# Patient Record
Sex: Female | Born: 1935 | Race: White | Hispanic: No | Smoking: Never smoker
Health system: Southern US, Community
[De-identification: ages and names within clinical notes are randomized; demographics above are authoritative.]

## PROBLEM LIST (undated history)

## (undated) DIAGNOSIS — I10 Essential (primary) hypertension: Secondary | ICD-10-CM

## (undated) DIAGNOSIS — S329XXA Fracture of unspecified parts of lumbosacral spine and pelvis, initial encounter for closed fracture: Secondary | ICD-10-CM

## (undated) DIAGNOSIS — Z90722 Acquired absence of ovaries, bilateral: Secondary | ICD-10-CM

## (undated) DIAGNOSIS — G9340 Encephalopathy, unspecified: Secondary | ICD-10-CM

## (undated) DIAGNOSIS — R159 Full incontinence of feces: Secondary | ICD-10-CM

## (undated) DIAGNOSIS — I639 Cerebral infarction, unspecified: Secondary | ICD-10-CM

## (undated) DIAGNOSIS — K286 Chronic or unspecified gastrojejunal ulcer with both hemorrhage and perforation: Secondary | ICD-10-CM

## (undated) DIAGNOSIS — N210 Calculus in bladder: Secondary | ICD-10-CM

## (undated) DIAGNOSIS — M4800 Spinal stenosis, site unspecified: Secondary | ICD-10-CM

## (undated) DIAGNOSIS — R42 Dizziness and giddiness: Secondary | ICD-10-CM

## (undated) DIAGNOSIS — H9193 Unspecified hearing loss, bilateral: Secondary | ICD-10-CM

## (undated) DIAGNOSIS — R9439 Abnormal result of other cardiovascular function study: Secondary | ICD-10-CM

## (undated) DIAGNOSIS — R4182 Altered mental status, unspecified: Secondary | ICD-10-CM

## (undated) DIAGNOSIS — M87051 Idiopathic aseptic necrosis of right femur: Secondary | ICD-10-CM

## (undated) DIAGNOSIS — Z86010 Personal history of colonic polyps: Secondary | ICD-10-CM

## (undated) DIAGNOSIS — Z90710 Acquired absence of both cervix and uterus: Secondary | ICD-10-CM

## (undated) DIAGNOSIS — N179 Acute kidney failure, unspecified: Secondary | ICD-10-CM

## (undated) DIAGNOSIS — S32599A Other specified fracture of unspecified pubis, initial encounter for closed fracture: Secondary | ICD-10-CM

## (undated) DIAGNOSIS — H8110 Benign paroxysmal vertigo, unspecified ear: Secondary | ICD-10-CM

## (undated) DIAGNOSIS — Z862 Personal history of diseases of the blood and blood-forming organs and certain disorders involving the immune mechanism: Secondary | ICD-10-CM

## (undated) DIAGNOSIS — Z8673 Personal history of transient ischemic attack (TIA), and cerebral infarction without residual deficits: Secondary | ICD-10-CM

## (undated) DIAGNOSIS — M48061 Spinal stenosis, lumbar region without neurogenic claudication: Secondary | ICD-10-CM

## (undated) DIAGNOSIS — G2581 Restless legs syndrome: Secondary | ICD-10-CM

## (undated) DIAGNOSIS — G459 Transient cerebral ischemic attack, unspecified: Secondary | ICD-10-CM

## (undated) DIAGNOSIS — M48062 Spinal stenosis, lumbar region with neurogenic claudication: Secondary | ICD-10-CM

## (undated) DIAGNOSIS — F439 Reaction to severe stress, unspecified: Secondary | ICD-10-CM

## (undated) DIAGNOSIS — K635 Polyp of colon: Secondary | ICD-10-CM

## (undated) DIAGNOSIS — M1990 Unspecified osteoarthritis, unspecified site: Secondary | ICD-10-CM

## (undated) DIAGNOSIS — E876 Hypokalemia: Secondary | ICD-10-CM

## (undated) DIAGNOSIS — D649 Anemia, unspecified: Secondary | ICD-10-CM

## (undated) DIAGNOSIS — F419 Anxiety disorder, unspecified: Secondary | ICD-10-CM

## (undated) DIAGNOSIS — Z9079 Acquired absence of other genital organ(s): Secondary | ICD-10-CM

## (undated) DIAGNOSIS — I350 Nonrheumatic aortic (valve) stenosis: Secondary | ICD-10-CM

## (undated) HISTORY — DX: Nonrheumatic aortic (valve) stenosis: I35.0

## (undated) HISTORY — DX: Personal history of colonic polyps: Z86.010

## (undated) HISTORY — DX: Acquired absence of ovaries, bilateral: Z90.722

## (undated) HISTORY — DX: Transient cerebral ischemic attack, unspecified: G45.9

## (undated) HISTORY — DX: Altered mental status, unspecified: R41.82

## (undated) HISTORY — DX: Fracture of unspecified parts of lumbosacral spine and pelvis, initial encounter for closed fracture: S32.9XXA

## (undated) HISTORY — DX: Abnormal result of other cardiovascular function study: R94.39

## (undated) HISTORY — DX: Other specified fracture of unspecified pubis, initial encounter for closed fracture: S32.599A

## (undated) HISTORY — DX: Dizziness and giddiness: R42

## (undated) HISTORY — DX: Polyp of colon: K63.5

## (undated) HISTORY — DX: Acute kidney failure, unspecified: N17.9

## (undated) HISTORY — DX: Reaction to severe stress, unspecified: F43.9

## (undated) HISTORY — DX: Personal history of transient ischemic attack (TIA), and cerebral infarction without residual deficits: Z86.73

## (undated) HISTORY — DX: Anemia, unspecified: D64.9

## (undated) HISTORY — DX: Acquired absence of other genital organ(s): Z90.79

## (undated) HISTORY — DX: Encephalopathy, unspecified: G93.40

## (undated) HISTORY — DX: Benign paroxysmal vertigo, unspecified ear: H81.10

## (undated) HISTORY — DX: Unspecified hearing loss, bilateral: H91.93

## (undated) HISTORY — DX: Personal history of diseases of the blood and blood-forming organs and certain disorders involving the immune mechanism: Z86.2

## (undated) HISTORY — DX: Acquired absence of both cervix and uterus: Z90.710

## (undated) HISTORY — PX: UTERINE FIBROID SURGERY: SHX826

## (undated) HISTORY — PX: TONSILLECTOMY: SUR1361

## (undated) HISTORY — DX: Anxiety disorder, unspecified: F41.9

## (undated) HISTORY — DX: Hypokalemia: E87.6

## (undated) HISTORY — PX: COLON RESECTION: SHX5231

## (undated) HISTORY — DX: Spinal stenosis, site unspecified: M48.00

## (undated) HISTORY — DX: Idiopathic aseptic necrosis of right femur: M87.051

## (undated) HISTORY — DX: Spinal stenosis, lumbar region with neurogenic claudication: M48.062

## (undated) HISTORY — DX: Spinal stenosis, lumbar region without neurogenic claudication: M48.061

---

## 2007-07-23 ENCOUNTER — Encounter: Admission: RE | Admit: 2007-07-23 | Discharge: 2007-07-23 | Payer: Self-pay | Admitting: Internal Medicine

## 2008-12-01 ENCOUNTER — Encounter: Admission: RE | Admit: 2008-12-01 | Discharge: 2008-12-01 | Payer: Self-pay | Admitting: Orthopaedic Surgery

## 2009-11-13 ENCOUNTER — Ambulatory Visit: Payer: Self-pay | Admitting: Cardiology

## 2009-11-13 ENCOUNTER — Inpatient Hospital Stay (HOSPITAL_COMMUNITY): Admission: EM | Admit: 2009-11-13 | Discharge: 2009-11-15 | Payer: Self-pay | Admitting: Emergency Medicine

## 2009-11-14 ENCOUNTER — Encounter (INDEPENDENT_AMBULATORY_CARE_PROVIDER_SITE_OTHER): Payer: Self-pay | Admitting: Internal Medicine

## 2010-01-03 ENCOUNTER — Encounter: Admission: RE | Admit: 2010-01-03 | Discharge: 2010-01-03 | Payer: Self-pay | Admitting: Internal Medicine

## 2010-08-11 LAB — CK TOTAL AND CKMB (NOT AT ARMC)
CK, MB: 2.5 ng/mL (ref 0.3–4.0)
Relative Index: INVALID (ref 0.0–2.5)
Total CK: 75 U/L (ref 7–177)

## 2010-08-11 LAB — CBC
HCT: 37 % (ref 36.0–46.0)
Hemoglobin: 12.5 g/dL (ref 12.0–15.0)
MCV: 93.9 fL (ref 78.0–100.0)
Platelets: 232 10*3/uL (ref 150–400)
RBC: 3.95 MIL/uL (ref 3.87–5.11)
RDW: 13.7 % (ref 11.5–15.5)
WBC: 9.7 10*3/uL (ref 4.0–10.5)

## 2010-08-11 LAB — PROTIME-INR: INR: 0.97 (ref 0.00–1.49)

## 2010-08-11 LAB — TROPONIN I: Troponin I: 0.02 ng/mL (ref 0.00–0.06)

## 2010-08-11 LAB — DIFFERENTIAL
Basophils Relative: 0 % (ref 0–1)
Monocytes Absolute: 0.4 10*3/uL (ref 0.1–1.0)
Monocytes Relative: 4 % (ref 3–12)
Neutro Abs: 7.8 10*3/uL — ABNORMAL HIGH (ref 1.7–7.7)

## 2010-08-11 LAB — APTT: aPTT: 29 seconds (ref 24–37)

## 2010-08-11 LAB — COMPREHENSIVE METABOLIC PANEL
Alkaline Phosphatase: 126 U/L — ABNORMAL HIGH (ref 39–117)
CO2: 30 mEq/L (ref 19–32)
Chloride: 101 mEq/L (ref 96–112)
GFR calc Af Amer: >60 mL/min (ref >60)
Potassium: 4.5 mEq/L (ref 3.5–5.1)
Sodium: 138 mEq/L (ref 135–145)
Total Bilirubin: 0.5 mg/dL (ref 0.3–1.2)
Total Protein: 6.2 g/dL (ref 6.0–8.3)

## 2010-08-11 LAB — URINALYSIS, ROUTINE W REFLEX MICROSCOPIC
Glucose, UA: NEGATIVE mg/dL
Nitrite: NEGATIVE
Protein, ur: NEGATIVE mg/dL
Specific Gravity, Urine: 1.019 (ref 1.005–1.030)
Urobilinogen, UA: 0.2 mg/dL (ref 0.0–1.0)

## 2010-08-11 LAB — MAGNESIUM: Magnesium: 2.1 mg/dL (ref 1.5–2.5)

## 2010-08-11 LAB — HEMOGLOBIN A1C: Hgb A1c MFr Bld: 6.3 % — ABNORMAL HIGH (ref <5.7)

## 2010-08-11 LAB — LIPID PANEL
Total CHOL/HDL Ratio: 3.7 RATIO
Triglycerides: 139 mg/dL (ref <150)

## 2010-11-20 ENCOUNTER — Emergency Department (HOSPITAL_COMMUNITY)
Admission: EM | Admit: 2010-11-20 | Discharge: 2010-11-20 | Disposition: A | Discharge status: Home / Self Care | Payer: Medicare Other | Attending: Emergency Medicine | Admitting: Emergency Medicine

## 2010-11-20 ENCOUNTER — Emergency Department (HOSPITAL_COMMUNITY): Payer: Medicare Other

## 2010-11-20 DIAGNOSIS — R42 Dizziness and giddiness: Secondary | ICD-10-CM | POA: Insufficient documentation

## 2010-11-20 DIAGNOSIS — R51 Headache: Secondary | ICD-10-CM | POA: Insufficient documentation

## 2010-11-20 DIAGNOSIS — Z8673 Personal history of transient ischemic attack (TIA), and cerebral infarction without residual deficits: Secondary | ICD-10-CM | POA: Insufficient documentation

## 2010-11-20 DIAGNOSIS — Z79899 Other long term (current) drug therapy: Secondary | ICD-10-CM | POA: Insufficient documentation

## 2010-11-20 DIAGNOSIS — I10 Essential (primary) hypertension: Secondary | ICD-10-CM | POA: Insufficient documentation

## 2010-11-20 DIAGNOSIS — H5500 Unspecified nystagmus: Secondary | ICD-10-CM | POA: Insufficient documentation

## 2010-11-20 DIAGNOSIS — Z9889 Other specified postprocedural states: Secondary | ICD-10-CM | POA: Insufficient documentation

## 2010-11-20 LAB — COMPREHENSIVE METABOLIC PANEL
ALT: 15 U/L (ref 0–35)
AST: 16 U/L (ref 0–37)
Albumin: 3.5 g/dL (ref 3.5–5.2)
Alkaline Phosphatase: 85 U/L (ref 39–117)
Glucose, Bld: 105 mg/dL — ABNORMAL HIGH (ref 70–99)
Potassium: 4 mEq/L (ref 3.5–5.1)
Sodium: 141 mEq/L (ref 135–145)
Total Protein: 6.2 g/dL (ref 6.0–8.3)

## 2010-11-20 LAB — URINALYSIS, ROUTINE W REFLEX MICROSCOPIC
Bilirubin Urine: NEGATIVE
Glucose, UA: NEGATIVE mg/dL
Hgb urine dipstick: NEGATIVE
Leukocytes, UA: NEGATIVE
Nitrite: NEGATIVE
Specific Gravity, Urine: 1.009 (ref 1.005–1.030)
pH: 7.5 (ref 5.0–8.0)

## 2010-11-20 LAB — CBC
MCHC: 33.4 g/dL (ref 30.0–36.0)
MCV: 88 fL (ref 78.0–100.0)
RBC: 4.32 MIL/uL (ref 3.87–5.11)
RDW: 13.6 % (ref 11.5–15.5)
WBC: 6.4 10*3/uL (ref 4.0–10.5)

## 2010-11-20 LAB — DIFFERENTIAL
Basophils Relative: 1 % (ref 0–1)
Eosinophils Relative: 3 % (ref 0–5)
Monocytes Absolute: 0.5 10*3/uL (ref 0.1–1.0)
Monocytes Relative: 8 % (ref 3–12)

## 2010-11-20 LAB — CK TOTAL AND CKMB (NOT AT ARMC)
CK, MB: 3.1 ng/mL (ref 0.3–4.0)
Relative Index: INVALID (ref 0.0–2.5)

## 2010-11-20 LAB — TROPONIN I: Troponin I: <0.3 ng/mL (ref <0.30)

## 2010-11-21 LAB — URINE CULTURE
Colony Count: NO GROWTH
Culture: NO GROWTH

## 2011-01-21 ENCOUNTER — Other Ambulatory Visit: Payer: Self-pay | Admitting: Neurosurgery

## 2011-01-21 DIAGNOSIS — IMO0002 Reserved for concepts with insufficient information to code with codable children: Secondary | ICD-10-CM

## 2011-01-21 DIAGNOSIS — M48061 Spinal stenosis, lumbar region without neurogenic claudication: Secondary | ICD-10-CM

## 2011-01-21 DIAGNOSIS — M479 Spondylosis, unspecified: Secondary | ICD-10-CM

## 2011-01-21 DIAGNOSIS — M792 Neuralgia and neuritis, unspecified: Secondary | ICD-10-CM

## 2011-01-21 DIAGNOSIS — M545 Low back pain: Secondary | ICD-10-CM

## 2011-01-26 ENCOUNTER — Ambulatory Visit
Admission: RE | Admit: 2011-01-26 | Discharge: 2011-01-26 | Disposition: A | Discharge status: Home / Self Care | Payer: Medicare Other | Source: Ambulatory Visit | Attending: Neurosurgery | Admitting: Neurosurgery

## 2011-01-26 DIAGNOSIS — M545 Low back pain, unspecified: Secondary | ICD-10-CM

## 2011-01-26 DIAGNOSIS — M792 Neuralgia and neuritis, unspecified: Secondary | ICD-10-CM

## 2011-01-26 DIAGNOSIS — M479 Spondylosis, unspecified: Secondary | ICD-10-CM

## 2011-01-26 DIAGNOSIS — M48061 Spinal stenosis, lumbar region without neurogenic claudication: Secondary | ICD-10-CM

## 2011-01-26 DIAGNOSIS — IMO0002 Reserved for concepts with insufficient information to code with codable children: Secondary | ICD-10-CM

## 2011-01-30 ENCOUNTER — Other Ambulatory Visit: Payer: Medicare Other

## 2011-02-05 ENCOUNTER — Other Ambulatory Visit: Payer: Self-pay | Admitting: Internal Medicine

## 2011-02-06 ENCOUNTER — Other Ambulatory Visit: Payer: Medicare Other

## 2011-02-06 ENCOUNTER — Ambulatory Visit
Admission: RE | Admit: 2011-02-06 | Discharge: 2011-02-06 | Disposition: A | Discharge status: Home / Self Care | Payer: Medicare Other | Source: Ambulatory Visit | Attending: Internal Medicine | Admitting: Internal Medicine

## 2011-02-06 MED ORDER — IOHEXOL 300 MG/ML  SOLN
100.0000 mL | Freq: Once | INTRAMUSCULAR | Status: AC | PRN
  Administered 2011-02-06: 100 mL via INTRAVENOUS

## 2011-06-02 ENCOUNTER — Other Ambulatory Visit: Payer: Self-pay | Admitting: Neurosurgery

## 2011-06-02 DIAGNOSIS — M47817 Spondylosis without myelopathy or radiculopathy, lumbosacral region: Secondary | ICD-10-CM

## 2011-06-02 DIAGNOSIS — M48061 Spinal stenosis, lumbar region without neurogenic claudication: Secondary | ICD-10-CM

## 2011-06-02 DIAGNOSIS — M4310 Spondylolisthesis, site unspecified: Secondary | ICD-10-CM

## 2011-06-05 ENCOUNTER — Ambulatory Visit
Admission: RE | Admit: 2011-06-05 | Discharge: 2011-06-05 | Disposition: A | Discharge status: Home / Self Care | Payer: Medicare Other | Source: Ambulatory Visit | Attending: Neurosurgery | Admitting: Neurosurgery

## 2011-06-05 DIAGNOSIS — M48061 Spinal stenosis, lumbar region without neurogenic claudication: Secondary | ICD-10-CM

## 2011-06-05 DIAGNOSIS — M47817 Spondylosis without myelopathy or radiculopathy, lumbosacral region: Secondary | ICD-10-CM

## 2011-06-05 DIAGNOSIS — M4310 Spondylolisthesis, site unspecified: Secondary | ICD-10-CM

## 2011-06-07 ENCOUNTER — Ambulatory Visit (INDEPENDENT_AMBULATORY_CARE_PROVIDER_SITE_OTHER): Payer: Medicare Other

## 2011-06-07 DIAGNOSIS — N390 Urinary tract infection, site not specified: Secondary | ICD-10-CM

## 2011-06-17 ENCOUNTER — Encounter (HOSPITAL_COMMUNITY): Payer: Self-pay | Admitting: Respiratory Therapy

## 2011-06-17 ENCOUNTER — Other Ambulatory Visit: Payer: Self-pay | Admitting: Neurosurgery

## 2011-06-20 ENCOUNTER — Encounter (HOSPITAL_COMMUNITY)
Admission: RE | Admit: 2011-06-20 | Discharge: 2011-06-20 | Disposition: A | Discharge status: Home / Self Care | Payer: Medicare Other | Source: Ambulatory Visit | Attending: Neurosurgery | Admitting: Neurosurgery

## 2011-06-20 ENCOUNTER — Other Ambulatory Visit: Payer: Self-pay

## 2011-06-20 ENCOUNTER — Encounter (HOSPITAL_COMMUNITY): Payer: Self-pay

## 2011-06-20 ENCOUNTER — Ambulatory Visit (HOSPITAL_COMMUNITY)
Admission: RE | Admit: 2011-06-20 | Discharge: 2011-06-20 | Disposition: A | Discharge status: Home / Self Care | Payer: Medicare Other | Source: Ambulatory Visit | Attending: Anesthesiology | Admitting: Anesthesiology

## 2011-06-20 DIAGNOSIS — Z01810 Encounter for preprocedural cardiovascular examination: Secondary | ICD-10-CM | POA: Insufficient documentation

## 2011-06-20 DIAGNOSIS — Z01818 Encounter for other preprocedural examination: Secondary | ICD-10-CM | POA: Insufficient documentation

## 2011-06-20 DIAGNOSIS — Z01812 Encounter for preprocedural laboratory examination: Secondary | ICD-10-CM | POA: Insufficient documentation

## 2011-06-20 HISTORY — DX: Chronic or unspecified gastrojejunal ulcer with both hemorrhage and perforation: K28.6

## 2011-06-20 HISTORY — DX: Restless legs syndrome: G25.81

## 2011-06-20 HISTORY — DX: Essential (primary) hypertension: I10

## 2011-06-20 HISTORY — DX: Cerebral infarction, unspecified: I63.9

## 2011-06-20 HISTORY — DX: Unspecified osteoarthritis, unspecified site: M19.90

## 2011-06-20 HISTORY — DX: Full incontinence of feces: R15.9

## 2011-06-20 HISTORY — DX: Calculus in bladder: N21.0

## 2011-06-20 LAB — CBC
HCT: 40.7 % (ref 36.0–46.0)
Hemoglobin: 13.6 g/dL (ref 12.0–15.0)
MCHC: 33.4 g/dL (ref 30.0–36.0)
RBC: 4.41 MIL/uL (ref 3.87–5.11)
WBC: 12 10*3/uL — ABNORMAL HIGH (ref 4.0–10.5)

## 2011-06-20 LAB — SURGICAL PCR SCREEN
MRSA, PCR: NEGATIVE
Staphylococcus aureus: NEGATIVE

## 2011-06-20 LAB — BASIC METABOLIC PANEL
BUN: 28 mg/dL — ABNORMAL HIGH (ref 6–23)
Calcium: 9.1 mg/dL (ref 8.4–10.5)
Creatinine, Ser: 0.75 mg/dL (ref 0.50–1.10)
GFR calc Af Amer: 83 mL/min — ABNORMAL LOW (ref >90)

## 2011-06-20 NOTE — Pre-Procedure Instructions (Signed)
20 Shelly Martin  06/20/2011   Your procedure is scheduled on:  Jaunary 30th   Report to [facility] at 1:30  PM.  Call this number if you have problems the morning of surgery: [phone]   Remember:   Do not eat food:After Midnight.  May have clear liquids: up to 4 Hours before arrival.  Clear liquids include soda, tea, black coffee, apple or grape juice, broth.  Take these medicines the morning of surgery with A SIP OF WATER:  Amolodipine (Norvasc), Acetaminohen.   Do not wear jewelry, make-up or nail polish.  Do not wear lotions, powders, or perfumes. You may wear deodorant.  Do not shave 48 hours prior to surgery.  Do not bring valuables to the hospital.  Contacts, dentures or bridgework may not be worn into surgery.  Leave suitcase in the car. After surgery it may be brought to your room.  For patients admitted to the hospital, checkout time is 11:00 AM the day of discharge.   Patients discharged the day of surgery will not be allowed to drive home.  Name and phone number of your driver: ---  Special Instructions: CHG Shower Use Special Wash: 1/2 bottle night before surgery and 1/2 bottle morning of surgery.   Please read over the following fact sheets that you were given: Pain Booklet, Coughing and Deep Breathing, MRSA Information and Surgical Site Infection Prevention

## 2011-06-23 NOTE — Consult Note (Addendum)
Anesthesia:  Patient is a 78 year old female scheduled for a right L 2-3 hemilaminectomy and microdiskectomy on 06/25/11.  Her history includes HTN, GIB, arthritis, bowel and bladder incontinence, colon resection, RLS, and TIA 10/2009.   Labs noted.  CXR findings showed cardiomediastinal silhouette is stable.  No acute infiltrate or pleural effusion. No pulmonary edema. Mild degenerative changes right shoulder. Mild degenerative changes mid thoracic spine. Stable central chronic mild bronchitic changes.  EKG from 06/20/11 shows NSR, possible LAE, LAFB, LVH.  Overall, it appears stable since 11/20/10.    Echo on 11/14/09 showed: "- Left ventricle: The cavity size was normal. Wall thickness was normal. Systolic function was normal. The estimated ejection fraction was in the range of 60% to 65%. Wall motion was normal; there were no regional wall motion abnormalities. Doppler parameters are consistent with abnormal left ventricular relaxation (grade 1 diastolic dysfunction). - Aortic valve: There was mild stenosis. Valve area: 1.06cm^2(VTI).Valve area: 1.05cm^2 (Vmax). - Mitral valve: Calcified annulus. Valve area by pressure half-time: 1.96cm^2. Valve area by continuity equation (using LVOT flow): 1.06cm^2. - Pulmonary arteries: Systolic pressure was mildly increased."  Carotid duplex on 11/13/09 showed no significant extracranial carotid artery stenosis, vertebrals patent with antegrade flow.  I received her last OV from Dr. Nehemiah Settle which was on 02/21/12 and was for f/u for constipation.  I reviewed her history, EKG, and echo findings with Anesthesiologist Dr. Noreene Larsson.  He is going to contact Dr. Newell Coral to discuss case further and potential need for pre-operative Cardiology evaluation d/t AS, abnormal EKG, and age.  Addendum:  06/24/11 1230  Dr. Noreene Larsson spoke with Dr. Jule Ser.  He has discussed potential operative risks with the patient, and reportedly she has accepted these risks and has refused  further Cardiac evaluation.  At this point, the plan is to proceed.  Dr. Noreene Larsson to update her assigned Anesthesiologist who will evaluate patient on the day of surgery, and discuss the definitive anesthesia plan at that time.

## 2011-06-24 MED ORDER — CEFAZOLIN SODIUM 1-5 GM-% IV SOLN
1.0000 g | INTRAVENOUS | Status: AC
  Administered 2011-06-25: 1 g via INTRAVENOUS
  Filled 2011-06-24: qty 50

## 2011-06-25 ENCOUNTER — Ambulatory Visit (HOSPITAL_COMMUNITY): Payer: Medicare Other

## 2011-06-25 ENCOUNTER — Inpatient Hospital Stay (HOSPITAL_COMMUNITY)
Admission: RE | Admit: 2011-06-25 | Discharge: 2011-06-26 | DRG: 491 | Disposition: A | Discharge status: Home / Self Care | Payer: Medicare Other | Source: Ambulatory Visit | Attending: Neurosurgery | Admitting: Neurosurgery

## 2011-06-25 ENCOUNTER — Encounter (HOSPITAL_COMMUNITY): Payer: Self-pay | Admitting: *Deleted

## 2011-06-25 ENCOUNTER — Encounter (HOSPITAL_COMMUNITY): Payer: Self-pay | Admitting: Vascular Surgery

## 2011-06-25 ENCOUNTER — Ambulatory Visit (HOSPITAL_COMMUNITY): Payer: Medicare Other | Admitting: Vascular Surgery

## 2011-06-25 ENCOUNTER — Encounter (HOSPITAL_COMMUNITY)
Admission: RE | Disposition: A | Discharge status: Home / Self Care | Payer: Self-pay | Source: Ambulatory Visit | Attending: Neurosurgery

## 2011-06-25 DIAGNOSIS — K5900 Constipation, unspecified: Secondary | ICD-10-CM | POA: Diagnosis present

## 2011-06-25 DIAGNOSIS — Z8673 Personal history of transient ischemic attack (TIA), and cerebral infarction without residual deficits: Secondary | ICD-10-CM

## 2011-06-25 DIAGNOSIS — G2581 Restless legs syndrome: Secondary | ICD-10-CM | POA: Diagnosis present

## 2011-06-25 DIAGNOSIS — I10 Essential (primary) hypertension: Secondary | ICD-10-CM | POA: Diagnosis present

## 2011-06-25 DIAGNOSIS — Z79899 Other long term (current) drug therapy: Secondary | ICD-10-CM

## 2011-06-25 DIAGNOSIS — M51379 Other intervertebral disc degeneration, lumbosacral region without mention of lumbar back pain or lower extremity pain: Secondary | ICD-10-CM | POA: Diagnosis present

## 2011-06-25 DIAGNOSIS — Z7982 Long term (current) use of aspirin: Secondary | ICD-10-CM

## 2011-06-25 DIAGNOSIS — M5126 Other intervertebral disc displacement, lumbar region: Principal | ICD-10-CM | POA: Diagnosis present

## 2011-06-25 DIAGNOSIS — M5137 Other intervertebral disc degeneration, lumbosacral region: Secondary | ICD-10-CM | POA: Diagnosis present

## 2011-06-25 DIAGNOSIS — M47817 Spondylosis without myelopathy or radiculopathy, lumbosacral region: Secondary | ICD-10-CM | POA: Diagnosis present

## 2011-06-25 HISTORY — PX: LUMBAR LAMINECTOMY/DECOMPRESSION MICRODISCECTOMY: SHX5026

## 2011-06-25 SURGERY — LUMBAR LAMINECTOMY/DECOMPRESSION MICRODISCECTOMY
Anesthesia: General | Site: Spine Lumbar | Laterality: Right | Wound class: Clean

## 2011-06-25 MED ORDER — KCL IN DEXTROSE-NACL 20-5-0.45 MEQ/L-%-% IV SOLN
INTRAVENOUS | Status: DC
  Administered 2011-06-26: 01:00:00 via INTRAVENOUS
  Filled 2011-06-25 (×4): qty 1000

## 2011-06-25 MED ORDER — FENTANYL CITRATE 0.05 MG/ML IJ SOLN
INTRAMUSCULAR | Status: DC | PRN
  Administered 2011-06-25: 100 ug via INTRAVENOUS
  Administered 2011-06-25 (×2): 50 ug via INTRAVENOUS

## 2011-06-25 MED ORDER — LACTATED RINGERS IV SOLN
INTRAVENOUS | Status: DC | PRN
  Administered 2011-06-25: 20:00:00 via INTRAVENOUS

## 2011-06-25 MED ORDER — MORPHINE SULFATE 4 MG/ML IJ SOLN: 4.0000 mg | INTRAMUSCULAR | Status: DC | PRN

## 2011-06-25 MED ORDER — HEMOSTATIC AGENTS (NO CHARGE) OPTIME
TOPICAL | Status: DC | PRN
  Administered 2011-06-25: 1 via TOPICAL

## 2011-06-25 MED ORDER — THROMBIN 5000 UNITS EX SOLR
CUTANEOUS | Status: DC | PRN
  Administered 2011-06-25 (×2): 5000 [IU] via TOPICAL

## 2011-06-25 MED ORDER — NEOSTIGMINE METHYLSULFATE 1 MG/ML IJ SOLN
INTRAMUSCULAR | Status: DC | PRN
  Administered 2011-06-25: 4 mg via INTRAVENOUS

## 2011-06-25 MED ORDER — HYDROMORPHONE HCL PF 1 MG/ML IJ SOLN
0.2500 mg | INTRAMUSCULAR | Status: DC | PRN
  Administered 2011-06-25: 0.5 mg via INTRAVENOUS

## 2011-06-25 MED ORDER — LIDOCAINE-EPINEPHRINE 1 %-1:100000 IJ SOLN
INTRAMUSCULAR | Status: DC | PRN
  Administered 2011-06-25: 20 mL

## 2011-06-25 MED ORDER — PHENYLEPHRINE HCL 10 MG/ML IJ SOLN
INTRAMUSCULAR | Status: DC | PRN
  Administered 2011-06-25: 80 ug via INTRAVENOUS

## 2011-06-25 MED ORDER — PROPOFOL 10 MG/ML IV BOLUS
INTRAVENOUS | Status: DC | PRN
  Administered 2011-06-25: 100 mg via INTRAVENOUS

## 2011-06-25 MED ORDER — KETOROLAC TROMETHAMINE 30 MG/ML IJ SOLN: 30.0000 mg | Freq: Four times a day (QID) | INTRAMUSCULAR | Status: DC

## 2011-06-25 MED ORDER — ONDANSETRON HCL 4 MG/2ML IJ SOLN
4.0000 mg | Freq: Once | INTRAMUSCULAR | Status: AC | PRN
  Administered 2011-06-25: 4 mg via INTRAVENOUS

## 2011-06-25 MED ORDER — ROPINIROLE HCL 1 MG PO TABS
2.0000 mg | ORAL_TABLET | Freq: Every day | ORAL | Status: DC
  Filled 2011-06-25 (×2): qty 2

## 2011-06-25 MED ORDER — FENTANYL CITRATE 0.05 MG/ML IJ SOLN
INTRAMUSCULAR | Status: DC | PRN
  Administered 2011-06-25: 25 ug via INTRAVENOUS

## 2011-06-25 MED ORDER — ONDANSETRON HCL 4 MG/2ML IJ SOLN
INTRAMUSCULAR | Status: AC
  Administered 2011-06-25: 4 mg via INTRAVENOUS
  Filled 2011-06-25: qty 2

## 2011-06-25 MED ORDER — KETOROLAC TROMETHAMINE 30 MG/ML IJ SOLN
INTRAMUSCULAR | Status: AC
  Administered 2011-06-25: 30 mg via INTRAVENOUS
  Filled 2011-06-25: qty 1

## 2011-06-25 MED ORDER — ZOLPIDEM TARTRATE 5 MG PO TABS: 5.0000 mg | ORAL_TABLET | Freq: Every evening | ORAL | Status: DC | PRN

## 2011-06-25 MED ORDER — KETOROLAC TROMETHAMINE 30 MG/ML IJ SOLN
30.0000 mg | Freq: Once | INTRAMUSCULAR | Status: AC
  Administered 2011-06-25: 30 mg via INTRAVENOUS

## 2011-06-25 MED ORDER — CYCLOBENZAPRINE HCL 10 MG PO TABS: 10.0000 mg | ORAL_TABLET | Freq: Three times a day (TID) | ORAL | Status: DC | PRN

## 2011-06-25 MED ORDER — AMLODIPINE BESYLATE 5 MG PO TABS
5.0000 mg | ORAL_TABLET | Freq: Every day | ORAL | Status: DC
  Administered 2011-06-26: 5 mg via ORAL
  Filled 2011-06-25: qty 1

## 2011-06-25 MED ORDER — BUPIVACAINE HCL (PF) 0.5 % IJ SOLN
INTRAMUSCULAR | Status: DC | PRN
  Administered 2011-06-25: 30 mL

## 2011-06-25 MED ORDER — HYDROCODONE-ACETAMINOPHEN 5-325 MG PO TABS
1.0000 | ORAL_TABLET | ORAL | Status: DC | PRN
  Administered 2011-06-26 (×2): 1 via ORAL
  Filled 2011-06-25 (×2): qty 1

## 2011-06-25 MED ORDER — ALUM & MAG HYDROXIDE-SIMETH 200-200-20 MG/5ML PO SUSP: 30.0000 mL | Freq: Four times a day (QID) | ORAL | Status: DC | PRN

## 2011-06-25 MED ORDER — MAGNESIUM HYDROXIDE 400 MG/5ML PO SUSP: 30.0000 mL | Freq: Every day | ORAL | Status: DC | PRN

## 2011-06-25 MED ORDER — HYDROXYZINE HCL 25 MG PO TABS: 50.0000 mg | ORAL_TABLET | ORAL | Status: DC | PRN

## 2011-06-25 MED ORDER — 0.9 % SODIUM CHLORIDE (POUR BTL) OPTIME
TOPICAL | Status: DC | PRN
  Administered 2011-06-25: 1000 mL

## 2011-06-25 MED ORDER — ONDANSETRON HCL 4 MG/2ML IJ SOLN
INTRAMUSCULAR | Status: DC | PRN
  Administered 2011-06-25: 4 mg via INTRAVENOUS

## 2011-06-25 MED ORDER — MENTHOL 3 MG MT LOZG: 1.0000 | LOZENGE | OROMUCOSAL | Status: DC | PRN

## 2011-06-25 MED ORDER — OXYCODONE-ACETAMINOPHEN 5-325 MG PO TABS: 1.0000 | ORAL_TABLET | ORAL | Status: DC | PRN

## 2011-06-25 MED ORDER — SODIUM CHLORIDE 0.9 % IV SOLN: 250.0000 mL | INTRAVENOUS | Status: DC

## 2011-06-25 MED ORDER — GLYCOPYRROLATE 0.2 MG/ML IJ SOLN
INTRAMUSCULAR | Status: DC | PRN
  Administered 2011-06-25: .5 mg via INTRAVENOUS

## 2011-06-25 MED ORDER — FENTANYL CITRATE 0.05 MG/ML IJ SOLN
INTRAMUSCULAR | Status: AC
  Filled 2011-06-25: qty 2

## 2011-06-25 MED ORDER — BISACODYL 10 MG RE SUPP: 10.0000 mg | Freq: Every day | RECTAL | Status: DC | PRN

## 2011-06-25 MED ORDER — SODIUM CHLORIDE 0.9 % IJ SOLN: 3.0000 mL | INTRAMUSCULAR | Status: DC | PRN

## 2011-06-25 MED ORDER — HYDROMORPHONE HCL PF 1 MG/ML IJ SOLN
INTRAMUSCULAR | Status: AC
  Administered 2011-06-25: 0.5 mg via INTRAVENOUS
  Filled 2011-06-25: qty 1

## 2011-06-25 MED ORDER — POLYETHYLENE GLYCOL 3350 17 G PO PACK
17.0000 g | PACK | Freq: Every day | ORAL | Status: DC
  Administered 2011-06-26: 17 g via ORAL
  Filled 2011-06-25 (×2): qty 1

## 2011-06-25 MED ORDER — SODIUM CHLORIDE 0.9 % IR SOLN
Status: DC | PRN
  Administered 2011-06-25: 21:00:00

## 2011-06-25 MED ORDER — METHYLPREDNISOLONE ACETATE PF 80 MG/ML IJ SUSP
INTRAMUSCULAR | Status: DC | PRN
  Administered 2011-06-25: 80 mg

## 2011-06-25 MED ORDER — ACETAMINOPHEN 325 MG PO TABS: 650.0000 mg | ORAL_TABLET | ORAL | Status: DC | PRN

## 2011-06-25 MED ORDER — BACITRACIN 50000 UNITS IM SOLR
INTRAMUSCULAR | Status: AC
  Filled 2011-06-25: qty 1

## 2011-06-25 MED ORDER — SODIUM CHLORIDE 0.9 % IV SOLN
INTRAVENOUS | Status: AC
  Filled 2011-06-25: qty 500

## 2011-06-25 MED ORDER — HYDROXYZINE HCL 50 MG/ML IM SOLN: 50.0000 mg | INTRAMUSCULAR | Status: DC | PRN

## 2011-06-25 MED ORDER — SODIUM CHLORIDE 0.9 % IJ SOLN
3.0000 mL | Freq: Two times a day (BID) | INTRAMUSCULAR | Status: DC
  Administered 2011-06-26: 3 mL via INTRAVENOUS

## 2011-06-25 MED ORDER — ACETAMINOPHEN 650 MG RE SUPP: 650.0000 mg | RECTAL | Status: DC | PRN

## 2011-06-25 MED ORDER — PHENOL 1.4 % MT LIQD: 1.0000 | OROMUCOSAL | Status: DC | PRN

## 2011-06-25 MED ORDER — ROCURONIUM BROMIDE 100 MG/10ML IV SOLN
INTRAVENOUS | Status: DC | PRN
  Administered 2011-06-25: 35 mg via INTRAVENOUS

## 2011-06-25 SURGICAL SUPPLY — 61 items
BAG DECANTER FOR FLEXI CONT (MISCELLANEOUS) ×2 IMPLANT
BENZOIN TINCTURE PRP APPL 2/3 (GAUZE/BANDAGES/DRESSINGS) IMPLANT
BLADE SURG ROTATE 9660 (MISCELLANEOUS) IMPLANT
BRUSH SCRUB EZ PLAIN DRY (MISCELLANEOUS) ×2 IMPLANT
BUR ACORN 6.0 ACORN (BURR) ×2 IMPLANT
BUR ACRON 5.0MM COATED (BURR) IMPLANT
BUR MATCHSTICK NEURO 3.0 LAGG (BURR) ×2 IMPLANT
CANISTER SUCTION 2500CC (MISCELLANEOUS) ×2 IMPLANT
CLOTH BEACON ORANGE TIMEOUT ST (SAFETY) ×2 IMPLANT
CONT SPEC 4OZ CLIKSEAL STRL BL (MISCELLANEOUS) ×2 IMPLANT
DERMABOND ADHESIVE PROPEN (GAUZE/BANDAGES/DRESSINGS) ×1
DERMABOND ADVANCED (GAUZE/BANDAGES/DRESSINGS)
DERMABOND ADVANCED .7 DNX12 (GAUZE/BANDAGES/DRESSINGS) IMPLANT
DERMABOND ADVANCED .7 DNX6 (GAUZE/BANDAGES/DRESSINGS) ×1 IMPLANT
DRAPE LAPAROTOMY 100X72X124 (DRAPES) ×2 IMPLANT
DRAPE MICROSCOPE LEICA (MISCELLANEOUS) ×2 IMPLANT
DRAPE POUCH INSTRU U-SHP 10X18 (DRAPES) ×2 IMPLANT
DRSG EMULSION OIL 3X3 NADH (GAUZE/BANDAGES/DRESSINGS) IMPLANT
ELECT REM PT RETURN 9FT ADLT (ELECTROSURGICAL) ×2
ELECTRODE REM PT RTRN 9FT ADLT (ELECTROSURGICAL) ×1 IMPLANT
GAUZE SPONGE 4X4 16PLY XRAY LF (GAUZE/BANDAGES/DRESSINGS) IMPLANT
GLOVE BIO SURGEON STRL SZ 6.5 (GLOVE) ×4 IMPLANT
GLOVE BIOGEL PI IND STRL 7.0 (GLOVE) ×3 IMPLANT
GLOVE BIOGEL PI IND STRL 8 (GLOVE) ×1 IMPLANT
GLOVE BIOGEL PI INDICATOR 7.0 (GLOVE) ×3
GLOVE BIOGEL PI INDICATOR 8 (GLOVE) ×1
GLOVE ECLIPSE 6.5 STRL STRAW (GLOVE) ×2 IMPLANT
GLOVE ECLIPSE 7.5 STRL STRAW (GLOVE) ×8 IMPLANT
GLOVE EXAM NITRILE LRG STRL (GLOVE) IMPLANT
GLOVE EXAM NITRILE MD LF STRL (GLOVE) IMPLANT
GLOVE EXAM NITRILE XL STR (GLOVE) IMPLANT
GLOVE EXAM NITRILE XS STR PU (GLOVE) IMPLANT
GOWN BRE IMP SLV AUR LG STRL (GOWN DISPOSABLE) ×2 IMPLANT
GOWN BRE IMP SLV AUR XL STRL (GOWN DISPOSABLE) ×8 IMPLANT
GOWN STRL REIN 2XL LVL4 (GOWN DISPOSABLE) IMPLANT
KIT BASIN OR (CUSTOM PROCEDURE TRAY) ×2 IMPLANT
KIT ROOM TURNOVER OR (KITS) ×2 IMPLANT
NEEDLE HYPO 18GX1.5 BLUNT FILL (NEEDLE) ×2 IMPLANT
NEEDLE SPNL 18GX3.5 QUINCKE PK (NEEDLE) ×2 IMPLANT
NEEDLE SPNL 22GX3.5 QUINCKE BK (NEEDLE) ×2 IMPLANT
NS IRRIG 1000ML POUR BTL (IV SOLUTION) ×2 IMPLANT
PACK LAMINECTOMY NEURO (CUSTOM PROCEDURE TRAY) ×2 IMPLANT
PAD ARMBOARD 7.5X6 YLW CONV (MISCELLANEOUS) ×6 IMPLANT
PATTIES SURGICAL .5 X1 (DISPOSABLE) ×2 IMPLANT
RUBBERBAND STERILE (MISCELLANEOUS) ×4 IMPLANT
SPONGE GAUZE 4X4 12PLY (GAUZE/BANDAGES/DRESSINGS) ×2 IMPLANT
SPONGE LAP 4X18 X RAY DECT (DISPOSABLE) IMPLANT
SPONGE SURGIFOAM ABS GEL SZ50 (HEMOSTASIS) ×2 IMPLANT
STRIP CLOSURE SKIN 1/2X4 (GAUZE/BANDAGES/DRESSINGS) IMPLANT
SUT PROLENE 6 0 BV (SUTURE) IMPLANT
SUT VIC AB 1 CT1 18XBRD ANBCTR (SUTURE) ×1 IMPLANT
SUT VIC AB 1 CT1 8-18 (SUTURE) ×1
SUT VIC AB 2-0 CP2 18 (SUTURE) ×2 IMPLANT
SUT VIC AB 3-0 SH 8-18 (SUTURE) ×2 IMPLANT
SYR 20CC LL (SYRINGE) ×2 IMPLANT
SYR 3ML LL SCALE MARK (SYRINGE) ×4 IMPLANT
SYR 5ML LL (SYRINGE) IMPLANT
TAPE CLOTH SURG 4X10 WHT LF (GAUZE/BANDAGES/DRESSINGS) ×2 IMPLANT
TOWEL OR 17X24 6PK STRL BLUE (TOWEL DISPOSABLE) ×2 IMPLANT
TOWEL OR 17X26 10 PK STRL BLUE (TOWEL DISPOSABLE) ×2 IMPLANT
WATER STERILE IRR 1000ML POUR (IV SOLUTION) ×2 IMPLANT

## 2011-06-25 NOTE — Preoperative (Signed)
Beta Blockers   Reason not to administer Beta Blockers:Not Applicable 

## 2011-06-25 NOTE — Op Note (Signed)
06/25/2011  9:46 PM  PATIENT:  Shelly Martin  78 y.o. female  PRE-OPERATIVE DIAGNOSIS:  lumbar hernaited disc  POST-OPERATIVE DIAGNOSIS:  lumbar hernaited disc  PROCEDURE:  Procedure(s): LUMBAR LAMINECTOMY/DECOMPRESSION MICRODISCECTOMY: Right L3 hemilaminectomy and microdiscectomy with microdissection microsurgical technique and the operating microscope  SURGEON:  Surgeon(s): Hewitt Shorts, MD Carmela Hurt, MD  ASSISTANTS: Mena Goes. Franky Macho, M.D.  ANESTHESIA:   general  EBL:  75 cc  COUNT: Correct per nursing staff  DICTATION: Patient was brought to the operating room and placed under general endotracheal anesthesia. Patient was turned to prone position the lumbar region was prepped with Betadine soap and solution and draped in a sterile fashion. The midline was infiltrated with local anesthetic with epinephrine. A localizing x-ray was taken and the L3 level was identified. Midline incision was made over the L3 level and was carried down through the subcutaneous tissue to the lumbar fascia. The lumbar fascia was incised on the right side and the paraspinal muscles were dissected from the spinous processes and lamina in a subperiosteal fashion. Another x-ray was taken and the L2-3 and L3-4 intralaminar spaces were identified. Laminotomy was performed using the high-speed drill and Kerrison punches. The ligamentum flavum was carefully resected. The underlying thecal sac and nerve root were identified. The disc herniation was identified. The disc herniation was seen compressing the exiting right L3 nerve root as well as the lateral aspect of the thecal sac. There was a free fragment that was removed in a piecemeal fashion, decompressing the exiting right L3 nerve root and thecal sac. The disc herniation clearly arose from the L3-4 disc space level, and we entered into the disc space and performed a thorough discectomy removing all loose fragments of disc material from both the epidural  space and the disc space.good decompression of the thecal sac and exiting right L3 nerve root was achieved.  Once the discectomy was completed and good decompression of the thecal sac and nerve had been achieved hemostasis was established with the use of bipolar cautery and Gelfoam with thrombin. The Gelfoam was removed and hemostasis confirmed. We then instilled 1 cc of fentanyl and 80 mg of Depo-Medrol into the epidural space. Deep fascia was closed with interrupted undyed 1 Vicryl sutures. Scarpa's fascia was closed with interrupted undyed 1 Vicryl sutures in the subcutaneous and subcuticular layer were closed with interrupted inverted 2-0 undyed Vicryl sutures. The skin edges were approximated with Dermabond.  sterile gauze and 4 inch Hypafix was applied as a dressing. Following surgery the patient was turned back to a supine position to be reversed from the anesthetic extubated and transferred to the recovery room for further care.   PLAN OF CARE: Admit to inpatient   PATIENT DISPOSITION:  PACU - hemodynamically stable.   Delay start of Pharmacological VTE agent (>24hrs) due to surgical blood loss or risk of bleeding:  yes

## 2011-06-25 NOTE — Progress Notes (Signed)
Filed Vitals:   06/25/11 1346  BP: 133/81  Pulse: 75  Temp: 98.1 F (36.7 C)  TempSrc: Oral  Resp: 18  SpO2: 95%    Patient resting comfortably in PACU. Awake and following commands. Dressing clean and dry. Moving all extremities well. No void yet.  Plan: After patient stabilizes and PACU, she is to be transferred to the 3500 unit, where we will continue to progress her through her recovery.  Hewitt Shorts, MD 06/25/2011, 10:04 PM

## 2011-06-25 NOTE — Transfer of Care (Signed)
Immediate Anesthesia Transfer of Care Note  Patient: Shelly Martin  Procedure(s) Performed:  LUMBAR LAMINECTOMY/DECOMPRESSION MICRODISCECTOMY - RIGHT Lumbar Two-Three hemilaminectomy and microdiskectomy  Patient Location: PACU  Anesthesia Type: General  Level of Consciousness: awake and alert   Airway & Oxygen Therapy: Patient Spontanous Breathing and Patient connected to face mask oxygen  Post-op Assessment: Report given to PACU RN, Post -op Vital signs reviewed and stable and Patient moving all extremities X 4  Post vital signs: Reviewed and stable  Complications: No apparent anesthesia complications

## 2011-06-25 NOTE — Anesthesia Preprocedure Evaluation (Addendum)
Anesthesia Evaluation  Patient identified by MRN, date of birth, ID band Patient awake    Reviewed: Allergy & Precautions, H&P , NPO status , Patient's Chart, lab work & pertinent test results  History of Anesthesia Complications Negative for: history of anesthetic complications  Airway Mallampati: II TM Distance: >3 FB Neck ROM: Full    Dental No notable dental hx. (+) Teeth Intact   Pulmonary neg pulmonary ROS,  clear to auscultation  Pulmonary exam normal       Cardiovascular hypertension, Pt. on medications Regular Normal    Neuro/Psych TIA (no sequelae)   GI/Hepatic negative GI ROS, Neg liver ROS,   Endo/Other  Negative Endocrine ROS  Renal/GU negative Renal ROS     Musculoskeletal   Abdominal   Peds  Hematology   Anesthesia Other Findings   Reproductive/Obstetrics                           Anesthesia Physical Anesthesia Plan  ASA: II  Anesthesia Plan: General   Post-op Pain Management:    Induction: Intravenous  Airway Management Planned: Oral ETT  Additional Equipment:   Intra-op Plan:   Post-operative Plan: Extubation in OR  Informed Consent: I have reviewed the patients History and Physical, chart, labs and discussed the procedure including the risks, benefits and alternatives for the proposed anesthesia with the patient or authorized representative who has indicated his/her understanding and acceptance.   Dental advisory given  Plan Discussed with: CRNA and Surgeon  Anesthesia Plan Comments: (Plan routine monitors, GETA)        Anesthesia Quick Evaluation

## 2011-06-25 NOTE — Anesthesia Postprocedure Evaluation (Signed)
  Anesthesia Post-op Note  Patient: Shelly Martin  Procedure(s) Performed:  LUMBAR LAMINECTOMY/DECOMPRESSION MICRODISCECTOMY - RIGHT Lumbar Two-Three hemilaminectomy and microdiskectomy  Patient Location: PACU  Anesthesia Type: General  Level of Consciousness: awake, alert  and oriented  Airway and Oxygen Therapy: Patient Spontanous Breathing  Post-op Pain: mild  Post-op Assessment: Post-op Vital signs reviewed and Patient's Cardiovascular Status Stable  Post-op Vital Signs: stable  Complications: No apparent anesthesia complications

## 2011-06-25 NOTE — H&P (Signed)
Subjective: Patient is a 78 y.o. female who is admitted for treatment of acute right lumbar radiculopathy secondary to a right L3 lumbar disc herniation on the body of L3 on the right side. Patient is admitted now for a right L3 hemilaminectomy and microdiscectomy.  Patient has been under treatment by me for multilevel multifactorial lumbar stenosis secondary to multilevel lumbar spondylosis and degenerative disc disease. She is responded well to epidural steroid injections.  However in mid December she developed a new acute pain on the right side of her low back braiding down to the right buttock and the lateral right hip and thigh as well as into the anterior distal right thigh and across the anterior aspect of the right knee. Because of the acute symptomatology patient was recently with an updated MRI that again showed multilevel multifactorial lumbar stenosis secondary to spondylosis and degenerative disc disease, left there was a notable new finding of a disc herniation behind the body of L3, that probably migrated rostrally from the L3-4 disc level, and is compressing the exiting right L3 nerve root.  We again tried A. epidural steroid injection but she has no relief with that and patient feels that the pain is unbearable and wants to proceed with surgical intervention and she is admitted now for a right L3 hemilaminectomy and microdiscectomy.    Past Medical History  Diagnosis Date  . Stroke     Mini, no residual  . Hypertension   . Restless legs syndrome     on Requip  . Ulcer causing bleeding and hole in wall of stomach or small intestine   . Gastric ulcer     years ago  . Arthritis     Back, knees  . Constipation   . Urinary bladder calculus   . Urinary incontinence   . Bowel incontinence     Past Surgical History  Procedure Date  . Colon resection 50 years ago  . Tonsillectomy   . Uterine fibroid surgery     Prescriptions prior to admission  Medication Sig Dispense Refill   . acetaminophen (TYLENOL) 325 MG tablet Take 650 mg by mouth every 6 (six) hours as needed. For pain      . amLODipine (NORVASC) 5 MG tablet Take 5 mg by mouth daily.      Marland Kitchen aspirin 325 MG EC tablet Take 325 mg by mouth daily.      . polyethylene glycol (MIRALAX / GLYCOLAX) packet Take 17 g by mouth daily.      Marland Kitchen rOPINIRole (REQUIP) 2 MG tablet Take 2 mg by mouth at bedtime.       No Known Allergies  History  Substance Use Topics  . Smoking status: Never Smoker   . Smokeless tobacco: Not on file  . Alcohol Use: Yes     rarely    Family History  Problem Relation Age of Onset  . Anesthesia problems Neg Hx      Review of Systems A comprehensive review of systems was negative except for: Those difficulties described above.  Objective: Vital signs in last 24 hours: Temp:  [98.1 F (36.7 C)] 98.1 F (36.7 C) (01/30 1346) Pulse Rate:  [75] 75  (01/30 1346) Resp:  [18] 18  (01/30 1346) BP: (133)/(81) 133/81 mmHg (01/30 1346) SpO2:  [95 %] 95 % (01/30 1346)  EXAM: Patient is a well-developed well-nourished white female in no acute distress. Lungs are clear to auscultation , the patient has symmetrical respiratory excursion. Heart has a regular rate  and rhythm normal S1 and S2 no murmur.   Abdomen is soft nontender nondistended bowel sounds are present. Extremity examination shows no clubbing cyanosis or edema.  Motor examination shows 5 over 5 strength in the lower extremities including the iliopsoas quadriceps dorsiflexor extensor hallicus  longus and plantar flexor bilaterally. Sensation is intact to pinprick in the distal lower extremities. Reflexes are symmetrical bilaterally. No pathologic reflexes are present. Patient has a normal gait and stance.   Data Review:CBC    Component Value Date/Time   WBC 12.0* 06/20/2011 1015   RBC 4.41 06/20/2011 1015   HGB 13.6 06/20/2011 1015   HCT 40.7 06/20/2011 1015   PLT 323 06/20/2011 1015   MCV 92.3 06/20/2011 1015   MCH 30.8 06/20/2011 1015     MCHC 33.4 06/20/2011 1015   RDW 12.9 06/20/2011 1015   LYMPHSABS 2.1 11/20/2010 0815   MONOABS 0.5 11/20/2010 0815   EOSABS 0.2 11/20/2010 0815   BASOSABS 0.1 11/20/2010 0815                          BMET    Component Value Date/Time   NA 141 06/20/2011 1015   K 4.0 06/20/2011 1015   CL 101 06/20/2011 1015   CO2 30 06/20/2011 1015   GLUCOSE 80 06/20/2011 1015   BUN 28* 06/20/2011 1015   CREATININE 0.75 06/20/2011 1015   CALCIUM 9.1 06/20/2011 1015   GFRNONAA 72* 06/20/2011 1015   GFRAA 83* 06/20/2011 1015     Assessment/Plan: Acute right lumbar radiculopathy secondary to a right L3 lumbar disc herniation. Patient is admitted now for a right L3 hemilaminectomy and microdiscectomy. I've discussed with the patient the nature of his condition, the nature the surgical procedure, the typical length of surgery, hospital stay, and overall recuperation. We discussed limitations postoperatively. I discussed risks of surgery including risks of infection, bleeding, possibly need for transfusion, the risk of nerve root dysfunction with pain, weakness, numbness, or paresthesias, or risk of dural tear and CSF leakage and possible need for further surgery, the risk of recurrent disc herniation and the possible need for further surgery, and the risk of anesthetic complications including myocardial infarction, stroke, pneumonia, and death. She does understand that with her age that she is at greater risk than the typical patient accepts those risks. Understanding all this the patient does wish to proceed with surgery and is admitted for such.    Hewitt Shorts, MD 06/25/2011 3:00 PM

## 2011-06-26 MED ORDER — HYDROCODONE-ACETAMINOPHEN 5-325 MG PO TABS: 0.5000 | ORAL_TABLET | ORAL | Status: AC | PRN

## 2011-06-26 MED ORDER — KETOROLAC TROMETHAMINE 30 MG/ML IJ SOLN
15.0000 mg | Freq: Four times a day (QID) | INTRAMUSCULAR | Status: DC
  Administered 2011-06-26 (×2): 15 mg via INTRAVENOUS
  Filled 2011-06-26 (×5): qty 1

## 2011-06-26 MED ORDER — PNEUMOCOCCAL VAC POLYVALENT 25 MCG/0.5ML IJ INJ: 0.5000 mL | INJECTION | INTRAMUSCULAR | Status: DC

## 2011-06-26 NOTE — Progress Notes (Signed)
Filed Vitals:   06/25/11 2315 06/25/11 2356 06/26/11 0200 06/26/11 0400  BP: 118/42 159/61 130/51 136/49  Pulse: 56 53 52 54  Temp:  98.1 F (36.7 C) 98 F (36.7 C) 97.6 F (36.4 C)  TempSrc:  Oral Oral Oral  Resp: 11 20 18 18   SpO2: 96% 97% 97% 100%    Patient resting comfortably in bed. Has ambulated in the halls. Dressing clean and dry. Patient has voided. Little by mouth intake so far.   Plan: Have encouraged further ambulation this morning, we'll monitor by mouth intake, if patient progresses well through the day we'll discharge later today.  Hewitt Shorts, MD 06/26/2011, 7:53 AM

## 2011-06-26 NOTE — Discharge Summary (Signed)
Physician Discharge Summary  Patient ID: Samanthajo Payano MRN: [id number] DOB/AGE: 10-28-1918 78 y.o.  Admit date: 06/25/2011 Discharge date: 06/26/2011  Admission Diagnoses: Right L3-4 lumbar disc herniation, lumbar degenerative disc disease, lumbar spondylosis, lumbar stenosis, lumbar radiculopathy  Discharge Diagnoses:  Right L3-4 lumbar disc herniation, lumbar degenerative disc disease, lumbar spondylosis, lumbar stenosis, lumbar radiculopathy  Discharged Condition: good  Hospital Course: Patient was admitted underwent a right L3 hemilaminectomy and microdiscectomy. She is done well following surgery. She is up and ambulating actively in the halls. The wound is healing well. She is eating and voiding well. She's been given instructions regarding wound care and activities following discharge. She is to followup with me in the office in 3 weeks.  Discharge Exam: Blood pressure 151/72, pulse 72, temperature 97.3 F (36.3 C), temperature source Oral, resp. rate 20, SpO2 93.00%.  Disposition: Home   Medication List  As of 06/26/2011 10:46 AM   TAKE these medications         acetaminophen 325 MG tablet   Commonly known as: TYLENOL   Take 650 mg by mouth every 6 (six) hours as needed. For pain      amLODipine 5 MG tablet   Commonly known as: NORVASC   Take 5 mg by mouth daily.      aspirin 325 MG EC tablet   Take 325 mg by mouth daily.      HYDROcodone-acetaminophen 5-325 MG per tablet   Commonly known as: NORCO   Take 0.5-1 tablets by mouth every 4 (four) hours as needed.      polyethylene glycol packet   Commonly known as: MIRALAX / GLYCOLAX   Take 17 g by mouth daily.      rOPINIRole 2 MG tablet   Commonly known as: REQUIP   Take 2 mg by mouth at bedtime.             Signed: Hewitt Shorts, MD 06/26/2011, 10:46 AM

## 2011-06-27 ENCOUNTER — Encounter (HOSPITAL_COMMUNITY): Payer: Self-pay | Admitting: Neurosurgery

## 2011-08-01 ENCOUNTER — Encounter: Payer: Self-pay | Admitting: Gastroenterology

## 2011-08-15 ENCOUNTER — Ambulatory Visit (INDEPENDENT_AMBULATORY_CARE_PROVIDER_SITE_OTHER): Payer: Medicare Other | Admitting: Family Medicine

## 2011-08-15 VITALS — BP 153/64 | HR 85 | Temp 97.7°F | Resp 16 | Ht 60.0 in | Wt 135.4 lb

## 2011-08-15 DIAGNOSIS — T148XXA Other injury of unspecified body region, initial encounter: Secondary | ICD-10-CM

## 2011-08-15 DIAGNOSIS — IMO0002 Reserved for concepts with insufficient information to code with codable children: Secondary | ICD-10-CM

## 2011-08-15 NOTE — Progress Notes (Signed)
  Patient Name: Shelly Martin Date of Birth: [birth year] Medical Record Number: [id number] Gender: female Date of Encounter: 08/15/2011  History of Present Illness:  Shelly Martin is a 78 y.o. very pleasant female patient who presents with the following:  She had accidentally picked her left shin with her right foot a few days go- the original wound was healing ok, but then the band- aid that she had used to cover the wound seemed to be stuck and when she pulled it off the skin tore.  This occurred yesterday.  The areas where the skin was pulled off are still oozing a little blood.  Her tetanus shot was recent.  She is also currently being treated for back problems by Dr. Jule Ser   There is no problem list on file for this patient.  Past Medical History  Diagnosis Date  . Stroke     Mini, no residual  . Hypertension   . Restless legs syndrome     on Requip  . Ulcer causing bleeding and hole in wall of stomach or small intestine   . Gastric ulcer     years ago  . Arthritis     Back, knees  . Constipation   . Urinary bladder calculus   . Urinary incontinence   . Bowel incontinence    Past Surgical History  Procedure Date  . Colon resection 50 years ago  . Tonsillectomy   . Uterine fibroid surgery   . Lumbar laminectomy/decompression microdiscectomy 06/25/2011    Procedure: LUMBAR LAMINECTOMY/DECOMPRESSION MICRODISCECTOMY;  Surgeon: Hewitt Shorts, MD;  Location: [facility];  Service: Neurosurgery;  Laterality: Right;  RIGHT Lumbar Two-Three hemilaminectomy and microdiskectomy   History  Substance Use Topics  . Smoking status: Never Smoker   . Smokeless tobacco: Not on file  . Alcohol Use: Yes     rarely   Family History  Problem Relation Age of Onset  . Anesthesia problems Neg Hx    No Known Allergies  Medication list has been reviewed and updated.  Review of Systems: As per HPI- otherwise negative. No sign of infection that she has noted  Physical  Examination: Filed Vitals:   08/15/11 1243  BP: 153/64  Pulse: 85  Temp: 97.7 F (36.5 C)  TempSrc: Oral  Resp: 16  Height: 5' (1.524 m)  Weight: 135 lb 6.4 oz (61.417 kg)    Body mass index is 26.44 kg/(m^2).   GEN: WDWN, NAD, Non-toxic, Alert & Oriented x 3 HEENT: Atraumatic, Normocephalic.  Ears and Nose: No external deformity. EXTR: No clubbing/cyanosis/edema NEURO: Normal gait.  PSYCH: Normally interactive. Conversant. Not depressed or anxious appearing.  Calm demeanor.  There are 2 skin tears on her left anterior shin, to either side of a healing older skin tear.    The skin is totally avulsed and the areas are oozing just a little bit.  No swelling, redness or sign of infection  Cleaned wound with water.  Dressed with xeroform gauze, a non- stick pad, tube gauze and coban wrap  Assessment and Plan: Skin tear due to bandage use.  Should heal- explained dressing procedure to patient and her daughter.  Continue to keep dressed for the next several days- once an occluding scab is formed she can cover the wound lightly as needed.  Let us know if any sign of infection or other complications

## 2011-08-19 ENCOUNTER — Ambulatory Visit (INDEPENDENT_AMBULATORY_CARE_PROVIDER_SITE_OTHER): Payer: Medicare Other | Admitting: Family Medicine

## 2011-08-19 VITALS — BP 104/64 | HR 93 | Temp 98.3°F | Resp 16 | Ht 59.5 in | Wt 136.4 lb

## 2011-08-19 DIAGNOSIS — L0390 Cellulitis, unspecified: Secondary | ICD-10-CM

## 2011-08-19 DIAGNOSIS — L0291 Cutaneous abscess, unspecified: Secondary | ICD-10-CM

## 2011-08-19 MED ORDER — CEPHALEXIN 500 MG PO CAPS: 500.0000 mg | ORAL_CAPSULE | Freq: Three times a day (TID) | ORAL | Status: AC

## 2011-08-19 NOTE — Progress Notes (Signed)
  Patient Name: Shelly Martin Date of Birth: 11-26-1918 Medical Record Number: [id number] Gender: female Date of Encounter: 08/19/2011  History of Present Illness:  Shelly Martin is a 78 y.o. very pleasant female patient who presents with the following:  See last ov- she has a skin tear on her left shin.  Was doing well, but today she pulled off the vaseline gauze covering the wound and a part started to bleed again.  Otherwise she is well,  Although she does note that there is a little redness around the original wound and she is concerned about infection.   There is no problem list on file for this patient.  Past Medical History  Diagnosis Date  . Stroke     Mini, no residual  . Hypertension   . Restless legs syndrome     on Requip  . Ulcer causing bleeding and hole in wall of stomach or small intestine   . Gastric ulcer     years ago  . Arthritis     Back, knees  . Constipation   . Urinary bladder calculus   . Urinary incontinence   . Bowel incontinence    Past Surgical History  Procedure Date  . Colon resection 50 years ago  . Tonsillectomy   . Uterine fibroid surgery   . Lumbar laminectomy/decompression microdiscectomy 06/25/2011    Procedure: LUMBAR LAMINECTOMY/DECOMPRESSION MICRODISCECTOMY;  Surgeon: Hewitt Shorts, MD;  Location: [facility];  Service: Neurosurgery;  Laterality: Right;  RIGHT Lumbar Two-Three hemilaminectomy and microdiskectomy   History  Substance Use Topics  . Smoking status: Never Smoker   . Smokeless tobacco: Not on file  . Alcohol Use: Yes     rarely   Family History  Problem Relation Age of Onset  . Anesthesia problems Neg Hx    No Known Allergies  Medication list has been reviewed and updated.  Review of Systems: As per HPI- otherwise negative.  Physical Examination: Filed Vitals:   08/19/11 1027  BP: 104/64  Pulse: 93  Temp: 98.3 F (36.8 C)  TempSrc: Oral  Resp: 16  Height: 4' 11.5" (1.511 m)  Weight: 136  lb 6.4 oz (61.871 kg)    Body mass index is 27.09 kg/(m^2).   GEN: WDWN, NAD, Non-toxic, Alert & Oriented x 3 HEENT: Atraumatic, Normocephalic.  Ears and Nose: No external deformity. EXTR: No clubbing/cyanosis/edema NEURO: Normal gait.  PSYCH: Normally interactive. Conversant. Not depressed or anxious appearing.  Calm demeanor.  Left shin: there is a central, original skin tear which is getting better- there is a little redness around the wound.  Flanked by newer skin tears from bandage used to cover original wound- there is a small area that is oozing blood again where the granulated tissue was disturbed when she pulled off the bandage.  It seems that vaseline gauze is not "non- stick" enough to keep wound from being disturbed  Used dry- sol to control bleeding.  covered with a generous amount of antibiotic ointment as we do not have sterile vaseline- then used a non- stick pad, tube gauze and telfa  Assessment and Plan: Wound care visit- I still think the area is overall doing well.  Change to plain vaseline at next visit to avoid irritation.  Let me know if not doing well- otherwise plan recheck on Friday 1. Cellulitis  cephALEXin (KEFLEX) 500 MG capsule   Start keflex for possible early cellullitis

## 2011-08-20 ENCOUNTER — Encounter: Payer: Self-pay | Admitting: Gastroenterology

## 2011-08-20 ENCOUNTER — Other Ambulatory Visit: Payer: Self-pay | Admitting: Neurosurgery

## 2011-08-20 ENCOUNTER — Ambulatory Visit (INDEPENDENT_AMBULATORY_CARE_PROVIDER_SITE_OTHER): Payer: Medicare Other | Admitting: Gastroenterology

## 2011-08-20 DIAGNOSIS — M48061 Spinal stenosis, lumbar region without neurogenic claudication: Secondary | ICD-10-CM

## 2011-08-20 DIAGNOSIS — K5900 Constipation, unspecified: Secondary | ICD-10-CM | POA: Insufficient documentation

## 2011-08-20 DIAGNOSIS — R32 Unspecified urinary incontinence: Secondary | ICD-10-CM | POA: Insufficient documentation

## 2011-08-20 MED ORDER — SOLIFENACIN SUCCINATE 5 MG PO TABS: 5.0000 mg | ORAL_TABLET | Freq: Every day | ORAL | Status: DC

## 2011-08-20 NOTE — Assessment & Plan Note (Signed)
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 Patient has never had a colonoscopy.  Recommendations #1 colonoscopy. If negative I will work on a bowel regimen with her

## 2011-08-20 NOTE — Assessment & Plan Note (Signed)
Plan trial of VESIcare and urology consult

## 2011-08-20 NOTE — Progress Notes (Signed)
History of Present Illness:  Shelly Martin is a pleasant 78 year old white female referred at the request of Dr. Renato Gails for evaluation of constipation. This has been long-standing problem despite taking MiraLax. She may have a bowel movement every 2 or 3 days but it feels incomplete. She occasionally has incontinence of stool. She claims stools are intermittently brown and solid and black and slimy at other times. She has a history of a gastric ulcer many years ago. 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 There is no history of frank melena or hematochezia. She complains of rectal soreness from straining.    Past Medical History  Diagnosis Date  . Stroke     Mini, no residual  . Hypertension   . Restless legs syndrome     on Requip  . Ulcer causing bleeding and hole in wall of stomach or small intestine   . Gastric ulcer     years ago  . Arthritis     Back, knees  . Constipation   . Urinary bladder calculus   . Urinary incontinence   . Bowel incontinence    Past Surgical History  Procedure Date  . Colon resection 50 years ago  . Tonsillectomy   . Uterine fibroid surgery   . Lumbar laminectomy/decompression microdiscectomy 06/25/2011    Procedure: LUMBAR LAMINECTOMY/DECOMPRESSION MICRODISCECTOMY;  Surgeon: Hewitt Shorts, MD;  Location: [facility];  Service: Neurosurgery;  Laterality: Right;  RIGHT Lumbar Two-Three hemilaminectomy and microdiskectomy   family history includes Pancreatic cancer in her brother.  There is no history of Anesthesia problems. Current Outpatient Prescriptions  Medication Sig Dispense Refill  . acetaminophen (TYLENOL) 325 MG tablet Take 650 mg by mouth every 6 (six) hours as needed. For pain      . amLODipine (NORVASC) 5 MG tablet Take 5 mg by mouth daily.      Marland Kitchen aspirin 325 MG EC tablet Take 325 mg by mouth daily.      . cephALEXin (KEFLEX) 500 MG capsule Take 1 capsule (500 mg total) by  mouth 3 (three) times daily.  30 capsule  0  . HYDROcodone-acetaminophen (NORCO) 10-325 MG per tablet Take 1 tablet by mouth every 6 (six) hours as needed.      . polyethylene glycol (MIRALAX / GLYCOLAX) packet Take 17 g by mouth daily.      . pregabalin (LYRICA) 50 MG capsule Take 50 mg by mouth 1 day or 1 dose.      Marland Kitchen rOPINIRole (REQUIP) 2 MG tablet Take 2 mg by mouth at bedtime.       Allergies as of 08/20/2011  . (No Known Allergies)    reports that she has never smoked. She has never used smokeless tobacco. She reports that she does not drink alcohol or use illicit drugs.     Review of Systems:  She has frequent incontinence of urine at night Pertinent positive and negative review of systems were noted in the above HPI section. All other review of systems were otherwise negative.  Vital signs were reviewed in today's medical record Physical Exam: General: Well developed , well nourished, no acute distress Head: Normocephalic and atraumatic Eyes:  sclerae anicteric, EOMI Ears: Normal auditory acuity Mouth: No deformity or lesions Neck: Supple, no masses or thyromegaly Lungs: Clear throughout to auscultation Heart: Regular rate and rhythm; no murmurs, rubs or bruits Abdomen: Soft, non tender and non distended. No masses, hepatosplenomegaly or  hernias noted. Normal Bowel sounds Rectal: External hemorrhoids are present. There are no rectal masses. Stool is Hemoccult negative Musculoskeletal: Symmetrical with no gross deformities  Skin: No lesions on visible extremities Pulses:  Normal pulses noted Extremities: No clubbing, cyanosis, edema or deformities noted Neurological: Alert oriented x 4, grossly nonfocal Cervical Nodes:  No significant cervical adenopathy Inguinal Nodes: No significant inguinal adenopathy Psychological:  Alert and cooperative. Normal mood and affect

## 2011-08-20 NOTE — Patient Instructions (Signed)
Colonoscopy A colonoscopy is an exam to evaluate your entire colon. In this exam, your colon is cleansed. A long fiberoptic tube is inserted through your rectum and into your colon. The fiberoptic scope (endoscope) is a long bundle of enclosed and very flexible fibers. These fibers transmit light to the area examined and send images from that area to your caregiver. Discomfort is usually minimal. You may be given a drug to help you sleep (sedative) during or prior to the procedure. This exam helps to detect lumps (tumors), polyps, inflammation, and areas of bleeding. Your caregiver may also take a small piece of tissue (biopsy) that will be examined under a microscope. LET YOUR CAREGIVER KNOW ABOUT:   Allergies to food or medicine.   Medicines taken, including vitamins, herbs, eyedrops, over-the-counter medicines, and creams.   Use of steroids (by mouth or creams).   Previous problems with anesthetics or numbing medicines.   History of bleeding problems or blood clots.   Previous surgery.   Other health problems, including diabetes and kidney problems.   Possibility of pregnancy, if this applies.  BEFORE THE PROCEDURE   A clear liquid diet may be required for 2 days before the exam.   Ask your caregiver about changing or stopping your regular medications.   Liquid injections (enemas) or laxatives may be required.   A large amount of electrolyte solution may be given to you to drink over a short period of time. This solution is used to clean out your colon.   You should be present 60 minutes prior to your procedure or as directed by your caregiver.  AFTER THE PROCEDURE   If you received a sedative or pain relieving medication, you will need to arrange for someone to drive you home.   Occasionally, there is a little blood passed with the first bowel movement. Do not be concerned.  FINDING OUT THE RESULTS OF YOUR TEST Not all test results are available during your visit. If  your test results are not back during the visit, make an appointment with your caregiver to find out the results. Do not assume everything is normal if you have not heard from your caregiver or the medical facility. It is important for you to follow up on all of your test results. HOME CARE INSTRUCTIONS   It is not unusual to pass moderate amounts of gas and experience mild abdominal cramping following the procedure. This is due to air being used to inflate your colon during the exam. Walking or a warm pack on your belly (abdomen) may help.   You may resume all normal meals and activities after sedatives and medicines have worn off.   Only take over-the-counter or prescription medicines for pain, discomfort, or fever as directed by your caregiver. Do not use aspirin or blood thinners if a biopsy was taken. Consult your caregiver for medicine usage if biopsies were taken.  SEEK IMMEDIATE MEDICAL CARE IF:   You have a fever.   You pass large blood clots or fill a toilet with blood following the procedure. This may also occur 10 to 14 days following the procedure. This is more likely if a biopsy was taken.   You develop abdominal pain that keeps getting worse and cannot be relieved with medicine.  Document Released: 05/09/2000 Document Revised: 05/01/2011 Document Reviewed: 12/23/2007 [facility] Patient Information 2012 [place], [place].   Your appointment at [facility] is scheduled on 10/03/2011 at 8:30am with Scott Macdiarmid There phone number is  [phone] in case you need to rescedule

## 2011-08-25 ENCOUNTER — Ambulatory Visit (INDEPENDENT_AMBULATORY_CARE_PROVIDER_SITE_OTHER): Payer: Medicare Other | Admitting: Family Medicine

## 2011-08-25 VITALS — BP 162/69 | HR 73 | Temp 98.5°F | Resp 16 | Ht 60.0 in | Wt 136.0 lb

## 2011-08-25 DIAGNOSIS — L03119 Cellulitis of unspecified part of limb: Secondary | ICD-10-CM

## 2011-08-25 DIAGNOSIS — L02419 Cutaneous abscess of limb, unspecified: Secondary | ICD-10-CM

## 2011-08-25 MED ORDER — CEFTRIAXONE SODIUM 1 G IJ SOLR
1.0000 g | Freq: Once | INTRAMUSCULAR | Status: AC
  Administered 2011-08-25: 1 g via INTRAMUSCULAR

## 2011-08-25 MED ORDER — DOXYCYCLINE HYCLATE 100 MG PO CAPS: 100.0000 mg | ORAL_CAPSULE | Freq: Two times a day (BID) | ORAL | Status: AC

## 2011-08-25 NOTE — Progress Notes (Signed)
  Patient Name: Shelly Martin Date of Birth: 1919/02/08 Medical Record Number: [id number] Gender: female Date of Encounter: 08/25/2011  History of Present Illness:  Shelly Martin is a 78 y.o. very pleasant female patient who presents with the following:  Here to recheck the wound on her left leg- was here last nearly a week ago- did not RTC for a recheck on Friday as we had planned.  Area became more red and painful over the last 48 hours.  She is also having a lot of trouble with her back and will have an MRI to evaluate this later on this week.    She notes no fever or other systemic symptoms, but the wounds are more red and tender  Patient Active Problem List  Diagnoses  . Constipation  . Urinary incontinence   Past Medical History  Diagnosis Date  . Stroke     Mini, no residual  . Hypertension   . Restless legs syndrome     on Requip  . Ulcer causing bleeding and hole in wall of stomach or small intestine   . Gastric ulcer     years ago  . Arthritis     Back, knees  . Constipation   . Urinary bladder calculus   . Urinary incontinence   . Bowel incontinence    Past Surgical History  Procedure Date  . Colon resection 50 years ago  . Tonsillectomy   . Uterine fibroid surgery   . Lumbar laminectomy/decompression microdiscectomy 06/25/2011    Procedure: LUMBAR LAMINECTOMY/DECOMPRESSION MICRODISCECTOMY;  Surgeon: Hewitt Shorts, MD;  Location: [facility];  Service: Neurosurgery;  Laterality: Right;  RIGHT Lumbar Two-Three hemilaminectomy and microdiskectomy   History  Substance Use Topics  . Smoking status: Never Smoker   . Smokeless tobacco: Never Used  . Alcohol Use: No     rarely   Family History  Problem Relation Age of Onset  . Anesthesia problems Neg Hx   . Pancreatic cancer Brother    No Known Allergies  Medication list has been reviewed and updated.  Review of Systems: As per HPI- otherwise negative. Tolerating her Keflex ok, but it seems  she may not be taking as much as she is supposed to.  Only one dose so far today  Physical Examination: Filed Vitals:   08/25/11 1920  BP: 162/69  Pulse: 73  Temp: 98.5 F (36.9 C)  TempSrc: Oral  Resp: 16  Height: 5' (1.524 m)  Weight: 136 lb (61.689 kg)    Body mass index is 26.56 kg/(m^2).   GEN: WDWN, NAD, Non-toxic, Alert & Oriented x 3 HEENT: Atraumatic, Normocephalic.  Ears and Nose: No external deformity. EXTR: No clubbing/cyanosis/edema NEURO: Normal gait.  PSYCH: Normally interactive. Conversant. Not depressed or anxious appearing.  Calm demeanor.  Left anterior distal shin with healing skin tear wounds.  The skin tears are now surrounded with an area of redness and early cellulitis.  There is no circumferential redness or swelling or sign of compartment syndrome.  No purulence or other wound discharge.  Outlined redness with skin marker  Assessment and Plan: 1. Cellulitis, leg  cefTRIAXone (ROCEPHIN) injection 1 g, doxycycline (VIBRAMYCIN) 100 MG capsule   Rocephin one gram now for cellulitis, and added doxycycline to her regimen.  Dressed wound again.  Will recheck tomorrow.  Discussed warning signs of compartment syndrome and told to go to ED if these occur.  Patient and her daughter understood the plan and feel comfortable.

## 2011-08-26 ENCOUNTER — Ambulatory Visit (INDEPENDENT_AMBULATORY_CARE_PROVIDER_SITE_OTHER): Payer: Medicare Other | Admitting: Family Medicine

## 2011-08-26 VITALS — BP 148/78 | HR 80 | Temp 98.8°F | Resp 18 | Ht 59.5 in | Wt 137.0 lb

## 2011-08-26 DIAGNOSIS — S91009A Unspecified open wound, unspecified ankle, initial encounter: Secondary | ICD-10-CM

## 2011-08-26 DIAGNOSIS — Z5189 Encounter for other specified aftercare: Secondary | ICD-10-CM

## 2011-08-26 NOTE — Progress Notes (Signed)
  Patient Name: Shelly Martin Date of Birth: 1918-06-19 Medical Record Number: [id number] Gender: female Date of Encounter: 08/26/2011  History of Present Illness:  Shelly Martin is a 78 y.o. very pleasant female patient who presents with the following:  Here today to recheck left lower anterior shin wound- see OV yesterday.  Feels about the same- not any worse.  We added doxy100 BID and gave a gram of rocephin last night  Patient Active Problem List  Diagnoses  . Constipation  . Urinary incontinence   Past Medical History  Diagnosis Date  . Stroke     Mini, no residual  . Hypertension   . Restless legs syndrome     on Requip  . Ulcer causing bleeding and hole in wall of stomach or small intestine   . Gastric ulcer     years ago  . Arthritis     Back, knees  . Constipation   . Urinary bladder calculus   . Urinary incontinence   . Bowel incontinence    Past Surgical History  Procedure Date  . Colon resection 50 years ago  . Tonsillectomy   . Uterine fibroid surgery   . Lumbar laminectomy/decompression microdiscectomy 06/25/2011    Procedure: LUMBAR LAMINECTOMY/DECOMPRESSION MICRODISCECTOMY;  Surgeon: Hewitt Shorts, MD;  Location: [facility];  Service: Neurosurgery;  Laterality: Right;  RIGHT Lumbar Two-Three hemilaminectomy and microdiskectomy   History  Substance Use Topics  . Smoking status: Never Smoker   . Smokeless tobacco: Never Used  . Alcohol Use: No     rarely   Family History  Problem Relation Age of Onset  . Anesthesia problems Neg Hx   . Pancreatic cancer Brother    No Known Allergies  Medication list has been reviewed and updated.  Review of Systems: As per HPI- otherwise negative. No fever  Physical Examination: Filed Vitals:   08/26/11 0816  BP: 148/78  Pulse: 80  Temp: 98.8 F (37.1 C)  TempSrc: Oral  Resp: 18  Height: 4' 11.5" (1.511 m)  Weight: 137 lb (62.143 kg)    Body mass index is 27.21 kg/(m^2).   GEN:  WDWN, NAD, Non-toxic, Alert & Oriented x 3 HEENT: Atraumatic, Normocephalic.  Ears and Nose: No external deformity. EXTR: No clubbing/cyanosis/edema NEURO: Normal gait.  PSYCH: Normally interactive. Conversant. Not depressed or anxious appearing.  Calm demeanor.  Left leg wound looks better- the redness has receded from the outline drawn yesterday, and swelling and tenderness are decreased.  Wound appears to be drying out and overall appears to be healing  Assessment and Plan: Improvement of cellulitis of left shin wound.  Continue oral antibiotics and let me know if not continuing to make progress.  Overall I am very pleased by improvement today!

## 2011-08-27 ENCOUNTER — Ambulatory Visit
Admission: RE | Admit: 2011-08-27 | Discharge: 2011-08-27 | Disposition: A | Discharge status: Home / Self Care | Payer: Medicare Other | Source: Ambulatory Visit | Attending: Neurosurgery | Admitting: Neurosurgery

## 2011-08-27 DIAGNOSIS — M48061 Spinal stenosis, lumbar region without neurogenic claudication: Secondary | ICD-10-CM

## 2011-08-27 MED ORDER — GADOBENATE DIMEGLUMINE 529 MG/ML IV SOLN
12.0000 mL | Freq: Once | INTRAVENOUS | Status: AC | PRN
  Administered 2011-08-27: 12 mL via INTRAVENOUS

## 2011-09-02 ENCOUNTER — Ambulatory Visit (INDEPENDENT_AMBULATORY_CARE_PROVIDER_SITE_OTHER): Payer: Medicare Other | Admitting: Family Medicine

## 2011-09-02 VITALS — BP 154/69 | HR 71 | Temp 98.8°F | Resp 18 | Ht 59.5 in | Wt 137.0 lb

## 2011-09-02 DIAGNOSIS — L03116 Cellulitis of left lower limb: Secondary | ICD-10-CM

## 2011-09-02 DIAGNOSIS — L02419 Cutaneous abscess of limb, unspecified: Secondary | ICD-10-CM

## 2011-09-02 MED ORDER — CEFTRIAXONE SODIUM 1 G IJ SOLR: 1.0000 g | Freq: Once | INTRAMUSCULAR | Status: DC

## 2011-09-02 MED ORDER — CEFTRIAXONE SODIUM 1 G IJ SOLR
1.0000 g | INTRAMUSCULAR | Status: DC
  Administered 2011-09-02: 1 g via INTRAMUSCULAR

## 2011-09-02 MED ORDER — SILVER SULFADIAZINE 1 % EX CREA: TOPICAL_CREAM | Freq: Every day | CUTANEOUS | Status: DC

## 2011-09-02 NOTE — Progress Notes (Signed)
  Subjective:    Patient ID: Shelly Martin, female    DOB: 23-Jun-1918, 78 y.o.   MRN: [id number]  HPI History of left shin cellulitis treated with Keflex on 3.26.13, then  Doxycycline stating on 4.1.13 for 10 days.  Given Rocephin 1 gram IM on that date. Worsening left shin redness and swelling over the last day or so.  No fever. No chest pain or SOB.   Review of Systems     Objective:   Physical Exam  Constitutional: She is oriented to person, place, and time.  Cardiovascular:       Left foot and calf appropriately warm to touch. Negative Homan's of the left calf.  Musculoskeletal: She exhibits edema and tenderness.       Left foot and lower calf swelling. No posterior calf tenderness.  Neurological: She is alert and oriented to person, place, and time.  Skin: Skin is warm and dry. Lesion noted.       A broad area of erythema in the mid anterior shin with 2 50 cent coin size superficial shallow wounds with scabbing that was debrided revealing robust granulation tissue.  Scant purulent discharge cultured during debridement.          Assessment & Plan:  Exacerbated left shin cellulitis  Rocephin 1 gm IM  F/U tomorrow for recheck  Referral to wound care clinic  Silvadene cream apply to wounds daily until seen by wound care  Wound culture  Continue Doxy 100 mg bid.  #10 tabs given in addition to what she has left.

## 2011-09-02 NOTE — Progress Notes (Signed)
Addended by: Otho Darner on: 09/02/2011 08:33 PM   Modules accepted: Orders

## 2011-09-02 NOTE — Patient Instructions (Signed)
Continue doxycycline Use topical silvadene cream daily with nonstick dressings until seen by wound care team Return to the clinic tomorrow for reevaluation tomorrow morning

## 2011-09-03 ENCOUNTER — Telehealth: Payer: Self-pay | Admitting: Gastroenterology

## 2011-09-03 ENCOUNTER — Ambulatory Visit (INDEPENDENT_AMBULATORY_CARE_PROVIDER_SITE_OTHER): Payer: Medicare Other | Admitting: Family Medicine

## 2011-09-03 VITALS — BP 130/67 | HR 83 | Temp 98.1°F | Resp 18 | Ht 60.0 in | Wt 135.4 lb

## 2011-09-03 DIAGNOSIS — S91009A Unspecified open wound, unspecified ankle, initial encounter: Secondary | ICD-10-CM

## 2011-09-03 DIAGNOSIS — L03116 Cellulitis of left lower limb: Secondary | ICD-10-CM

## 2011-09-03 MED ORDER — CEFTRIAXONE SODIUM 1 G IJ SOLR: 1.0000 g | INTRAMUSCULAR | Status: DC

## 2011-09-03 MED ORDER — PEG-KCL-NACL-NASULF-NA ASC-C 100 G PO SOLR: 1.0000 | Freq: Once | ORAL | Status: DC

## 2011-09-03 NOTE — Progress Notes (Signed)
Patient Name: Shelly Martin Date of Birth: [birth year] Medical Record Number: [id number] Gender: female Date of Encounter: 09/03/2011  History of Present Illness:  Shelly Martin is a 78 y.o. very pleasant female patient who presents with the following:  Here today to recheck skin tear and celluliits of left anterior shin- this has been a problem for some time.  See last OV with me from a week ago- she was doing better, but then her symptoms became worse again a couple of days ago.  Was in last night and seen by Dr. Althea Charon who tretaed her with Rocephin.     Patient Active Problem List  Diagnoses  . Constipation  . Urinary incontinence   Past Medical History  Diagnosis Date  . Stroke     Mini, no residual  . Hypertension   . Restless legs syndrome     on Requip  . Ulcer causing bleeding and hole in wall of stomach or small intestine   . Gastric ulcer     years ago  . Arthritis     Back, knees  . Constipation   . Urinary bladder calculus   . Urinary incontinence   . Bowel incontinence    Past Surgical History  Procedure Date  . Colon resection 50 years ago  . Tonsillectomy   . Uterine fibroid surgery   . Lumbar laminectomy/decompression microdiscectomy 06/25/2011    Procedure: LUMBAR LAMINECTOMY/DECOMPRESSION MICRODISCECTOMY;  Surgeon: Hewitt Shorts, MD;  Location: [facility];  Service: Neurosurgery;  Laterality: Right;  RIGHT Lumbar Two-Three hemilaminectomy and microdiskectomy   History  Substance Use Topics  . Smoking status: Never Smoker   . Smokeless tobacco: Never Used  . Alcohol Use: No     rarely   Family History  Problem Relation Age of Onset  . Anesthesia problems Neg Hx   . Pancreatic cancer Brother    No Known Allergies  Medication list has been reviewed and updated.  Review of Systems: As per HPI- otherwise negative.Marland Kitchen  Physical Examination: Filed Vitals:   09/03/11 1137  BP: 130/67  Pulse: 83  Temp: 98.1 F (36.7 C)    TempSrc: Oral  Resp: 18  Height: 5' (1.524 m)  Weight: 135 lb 6.4 oz (61.417 kg)    Body mass index is 26.44 kg/(m^2).  GEN: WDWN, NAD, Non-toxic, A & O x 3 HEENT: Atraumatic, Normocephalic. Neck supple. No masses, No LAD. Ears and Nose: No external deformity. CV: RRR, No M/G/R. No JVD. No thrill. No extra heart sounds. PULM: CTA B, no wheezes, crackles, rhonchi. No retractions. No resp. distress. No accessory muscle use. EXTR: No c/c/e NEURO Normal gait.  PSYCH: Normally interactive. Conversant. Not depressed or anxious appearing.  Calm demeanor.  Left anteior shin shows two areas of healing skin tear, and a central area where the original skin tear has already healed.  Wounds are granualting and appear healthy.  However, there is swelling and tenderness around the wounds.  There is some minimal redness but not outside the outlined area.    Assessment and Plan: 1. Cellulitis of leg, left  cefTRIAXone (ROCEPHIN) injection 1 g   Repeated rocephin injection today as she does seem to respond well to this.  Iveth is hesitant to go to wound care as she has a hard time paying for extra appointments.  Despite the swelling and set- back recently, she does overall seem to be healing.  At this time we will defer sending her to wound care per her request- she will  continue her doxycycline and silvadene to her wounds and call me if not continuing to get better- I am glad to see her for a recheck on Friday if needed.

## 2011-09-03 NOTE — Telephone Encounter (Signed)
Medication sent to pharmacy  

## 2011-09-06 LAB — WOUND CULTURE: Gram Stain: NONE SEEN

## 2011-09-08 ENCOUNTER — Telehealth: Payer: Self-pay

## 2011-09-08 NOTE — Telephone Encounter (Signed)
.  [facility] Jacklyn Shell, RN the congregational nurse with [employer] who is working with Mrs. Mamaril called regarding validity of Rx she picked up for Mrs. Bonfiglio.  Jacklyn Shell, RN stated that the Ceftriaxone came as powder and needs to be mixed with saline to be injected, but she is unaware that this was supposed to be given to Mrs. Bozarth and would like clarification of Rx and dosing instructions.  Please call Jacklyn Shell, RN at [phone].

## 2011-09-10 NOTE — Telephone Encounter (Signed)
Called Gilda [facility]OM to [facility]

## 2011-09-10 NOTE — Telephone Encounter (Signed)
Spoke with Dr Patsy Lager and she advised not to take the med and it was a mistake. I left detailed message for Rod Holler explaining it was a mistake and to cb if she has any concerns

## 2011-09-12 ENCOUNTER — Encounter (HOSPITAL_BASED_OUTPATIENT_CLINIC_OR_DEPARTMENT_OTHER): Payer: Medicare Other

## 2011-09-12 ENCOUNTER — Encounter: Payer: Self-pay | Admitting: Gastroenterology

## 2011-09-12 ENCOUNTER — Ambulatory Visit (AMBULATORY_SURGERY_CENTER): Payer: Medicare Other | Admitting: Gastroenterology

## 2011-09-12 VITALS — BP 126/63 | HR 82 | Temp 97.6°F | Resp 14 | Ht <= 58 in | Wt 136.0 lb

## 2011-09-12 DIAGNOSIS — Z1211 Encounter for screening for malignant neoplasm of colon: Secondary | ICD-10-CM

## 2011-09-12 DIAGNOSIS — D126 Benign neoplasm of colon, unspecified: Secondary | ICD-10-CM

## 2011-09-12 DIAGNOSIS — K5900 Constipation, unspecified: Secondary | ICD-10-CM

## 2011-09-12 MED ORDER — LACTULOSE 10 G PO PACK: PACK | ORAL | Status: DC

## 2011-09-12 MED ORDER — SODIUM CHLORIDE 0.9 % IV SOLN: 500.0000 mL | INTRAVENOUS | Status: DC

## 2011-09-12 NOTE — Patient Instructions (Signed)
Discharge instructions given with verbal understanding. Handout on polyps given. Resume previous medications. YOU HAD AN ENDOSCOPIC PROCEDURE TODAY AT THE [facility]: Refer to the procedure report that was given to you for any specific questions about what was found during the examination.  If the procedure report does not answer your questions, please call your gastroenterologist to clarify.  If you requested that your care partner not be given the details of your procedure findings, then the procedure report has been included in a sealed envelope for you to review at your convenience later.  YOU SHOULD EXPECT: Some feelings of bloating in the abdomen. Passage of more gas than usual.  Walking can help get rid of the air that was put into your GI tract during the procedure and reduce the bloating. If you had a lower endoscopy (such as a colonoscopy or flexible sigmoidoscopy) you may notice spotting of blood in your stool or on the toilet paper. If you underwent a bowel prep for your procedure, then you may not have a normal bowel movement for a few days.  DIET: Your first meal following the procedure should be a light meal and then it is ok to progress to your normal diet.  A half-sandwich or bowl of soup is an example of a good first meal.  Heavy or fried foods are harder to digest and may make you feel nauseous or bloated.  Likewise meals heavy in dairy and vegetables can cause extra gas to form and this can also increase the bloating.  Drink plenty of fluids but you should avoid alcoholic beverages for 24 hours.  ACTIVITY: Your care partner should take you home directly after the procedure.  You should plan to take it easy, moving slowly for the rest of the day.  You can resume normal activity the day after the procedure however you should NOT DRIVE or use heavy machinery for 24 hours (because of the sedation medicines used during the test).    SYMPTOMS TO REPORT IMMEDIATELY: A  gastroenterologist can be reached at any hour.  During normal business hours, 8:30 AM to 5:00 PM Monday through Friday, call [phone].  After hours and on weekends, please call the GI answering service at [phone] who will take a message and have the physician on call contact you.   Following lower endoscopy (colonoscopy or flexible sigmoidoscopy):  Excessive amounts of blood in the stool  Significant tenderness or worsening of abdominal pains  Swelling of the abdomen that is new, acute  Fever of 100F or higher  FOLLOW UP: If any biopsies were taken you will be contacted by phone or by letter within the next 1-3 weeks.  Call your gastroenterologist if you have not heard about the biopsies in 3 weeks.  Our staff will call the home number listed on your records the next business day following your procedure to check on you and address any questions or concerns that you may have at that time regarding the information given to you following your procedure. This is a courtesy call and so if there is no answer at the home number and we have not heard from you through the emergency physician on call, we will assume that you have returned to your regular daily activities without incident.  SIGNATURES/CONFIDENTIALITY: You and/or your care partner have signed paperwork which will be entered into your electronic medical record.  These signatures attest to the fact that that the information above on your After Visit Summary has   been reviewed and is understood.  Full responsibility of the confidentiality of this discharge information lies with you and/or your care-partner. 

## 2011-09-12 NOTE — Op Note (Signed)
 [facility] [address] [place], [place]  [address]  COLONOSCOPY PROCEDURE REPORT  PATIENT:  Shelly, Martin  MR#:  [id number] BIRTHDATE:  [birth year], 89 yrs. old  GENDER:  female ENDOSCOPIST:  Barbette Hair. Arlyce Dice, MD REF. BY:  Bufford Spikes, M.D. PROCEDURE DATE:  09/12/2011 PROCEDURE:  Colonoscopy with snare polypectomy ASA CLASS:  Class II INDICATIONS:  constipation, Routine Risk Screening MEDICATIONS:   MAC sedation, administered by CRNA propofol 100mg dIV  DESCRIPTION OF PROCEDURE:   After the risks benefits and alternatives of the procedure were thoroughly explained, informed consent was obtained.  Digital rectal exam was performed and revealed no abnormalities.   The LB CF-[id number] endoscope was introduced through the anus and advanced to the cecum, which was identified by both the appendix and ileocecal valve, without limitations.  The quality of the prep was excellent, using MoviPrep.  The instrument was then slowly withdrawn as the colon was fully examined. <<PROCEDUREIMAGES>>  FINDINGS:  A sessile polyp was found in the cecum. It was 3 mm in size. Flat polyp Polyp was snared without cautery. Retrieval was successful (see image3). snare polyp  This was otherwise a normal examination of the colon (see image2 and image5).   Retroflexed views in the rectum revealed no abnormalities.    The time to cecum =  1) 5.50  minutes. The scope was then withdrawn in  1) 7.0 minutes from the cecum and the procedure completed. COMPLICATIONS:  None ENDOSCOPIC IMPRESSION: 1) 3 mm sessile polyp in the cecum 2) Otherwise normal examination RECOMMENDATIONS:Begin lactulose 15-30cc Bid OV 3 weeks  REPEAT EXAM:  No  ______________________________ Barbette Hair. Arlyce Dice, MD  CC:  n. eSIGNED:   Barbette Hair. Navah Grondin at 09/12/2011 10:32 AM  Blair Dolphin, [phone]

## 2011-09-12 NOTE — Progress Notes (Signed)
Patient did not experience any of the following events: a burn prior to discharge; a fall within the facility; wrong site/side/patient/procedure/implant event; or a hospital transfer or hospital admission upon discharge from the facility. (G8907) Patient did not have preoperative order for IV antibiotic SSI prophylaxis. (G8918)  

## 2011-09-14 ENCOUNTER — Ambulatory Visit (INDEPENDENT_AMBULATORY_CARE_PROVIDER_SITE_OTHER): Payer: Medicare Other | Admitting: Family Medicine

## 2011-09-14 VITALS — BP 104/65 | HR 97 | Temp 98.0°F | Resp 16 | Ht 60.0 in | Wt 133.0 lb

## 2011-09-14 DIAGNOSIS — S81809A Unspecified open wound, unspecified lower leg, initial encounter: Secondary | ICD-10-CM

## 2011-09-14 DIAGNOSIS — S81009A Unspecified open wound, unspecified knee, initial encounter: Secondary | ICD-10-CM

## 2011-09-14 DIAGNOSIS — G2581 Restless legs syndrome: Secondary | ICD-10-CM

## 2011-09-14 DIAGNOSIS — Z8673 Personal history of transient ischemic attack (TIA), and cerebral infarction without residual deficits: Secondary | ICD-10-CM

## 2011-09-14 DIAGNOSIS — I10 Essential (primary) hypertension: Secondary | ICD-10-CM

## 2011-09-14 DIAGNOSIS — R110 Nausea: Secondary | ICD-10-CM

## 2011-09-14 DIAGNOSIS — S81802A Unspecified open wound, left lower leg, initial encounter: Secondary | ICD-10-CM

## 2011-09-14 MED ORDER — ONDANSETRON 8 MG PO TBDP: 8.0000 mg | ORAL_TABLET | Freq: Three times a day (TID) | ORAL | Status: AC | PRN

## 2011-09-14 NOTE — Progress Notes (Signed)
This is a 78 year old woman with a slowly healing tape burn on her left lower extremity. He comes in today for recheck of the wound, a spike in blood pressure last night, and some visual disturbance right eye. In the past she's had a CVA which gave her some of the same symptoms Re: right eye. She's had no headache, focal weakness, double vision, trouble swallowing.  She's been noticing gradual swelling swelling of the left leg over the last day, although she cannot take diuretics because it makes her nauseated. She's been elevating the leg and continuing the Silvadene dressings.  Patient had colonoscopy last Friday and has been nauseated since. No vomiting or abdominal pain, and no bleeding per rectum. She had a polyp removed.  Objective: No acute distress, seen with daughter  Eyes: Bilateral cataracts worse on the right, fundi normal  Oropharynx:: Clear  Neck: Left-sided bruit, no adenopathy or stiffness  TMs: Normal  Chest: Clear  Heart: 2/6 murmur of aortic stenosis, regular, no gallop  Extremities: 2 areas of tape burn are showing pink healing on the margins, measure approximately 2 cm in diameter each, and there is some serous drainage from the medial burn. She has 1+ edema underneath the burns. Pulses are normal. Her graft assessment  Assessment: Gradual healing of the wound, blood pressure spike associated with some arterial spasm and possible early TIA last night which has resolved, uncontrolled blood pressure today. I think patient is doing anything she can do to help with wound healing and prevent stroke recurrence. She will be having a steroid injection later this week with Dr. Ezzard Standing for her horrible back pain.  Plan: Continue wound dressing changes, Zofran for the nausea, carotid Dopplers, probiotics

## 2011-09-15 ENCOUNTER — Telehealth: Payer: Self-pay | Admitting: Gastroenterology

## 2011-09-15 ENCOUNTER — Telehealth: Payer: Self-pay | Admitting: *Deleted

## 2011-09-15 NOTE — Telephone Encounter (Signed)
Returned [employer]. Stated that Dr Arlyce Dice sent her in a medication for once daily with no name on the electronic script. I told them by our records the only medication we have sent in once daily is Vesicare 5mg  once daily/ she will process that medication

## 2011-09-15 NOTE — Telephone Encounter (Signed)
  Follow up Call-  Call back number 09/12/2011  Post procedure Call Back phone  # [phone]  Permission to leave phone message Yes     Patient questions:  Do you have a fever, pain , or abdominal swelling? no Pain Score  0 *  Have you tolerated food without any problems? yes  Have you been able to return to your normal activities? yes  Do you have any questions about your discharge instructions: Diet   no Medications  no Follow up visit  no  Do you have questions or concerns about your Care? no  Actions: * If pain score is 4 or above: No action needed, pain <4. Information provided per daughter.

## 2011-09-17 ENCOUNTER — Encounter: Payer: Self-pay | Admitting: Gastroenterology

## 2011-10-08 ENCOUNTER — Ambulatory Visit (INDEPENDENT_AMBULATORY_CARE_PROVIDER_SITE_OTHER): Payer: Medicare Other | Admitting: Gastroenterology

## 2011-10-08 ENCOUNTER — Encounter: Payer: Self-pay | Admitting: Gastroenterology

## 2011-10-08 DIAGNOSIS — Z860100 Personal history of colon polyps, unspecified: Secondary | ICD-10-CM | POA: Insufficient documentation

## 2011-10-08 DIAGNOSIS — K5900 Constipation, unspecified: Secondary | ICD-10-CM

## 2011-10-08 HISTORY — DX: Personal history of colon polyps, unspecified: Z86.0100

## 2011-10-08 HISTORY — DX: Personal history of colonic polyps: Z86.010

## 2011-10-08 MED ORDER — LINACLOTIDE 145 MCG PO CAPS: 1.0000 | ORAL_CAPSULE | Freq: Every day | ORAL | Status: DC

## 2011-10-08 MED ORDER — PSYLLIUM 28 % PO PACK: 1.0000 | PACK | Freq: Two times a day (BID) | ORAL | Status: DC

## 2011-10-08 NOTE — Patient Instructions (Addendum)
We have sent the following medications to your pharmacy for you to pick up at your convenience: Linzess  We have also given you some samples of metamucil to try  Please follow up in one month

## 2011-10-08 NOTE — Assessment & Plan Note (Addendum)
She has chronic idiopathic constipation.  Recommendations #1 fiber supplementation #2 I have given her samples of Linzess to try if fiber supplementation is not successful.

## 2011-10-08 NOTE — Assessment & Plan Note (Signed)
In view of her age she will not undergo routine colonoscopy

## 2011-10-08 NOTE — Progress Notes (Signed)
History of Present Illness:  Shelly Martin has returned following colonoscopy. An adenomatous was polyp was removed. Lactulose was prescribed but she did not fill it because of  cost. She continues to complain of constipation. She occasionally has what sounds like overflow incontinence of stool.    Review of Systems: Urinary incontinence and urgency is still a problem. She is under the care of a urologist and is still taking VESIcare Pertinent positive and negative review of systems were noted in the above HPI section. All other review of systems were otherwise negative.    Current Medications, Allergies, Past Medical History, Past Surgical History, Family History and Social History were reviewed in [employer] electronic medical record  Vital signs were reviewed in today's medical record. Physical Exam: General: Well developed , well nourished, no acute distress

## 2011-10-10 ENCOUNTER — Encounter (HOSPITAL_BASED_OUTPATIENT_CLINIC_OR_DEPARTMENT_OTHER): Payer: Medicare Other

## 2011-10-13 ENCOUNTER — Telehealth: Payer: Self-pay | Admitting: *Deleted

## 2011-10-13 NOTE — Telephone Encounter (Signed)
Ok.  She's trying lactulose

## 2011-10-13 NOTE — Telephone Encounter (Signed)
Received prior authorization request from Patients pharmacy Called patient to inform her, she states she does not want to take Linzess, Informed the pharmacy

## 2011-11-03 ENCOUNTER — Other Ambulatory Visit: Payer: Self-pay | Admitting: Neurosurgery

## 2011-11-03 DIAGNOSIS — M4310 Spondylolisthesis, site unspecified: Secondary | ICD-10-CM

## 2011-11-03 DIAGNOSIS — M25559 Pain in unspecified hip: Secondary | ICD-10-CM

## 2011-11-03 DIAGNOSIS — M48061 Spinal stenosis, lumbar region without neurogenic claudication: Secondary | ICD-10-CM

## 2011-11-05 ENCOUNTER — Inpatient Hospital Stay: Admission: RE | Admit: 2011-11-05 | Payer: Medicare Other | Source: Ambulatory Visit

## 2011-11-09 ENCOUNTER — Ambulatory Visit
Admission: RE | Admit: 2011-11-09 | Discharge: 2011-11-09 | Disposition: A | Discharge status: Home / Self Care | Payer: Medicare Other | Source: Ambulatory Visit | Attending: Neurosurgery | Admitting: Neurosurgery

## 2011-11-09 DIAGNOSIS — M25559 Pain in unspecified hip: Secondary | ICD-10-CM

## 2011-11-09 DIAGNOSIS — M4310 Spondylolisthesis, site unspecified: Secondary | ICD-10-CM

## 2011-11-09 DIAGNOSIS — M48061 Spinal stenosis, lumbar region without neurogenic claudication: Secondary | ICD-10-CM

## 2011-12-20 ENCOUNTER — Emergency Department (HOSPITAL_COMMUNITY): Payer: Medicare Other

## 2011-12-20 ENCOUNTER — Observation Stay (HOSPITAL_COMMUNITY)
Admission: EM | Admit: 2011-12-20 | Discharge: 2011-12-21 | Disposition: A | Discharge status: Home / Self Care | Payer: Medicare Other | Attending: Internal Medicine | Admitting: Internal Medicine

## 2011-12-20 ENCOUNTER — Encounter (HOSPITAL_COMMUNITY): Payer: Self-pay | Admitting: Emergency Medicine

## 2011-12-20 DIAGNOSIS — M47817 Spondylosis without myelopathy or radiculopathy, lumbosacral region: Secondary | ICD-10-CM | POA: Insufficient documentation

## 2011-12-20 DIAGNOSIS — H9190 Unspecified hearing loss, unspecified ear: Secondary | ICD-10-CM | POA: Insufficient documentation

## 2011-12-20 DIAGNOSIS — I10 Essential (primary) hypertension: Secondary | ICD-10-CM

## 2011-12-20 DIAGNOSIS — Z8673 Personal history of transient ischemic attack (TIA), and cerebral infarction without residual deficits: Secondary | ICD-10-CM | POA: Insufficient documentation

## 2011-12-20 DIAGNOSIS — Z860100 Personal history of colon polyps, unspecified: Secondary | ICD-10-CM

## 2011-12-20 DIAGNOSIS — M79609 Pain in unspecified limb: Principal | ICD-10-CM | POA: Insufficient documentation

## 2011-12-20 DIAGNOSIS — G2581 Restless legs syndrome: Secondary | ICD-10-CM

## 2011-12-20 DIAGNOSIS — M545 Low back pain, unspecified: Secondary | ICD-10-CM

## 2011-12-20 DIAGNOSIS — K5900 Constipation, unspecified: Secondary | ICD-10-CM

## 2011-12-20 DIAGNOSIS — R32 Unspecified urinary incontinence: Secondary | ICD-10-CM

## 2011-12-20 LAB — DIFFERENTIAL
Basophils Relative: 0 % (ref 0–1)
Eosinophils Absolute: 0 10*3/uL (ref 0.0–0.7)
Neutrophils Relative %: 76 % (ref 43–77)

## 2011-12-20 LAB — POCT I-STAT TROPONIN I: Troponin i, poc: 0 ng/mL (ref 0.00–0.08)

## 2011-12-20 LAB — BASIC METABOLIC PANEL
BUN: 22 mg/dL (ref 6–23)
Creatinine, Ser: 0.77 mg/dL (ref 0.50–1.10)
GFR calc Af Amer: 83 mL/min — ABNORMAL LOW (ref >90)
GFR calc non Af Amer: 71 mL/min — ABNORMAL LOW (ref >90)
Potassium: 3.3 mEq/L — ABNORMAL LOW (ref 3.5–5.1)

## 2011-12-20 LAB — CBC
HCT: 31.9 % — ABNORMAL LOW (ref 36.0–46.0)
Hemoglobin: 10.4 g/dL — ABNORMAL LOW (ref 12.0–15.0)
MCH: 29.8 pg (ref 26.0–34.0)
MCHC: 33.5 g/dL (ref 30.0–36.0)
Platelets: 224 10*3/uL (ref 150–400)
RBC: 3.55 MIL/uL — ABNORMAL LOW (ref 3.87–5.11)
RDW: 13.5 % (ref 11.5–15.5)
WBC: 7.1 10*3/uL (ref 4.0–10.5)

## 2011-12-20 LAB — URINALYSIS, ROUTINE W REFLEX MICROSCOPIC
Bilirubin Urine: NEGATIVE
Ketones, ur: NEGATIVE mg/dL
Nitrite: NEGATIVE
Urobilinogen, UA: 0.2 mg/dL (ref 0.0–1.0)

## 2011-12-20 LAB — CREATININE, SERUM
GFR calc Af Amer: 83 mL/min — ABNORMAL LOW (ref >90)
GFR calc non Af Amer: 72 mL/min — ABNORMAL LOW (ref >90)

## 2011-12-20 MED ORDER — MORPHINE SULFATE 4 MG/ML IJ SOLN
4.0000 mg | Freq: Once | INTRAMUSCULAR | Status: AC
  Administered 2011-12-20: 4 mg via INTRAVENOUS
  Filled 2011-12-20: qty 1

## 2011-12-20 MED ORDER — SODIUM CHLORIDE 0.9 % IJ SOLN
3.0000 mL | Freq: Two times a day (BID) | INTRAMUSCULAR | Status: DC
  Administered 2011-12-20: 3 mL via INTRAVENOUS

## 2011-12-20 MED ORDER — POLYETHYLENE GLYCOL 3350 17 G PO PACK
17.0000 g | PACK | Freq: Every day | ORAL | Status: DC | PRN
  Filled 2011-12-20: qty 1

## 2011-12-20 MED ORDER — GABAPENTIN 300 MG PO CAPS
300.0000 mg | ORAL_CAPSULE | Freq: Every day | ORAL | Status: DC
  Administered 2011-12-20 – 2011-12-21 (×2): 300 mg via ORAL
  Filled 2011-12-20 (×2): qty 1

## 2011-12-20 MED ORDER — HYDROMORPHONE HCL PF 1 MG/ML IJ SOLN: 1.0000 mg | Freq: Four times a day (QID) | INTRAMUSCULAR | Status: DC | PRN

## 2011-12-20 MED ORDER — DIAZEPAM 5 MG/ML IJ SOLN
5.0000 mg | Freq: Four times a day (QID) | INTRAMUSCULAR | Status: DC | PRN
  Administered 2011-12-20: 5 mg via INTRAVENOUS
  Filled 2011-12-20: qty 2

## 2011-12-20 MED ORDER — ONDANSETRON HCL 4 MG PO TABS: 4.0000 mg | ORAL_TABLET | ORAL | Status: DC | PRN

## 2011-12-20 MED ORDER — ONDANSETRON HCL 4 MG/2ML IJ SOLN
4.0000 mg | INTRAMUSCULAR | Status: DC | PRN
  Administered 2011-12-20: 4 mg via INTRAVENOUS

## 2011-12-20 MED ORDER — ASPIRIN EC 325 MG PO TBEC: 325.0000 mg | DELAYED_RELEASE_TABLET | Freq: Every day | ORAL | Status: DC

## 2011-12-20 MED ORDER — POTASSIUM CHLORIDE CRYS ER 20 MEQ PO TBCR: 20.0000 meq | EXTENDED_RELEASE_TABLET | Freq: Once | ORAL | Status: DC

## 2011-12-20 MED ORDER — HEPARIN SODIUM (PORCINE) 5000 UNIT/ML IJ SOLN
5000.0000 [IU] | Freq: Three times a day (TID) | INTRAMUSCULAR | Status: DC
  Administered 2011-12-20 – 2011-12-21 (×3): 5000 [IU] via SUBCUTANEOUS
  Filled 2011-12-20 (×5): qty 1

## 2011-12-20 MED ORDER — OXYCODONE-ACETAMINOPHEN 5-325 MG PO TABS: 1.0000 | ORAL_TABLET | Freq: Four times a day (QID) | ORAL | Status: DC | PRN

## 2011-12-20 MED ORDER — ASPIRIN EC 325 MG PO TBEC
325.0000 mg | DELAYED_RELEASE_TABLET | Freq: Every day | ORAL | Status: DC
  Administered 2011-12-21: 325 mg via ORAL
  Filled 2011-12-20 (×2): qty 1

## 2011-12-20 MED ORDER — ROPINIROLE HCL 1 MG PO TABS
2.0000 mg | ORAL_TABLET | Freq: Every day | ORAL | Status: DC
  Administered 2011-12-20: 2 mg via ORAL
  Filled 2011-12-20 (×2): qty 2

## 2011-12-20 MED ORDER — HYDROCODONE-ACETAMINOPHEN 10-325 MG PO TABS
1.0000 | ORAL_TABLET | ORAL | Status: DC | PRN
  Administered 2011-12-20: 1 via ORAL
  Filled 2011-12-20: qty 1

## 2011-12-20 MED ORDER — ONDANSETRON HCL 4 MG/2ML IJ SOLN: 4.0000 mg | Freq: Three times a day (TID) | INTRAMUSCULAR | Status: DC | PRN

## 2011-12-20 MED ORDER — SODIUM CHLORIDE 0.9 % IV SOLN: INTRAVENOUS | Status: DC

## 2011-12-20 MED ORDER — HYDROMORPHONE HCL PF 1 MG/ML IJ SOLN
1.0000 mg | INTRAMUSCULAR | Status: DC | PRN
  Administered 2011-12-20: 2 mg via INTRAVENOUS
  Filled 2011-12-20: qty 2

## 2011-12-20 MED ORDER — ONDANSETRON HCL 4 MG/2ML IJ SOLN
4.0000 mg | Freq: Once | INTRAMUSCULAR | Status: AC
  Administered 2011-12-20: 4 mg via INTRAVENOUS
  Filled 2011-12-20: qty 2

## 2011-12-20 MED ORDER — DARIFENACIN HYDROBROMIDE ER 7.5 MG PO TB24
7.5000 mg | ORAL_TABLET | Freq: Every day | ORAL | Status: DC
  Administered 2011-12-21: 7.5 mg via ORAL
  Filled 2011-12-20 (×2): qty 1

## 2011-12-20 MED ORDER — MAGNESIUM CITRATE PO SOLN
1.0000 | Freq: Once | ORAL | Status: AC | PRN
  Filled 2011-12-20: qty 296

## 2011-12-20 MED ORDER — SODIUM CHLORIDE 0.9 % IJ SOLN: 3.0000 mL | INTRAMUSCULAR | Status: DC | PRN

## 2011-12-20 MED ORDER — ONDANSETRON HCL 4 MG/2ML IJ SOLN
4.0000 mg | Freq: Four times a day (QID) | INTRAMUSCULAR | Status: DC | PRN
  Administered 2011-12-20: 4 mg via INTRAVENOUS
  Filled 2011-12-20 (×2): qty 2

## 2011-12-20 MED ORDER — ONDANSETRON HCL 4 MG PO TABS
4.0000 mg | ORAL_TABLET | Freq: Four times a day (QID) | ORAL | Status: DC | PRN
  Filled 2011-12-20: qty 1

## 2011-12-20 MED ORDER — HYDROMORPHONE HCL PF 1 MG/ML IJ SOLN
1.0000 mg | Freq: Once | INTRAMUSCULAR | Status: AC
  Administered 2011-12-20: 1 mg via INTRAVENOUS
  Filled 2011-12-20: qty 1

## 2011-12-20 MED ORDER — MORPHINE SULFATE 4 MG/ML IJ SOLN
4.0000 mg | INTRAMUSCULAR | Status: DC | PRN
  Administered 2011-12-20 (×2): 4 mg via INTRAVENOUS
  Filled 2011-12-20 (×2): qty 1

## 2011-12-20 MED ORDER — SODIUM CHLORIDE 0.9 % IV SOLN: 250.0000 mL | INTRAVENOUS | Status: DC | PRN

## 2011-12-20 MED ORDER — ROPINIROLE HCL 1 MG PO TABS: 2.0000 mg | ORAL_TABLET | Freq: Every day | ORAL | Status: DC

## 2011-12-20 NOTE — ED Notes (Signed)
Patient is in xray at this time,  Family remains in room

## 2011-12-20 NOTE — ED Notes (Signed)
Patient transported to X-ray 

## 2011-12-20 NOTE — ED Notes (Signed)
Patient here for hypertension.  Patient woke up this am feeling nauseated.  Patient took her BP, hypertensive in the 200's systolic.  Patient denies any CP, no shortness of breath.  Patient does have pain in right leg from thigh to ankle.  Pain is 9/10.

## 2011-12-20 NOTE — ED Notes (Signed)
Patient has returned from xray.  She is alert and oriented but reported to have some confusion earlier today.  Patient denies headache,  She is complaining of lower back pain and pain in her right leg.  Patient has had this pain chronically.  bp is much improved at this time.  ermd has been to bedside.  New orders noted.

## 2011-12-20 NOTE — ED Provider Notes (Signed)
History     CSN: [id number]  Arrival date & time 12/20/11  0600   First MD Initiated Contact with Patient 12/20/11 [phone]      Chief Complaint  Patient presents with  . Hypertension    (Consider location/radiation/quality/duration/timing/severity/associated sxs/prior treatment) HPI  78 year old female with history of TIA, hypertension, spinal stenosis, and restless leg syndrome presents complaining of high blood pressure.  Patient has a history of spinal stenosis, and history of pain to her right thigh ongoing for the past several months. This a.m. she noticed that the pain in her right thigh is worsened. Describe pain as a burning sensation which radiates from the thigh down past the knee. Onset is gradual, constant, and not relieving with her regular pain medication. Patient does complain of tingling sensation to the right thigh, states that this is not new. She also experiencing nausea but no headache. Patient decided to check her blood pressure this a.m. and it was elevated above 200. She reports her blood pressure usually fluctuate however has not been this high. Patient reports having recently switched to a new blood pressure medication, as her blood pressure has not been well controlled.  Sts she did take her BP medication this AM. She denies fever, chills, chest pain, shortness of breath, back pain, abdominal pain. She does have history of urinary symptoms and history of diarrhea which has been ongoing for a year and has been evaluated by a doctor. States she just recently has colonoscopy and bladder evaluation performed 2 months ago which does not shows any acute finding.  Past Medical History  Diagnosis Date  . Stroke     Mini, no residual  . Hypertension   . Restless legs syndrome     on Requip  . Ulcer causing bleeding and hole in wall of stomach or small intestine   . Gastric ulcer     years ago  . Arthritis     Back, knees  . Constipation   . Urinary bladder calculus   .  Urinary incontinence   . Bowel incontinence   . Heart murmur     Past Surgical History  Procedure Date  . Colon resection 50 years ago  . Tonsillectomy   . Uterine fibroid surgery   . Lumbar laminectomy/decompression microdiscectomy 06/25/2011    Procedure: LUMBAR LAMINECTOMY/DECOMPRESSION MICRODISCECTOMY;  Surgeon: Hewitt Shorts, MD;  Location: [facility];  Service: Neurosurgery;  Laterality: Right;  RIGHT Lumbar Two-Three hemilaminectomy and microdiskectomy  . Vaginal hysterectomy     Family History  Problem Relation Age of Onset  . Anesthesia problems Neg Hx   . Pancreatic cancer Brother     History  Substance Use Topics  . Smoking status: Never Smoker   . Smokeless tobacco: Never Used  . Alcohol Use: No     rarely    OB History    Grav Para Term Preterm Abortions TAB SAB Ect Mult Living                  Review of Systems  All other systems reviewed and are negative.    Allergies  Hctz  Home Medications   Current Outpatient Rx  Name Route Sig Dispense Refill  . ACETAMINOPHEN 325 MG PO TABS Oral Take 650 mg by mouth every 6 (six) hours as needed. For pain    . AMLODIPINE BESYLATE 5 MG PO TABS Oral Take 5 mg by mouth daily.    . ASPIRIN 325 MG PO TBEC Oral Take 325 mg  by mouth daily.    Marland Kitchen HYDROCODONE-ACETAMINOPHEN 10-325 MG PO TABS Oral Take 1 tablet by mouth every 6 (six) hours as needed.    Marland Kitchen LACTULOSE 10 G PO PACK  Take 1-2 packets bid 30 each 5  . LINACLOTIDE 145 MCG PO CAPS Oral Take 1 tablet by mouth daily before breakfast. 30 capsule 3  . POLYETHYLENE GLYCOL 3350 PO PACK Oral Take 17 g by mouth daily.    Marland Kitchen PREGABALIN 50 MG PO CAPS Oral Take 50 mg by mouth 1 day or 1 dose.    Marland Kitchen PSYLLIUM 28 % PO PACK Oral Take 1 packet by mouth 2 (two) times daily. 3 packet 0  . ROPINIROLE HCL 2 MG PO TABS Oral Take 2 mg by mouth at bedtime.    Marland Kitchen SILVER SULFADIAZINE 1 % EX CREA Topical Apply topically daily. 50 g 0  . SOLIFENACIN SUCCINATE 5 MG PO TABS Oral Take 1  tablet (5 mg total) by mouth daily. 30 tablet 2    There were no vitals taken for this visit.  Physical Exam  Nursing note and vitals reviewed. Constitutional: She appears well-developed and well-nourished. No distress.       Awake, alert, nontoxic appearance  HENT:  Head: Atraumatic.  Mouth/Throat: Oropharynx is clear and moist.  Eyes: Conjunctivae and EOM are normal. Pupils are equal, round, and reactive to light. Right eye exhibits no discharge. Left eye exhibits no discharge.  Neck: Neck supple.  Cardiovascular: Normal rate and regular rhythm.   Murmur heard.      3/6 systolic murmur.  Pulmonary/Chest: Effort normal. No respiratory distress. She exhibits no tenderness.  Abdominal: Soft. There is no tenderness. There is no rebound.  Musculoskeletal: Normal range of motion. She exhibits no tenderness.       ROM appears intact, no obvious focal weakness.    Normal R hip flexion and extension.  R thigh nontender on palpation.  Normal straight leg raise.  Patella DTR 2+ bilat, no foot drops, normal sensation to light touch.  1+ pitting edema to L lower extremity without palpable cord, erythema, or rash.   Neurological: She is alert. She has normal strength. No cranial nerve deficit or sensory deficit. She displays a negative Romberg sign. Coordination and gait normal. GCS eye subscore is 4. GCS verbal subscore is 5. GCS motor subscore is 6.       Mental status and motor strength appears intact.    Skin: No rash noted.  Psychiatric: She has a normal mood and affect.    ED Course  Procedures (including critical care time)  Labs Reviewed - No data to display No results found.   No diagnosis found.   Date: 12/20/2011  Rate: 76  Rhythm: normal sinus rhythm  QRS Axis: left  Intervals: normal  ST/T Wave abnormalities: normal  Conduction Disutrbances:none  Narrative Interpretation:  Late precordial R/S transition, LVH  Old EKG Reviewed: unchanged  Results for orders placed  during the hospital encounter of 12/20/11  BASIC METABOLIC PANEL      Component Value Range   Sodium 144  135 - 145 mEq/L   Potassium 3.3 (*) 3.5 - 5.1 mEq/L   Chloride 103  96 - 112 mEq/L   CO2 29  19 - 32 mEq/L   Glucose, Bld 130 (*) 70 - 99 mg/dL   BUN 22  6 - 23 mg/dL   Creatinine, Ser 4.54  0.50 - 1.10 mg/dL   Calcium 9.3  8.4 - 09.8 mg/dL  GFR calc non Af Amer 71 (*) >90 mL/min   GFR calc Af Amer 83 (*) >90 mL/min  URINALYSIS, ROUTINE W REFLEX MICROSCOPIC      Component Value Range   Color, Urine YELLOW  YELLOW   APPearance CLEAR  CLEAR   Specific Gravity, Urine 1.009  1.005 - 1.030   pH 7.0  5.0 - 8.0   Glucose, UA NEGATIVE  NEGATIVE mg/dL   Hgb urine dipstick NEGATIVE  NEGATIVE   Bilirubin Urine NEGATIVE  NEGATIVE   Ketones, ur NEGATIVE  NEGATIVE mg/dL   Protein, ur NEGATIVE  NEGATIVE mg/dL   Urobilinogen, UA 0.2  0.0 - 1.0 mg/dL   Nitrite NEGATIVE  NEGATIVE   Leukocytes, UA NEGATIVE  NEGATIVE  POCT I-STAT TROPONIN I      Component Value Range   Troponin i, poc 0.00  0.00 - 0.08 ng/mL   Comment 3            Dg Chest 2 View  12/20/2011  *RADIOLOGY REPORT*  Clinical Data: Hypertension.  Heart murmur.  Previous stroke.  CHEST - 2 VIEW  Comparison:  06/20/2011  Findings:  The heart size and mediastinal contours are within normal limits.  Both lungs are clear.  Moderate to severe thoracic spine degenerative changes again noted.  IMPRESSION: Stable exam.  No active cardiopulmonary disease.  Original Report Authenticated By: Danae Orleans, M.D.   Ct Head Wo Contrast  12/20/2011  *RADIOLOGY REPORT*  Clinical Data: Hypertension and nausea.  CT HEAD WITHOUT CONTRAST  Technique:  Contiguous axial images were obtained from the base of the skull through the vertex without contrast.  Comparison: Brain MRI 11/20/2010 and head CT 11/13/2009  Findings: There is mild cerebral atrophy which appears unchanged. Again noted are basal ganglia calcifications.  No evidence for acute hemorrhage,  mass lesion, midline shift, hydrocephalus or large infarct.  There is stable opacification in the lower right mastoid air cells.  Mild mucosal disease in the ethmoid air cells. No acute bony abnormality.  The clivus bone has ground-glass attenuation on the bone windows and this is unchanged.  A bone hemangioma in this area cannot be excluded.  IMPRESSION: No acute intracranial abnormality.  Original Report Authenticated By: Richarda Overlie, M.D.    1. Back pain 2. hypertension   MDM  R thigh pain, with hx of spinal stenosis, radiculopathy and restless leg syndrome.  No obvious finding on exam.  No midline spine tenderness.  Elevated BP, with associated nausea but without headache.  Normal mentation, no focal neuro deficits.  Hx of TIA  L lower leg edema, this is apparently residual from prior tape burn leading to cellulitis that was documented from previous notes.  Doubt DVT.    Urinary and bowel sxs is chronic and has been worked up by PCP with colonoscopy and bladder study.  Will defer to PCP.   Plan to work pt up as hypertensive urgency.  Discussed care with my attending.  Vital signs and medical records were reviewed and considered.  Labs and imaging were reviewed by me.  7:55 AM Blood pressure has decreased to 165 systolic without any intervention. Patient has an unchanged EKG, and a normal troponin. Her head CT is without any acute abnormalities. Chest x-ray is normal. Plan to place patient on any back pain protocol. Will consult her neurosurgeon, Dr. Newell Coral for further pain management.  8:53 AM Patient reports for back pain has improved with current treatment. However still requests to be on the back pain  protocol in the meantime.  10:32 AM I have consult with neurosurgeon, Dr. Newell Coral, who is aware of the patient and recommend for outpatient followup so that she can be set up with pain management for further care.    12:34 PM Pt is currently on back pain protocol.  Shortly after  receiving 5mg  of valium pt appears sedated and her sats drops to 70s.  Sxs improves with non re breather.  My attending was available and has seen pt and talk to family member.  He recommend admitting patient for intractable back pain.    1:38 PM I have consulted with [facility], who agrees to admit patient under observation, telemetry, Team 3.    Fayrene Helper, PA-C 12/20/11 1339

## 2011-12-20 NOTE — ED Notes (Addendum)
Approximately 15 seconds after completion of Valium administration pt's eyes rolled back, O2 sats dropped to the 50's, and pt was unresponsive.  This RN pulled bar for Code Blue notification.  Non-rebreather mask applied and O2 sats back up to 100%.  MD and PA at bedside.  Pt now stable, but sleepy.  Pt is easily aroused and oriented.  Will continue to monitor.

## 2011-12-20 NOTE — ED Notes (Addendum)
Patient woke up feeling nauseated, patient took BP, hypertensive at 220/120.  Patient took a new med for HTN that she started yesterday.  No blurred vision.  Patient does have a headache.  NSR on monitor.  No CP, no shortness of breath.  Patient does have right leg pain, which started out a numbness.

## 2011-12-20 NOTE — ED Notes (Signed)
Patient transported to CT 

## 2011-12-20 NOTE — ED Notes (Signed)
Provider at bedside

## 2011-12-20 NOTE — Progress Notes (Signed)
Patient admitted to room 5505 via stretch from ED. A/O, VSS, c/o unbearable back/ right hip pain, head to toe assessment complete Shelly Martin

## 2011-12-20 NOTE — H&P (Addendum)
[facility] History and Physical  Shelly Martin ZOX:[id number] DOB: [birth year] DOA: 12/20/2011   PCP: Bufford Spikes, DO   Chief Complaint: Lower back pain  HPI:  This is a 78 year old female with past medical history of right L3 and L4 lumbar disc herniation statuspost laminectomy on 06/25/2011 comes in for acute severe lower back pain. She relates this started one day prior to admission progressively getting worst to the point where she cannot move as her pain is so severe. She was brought in to the hospital by her daughter because this pain was not improving at home. The patient relates that movement makes it worse staying still makes it better. She relates some numbness on the lateral proximal aspect of her thigh but no weakness in her lower extremities. She relates she is able to move her bowels with some difficulty, her last bowel movement was before coming into the hospital, no incontinence or retention. She relates her bowels are unchanged as well as her urine habits.   Review of Systems:  Constitutional:  No weight loss, night sweats, Fevers, chills, fatigue.  HEENT:  No headaches, Difficulty swallowing,Tooth/dental problems,Sore throat,  No sneezing, itching, ear ache, nasal congestion, post nasal drip,  Cardio-vascular:  No chest pain, Orthopnea, PND, swelling in lower extremities, anasarca, dizziness, palpitations  GI:  No heartburn, indigestion, abdominal pain, nausea, vomiting, diarrhea, change in bowel habits, loss of appetite  Resp:  No shortness of breath with exertion or at rest. No excess mucus, no productive cough, No non-productive cough, No coughing up of blood.No change in color of mucus.No wheezing.No chest wall deformity  Skin:  no rash or lesions.  GU:  no dysuria, change in color of urine, no urgency or frequency. No flank pain.  Musculoskeletal:  No joint pain or swelling. No decreased range of motion. Psych:  No change in mood or affect. No  depression or anxiety. No memory loss.    Past Medical History  Diagnosis Date  . Stroke     Mini, no residual  . Hypertension   . Restless legs syndrome     on Requip  . Ulcer causing bleeding and hole in wall of stomach or small intestine   . Gastric ulcer     years ago  . Arthritis     Back, knees  . Constipation   . Urinary bladder calculus   . Urinary incontinence   . Bowel incontinence   . Heart murmur    Past Surgical History  Procedure Date  . Colon resection 50 years ago  . Tonsillectomy   . Uterine fibroid surgery   . Lumbar laminectomy/decompression microdiscectomy 06/25/2011    Procedure: LUMBAR LAMINECTOMY/DECOMPRESSION MICRODISCECTOMY;  Surgeon: Hewitt Shorts, MD;  Location: [facility];  Service: Neurosurgery;  Laterality: Right;  RIGHT Lumbar Two-Three hemilaminectomy and microdiskectomy  . Vaginal hysterectomy    Social History:  reports that she has never smoked. She has never used smokeless tobacco. She reports that she does not drink alcohol or use illicit drugs.  Allergies  Allergen Reactions  . Hctz (Hydrochlorothiazide)     nausea    Family History  Problem Relation Age of Onset  . Anesthesia problems Neg Hx   . Pancreatic cancer Brother   . Dementia Mother   . Stroke Father     Prior to Admission medications   Medication Sig Start Date End Date Taking? Authorizing Provider  acetaminophen (TYLENOL) 325 MG tablet Take 650 mg by mouth every 6 (six) hours as needed.  For pain   Yes Historical Provider, MD  aspirin 325 MG EC tablet Take 325 mg by mouth daily.   Yes Historical Provider, MD  gabapentin (NEURONTIN) 300 MG capsule Take 300 mg by mouth daily.   Yes Historical Provider, MD  HYDROcodone-acetaminophen (NORCO) 10-325 MG per tablet Take 1 tablet by mouth every 6 (six) hours as needed. pain   Yes Historical Provider, MD  polyethylene glycol (MIRALAX / GLYCOLAX) packet Take 17 g by mouth daily as needed. constipation   Yes Historical  Provider, MD  rOPINIRole (REQUIP) 2 MG tablet Take 2 mg by mouth at bedtime.   Yes Historical Provider, MD  solifenacin (VESICARE) 5 MG tablet Take 1 tablet (5 mg total) by mouth daily. 08/20/11 08/19/12  Louis Meckel, MD   Physical Exam: Filed Vitals:   12/20/11 1236 12/20/11 1245 12/20/11 1300 12/20/11 1315  BP: 139/54 139/65 135/54 124/49  Pulse:  68 64 59  Temp:      TempSrc:      Resp: 12 12 15 14   SpO2: 100% 100% 100% 100%   BP 124/49  Pulse 59  Temp 99 F (37.2 C) (Oral)  Resp 14  SpO2 100%  General Appearance:    Alert, cooperative, no distress, appears stated age  Head:    Normocephalic, without obvious abnormality, atraumatic  Eyes:    PERRL, conjunctiva/corneas clear, EOM's intact, fundi    benign, both eyes        Throat:   Lips, mucosa, and tongue normal; teeth and gums normal  Neck:   Supple, symmetrical, trachea midline, no adenopathy;    thyroid:  no enlargement/tenderness/nodules; no carotid   bruit or JVD  Back:     Symmetric, no curvature, ROM normal, no CVA tenderness  Lungs:     Clear to auscultation bilaterally, respirations unlabored  Chest Wall:    No tenderness or deformity   Heart:    Regular rate and rhythm, S1 and S2 normal, 1/6 systolic ejection murmur in the aortic area, rub   or gallop     Abdomen:     Soft, non-tender, bowel sounds active all four quadrants,    no masses, no organomegaly        Extremities:   Extremities normal, atraumatic, straight leg is positive the left lower extremity is circumferentially slightly bigger than the right   Pulses:   2+ and symmetric all extremities  Skin:   Skin color, texture, turgor normal, no rashes or lesions  Lymph nodes:   Cervical, supraclavicular, and axillary nodes normal  Neurologic:   CNII-XII intact, normal strength, sensation and reflexes    throughout    Labs on Admission:  Basic Metabolic Panel:  Lab 12/20/11 1914  NA 144  K 3.3*  CL 103  CO2 29  GLUCOSE 130*  BUN 22    CREATININE 0.77  CALCIUM 9.3  MG --  PHOS --   Liver Function Tests: No results found for this basename: AST:5,ALT:5,ALKPHOS:5,BILITOT:5,PROT:5,ALBUMIN:5 in the last 168 hours No results found for this basename: LIPASE:5,AMYLASE:5 in the last 168 hours No results found for this basename: AMMONIA:5 in the last 168 hours CBC:  Lab 12/20/11 0931  WBC 9.7  NEUTROABS 7.3  HGB 11.0*  HCT 32.8*  MCV 88.9  PLT 224   Cardiac Enzymes: No results found for this basename: CKTOTAL:5,CKMB:5,CKMBINDEX:5,TROPONINI:5 in the last 168 hours BNP: No components found with this basename: POCBNP:5 CBG: No results found for this basename: GLUCAP:5 in the last 168 hours  Radiological Exams on Admission: Dg Chest 2 View  12/20/2011  *RADIOLOGY REPORT*  Clinical Data: Hypertension.  Heart murmur.  Previous stroke.  CHEST - 2 VIEW  Comparison:  06/20/2011  Findings:  The heart size and mediastinal contours are within normal limits.  Both lungs are clear.  Moderate to severe thoracic spine degenerative changes again noted.  IMPRESSION: Stable exam.  No active cardiopulmonary disease.  Original Report Authenticated By: Danae Orleans, M.D.   Ct Head Wo Contrast  12/20/2011  *RADIOLOGY REPORT*  Clinical Data: Hypertension and nausea.  CT HEAD WITHOUT CONTRAST  Technique:  Contiguous axial images were obtained from the base of the skull through the vertex without contrast.  Comparison: Brain MRI 11/20/2010 and head CT 11/13/2009  Findings: There is mild cerebral atrophy which appears unchanged. Again noted are basal ganglia calcifications.  No evidence for acute hemorrhage, mass lesion, midline shift, hydrocephalus or large infarct.  There is stable opacification in the lower right mastoid air cells.  Mild mucosal disease in the ethmoid air cells. No acute bony abnormality.  The clivus bone has ground-glass attenuation on the bone windows and this is unchanged.  A bone hemangioma in this area cannot be excluded.   IMPRESSION: No acute intracranial abnormality.  Original Report Authenticated By: Richarda Overlie, M.D.    EKG: Independently reviewed. None   Assessment/Plan: Principal Problem: Lower back pain: -Seems like her pain is slightly better controlled and it was this morning. She still relates back pain especially with any movement. Will admit for 23 hour observation  to regular bed get physical therapy involved start her on high-dose narcotics. And manage her pain accordingly. Also continue her home dose of narcotics. There is straight leg positive but no change in her bowel habits actually her last bowel movement was on the day of admission. We'll get a UA as she complains of urinary incontinence.  -We'll continue her Neurontin and her home dose oral of Norco when necessary.  Hypertension -currently well controlled we'll continue home meds.  Constipation -Continue her MiraLAX  home dose. And that the enemas when necessary.  Urinary incontinence  -We will get a UA to look for a urinary tract infection. She does relate some burning when she urinates and her incontinence is unchanged. We'll continue her oxybutynin.    Time spend: Greater than 45 minutes  Family Communication: Daughter [phone] Disposition Plan: To be determined   Marinda Elk, MD  [facility] Pager [phone]  If 7PM-7AM, please contact night-coverage [email or web address] Password [email or web address] 12/20/2011, 3:17 PM

## 2011-12-21 ENCOUNTER — Observation Stay (HOSPITAL_COMMUNITY): Payer: Medicare Other

## 2011-12-21 LAB — COMPREHENSIVE METABOLIC PANEL
AST: 16 U/L (ref 0–37)
Albumin: 3.2 g/dL — ABNORMAL LOW (ref 3.5–5.2)
Alkaline Phosphatase: 61 U/L (ref 39–117)
Chloride: 105 mEq/L (ref 96–112)
Potassium: 3.8 mEq/L (ref 3.5–5.1)
Total Bilirubin: 0.5 mg/dL (ref 0.3–1.2)

## 2011-12-21 MED ORDER — HYDROCODONE-ACETAMINOPHEN 10-325 MG PO TABS
1.0000 | ORAL_TABLET | ORAL | Status: DC | PRN
  Administered 2011-12-21 (×2): 2 via ORAL
  Filled 2011-12-21 (×2): qty 2

## 2011-12-21 MED ORDER — PROMETHAZINE HCL 25 MG/ML IJ SOLN
12.5000 mg | Freq: Once | INTRAMUSCULAR | Status: AC
  Administered 2011-12-21: 12.5 mg via INTRAVENOUS
  Filled 2011-12-21: qty 1

## 2011-12-21 MED ORDER — HYDROCODONE-ACETAMINOPHEN 10-325 MG PO TABS: 1.0000 | ORAL_TABLET | ORAL | Status: AC | PRN

## 2011-12-21 NOTE — Progress Notes (Signed)
Patient had recurrent bouts of nausea and vomiting (small amounts of clear vomitus) which were not controlled with zofran even when dose frequency was decreased to Q4hours PRN.  Claiborne Billings NP ordered a one time dose of phenergan 12.5mg  and that controlled her nausea.  Please consider ordering phenergan as a refractory dose.  Thanks.

## 2011-12-21 NOTE — Progress Notes (Signed)
Shelly Martin to be D/C'd Home per MD order with [facility] and a rolling walker.  Discussed with the patient and all questions fully answered.   Shelly Martin, Shelly Martin  Home Medication Instructions ZOX:[id number]   Printed on:12/21/11 1809  Medication Information                    aspirin 325 MG EC tablet Take 325 mg by mouth daily.           rOPINIRole (REQUIP) 2 MG tablet Take 2 mg by mouth at bedtime.           acetaminophen (TYLENOL) 325 MG tablet Take 650 mg by mouth every 6 (six) hours as needed. For pain           polyethylene glycol (MIRALAX / GLYCOLAX) packet Take 17 g by mouth daily as needed. constipation           solifenacin (VESICARE) 5 MG tablet Take 1 tablet (5 mg total) by mouth daily.           gabapentin (NEURONTIN) 300 MG capsule Take 300 mg by mouth daily.           HYDROcodone-acetaminophen (NORCO) 10-325 MG per tablet Take 1-2 tablets by mouth every 4 (four) hours as needed.             VVS, Skin clean, dry and intact without evidence of skin break down, no evidence of skin tears noted. IV catheter discontinued intact. Site without signs and symptoms of complications. Dressing and pressure applied.  An After Visit Summary was printed and given to the patient. Follow up appointments  and medication administration times given Patient escorted via WC, and D/C home via private auto.  Cindra Eves, RN 12/21/2011 6:09 PM

## 2011-12-21 NOTE — Progress Notes (Signed)
Physical Therapy Evaluation Patient Details Name: Shelly Martin MRN: [id number] DOB: 23-Jun-1918 Today's Date: 12/21/2011 Time: 4098-1191 PT Time Calculation (min): 38 min  PT Assessment / Plan / Recommendation Clinical Impression  78 yo female admitted with severe back pain presents to PT with back pain more under control, and noted plans to dc home today;   Pt does have balance defecits as well, and recommend youth-sized RW and HHPT follow to continue to address balance defecits in the home;   Also recommend pt continue her follow-up appointments with her Neurosurgeon, Dr. Newell Coral    PT Assessment  All further PT needs can be met in the next venue of care    Follow Up Recommendations  Home health PT;Supervision - Intermittent    Barriers to Discharge        Equipment Recommendations  Rolling walker with 5" wheels (short)    Recommendations for Other Services     Frequency      Precautions / Restrictions Precautions Precautions: Fall   Pertinent Vitals/Pain Reports feeling a "twinge" in her back during amb      Mobility  Bed Mobility Details for Bed Mobility Assistance: Pt presented in chair Transfers Transfers: Sit to Stand;Stand to Sit Sit to Stand: 4: Min guard;From chair/3-in-1;With armrests (without physical contact) Stand to Sit: 4: Min guard;With armrests;To chair/3-in-1 (without physical contact) Details for Transfer Assistance: Cues for hand placement and to grossly follow back precautions Ambulation/Gait Ambulation/Gait Assistance: 4: Min assist;5: Supervision (min A handheld assist; Supervision with RW) Ambulation Distance (Feet): 110 Feet Assistive device: 1 person hand held assist;Rolling walker Ambulation/Gait Assistance Details: Delphina Cahill initially with one person handheld assist, simulating cane, and noted occasional small losses of balance backwards (pt used stepping strategy), also noted occasional tendency to reachout for bilateral UE support; Amb  with Supervision with RW, cues for posture, much more steady Stairs: Yes Stairs Assistance: 4: Min assist Stairs Assistance Details (indicate cue type and reason): cues for technique Stair Management Technique: One rail Left;Sideways Number of Stairs: 2     Exercises     PT Diagnosis: Difficulty walking;Generalized weakness  PT Problem List: Decreased strength;Decreased activity tolerance;Decreased balance;Decreased mobility;Decreased knowledge of use of DME;Pain PT Treatment Interventions:     PT Goals    Visit Information  Last PT Received On: 12/21/11 Assistance Needed: +1    Subjective Data  Subjective: Really enjoys spending time in her backyard Patient Stated Goal: to go home   Prior Functioning  Home Living Lives With: Daughter Available Help at Discharge: Family;Available PRN/intermittently Type of Home: House Home Access: Stairs to enter [employer] of Steps: 2 Entrance Stairs-Rails: Left Home Layout: One level Home Adaptive Equipment: Straight cane Prior Function Level of Independence: Needs assistance Needs Assistance: Light Housekeeping;Meal Prep Able to Take Stairs?: Yes Comments: Daughter works days Musician: No difficulties    Cognition  Overall Cognitive Status: Appears within functional limits for tasks assessed/performed Arousal/Alertness: Awake/alert Orientation Level: Appears intact for tasks assessed Behavior During Session: [facility] for tasks performed Cognition - Other Comments: Noted some decr memory as pt was telling a story of how her family and Dr. Earl Gala family are close and she often mixed up names    Extremity/Trunk Assessment Right Upper Extremity Assessment RUE ROM/Strength/Tone: [facility] for tasks assessed Left Upper Extremity Assessment LUE ROM/Strength/Tone: [facility] for tasks assessed Right Lower Extremity Assessment RLE ROM/Strength/Tone: Deficits RLE ROM/Strength/Tone Deficits: Noted grossly decr strength,  with heavy dependence on UE push for successful sit to stand Left Lower  Extremity Assessment LLE ROM/Strength/Tone: Deficits LLE ROM/Strength/Tone Deficits: Noted grossly decr strength, with heavy dependence on UE push for successful sit to stand   Balance Balance Balance Assessed: No  End of Session    GP Functional Assessment Tool Used: Clinical judgement Functional Limitation: Mobility: Walking and moving around Mobility: Walking and Moving Around Current Status ([id number]): At least 1 percent but less than 20 percent impaired, limited or restricted Mobility: Walking and Moving Around Goal Status [phone]): 0 percent impaired, limited or restricted (HHPT to continue working toward this goal) Mobility: Walking and Moving Around Discharge Status [phone]): At least 1 percent but less than 20 percent impaired, limited or restricted   Van Clines [facility] 12/21/2011, 4:43 PM
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
## 2011-12-21 NOTE — Progress Notes (Addendum)
   CARE MANAGEMENT NOTE 12/21/2011  Patient:  Shelly Martin, Shelly Martin   Account Number:  [id number]  Date Initiated:  12/21/2011  Documentation initiated by:  [facility]  Subjective/Objective Assessment:   lower back pain, HTN     Action/Plan:   Anticipated DC Date:  12/21/2011   Anticipated DC Plan:  HOME W HOME HEALTH SERVICES      DC Planning Services  CM consult      [facility] Choice  HOME HEALTH   Choice offered to / List presented to:  C-1 Patient        HH arranged  HH-2 PT  HH-4 NURSE'S AIDE      HH agency  [facility]   Status of service:  Completed, signed off Medicare Important Message given?   (If response is "NO", the following Medicare IM given date fields will be blank) Date Medicare IM given:   Date Additional Medicare IM given:    Discharge Disposition:  HOME W HOME HEALTH SERVICES  Per UR Regulation:    If discussed at Long Length of Stay Meetings, dates discussed:    Comments:  7/282/2013 1420 Contacted DME rep, Kirt Boys for RW for home.  Spoke to pt. Offered choice for [facility]. Requested [facility] for [facility].  Referral sent through [facility] and message left for [facility] rep. Isidoro Donning RN CCM Case Mgmt phone [phone]

## 2011-12-21 NOTE — ED Provider Notes (Signed)
Medical screening examination/treatment/procedure(s) were conducted as a shared visit with non-physician practitioner(s) and myself.  I personally evaluated the patient during the encounter.  78 yo female with ongoing right leg pain due to neuropathy presents with worsening right leg pain, also with nausea and htn.  HTN has been ongoing, and pt reports "I know when my bp is high because I get nauseated"  No cp, sob, headache.  Will control of pain, pt with better bp control.  Plan for back pain protocol.  Olivia Mackie, MD 12/21/11 [phone]

## 2012-02-16 ENCOUNTER — Encounter (HOSPITAL_COMMUNITY): Payer: Self-pay | Admitting: *Deleted

## 2012-02-16 ENCOUNTER — Emergency Department (HOSPITAL_COMMUNITY)
Admission: EM | Admit: 2012-02-16 | Discharge: 2012-02-16 | Disposition: A | Discharge status: Home / Self Care | Payer: Medicare Other | Attending: Emergency Medicine | Admitting: Emergency Medicine

## 2012-02-16 ENCOUNTER — Ambulatory Visit (INDEPENDENT_AMBULATORY_CARE_PROVIDER_SITE_OTHER): Payer: Medicare Other | Admitting: Family Medicine

## 2012-02-16 VITALS — BP 182/94 | HR 71 | Temp 98.1°F | Resp 16

## 2012-02-16 DIAGNOSIS — G2581 Restless legs syndrome: Secondary | ICD-10-CM | POA: Insufficient documentation

## 2012-02-16 DIAGNOSIS — Z8719 Personal history of other diseases of the digestive system: Secondary | ICD-10-CM

## 2012-02-16 DIAGNOSIS — M533 Sacrococcygeal disorders, not elsewhere classified: Secondary | ICD-10-CM | POA: Insufficient documentation

## 2012-02-16 DIAGNOSIS — R300 Dysuria: Secondary | ICD-10-CM | POA: Insufficient documentation

## 2012-02-16 DIAGNOSIS — Z7982 Long term (current) use of aspirin: Secondary | ICD-10-CM | POA: Insufficient documentation

## 2012-02-16 DIAGNOSIS — R110 Nausea: Secondary | ICD-10-CM

## 2012-02-16 DIAGNOSIS — M545 Low back pain, unspecified: Secondary | ICD-10-CM | POA: Insufficient documentation

## 2012-02-16 DIAGNOSIS — I10 Essential (primary) hypertension: Secondary | ICD-10-CM | POA: Insufficient documentation

## 2012-02-16 DIAGNOSIS — Z79899 Other long term (current) drug therapy: Secondary | ICD-10-CM | POA: Insufficient documentation

## 2012-02-16 DIAGNOSIS — R42 Dizziness and giddiness: Secondary | ICD-10-CM

## 2012-02-16 DIAGNOSIS — Z8673 Personal history of transient ischemic attack (TIA), and cerebral infarction without residual deficits: Secondary | ICD-10-CM

## 2012-02-16 DIAGNOSIS — M1710 Unilateral primary osteoarthritis, unspecified knee: Secondary | ICD-10-CM | POA: Insufficient documentation

## 2012-02-16 DIAGNOSIS — M479 Spondylosis, unspecified: Secondary | ICD-10-CM | POA: Insufficient documentation

## 2012-02-16 DIAGNOSIS — R011 Cardiac murmur, unspecified: Secondary | ICD-10-CM | POA: Insufficient documentation

## 2012-02-16 DIAGNOSIS — Z87442 Personal history of urinary calculi: Secondary | ICD-10-CM | POA: Insufficient documentation

## 2012-02-16 DIAGNOSIS — Z87898 Personal history of other specified conditions: Secondary | ICD-10-CM

## 2012-02-16 DIAGNOSIS — R51 Headache: Secondary | ICD-10-CM

## 2012-02-16 LAB — URINALYSIS, ROUTINE W REFLEX MICROSCOPIC
Bilirubin Urine: NEGATIVE
Hgb urine dipstick: NEGATIVE
Nitrite: NEGATIVE
Specific Gravity, Urine: 1.011 (ref 1.005–1.030)
pH: 7 (ref 5.0–8.0)

## 2012-02-16 MED ORDER — HYDROMORPHONE HCL PF 1 MG/ML IJ SOLN
1.0000 mg | Freq: Once | INTRAMUSCULAR | Status: AC
  Administered 2012-02-16: 1 mg via INTRAMUSCULAR

## 2012-02-16 MED ORDER — HYDROMORPHONE HCL PF 1 MG/ML IJ SOLN
1.0000 mg | Freq: Once | INTRAMUSCULAR | Status: AC
  Administered 2012-02-16: 1 mg via INTRAMUSCULAR
  Filled 2012-02-16: qty 1

## 2012-02-16 NOTE — Progress Notes (Signed)
Subjective:    Patient ID: Shelly Martin, female    DOB: 10-Mar-1919, 78 y.o.   MRN: [id number]  HPI Shelly Martin is a 78 y.o. female Prior primary care provider: DR. Renato Gails, but looking for new provider - may be switching to provider at [facility].  Hx of CVA in past? - mini stroke - TIA.  Hospitalized then.  Had similar symptoms then.  Nausea and dizziness then. Did not have carotid dopplers due to cost.  Hazy vision more past few days.   Currently complains of similar symptoms as in the past - nausea and dizziness started again overnight around 3:30 am. Slight R sided headache - on and off sharp pains.  No facial droop or slurred speech. Pins and needles in both hands - not sure how long these symptoms have been there.  Also complains of pain in lower back - ongoing pain.  Hx of surgery for ruptured disk last January. Morphine few weeks ago for back pain.  Appt with Dr. Jule Ser tomorrow. Taking hydrocodone 5mg  2 every 4 to 6 hours. No recent increase in dosing.  Diarrhea - last 6 weeks, but none in past 2 days.   Review of Systems  Constitutional: Negative for fever.  Eyes: Positive for visual disturbance.  Respiratory: Negative for chest tightness and shortness of breath.   Cardiovascular: Negative for chest pain and palpitations.  Gastrointestinal: Positive for nausea and diarrhea. Negative for vomiting.  Neurological: Positive for dizziness and headaches. Negative for weakness.   As above.     Objective:   Physical Exam  Vitals reviewed. Constitutional: She is oriented to person, place, and time. She appears well-developed and well-nourished.  HENT:  Head: Normocephalic and atraumatic.  Eyes: Conjunctivae normal and EOM are normal. Pupils are equal, round, and reactive to light. Right eye exhibits no nystagmus. Left eye exhibits no nystagmus.  Neck: Carotid bruit is not present.  Cardiovascular: Normal rate, regular rhythm and normal pulses.   No extrasystoles  are present. PMI is not displaced.   Murmur heard.  Systolic murmur is present with a grade of 2/6  Neurological: She is alert and oriented to person, place, and time. She has normal strength. No sensory deficit. She displays a negative Romberg sign. Coordination normal.       Slow gait with cane assistance, but no focal face, arm or leg weakness appreciated. Normal speech.  Skin: Skin is warm and dry.  Psychiatric: She has a normal mood and affect. Her behavior is normal.       Assessment & Plan:  Shelly Martin is a 78 y.o. female 1. Nausea   2. Dizziness   3. Headache   4. Hx of transient ischemic attack (TIA)   5. HTN (hypertension)   6. Low back pain   7. Hx of diarrhea    Hx of multiple medical problems including HTN, chronic LBP with recent worsening, now with similar sx's as prior TIA with dizziness, nausea since 3:30 this morning, with slight elevation in BP, sharp sensation in head, and hazy vision with subjective worsening past few days.  No focal weakness appreciated on exam.  Hydrocodone use for back pain, but denies recent dosing changes.  Sent to [facility] for eval and workup, including possible CT head, and stat lytes with reported hx of diarrhea (but not in past few days).  Discussed  Private vehicle vs EMS transport, but based on timing of symptoms and nonfocal exam - daughter will drive her to ER. Triage nurse  at [facility] ER advised.   LBP - has follow up tomorrow with Neurosurgeon.

## 2012-02-16 NOTE — ED Notes (Signed)
Sandwich meal given per pt request; sitting upright on stretcher - calm, cooperative, pleasant demeanor; states pain still present; however, "not as bad as before"

## 2012-02-16 NOTE — ED Notes (Signed)
To ED for eval of lower back pain and burning with urination. No trauma

## 2012-02-16 NOTE — ED Provider Notes (Signed)
History  This chart was scribed for Charles B. Bernette Mayers, MD by Shari Heritage. The patient was seen in room TR08C/TR08C. Patient's care was started at 1415.     CSN: [id number]  Arrival date & time 02/16/12  1219   First MD Initiated Contact with Patient 02/16/12 1415      Chief Complaint  Patient presents with  . Back Pain  . Dysuria    The history is provided by the patient. No language interpreter was used.    Shelly Martin is a 78 y.o. female with a history of chronic back pain who presents to the Emergency Department complaining of moderate to severe, constant, burning sacral pain and lower back pain that radiates down her right leg onset 2-3 days ago. Patient states that she takes gabapentin and hydrocodone at home, but it hasn't provided any relief today. Patient has also taken Morphine by mouth daily in the past as instructed by her PCP. Patient has a surgical history of lumbar laminectomy (January 2013).  Patient is also complaining of urinary frequency and dysuria. She denies retention problems. Patient states that she has seen her PCP for the same symptoms with no specific diagnosis. Patient has a medical history of HTN, heart murmur and stroke.  Geriatrician - Bufford Spikes  Past Medical History  Diagnosis Date  . Stroke     Mini, no residual  . Hypertension   . Restless legs syndrome     on Requip  . Ulcer causing bleeding and hole in wall of stomach or small intestine   . Gastric ulcer     years ago  . Arthritis     Back, knees  . Constipation   . Urinary bladder calculus   . Urinary incontinence   . Bowel incontinence   . Heart murmur     Past Surgical History  Procedure Date  . Colon resection 50 years ago  . Tonsillectomy   . Uterine fibroid surgery   . Lumbar laminectomy/decompression microdiscectomy 06/25/2011    Procedure: LUMBAR LAMINECTOMY/DECOMPRESSION MICRODISCECTOMY;  Surgeon: Hewitt Shorts, MD;  Location: [facility];  Service:  Neurosurgery;  Laterality: Right;  RIGHT Lumbar Two-Three hemilaminectomy and microdiskectomy  . Vaginal hysterectomy     Family History  Problem Relation Age of Onset  . Anesthesia problems Neg Hx   . Pancreatic cancer Brother   . Dementia Mother   . Stroke Father     History  Substance Use Topics  . Smoking status: Never Smoker   . Smokeless tobacco: Never Used  . Alcohol Use: No     rarely    OB History    Grav Para Term Preterm Abortions TAB SAB Ect Mult Living                  Review of Systems A complete 10 system review of systems was obtained and all systems are negative except as noted in the HPI and PMH.   Allergies  Hctz and Valium  Home Medications   Current Outpatient Rx  Name Route Sig Dispense Refill  . ACETAMINOPHEN 325 MG PO TABS Oral Take 650 mg by mouth every 6 (six) hours as needed. For pain    . ASPIRIN 325 MG PO TBEC Oral Take 325 mg by mouth daily.    Marland Kitchen GABAPENTIN 300 MG PO CAPS Oral Take 300 mg by mouth daily.    Marland Kitchen LOSARTAN POTASSIUM 50 MG PO TABS Oral Take 50 mg by mouth daily.    Marland Kitchen POLYETHYLENE  GLYCOL 3350 PO PACK Oral Take 17 g by mouth daily as needed. constipation    . ROPINIROLE HCL 2 MG PO TABS Oral Take 2 mg by mouth at bedtime.    . SOLIFENACIN SUCCINATE 5 MG PO TABS Oral Take 1 tablet (5 mg total) by mouth daily. 30 tablet 2    BP 110/89  Pulse 71  Temp 98.7 F (37.1 C) (Oral)  Resp 16  SpO2 100%  Physical Exam  Nursing note and vitals reviewed. Constitutional: She is oriented to person, place, and time. She appears well-developed and well-nourished.  HENT:  Head: Normocephalic and atraumatic.  Eyes: EOM are normal. Pupils are equal, round, and reactive to light.  Neck: Normal range of motion. Neck supple.  Cardiovascular: Normal rate, normal heart sounds and intact distal pulses.   Pulmonary/Chest: Effort normal and breath sounds normal.  Abdominal: Bowel sounds are normal. She exhibits no distension. There is no  tenderness.  Musculoskeletal: Normal range of motion. She exhibits tenderness. She exhibits no edema.       Tenderness over sacrum and right sciatic notch.  Neurological: She is alert and oriented to person, place, and time. She has normal strength. No cranial nerve deficit or sensory deficit.  Skin: Skin is warm and dry. No rash noted.  Psychiatric: She has a normal mood and affect.    ED Course  Procedures (including critical care time) DIAGNOSTIC STUDIES: Oxygen Saturation is 100% on room air, normal by my interpretation.    COORDINATION OF CARE: 2:29pm- Patient informed of current plan for treatment and evaluation and agrees with plan at this time. Will administer infection of Dilaudid 1 mg.  Results for orders placed during the hospital encounter of 02/16/12  URINALYSIS, ROUTINE W REFLEX MICROSCOPIC      Component Value Range   Color, Urine YELLOW  YELLOW   APPearance CLEAR  CLEAR   Specific Gravity, Urine 1.011  1.005 - 1.030   pH 7.0  5.0 - 8.0   Glucose, UA NEGATIVE  NEGATIVE mg/dL   Hgb urine dipstick NEGATIVE  NEGATIVE   Bilirubin Urine NEGATIVE  NEGATIVE   Ketones, ur NEGATIVE  NEGATIVE mg/dL   Protein, ur NEGATIVE  NEGATIVE mg/dL   Urobilinogen, UA 0.2  0.0 - 1.0 mg/dL   Nitrite NEGATIVE  NEGATIVE   Leukocytes, UA NEGATIVE  NEGATIVE    No results found.   1. Low back pain       MDM  Pt feeling much better with Dilaudid 1mg  IM but still having moderate pain. Medications redosed, she has appointment with Dr. Newell Coral tomorrow. She has many questions regarding long term pain control which were answered as best I could but referred to Dr. Clayton Lefort for further discussion.      I personally performed the services described in the documentation, which were scribed in my presence. The recorded information has been reviewed and considered.   Charles B. Bernette Mayers, MD 02/16/12 [id number]

## 2012-04-12 ENCOUNTER — Emergency Department (HOSPITAL_COMMUNITY)
Admission: EM | Admit: 2012-04-12 | Discharge: 2012-04-12 | Disposition: A | Discharge status: Home / Self Care | Payer: Medicare Other | Attending: Emergency Medicine | Admitting: Emergency Medicine

## 2012-04-12 ENCOUNTER — Encounter (HOSPITAL_COMMUNITY): Payer: Self-pay | Admitting: *Deleted

## 2012-04-12 DIAGNOSIS — M129 Arthropathy, unspecified: Secondary | ICD-10-CM | POA: Insufficient documentation

## 2012-04-12 DIAGNOSIS — R197 Diarrhea, unspecified: Secondary | ICD-10-CM | POA: Insufficient documentation

## 2012-04-12 DIAGNOSIS — M549 Dorsalgia, unspecified: Secondary | ICD-10-CM | POA: Insufficient documentation

## 2012-04-12 DIAGNOSIS — Z87448 Personal history of other diseases of urinary system: Secondary | ICD-10-CM | POA: Insufficient documentation

## 2012-04-12 DIAGNOSIS — Z8673 Personal history of transient ischemic attack (TIA), and cerebral infarction without residual deficits: Secondary | ICD-10-CM | POA: Insufficient documentation

## 2012-04-12 DIAGNOSIS — G2581 Restless legs syndrome: Secondary | ICD-10-CM | POA: Insufficient documentation

## 2012-04-12 DIAGNOSIS — Z87442 Personal history of urinary calculi: Secondary | ICD-10-CM | POA: Insufficient documentation

## 2012-04-12 DIAGNOSIS — Z8719 Personal history of other diseases of the digestive system: Secondary | ICD-10-CM | POA: Insufficient documentation

## 2012-04-12 DIAGNOSIS — I10 Essential (primary) hypertension: Secondary | ICD-10-CM | POA: Insufficient documentation

## 2012-04-12 DIAGNOSIS — R110 Nausea: Secondary | ICD-10-CM | POA: Insufficient documentation

## 2012-04-12 LAB — URINALYSIS, ROUTINE W REFLEX MICROSCOPIC
Bilirubin Urine: NEGATIVE
Glucose, UA: NEGATIVE mg/dL
Hgb urine dipstick: NEGATIVE
Ketones, ur: NEGATIVE mg/dL
Leukocytes, UA: NEGATIVE
Nitrite: NEGATIVE
Protein, ur: NEGATIVE mg/dL
Specific Gravity, Urine: 1.013 (ref 1.005–1.030)
Urobilinogen, UA: 0.2 mg/dL (ref 0.0–1.0)
pH: 8 (ref 5.0–8.0)

## 2012-04-12 LAB — CBC WITH DIFFERENTIAL/PLATELET
Eosinophils Absolute: 0 10*3/uL (ref 0.0–0.7)
Hemoglobin: 10.6 g/dL — ABNORMAL LOW (ref 12.0–15.0)
Lymphocytes Relative: 20 % (ref 12–46)
Lymphs Abs: 1.1 10*3/uL (ref 0.7–4.0)
MCH: 29.5 pg (ref 26.0–34.0)
Monocytes Relative: 8 % (ref 3–12)
Neutrophils Relative %: 71 % (ref 43–77)
RBC: 3.59 MIL/uL — ABNORMAL LOW (ref 3.87–5.11)
WBC: 5.3 10*3/uL (ref 4.0–10.5)

## 2012-04-12 LAB — COMPREHENSIVE METABOLIC PANEL
ALT: 8 U/L (ref 0–35)
Alkaline Phosphatase: 59 U/L (ref 39–117)
BUN: 18 mg/dL (ref 6–23)
CO2: 29 mEq/L (ref 19–32)
Chloride: 104 mEq/L (ref 96–112)
GFR calc Af Amer: 84 mL/min — ABNORMAL LOW (ref >90)
GFR calc non Af Amer: 72 mL/min — ABNORMAL LOW (ref >90)
Glucose, Bld: 118 mg/dL — ABNORMAL HIGH (ref 70–99)
Potassium: 3.9 mEq/L (ref 3.5–5.1)
Total Bilirubin: 0.5 mg/dL (ref 0.3–1.2)
Total Protein: 5.8 g/dL — ABNORMAL LOW (ref 6.0–8.3)

## 2012-04-12 MED ORDER — HYDROCODONE-ACETAMINOPHEN 5-325 MG PO TABS
1.0000 | ORAL_TABLET | Freq: Once | ORAL | Status: AC
  Administered 2012-04-12: 1 via ORAL
  Filled 2012-04-12: qty 1

## 2012-04-12 MED ORDER — DIPHENOXYLATE-ATROPINE 2.5-0.025 MG PO TABS: 2.0000 | ORAL_TABLET | Freq: Four times a day (QID) | ORAL | Status: DC | PRN

## 2012-04-12 MED ORDER — ONDANSETRON HCL 4 MG/2ML IJ SOLN
4.0000 mg | Freq: Once | INTRAMUSCULAR | Status: AC
  Administered 2012-04-12: 4 mg via INTRAVENOUS
  Filled 2012-04-12: qty 2

## 2012-04-12 MED ORDER — HYDROMORPHONE HCL PF 1 MG/ML IJ SOLN
1.0000 mg | Freq: Once | INTRAMUSCULAR | Status: AC
  Administered 2012-04-12: 1 mg via INTRAVENOUS
  Filled 2012-04-12: qty 1

## 2012-04-12 MED ORDER — SODIUM CHLORIDE 0.9 % IV SOLN
INTRAVENOUS | Status: DC
  Administered 2012-04-12: 250 mL/h via INTRAVENOUS

## 2012-04-12 NOTE — ED Provider Notes (Signed)
History     CSN: [id number]  Arrival date & time 04/12/12  0803   First MD Initiated Contact with Patient 04/12/12 [phone]      Chief Complaint  Patient presents with  . Diarrhea  . Nausea    (Consider location/radiation/quality/duration/timing/severity/associated sxs/prior treatment) HPI Comments: A 78 year old woman who has an ongoing issue with loose bowel movements. Much worse, with explosive loose bowel movements and diarrhea. She has also noticed painful urination. She is noted that his blood pressure is been high. She also has chronic back pain, notes pain that runs from her right buttock into her right leg. For worsening diarrhea, she seeks evaluation.  Patient is a 78 y.o. female presenting with diarrhea. The history is provided by the patient and medical records. No language interpreter was used.  Diarrhea The primary symptoms include diarrhea. Primary symptoms do not include fever. Episode onset: Loose bowel movements ongoing, but much worse yesterday. The problem has been rapidly worsening.  The illness is also significant for back pain. The illness does not include chills. Associated symptoms comments: On antibiotic medications recently and has not been hospitalized recently, so her risk of C. Difficile diarrhea is low.. Associated medical issues comments: High blood pressure, chronic back pain..    Past Medical History  Diagnosis Date  . Stroke     Mini, no residual  . Hypertension   . Restless legs syndrome     on Requip  . Ulcer causing bleeding and hole in wall of stomach or small intestine   . Gastric ulcer     years ago  . Arthritis     Back, knees  . Constipation   . Urinary bladder calculus   . Urinary incontinence   . Bowel incontinence   . Heart murmur     Past Surgical History  Procedure Date  . Colon resection 50 years ago  . Tonsillectomy   . Uterine fibroid surgery   . Lumbar laminectomy/decompression microdiscectomy 06/25/2011    Procedure:  LUMBAR LAMINECTOMY/DECOMPRESSION MICRODISCECTOMY;  Surgeon: Hewitt Shorts, MD;  Location: [facility];  Service: Neurosurgery;  Laterality: Right;  RIGHT Lumbar Two-Three hemilaminectomy and microdiskectomy  . Vaginal hysterectomy     Family History  Problem Relation Age of Onset  . Anesthesia problems Neg Hx   . Pancreatic cancer Brother   . Dementia Mother   . Stroke Father     History  Substance Use Topics  . Smoking status: Never Smoker   . Smokeless tobacco: Never Used  . Alcohol Use: No     Comment: rarely    OB History    Grav Para Term Preterm Abortions TAB SAB Ect Mult Living                  Review of Systems  Constitutional: Negative for fever and chills.  HENT: Negative.   Eyes: Negative.   Respiratory: Negative.   Cardiovascular:        High blood pressure  Gastrointestinal: Positive for diarrhea.  Genitourinary: Negative.   Musculoskeletal: Positive for back pain.  Skin: Negative.   Neurological: Negative.   Psychiatric/Behavioral: Negative.     Allergies  Baclofen; Hctz; Oxycodone; and Valium  Home Medications   Current Outpatient Rx  Name  Route  Sig  Dispense  Refill  . GABAPENTIN 300 MG PO CAPS   Oral   Take 300 mg by mouth daily.         Marland Kitchen LOSARTAN POTASSIUM 50 MG PO TABS  Oral   Take 50 mg by mouth daily.         . OXYCODONE HCL 5 MG PO TABS   Oral   Take 5 mg by mouth every 6 (six) hours as needed. For pain         . ROPINIROLE HCL 2 MG PO TABS   Oral   Take 2 mg by mouth at bedtime.           BP 174/69  Temp 98 F (36.7 C) (Oral)  Resp 18  SpO2 98%  Physical Exam  Nursing note and vitals reviewed. Constitutional: She is oriented to person, place, and time.       Elderly woman, no distress.  HENT:  Head: Normocephalic and atraumatic.  Right Ear: External ear normal.  Left Ear: External ear normal.  Mouth/Throat: Oropharynx is clear and moist.  Eyes: Conjunctivae normal and EOM are normal. Pupils are  equal, round, and reactive to light.  Neck: Normal range of motion. Neck supple.  Cardiovascular: Normal rate, regular rhythm and normal heart sounds.   Pulmonary/Chest: Effort normal and breath sounds normal.  Abdominal: Soft. Bowel sounds are normal. She exhibits no distension. There is no tenderness. There is no rebound and no guarding.  Musculoskeletal:       Back pain is localized to right buttock, with radiation down the right leg. There is no palpable bony deformity or tenderness.  Neurological: She is alert and oriented to person, place, and time.       No sensory or motor deficit.  Skin: Skin is warm and dry.  Psychiatric: She has a normal mood and affect. Her behavior is normal.    ED Course  Procedures (including critical care time)   Labs Reviewed  CBC WITH DIFFERENTIAL  COMPREHENSIVE METABOLIC PANEL  URINALYSIS, ROUTINE W REFLEX MICROSCOPIC  URINE CULTURE   9:02 AM Pt seen --> physical exam performed.  Lab workup ordered.  IV fluids, IV medications for pain and nausea ordered.  10:47 AM Lab tests reassuringly normal  Will Rx with Lomotil, 2 tabs q4h prn diarrhea.  Advised and released.  1. Diarrhea        Carleene Cooper III, MD 04/14/12 [phone]

## 2012-04-12 NOTE — ED Notes (Signed)
Patient states diarrhea x 1 day, mostly last night, patient states also nausea with associated scant emesis, patient received Zofran 8 mg pta, patient also states unable to control bladder and states she had to urinate multiple times last night without being able to make it to the restroom

## 2012-04-12 NOTE — ED Notes (Signed)
Jan, daughter called to pick up patient,

## 2012-04-13 LAB — URINE CULTURE

## 2012-04-15 ENCOUNTER — Encounter: Payer: Self-pay | Admitting: Internal Medicine

## 2012-04-15 ENCOUNTER — Ambulatory Visit (INDEPENDENT_AMBULATORY_CARE_PROVIDER_SITE_OTHER): Payer: Medicare Other | Admitting: Internal Medicine

## 2012-04-15 VITALS — BP 144/82 | HR 74 | Temp 98.5°F | Resp 18

## 2012-04-15 DIAGNOSIS — G8929 Other chronic pain: Secondary | ICD-10-CM

## 2012-04-15 DIAGNOSIS — Z23 Encounter for immunization: Secondary | ICD-10-CM

## 2012-04-15 DIAGNOSIS — Z90710 Acquired absence of both cervix and uterus: Secondary | ICD-10-CM

## 2012-04-15 DIAGNOSIS — Z139 Encounter for screening, unspecified: Secondary | ICD-10-CM

## 2012-04-15 DIAGNOSIS — M48061 Spinal stenosis, lumbar region without neurogenic claudication: Secondary | ICD-10-CM

## 2012-04-15 DIAGNOSIS — Z9079 Acquired absence of other genital organ(s): Secondary | ICD-10-CM

## 2012-04-15 DIAGNOSIS — I10 Essential (primary) hypertension: Secondary | ICD-10-CM

## 2012-04-15 DIAGNOSIS — M549 Dorsalgia, unspecified: Secondary | ICD-10-CM

## 2012-04-15 DIAGNOSIS — Z8673 Personal history of transient ischemic attack (TIA), and cerebral infarction without residual deficits: Secondary | ICD-10-CM

## 2012-04-15 DIAGNOSIS — Z862 Personal history of diseases of the blood and blood-forming organs and certain disorders involving the immune mechanism: Secondary | ICD-10-CM

## 2012-04-15 DIAGNOSIS — H9190 Unspecified hearing loss, unspecified ear: Secondary | ICD-10-CM

## 2012-04-15 DIAGNOSIS — G2581 Restless legs syndrome: Secondary | ICD-10-CM

## 2012-04-15 LAB — POCT URINALYSIS DIPSTICK
Glucose, UA: NEGATIVE
Spec Grav, UA: 1.015
Urobilinogen, UA: NEGATIVE

## 2012-04-15 MED ORDER — PROMETHAZINE HCL 12.5 MG PO TABS: 12.5000 mg | ORAL_TABLET | Freq: Three times a day (TID) | ORAL | Status: DC | PRN

## 2012-04-15 MED ORDER — OXYCODONE-ACETAMINOPHEN 7.5-500 MG PO TABS: ORAL_TABLET | ORAL | Status: DC

## 2012-04-15 NOTE — Patient Instructions (Addendum)
Take your pain pill every 6 hours as needed.  Take it with a nausea pill  Take your losartan twice a day.  Take the second dose after dinner  See me in two weeks

## 2012-04-16 ENCOUNTER — Other Ambulatory Visit: Payer: Self-pay | Admitting: *Deleted

## 2012-04-16 ENCOUNTER — Other Ambulatory Visit: Payer: Self-pay | Admitting: Internal Medicine

## 2012-04-16 DIAGNOSIS — R399 Unspecified symptoms and signs involving the genitourinary system: Secondary | ICD-10-CM

## 2012-04-16 LAB — CBC WITH DIFFERENTIAL/PLATELET
Basophils Absolute: 0 10*3/uL (ref 0.0–0.1)
Eosinophils Absolute: 0 10*3/uL (ref 0.0–0.7)
Eosinophils Relative: 1 % (ref 0–5)
MCH: 29.5 pg (ref 26.0–34.0)
MCV: 87.6 fL (ref 78.0–100.0)
Platelets: 286 10*3/uL (ref 150–400)
RDW: 13.6 % (ref 11.5–15.5)

## 2012-04-16 LAB — COMPREHENSIVE METABOLIC PANEL
Albumin: 4.5 g/dL (ref 3.5–5.2)
Alkaline Phosphatase: 68 U/L (ref 39–117)
BUN: 22 mg/dL (ref 6–23)
Glucose, Bld: 83 mg/dL (ref 70–99)
Potassium: 7 mEq/L (ref 3.5–5.3)
Total Bilirubin: 0.8 mg/dL (ref 0.3–1.2)

## 2012-04-17 ENCOUNTER — Telehealth: Payer: Self-pay | Admitting: Internal Medicine

## 2012-04-17 ENCOUNTER — Encounter: Payer: Self-pay | Admitting: Internal Medicine

## 2012-04-17 DIAGNOSIS — Z862 Personal history of diseases of the blood and blood-forming organs and certain disorders involving the immune mechanism: Secondary | ICD-10-CM

## 2012-04-17 DIAGNOSIS — M48061 Spinal stenosis, lumbar region without neurogenic claudication: Secondary | ICD-10-CM | POA: Insufficient documentation

## 2012-04-17 DIAGNOSIS — Z90710 Acquired absence of both cervix and uterus: Secondary | ICD-10-CM | POA: Insufficient documentation

## 2012-04-17 DIAGNOSIS — Z8673 Personal history of transient ischemic attack (TIA), and cerebral infarction without residual deficits: Secondary | ICD-10-CM

## 2012-04-17 DIAGNOSIS — Z9079 Acquired absence of other genital organ(s): Secondary | ICD-10-CM | POA: Insufficient documentation

## 2012-04-17 DIAGNOSIS — H9190 Unspecified hearing loss, unspecified ear: Secondary | ICD-10-CM | POA: Insufficient documentation

## 2012-04-17 DIAGNOSIS — G2581 Restless legs syndrome: Secondary | ICD-10-CM | POA: Insufficient documentation

## 2012-04-17 DIAGNOSIS — M549 Dorsalgia, unspecified: Secondary | ICD-10-CM | POA: Insufficient documentation

## 2012-04-17 HISTORY — PX: TOTAL ABDOMINAL HYSTERECTOMY W/ BILATERAL SALPINGOOPHORECTOMY: SHX83

## 2012-04-17 HISTORY — DX: Acquired absence of other genital organ(s): Z90.710

## 2012-04-17 HISTORY — DX: Personal history of diseases of the blood and blood-forming organs and certain disorders involving the immune mechanism: Z86.2

## 2012-04-17 HISTORY — DX: Personal history of transient ischemic attack (TIA), and cerebral infarction without residual deficits: Z86.73

## 2012-04-17 HISTORY — DX: Spinal stenosis, lumbar region without neurogenic claudication: M48.061

## 2012-04-17 NOTE — Telephone Encounter (Signed)
Attempted to call pt at 8:45 am regarding high potassium  .  No answer.  I left voice mail.  Will try to call again later today

## 2012-04-17 NOTE — Telephone Encounter (Signed)
Spoke with pt and informed of high K and need to repeat blood test.    Will have pts daughter take pt to [facility] urgent care for repeat K.  K was normal value on 11/18  ?? Hemolysis  If K still high will have to stop Losartan.  Pt counseled to hold the evening dose of Losartan for now until repeat K done.    Daughter working today (Saturday)  But will counsel her to take her mother as soon as is possible.   I will call daughter at her workplace and inform.     Pt voices understanding.  Daughter has not picked up new pain medicine as yet

## 2012-04-17 NOTE — Progress Notes (Signed)
Subjective:    Patient ID: Farin Buhman, female    DOB: 28-Feb-1919, 78 y.o.   MRN: [id number]  HPI New pt here for first visit.  [name] is accompanied by Harlow Asa from [facility] and pt states Dondra Spry has her permission to know all of the pts medical information.   Pt lives with her daughter Liborio Nixon.   [name] has a complicated PMH that includes TIA hospitalized in 10/2009, difficult to control HTN (now on Losartan), difficult to control L/S spine pain (severe spinal stenosis and S/P L3/L4 laminectomy Dr. Newell Coral) , anemia,  Urinary incontinence, restless legs syndrome,  and intermittant diarrhea with constipation  (Dr. Arlyce Dice).  Appetite is reduced and she is losing weight but she is also very nauseated.  She is also S/P Hysterectomy with BSO  [name] reports she woke up in the night a few days ago due to severe back pain , her BP was around 200, EMS was called.  She has been prescribed Losartan 50 mg daily but pt has been taking 50 mg twice a day.  She has been on oxycodone 5/325 and hydrocodone 5/325 but this does not control the pain.  She has been using every 4 hours.  She does not have anything for nausea.  She has not had a BM in 3 days.  She has been on Requip for years for her restless legs syndrome.  Allergies  Allergen Reactions  . Baclofen Other (See Comments)    "fuzzy in the eyes" and dizzy  . Hctz (Hydrochlorothiazide)     nausea  . Oxycodone   . Valium (Diazepam) Other (See Comments)    Pt became unresponsive and O2 sats dropped.   Past Medical History  Diagnosis Date  . Stroke     Mini, no residual  . Hypertension   . Restless legs syndrome     on Requip  . Ulcer causing bleeding and hole in wall of stomach or small intestine   . Gastric ulcer     years ago  . Arthritis     Back, knees  . Constipation   . Urinary bladder calculus   . Urinary incontinence   . Bowel incontinence   . Heart murmur    Past Surgical History  Procedure Date  . Colon  resection 50 years ago  . Tonsillectomy   . Uterine fibroid surgery   . Lumbar laminectomy/decompression microdiscectomy 06/25/2011    Procedure: LUMBAR LAMINECTOMY/DECOMPRESSION MICRODISCECTOMY;  Surgeon: Hewitt Shorts, MD;  Location: [facility];  Service: Neurosurgery;  Laterality: Right;  RIGHT Lumbar Two-Three hemilaminectomy and microdiskectomy  . Vaginal hysterectomy    History   Social History  . Marital Status: Widowed    Spouse Name: N/A    Number of Children: 2  . Years of Education: N/A   Occupational History  . Retired    Social History Main Topics  . Smoking status: Never Smoker   . Smokeless tobacco: Never Used  . Alcohol Use: No     Comment: rarely  . Drug Use: No  . Sexually Active: Not Currently   Other Topics Concern  . Not on file   Social History Narrative  . No narrative on file   Family History  Problem Relation Age of Onset  . Anesthesia problems Neg Hx   . Pancreatic cancer Brother   . Dementia Mother   . Stroke Father    Patient Active Problem List  Diagnosis  . Constipation  . Urinary incontinence  .  H/O: CVA (cardiovascular accident)  . Hypertension  . Restless legs syndrome  . Personal history of colonic polyps  . Lower back pain  . History of TIAs  . S/P total hysterectomy and bilateral salpingo-oophorectomy  . Hard of hearing  . History of anemia   Current Outpatient Prescriptions on File Prior to Visit  Medication Sig Dispense Refill  . gabapentin (NEURONTIN) 300 MG capsule Take 300 mg by mouth daily.      Marland Kitchen losartan (COZAAR) 50 MG tablet Take 50 mg by mouth daily.      Marland Kitchen rOPINIRole (REQUIP) 2 MG tablet Take 2 mg by mouth at bedtime.      . diphenoxylate-atropine (LOMOTIL) 2.5-0.025 MG per tablet Take 2 tablets by mouth 4 (four) times daily as needed for diarrhea or loose stools.  20 tablet  0  . promethazine (PHENERGAN) 12.5 MG tablet Take 1 tablet (12.5 mg total) by mouth every 8 (eight) hours as needed for nausea.  20  tablet  1        Review of Systems See HPI    Objective:   Physical Exam Physical Exam  Nursing note and vitals reviewed.   Alert, pleasant.  Able to answer all questions.  Pt is hard of hearing Constitutional: She is oriented to person, place, and time. She appears well-developed and well-nourished.  HENT:  Head: Normocephalic and atraumatic.  Cardiovascular: Normal rate and regular rhythm. Exam reveals no gallop and no friction rub.  No murmur heard.  Pulmonary/Chest: Breath sounds normal. She has no wheezes. She has no rales.  Neurological: She is alert and oriented to person, place, and time. [name] is walking with a cane  Skin: Skin is warm and dry.  Psychiatric: She has a normal mood and affect. Her behavior is normal.              Assessment & Plan:  Difficult to control HTN.  BP reasonable today.  OK to take Losartan 50 mg bid for now.  K  On 11/18 was normal. Will check chemistries today  Severe Lumbar stenois/ S/P laminectomy/ Severe DJD:  She has sciatica clinically.  Will need slow escalation of narcotics and likely referral for chronic pain management.  Will call Dr. Newell Coral to obtain his input.  Ok to try Percocet 7.5/500 mg q6h prn.  Will also add phenergan for nausea control.    Wt loss/nausea  Nausea may be due to chronic narcotic use.  Will add phernergan 12.5 mg to be taken with her Percocet  See U/A  Will send for culture  Intermittant diarrhea:  She has not had a BM in 3 days now.  Narcotics likely to induce constipation.  Will need to moniter this for now.    History of anemia  Will check today  RLS on Requip  Will give flu vaccine today

## 2012-04-18 ENCOUNTER — Emergency Department (HOSPITAL_COMMUNITY)
Admission: EM | Admit: 2012-04-18 | Discharge: 2012-04-18 | Disposition: A | Discharge status: Home / Self Care | Payer: Medicare Other | Attending: Emergency Medicine | Admitting: Emergency Medicine

## 2012-04-18 ENCOUNTER — Encounter (HOSPITAL_COMMUNITY): Payer: Self-pay | Admitting: Emergency Medicine

## 2012-04-18 ENCOUNTER — Ambulatory Visit (INDEPENDENT_AMBULATORY_CARE_PROVIDER_SITE_OTHER): Payer: Medicare Other | Admitting: Emergency Medicine

## 2012-04-18 VITALS — BP 125/60 | HR 80 | Temp 98.2°F | Resp 18 | Wt 123.0 lb

## 2012-04-18 DIAGNOSIS — G2581 Restless legs syndrome: Secondary | ICD-10-CM | POA: Insufficient documentation

## 2012-04-18 DIAGNOSIS — R82998 Other abnormal findings in urine: Secondary | ICD-10-CM | POA: Insufficient documentation

## 2012-04-18 DIAGNOSIS — Z0189 Encounter for other specified special examinations: Secondary | ICD-10-CM

## 2012-04-18 DIAGNOSIS — E875 Hyperkalemia: Secondary | ICD-10-CM

## 2012-04-18 DIAGNOSIS — Z8719 Personal history of other diseases of the digestive system: Secondary | ICD-10-CM | POA: Insufficient documentation

## 2012-04-18 DIAGNOSIS — I10 Essential (primary) hypertension: Secondary | ICD-10-CM | POA: Insufficient documentation

## 2012-04-18 DIAGNOSIS — Z8673 Personal history of transient ischemic attack (TIA), and cerebral infarction without residual deficits: Secondary | ICD-10-CM | POA: Insufficient documentation

## 2012-04-18 DIAGNOSIS — M129 Arthropathy, unspecified: Secondary | ICD-10-CM | POA: Insufficient documentation

## 2012-04-18 DIAGNOSIS — N390 Urinary tract infection, site not specified: Secondary | ICD-10-CM

## 2012-04-18 DIAGNOSIS — Z79899 Other long term (current) drug therapy: Secondary | ICD-10-CM | POA: Insufficient documentation

## 2012-04-18 DIAGNOSIS — Z8679 Personal history of other diseases of the circulatory system: Secondary | ICD-10-CM | POA: Insufficient documentation

## 2012-04-18 LAB — BASIC METABOLIC PANEL
CO2: 29 mEq/L (ref 19–32)
Calcium: 8.8 mg/dL (ref 8.4–10.5)
Chloride: 101 mEq/L (ref 96–112)
Glucose, Bld: 97 mg/dL (ref 70–99)
Sodium: 138 mEq/L (ref 135–145)

## 2012-04-18 MED ORDER — AMOXICILLIN 500 MG PO CAPS
500.0000 mg | ORAL_CAPSULE | Freq: Once | ORAL | Status: AC
  Administered 2012-04-18: 500 mg via ORAL
  Filled 2012-04-18: qty 1

## 2012-04-18 MED ORDER — AMOXICILLIN 500 MG PO CAPS: 500.0000 mg | ORAL_CAPSULE | Freq: Three times a day (TID) | ORAL | Status: DC

## 2012-04-18 NOTE — ED Provider Notes (Signed)
History     CSN: [id number] Arrival date & time 04/18/12  1612 First MD Initiated Contact with Patient 04/18/12 1700     CC: Elevated potassium HPI Pt was seen in the ED on Nov 18 th.  She had been having issues with diarrhea and dysuria. She also has been having trouble with chronic leg pain felt to be radicular in nature.  Her primary issue that brought her to the ED was diarrhea.  Pt was treated and released.  At that time, her electrolytes and k were normal.  Pt followed up with her doctor on the 23 rd and had a repeat electrolyte panel.  The potassium was reported elevated at 7 from the specimen on the 23 rd.  Her PCP was contacted with the result and the patient was instructed to get it rechecked emergently.  She initially was seen at [facility] but because of the 4 hour turn around time she was sent to the ED. She is having burning with urination.  No vomiting or diarrhea today.   She denies weakness, chest pain or shortness of breath. Her PCP, Dr Leonie Man kindly called the ED to notify us of her arrival and the need to recheck her potassium and treat her uti. Past Medical History  Diagnosis Date  . Stroke     Mini, no residual  . Hypertension   . Restless legs syndrome     on Requip  . Ulcer causing bleeding and hole in wall of stomach or small intestine   . Gastric ulcer     years ago  . Arthritis     Back, knees  . Constipation   . Urinary bladder calculus   . Urinary incontinence   . Bowel incontinence   . Heart murmur     Past Surgical History  Procedure Date  . Colon resection 50 years ago  . Tonsillectomy   . Uterine fibroid surgery   . Lumbar laminectomy/decompression microdiscectomy 06/25/2011    Procedure: LUMBAR LAMINECTOMY/DECOMPRESSION MICRODISCECTOMY;  Surgeon: Hewitt Shorts, MD;  Location: [facility];  Service: Neurosurgery;  Laterality: Right;  RIGHT Lumbar Two-Three hemilaminectomy and microdiskectomy  . Vaginal hysterectomy     Family  History  Problem Relation Age of Onset  . Anesthesia problems Neg Hx   . Pancreatic cancer Brother   . Dementia Mother   . Stroke Father     History  Substance Use Topics  . Smoking status: Never Smoker   . Smokeless tobacco: Never Used  . Alcohol Use: No     Comment: rarely    OB History    Grav Para Term Preterm Abortions TAB SAB Ect Mult Living                  Review of Systems  All other systems reviewed and are negative.    Allergies  Baclofen; Hctz; Oxycodone; and Valium  Home Medications   Current Outpatient Rx  Name  Route  Sig  Dispense  Refill  . DIPHENOXYLATE-ATROPINE 2.5-0.025 MG PO TABS   Oral   Take 2 tablets by mouth 4 (four) times daily as needed for diarrhea or loose stools.   20 tablet   0   . GABAPENTIN 300 MG PO CAPS   Oral   Take 300 mg by mouth daily.         Marland Kitchen LOSARTAN POTASSIUM 50 MG PO TABS   Oral   Take 50 mg by mouth daily.         Marland Kitchen  OXYCODONE-ACETAMINOPHEN 7.5-500 MG PO TABS      Take one tablet every 6 hours as needed for pain   30 tablet   0   . PROMETHAZINE HCL 12.5 MG PO TABS   Oral   Take 1 tablet (12.5 mg total) by mouth every 8 (eight) hours as needed for nausea.   20 tablet   1   . ROPINIROLE HCL 2 MG PO TABS   Oral   Take 2 mg by mouth at bedtime.           BP 148/57  Pulse 72  Temp 97.7 F (36.5 C) (Oral)  Resp 16  SpO2 95%  Physical Exam  Nursing note and vitals reviewed. Constitutional: She appears well-developed and well-nourished. No distress.  HENT:  Head: Normocephalic and atraumatic.  Right Ear: External ear normal.  Left Ear: External ear normal.  Eyes: Conjunctivae normal are normal. Right eye exhibits no discharge. Left eye exhibits no discharge. No scleral icterus.  Neck: Neck supple. No tracheal deviation present.  Cardiovascular: Normal rate, regular rhythm and normal heart sounds.   Pulmonary/Chest: Effort normal and breath sounds normal. No stridor. No respiratory distress.  She has no wheezes.  Abdominal: She exhibits no distension. There is no tenderness. There is no rebound and no guarding.  Musculoskeletal: She exhibits no edema.  Neurological: She is alert. Cranial nerve deficit: no gross deficits.  Skin: Skin is warm and dry. No rash noted.  Psychiatric: She has a normal mood and affect.    ED Course  Procedures (including critical care time) Reviewed urine specimen from recent visit.  Urine culture shows enterococcus. Labs Reviewed  BASIC METABOLIC PANEL - Abnormal; Notable for the following:    BUN 30 (*)     GFR calc non Af Amer 50 (*)     GFR calc Af Amer 58 (*)     All other components within normal limits   No results found.   1. Laboratory test   2. UTI (lower urinary tract infection)       MDM  The patient's previous potassium was an error.  Normal potassium and renal function here.  She was noted to have a uti at a previous visit by Dr Anna Genre.  I have started her on an abx (amoxicillin) for enterococcus uti.  Sensitivities are pending.        Celene Kras, MD 04/18/12 [phone]

## 2012-04-18 NOTE — Progress Notes (Signed)
  Subjective:    Patient ID: Shelly Martin, female    DOB: 08/29/1918, 78 y.o.   MRN: [id number]  HPI patient presents for a repeat potassium level. Apparently she was seen approximately 6 days ago he had a potassium of 3.9. When seen on the 21st a potassium level was done and was elevated at 7. She presents here for repeat potassium level.    Review of Systems     Objective:   Physical Exam patient is alert and cooperative with no distress        Assessment & Plan:  I told the patient it takes her 4 hour turnaround time to get a potassium level done here. Because of this I called [facility] and spoke to the triage nurse in the emergency room. They agreed to see the patient and do a stat potassium level. Both the patient and her daughter were agreeable to this plan. I let Dr.Shoenhoff know we were doing so she would be involved in her followup care

## 2012-04-18 NOTE — ED Notes (Signed)
Pt presenting to ed with c/o "my doctor told me to come here to have my potassium rechecked" pt denies any other problems at this time. Pt denies any chest pain, shortness of breath, nausea and vomiting at this time. Pt is alert and oriented

## 2012-04-19 ENCOUNTER — Telehealth: Payer: Self-pay | Admitting: Internal Medicine

## 2012-04-19 ENCOUNTER — Other Ambulatory Visit: Payer: Self-pay | Admitting: *Deleted

## 2012-04-19 ENCOUNTER — Other Ambulatory Visit: Payer: Self-pay | Admitting: Internal Medicine

## 2012-04-19 DIAGNOSIS — M48061 Spinal stenosis, lumbar region without neurogenic claudication: Secondary | ICD-10-CM

## 2012-04-19 LAB — URINE CULTURE: Colony Count: >=100000

## 2012-04-19 MED ORDER — LOSARTAN POTASSIUM 50 MG PO TABS: ORAL_TABLET | ORAL | Status: DC

## 2012-04-19 NOTE — Telephone Encounter (Signed)
Needs refill

## 2012-04-19 NOTE — Telephone Encounter (Signed)
Left message on pt cell phone to call office tomorrow  I spoke with Dr. Wynn Banker who will see pt for chronic pain control

## 2012-04-20 ENCOUNTER — Telehealth: Payer: Self-pay | Admitting: Internal Medicine

## 2012-04-20 LAB — CEA: CEA: 3.3 ng/mL (ref 0.0–5.0)

## 2012-04-20 NOTE — Telephone Encounter (Signed)
I left a message both Monday 11/25 and Tuesday 11/26 for pt to return call.  Message left at home phone number

## 2012-04-20 NOTE — Telephone Encounter (Signed)
Spoke with pts daughter [name]  Pt is in shower.    Pain may be controlled a little better.  Pt is thinking clearly.  Sleeps "alot:" but no more than usual.   Nausea improved and pt eating a little better  Counseled to get Colace to take daily and can use Miralax as needed for constipation  Counseled to keep follow up appt with me

## 2012-04-23 ENCOUNTER — Encounter (HOSPITAL_COMMUNITY): Payer: Self-pay

## 2012-04-23 ENCOUNTER — Emergency Department (HOSPITAL_COMMUNITY)
Admission: EM | Admit: 2012-04-23 | Discharge: 2012-04-23 | Disposition: A | Discharge status: Home / Self Care | Payer: Medicare Other | Attending: Emergency Medicine | Admitting: Emergency Medicine

## 2012-04-23 ENCOUNTER — Emergency Department (HOSPITAL_COMMUNITY): Payer: Medicare Other

## 2012-04-23 DIAGNOSIS — I10 Essential (primary) hypertension: Secondary | ICD-10-CM | POA: Insufficient documentation

## 2012-04-23 DIAGNOSIS — G2581 Restless legs syndrome: Secondary | ICD-10-CM | POA: Insufficient documentation

## 2012-04-23 DIAGNOSIS — Z87440 Personal history of urinary (tract) infections: Secondary | ICD-10-CM | POA: Insufficient documentation

## 2012-04-23 DIAGNOSIS — R141 Gas pain: Secondary | ICD-10-CM | POA: Insufficient documentation

## 2012-04-23 DIAGNOSIS — R142 Eructation: Secondary | ICD-10-CM | POA: Insufficient documentation

## 2012-04-23 DIAGNOSIS — K5900 Constipation, unspecified: Secondary | ICD-10-CM | POA: Insufficient documentation

## 2012-04-23 DIAGNOSIS — N390 Urinary tract infection, site not specified: Secondary | ICD-10-CM | POA: Insufficient documentation

## 2012-04-23 DIAGNOSIS — Z8673 Personal history of transient ischemic attack (TIA), and cerebral infarction without residual deficits: Secondary | ICD-10-CM | POA: Insufficient documentation

## 2012-04-23 DIAGNOSIS — R300 Dysuria: Secondary | ICD-10-CM | POA: Insufficient documentation

## 2012-04-23 DIAGNOSIS — Z87448 Personal history of other diseases of urinary system: Secondary | ICD-10-CM | POA: Insufficient documentation

## 2012-04-23 DIAGNOSIS — Z8679 Personal history of other diseases of the circulatory system: Secondary | ICD-10-CM | POA: Insufficient documentation

## 2012-04-23 DIAGNOSIS — Z79899 Other long term (current) drug therapy: Secondary | ICD-10-CM | POA: Insufficient documentation

## 2012-04-23 DIAGNOSIS — Z8719 Personal history of other diseases of the digestive system: Secondary | ICD-10-CM | POA: Insufficient documentation

## 2012-04-23 DIAGNOSIS — Z8739 Personal history of other diseases of the musculoskeletal system and connective tissue: Secondary | ICD-10-CM | POA: Insufficient documentation

## 2012-04-23 LAB — URINALYSIS, ROUTINE W REFLEX MICROSCOPIC
Nitrite: NEGATIVE
Protein, ur: NEGATIVE mg/dL
Specific Gravity, Urine: 1.007 (ref 1.005–1.030)
Urobilinogen, UA: 0.2 mg/dL (ref 0.0–1.0)

## 2012-04-23 LAB — CBC WITH DIFFERENTIAL/PLATELET
Basophils Absolute: 0 10*3/uL (ref 0.0–0.1)
Eosinophils Relative: 1 % (ref 0–5)
HCT: 32.1 % — ABNORMAL LOW (ref 36.0–46.0)
Hemoglobin: 10.4 g/dL — ABNORMAL LOW (ref 12.0–15.0)
Lymphocytes Relative: 20 % (ref 12–46)
Lymphs Abs: 1.4 10*3/uL (ref 0.7–4.0)
MCV: 89.7 fL (ref 78.0–100.0)
Monocytes Absolute: 0.4 10*3/uL (ref 0.1–1.0)
Monocytes Relative: 5 % (ref 3–12)
Neutro Abs: 5.3 10*3/uL (ref 1.7–7.7)
RDW: 12.9 % (ref 11.5–15.5)
WBC: 7.2 10*3/uL (ref 4.0–10.5)

## 2012-04-23 LAB — COMPREHENSIVE METABOLIC PANEL
AST: 17 U/L (ref 0–37)
BUN: 29 mg/dL — ABNORMAL HIGH (ref 6–23)
CO2: 30 mEq/L (ref 19–32)
Calcium: 9.2 mg/dL (ref 8.4–10.5)
Chloride: 101 mEq/L (ref 96–112)
Creatinine, Ser: 0.89 mg/dL (ref 0.50–1.10)
GFR calc Af Amer: 64 mL/min — ABNORMAL LOW (ref >90)
GFR calc non Af Amer: 55 mL/min — ABNORMAL LOW (ref >90)
Glucose, Bld: 116 mg/dL — ABNORMAL HIGH (ref 70–99)
Total Bilirubin: 0.5 mg/dL (ref 0.3–1.2)

## 2012-04-23 LAB — URINE MICROSCOPIC-ADD ON

## 2012-04-23 MED ORDER — SULFAMETHOXAZOLE-TRIMETHOPRIM 800-160 MG PO TABS: 1.0000 | ORAL_TABLET | Freq: Two times a day (BID) | ORAL | Status: DC

## 2012-04-23 NOTE — ED Notes (Signed)
Pt with hypertension and constipation, BP 205/80

## 2012-04-23 NOTE — ED Provider Notes (Signed)
History     CSN: [id number]  Arrival date & time 04/23/12  0716   First MD Initiated Contact with Patient 04/23/12 0720      Chief Complaint  Patient presents with  . Hypertension    (Consider location/radiation/quality/duration/timing/severity/associated sxs/prior treatment) HPI Comments: Patient states she has been having difficulty moving her bowels recently, straining to go to the bathroom.  This morning she was straining to have a bowel movement and her blood pressure was over 200.  She decided to come in to be checked for this.  She denies fevers or chills.  No abdominal pain.  She takes percocet for spinal stenosis.  No relief with colace and miralax.  She was recently here seen for uti, diarrhea, and low potassium.    Patient is a 78 y.o. female presenting with hypertension. The history is provided by the patient.  Hypertension This is a new problem. Episode onset: this morning. The problem occurs constantly. The problem has been gradually improving. Pertinent negatives include no chest pain. Associated symptoms comments: constipation. Nothing aggravates the symptoms. Nothing relieves the symptoms. Treatments tried: losartan. The treatment provided no relief.    Past Medical History  Diagnosis Date  . Stroke     Mini, no residual  . Hypertension   . Restless legs syndrome     on Requip  . Ulcer causing bleeding and hole in wall of stomach or small intestine   . Gastric ulcer     years ago  . Arthritis     Back, knees  . Constipation   . Urinary bladder calculus   . Urinary incontinence   . Bowel incontinence   . Heart murmur     Past Surgical History  Procedure Date  . Colon resection 50 years ago  . Tonsillectomy   . Uterine fibroid surgery   . Lumbar laminectomy/decompression microdiscectomy 06/25/2011    Procedure: LUMBAR LAMINECTOMY/DECOMPRESSION MICRODISCECTOMY;  Surgeon: Hewitt Shorts, MD;  Location: [facility];  Service: Neurosurgery;  Laterality:  Right;  RIGHT Lumbar Two-Three hemilaminectomy and microdiskectomy  . Vaginal hysterectomy     Family History  Problem Relation Age of Onset  . Anesthesia problems Neg Hx   . Pancreatic cancer Brother   . Dementia Mother   . Stroke Father     History  Substance Use Topics  . Smoking status: Never Smoker   . Smokeless tobacco: Never Used  . Alcohol Use: No     Comment: rarely    OB History    Grav Para Term Preterm Abortions TAB SAB Ect Mult Living                  Review of Systems  Cardiovascular: Negative for chest pain.  Gastrointestinal: Positive for constipation and abdominal distention.  Genitourinary: Positive for dysuria.  All other systems reviewed and are negative.    Allergies  Baclofen; Hctz; Oxycodone; and Valium  Home Medications   Current Outpatient Rx  Name  Route  Sig  Dispense  Refill  . AMOXICILLIN 500 MG PO CAPS   Oral   Take 1 capsule (500 mg total) by mouth 3 (three) times daily.   21 capsule   0   . BACLOFEN 10 MG PO TABS   Oral   Take 10 mg by mouth 2 (two) times daily.         Marland Kitchen DIPHENOXYLATE-ATROPINE 2.5-0.025 MG PO TABS   Oral   Take 2 tablets by mouth 4 (four) times daily as needed  for diarrhea or loose stools.   20 tablet   0   . GABAPENTIN 300 MG PO CAPS   Oral   Take 300 mg by mouth daily.         Marland Kitchen LOSARTAN POTASSIUM 50 MG PO TABS      Take one tablet twice a day   60 tablet   1   . OXYCODONE-ACETAMINOPHEN 7.5-500 MG PO TABS      Take one tablet every 6 hours as needed for pain   30 tablet   0   . PROMETHAZINE HCL 12.5 MG PO TABS   Oral   Take 1 tablet (12.5 mg total) by mouth every 8 (eight) hours as needed for nausea.   20 tablet   1   . ROPINIROLE HCL 2 MG PO TABS   Oral   Take 2 mg by mouth at bedtime.           BP 161/75  Pulse 71  Temp 97.9 F (36.6 C) (Oral)  Resp 16  SpO2 98%  Physical Exam  Nursing note and vitals reviewed. Constitutional: She is oriented to person, place, and  time. She appears well-developed and well-nourished. No distress.  HENT:  Head: Normocephalic and atraumatic.  Neck: Normal range of motion. Neck supple.  Cardiovascular: Normal rate and regular rhythm.  Exam reveals no gallop and no friction rub.   No murmur heard. Pulmonary/Chest: Effort normal and breath sounds normal. No respiratory distress. She has no wheezes.  Abdominal: Soft. Bowel sounds are normal. She exhibits no distension. There is no tenderness.  Musculoskeletal: Normal range of motion.  Neurological: She is alert and oriented to person, place, and time.  Skin: Skin is warm and dry. She is not diaphoretic.    ED Course  Procedures (including critical care time)   Labs Reviewed  CBC WITH DIFFERENTIAL  COMPREHENSIVE METABOLIC PANEL  URINALYSIS, ROUTINE W REFLEX MICROSCOPIC   No results found.   No diagnosis found.    MDM  The patient presents here with multiple complaints, the most significant to her appears to be constipation.  She had a large bowel movement while she was here and the xrays do not suggest an obstruction and her blood pressure is within an acceptable range.  The abdomen is benign and does not warrant further workup.  She will be treated for a uti and is to follow up with her pcp.          Geoffery Lyons, MD 04/23/12 [phone]

## 2012-04-23 NOTE — ED Notes (Signed)
Pt returned from Radiology.

## 2012-04-24 LAB — URINE CULTURE: Colony Count: 8000

## 2012-04-27 ENCOUNTER — Other Ambulatory Visit: Payer: Self-pay | Admitting: Internal Medicine

## 2012-04-27 NOTE — Telephone Encounter (Signed)
Called rx in  

## 2012-04-27 NOTE — Telephone Encounter (Signed)
Pt would like Oxycodone/Acetaminophn 7.5-500 mg.  Called into pharmacy [facility] phone number [phone].  Call back number for pt [phone].

## 2012-04-28 ENCOUNTER — Encounter: Payer: Self-pay | Admitting: Physical Medicine and Rehabilitation

## 2012-04-28 ENCOUNTER — Encounter
Payer: Medicare Other | Attending: Physical Medicine and Rehabilitation | Admitting: Physical Medicine and Rehabilitation

## 2012-04-28 VITALS — BP 96/54 | HR 58 | Resp 14 | Ht 60.0 in | Wt 123.0 lb

## 2012-04-28 DIAGNOSIS — M545 Low back pain, unspecified: Secondary | ICD-10-CM | POA: Insufficient documentation

## 2012-04-28 DIAGNOSIS — M25559 Pain in unspecified hip: Secondary | ICD-10-CM | POA: Insufficient documentation

## 2012-04-28 DIAGNOSIS — M549 Dorsalgia, unspecified: Secondary | ICD-10-CM

## 2012-04-28 DIAGNOSIS — M47814 Spondylosis without myelopathy or radiculopathy, thoracic region: Secondary | ICD-10-CM | POA: Insufficient documentation

## 2012-04-28 DIAGNOSIS — M48061 Spinal stenosis, lumbar region without neurogenic claudication: Secondary | ICD-10-CM

## 2012-04-28 DIAGNOSIS — M25551 Pain in right hip: Secondary | ICD-10-CM

## 2012-04-28 DIAGNOSIS — M47817 Spondylosis without myelopathy or radiculopathy, lumbosacral region: Secondary | ICD-10-CM | POA: Insufficient documentation

## 2012-04-28 DIAGNOSIS — R32 Unspecified urinary incontinence: Secondary | ICD-10-CM | POA: Insufficient documentation

## 2012-04-28 DIAGNOSIS — G8929 Other chronic pain: Secondary | ICD-10-CM

## 2012-04-28 MED ORDER — OXYCODONE-ACETAMINOPHEN 7.5-500 MG PO TABS: 1.0000 | ORAL_TABLET | Freq: Four times a day (QID) | ORAL | Status: DC | PRN

## 2012-04-28 NOTE — Progress Notes (Addendum)
Subjective:    Patient ID: Shelly Martin, female    DOB: Jun 08, 1918, 78 y.o.   MRN: [id number]  HPI  78 y/o woman with multilevel thoracolumbar spondylosis and stenosis, s/p L 3  laminectiomy and microdiscectomy per Dr. Kizzie Ide 06/25/11. She reports a long history of bladder problems. She reports approximately 6 month history of bowel problems.  She reports a history of back pain off and on for decades. She developed a severe back and right hip and leg pain in the fall of 2012. She has been following with Dr.Nudelman. She underwent epidural steroid injections which did not help significantly. She eventually underwent L3 laminectomy and microdiscectomy in January of 2013. Recent studies noted.  06.10.13 MRI Right Hip  "Severe right hip osteoarthritis is present with femoral  head and acetabular marginal osteophytes and small subchondral  cysts. Diffuse degenerative tearing of the acetabular labrum.  Mild degenerative right hip synovitis is present. There is  muscular edema in the proximal quadriceps muscles, predominately  involving the proximal vastus intermedius (image 24 series 5, image  12 series 4). This is asymmetric, only affecting the right. Mild  edema and atrophy is also present in the right gluteus minimus  muscle. There is also edema in the proximal right adductor  compartment (image 20 series 5)."   07.28.13 Thoracolumber MRI  Showed multilevel spondylosis and spinal stenosis  On her health and history form she indicates that her pain has been progressive over the last 5 years or so. The quality of her pain is variable. She has some difficulty verbalizing her pain.  She reports a rather constant pain which is located in the low back posterior lateral hip and posterior thigh. She has this discomfort while she is sitting. She reports pain at about 5 or 6 while sitting. Her pain increases significantly when she transitions from sit to stand. This pain is sharper and seems to  inhibit her from bearing weight through the right lower extremity at times. She can walk less than 10 minutes.   She typically sleeps on her side or on her abdomen. She denies numbness or weakness.  She reports a long history of bladder problems and 6 month history of problems with her bowels. She reports incontinence at times and severe constipation at times.  She was using hydrocodone up to 4 times a day as well as gabapentin. She was switched to percocet yesterday. She states she does not take the gabapentin as prescribed.     She also states she's had a 20 pound weight loss.          Pain Inventory Average Pain 10 Pain Right Now 10 My pain is sharp and aching  In the last 24 hours, has pain interfered with the following? General activity 10 Relation with others 10 Enjoyment of life 10 What TIME of day is your pain at its worst? morning Sleep (in general) Good  Pain is worse with: bending and sitting Pain improves with: heat/ice and medication Relief from Meds: 3  Mobility walk with assistance use a cane how many minutes can you walk? less than 10 minutes ability to climb steps?  yes do you drive?  no Do you have any goals in this area?  yes  Function retired  Neuro/Psych bladder control problems bowel control problems numbness spasms confusion depression  Prior Studies Any changes since last visit?  no  Physicians involved in your care Primary care [facility] Neurologist  Neurosurgeon    Family History  Problem Relation  Age of Onset  . Anesthesia problems Neg Hx   . Pancreatic cancer Brother   . Dementia Mother   . Stroke Father    History   Social History  . Marital Status: Widowed    Spouse Name: N/A    Number of Children: 2  . Years of Education: N/A   Occupational History  . Retired    Social History Main Topics  . Smoking status: Never Smoker   . Smokeless tobacco: Never Used  . Alcohol Use: No     Comment: rarely  . Drug  Use: No  . Sexually Active: Not Currently   Other Topics Concern  . None   Social History Narrative  . None   Past Surgical History  Procedure Date  . Colon resection 50 years ago  . Tonsillectomy   . Uterine fibroid surgery   . Lumbar laminectomy/decompression microdiscectomy 06/25/2011    Procedure: LUMBAR LAMINECTOMY/DECOMPRESSION MICRODISCECTOMY;  Surgeon: Hewitt Shorts, MD;  Location: [facility];  Service: Neurosurgery;  Laterality: Right;  RIGHT Lumbar Two-Three hemilaminectomy and microdiskectomy  . Vaginal hysterectomy    Past Medical History  Diagnosis Date  . Stroke     Mini, no residual  . Hypertension   . Restless legs syndrome     on Requip  . Ulcer causing bleeding and hole in wall of stomach or small intestine   . Gastric ulcer     years ago  . Arthritis     Back, knees  . Constipation   . Urinary bladder calculus   . Urinary incontinence   . Bowel incontinence   . Heart murmur    BP 96/54  Pulse 58  Resp 14  Ht 5' (1.524 m)  Wt 123 lb (55.792 kg)  BMI 24.02 kg/m2  SpO2 96%     Review of Systems  Constitutional: Positive for unexpected weight change.  Gastrointestinal: Positive for nausea, diarrhea and constipation.  Musculoskeletal: Positive for myalgias, back pain, arthralgias and gait problem.  Neurological: Positive for numbness.  Psychiatric/Behavioral: Positive for confusion and dysphoric mood.  All other systems reviewed and are negative.       Objective:   Physical Exam The patient is an elderly female in no apparent distress.  She is alert oriented x3 cooperative and pleasant. She follows commands without difficulty and answers questions appropriately  Cranial nerves are notable for diminished hearing.  Coordination is grossly intact  Reflexes are 1+ in the upper extremities and diminished in both lower extremities. There is no abnormal tone or clonus or tremors noted  Pinprick Sensation is diminished over the right  distal medial thigh as well as in the right foot.  Manual muscle testing reveals 5 over 5 strength in the left lower extremity. Right lower extremity hip flexion is diminished however she reports significant pain with hip flexion and maybe unable to give full effort secondary to pain. The rest of the right lower extremity is 5 over 5 including knee extensors knee flexors dorsiflexors plantar flexors and EHL.  Transitioning from sitting to standing increases her right low back and right posterior and lateral hip pain.  She has limited lumbar motion  Internal and external rotation at the hips does not provoke pain however right hip flexion significantly provoke pain  Some tenderness is noted over the trochanter's bilaterally as well as slightly down the iliotibial band.  Diffuse tenderness noted and lumbar paraspinal musculature as well as gluteal hip musculature.  Well healed midline lumbar scar noted  Assessment & Plan:  1. Low back and right posterior lateral hip pain. Patient has a history of multilevel thoracolumbar spondylosis and spinal stenosis as well as significant right hip degenerative changes. MRI of her right hip in June and also showed proximal right hip musculature edema/evidence of denervation.  Pain is most likely multifactorial in nature.  Would like her to take her gabapentin as prescribed. We have also discussed her medication schedule today.  She had been on hydrocodone and was switched to percocet 5/500 qid yesterday. She would prefer not to be taking oral pain medications if possible.  The last epidural steroid injections were not helpful however.  We have also discussed considering lumbar facet blocks and possible right hip injection.  2. Chronic bowel problem: Consider initiating bowel program. We'll discuss further with primary care physician. She tells me this problem is significantly impacting her social life.  3. Bladder incontinence: Unclear to  me whether urodynamic studies have been done prior, will also discuss with PCP.  She will f/u in one month.

## 2012-04-28 NOTE — Patient Instructions (Addendum)
I understand you have right lower back,right hip pain.  Some of your pain is constant but some is worse with changing from sitting to standing.  We will start with adjustments of your medications. You mentioned that you are not taking gabapentin regularly.  This may help you with your more chronic and constant leg/hip pain.  Consider starting your first dose early in the morning since this is your worst time of the day.  I will be in contact with your primary care doctor to discuss this as well as the problems you are having with your bowel and bladder.  I understand that you have had problems with these areas for many months now.  I would like to see you back in the next month.

## 2012-04-29 ENCOUNTER — Encounter: Payer: Self-pay | Admitting: Internal Medicine

## 2012-04-29 ENCOUNTER — Telehealth: Payer: Self-pay | Admitting: Internal Medicine

## 2012-04-29 ENCOUNTER — Ambulatory Visit (INDEPENDENT_AMBULATORY_CARE_PROVIDER_SITE_OTHER): Payer: Medicare Other | Admitting: Internal Medicine

## 2012-04-29 ENCOUNTER — Telehealth: Payer: Self-pay | Admitting: Physical Medicine and Rehabilitation

## 2012-04-29 VITALS — BP 110/60 | HR 78 | Temp 97.8°F | Resp 18

## 2012-04-29 DIAGNOSIS — M48061 Spinal stenosis, lumbar region without neurogenic claudication: Secondary | ICD-10-CM

## 2012-04-29 DIAGNOSIS — M549 Dorsalgia, unspecified: Secondary | ICD-10-CM

## 2012-04-29 DIAGNOSIS — M25559 Pain in unspecified hip: Secondary | ICD-10-CM

## 2012-04-29 DIAGNOSIS — I10 Essential (primary) hypertension: Secondary | ICD-10-CM

## 2012-04-29 DIAGNOSIS — M169 Osteoarthritis of hip, unspecified: Secondary | ICD-10-CM

## 2012-04-29 DIAGNOSIS — R32 Unspecified urinary incontinence: Secondary | ICD-10-CM

## 2012-04-29 DIAGNOSIS — G8929 Other chronic pain: Secondary | ICD-10-CM

## 2012-04-29 NOTE — Progress Notes (Signed)
Subjective:    Patient ID: Shelly Martin, female    DOB: [birth year], 78 y.o.   MRN: [id number]  HPI  [name] is here for follow up.  She is accompanied by Roselyn Reef from [facility].  I note that her daughter Vanessa Kick is not with her again today and was not at visit with PMR yesterday  She has seen Dr. Pamelia Hoit yesterday who is trying to help with pain control  [name] has been to ER since last visit.  Most recently for severe constipation and elevated BP.  She had BM while in ER and BP was slightly elevated.   She had some confusion last night at home.  EMS came but pt did not go to ER as symptoms resolved.    Allergies  Allergen Reactions  . Amoxicillin   . Baclofen Other (See Comments)    "fuzzy in the eyes" and dizzy  . Hctz (Hydrochlorothiazide)     nausea  . Valium (Diazepam) Other (See Comments)    Pt became unresponsive and O2 sats dropped.   Past Medical History  Diagnosis Date  . Stroke     Mini, no residual  . Hypertension   . Restless legs syndrome     on Requip  . Ulcer causing bleeding and hole in wall of stomach or small intestine   . Gastric ulcer     years ago  . Arthritis     Back, knees  . Constipation   . Urinary bladder calculus   . Urinary incontinence   . Bowel incontinence   . Heart murmur    Past Surgical History  Procedure Date  . Colon resection 50 years ago  . Tonsillectomy   . Uterine fibroid surgery   . Lumbar laminectomy/decompression microdiscectomy 06/25/2011    Procedure: LUMBAR LAMINECTOMY/DECOMPRESSION MICRODISCECTOMY;  Surgeon: Hewitt Shorts, MD;  Location: [facility];  Service: Neurosurgery;  Laterality: Right;  RIGHT Lumbar Two-Three hemilaminectomy and microdiskectomy  . Vaginal hysterectomy    History   Social History  . Marital Status: Widowed    Spouse Name: N/A    Number of Children: 2  . Years of Education: N/A   Occupational History  . Retired    Social History Main Topics  . Smoking status: Never Smoker   .  Smokeless tobacco: Never Used  . Alcohol Use: No     Comment: rarely  . Drug Use: No  . Sexually Active: Not Currently   Other Topics Concern  . Not on file   Social History Narrative  . No narrative on file   Family History  Problem Relation Age of Onset  . Anesthesia problems Neg Hx   . Pancreatic cancer Brother   . Dementia Mother   . Stroke Father    Patient Active Problem List  Diagnosis  . Constipation  . Urinary incontinence  . H/O: CVA (cardiovascular accident)  . Hypertension  . Restless legs syndrome  . Personal history of colonic polyps  . Lower back pain  . History of TIAs  . S/P total hysterectomy and bilateral salpingo-oophorectomy  . Hard of hearing  . History of anemia  . RLS (restless legs syndrome)  . Chronic back pain  . Spinal stenosis, lumbar  . DJD (degenerative joint disease) of hip   Current Outpatient Prescriptions on File Prior to Visit  Medication Sig Dispense Refill  . baclofen (LIORESAL) 10 MG tablet       . gabapentin (NEURONTIN) 300 MG capsule Take 300 mg by mouth  3 (three) times daily as needed. For nerve pain      . losartan (COZAAR) 50 MG tablet Take 50 mg by mouth 2 (two) times daily.      Marland Kitchen oxyCODONE-acetaminophen (PERCOCET) 7.5-500 MG per tablet Take 1 tablet by mouth every 6 (six) hours as needed. for pain  30 tablet  0  . promethazine (PHENERGAN) 12.5 MG tablet Take 12.5 mg by mouth every 8 (eight) hours as needed. For nausea/vomiting      . rOPINIRole (REQUIP) 2 MG tablet Take 2 mg by mouth at bedtime.      . sulfamethoxazole-trimethoprim (BACTRIM DS,SEPTRA DS) 800-160 MG per tablet Take 1 tablet by mouth 2 (two) times daily.  10 tablet  0  . HYDROcodone-acetaminophen (LORTAB) 7.5-500 MG per tablet             Review of Systems See HPI    Objective:   Physical Exam Physical Exam  Nursing note and vitals reviewed.  Constitutional: She is oriented to person, place, and time. She appears well-developed and  well-nourished.  HENT:  Head: Normocephalic and atraumatic.  Cardiovascular: Normal rate and regular rhythm. Exam reveals no gallop and no friction rub.  No murmur heard.  Pulmonary/Chest: Breath sounds normal. She has no wheezes. She has no rales.  Neurological: She is alert and oriented to person, place, and time.  Skin: Skin is warm and dry.  Psychiatric: She has a normal mood and affect. Her behavior is normal.         Assessment & Plan:  1)  Chronic pain multiifactorial etiology :   Advanced DJD hip,  Multilevel spinal stenosis  It is not at all clear that pt is taking medication correctly.  Roselyn Reef will check all medication bottles and home and fax to my office.   I  will write down all meds to be taken and make sure [facility] care team is aware.  Will also refer to [facility] home care team.   Will defer to PMR opinion regarding steroid injection for HIP  2)  Recent UTI  I counseled pt to complete her Amoxicillin  For recent UTI.  3)  Suspect neurogenic bowel/bladder.  I spoke with Dr. Pamelia Hoit who will set up referral to Dr. McDiarmid and I will discuss with [facility] team to start bowel program with Dulcolax suppository qod.  Pt is agreeable to this.  She is to see me next week .  I advised pt to please have her daugher Jan come in for visit as I have yet to see her daughter.  I left message on her son Larry's cell phone to call my office so I can update him on mother's condition

## 2012-04-29 NOTE — Telephone Encounter (Signed)
Per Dr Pamelia Hoit will refer patient to [facility] for bladder function assesment and to [facility] radiology for right hip injection.  Orders placed.  Please schedule and respond.

## 2012-04-29 NOTE — Telephone Encounter (Signed)
Spoke with daughter Jan and updated mother's condition to her.  Advised her to come with pt to see me next week.  I also counseled it is time to discuss how to care for pt. if daughter is unable to do so.  Daughter has never had conversation with pt about this topic.

## 2012-04-29 NOTE — Telephone Encounter (Signed)
I left message on cell phone x2 for pt son Peyton Najjar to call office regarding his mother

## 2012-04-29 NOTE — Telephone Encounter (Signed)
I called Shelly Martin today to let her know I have spoken with her PCP earlier today, Dr Constance Goltz.  Her last right hip mri showed severe osteoarthritis. A right hip injection is being set up with [place] radiology in an attempt to help her with her right hip pain.  She understands that her pain is also likely to be coming from her degenerative spine.  She has a history of bladder incontinence and will be referred to [facility] urology for evaluation.  I told her I would also like to see her sooner that one month from last visit and am arranging to see her next Friday Dec 13.  She said she understood and was in agreement with the above.

## 2012-05-03 ENCOUNTER — Other Ambulatory Visit: Payer: Self-pay | Admitting: Internal Medicine

## 2012-05-03 ENCOUNTER — Other Ambulatory Visit: Payer: Self-pay | Admitting: *Deleted

## 2012-05-03 ENCOUNTER — Telehealth: Payer: Self-pay | Admitting: Internal Medicine

## 2012-05-03 MED ORDER — HYDROCODONE-ACETAMINOPHEN 5-500 MG PO TABS: ORAL_TABLET | ORAL | Status: DC

## 2012-05-03 MED ORDER — GABAPENTIN 100 MG PO CAPS: 100.0000 mg | ORAL_CAPSULE | Freq: Three times a day (TID) | ORAL | Status: DC

## 2012-05-03 NOTE — Telephone Encounter (Signed)
I spoke with [facility] health on Friday and will have home assessment as soon as possible.  Medication and DME orders to be faxed on Monday morning

## 2012-05-03 NOTE — Telephone Encounter (Signed)
Notified pharmacy of new rx and to discontinue all other narcotic pain meds

## 2012-05-03 NOTE — Telephone Encounter (Signed)
Refill request

## 2012-05-04 MED ORDER — ROPINIROLE HCL 2 MG PO TABS: 2.0000 mg | ORAL_TABLET | Freq: Every day | ORAL | Status: DC

## 2012-05-05 ENCOUNTER — Telehealth: Payer: Self-pay | Admitting: *Deleted

## 2012-05-05 NOTE — Telephone Encounter (Signed)
Left message requesting pt return call received a concerning  fax from [facility] RN who reported pt awoke with confusion

## 2012-05-05 NOTE — Telephone Encounter (Signed)
Pt returned call states that she is still in pain. That she has PT tomorrow and an appt on Friday

## 2012-05-07 ENCOUNTER — Encounter: Payer: Medicare Other | Admitting: Physical Medicine and Rehabilitation

## 2012-05-07 ENCOUNTER — Telehealth: Payer: Self-pay | Admitting: *Deleted

## 2012-05-07 NOTE — Telephone Encounter (Signed)
Pt states that she is unable to hold her bowels she has had diarrhea for several days and that she has had to cancel her pain doctor nad her neurologist

## 2012-05-07 NOTE — Telephone Encounter (Signed)
Notified pt that she is able to take OTC imodium advised her that if she developed severe abd pain N/V or fever to go to ED will call pt this WE to check on progress

## 2012-05-14 ENCOUNTER — Other Ambulatory Visit: Payer: Self-pay | Admitting: Physical Medicine and Rehabilitation

## 2012-05-14 DIAGNOSIS — M25559 Pain in unspecified hip: Secondary | ICD-10-CM

## 2012-05-20 ENCOUNTER — Ambulatory Visit
Admission: RE | Admit: 2012-05-20 | Discharge: 2012-05-20 | Disposition: A | Discharge status: Home / Self Care | Payer: Medicare Other | Source: Ambulatory Visit | Attending: Physical Medicine and Rehabilitation | Admitting: Physical Medicine and Rehabilitation

## 2012-05-20 DIAGNOSIS — M25559 Pain in unspecified hip: Secondary | ICD-10-CM

## 2012-05-20 MED ORDER — METHYLPREDNISOLONE ACETATE 40 MG/ML INJ SUSP (RADIOLOG
120.0000 mg | Freq: Once | INTRAMUSCULAR | Status: AC
  Administered 2012-05-20: 120 mg via INTRA_ARTICULAR

## 2012-05-20 MED ORDER — IOHEXOL 180 MG/ML  SOLN
1.0000 mL | Freq: Once | INTRAMUSCULAR | Status: AC | PRN
  Administered 2012-05-20: 1 mL via INTRAVENOUS

## 2012-05-27 ENCOUNTER — Telehealth: Payer: Self-pay | Admitting: *Deleted

## 2012-05-27 NOTE — Telephone Encounter (Signed)
Pt states that she is in tremendous pain and medication is not working for her.

## 2012-05-27 NOTE — Telephone Encounter (Signed)
She is to call her pain MD for further management or seek urgent care

## 2012-05-28 ENCOUNTER — Telehealth: Payer: Self-pay | Admitting: *Deleted

## 2012-05-28 NOTE — Telephone Encounter (Signed)
Pt called and left message states that she  Was unable to get an earlier appt with pain clinic and will not go to ER. Requesting a return call from Dr Constance Goltz

## 2012-05-28 NOTE — Telephone Encounter (Signed)
Counseled pt to call pain management this am and to go to UC or ED if pain significantly worsens

## 2012-05-31 ENCOUNTER — Other Ambulatory Visit: Payer: Self-pay | Admitting: Internal Medicine

## 2012-05-31 MED ORDER — HYDROCODONE-ACETAMINOPHEN 7.5-750 MG PO TABS: ORAL_TABLET | ORAL | Status: DC

## 2012-05-31 NOTE — Telephone Encounter (Signed)
Spoke with pt .  She is out of vicodin and has upcoming appt with Dr. Pamelia Hoit on the 15th of this month.  Her daughter will be able to go with her.   Will call in some Vicodin short term until she meets with Dr. Pamelia Hoit.

## 2012-05-31 NOTE — Telephone Encounter (Signed)
Called in Vicodin to [facility]

## 2012-05-31 NOTE — Telephone Encounter (Signed)
Called pt on home phone pt unavailable.  Counseled to call office back

## 2012-06-02 ENCOUNTER — Telehealth: Payer: Self-pay | Admitting: *Deleted

## 2012-06-02 NOTE — Telephone Encounter (Signed)
Faxed pt records and referral sheet to [facility]

## 2012-06-02 NOTE — Telephone Encounter (Signed)
Called pt regarding home health needs

## 2012-06-09 ENCOUNTER — Telehealth: Payer: Self-pay

## 2012-06-09 ENCOUNTER — Encounter
Payer: Medicare Other | Attending: Physical Medicine and Rehabilitation | Admitting: Physical Medicine and Rehabilitation

## 2012-06-09 ENCOUNTER — Encounter: Payer: Self-pay | Admitting: Physical Medicine and Rehabilitation

## 2012-06-09 VITALS — BP 126/55 | HR 86 | Resp 14 | Ht <= 58 in | Wt 124.0 lb

## 2012-06-09 DIAGNOSIS — M25559 Pain in unspecified hip: Secondary | ICD-10-CM | POA: Insufficient documentation

## 2012-06-09 DIAGNOSIS — M47814 Spondylosis without myelopathy or radiculopathy, thoracic region: Secondary | ICD-10-CM | POA: Insufficient documentation

## 2012-06-09 DIAGNOSIS — M48061 Spinal stenosis, lumbar region without neurogenic claudication: Secondary | ICD-10-CM

## 2012-06-09 DIAGNOSIS — M549 Dorsalgia, unspecified: Secondary | ICD-10-CM

## 2012-06-09 DIAGNOSIS — G8929 Other chronic pain: Secondary | ICD-10-CM

## 2012-06-09 DIAGNOSIS — M169 Osteoarthritis of hip, unspecified: Secondary | ICD-10-CM

## 2012-06-09 DIAGNOSIS — R32 Unspecified urinary incontinence: Secondary | ICD-10-CM | POA: Insufficient documentation

## 2012-06-09 DIAGNOSIS — M79604 Pain in right leg: Secondary | ICD-10-CM | POA: Insufficient documentation

## 2012-06-09 DIAGNOSIS — M47817 Spondylosis without myelopathy or radiculopathy, lumbosacral region: Secondary | ICD-10-CM | POA: Insufficient documentation

## 2012-06-09 DIAGNOSIS — M545 Low back pain, unspecified: Secondary | ICD-10-CM | POA: Insufficient documentation

## 2012-06-09 MED ORDER — OXYCODONE-ACETAMINOPHEN 5-325 MG PO TABS: 1.0000 | ORAL_TABLET | Freq: Three times a day (TID) | ORAL | Status: DC

## 2012-06-09 MED ORDER — GABAPENTIN 100 MG PO CAPS: ORAL_CAPSULE | ORAL | Status: DC

## 2012-06-09 NOTE — Patient Instructions (Addendum)
I. understand back and legs have been causing severe pain.  I am discontinuing your Vicodin.  I am increasing your gabapentin to 2 tablets 3 times a day  I have switched which you to Percocet 5/325 3 times a day as well.  Please take your medications at 8:30, 1:30, and 7:30.  Please make sure you keep a pain log.  Please keep a notebook to document when you take your pain medications.  I will see you in 2 weeks.  At your next visit bring in your pain log, your medication sheet, you will need to bring in your pain pill bottles.  Do not forget to bring in your pain pill bottles  Your daughter has stated to me she will be checking on your daily and helping you lay out your pain medications so that you can take them properly.

## 2012-06-09 NOTE — Telephone Encounter (Signed)
Patient needed reminded of appointment time.

## 2012-06-09 NOTE — Telephone Encounter (Signed)
Spoke with patient daughter per Dr Pamelia Hoit, they will bring in old medication to be destroyed at next visit.

## 2012-06-09 NOTE — Progress Notes (Signed)
Subjective:    Patient ID: Shelly Martin, female    DOB: 1918/06/08, 79 y.o.   MRN: [id number]  HPI    79 y/o woman with multilevel thoracolumbar spondylosis and stenosis, s/p L 3 laminectiomy and microdiscectomy per Dr. Kizzie Ide 06/25/11. She reports a long history of bladder problems. She reports approximately 6 month history of bowel problems.  She reports a history of back pain off and on for decades. She developed a severe back and right hip and leg pain in the fall of 2012. She has been following with Dr.Nudelman. She underwent epidural steroid injections which did not help significantly. She has severe multilevel spinal stenosis. She eventually underwent L3 laminectomy and microdiscectomy in January of 2013. Recent studies noted. She has right hip OA radiographically. Severe multilevel lumbar spinal stenosis.  She denies numbness or weakness.  She reports a long history of bladder problems and 6 month history of problems with her bowels. She reports incontinence at times and severe constipation at times. She is currently followed by urology as well as GI.  The patient missed an appointment with the last month.  Patient reports right hip injection last month was of no benefit.  Today she reports pain as a 10 on a scale of 10 described as sharp and burning predominantly into the right lower extremity.  She indicates she can walk about 10 minutes she can climb stairs she no longer drives and she does use a cane  She continues to live independently.  Her daughter accompanies her to this visit and verbalizes concerns regarding her mother's pain level. She feels current medication is not managing her mother's pain adequately.  During my discussion with the patient there seems to be some discrepancy regarding the patient's pain and medication usage between the patient and her daughter.  From my discussion with both of them it is apparent to me that current medications do not seem to  be making Shelly Martin sleepy or sedated. They have reported to me she was able to engage in a lunch activity with friends recently but that there was concern regarding her pain control at that time.  Patient's daughter remains in the room during history per patient request.    with others 7 Enjoyment of life 10 What TIME of day is your pain at its worst? varies Sleep (in general) Fair  Pain is worse with: some activites Pain improves with: pacing activities Relief from Meds: 3  Mobility use a cane how many minutes can you walk? 10 ability to climb steps?  yes do you drive?  no Do you have any goals in this area?  yes  Function retired  Neuro/Psych bladder control problems bowel control problems numbness trouble walking spasms  Prior Studies Any changes since last visit?  no  Physicians involved in your care Any changes since last visit?  no   Family History  Problem Relation Age of Onset  . Anesthesia problems Neg Hx   . Pancreatic cancer Brother   . Dementia Mother   . Stroke Father    History   Social History  . Marital Status: Widowed    Spouse Name: N/A    Number of Children: 2  . Years of Education: N/A   Occupational History  . Retired    Social History Main Topics  . Smoking status: Never Smoker   . Smokeless tobacco: Never Used  . Alcohol Use: No     Comment: rarely  . Drug Use: No  .  Sexually Active: Not Currently   Other Topics Concern  . None   Social History Narrative  . None   Past Surgical History  Procedure Date  . Colon resection 50 years ago  . Tonsillectomy   . Uterine fibroid surgery   . Lumbar laminectomy/decompression microdiscectomy 06/25/2011    Procedure: LUMBAR LAMINECTOMY/DECOMPRESSION MICRODISCECTOMY;  Surgeon: Hewitt Shorts, MD;  Location: [facility];  Service: Neurosurgery;  Laterality: Right;  RIGHT Lumbar Two-Three hemilaminectomy and microdiskectomy  . Vaginal hysterectomy    Past Medical  History  Diagnosis Date  . Stroke     Mini, no residual  . Hypertension   . Restless legs syndrome     on Requip  . Ulcer causing bleeding and hole in wall of stomach or small intestine   . Gastric ulcer     years ago  . Arthritis     Back, knees  . Constipation   . Urinary bladder calculus   . Urinary incontinence   . Bowel incontinence   . Heart murmur    BP 126/55  Pulse 86  Resp 14  Ht 4\' 10"  (1.473 m)  Wt 124 lb (56.246 kg)  BMI 25.92 kg/m2  SpO2 96%     Review of Systems  Constitutional: Positive for unexpected weight change.  Genitourinary: Positive for dysuria.  Musculoskeletal: Positive for back pain and gait problem.  Neurological: Positive for numbness.  All other systems reviewed and are negative.       Objective:   Physical Exam  The patient is an elderly female in no apparent distress.  She is alert oriented x3 cooperative and pleasant. She follows commands without difficulty and answers questions (some discrepancy in answer between patient and her daughter at times) Cranial nerves are notable for diminished hearing.  Coordination is grossly intact  Reflexes are 1+ in the upper extremities and diminished in both lower extremities. There is no abnormal tone or clonus or tremors noted  Pinprick Sensation is diminished over the right distal medial thigh as well as in the right foot.  Manual muscle testing reveals 5 over 5 strength in the left lower extremity. Right lower extremity hip flexion is diminished however she reports significant pain with hip flexion and maybe unable to give full effort secondary to pain. The rest of the right lower extremity is 5 over 5 including knee extensors knee flexors dorsiflexors plantar flexors and EHL.  Transitioning from sitting to standing increases her right low back and right posterior and lateral hip pain.  She has limited lumbar motion  Internal and external rotation at the hips does not provoke pain however right  hip flexion significantly provoke pain  Some tenderness is noted over the trochanter's bilaterally as well as slightly down the iliotibial band.  Diffuse tenderness noted and lumbar paraspinal musculature as well as gluteal hip musculature.  Well healed midline lumbar scar noted   Gait is notable for shorter stride length, decreased toe off. Romberg test is performed adequately.  Some mild difficulty noted with tandem gait.       Assessment & Plan:  1.  Severe right lower extremity pain secondary to severe lumbar spinal stenosis.  2. Right hip OA:  Right hip injection did not accurately improve pain.   Plan:  The patient and her daughter are both adamant that pain scores are interfering with function. The patient is currently taking hydrocodone 7.5/325 3 times a day and gabapentin 100 mg 3 times a day.  They reports there  is no problems with over sedation on these medications but inadequate pain control.  I will increase her gabapentin to 200 mg 3 times a day.  I will switch her hydrocodone to Percocet 5/325 3 times a day.  The patient will keep a pain journal on a daily basis.  The patient will document in writing when she takes her pain medication. I have given her scheduled to follow regarding dosage times.  Her daughter states she will be involved with managing her mother's pain medication stopping on a daily basis to help put out the right amount and  for her that day.  The patient understands that she will need to followup in my clinic for proper monitoring of these medications.  I have reviewed multiple times the risks and benefits of these medicines. They both understand there is increased risk of falls as well as confusion with the use of these medications.  Patient understands that she needs to bring in her pill bottles to each visit.  She understands that she is not to take any other narcotics from other providers. The patient's daughter will bring in old pain   medication to her next visit.  At the next visit which will be in 2 weeks I will be monitoring pill counts as well as checking a urine drug screen.

## 2012-06-11 ENCOUNTER — Telehealth: Payer: Self-pay | Admitting: *Deleted

## 2012-06-14 ENCOUNTER — Other Ambulatory Visit: Payer: Self-pay | Admitting: *Deleted

## 2012-06-14 ENCOUNTER — Telehealth: Payer: Self-pay | Admitting: *Deleted

## 2012-06-14 NOTE — Telephone Encounter (Signed)
Pt notified about upcoming home health visit

## 2012-06-14 NOTE — Telephone Encounter (Signed)
Home health RN notified office of home visit on Friday and Sat

## 2012-06-23 ENCOUNTER — Encounter: Payer: Medicare Other | Admitting: Physical Medicine and Rehabilitation

## 2012-06-24 ENCOUNTER — Emergency Department (HOSPITAL_COMMUNITY): Payer: Medicare Other

## 2012-06-24 ENCOUNTER — Observation Stay (HOSPITAL_COMMUNITY)
Admission: EM | Admit: 2012-06-24 | Discharge: 2012-06-26 | Disposition: A | Discharge status: Home / Self Care | Payer: Medicare Other | Attending: Internal Medicine | Admitting: Internal Medicine

## 2012-06-24 ENCOUNTER — Encounter (HOSPITAL_COMMUNITY): Payer: Self-pay | Admitting: *Deleted

## 2012-06-24 ENCOUNTER — Telehealth: Payer: Self-pay | Admitting: Internal Medicine

## 2012-06-24 DIAGNOSIS — M549 Dorsalgia, unspecified: Secondary | ICD-10-CM

## 2012-06-24 DIAGNOSIS — M545 Low back pain, unspecified: Secondary | ICD-10-CM | POA: Diagnosis present

## 2012-06-24 DIAGNOSIS — Z862 Personal history of diseases of the blood and blood-forming organs and certain disorders involving the immune mechanism: Secondary | ICD-10-CM

## 2012-06-24 DIAGNOSIS — I10 Essential (primary) hypertension: Secondary | ICD-10-CM | POA: Diagnosis present

## 2012-06-24 DIAGNOSIS — G8929 Other chronic pain: Secondary | ICD-10-CM | POA: Insufficient documentation

## 2012-06-24 DIAGNOSIS — H9190 Unspecified hearing loss, unspecified ear: Secondary | ICD-10-CM

## 2012-06-24 DIAGNOSIS — M169 Osteoarthritis of hip, unspecified: Secondary | ICD-10-CM

## 2012-06-24 DIAGNOSIS — R32 Unspecified urinary incontinence: Secondary | ICD-10-CM | POA: Diagnosis present

## 2012-06-24 DIAGNOSIS — R52 Pain, unspecified: Secondary | ICD-10-CM

## 2012-06-24 DIAGNOSIS — R42 Dizziness and giddiness: Principal | ICD-10-CM | POA: Diagnosis present

## 2012-06-24 DIAGNOSIS — M48061 Spinal stenosis, lumbar region without neurogenic claudication: Secondary | ICD-10-CM | POA: Diagnosis present

## 2012-06-24 DIAGNOSIS — G2581 Restless legs syndrome: Secondary | ICD-10-CM | POA: Diagnosis present

## 2012-06-24 DIAGNOSIS — Z90710 Acquired absence of both cervix and uterus: Secondary | ICD-10-CM

## 2012-06-24 DIAGNOSIS — Z8673 Personal history of transient ischemic attack (TIA), and cerebral infarction without residual deficits: Secondary | ICD-10-CM | POA: Insufficient documentation

## 2012-06-24 DIAGNOSIS — Z86010 Personal history of colonic polyps: Secondary | ICD-10-CM

## 2012-06-24 DIAGNOSIS — G459 Transient cerebral ischemic attack, unspecified: Secondary | ICD-10-CM

## 2012-06-24 LAB — COMPREHENSIVE METABOLIC PANEL
BUN: 21 mg/dL (ref 6–23)
CO2: 31 mEq/L (ref 19–32)
Calcium: 9 mg/dL (ref 8.4–10.5)
Chloride: 99 mEq/L (ref 96–112)
Creatinine, Ser: 0.74 mg/dL (ref 0.50–1.10)
GFR calc Af Amer: 83 mL/min — ABNORMAL LOW (ref >90)
GFR calc non Af Amer: 72 mL/min — ABNORMAL LOW (ref >90)
Glucose, Bld: 109 mg/dL — ABNORMAL HIGH (ref 70–99)
Total Bilirubin: 0.4 mg/dL (ref 0.3–1.2)

## 2012-06-24 LAB — CBC WITH DIFFERENTIAL/PLATELET
Basophils Absolute: 0 10*3/uL (ref 0.0–0.1)
Eosinophils Relative: 0 % (ref 0–5)
HCT: 31.4 % — ABNORMAL LOW (ref 36.0–46.0)
Hemoglobin: 10.3 g/dL — ABNORMAL LOW (ref 12.0–15.0)
Lymphocytes Relative: 17 % (ref 12–46)
Lymphs Abs: 1.3 10*3/uL (ref 0.7–4.0)
MCV: 91 fL (ref 78.0–100.0)
Monocytes Absolute: 0.6 10*3/uL (ref 0.1–1.0)
Monocytes Relative: 7 % (ref 3–12)
Neutro Abs: 6 10*3/uL (ref 1.7–7.7)
RDW: 13.7 % (ref 11.5–15.5)
WBC: 7.9 10*3/uL (ref 4.0–10.5)

## 2012-06-24 LAB — URINALYSIS, ROUTINE W REFLEX MICROSCOPIC
Glucose, UA: NEGATIVE mg/dL
Ketones, ur: NEGATIVE mg/dL
Leukocytes, UA: NEGATIVE
Protein, ur: NEGATIVE mg/dL
Urobilinogen, UA: 0.2 mg/dL (ref 0.0–1.0)

## 2012-06-24 LAB — CBC
MCH: 30 pg (ref 26.0–34.0)
MCHC: 33 g/dL (ref 30.0–36.0)
MCV: 90.8 fL (ref 78.0–100.0)
Platelets: 247 10*3/uL (ref 150–400)
RDW: 13.7 % (ref 11.5–15.5)
WBC: 8.6 10*3/uL (ref 4.0–10.5)

## 2012-06-24 LAB — TROPONIN I: Troponin I: <0.3 ng/mL (ref <0.30)

## 2012-06-24 LAB — CREATININE, SERUM: GFR calc Af Amer: 81 mL/min — ABNORMAL LOW (ref >90)

## 2012-06-24 MED ORDER — ASPIRIN EC 81 MG PO TBEC
81.0000 mg | DELAYED_RELEASE_TABLET | Freq: Every day | ORAL | Status: DC
  Administered 2012-06-24 – 2012-06-26 (×3): 81 mg via ORAL
  Filled 2012-06-24 (×3): qty 1

## 2012-06-24 MED ORDER — HYDROMORPHONE HCL PF 1 MG/ML IJ SOLN
1.0000 mg | Freq: Once | INTRAMUSCULAR | Status: AC
  Administered 2012-06-24: 1 mg via INTRAVENOUS
  Filled 2012-06-24: qty 1

## 2012-06-24 MED ORDER — HYDROCODONE-ACETAMINOPHEN 5-325 MG PO TABS
1.0000 | ORAL_TABLET | ORAL | Status: DC | PRN
  Administered 2012-06-24 – 2012-06-26 (×6): 2 via ORAL
  Filled 2012-06-24 (×6): qty 2

## 2012-06-24 MED ORDER — ENOXAPARIN SODIUM 40 MG/0.4ML ~~LOC~~ SOLN
40.0000 mg | SUBCUTANEOUS | Status: DC
  Administered 2012-06-25 – 2012-06-26 (×2): 40 mg via SUBCUTANEOUS
  Filled 2012-06-24 (×3): qty 0.4

## 2012-06-24 MED ORDER — ALBUTEROL SULFATE (5 MG/ML) 0.5% IN NEBU: 2.5000 mg | INHALATION_SOLUTION | RESPIRATORY_TRACT | Status: DC | PRN

## 2012-06-24 MED ORDER — SODIUM CHLORIDE 0.9 % IJ SOLN
3.0000 mL | Freq: Two times a day (BID) | INTRAMUSCULAR | Status: DC
  Administered 2012-06-25 – 2012-06-26 (×2): 3 mL via INTRAVENOUS

## 2012-06-24 MED ORDER — ALUM & MAG HYDROXIDE-SIMETH 200-200-20 MG/5ML PO SUSP: 30.0000 mL | Freq: Four times a day (QID) | ORAL | Status: DC | PRN

## 2012-06-24 MED ORDER — POLYETHYLENE GLYCOL 3350 17 G PO PACK
17.0000 g | PACK | Freq: Two times a day (BID) | ORAL | Status: DC
  Administered 2012-06-25 – 2012-06-26 (×2): 17 g via ORAL
  Filled 2012-06-24 (×5): qty 1

## 2012-06-24 MED ORDER — GABAPENTIN 100 MG PO CAPS
200.0000 mg | ORAL_CAPSULE | Freq: Three times a day (TID) | ORAL | Status: DC
  Administered 2012-06-24 – 2012-06-26 (×5): 200 mg via ORAL
  Filled 2012-06-24 (×8): qty 2

## 2012-06-24 MED ORDER — ONDANSETRON HCL 4 MG/2ML IJ SOLN: 4.0000 mg | Freq: Three times a day (TID) | INTRAMUSCULAR | Status: DC | PRN

## 2012-06-24 MED ORDER — LOSARTAN POTASSIUM 50 MG PO TABS
50.0000 mg | ORAL_TABLET | Freq: Two times a day (BID) | ORAL | Status: DC
  Administered 2012-06-24 – 2012-06-26 (×4): 50 mg via ORAL
  Filled 2012-06-24 (×5): qty 1

## 2012-06-24 MED ORDER — HYDROMORPHONE HCL PF 1 MG/ML IJ SOLN
1.0000 mg | INTRAMUSCULAR | Status: DC | PRN
  Administered 2012-06-24: 1 mg via INTRAVENOUS
  Filled 2012-06-24: qty 1

## 2012-06-24 MED ORDER — GADOBENATE DIMEGLUMINE 529 MG/ML IV SOLN
10.0000 mL | Freq: Once | INTRAVENOUS | Status: AC
  Administered 2012-06-24: 10 mL via INTRAVENOUS

## 2012-06-24 MED ORDER — ACETAMINOPHEN 650 MG RE SUPP: 650.0000 mg | Freq: Four times a day (QID) | RECTAL | Status: DC | PRN

## 2012-06-24 MED ORDER — SODIUM CHLORIDE 0.9 % IV SOLN
INTRAVENOUS | Status: AC
  Administered 2012-06-24: 1000 mL via INTRAVENOUS

## 2012-06-24 MED ORDER — ROPINIROLE HCL 1 MG PO TABS: 2.0000 mg | ORAL_TABLET | Freq: Two times a day (BID) | ORAL | Status: DC

## 2012-06-24 MED ORDER — ROPINIROLE HCL 1 MG PO TABS
2.0000 mg | ORAL_TABLET | Freq: Two times a day (BID) | ORAL | Status: DC
  Administered 2012-06-24 – 2012-06-26 (×4): 2 mg via ORAL
  Filled 2012-06-24 (×6): qty 2

## 2012-06-24 MED ORDER — MECLIZINE HCL 25 MG PO TABS
25.0000 mg | ORAL_TABLET | Freq: Three times a day (TID) | ORAL | Status: DC
  Administered 2012-06-24 – 2012-06-26 (×6): 25 mg via ORAL
  Filled 2012-06-24 (×7): qty 1

## 2012-06-24 MED ORDER — ONDANSETRON HCL 4 MG/2ML IJ SOLN: 4.0000 mg | Freq: Four times a day (QID) | INTRAMUSCULAR | Status: DC | PRN

## 2012-06-24 MED ORDER — ACETAMINOPHEN 325 MG PO TABS: 650.0000 mg | ORAL_TABLET | Freq: Four times a day (QID) | ORAL | Status: DC | PRN

## 2012-06-24 MED ORDER — ONDANSETRON HCL 4 MG PO TABS: 4.0000 mg | ORAL_TABLET | Freq: Four times a day (QID) | ORAL | Status: DC | PRN

## 2012-06-24 NOTE — ED Provider Notes (Signed)
History  This chart was scribed for Glynn Octave, MD by Shari Heritage, ED Scribe. The patient was seen in room B18C/B18C. Patient's care was started at 1500.  CSN: [id number]  Arrival date & time 06/24/12  1414   First MD Initiated Contact with Patient 06/24/12 1500      Chief Complaint  Patient presents with  . Dizziness     The history is provided by the patient. No language interpreter was used.    HPI Comments: Shelly Martin is a 79 y.o. female who presents to the Emergency Department complaining of persistent lightheadedness and dizziness onset this morning. Patient says that she does not feel like the room is spinning, but she is having trouble maintaining balance while walking and has had to hold onto the wall. Patient normally walks with a cane. There is associated nausea and 1 episode emesis. Patient denies chest pain or shortness of breath.   Patient is also complaining of suddenly worsening, moderate to severe, constant right hip and groin pain onset yesterday. Patient has a history of spinal stenosis and says she frequently has recurrent pain, but yesterday it became significantly worse. Patient also complains of lower back pain, but this is a chronic problem. Patient denies double vision, but states some blurred vision. Patient had lumbar laminectomy surgery on 06/25/2011. Patient's other medical history includes mini stroke, hypertension, heart murmur, arthritis and bladder incontinence.    Past Medical History  Diagnosis Date  . Stroke     Mini, no residual  . Hypertension   . Restless legs syndrome     on Requip  . Ulcer causing bleeding and hole in wall of stomach or small intestine   . Gastric ulcer     years ago  . Arthritis     Back, knees  . Constipation   . Urinary bladder calculus   . Urinary incontinence   . Bowel incontinence   . Heart murmur     Past Surgical History  Procedure Date  . Colon resection 50 years ago  . Tonsillectomy   .  Uterine fibroid surgery   . Lumbar laminectomy/decompression microdiscectomy 06/25/2011    Procedure: LUMBAR LAMINECTOMY/DECOMPRESSION MICRODISCECTOMY;  Surgeon: Hewitt Shorts, MD;  Location: [facility];  Service: Neurosurgery;  Laterality: Right;  RIGHT Lumbar Two-Three hemilaminectomy and microdiskectomy  . Vaginal hysterectomy     Family History  Problem Relation Age of Onset  . Anesthesia problems Neg Hx   . Pancreatic cancer Brother   . Dementia Mother   . Stroke Father     History  Substance Use Topics  . Smoking status: Never Smoker   . Smokeless tobacco: Never Used  . Alcohol Use: No     Comment: rarely    OB History    Grav Para Term Preterm Abortions TAB SAB Ect Mult Living                  Review of Systems A complete 10 system review of systems was obtained and all systems are negative except as noted in the HPI and PMH.   Allergies  Amoxicillin; Baclofen; Hctz; and Valium  Home Medications   Current Outpatient Rx  Name  Route  Sig  Dispense  Refill  . GABAPENTIN 100 MG PO CAPS   Oral   Take 200 mg by mouth 3 (three) times daily.         Marland Kitchen LOSARTAN POTASSIUM 50 MG PO TABS   Oral   Take 50 mg by  mouth 2 (two) times daily.         . OXYCODONE-ACETAMINOPHEN 5-325 MG PO TABS   Oral   Take 1 tablet by mouth 3 (three) times daily.   50 tablet   0   . ROPINIROLE HCL 2 MG PO TABS   Oral   Take 2 mg by mouth 2 (two) times daily.           Triage Vitals: BP 179/67  Pulse 69  Temp 98.3 F (36.8 C) (Oral)  Resp 12  SpO2 99%  Physical Exam  Constitutional: She is oriented to person, place, and time. She appears well-developed and well-nourished.  HENT:  Head: Normocephalic and atraumatic.  Mouth/Throat: Oropharynx is clear and moist.  Eyes: Conjunctivae normal and EOM are normal. Pupils are equal, round, and reactive to light. Right eye exhibits no nystagmus. Left eye exhibits no nystagmus.  Cardiovascular: Normal rate, regular rhythm  and normal heart sounds.   No murmur heard. Pulmonary/Chest: Effort normal and breath sounds normal. No respiratory distress. She has no wheezes. She has no rales.  Abdominal: Soft. Bowel sounds are normal. She exhibits no distension. There is no tenderness.  Musculoskeletal: She exhibits no edema.       +2 DP/PT pulses.  Neurological: She is alert and oriented to person, place, and time.       No ataxia on finger to nose bilaterally. 5/5 strength throughout. Test of skew negative. Negative head impact test. Unable to test gait, or perform romberg.  Skin: Skin is warm and dry.  Psychiatric: She has a normal mood and affect. Her behavior is normal.    ED Course  Procedures (including critical care time) DIAGNOSTIC STUDIES: Oxygen Saturation is 99% on room air, normal by my interpretation.    COORDINATION OF CARE: 3:18 PM- Patient informed of current plan for treatment and evaluation and agrees with plan at this time.    6:37 PM- Spoke with Dr. Jacqualine Code regarding patient's case. Will be admitted to internal medicine for further obs.  Results for orders placed during the hospital encounter of 06/24/12  CBC WITH DIFFERENTIAL      Component Value Range   WBC 7.9  4.0 - 10.5 K/uL   RBC 3.45 (*) 3.87 - 5.11 MIL/uL   Hemoglobin 10.3 (*) 12.0 - 15.0 g/dL   HCT 16.1 (*) 09.6 - 04.5 %   MCV 91.0  78.0 - 100.0 fL   MCH 29.9  26.0 - 34.0 pg   MCHC 32.8  30.0 - 36.0 g/dL   RDW 40.9  81.1 - 91.4 %   Platelets 247  150 - 400 K/uL   Neutrophils Relative 76  43 - 77 %   Neutro Abs 6.0  1.7 - 7.7 K/uL   Lymphocytes Relative 17  12 - 46 %   Lymphs Abs 1.3  0.7 - 4.0 K/uL   Monocytes Relative 7  3 - 12 %   Monocytes Absolute 0.6  0.1 - 1.0 K/uL   Eosinophils Relative 0  0 - 5 %   Eosinophils Absolute 0.0  0.0 - 0.7 K/uL   Basophils Relative 0  0 - 1 %   Basophils Absolute 0.0  0.0 - 0.1 K/uL  COMPREHENSIVE METABOLIC PANEL      Component Value Range   Sodium 137  135 - 145 mEq/L    Potassium 4.2  3.5 - 5.1 mEq/L   Chloride 99  96 - 112 mEq/L   CO2 31  19 - 32 mEq/L  Glucose, Bld 109 (*) 70 - 99 mg/dL   BUN 21  6 - 23 mg/dL   Creatinine, Ser 1.61  0.50 - 1.10 mg/dL   Calcium 9.0  8.4 - 09.6 mg/dL   Total Protein 6.3  6.0 - 8.3 g/dL   Albumin 3.3 (*) 3.5 - 5.2 g/dL   AST 24  0 - 37 U/L   ALT 25  0 - 35 U/L   Alkaline Phosphatase 110  39 - 117 U/L   Total Bilirubin 0.4  0.3 - 1.2 mg/dL   GFR calc non Af Amer 72 (*) >90 mL/min   GFR calc Af Amer 83 (*) >90 mL/min  TROPONIN I      Component Value Range   Troponin I <0.30  <0.30 ng/mL  URINALYSIS, ROUTINE W REFLEX MICROSCOPIC      Component Value Range   Color, Urine YELLOW  YELLOW   APPearance CLEAR  CLEAR   Specific Gravity, Urine 1.015  1.005 - 1.030   pH 6.0  5.0 - 8.0   Glucose, UA NEGATIVE  NEGATIVE mg/dL   Hgb urine dipstick NEGATIVE  NEGATIVE   Bilirubin Urine NEGATIVE  NEGATIVE   Ketones, ur NEGATIVE  NEGATIVE mg/dL   Protein, ur NEGATIVE  NEGATIVE mg/dL   Urobilinogen, UA 0.2  0.0 - 1.0 mg/dL   Nitrite NEGATIVE  NEGATIVE   Leukocytes, UA NEGATIVE  NEGATIVE    Mr Shirlee Latch Wo Contrast  06/24/2012  *RADIOLOGY REPORT*  Clinical Data:  Vertigo.  MRI BRAIN WITH AND WITHOUT CONTRAST MRA HEAD WITHOUT CONTRAST MRA NECK WITHOUT AND WITH CONTRAST  Technique: Multiplanar, multiecho pulse sequences of the brain and surrounding structures were obtained according to standard protocol with and without intravenous contrast.  Angiographic images of the Circle of Willis were obtained using MRA technique without intravenous contrast.  Angiographic images of the neck were obtained using MRA technique without and with intravenous contrast.  Contrast:10 ml MultiHance.  Comparison:12/20/2011 CT.  11/20/2010 MR.  11/13/2009 MR and MR angiogram circle of Willis.  MRI HEAD  Findings: No acute infarct.  No intracranial hemorrhage.  Very mild small vessel disease type changes.  Mild global atrophy without hydrocephalus.  Left  parafalcine 1.1 cm meningioma without surrounding vasogenic edema unchanged.  No other intracranial mass or abnormal enhancement.  Prominent cervical spondylotic changes with spinal stenosis and cord flattening C3-4, C4-5 C5-6.  Transverse ligament hypertrophy.  Partially empty sella.  Mild mucosal thickening/partial opacification ethmoid sinus air cells.  IMPRESSION: No acute infarct.  Left parafalcine 1.1 cm meningioma without surrounding vasogenic edema unchanged.  Prominent cervical spondylotic changes with spinal stenosis and cord flattening C3-4, C4-5 and C5-6.  Transverse ligament hypertrophy.  Mild mucosal thickening/partial opacification ethmoid sinus air cells.  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  Mild irregularity narrowing A1 segment anterior cerebral artery bilaterally.  Minimal irregularity M1 segment middle cerebral artery bilaterally.  Marked tandem stenosis A2 segment right anterior cerebral artery.  Moderate narrowing of the middle cerebral artery branch vessels bilaterally.  Left vertebral artery is dominant.  Mild to moderate narrowing involving portions of the visualized distal vertebral arteries bilaterally more notable on the right.  Nonvisualization left PICA.  Ectatic basilar artery with moderate narrowing distally.  Moderate superior cerebellar artery irregularity bilaterally.  Moderate to marked narrowing proximal aspect posterior cerebral artery greater on the right.  Moderate to marked tandem stenosis distal branches of the posterior cerebral artery bilaterally.  No aneurysm or vascular malformation.  IMPRESSION: Prominent intracranial atherosclerotic type changes as detailed above.  Cough  MRA NECK  Findings:Left vertebral artery arises directly from the aortic arch with proximal mild to slightly moderate narrowing.  Ectatic proximal right vertebral artery with mild narrowing.  Left vertebral artery is dominant.  Slight irregularity cervical  segment of the vertebral arteries bilaterally.  Ectatic right common carotid artery.  Mild smooth narrowing proximal right internal carotid artery.  2 cm beyond its origin there is a fold and narrowing secondary to ectasia.  Ectatic left common carotid artery.  Narrowing proximal left internal carotid artery with 50% diameter stenosis.  Left internal carotid artery is ectatic beyond this region without high-grade narrowing.  IMPRESSION: Ectatic common carotid arteries and proximal internal carotid arteries without hemodynamically significant stenosis of either carotid bifurcation as noted above.  Left vertebral artery arises directly from the aortic arch with mild to slightly moderate narrowing.  Ectatic proximal right vertebral artery with mild narrowing.  Mild irregularity of the mid vertical cervical segment of the vertebral arteries bilaterally.   Original Report Authenticated By: Lacy Duverney, M.D.    Mr Angiogram Neck W Wo Contrast  06/24/2012  *RADIOLOGY REPORT*  Clinical Data:  Vertigo.  MRI BRAIN WITH AND WITHOUT CONTRAST MRA HEAD WITHOUT CONTRAST MRA NECK WITHOUT AND WITH CONTRAST  Technique: Multiplanar, multiecho pulse sequences of the brain and surrounding structures were obtained according to standard protocol with and without intravenous contrast.  Angiographic images of the Circle of Willis were obtained using MRA technique without intravenous contrast.  Angiographic images of the neck were obtained using MRA technique without and with intravenous contrast.  Contrast:10 ml MultiHance.  Comparison:12/20/2011 CT.  11/20/2010 MR.  11/13/2009 MR and MR angiogram circle of Willis.  MRI HEAD  Findings: No acute infarct.  No intracranial hemorrhage.  Very mild small vessel disease type changes.  Mild global atrophy without hydrocephalus.  Left parafalcine 1.1 cm meningioma without surrounding vasogenic edema unchanged.  No other intracranial mass or abnormal enhancement.  Prominent cervical spondylotic  changes with spinal stenosis and cord flattening C3-4, C4-5 C5-6.  Transverse ligament hypertrophy.  Partially empty sella.  Mild mucosal thickening/partial opacification ethmoid sinus air cells.  IMPRESSION: No acute infarct.  Left parafalcine 1.1 cm meningioma without surrounding vasogenic edema unchanged.  Prominent cervical spondylotic changes with spinal stenosis and cord flattening C3-4, C4-5 and C5-6.  Transverse ligament hypertrophy.  Mild mucosal thickening/partial opacification ethmoid sinus air cells.  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  Mild irregularity narrowing A1 segment anterior cerebral artery bilaterally.  Minimal irregularity M1 segment middle cerebral artery bilaterally.  Marked tandem stenosis A2 segment right anterior cerebral artery.  Moderate narrowing of the middle cerebral artery branch vessels bilaterally.  Left vertebral artery is dominant.  Mild to moderate narrowing involving portions of the visualized distal vertebral arteries bilaterally more notable on the right.  Nonvisualization left PICA.  Ectatic basilar artery with moderate narrowing distally.  Moderate superior cerebellar artery irregularity bilaterally.  Moderate to marked narrowing proximal aspect posterior cerebral artery greater on the right.  Moderate to marked tandem stenosis distal branches of the posterior cerebral artery bilaterally.  No aneurysm or vascular malformation.  IMPRESSION: Prominent intracranial atherosclerotic type changes as detailed above.  Cough  MRA NECK  Findings:Left vertebral artery arises directly from the aortic arch with proximal mild to slightly moderate narrowing.  Ectatic proximal right vertebral artery with mild narrowing.  Left vertebral artery is dominant.  Slight irregularity cervical segment of  the vertebral arteries bilaterally.  Ectatic right common carotid artery.  Mild smooth narrowing proximal right internal carotid artery.  2 cm beyond its  origin there is a fold and narrowing secondary to ectasia.  Ectatic left common carotid artery.  Narrowing proximal left internal carotid artery with 50% diameter stenosis.  Left internal carotid artery is ectatic beyond this region without high-grade narrowing.  IMPRESSION: Ectatic common carotid arteries and proximal internal carotid arteries without hemodynamically significant stenosis of either carotid bifurcation as noted above.  Left vertebral artery arises directly from the aortic arch with mild to slightly moderate narrowing.  Ectatic proximal right vertebral artery with mild narrowing.  Mild irregularity of the mid vertical cervical segment of the vertebral arteries bilaterally.   Original Report Authenticated By: Lacy Duverney, M.D.    Mr Laqueta Jean ZO Contrast  06/24/2012  *RADIOLOGY REPORT*  Clinical Data:  Vertigo.  MRI BRAIN WITH AND WITHOUT CONTRAST MRA HEAD WITHOUT CONTRAST MRA NECK WITHOUT AND WITH CONTRAST  Technique: Multiplanar, multiecho pulse sequences of the brain and surrounding structures were obtained according to standard protocol with and without intravenous contrast.  Angiographic images of the Circle of Willis were obtained using MRA technique without intravenous contrast.  Angiographic images of the neck were obtained using MRA technique without and with intravenous contrast.  Contrast:10 ml MultiHance.  Comparison:12/20/2011 CT.  11/20/2010 MR.  11/13/2009 MR and MR angiogram circle of Willis.  MRI HEAD  Findings: No acute infarct.  No intracranial hemorrhage.  Very mild small vessel disease type changes.  Mild global atrophy without hydrocephalus.  Left parafalcine 1.1 cm meningioma without surrounding vasogenic edema unchanged.  No other intracranial mass or abnormal enhancement.  Prominent cervical spondylotic changes with spinal stenosis and cord flattening C3-4, C4-5 C5-6.  Transverse ligament hypertrophy.  Partially empty sella.  Mild mucosal thickening/partial opacification  ethmoid sinus air cells.  IMPRESSION: No acute infarct.  Left parafalcine 1.1 cm meningioma without surrounding vasogenic edema unchanged.  Prominent cervical spondylotic changes with spinal stenosis and cord flattening C3-4, C4-5 and C5-6.  Transverse ligament hypertrophy.  Mild mucosal thickening/partial opacification ethmoid sinus air cells.  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  Mild irregularity narrowing A1 segment anterior cerebral artery bilaterally.  Minimal irregularity M1 segment middle cerebral artery bilaterally.  Marked tandem stenosis A2 segment right anterior cerebral artery.  Moderate narrowing of the middle cerebral artery branch vessels bilaterally.  Left vertebral artery is dominant.  Mild to moderate narrowing involving portions of the visualized distal vertebral arteries bilaterally more notable on the right.  Nonvisualization left PICA.  Ectatic basilar artery with moderate narrowing distally.  Moderate superior cerebellar artery irregularity bilaterally.  Moderate to marked narrowing proximal aspect posterior cerebral artery greater on the right.  Moderate to marked tandem stenosis distal branches of the posterior cerebral artery bilaterally.  No aneurysm or vascular malformation.  IMPRESSION: Prominent intracranial atherosclerotic type changes as detailed above.  Cough  MRA NECK  Findings:Left vertebral artery arises directly from the aortic arch with proximal mild to slightly moderate narrowing.  Ectatic proximal right vertebral artery with mild narrowing.  Left vertebral artery is dominant.  Slight irregularity cervical segment of the vertebral arteries bilaterally.  Ectatic right common carotid artery.  Mild smooth narrowing proximal right internal carotid artery.  2 cm beyond its origin there is a fold and narrowing secondary to ectasia.  Ectatic left common carotid artery.  Narrowing proximal left internal carotid artery with 50% diameter stenosis.  Left internal carotid artery is ectatic beyond this region without high-grade narrowing.  IMPRESSION: Ectatic common carotid arteries and proximal internal carotid arteries without hemodynamically significant stenosis of either carotid bifurcation as noted above.  Left vertebral artery arises directly from the aortic arch with mild to slightly moderate narrowing.  Ectatic proximal right vertebral artery with mild narrowing.  Mild irregularity of the mid vertical cervical segment of the vertebral arteries bilaterally.   Original Report Authenticated By: Lacy Duverney, M.D.      No diagnosis found.    MDM  Lightheadedness this morning associated with nausea and vomiting with ataxia gait disturbance. Normal when she went to bed last night. Denies vertigo but feels lightheaded and like she is going to fall over. No chest pain or shortness of breath. Neuro exam nonfocal. Unable to assess gait or perform romberg.  No acute infarct on MRI. Patient hypertensive and continues to complain of right back and leg pain consistent with her chronic pain from spinal stenosis. No focal weakness or new deficits today.  Patient also complains of urinary incontinence which is a chronic issue for loose the past 6 months and she claims this has been extensively worked up by her PCP and urologist.  Dizziness appears to be positional as worse with standing. However she is unable to ambulate his difficult time standing. MRI has rule out central etiology vertigo. We'll treat with meclizine, benzodiazepines as needed. As patient is unable to ambulate, will admit.    Date: 06/24/2012  Rate: 72  Rhythm: normal sinus rhythm  QRS Axis: normal  Intervals: normal  ST/T Wave abnormalities: normal  Conduction Disutrbances:none  Narrative Interpretation:   Old EKG Reviewed: none available     I personally performed the services described in this documentation, which was scribed in my presence. The recorded information  has been reviewed and is accurate.    Glynn Octave, MD 06/25/12 [phone]

## 2012-06-24 NOTE — Telephone Encounter (Signed)
Gracie called 1229 pm on 01.30.14 stating pt had some dizziness with PT today.. She states pt had concern.. Please call pt  At [phone] or gracie at [phone]

## 2012-06-24 NOTE — ED Notes (Signed)
Pt refusing to do standing orthostatics. Pt sts "Unless I have to, I'd rather not stand because the pain in my hip hurts so bad." RN aware

## 2012-06-24 NOTE — ED Notes (Signed)
Meal tray ordered 

## 2012-06-24 NOTE — ED Notes (Signed)
Per EMS- pt began having dizzinewss this morning. States that she had to brace herself to walk. Reports nausea adn vomiting x1 today. No neuro deficits wih EMS. VSS with EMS. Pt also has hx of spinal stenosis and reports pain in her rt hip and leg.

## 2012-06-24 NOTE — ED Notes (Signed)
Patient transported to MRI 

## 2012-06-24 NOTE — ED Notes (Signed)
Attempted to call report, nurse unavailable.

## 2012-06-24 NOTE — H&P (Signed)
PATIENT DETAILS Name: Shelly Martin Age: 79 y.o. Sex: female Date of Birth: 1919/01/31 Admit Date: 06/24/2012 WUJ:[id number],FAOZHY, MD   CHIEF COMPLAINT:  Dizziness with unsteady gait  HPI: Patient is a 79 year old Caucasian female with a prior history of TIA, hypertension, chronic low back pain, chronic urinary frequency and incontinence who presented to the hospital for evaluation of the above-noted complaints. The patient since last spring, she has had this feeling urinary frequency for which she has to wake up numerous times at night. She claims that she has been worked up extensively by PCP and her primary urologist. In any event, last night, while making numerous trips to the bathroom-patient noted that she was having dizziness and was unsteady on her feet. This  continued this morning, she did have one episode of vomiting. She was seen by her physical therapist later in the afternoon, and subsequently was brought to the ED for further evaluation. She denies any dysarthria or diplopia. She denies any headache. She has had no further vomiting since that one episode this morning. There is no history of shortness of breath or chest pain. There is no history of diarrhea. She's also been complaining of worsening low back pain which at times radiates to the thigh area on the right side. She has been taking as needed narcotics at home with no relief, she subsequently was given 1 mg of Dilantin here in the emergency room. In the emergency room she had MRI of the brain which did not show any acute infarct, however she continued to have dizziness and was unable to ambulate, as a result I was asked to admit this patient for further evaluation and treatment.  ALLERGIES:   Allergies  Allergen Reactions  . Amoxicillin   . Baclofen Other (See Comments)    "fuzzy in the eyes" and dizzy  . Hctz (Hydrochlorothiazide)     nausea  . Valium (Diazepam) Other (See Comments)    Pt became unresponsive and  O2 sats dropped.    PAST MEDICAL HISTORY: Past Medical History  Diagnosis Date  . Stroke     Mini, no residual  . Hypertension   . Restless legs syndrome     on Requip  . Ulcer causing bleeding and hole in wall of stomach or small intestine   . Gastric ulcer     years ago  . Arthritis     Back, knees  . Constipation   . Urinary bladder calculus   . Urinary incontinence   . Bowel incontinence   . Heart murmur     PAST SURGICAL HISTORY: Past Surgical History  Procedure Date  . Colon resection 50 years ago  . Tonsillectomy   . Uterine fibroid surgery   . Lumbar laminectomy/decompression microdiscectomy 06/25/2011    Procedure: LUMBAR LAMINECTOMY/DECOMPRESSION MICRODISCECTOMY;  Surgeon: Hewitt Shorts, MD;  Location: [facility];  Service: Neurosurgery;  Laterality: Right;  RIGHT Lumbar Two-Three hemilaminectomy and microdiskectomy  . Vaginal hysterectomy     MEDICATIONS AT HOME: Prior to Admission medications   Medication Sig Start Date End Date Taking? Authorizing Provider  gabapentin (NEURONTIN) 100 MG capsule Take 200 mg by mouth 3 (three) times daily.   Yes Historical Provider, MD  losartan (COZAAR) 50 MG tablet Take 50 mg by mouth 2 (two) times daily. 04/19/12  Yes Kendrick Ranch, MD  oxyCODONE-acetaminophen (PERCOCET) 5-325 MG per tablet Take 1 tablet by mouth 3 (three) times daily. 06/09/12  Yes Ashok Cordia, MD  rOPINIRole (REQUIP) 2 MG tablet Take  2 mg by mouth 2 (two) times daily.   Yes Historical Provider, MD    FAMILY HISTORY: Family History  Problem Relation Age of Onset  . Anesthesia problems Neg Hx   . Pancreatic cancer Brother   . Dementia Mother   . Stroke Father     SOCIAL HISTORY:  reports that she has never smoked. She has never used smokeless tobacco. She reports that she does not drink alcohol or use illicit drugs.  REVIEW OF SYSTEMS:  Constitutional:   No  weight loss, night sweats,  Fevers, chills, fatigue.  HEENT:    No  headaches, Difficulty swallowing,Tooth/dental problems,Sore throat,  No sneezing, itching, ear ache, nasal congestion, post nasal drip,   Cardio-vascular: No chest pain,  Orthopnea, PND, swelling in lower extremities, anasarca, dizziness, palpitations  GI:  No heartburn, indigestion, abdominal pain, nausea, vomiting, diarrhea, change in bowel habits, loss of appetite  Resp: No shortness of breath with exertion or at rest.  No excess mucus, no productive cough, No non-productive cough,  No coughing up of blood.No change in color of mucus.No wheezing.No chest wall deformity  Skin:  no rash or lesions.  GU:  no dysuria, change in color of urine, no urgency or frequency.  No flank pain.  Musculoskeletal: No joint pain or swelling.  No decreased range of motion.   Psych: No change in mood or affect. No depression or anxiety.  No memory loss.   PHYSICAL EXAM: Blood pressure 140/58, pulse 88, temperature 98.3 F (36.8 C), temperature source Oral, resp. rate 17, SpO2 96.00%.  General appearance :Awake, alert, not in any distress. Speech Clear. Not toxic Looking HEENT: Atraumatic and Normocephalic, pupils equally reactive to light and accomodation Neck: supple, no JVD. No cervical lymphadenopathy.  Chest:Good air entry bilaterally, no added sounds  CVS: S1 S2 regular, no murmurs.  Abdomen: Bowel sounds present, Non tender and not distended with no gaurding, rigidity or rebound. Extremities: B/L Lower Ext shows no edema, both legs are warm to touch Neurology: Awake alert, and oriented X 3, CN II-XII intact, Non focal- she has difficulty moving her right leg but it is secondary to pain.+Horizontal Nystagmus. Skin:No Rash Wounds:N/A  LABS ON ADMISSION:   Basename 06/24/12 1506  NA 137  K 4.2  CL 99  CO2 31  GLUCOSE 109*  BUN 21  CREATININE 0.74  CALCIUM 9.0  MG --  PHOS --    Basename 06/24/12 1506  AST 24  ALT 25  ALKPHOS 110  BILITOT 0.4  PROT 6.3  ALBUMIN 3.3*    No results found for this basename: LIPASE:2,AMYLASE:2 in the last 72 hours  Basename 06/24/12 1506  WBC 7.9  NEUTROABS 6.0  HGB 10.3*  HCT 31.4*  MCV 91.0  PLT 247    Basename 06/24/12 1506  CKTOTAL --  CKMB --  CKMBINDEX --  TROPONINI <0.30   No results found for this basename: DDIMER:2 in the last 72 hours No components found with this basename: POCBNP:3   RADIOLOGIC STUDIES ON ADMISSION: Ct Head Wo Contrast  06/24/2012  *RADIOLOGY REPORT*  Clinical Data: Dizzy/lightheaded since this morning  CT HEAD WITHOUT CONTRAST  Technique:  Contiguous axial images were obtained from the base of the skull through the vertex without contrast.  Comparison: Brain MRI MRI performed earlier today 08/01 30 2014 at 16 L8  Findings: No acute intracranial hemorrhage, acute infarction, hydrocephalus or midline shift.  A left anterior parafalcine meningioma is noted and better demonstrated on the recent brain MRI.  Whole brain cerebral and cerebellar volume loss appears commensurate with age.  Atherosclerotic calcifications noted within the cavernous carotid arteries bilaterally.  The globes are intact. Normal aeration of the mastoid air cells and visualized paranasal sinuses.  No acute calvarial abnormality.  IMPRESSION:  1.  No acute intracranial abnormality. 2.  Left frontal parafalcine meningioma is identifiable but better demonstrated on the recent brain MRI 3.  Cerebral and cerebellar volume loss commensurate with age 60.  Intracranial atherosclerosis   Original Report Authenticated By: Malachy Moan, M.D.    Mr [facility] Wo Contrast  06/24/2012  *RADIOLOGY REPORT*  Clinical Data:  Vertigo.  MRI BRAIN WITH AND WITHOUT CONTRAST MRA HEAD WITHOUT CONTRAST MRA NECK WITHOUT AND WITH CONTRAST  Technique: Multiplanar, multiecho pulse sequences of the brain and surrounding structures were obtained according to standard protocol with and without intravenous contrast.  Angiographic images of the Circle of  [place] were obtained using MRA technique without intravenous contrast.  Angiographic images of the neck were obtained using MRA technique without and with intravenous contrast.  Contrast:10 ml MultiHance.  Comparison:12/20/2011 CT.  11/20/2010 MR.  11/13/2009 MR and MR angiogram circle of Willis.  MRI HEAD  Findings: No acute infarct.  No intracranial hemorrhage.  Very mild small vessel disease type changes.  Mild global atrophy without hydrocephalus.  Left parafalcine 1.1 cm meningioma without surrounding vasogenic edema unchanged.  No other intracranial mass or abnormal enhancement.  Prominent cervical spondylotic changes with spinal stenosis and cord flattening C3-4, C4-5 C5-6.  Transverse ligament hypertrophy.  Partially empty sella.  Mild mucosal thickening/partial opacification ethmoid sinus air cells.  IMPRESSION: No acute infarct.  Left parafalcine 1.1 cm meningioma without surrounding vasogenic edema unchanged.  Prominent cervical spondylotic changes with spinal stenosis and cord flattening C3-4, C4-5 and C5-6.  Transverse ligament hypertrophy.  Mild mucosal thickening/partial opacification ethmoid sinus air cells.  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  Mild irregularity narrowing A1 segment anterior cerebral artery bilaterally.  Minimal irregularity M1 segment middle cerebral artery bilaterally.  Marked tandem stenosis A2 segment right anterior cerebral artery.  Moderate narrowing of the middle cerebral artery branch vessels bilaterally.  Left vertebral artery is dominant.  Mild to moderate narrowing involving portions of the visualized distal vertebral arteries bilaterally more notable on the right.  Nonvisualization left PICA.  Ectatic basilar artery with moderate narrowing distally.  Moderate superior cerebellar artery irregularity bilaterally.  Moderate to marked narrowing proximal aspect posterior cerebral artery greater on the right.  Moderate to marked tandem  stenosis distal branches of the posterior cerebral artery bilaterally.  No aneurysm or vascular malformation.  IMPRESSION: Prominent intracranial atherosclerotic type changes as detailed above.  Cough  MRA NECK  Findings:Left vertebral artery arises directly from the aortic arch with proximal mild to slightly moderate narrowing.  Ectatic proximal right vertebral artery with mild narrowing.  Left vertebral artery is dominant.  Slight irregularity cervical segment of the vertebral arteries bilaterally.  Ectatic right common carotid artery.  Mild smooth narrowing proximal right internal carotid artery.  2 cm beyond its origin there is a fold and narrowing secondary to ectasia.  Ectatic left common carotid artery.  Narrowing proximal left internal carotid artery with 50% diameter stenosis.  Left internal carotid artery is ectatic beyond this region without high-grade narrowing.  IMPRESSION: Ectatic common carotid arteries and proximal internal carotid arteries without hemodynamically significant stenosis of either carotid bifurcation as noted above.  Left vertebral artery arises directly from the aortic arch with  mild to slightly moderate narrowing.  Ectatic proximal right vertebral artery with mild narrowing.  Mild irregularity of the mid vertical cervical segment of the vertebral arteries bilaterally.   Original Report Authenticated By: Lacy Duverney, M.D.    Mr Angiogram Neck W Wo Contrast  06/24/2012  *RADIOLOGY REPORT*  Clinical Data:  Vertigo.  MRI BRAIN WITH AND WITHOUT CONTRAST MRA HEAD WITHOUT CONTRAST MRA NECK WITHOUT AND WITH CONTRAST  Technique: Multiplanar, multiecho pulse sequences of the brain and surrounding structures were obtained according to standard protocol with and without intravenous contrast.  Angiographic images of the Circle of [place] were obtained using MRA technique without intravenous contrast.  Angiographic images of the neck were obtained using MRA technique without and with intravenous  contrast.  Contrast:10 ml MultiHance.  Comparison:12/20/2011 CT.  11/20/2010 MR.  11/13/2009 MR and MR angiogram circle of [place].  MRI HEAD  Findings: No acute infarct.  No intracranial hemorrhage.  Very mild small vessel disease type changes.  Mild global atrophy without hydrocephalus.  Left parafalcine 1.1 cm meningioma without surrounding vasogenic edema unchanged.  No other intracranial mass or abnormal enhancement.  Prominent cervical spondylotic changes with spinal stenosis and cord flattening C3-4, C4-5 C5-6.  Transverse ligament hypertrophy.  Partially empty sella.  Mild mucosal thickening/partial opacification ethmoid sinus air cells.  IMPRESSION: No acute infarct.  Left parafalcine 1.1 cm meningioma without surrounding vasogenic edema unchanged.  Prominent cervical spondylotic changes with spinal stenosis and cord flattening C3-4, C4-5 and C5-6.  Transverse ligament hypertrophy.  Mild mucosal thickening/partial opacification ethmoid sinus air cells.  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  Mild irregularity narrowing A1 segment anterior cerebral artery bilaterally.  Minimal irregularity M1 segment middle cerebral artery bilaterally.  Marked tandem stenosis A2 segment right anterior cerebral artery.  Moderate narrowing of the middle cerebral artery branch vessels bilaterally.  Left vertebral artery is dominant.  Mild to moderate narrowing involving portions of the visualized distal vertebral arteries bilaterally more notable on the right.  Nonvisualization left PICA.  Ectatic basilar artery with moderate narrowing distally.  Moderate superior cerebellar artery irregularity bilaterally.  Moderate to marked narrowing proximal aspect posterior cerebral artery greater on the right.  Moderate to marked tandem stenosis distal branches of the posterior cerebral artery bilaterally.  No aneurysm or vascular malformation.  IMPRESSION: Prominent intracranial atherosclerotic type  changes as detailed above.  Cough  MRA NECK  Findings:Left vertebral artery arises directly from the aortic arch with proximal mild to slightly moderate narrowing.  Ectatic proximal right vertebral artery with mild narrowing.  Left vertebral artery is dominant.  Slight irregularity cervical segment of the vertebral arteries bilaterally.  Ectatic right common carotid artery.  Mild smooth narrowing proximal right internal carotid artery.  2 cm beyond its origin there is a fold and narrowing secondary to ectasia.  Ectatic left common carotid artery.  Narrowing proximal left internal carotid artery with 50% diameter stenosis.  Left internal carotid artery is ectatic beyond this region without high-grade narrowing.  IMPRESSION: Ectatic common carotid arteries and proximal internal carotid arteries without hemodynamically significant stenosis of either carotid bifurcation as noted above.  Left vertebral artery arises directly from the aortic arch with mild to slightly moderate narrowing.  Ectatic proximal right vertebral artery with mild narrowing.  Mild irregularity of the mid vertical cervical segment of the vertebral arteries bilaterally.   Original Report Authenticated By: Lacy Duverney, M.D.    Mr Laqueta Jean WU Contrast  06/24/2012  *RADIOLOGY REPORT*  Clinical  Data:  Vertigo.  MRI BRAIN WITH AND WITHOUT CONTRAST MRA HEAD WITHOUT CONTRAST MRA NECK WITHOUT AND WITH CONTRAST  Technique: Multiplanar, multiecho pulse sequences of the brain and surrounding structures were obtained according to standard protocol with and without intravenous contrast.  Angiographic images of the Circle of [place] were obtained using MRA technique without intravenous contrast.  Angiographic images of the neck were obtained using MRA technique without and with intravenous contrast.  Contrast:10 ml MultiHance.  Comparison:12/20/2011 CT.  11/20/2010 MR.  11/13/2009 MR and MR angiogram circle of Willis.  MRI HEAD  Findings: No acute infarct.  No  intracranial hemorrhage.  Very mild small vessel disease type changes.  Mild global atrophy without hydrocephalus.  Left parafalcine 1.1 cm meningioma without surrounding vasogenic edema unchanged.  No other intracranial mass or abnormal enhancement.  Prominent cervical spondylotic changes with spinal stenosis and cord flattening C3-4, C4-5 C5-6.  Transverse ligament hypertrophy.  Partially empty sella.  Mild mucosal thickening/partial opacification ethmoid sinus air cells.  IMPRESSION: No acute infarct.  Left parafalcine 1.1 cm meningioma without surrounding vasogenic edema unchanged.  Prominent cervical spondylotic changes with spinal stenosis and cord flattening C3-4, C4-5 and C5-6.  Transverse ligament hypertrophy.  Mild mucosal thickening/partial opacification ethmoid sinus air cells.  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  Mild irregularity narrowing A1 segment anterior cerebral artery bilaterally.  Minimal irregularity M1 segment middle cerebral artery bilaterally.  Marked tandem stenosis A2 segment right anterior cerebral artery.  Moderate narrowing of the middle cerebral artery branch vessels bilaterally.  Left vertebral artery is dominant.  Mild to moderate narrowing involving portions of the visualized distal vertebral arteries bilaterally more notable on the right.  Nonvisualization left PICA.  Ectatic basilar artery with moderate narrowing distally.  Moderate superior cerebellar artery irregularity bilaterally.  Moderate to marked narrowing proximal aspect posterior cerebral artery greater on the right.  Moderate to marked tandem stenosis distal branches of the posterior cerebral artery bilaterally.  No aneurysm or vascular malformation.  IMPRESSION: Prominent intracranial atherosclerotic type changes as detailed above.  Cough  MRA NECK  Findings:Left vertebral artery arises directly from the aortic arch with proximal mild to slightly moderate narrowing.  Ectatic  proximal right vertebral artery with mild narrowing.  Left vertebral artery is dominant.  Slight irregularity cervical segment of the vertebral arteries bilaterally.  Ectatic right common carotid artery.  Mild smooth narrowing proximal right internal carotid artery.  2 cm beyond its origin there is a fold and narrowing secondary to ectasia.  Ectatic left common carotid artery.  Narrowing proximal left internal carotid artery with 50% diameter stenosis.  Left internal carotid artery is ectatic beyond this region without high-grade narrowing.  IMPRESSION: Ectatic common carotid arteries and proximal internal carotid arteries without hemodynamically significant stenosis of either carotid bifurcation as noted above.  Left vertebral artery arises directly from the aortic arch with mild to slightly moderate narrowing.  Ectatic proximal right vertebral artery with mild narrowing.  Mild irregularity of the mid vertical cervical segment of the vertebral arteries bilaterally.   Original Report Authenticated By: Lacy Duverney, M.D.     ASSESSMENT AND PLAN: Present on Admission:  . Vertigo - She does have horizontal nystagmus-she did have recent episode of a runny nose-I wonder if she has peripheral were diagonal secondary to labyrinthitis. She does not have vertigo when she is lying still or lying down, but he gets them when she is ambulating-she could also have BPPV. - MRI of the brain is  negative for acute infarct.  - Will get vestibular PT  - Start aspirin  - Start meclizine   . RLS (restless legs syndrome) - Continue his Requip   . Acute exacerbation of chronic low back pain - As needed Percocet/Norco for moderate pain, as needed dilated for severe pain  - MiraLax for bowel regimen  - PT eval  - Reassess in a.m.-if she still has significant pain-please consult Dr. Jule Ser, who the patient follows up with the outpatient setting   . Spinal stenosis, lumbar - This is a chronic issue, she has seen Dr.   Jule Ser in the outpatient setting.  - As above   . Urinary incontinence - This is a chronic issue  - Outpatient followup with urology   . Hypertension - Continue valsartan   Further plan will depend as patient's clinical course evolves and further radiologic and laboratory data become available. Patient will be monitored closely.  DVT Prophylaxis: - Prophylactic Lovenox   Code Status: Full code   Total time spent for admission equals 45 minutes.  [facility] Pager [phone]  If 7PM-7AM, please contact night-coverage [email or web address] Password [email or web address] 06/24/2012, 7:28 PM

## 2012-06-25 ENCOUNTER — Encounter: Payer: Medicare Other | Admitting: Physical Medicine and Rehabilitation

## 2012-06-25 DIAGNOSIS — K5900 Constipation, unspecified: Secondary | ICD-10-CM

## 2012-06-25 DIAGNOSIS — I359 Nonrheumatic aortic valve disorder, unspecified: Secondary | ICD-10-CM

## 2012-06-25 MED ORDER — OXYCODONE-ACETAMINOPHEN 5-325 MG PO TABS
1.0000 | ORAL_TABLET | Freq: Three times a day (TID) | ORAL | Status: DC
  Administered 2012-06-25 – 2012-06-26 (×4): 1 via ORAL
  Filled 2012-06-25 (×4): qty 1

## 2012-06-25 MED ORDER — HYDROMORPHONE HCL PF 1 MG/ML IJ SOLN
0.5000 mg | INTRAMUSCULAR | Status: DC | PRN
Start: 2012-06-25 — End: 2012-06-26
  Administered 2012-06-25: 0.5 mg via INTRAVENOUS
  Filled 2012-06-25: qty 1

## 2012-06-25 NOTE — Progress Notes (Signed)
[facility] PROGRESS NOTE  Shelly Martin ZOX:[id number] DOB: [birth year] DOA: 06/24/2012 PCP: Levon Hedger, MD  Assessment/Plan: Vertigo: - She does have horizontal nystagmus-she did have recent episode of a runny nose-I wonder if she has peripheral nystagmus secondary to labyrinthitis. She does not have vertigo when she is lying still or lying down, but he gets them when she is ambulating - she could also have BPPV.  - MRI of the brain is negative for acute infarct.  - Will get vestibular PT  - Started on  meclizine   . RLS (restless legs syndrome)  - Continue his Requip   . Acute exacerbation of chronic low back pain  - As needed Percocet/Norco for moderate pain, as needed dilated for severe pain  - MiraLax for bowel regimen  - PT eval    . Spinal stenosis, lumbar  - This is a chronic issue, she has seen Dr. Jule Ser in the outpatient setting, but he cannot do anymore surgeries  - As above   . Urinary incontinence  - This is a chronic issue  - Outpatient followup with urology   . Hypertension  - Continue valsartan       HPI/Subjective: C/o 10/10 back pain, also severe dizziness when getting up   Objective: Filed Vitals:   06/24/12 1900 06/24/12 2130 06/24/12 2235 06/25/12 0412  BP: 140/58  170/84 125/52  Pulse: 88 89 97 69  Temp:   98.4 F (36.9 C) 99.8 F (37.7 C)  TempSrc:   Oral Oral  Resp: 17 22  20   Height:   4\' 9"  (1.448 m)   Weight:   54.5 kg (120 lb 2.4 oz)   SpO2: 96%  98% 97%    Intake/Output Summary (Last 24 hours) at 06/25/12 0740 Last data filed at 06/25/12 0700  Gross per 24 hour  Intake    400 ml  Output    600 ml  Net   -200 ml   Filed Weights   06/24/12 2235  Weight: 54.5 kg (120 lb 2.4 oz)    Exam:   General:  Alert   Cardiovascular: rrr  Respiratory: ctab  Abdomen: soft, nt   Data Reviewed: Basic Metabolic Panel:  Lab 06/24/12 8119 06/24/12 1506  NA -- 137  K -- 4.2  CL -- 99  CO2 -- 31  GLUCOSE --  109*  BUN -- 21  CREATININE 0.79 0.74  CALCIUM -- 9.0  MG -- --  PHOS -- --   Liver Function Tests:  Lab 06/24/12 1506  AST 24  ALT 25  ALKPHOS 110  BILITOT 0.4  PROT 6.3  ALBUMIN 3.3*   No results found for this basename: LIPASE:5,AMYLASE:5 in the last 168 hours No results found for this basename: AMMONIA:5 in the last 168 hours CBC:  Lab 06/24/12 2146 06/24/12 1506  WBC 8.6 7.9  NEUTROABS -- 6.0  HGB 10.4* 10.3*  HCT 31.5* 31.4*  MCV 90.8 91.0  PLT 247 247   Cardiac Enzymes:  Lab 06/24/12 1506  CKTOTAL --  CKMB --  CKMBINDEX --  TROPONINI <0.30   BNP (last 3 results) No results found for this basename: PROBNP:3 in the last 8760 hours CBG: No results found for this basename: GLUCAP:5 in the last 168 hours  No results found for this or any previous visit (from the past 240 hour(s)).   Studies: Ct Head Wo Contrast  06/24/2012  *RADIOLOGY REPORT*  Clinical Data: Dizzy/lightheaded since this morning  CT HEAD WITHOUT CONTRAST  Technique:  Contiguous  axial images were obtained from the base of the skull through the vertex without contrast.  Comparison: Brain MRI MRI performed earlier today 08/01 30 2014 at 16 L8  Findings: No acute intracranial hemorrhage, acute infarction, hydrocephalus or midline shift.  A left anterior parafalcine meningioma is noted and better demonstrated on the recent brain MRI.  Whole brain cerebral and cerebellar volume loss appears commensurate with age.  Atherosclerotic calcifications noted within the cavernous carotid arteries bilaterally.  The globes are intact. Normal aeration of the mastoid air cells and visualized paranasal sinuses.  No acute calvarial abnormality.  IMPRESSION:  1.  No acute intracranial abnormality. 2.  Left frontal parafalcine meningioma is identifiable but better demonstrated on the recent brain MRI 3.  Cerebral and cerebellar volume loss commensurate with age 30.  Intracranial atherosclerosis   Original Report Authenticated  By: Malachy Moan, M.D.    Mr [facility] Wo Contrast  06/24/2012  *RADIOLOGY REPORT*  Clinical Data:  Vertigo.  MRI BRAIN WITH AND WITHOUT CONTRAST MRA HEAD WITHOUT CONTRAST MRA NECK WITHOUT AND WITH CONTRAST  Technique: Multiplanar, multiecho pulse sequences of the brain and surrounding structures were obtained according to standard protocol with and without intravenous contrast.  Angiographic images of the Circle of Willis were obtained using MRA technique without intravenous contrast.  Angiographic images of the neck were obtained using MRA technique without and with intravenous contrast.  Contrast:10 ml MultiHance.  Comparison:12/20/2011 CT.  11/20/2010 MR.  11/13/2009 MR and MR angiogram circle of Willis.  MRI HEAD  Findings: No acute infarct.  No intracranial hemorrhage.  Very mild small vessel disease type changes.  Mild global atrophy without hydrocephalus.  Left parafalcine 1.1 cm meningioma without surrounding vasogenic edema unchanged.  No other intracranial mass or abnormal enhancement.  Prominent cervical spondylotic changes with spinal stenosis and cord flattening C3-4, C4-5 C5-6.  Transverse ligament hypertrophy.  Partially empty sella.  Mild mucosal thickening/partial opacification ethmoid sinus air cells.  IMPRESSION: No acute infarct.  Left parafalcine 1.1 cm meningioma without surrounding vasogenic edema unchanged.  Prominent cervical spondylotic changes with spinal stenosis and cord flattening C3-4, C4-5 and C5-6.  Transverse ligament hypertrophy.  Mild mucosal thickening/partial opacification ethmoid sinus air cells.  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  Mild irregularity narrowing A1 segment anterior cerebral artery bilaterally.  Minimal irregularity M1 segment middle cerebral artery bilaterally.  Marked tandem stenosis A2 segment right anterior cerebral artery.  Moderate narrowing of the middle cerebral artery branch vessels bilaterally.  Left  vertebral artery is dominant.  Mild to moderate narrowing involving portions of the visualized distal vertebral arteries bilaterally more notable on the right.  Nonvisualization left PICA.  Ectatic basilar artery with moderate narrowing distally.  Moderate superior cerebellar artery irregularity bilaterally.  Moderate to marked narrowing proximal aspect posterior cerebral artery greater on the right.  Moderate to marked tandem stenosis distal branches of the posterior cerebral artery bilaterally.  No aneurysm or vascular malformation.  IMPRESSION: Prominent intracranial atherosclerotic type changes as detailed above.  Cough  MRA NECK  Findings:Left vertebral artery arises directly from the aortic arch with proximal mild to slightly moderate narrowing.  Ectatic proximal right vertebral artery with mild narrowing.  Left vertebral artery is dominant.  Slight irregularity cervical segment of the vertebral arteries bilaterally.  Ectatic right common carotid artery.  Mild smooth narrowing proximal right internal carotid artery.  2 cm beyond its origin there is a fold and narrowing secondary to ectasia.  Ectatic left common carotid  artery.  Narrowing proximal left internal carotid artery with 50% diameter stenosis.  Left internal carotid artery is ectatic beyond this region without high-grade narrowing.  IMPRESSION: Ectatic common carotid arteries and proximal internal carotid arteries without hemodynamically significant stenosis of either carotid bifurcation as noted above.  Left vertebral artery arises directly from the aortic arch with mild to slightly moderate narrowing.  Ectatic proximal right vertebral artery with mild narrowing.  Mild irregularity of the mid vertical cervical segment of the vertebral arteries bilaterally.   Original Report Authenticated By: Lacy Duverney, M.D.    Mr Angiogram Neck W Wo Contrast  06/24/2012  *RADIOLOGY REPORT*  Clinical Data:  Vertigo.  MRI BRAIN WITH AND WITHOUT CONTRAST MRA HEAD  WITHOUT CONTRAST MRA NECK WITHOUT AND WITH CONTRAST  Technique: Multiplanar, multiecho pulse sequences of the brain and surrounding structures were obtained according to standard protocol with and without intravenous contrast.  Angiographic images of the Circle of Willis were obtained using MRA technique without intravenous contrast.  Angiographic images of the neck were obtained using MRA technique without and with intravenous contrast.  Contrast:10 ml MultiHance.  Comparison:12/20/2011 CT.  11/20/2010 MR.  11/13/2009 MR and MR angiogram circle of Willis.  MRI HEAD  Findings: No acute infarct.  No intracranial hemorrhage.  Very mild small vessel disease type changes.  Mild global atrophy without hydrocephalus.  Left parafalcine 1.1 cm meningioma without surrounding vasogenic edema unchanged.  No other intracranial mass or abnormal enhancement.  Prominent cervical spondylotic changes with spinal stenosis and cord flattening C3-4, C4-5 C5-6.  Transverse ligament hypertrophy.  Partially empty sella.  Mild mucosal thickening/partial opacification ethmoid sinus air cells.  IMPRESSION: No acute infarct.  Left parafalcine 1.1 cm meningioma without surrounding vasogenic edema unchanged.  Prominent cervical spondylotic changes with spinal stenosis and cord flattening C3-4, C4-5 and C5-6.  Transverse ligament hypertrophy.  Mild mucosal thickening/partial opacification ethmoid sinus air cells.  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  Mild irregularity narrowing A1 segment anterior cerebral artery bilaterally.  Minimal irregularity M1 segment middle cerebral artery bilaterally.  Marked tandem stenosis A2 segment right anterior cerebral artery.  Moderate narrowing of the middle cerebral artery branch vessels bilaterally.  Left vertebral artery is dominant.  Mild to moderate narrowing involving portions of the visualized distal vertebral arteries bilaterally more notable on the right.   Nonvisualization left PICA.  Ectatic basilar artery with moderate narrowing distally.  Moderate superior cerebellar artery irregularity bilaterally.  Moderate to marked narrowing proximal aspect posterior cerebral artery greater on the right.  Moderate to marked tandem stenosis distal branches of the posterior cerebral artery bilaterally.  No aneurysm or vascular malformation.  IMPRESSION: Prominent intracranial atherosclerotic type changes as detailed above.  Cough  MRA NECK  Findings:Left vertebral artery arises directly from the aortic arch with proximal mild to slightly moderate narrowing.  Ectatic proximal right vertebral artery with mild narrowing.  Left vertebral artery is dominant.  Slight irregularity cervical segment of the vertebral arteries bilaterally.  Ectatic right common carotid artery.  Mild smooth narrowing proximal right internal carotid artery.  2 cm beyond its origin there is a fold and narrowing secondary to ectasia.  Ectatic left common carotid artery.  Narrowing proximal left internal carotid artery with 50% diameter stenosis.  Left internal carotid artery is ectatic beyond this region without high-grade narrowing.  IMPRESSION: Ectatic common carotid arteries and proximal internal carotid arteries without hemodynamically significant stenosis of either carotid bifurcation as noted above.  Left vertebral artery arises  directly from the aortic arch with mild to slightly moderate narrowing.  Ectatic proximal right vertebral artery with mild narrowing.  Mild irregularity of the mid vertical cervical segment of the vertebral arteries bilaterally.   Original Report Authenticated By: Lacy Duverney, M.D.    Mr Laqueta [facility] ZO Contrast  06/24/2012  *RADIOLOGY REPORT*  Clinical Data:  Vertigo.  MRI BRAIN WITH AND WITHOUT CONTRAST MRA HEAD WITHOUT CONTRAST MRA NECK WITHOUT AND WITH CONTRAST  Technique: Multiplanar, multiecho pulse sequences of the brain and surrounding structures were obtained according to  standard protocol with and without intravenous contrast.  Angiographic images of the Circle of Willis were obtained using MRA technique without intravenous contrast.  Angiographic images of the neck were obtained using MRA technique without and with intravenous contrast.  Contrast:10 ml MultiHance.  Comparison:12/20/2011 CT.  11/20/2010 MR.  11/13/2009 MR and MR angiogram circle of Willis.  MRI HEAD  Findings: No acute infarct.  No intracranial hemorrhage.  Very mild small vessel disease type changes.  Mild global atrophy without hydrocephalus.  Left parafalcine 1.1 cm meningioma without surrounding vasogenic edema unchanged.  No other intracranial mass or abnormal enhancement.  Prominent cervical spondylotic changes with spinal stenosis and cord flattening C3-4, C4-5 C5-6.  Transverse ligament hypertrophy.  Partially empty sella.  Mild mucosal thickening/partial opacification ethmoid sinus air cells.  IMPRESSION: No acute infarct.  Left parafalcine 1.1 cm meningioma without surrounding vasogenic edema unchanged.  Prominent cervical spondylotic changes with spinal stenosis and cord flattening C3-4, C4-5 and C5-6.  Transverse ligament hypertrophy.  Mild mucosal thickening/partial opacification ethmoid sinus air cells.  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  Mild irregularity narrowing A1 segment anterior cerebral artery bilaterally.  Minimal irregularity M1 segment middle cerebral artery bilaterally.  Marked tandem stenosis A2 segment right anterior cerebral artery.  Moderate narrowing of the middle cerebral artery branch vessels bilaterally.  Left vertebral artery is dominant.  Mild to moderate narrowing involving portions of the visualized distal vertebral arteries bilaterally more notable on the right.  Nonvisualization left PICA.  Ectatic basilar artery with moderate narrowing distally.  Moderate superior cerebellar artery irregularity bilaterally.  Moderate to marked  narrowing proximal aspect posterior cerebral artery greater on the right.  Moderate to marked tandem stenosis distal branches of the posterior cerebral artery bilaterally.  No aneurysm or vascular malformation.  IMPRESSION: Prominent intracranial atherosclerotic type changes as detailed above.  Cough  MRA NECK  Findings:Left vertebral artery arises directly from the aortic arch with proximal mild to slightly moderate narrowing.  Ectatic proximal right vertebral artery with mild narrowing.  Left vertebral artery is dominant.  Slight irregularity cervical segment of the vertebral arteries bilaterally.  Ectatic right common carotid artery.  Mild smooth narrowing proximal right internal carotid artery.  2 cm beyond its origin there is a fold and narrowing secondary to ectasia.  Ectatic left common carotid artery.  Narrowing proximal left internal carotid artery with 50% diameter stenosis.  Left internal carotid artery is ectatic beyond this region without high-grade narrowing.  IMPRESSION: Ectatic common carotid arteries and proximal internal carotid arteries without hemodynamically significant stenosis of either carotid bifurcation as noted above.  Left vertebral artery arises directly from the aortic arch with mild to slightly moderate narrowing.  Ectatic proximal right vertebral artery with mild narrowing.  Mild irregularity of the mid vertical cervical segment of the vertebral arteries bilaterally.   Original Report Authenticated By: Lacy Duverney, M.D.     Scheduled Meds:   . aspirin  EC  81 mg Oral Daily  . enoxaparin (LOVENOX) injection  40 mg Subcutaneous Q24H  . gabapentin  200 mg Oral TID  . losartan  50 mg Oral BID  . meclizine  25 mg Oral TID  . polyethylene glycol  17 g Oral BID  . rOPINIRole  2 mg Oral BID  . sodium chloride  3 mL Intravenous Q12H   Continuous Infusions:   . sodium chloride 1,000 mL (06/24/12 2229)    Principal Problem:  *Vertigo Active Problems:  Urinary incontinence   Hypertension  RLS (restless legs syndrome)  Spinal stenosis, lumbar  Acute exacerbation of chronic low back pain     Shelly Martin  [facility] Pager [phone]. If 8PM-8AM, please contact night-coverage at [email or web address], password [facility] 06/25/2012, 7:40 AM  LOS: 1 day

## 2012-06-25 NOTE — Evaluation (Signed)
Physical Therapy Evaluation Patient Details Name: Shelly Martin MRN: [id number] DOB: 1919-05-06 Today's Date: 06/25/2012 Time: 4098-1191 PT Time Calculation (min): 56 min  PT Assessment / Plan / Recommendation Clinical Impression  pt admitted with dizziness and unsteady gait, simultaneously she's having a flare up of ?sciatic pain.  Conducted a brief vestibular assessment including subjective questioning and based on subj answers assessed for BPPV.  Completed the modified Hallpike-Dix with mild nystagmus downbeating to the L.  No nystagmus on dizziness occur down to the R.  Similar signs happened when treating with the Epply maneuver..  All tests are made difficult by the extreme pain in her R hip.  Gait is very antalgic, but after treatment for BPPV, did not appear to be compromised by dysequilibrium.  Will reassess for changes before D/C 06/26/12.    PT Assessment  Patient needs continued PT services    Follow Up Recommendations  Home health PT;Supervision for mobility/OOB    Does the patient have the potential to tolerate intense rehabilitation      Barriers to Discharge        Equipment Recommendations  None recommended by PT    Recommendations for Other Services     Frequency Min 2X/week    Precautions / Restrictions Precautions Precautions: Fall Restrictions Weight Bearing Restrictions: No   Pertinent Vitals/Pain Unbearable pain in R hip/leg at times      Mobility  Bed Mobility Bed Mobility: Right Sidelying to Sit;Left Sidelying to Sit;Sitting - Scoot to Edge of Bed Right Sidelying to Sit: 4: Min assist;3: Mod assist;HOB flat (depending on pain) Left Sidelying to Sit: 4: Min assist;3: Mod assist;HOB flat;Other (comment) (depending on pain) Sitting - Scoot to Edge of Bed: 6: Modified independent (Device/Increase time) Details for Bed Mobility Assistance: pain limiting .  reinforced best technique Transfers Transfers: Sit to Stand;Stand to Sit Sit to Stand: 4: Min  assist;Without upper extremity assist;From bed Stand to Sit: 4: Min assist;With upper extremity assist;To bed Details for Transfer Assistance: cues for safest technique; steady assist due to pain Ambulation/Gait Ambulation/Gait Assistance: 4: Min assist;3: Mod assist Ambulation Distance (Feet): 35 Feet Assistive device: 1 person hand held assist Ambulation/Gait Assistance Details: antalgic gait, mild hip drop Gait Pattern: Step-through pattern;Antalgic Stairs: No Wheelchair Mobility Wheelchair Mobility: No    Shoulder Instructions     Exercises     PT Diagnosis: Difficulty walking;Acute pain;Other (comment) (vertigo)  PT Problem List: Decreased activity tolerance;Decreased balance;Pain;Other (comment) (s/s of BPPV or labryrinthitis) PT Treatment Interventions: Other (comment) (vestibular reassessment)   PT Goals Acute Rehab PT Goals PT Goal Formulation: With patient Time For Goal Achievement: 06/28/12 Potential to Achieve Goals: Fair Pt will Ambulate: 16 - 50 feet;with supervision;with least restrictive assistive device (and dizziness <=2/10) PT Goal: Ambulate - Progress: Goal set today Additional Goals Additional Goal #1: Reassess for changes/benefit of vestibular treatment 06/25/12 and treat further to diminish s/s of vertigo to <=2/10 PT Goal: Additional Goal #1 - Progress: Goal set today  Visit Information  Last PT Received On: 06/25/12 Assistance Needed: +1    Subjective Data  Subjective: Well, I feel fuzzy.Marland Kitchenkind of misty Patient Stated Goal: I can't go home with this pain and the dizziness, I want to get some relief   Prior Functioning  Home Living Lives With: Daughter (who works) Available Help at Discharge: Family;Available PRN/intermittently Type of Home: House Home Access: Stairs to enter Home Layout: One level Home Adaptive Equipment: Walker - rolling;Straight cane Prior Function Level of Independence: Independent with assistive  device(s) Communication Communication: No difficulties    Cognition  Overall Cognitive Status: Appears within functional limits for tasks assessed/performed Arousal/Alertness: Awake/alert Orientation Level: Oriented X4 / Intact Behavior During Session: [facility] for tasks performed    Extremity/Trunk Assessment Right Upper Extremity Assessment RUE ROM/Strength/Tone: Within functional levels Left Upper Extremity Assessment LUE ROM/Strength/Tone: Within functional levels Right Lower Extremity Assessment RLE ROM/Strength/Tone: [facility] for tasks assessed;Deficits RLE ROM/Strength/Tone Deficits: weak from ?sciatica pain Left Lower Extremity Assessment LLE ROM/Strength/Tone: WFL for tasks assessed   Balance Balance Balance Assessed: Yes  End of Session PT - End of Session Activity Tolerance: Patient limited by pain Patient left: in bed;with call bell/phone within reach Nurse Communication: Mobility status  GP Functional Assessment Tool Used: clinical judgement Functional Limitation: Mobility: Walking and moving around Mobility: Walking and Moving Around Current Status ([id number]): At least 20 percent but less than 40 percent impaired, limited or restricted Mobility: Walking and Moving Around Goal Status [phone]): At least 1 percent but less than 20 percent impaired, limited or restricted   Keshauna Degraffenreid, Eliseo Gum 06/25/2012, 3:54 PM  06/25/2012  [place] Bing, PT [phone] (pager)

## 2012-06-25 NOTE — Progress Notes (Signed)
  Echocardiogram 2D Echocardiogram has been performed.  Shelly Martin 06/25/2012, 3:10 PM

## 2012-06-26 MED ORDER — IBUPROFEN 400 MG PO TABS: 400.0000 mg | ORAL_TABLET | Freq: Four times a day (QID) | ORAL | Status: DC | PRN

## 2012-06-26 MED ORDER — GABAPENTIN 100 MG PO CAPS
200.0000 mg | ORAL_CAPSULE | Freq: Four times a day (QID) | ORAL | Status: DC
  Administered 2012-06-26 (×2): 200 mg via ORAL
  Filled 2012-06-26 (×3): qty 2

## 2012-06-26 MED ORDER — GABAPENTIN 100 MG PO CAPS: 200.0000 mg | ORAL_CAPSULE | Freq: Four times a day (QID) | ORAL | Status: DC

## 2012-06-26 MED ORDER — MECLIZINE HCL 25 MG PO TABS: 25.0000 mg | ORAL_TABLET | Freq: Three times a day (TID) | ORAL | Status: DC | PRN

## 2012-06-26 NOTE — Discharge Summary (Signed)
Physician Discharge Summary  Shelly Martin ZHY:[id number] DOB: 1918/10/20 DOA: 06/24/2012  PCP: Levon Hedger, MD  Admit date: 06/24/2012 Discharge date: 06/26/2012  Time spent: 55 minutes  Recommendations for Outpatient Follow-up:  1. Continue to followup at the pain management clinic 2. Continue home physical therapy 3. If vertigo remains persistent patient may benefit from a trial of prednisone  Discharge Diagnoses:  Vertigo - MRI negative for stroke - most likely etiology vestibular neuronitis or BPPV  Urinary incontinence  Hypertension  RLS (restless legs syndrome)  Spinal stenosis, lumbar - chronic  Acute exacerbation of chronic low back pain   Discharge Condition: Fair  Diet recommendation: Regular  Filed Weights   06/24/12 2235  Weight: 54.5 kg (120 lb 2.4 oz)    History of present illness:  Patient is a 79 year old Caucasian female with a prior history of TIA, hypertension, chronic low back pain, chronic urinary frequency and incontinence who presented to the hospital for evaluation of the above-noted complaints. The patient since last spring, she has had this feeling urinary frequency for which she has to wake up numerous times at night. She claims that she has been worked up extensively by PCP and her primary urologist. In any event, last night, while making numerous trips to the bathroom-patient noted that she was having dizziness and was unsteady on her feet. This continued this morning, she did have one episode of vomiting. She was seen by her physical therapist later in the afternoon, and subsequently was brought to the ED for further evaluation. She denies any dysarthria or diplopia. She denies any headache. She has had no further vomiting since that one episode this morning. There is no history of shortness of breath or chest pain. There is no history of diarrhea. She's also been complaining of worsening low back pain which at times radiates to the thigh area on  the right side. She has been taking as needed narcotics at home with no relief, she subsequently was given 1 mg of Dilantin here in the emergency room. In the emergency room she had MRI of the brain which did not show any acute infarct, however she continued to have dizziness and was unable to ambulate, as a result I was asked to admit this patient for further evaluation and treatment.      Hospital Course:  Vertigo:  - She does have horizontal nystagmus-she did have recent episode of a runny nose-I wonder if she has peripheral nystagmus secondary to labyrinthitis. She does not have vertigo when she is lying still or lying down, but he gets them when she is ambulating - she could also have BPPV.  - MRI of the brain is negative for acute infarct.  - Started on meclizine as needed for vertigo If she continues to have symptoms she could be placed on a trial of prednisone  . RLS (restless legs syndrome)  - Continued on Requip   . Acute exacerbation of chronic low back pain  - The patient was continued on her regular dose of Percocet. She received as needed doses of hydromorphone intravenously - We have increased the gabapentin to 200 mg 4 times a day - We have added ibuprofen as needed   . Spinal stenosis, lumbar  - This is a chronic issue, she has seen Dr. Jule Ser in the outpatient setting, but he cannot do anymore surgeries    . Urinary incontinence  - This is a chronic issue  - Outpatient followup with urology as previously scheduled  . Hypertension  -  Continue valsartan        Procedures: none Consultations:  none  Discharge Exam: Filed Vitals:   06/25/12 1426 06/25/12 2030 06/25/12 2100 06/26/12 0500  BP: 148/58 114/61 160/78 146/72  Pulse: 78 18  68  Temp: 98.4 F (36.9 C) 98.3 F (36.8 C)  98.1 F (36.7 C)  TempSrc: Oral     Resp: 21 18  18   Height:      Weight:      SpO2: 98% 93%  94%    General: axox3 Cardiovascular: rrr Respiratory:  ctab  Discharge Instructions  Discharge Orders    Future Orders Please Complete By Expires   Diet general      Increase activity slowly          Medication List     As of 06/26/2012  6:37 PM    TAKE these medications         gabapentin 100 MG capsule   Commonly known as: NEURONTIN   Take 2 capsules (200 mg total) by mouth 4 (four) times daily.      ibuprofen 400 MG tablet   Commonly known as: ADVIL,MOTRIN   Take 1 tablet (400 mg total) by mouth every 6 (six) hours as needed for pain.      losartan 50 MG tablet   Commonly known as: COZAAR   Take 50 mg by mouth 2 (two) times daily.      meclizine 25 MG tablet   Commonly known as: ANTIVERT   Take 1 tablet (25 mg total) by mouth 3 (three) times daily as needed for dizziness.      oxyCODONE-acetaminophen 5-325 MG per tablet   Commonly known as: PERCOCET/ROXICET   Take 1 tablet by mouth 3 (three) times daily.      rOPINIRole 2 MG tablet   Commonly known as: REQUIP   Take 2 mg by mouth 2 (two) times daily.           Follow-up Information    Schedule an appointment as soon as possible for a visit with [facility], MD.   Contact information:   [address] [place] [address] [phone]           The results of significant diagnostics from this hospitalization (including imaging, microbiology, ancillary and laboratory) are listed below for reference.    Significant Diagnostic Studies: Ct Head Wo Contrast  06/24/2012  *RADIOLOGY REPORT*  Clinical Data: Dizzy/lightheaded since this morning  CT HEAD WITHOUT CONTRAST  Technique:  Contiguous axial images were obtained from the base of the skull through the vertex without contrast.  Comparison: Brain MRI MRI performed earlier today 08/01 30 2014 at 16 L8  Findings: No acute intracranial hemorrhage, acute infarction, hydrocephalus or midline shift.  A left anterior parafalcine meningioma is noted and better demonstrated on the recent brain MRI.   Whole brain cerebral and cerebellar volume loss appears commensurate with age.  Atherosclerotic calcifications noted within the cavernous carotid arteries bilaterally.  The globes are intact. Normal aeration of the mastoid air cells and visualized paranasal sinuses.  No acute calvarial abnormality.  IMPRESSION:  1.  No acute intracranial abnormality. 2.  Left frontal parafalcine meningioma is identifiable but better demonstrated on the recent brain MRI 3.  Cerebral and cerebellar volume loss commensurate with age 44.  Intracranial atherosclerosis   Original Report Authenticated By: Malachy Moan, M.D.    Mr [facility] Wo Contrast  06/24/2012  *RADIOLOGY REPORT*  Clinical Data:  Vertigo.  MRI BRAIN WITH AND WITHOUT CONTRAST MRA HEAD WITHOUT CONTRAST MRA NECK WITHOUT AND WITH CONTRAST  Technique: Multiplanar, multiecho pulse sequences of the brain and surrounding structures were obtained according to standard protocol with and without intravenous contrast.  Angiographic images of the Circle of Willis were obtained using MRA technique without intravenous contrast.  Angiographic images of the neck were obtained using MRA technique without and with intravenous contrast.  Contrast:10 ml MultiHance.  Comparison:12/20/2011 CT.  11/20/2010 MR.  11/13/2009 MR and MR angiogram circle of Willis.  MRI HEAD  Findings: No acute infarct.  No intracranial hemorrhage.  Very mild small vessel disease type changes.  Mild global atrophy without hydrocephalus.  Left parafalcine 1.1 cm meningioma without surrounding vasogenic edema unchanged.  No other intracranial mass or abnormal enhancement.  Prominent cervical spondylotic changes with spinal stenosis and cord flattening C3-4, C4-5 C5-6.  Transverse ligament hypertrophy.  Partially empty sella.  Mild mucosal thickening/partial opacification ethmoid sinus air cells.  IMPRESSION: No acute infarct.  Left parafalcine 1.1 cm meningioma without surrounding vasogenic edema unchanged.   Prominent cervical spondylotic changes with spinal stenosis and cord flattening C3-4, C4-5 and C5-6.  Transverse ligament hypertrophy.  Mild mucosal thickening/partial opacification ethmoid sinus air cells.  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  Mild irregularity narrowing A1 segment anterior cerebral artery bilaterally.  Minimal irregularity M1 segment middle cerebral artery bilaterally.  Marked tandem stenosis A2 segment right anterior cerebral artery.  Moderate narrowing of the middle cerebral artery branch vessels bilaterally.  Left vertebral artery is dominant.  Mild to moderate narrowing involving portions of the visualized distal vertebral arteries bilaterally more notable on the right.  Nonvisualization left PICA.  Ectatic basilar artery with moderate narrowing distally.  Moderate superior cerebellar artery irregularity bilaterally.  Moderate to marked narrowing proximal aspect posterior cerebral artery greater on the right.  Moderate to marked tandem stenosis distal branches of the posterior cerebral artery bilaterally.  No aneurysm or vascular malformation.  IMPRESSION: Prominent intracranial atherosclerotic type changes as detailed above.  Cough  MRA NECK  Findings:Left vertebral artery arises directly from the aortic arch with proximal mild to slightly moderate narrowing.  Ectatic proximal right vertebral artery with mild narrowing.  Left vertebral artery is dominant.  Slight irregularity cervical segment of the vertebral arteries bilaterally.  Ectatic right common carotid artery.  Mild smooth narrowing proximal right internal carotid artery.  2 cm beyond its origin there is a fold and narrowing secondary to ectasia.  Ectatic left common carotid artery.  Narrowing proximal left internal carotid artery with 50% diameter stenosis.  Left internal carotid artery is ectatic beyond this region without high-grade narrowing.  IMPRESSION: Ectatic common carotid arteries and  proximal internal carotid arteries without hemodynamically significant stenosis of either carotid bifurcation as noted above.  Left vertebral artery arises directly from the aortic arch with mild to slightly moderate narrowing.  Ectatic proximal right vertebral artery with mild narrowing.  Mild irregularity of the mid vertical cervical segment of the vertebral arteries bilaterally.   Original Report Authenticated By: Lacy Duverney, M.D.    Mr Angiogram Neck W Wo Contrast  06/24/2012  *RADIOLOGY REPORT*  Clinical Data:  Vertigo.  MRI BRAIN WITH AND WITHOUT CONTRAST MRA HEAD WITHOUT CONTRAST MRA NECK WITHOUT AND WITH CONTRAST  Technique: Multiplanar, multiecho pulse sequences of the brain and surrounding structures were obtained according to standard protocol with and without intravenous contrast.  Angiographic images of the Circle of Willis were obtained using MRA technique  without intravenous contrast.  Angiographic images of the neck were obtained using MRA technique without and with intravenous contrast.  Contrast:10 ml MultiHance.  Comparison:12/20/2011 CT.  11/20/2010 MR.  11/13/2009 MR and MR angiogram circle of Willis.  MRI HEAD  Findings: No acute infarct.  No intracranial hemorrhage.  Very mild small vessel disease type changes.  Mild global atrophy without hydrocephalus.  Left parafalcine 1.1 cm meningioma without surrounding vasogenic edema unchanged.  No other intracranial mass or abnormal enhancement.  Prominent cervical spondylotic changes with spinal stenosis and cord flattening C3-4, C4-5 C5-6.  Transverse ligament hypertrophy.  Partially empty sella.  Mild mucosal thickening/partial opacification ethmoid sinus air cells.  IMPRESSION: No acute infarct.  Left parafalcine 1.1 cm meningioma without surrounding vasogenic edema unchanged.  Prominent cervical spondylotic changes with spinal stenosis and cord flattening C3-4, C4-5 and C5-6.  Transverse ligament hypertrophy.  Mild mucosal thickening/partial  opacification ethmoid sinus air cells.  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  Mild irregularity narrowing A1 segment anterior cerebral artery bilaterally.  Minimal irregularity M1 segment middle cerebral artery bilaterally.  Marked tandem stenosis A2 segment right anterior cerebral artery.  Moderate narrowing of the middle cerebral artery branch vessels bilaterally.  Left vertebral artery is dominant.  Mild to moderate narrowing involving portions of the visualized distal vertebral arteries bilaterally more notable on the right.  Nonvisualization left PICA.  Ectatic basilar artery with moderate narrowing distally.  Moderate superior cerebellar artery irregularity bilaterally.  Moderate to marked narrowing proximal aspect posterior cerebral artery greater on the right.  Moderate to marked tandem stenosis distal branches of the posterior cerebral artery bilaterally.  No aneurysm or vascular malformation.  IMPRESSION: Prominent intracranial atherosclerotic type changes as detailed above.  Cough  MRA NECK  Findings:Left vertebral artery arises directly from the aortic arch with proximal mild to slightly moderate narrowing.  Ectatic proximal right vertebral artery with mild narrowing.  Left vertebral artery is dominant.  Slight irregularity cervical segment of the vertebral arteries bilaterally.  Ectatic right common carotid artery.  Mild smooth narrowing proximal right internal carotid artery.  2 cm beyond its origin there is a fold and narrowing secondary to ectasia.  Ectatic left common carotid artery.  Narrowing proximal left internal carotid artery with 50% diameter stenosis.  Left internal carotid artery is ectatic beyond this region without high-grade narrowing.  IMPRESSION: Ectatic common carotid arteries and proximal internal carotid arteries without hemodynamically significant stenosis of either carotid bifurcation as noted above.  Left vertebral artery arises directly from  the aortic arch with mild to slightly moderate narrowing.  Ectatic proximal right vertebral artery with mild narrowing.  Mild irregularity of the mid vertical cervical segment of the vertebral arteries bilaterally.   Original Report Authenticated By: Lacy Duverney, M.D.    Mr Laqueta Jean ZO Contrast  06/24/2012  *RADIOLOGY REPORT*  Clinical Data:  Vertigo.  MRI BRAIN WITH AND WITHOUT CONTRAST MRA HEAD WITHOUT CONTRAST MRA NECK WITHOUT AND WITH CONTRAST  Technique: Multiplanar, multiecho pulse sequences of the brain and surrounding structures were obtained according to standard protocol with and without intravenous contrast.  Angiographic images of the Circle of Willis were obtained using MRA technique without intravenous contrast.  Angiographic images of the neck were obtained using MRA technique without and with intravenous contrast.  Contrast:10 ml MultiHance.  Comparison:12/20/2011 CT.  11/20/2010 MR.  11/13/2009 MR and MR angiogram circle of Willis.  MRI HEAD  Findings: No acute infarct.  No intracranial hemorrhage.  Very mild  small vessel disease type changes.  Mild global atrophy without hydrocephalus.  Left parafalcine 1.1 cm meningioma without surrounding vasogenic edema unchanged.  No other intracranial mass or abnormal enhancement.  Prominent cervical spondylotic changes with spinal stenosis and cord flattening C3-4, C4-5 C5-6.  Transverse ligament hypertrophy.  Partially empty sella.  Mild mucosal thickening/partial opacification ethmoid sinus air cells.  IMPRESSION: No acute infarct.  Left parafalcine 1.1 cm meningioma without surrounding vasogenic edema unchanged.  Prominent cervical spondylotic changes with spinal stenosis and cord flattening C3-4, C4-5 and C5-6.  Transverse ligament hypertrophy.  Mild mucosal thickening/partial opacification ethmoid sinus air cells.  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  Mild irregularity narrowing A1 segment anterior cerebral  artery bilaterally.  Minimal irregularity M1 segment middle cerebral artery bilaterally.  Marked tandem stenosis A2 segment right anterior cerebral artery.  Moderate narrowing of the middle cerebral artery branch vessels bilaterally.  Left vertebral artery is dominant.  Mild to moderate narrowing involving portions of the visualized distal vertebral arteries bilaterally more notable on the right.  Nonvisualization left PICA.  Ectatic basilar artery with moderate narrowing distally.  Moderate superior cerebellar artery irregularity bilaterally.  Moderate to marked narrowing proximal aspect posterior cerebral artery greater on the right.  Moderate to marked tandem stenosis distal branches of the posterior cerebral artery bilaterally.  No aneurysm or vascular malformation.  IMPRESSION: Prominent intracranial atherosclerotic type changes as detailed above.  Cough  MRA NECK  Findings:Left vertebral artery arises directly from the aortic arch with proximal mild to slightly moderate narrowing.  Ectatic proximal right vertebral artery with mild narrowing.  Left vertebral artery is dominant.  Slight irregularity cervical segment of the vertebral arteries bilaterally.  Ectatic right common carotid artery.  Mild smooth narrowing proximal right internal carotid artery.  2 cm beyond its origin there is a fold and narrowing secondary to ectasia.  Ectatic left common carotid artery.  Narrowing proximal left internal carotid artery with 50% diameter stenosis.  Left internal carotid artery is ectatic beyond this region without high-grade narrowing.  IMPRESSION: Ectatic common carotid arteries and proximal internal carotid arteries without hemodynamically significant stenosis of either carotid bifurcation as noted above.  Left vertebral artery arises directly from the aortic arch with mild to slightly moderate narrowing.  Ectatic proximal right vertebral artery with mild narrowing.  Mild irregularity of the mid vertical cervical  segment of the vertebral arteries bilaterally.   Original Report Authenticated By: Lacy Duverney, M.D.     Microbiology: No results found for this or any previous visit (from the past 240 hour(s)).   Labs: Basic Metabolic Panel:  Lab 06/24/12 4098 06/24/12 1506  NA -- 137  K -- 4.2  CL -- 99  CO2 -- 31  GLUCOSE -- 109*  BUN -- 21  CREATININE 0.79 0.74  CALCIUM -- 9.0  MG -- --  PHOS -- --   Liver Function Tests:  Lab 06/24/12 1506  AST 24  ALT 25  ALKPHOS 110  BILITOT 0.4  PROT 6.3  ALBUMIN 3.3*   No results found for this basename: LIPASE:5,AMYLASE:5 in the last 168 hours No results found for this basename: AMMONIA:5 in the last 168 hours CBC:  Lab 06/24/12 2146 06/24/12 1506  WBC 8.6 7.9  NEUTROABS -- 6.0  HGB 10.4* 10.3*  HCT 31.5* 31.4*  MCV 90.8 91.0  PLT 247 247   Cardiac Enzymes:  Lab 06/24/12 1506  CKTOTAL --  CKMB --  CKMBINDEX --  TROPONINI <0.30  BNP: BNP (last 3 results) No results found for this basename: PROBNP:3 in the last 8760 hours CBG:  Lab 06/26/12 0742 06/25/12 0744  GLUCAP 103* 112*       Signed:  Mishawn Didion  [facility] 06/26/2012, 6:37 PM

## 2012-06-26 NOTE — Progress Notes (Signed)
PT Cancellation Note  Patient Details Name: Shelly Martin MRN: [id number] DOB: Apr 05, 1919   Cancelled Treatment:    Reason Eval/Treat Not Completed:  (Refused as she reports she is no longer dizzy and wants to rest for d/c home. )  Thanks. [facility] [phone] (pager)     INGOLD,Amaka Gluth 06/26/2012, 12:41 PM

## 2012-06-26 NOTE — Evaluation (Signed)
Occupational Therapy Evaluation Patient Details Name: Shelly Martin MRN: [id number] DOB: Jul 04, 1918 Today's Date: 06/26/2012 Time: 7829-5621 OT Time Calculation (min): 31 min  OT Assessment / Plan / Recommendation Clinical Impression  This 79 year old female was admitted with vertigo.  PT saw her yesterday and found BPPV for which they performed Epley.  Pt did not have dizziness during OT eval.  Pt has R hip pain from back and this impacts adls.  She wants to be as active as possible and also likes to do things the way she has, as long as she can continue. Recommend continued OT    OT Assessment  Patient needs continued OT Services    Follow Up Recommendations  Home health OT    Barriers to Discharge      Equipment Recommendations  None recommended by OT (pt does not want 3:1)    Recommendations for Other Services    Frequency  Min 2X/week    Precautions / Restrictions Precautions Precautions: Fall Restrictions Weight Bearing Restrictions: No   Pertinent Vitals/Pain Pain R hip easing off but moderate--premedicated and repositioned    ADL  Lower Body Bathing: Simulated;Minimal assistance Where Assessed - Lower Body Bathing: Supported sit to stand Lower Body Dressing: Performed;Minimal assistance Where Assessed - Lower Body Dressing: Supported sit to stand Toilet Transfer: Mining engineer Method: Sit to stand Toileting - Architect and Hygiene: Simulated;Minimal assistance Where Assessed - Engineer, mining and Hygiene: Sit to stand from 3-in-1 or toilet Equipment Used: Sock aid Transfers/Ambulation Related to ADLs: hand held assist walking to sink. Pt uses a SPC and furniture holds at home. She does not like the idea of a walker. Feels space is too small.  Showed side stepping but she prefers her way ADL Comments: Pt is very pleasant but set in her ways.  Showed sock aid for socks--she can normally cross RLE with  assist from hands--limited by pain this pm.  She did not like sock aid.  She performs IADLs such as laundry and unloading dishes/putting them away.  She loves to be active and just recently gave up driving.  Pt did not have dizziness during eval:  noted Rocky Link, PT performed Epley maneuver earlier this week.  Also spoke to pt about using reacher (which she uses for IADLs) for  LB dressing--she feels like her current method will work OK.      OT Diagnosis: Generalized weakness  OT Problem List: Impaired balance (sitting and/or standing);Pain;Decreased strength;Decreased activity tolerance;Decreased knowledge of use of DME or AE OT Treatment Interventions: Self-care/ADL training;DME and/or AE instruction;Patient/family education;Balance training   OT Goals Acute Rehab OT Goals OT Goal Formulation: With patient Time For Goal Achievement: 07/03/12 Potential to Achieve Goals: Good ADL Goals Pt Will Perform Lower Body Bathing: with supervision;Sit to stand from chair ADL Goal: Lower Body Bathing - Progress: Goal set today Pt Will Perform Lower Body Dressing: with supervision;Sit to stand from chair ADL Goal: Lower Body Dressing - Progress: Goal set today Pt Will Transfer to Toilet: with supervision;Ambulation;Comfort height toilet (using SPC and perform hygiene) ADL Goal: Toilet Transfer - Progress: Goal set today  Visit Information  Last OT Received On: 06/26/12 Assistance Needed: +1 Reason Eval/Treat Not Completed: Other (comment) (noted, PT has checked with pt. She is no longer dizzy )    Subjective Data  Subjective: I've been having this pain for awhile Patient Stated Goal: go home and do the best she can--she can have friends come help if she needs  anything when daughter is working   Prior [employer] Living Lives With: Daughter Available Help at Discharge: Family;Available PRN/intermittently Bathroom Shower/Tub: Walk-in shower (grab bar) Bathroom Toilet: Handicapped height  (might be DME on top--unclear to me) Home Adaptive Equipment: Walker - rolling;Straight cane Prior Function Level of Independence: Independent with assistive device(s) Communication Communication: No difficulties         Vision/Perception     Cognition  Overall Cognitive Status: Appears within functional limits for tasks assessed/performed Arousal/Alertness: Awake/alert Orientation Level: Oriented X4 / Intact Behavior During Session: [facility] for tasks performed    Extremity/Trunk Assessment Right Upper Extremity Assessment RUE ROM/Strength/Tone: Within functional levels Left Upper Extremity Assessment LUE ROM/Strength/Tone: Within functional levels     Mobility Bed Mobility Left Sidelying to Sit: 4: Min guard;With rails Sit to Sidelying Left: 4: Min assist Transfers Sit to Stand: 4: Min assist;From bed Details for Transfer Assistance: pt preferred hand held assist; did not want to use RW     Shoulder Instructions     Exercise     Balance     End of Session OT - End of Session Activity Tolerance: Patient tolerated treatment well Patient left: in bed;with call bell/phone within reach  GO     Carline Dura 06/26/2012, 4:53 PM Marica Otter, OTR/L [phone] 06/26/2012

## 2012-06-27 NOTE — Progress Notes (Signed)
Late entry 06/26/2012- NCM spoke to pt and states she has [facility]. Contacted [facility] and faxed orders, dc summary, and facesheet to agency. Isidoro Donning RN CCM Case Mgmt phone [phone]

## 2012-06-27 NOTE — Progress Notes (Signed)
NCM spoke to on call RN Mickel Crow. States pt was active with [facility] RN also. Will fax orders to [facility] for Resumption of Care. Isidoro Donning RN CCM Case Mgmt phone [phone]

## 2012-06-28 ENCOUNTER — Other Ambulatory Visit: Payer: Self-pay | Admitting: *Deleted

## 2012-06-28 MED ORDER — LOSARTAN POTASSIUM 50 MG PO TABS: 50.0000 mg | ORAL_TABLET | Freq: Two times a day (BID) | ORAL | Status: DC

## 2012-06-28 NOTE — Telephone Encounter (Signed)
Refill request

## 2012-06-29 ENCOUNTER — Ambulatory Visit (INDEPENDENT_AMBULATORY_CARE_PROVIDER_SITE_OTHER): Payer: Medicare Other | Admitting: Family Medicine

## 2012-06-29 ENCOUNTER — Ambulatory Visit: Payer: Medicare Other

## 2012-06-29 VITALS — BP 123/67 | HR 103 | Temp 98.9°F | Resp 17 | Ht 60.0 in | Wt 126.0 lb

## 2012-06-29 DIAGNOSIS — M25569 Pain in unspecified knee: Secondary | ICD-10-CM

## 2012-06-29 NOTE — Patient Instructions (Addendum)
Thank you for coming in today. You have bad arthritis in your knee.  I think your twist made it worse.  I hope the injection will help.  If you do not get better please follow up with Dr. Farris Has at [facility] [address] as needed.  Take 1 tylenol in addition to your oxycodone as needed for pain.

## 2012-06-29 NOTE — Progress Notes (Signed)
Shelly Martin is a 79 y.o. female who presents to [facility] today for left knee pain. Patient fell backwards onto her bed this evening twisting her right knee.  She notes pain in the medial aspect of her knee associated with swelling. She has difficulty with walking secondary to pain. She denies any radiating pain weakness or numbness. She has not tried any pain medications. She denies any fevers or chills or history significant knee injury in the past.   PMH: Reviewed significant for TIA, vertigo, spinal stenosis History  Substance Use Topics  . Smoking status: Never Smoker   . Smokeless tobacco: Never Used  . Alcohol Use: No     Comment: rarely   ROS as above  Medications reviewed. Current Outpatient Prescriptions  Medication Sig Dispense Refill  . gabapentin (NEURONTIN) 100 MG capsule Take 2 capsules (200 mg total) by mouth 4 (four) times daily.  120 capsule  0  . ibuprofen (ADVIL) 400 MG tablet Take 1 tablet (400 mg total) by mouth every 6 (six) hours as needed for pain.  30 tablet  0  . losartan (COZAAR) 50 MG tablet Take 1 tablet (50 mg total) by mouth 2 (two) times daily.  180 tablet  0  . meclizine (ANTIVERT) 25 MG tablet Take 1 tablet (25 mg total) by mouth 3 (three) times daily as needed for dizziness.  30 tablet  0  . oxyCODONE-acetaminophen (PERCOCET) 5-325 MG per tablet Take 1 tablet by mouth 3 (three) times daily.  50 tablet  0  . rOPINIRole (REQUIP) 2 MG tablet Take 2 mg by mouth 2 (two) times daily.        Exam:  BP 123/67  Pulse 103  Temp 98.9 F (37.2 C) (Oral)  Resp 17  Ht 5' (1.524 m)  Wt 126 lb (57.153 kg)  BMI 24.61 kg/m2  SpO2 95% Gen: Well NAD HEENT: EOMI,  MMM Lungs: CTABL Nl WOB Heart: RRR no MRG Exts: Non edematous BL  LE, warm and well perfused.  Right knee: Mild effusion present. Range of motion 0-110 1+ retropatellar crepitations to extension Tender to palpation over the medial joint line Positive medial McMurray's test Negative valgus varus  stress Negative Lachman's  Right knee three-view x-ray. Severe end-stage lateral and patellofemoral compartment DJD and moderate to severe medial compartment DJD. No fractures  Knee injection. Consent obtained and timeout performed. Patient seated in a comfortable position with legs hanging off the table.  The medial Peri-patellar tendon space was palpated and marked. The skin was then cleaned with alcohol. Cold spray applied. A 25-gauge inch and a half needle was used to inject 40 mg of Depo-Medrol and 4 mL of 0.5% Marcaine. Patient tolerated procedure well no bleeding. Pain improved following injection    Assessment and Plan: 78 y.o. female with right knee pain following twisting injury. Pain medial joint line possibly degenerative meniscus tear. Patient has severe end-stage bone-on-bone DJD. This may be an exacerbation of her chronic knee DJD. Plan for corticosteroid injection today for attempt at therapeutic relief.  Additional increase Tylenol in the interim.  If not improved followup with orthopedics as needed.  Recommended using a walker as needed for the next several days.

## 2012-06-30 ENCOUNTER — Ambulatory Visit: Payer: Medicare Other | Admitting: Physical Medicine and Rehabilitation

## 2012-07-01 NOTE — Progress Notes (Addendum)
Xray read and patient discussed with Dr. Denyse Amass. Agree with assessment and plan of care per his note.  Radiologist report noted: IMPRESSION: Tricompartmental degenerative changes demonstrating progression  since the previous study. No acute fractures.

## 2012-07-05 ENCOUNTER — Encounter: Payer: Self-pay | Admitting: Internal Medicine

## 2012-07-05 ENCOUNTER — Telehealth: Payer: Self-pay | Admitting: *Deleted

## 2012-07-05 ENCOUNTER — Ambulatory Visit (INDEPENDENT_AMBULATORY_CARE_PROVIDER_SITE_OTHER): Payer: Medicare Other | Admitting: Internal Medicine

## 2012-07-05 VITALS — BP 136/64 | HR 79 | Temp 97.8°F | Resp 18 | Wt 124.0 lb

## 2012-07-05 DIAGNOSIS — M25551 Pain in right hip: Secondary | ICD-10-CM | POA: Insufficient documentation

## 2012-07-05 DIAGNOSIS — G8929 Other chronic pain: Secondary | ICD-10-CM

## 2012-07-05 DIAGNOSIS — M549 Dorsalgia, unspecified: Secondary | ICD-10-CM

## 2012-07-05 DIAGNOSIS — M25559 Pain in unspecified hip: Secondary | ICD-10-CM

## 2012-07-05 DIAGNOSIS — M48061 Spinal stenosis, lumbar region without neurogenic claudication: Secondary | ICD-10-CM

## 2012-07-05 DIAGNOSIS — K5900 Constipation, unspecified: Secondary | ICD-10-CM

## 2012-07-05 MED ORDER — ROPINIROLE HCL 2 MG PO TABS: ORAL_TABLET | ORAL | Status: DC

## 2012-07-05 MED ORDER — OXYCODONE-ACETAMINOPHEN 7.5-500 MG PO TABS: ORAL_TABLET | ORAL | Status: DC

## 2012-07-05 NOTE — Telephone Encounter (Signed)
[facility] home [facility] and give order for social worker to do assessment for home health aide or sitter - whatever they can assist her with

## 2012-07-05 NOTE — Telephone Encounter (Signed)
Verified requip dosage with pharmacy

## 2012-07-05 NOTE — Telephone Encounter (Signed)
Pt called this am for appt

## 2012-07-05 NOTE — Progress Notes (Signed)
Subjective:    Patient ID: Shelly Martin, female    DOB: [birth year], 79 y.o.   MRN: [id number]  HPI  Shelly Martin is here for acute visit.  She is accompanied by her daughter Shelly Martin.     Shelly Martin has been hospitalized since last visit with what was felt to be TIA symptoms with neg CT and MRI for acute CVA.   She has small vessel disease and a 1.1 cm meningioma that is reportedly unchanged.    Shelly Martin also has signficant issues with pain control while in hospital.   Discharge dx was vertigo due to labrynthitis vs BPPV.  Since hospital discharge she twisted her  R knee getting out of bed.  Seen at Urgent care and neg for fx  Pt reports her dizziness is better and knee is improving but she still has significant pain.   She has upcoming appt with Shelly Martin but is expecting significant snow so came here today.    Hospital discharge instructed pt. To take gabapentin 200 mg qid but pt states she has only been taking tid at home.  She also has oxycodone 5/325 mg which pt states she is taking tid per Shelly Martin orders.    Long discussion regarding assistance  at home because her daughter works and pt reports she does not wish to transition to assisted living.   Allergies  Allergen Reactions  . Amoxicillin   . Baclofen Other (See Comments)    "fuzzy in the eyes" and dizzy  . Hctz (Hydrochlorothiazide)     nausea  . Valium (Diazepam) Other (See Comments)    Pt became unresponsive and O2 sats dropped.   Past Medical History  Diagnosis Date  . Hypertension   . Restless legs syndrome     on Requip  . Ulcer causing bleeding and hole in wall of stomach or small intestine   . Gastric ulcer     years ago  . Arthritis     Back, knees  . Constipation   . Urinary bladder calculus   . Urinary incontinence   . Bowel incontinence   . Heart murmur   . Stroke     Mini, no residual   Past Surgical History  Procedure Laterality Date  . Colon resection  50 years ago  . Tonsillectomy    . Uterine  fibroid surgery    . Lumbar laminectomy/decompression microdiscectomy  06/25/2011    Procedure: LUMBAR LAMINECTOMY/DECOMPRESSION MICRODISCECTOMY;  Surgeon: Hewitt Shorts, MD;  Location: [facility];  Service: Neurosurgery;  Laterality: Right;  RIGHT Lumbar Two-Three hemilaminectomy and microdiskectomy  . Vaginal hysterectomy     History   Social History  . Marital Status: Widowed    Spouse Name: N/A    Number of Children: 2  . Years of Education: N/A   Occupational History  . Retired    Social History Main Topics  . Smoking status: Never Smoker   . Smokeless tobacco: Never Used  . Alcohol Use: No     Comment: rarely  . Drug Use: No  . Sexually Active: Not Currently   Other Topics Concern  . Not on file   Social History Narrative  . No narrative on file   Family History  Problem Relation Age of Onset  . Anesthesia problems Neg Hx   . Pancreatic cancer Brother   . Dementia Mother   . Stroke Father    Patient Active Problem List  Diagnosis  . Unspecified constipation  . Urinary incontinence  .  H/O: CVA (cardiovascular accident)  . Hypertension  . Restless legs syndrome  . Personal history of colonic polyps  . Lower back pain  . History of TIAs  . S/P total hysterectomy and bilateral salpingo-oophorectomy  . Hard of hearing  . History of anemia  . RLS (restless legs syndrome)  . Chronic back pain  . Spinal stenosis, lumbar  . DJD (degenerative joint disease) of hip  . Lumbar pain with radiation down right leg  . Vertigo  . Acute exacerbation of chronic low back pain  . Knee pain, acute  . Chronic right hip pain   Current Outpatient Prescriptions on File Prior to Visit  Medication Sig Dispense Refill  . gabapentin (NEURONTIN) 100 MG capsule Take 2 capsules (200 mg total) by mouth 4 (four) times daily.  120 capsule  0  . ibuprofen (ADVIL) 400 MG tablet Take 1 tablet (400 mg total) by mouth every 6 (six) hours as needed for pain.  30 tablet  0  . losartan  (COZAAR) 50 MG tablet Take 1 tablet (50 mg total) by mouth 2 (two) times daily.  180 tablet  0  . meclizine (ANTIVERT) 25 MG tablet Take 1 tablet (25 mg total) by mouth 3 (three) times daily as needed for dizziness.  30 tablet  0   No current facility-administered medications on file prior to visit.      Review of Systems See HPI    Objective:   Physical Exam Physical Exam  Nursing note and vitals reviewed.  Pt in wheelchair Constitutional: She is oriented to person, place, and time. She appears well-developed and well-nourished.  HENT:  Head: Normocephalic and atraumatic.  Cardiovascular: Normal rate and regular rhythm. Exam reveals no gallop and no friction rub.  No murmur heard.  Pulmonary/Chest: Breath sounds normal. She has no wheezes. She has no rales.  Neurological: She is alert and oriented to person, place, and time.  Skin: Skin is warm and dry.  Psychiatric: She has a normal mood and affect. Her behavior is normal.         Assessment & Plan:  Chronic hip and back pain  :  Will increase oxycodone to 7.5mg /500 tid per Dr. Jerrilyn Cairo direction Long discussion regarding management at home .  Pt repeatedly refuses  Assisted living.  Will have [facility] do social work assessment for home health aide.    Constipation Duclolax suppository qod.    Lumbar and cervical stenosis  Meningioma:  I spoke with Shelly Martin, Shelly Martin secretary.  Will fax Mri report to office and she will show this to Shelly Martin her neurosurgeon.    HTN:  Adequate control

## 2012-07-05 NOTE — Patient Instructions (Addendum)
Take oxycodone 7.5/500 mg 3 times a day 7:30 am,  1:30 pm and 7:30 pm  Call Dr. Jerrilyn Cairo office for further pain control  Use suppository for constipation every other day.  Hold one time only for diarrhea  Take gabapentin  200 mg four times a day  Will have social work do Armed forces logistics/support/administrative officer

## 2012-07-05 NOTE — Telephone Encounter (Signed)
[facility] contacted regarding sitter service was informed that they do not provide this service. Will contact several local agencies

## 2012-07-06 ENCOUNTER — Other Ambulatory Visit: Payer: Self-pay | Admitting: Internal Medicine

## 2012-07-07 ENCOUNTER — Telehealth: Payer: Self-pay | Admitting: Physical Medicine and Rehabilitation

## 2012-07-07 ENCOUNTER — Encounter: Payer: Medicare Other | Admitting: Physical Medicine and Rehabilitation

## 2012-07-07 NOTE — Telephone Encounter (Signed)
Spoke with Dr.Schoenhoff regarding Shelly Martin. Shelly Martin was unable to make it to the last couple of clinic appointments due to inclimate weather, including todays appt. Dr. Constance Goltz filled her last pain medication prescription. She also updated me regarding her recent hospitalization as well as MRI and CT studies.  She has concerns regarding this patient's ability and safety to remain in her home independently.  She has been working with social workers.  I expressed concern regarding my ability to monitor her narcotic pain medications appropriately if she does not come for her clinic appointments.  I will make sure she is scheduled for another appointment with me.

## 2012-07-12 ENCOUNTER — Telehealth: Payer: Self-pay | Admitting: Internal Medicine

## 2012-07-12 ENCOUNTER — Other Ambulatory Visit: Payer: Self-pay | Admitting: Internal Medicine

## 2012-07-12 NOTE — Telephone Encounter (Signed)
Returned pt phone call spoke with pt's daughter who states that pt condition is worsening. She is in a lot of pain and can not walk.

## 2012-07-12 NOTE — Telephone Encounter (Signed)
Pt called in on 02.13.14 at 522 pm.. Stating she is in pain around the groin.. She states the meds DDS prescribed is potent... PER pt she was not able to make her appointment with another doctor because she was not able to wake up bc of the meds... PLEASE CALL PT... [phone].Marland KitchenMarland Kitchen

## 2012-07-12 NOTE — Telephone Encounter (Signed)
Awaiting return call from office

## 2012-07-12 NOTE — Telephone Encounter (Signed)
Spoke with Metallurgist at [employer]    She is to discuss with pt and family options of home assistance versus transitioning to SNF

## 2012-07-13 ENCOUNTER — Telehealth: Payer: Self-pay

## 2012-07-13 NOTE — Telephone Encounter (Signed)
Refill request

## 2012-07-13 NOTE — Telephone Encounter (Signed)
Patients PCP called with questions.  Tried calling twice.  Had to leave a message.

## 2012-07-16 ENCOUNTER — Other Ambulatory Visit: Payer: Self-pay | Admitting: *Deleted

## 2012-07-16 ENCOUNTER — Other Ambulatory Visit: Payer: Self-pay | Admitting: Internal Medicine

## 2012-07-16 MED ORDER — GABAPENTIN 100 MG PO CAPS: 200.0000 mg | ORAL_CAPSULE | Freq: Four times a day (QID) | ORAL | Status: DC

## 2012-07-16 NOTE — Telephone Encounter (Signed)
Home Health RN called to notify us that pt has run out of Neurontin. Pt ws prescribed this while in the hospital and will not refill it, and it needs to be managed by PCP

## 2012-07-16 NOTE — Telephone Encounter (Signed)
Shelly Martin  You can call pt. And let her know that I refilled her Neurontin.  Counsel her to be sure to keep appt next week with Dr. Pamelia Hoit and take all her pill bottle with her to the office

## 2012-07-19 NOTE — Telephone Encounter (Signed)
Left message reminding pt of appt on Wed

## 2012-07-21 ENCOUNTER — Encounter: Payer: Self-pay | Admitting: Physical Medicine and Rehabilitation

## 2012-07-21 ENCOUNTER — Encounter
Payer: Medicare Other | Attending: Physical Medicine and Rehabilitation | Admitting: Physical Medicine and Rehabilitation

## 2012-07-21 VITALS — BP 120/57 | HR 73 | Resp 14 | Ht 60.0 in | Wt 123.4 lb

## 2012-07-21 DIAGNOSIS — G8929 Other chronic pain: Secondary | ICD-10-CM

## 2012-07-21 DIAGNOSIS — M549 Dorsalgia, unspecified: Secondary | ICD-10-CM | POA: Insufficient documentation

## 2012-07-21 DIAGNOSIS — M25559 Pain in unspecified hip: Secondary | ICD-10-CM

## 2012-07-21 DIAGNOSIS — Z5181 Encounter for therapeutic drug level monitoring: Secondary | ICD-10-CM | POA: Insufficient documentation

## 2012-07-21 DIAGNOSIS — B20 Human immunodeficiency virus [HIV] disease: Secondary | ICD-10-CM

## 2012-07-21 MED ORDER — DICLOFENAC SODIUM 1 % TD GEL: 2.0000 g | Freq: Four times a day (QID) | TRANSDERMAL | Status: DC

## 2012-07-21 MED ORDER — OXYCODONE-ACETAMINOPHEN 7.5-325 MG PO TABS: 1.0000 | ORAL_TABLET | Freq: Four times a day (QID) | ORAL | Status: DC

## 2012-07-21 NOTE — Patient Instructions (Addendum)
Please take your pain medications as prescribed  Please write down when you take your medicine, which medicine you're taking, and your pain score 1-10.  I have refilled your pain medication for you.  I have also giving you a prescription for Voltaren gel to put on your right knee.  Followup in 2 weeks for monitoring.

## 2012-07-21 NOTE — Progress Notes (Signed)
Subjective:    Patient ID: Shelly Martin, female    DOB: [birth year], 79 y.o.   MRN: [id number]  HPI  79 y/o woman with multilevel thoracolumbar spondylosis and stenosis, s/p L 3 laminectiomy and microdiscectomy per Dr. Kizzie Ide 06/25/11.  She is here today with complaints of continued chronic low back pain especially on the right, right hip and leg pain.  She has recently been hospitalized with but was felt to be a TIA underwent a workup negative CT and MRI for acute CVA. 1.1 cm menigioma was noted and is apparently stable per previous reports.  Patient also had pain control issues while hospitalized and also had about diagnosis of vertigo. She had a recent injury to her knee which is improving.  She comes in today with her notes indicating when she takes her pain medication.  She tells me the pain medication does help somewhat but does not seem to be lasting especially in the late morning and late afternoon.  She is taking it at 8:30 AM, 1:30 PM and 7:30 PM.  Pain Inventory Average Pain 10 Pain Right Now 9 My pain is sharp, burning, dull, stabbing, tingling and aching  In the last 24 hours, has pain interfered with the following? General activity 1 Relation with others 0 Enjoyment of life 2 What TIME of day is your pain at its worst? morning Sleep (in general) Fair  Pain is worse with: walking, bending, sitting and standing Pain improves with: medication Relief from Meds: 3  Mobility walk with assistance use a cane use a walker how many minutes can you walk? 2 ability to climb steps?  no do you drive?  no use a wheelchair  Function not employed: date last employed home I need assistance with the following:  dressing, bathing, meal prep, household duties and shopping  Neuro/Psych bladder control problems bowel control problems tremor dizziness  Prior Studies Any changes since last visit?  no  Physicians involved in your care Any changes since last visit?   no Primary care Schoenhoff   Family History  Problem Relation Age of Onset  . Anesthesia problems Neg Hx   . Pancreatic cancer Brother   . Dementia Mother   . Stroke Father    History   Social History  . Marital Status: Widowed    Spouse Name: N/A    Number of Children: 2  . Years of Education: N/A   Occupational History  . Retired    Social History Main Topics  . Smoking status: Never Smoker   . Smokeless tobacco: Never Used  . Alcohol Use: No     Comment: rarely  . Drug Use: No  . Sexually Active: Not Currently   Other Topics Concern  . None   Social History Narrative  . None   Past Surgical History  Procedure Laterality Date  . Colon resection  50 years ago  . Tonsillectomy    . Uterine fibroid surgery    . Lumbar laminectomy/decompression microdiscectomy  06/25/2011    Procedure: LUMBAR LAMINECTOMY/DECOMPRESSION MICRODISCECTOMY;  Surgeon: Hewitt Shorts, MD;  Location: [facility];  Service: Neurosurgery;  Laterality: Right;  RIGHT Lumbar Two-Three hemilaminectomy and microdiskectomy  . Vaginal hysterectomy     Past Medical History  Diagnosis Date  . Hypertension   . Restless legs syndrome     on Requip  . Ulcer causing bleeding and hole in wall of stomach or small intestine   . Gastric ulcer     years ago  .  Arthritis     Back, knees  . Constipation   . Urinary bladder calculus   . Urinary incontinence   . Bowel incontinence   . Heart murmur   . Stroke     Mini, no residual   BP 120/57  Pulse 73  Resp 14  Ht 5' (1.524 m)  Wt 123 lb 6.4 oz (55.974 kg)  BMI 24.1 kg/m2  SpO2 95%    Review of Systems  Constitutional: Positive for unexpected weight change.  Cardiovascular: Positive for leg swelling.  Gastrointestinal: Positive for nausea, diarrhea and constipation.       Bowel Control Problems  Genitourinary:       Bladder control problems  Musculoskeletal: Positive for back pain.  Neurological: Positive for dizziness and tremors.        Objective:   Physical Exam The patient is an elderly female in no apparent distress.  She is alert oriented x3 cooperative and pleasant. She follows commands without difficulty and answers questions (some discrepancy in answer between patient and her daughter at times)  Cranial nerves are notable for diminished hearing.   Coordination is grossly intact  Reflexes are 1+ in the upper extremities and diminished in both lower extremities. There is no abnormal tone or clonus or tremors noted  Light touch Sensation is diminished over the right distal medial thigh as well as in the right foot.  Manual muscle testing reveals 5 over 5 strength in the left lower extremity. Right lower extremity hip flexion is diminished 2/5 however she reports significant pain with hip flexion and maybe unable to give full effort secondary to pain. The rest of the right lower extremity is 5 over 5 including knee extensors knee flexors dorsiflexors plantar flexors and EHL.  Transitioning from sitting to standing increases her right low back and right posterior and lateral hip pain.  She has limited lumbar motion  Internal and external rotation at the hips does not provoke pain however right hip flexion significantly provoke pain  .  Diffuse tenderness noted and lumbar paraspinal musculature as well as gluteal hip musculature.  Well healed midline lumbar scar noted  Gait is notable for shorter stride length, decreased toe off. Romberg test is performed adequately.  Some mild difficulty noted with tandem gait.         Assessment & Plan:  1. Chronic Low back pain and severe right lower extremity pain secondary to severe lumbar spinal stenosis.  2. Right hip OA: Right hip injection did not accurately improve pain.   The patient is currently taking 7.5/325 oxycodone 3 times a day.  She is also taking gabapentin 200 mg 3 times a day.  She is having most of her difficulty late in the morning and late in the  afternoon.  At this time she and her daughter do not report problems with over sedation. She continues to have problems with constipation (I asked her to followup with her GI specialist to primary care for this)   Today we will add an additional dose of oxycodone as well as gabapentin.  She will take oxycodone 7.5/325 4 times a day  As well as gabapentin 200 mg 1 per os 4 times a day  Will also add Voltaren gel to apply to right knee 4 times a day.  She will continue to document when she takes her pain medication and her pain scores.  Risks and benefits of pain medication reviewed.  She will bring in her pain medication to each visit  for drug monitoring. In the last month due to inclement weather she was unable to come to our clinic and her pain medication had to be managed by her primary care physician.  UDS done today  She reports overall from 10 PM to 6:30 AM she sleeps quite well.  F/u in 2 weeks for monitering

## 2012-07-22 ENCOUNTER — Telehealth: Payer: Self-pay | Admitting: Internal Medicine

## 2012-07-22 ENCOUNTER — Telehealth: Payer: Self-pay | Admitting: *Deleted

## 2012-07-22 NOTE — Telephone Encounter (Signed)
Returned Shelly Martin's phone call states that she has already spoke with Dr Constance Goltz regarding Pt

## 2012-07-22 NOTE — Telephone Encounter (Signed)
Spoke with daughter.  Pt may qualify for WW[employer] survivors benefit that can be used for assisted living or in home care.  She is to drop off forms

## 2012-07-27 ENCOUNTER — Telehealth: Payer: Self-pay

## 2012-07-27 ENCOUNTER — Telehealth: Payer: Self-pay | Admitting: *Deleted

## 2012-07-27 NOTE — Telephone Encounter (Signed)
Pt. Called earlier today c/o dizziness.Called patient back today she answered phone.  She know who I was and her speech was clear. I offered to see her tomorrow. Patient declined. Says she is dizzy "all the time".  She was recently hospitalized and her discharge dx was vertigo due to labrynthitis vs BPPV.  Her gabapentin was increased from 200mg  tid to 200mg  qid last week.  Possible this may be contributing, I have asked her to reduce her dose from 200 to 100 mg in the afternoon.  She will also f/u with her primary care doctor for this issue.  She has an appt with me on the 26th of this month.

## 2012-07-27 NOTE — Telephone Encounter (Signed)
Unable to reach pt daughter regarding medical forms will attempt again later

## 2012-07-27 NOTE — Telephone Encounter (Signed)
Patient thinks she is having a reaction to her medication.  She is dizzy, sleepy and still in pain.  Offered her an appointment tomorrow but she refused.  She would like a response from doctor Kirchmayer.  Please advise.

## 2012-08-12 NOTE — Telephone Encounter (Signed)
Returned pt call  

## 2012-08-18 ENCOUNTER — Encounter: Payer: Self-pay | Admitting: Physical Medicine and Rehabilitation

## 2012-08-18 ENCOUNTER — Encounter
Payer: Medicare Other | Attending: Physical Medicine and Rehabilitation | Admitting: Physical Medicine and Rehabilitation

## 2012-08-18 VITALS — BP 160/75 | HR 80 | Resp 14 | Ht 60.0 in | Wt 118.8 lb

## 2012-08-18 DIAGNOSIS — M545 Low back pain, unspecified: Secondary | ICD-10-CM

## 2012-08-18 DIAGNOSIS — M79604 Pain in right leg: Secondary | ICD-10-CM

## 2012-08-18 DIAGNOSIS — M549 Dorsalgia, unspecified: Secondary | ICD-10-CM | POA: Insufficient documentation

## 2012-08-18 DIAGNOSIS — Z5181 Encounter for therapeutic drug level monitoring: Secondary | ICD-10-CM | POA: Insufficient documentation

## 2012-08-18 DIAGNOSIS — Z79899 Other long term (current) drug therapy: Secondary | ICD-10-CM | POA: Insufficient documentation

## 2012-08-18 MED ORDER — OXYCODONE-ACETAMINOPHEN 7.5-325 MG PO TABS: 1.0000 | ORAL_TABLET | Freq: Four times a day (QID) | ORAL | Status: DC

## 2012-08-18 NOTE — Progress Notes (Signed)
Subjective:    Patient ID: Shelly Martin, female    DOB: 1919-04-25, 79 y.o.   MRN: [id number]  HPI   79 y/o woman with multilevel thoracolumbar spondylosis and stenosis, s/p L 3 laminectiomy and microdiscectomy per Dr. Kizzie Ide 06/25/11.   She is here today with complaints of continued chronic low back pain especially on the right, right hip and leg pain. There are no changes in her pain.  She comes in today with her notes indicating when she takes her pain medication.   She continues to have chronic right low back and right leg pain.  She reports that some days she is able to engage socially but some days she is unable.  She continues to use a cane.  She denies problems with oversedation.  Pain Inventory Average Pain 10 Pain Right Now 10 My pain is aching  In the last 24 hours, has pain interfered with the following? General activity 9 Relation with others 9 Enjoyment of life 10 What TIME of day is your pain at its worst? varies Sleep (in general) Fair  Pain is worse with: walking, bending, sitting and some activites Pain improves with: heat/ice Relief from Meds: 2  Mobility use a cane how many minutes can you walk? 5 ability to climb steps?  yes do you drive?  no  Function retired I need assistance with the following:  shopping  Neuro/Psych bladder control problems bowel control problems numbness tremor spasms dizziness  Prior Studies Any changes since last visit?  no  Physicians involved in your care Any changes since last visit?  no   Family History  Problem Relation Age of Onset  . Anesthesia problems Neg Hx   . Pancreatic cancer Brother   . Dementia Mother   . Stroke Father    History   Social History  . Marital Status: Widowed    Spouse Name: N/A    Number of Children: 2  . Years of Education: N/A   Occupational History  . Retired    Social History Main Topics  . Smoking status: Never Smoker   . Smokeless tobacco: Never  Used  . Alcohol Use: No     Comment: rarely  . Drug Use: No  . Sexually Active: Not Currently   Other Topics Concern  . None   Social History Narrative  . None   Past Surgical History  Procedure Laterality Date  . Colon resection  50 years ago  . Tonsillectomy    . Uterine fibroid surgery    . Lumbar laminectomy/decompression microdiscectomy  06/25/2011    Procedure: LUMBAR LAMINECTOMY/DECOMPRESSION MICRODISCECTOMY;  Surgeon: Hewitt Shorts, MD;  Location: [facility];  Service: Neurosurgery;  Laterality: Right;  RIGHT Lumbar Two-Three hemilaminectomy and microdiskectomy  . Vaginal hysterectomy     Past Medical History  Diagnosis Date  . Hypertension   . Restless legs syndrome     on Requip  . Ulcer causing bleeding and hole in wall of stomach or small intestine   . Gastric ulcer     years ago  . Arthritis     Back, knees  . Constipation   . Urinary bladder calculus   . Urinary incontinence   . Bowel incontinence   . Heart murmur   . Stroke     Mini, no residual   BP 160/75  Pulse 80  Resp 14  Ht 5' (1.524 m)  Wt 118 lb 12.8 oz (53.887 kg)  BMI 23.2 kg/m2  SpO2 96%  Review of Systems  Constitutional: Positive for unexpected weight change.  Gastrointestinal: Positive for nausea and constipation.       Bowel Control Problems  Genitourinary:       Bladder control problems  Musculoskeletal: Positive for back pain.       Spasms  Neurological: Positive for dizziness and tremors.  All other systems reviewed and are negative.       Objective:   Physical Exam  The patient is an elderly female in no apparent distress.  She is alert oriented x3 cooperative and pleasant. She follows commands without difficulty and answers questions. She is talkative with a bright affect.  Cranial nerves are notable for diminished hearing.  Coordination is grossly intact  Reflexes are 1+ in the upper extremities and diminished in both lower extremities. There is no  abnormal tone or clonus or tremors noted   Light touch Sensation is diminished over the right distal medial thigh as well as in the right foot.  Manual muscle testing reveals 5 over 5 strength in the left lower extremity. Right lower extremity hip flexion is diminished 4+/5. The rest of the right lower extremity is 5 over 5 including knee extensors knee flexors dorsiflexors plantar flexors and EHL.   Transitioning from sitting to standing increases her right low back and right posterior and lateral hip pain.   She has limited lumbar motion   Internal and external rotation at the hips does not provoke pain.  Diffuse tenderness noted in lumbar paraspinal musculature as well as gluteal hip musculature.   Well healed midline lumbar scar noted    Gait is notable for shorter stride length, decreased toe off. Romberg test is performed adequately.   Some mild difficulty noted with tandem gait.        Assessment & Plan:  1. Chronic Low back pain and severe right lower extremity pain secondary to severe lumbar spinal stenosis. Stable  2. Right hip OA: Right hip injection did not accurately improve pain.   The patient is currently taking oxycodone 7.5/325 4 times a day .#60 pills left today.  Pill count is appropriate. She is also taking gabapentin gabapentin 200 mg 1 per os 4 times a day.  Will also add Voltaren gel to apply to right knee 4 times a day. Marland Kitchen  She will continue to document when she takes her pain medication and her pain scores.   Risks and benefits of pain medication reviewed again.  UDS was not done at last visit because pt stated she could not produce a sample.  A sample was requested again today.  Medication log brought in by patient reviewed.  I looks like she has taken her requip twice a day on several days. ( ? Contributing to occasional dizziness).   Pt states she missed appt with neurosurgeon yesterday.  I have encouraged her to reschedule it.  F/u in 4 weeks  for drug monitoring.

## 2012-08-18 NOTE — Patient Instructions (Addendum)
Please continue to document your pain scores as well as when you take your pain medication.  F/u in 3 weeks.

## 2012-08-24 ENCOUNTER — Telehealth: Payer: Self-pay | Admitting: *Deleted

## 2012-08-24 NOTE — Telephone Encounter (Signed)
Returning pt call regarding her "aches and pains" no answer LVM to return call

## 2012-09-06 ENCOUNTER — Telehealth: Payer: Self-pay

## 2012-09-06 NOTE — Telephone Encounter (Signed)
Patient called complaining of increased pain today.  She would like to know what else she can do.  Please advise.

## 2012-09-06 NOTE — Telephone Encounter (Signed)
Patient should apply cold or heating pad, whatever gives her more relief, I do not know the patient and at the age of 78, she might be at fall risk, she is already taking oxycodone, I would not increase her pain med.  An option would be Robaxin, in a low dose, which does not make her as drowsy as other muscle relaxants would, but because I do not know the patient, please ask Dr. Pamelia Hoit.

## 2012-09-06 NOTE — Telephone Encounter (Signed)
Advised patient to try heat and ice.  She says this has helped some but still would like to know if there are any other options.

## 2012-09-08 NOTE — Telephone Encounter (Signed)
She can try over the counter  ICY Hot balm or cream.  If she has experienced trauma, has severe pain, has new weakness, numbness or new bowel or bladder problems may need to go to ED.

## 2012-09-15 ENCOUNTER — Encounter: Payer: Medicare Other | Admitting: Physical Medicine and Rehabilitation

## 2012-09-21 ENCOUNTER — Telehealth: Payer: Self-pay | Admitting: *Deleted

## 2012-09-21 NOTE — Telephone Encounter (Signed)
Pt canceled her appt with pain management due to cost pt wants information regarding pain medication and constipation.advised pt to take miralax 8oz x 2 a day per Dr Constance Goltz will follow up with pt later this week

## 2012-09-22 ENCOUNTER — Other Ambulatory Visit: Payer: Self-pay | Admitting: Internal Medicine

## 2012-09-23 NOTE — Telephone Encounter (Signed)
Refill request

## 2012-09-29 ENCOUNTER — Telehealth: Payer: Self-pay | Admitting: *Deleted

## 2012-09-29 NOTE — Telephone Encounter (Signed)
Thanks

## 2012-09-29 NOTE — Telephone Encounter (Signed)
Pt called [facility] wanting her pain medication to be renewed. Notified pts daughter Shelly Martin and explained that Dr Constance Goltz will not prescribe narcotics for chronic pain and that her RX will need to be renewed by Dr Pamelia Hoit.

## 2012-09-29 NOTE — Telephone Encounter (Signed)
Notified pt daughter of pt call to office regarding her pain meds

## 2012-09-29 NOTE — Telephone Encounter (Signed)
Called pts daughter at her work 3 left message to return call

## 2012-09-30 ENCOUNTER — Emergency Department (HOSPITAL_COMMUNITY): Payer: Medicare Other

## 2012-09-30 ENCOUNTER — Emergency Department (HOSPITAL_COMMUNITY)
Admission: EM | Admit: 2012-09-30 | Discharge: 2012-09-30 | Disposition: A | Discharge status: Home / Self Care | Payer: Medicare Other | Attending: Emergency Medicine | Admitting: Emergency Medicine

## 2012-09-30 ENCOUNTER — Encounter (HOSPITAL_COMMUNITY): Payer: Self-pay | Admitting: Emergency Medicine

## 2012-09-30 DIAGNOSIS — Z87448 Personal history of other diseases of urinary system: Secondary | ICD-10-CM | POA: Insufficient documentation

## 2012-09-30 DIAGNOSIS — D649 Anemia, unspecified: Secondary | ICD-10-CM

## 2012-09-30 DIAGNOSIS — M25551 Pain in right hip: Secondary | ICD-10-CM

## 2012-09-30 DIAGNOSIS — K5900 Constipation, unspecified: Secondary | ICD-10-CM | POA: Insufficient documentation

## 2012-09-30 DIAGNOSIS — Z8659 Personal history of other mental and behavioral disorders: Secondary | ICD-10-CM | POA: Insufficient documentation

## 2012-09-30 DIAGNOSIS — R011 Cardiac murmur, unspecified: Secondary | ICD-10-CM | POA: Insufficient documentation

## 2012-09-30 DIAGNOSIS — IMO0002 Reserved for concepts with insufficient information to code with codable children: Secondary | ICD-10-CM | POA: Insufficient documentation

## 2012-09-30 DIAGNOSIS — Z8673 Personal history of transient ischemic attack (TIA), and cerebral infarction without residual deficits: Secondary | ICD-10-CM | POA: Insufficient documentation

## 2012-09-30 DIAGNOSIS — I10 Essential (primary) hypertension: Secondary | ICD-10-CM | POA: Insufficient documentation

## 2012-09-30 DIAGNOSIS — Z79899 Other long term (current) drug therapy: Secondary | ICD-10-CM | POA: Insufficient documentation

## 2012-09-30 DIAGNOSIS — M25559 Pain in unspecified hip: Secondary | ICD-10-CM | POA: Insufficient documentation

## 2012-09-30 DIAGNOSIS — M5416 Radiculopathy, lumbar region: Secondary | ICD-10-CM

## 2012-09-30 LAB — URINE MICROSCOPIC-ADD ON

## 2012-09-30 LAB — BASIC METABOLIC PANEL
Calcium: 8.9 mg/dL (ref 8.4–10.5)
Creatinine, Ser: 1.03 mg/dL (ref 0.50–1.10)
GFR calc Af Amer: 53 mL/min — ABNORMAL LOW (ref >90)
GFR calc non Af Amer: 46 mL/min — ABNORMAL LOW (ref >90)

## 2012-09-30 LAB — CBC
MCH: 28.7 pg (ref 26.0–34.0)
MCV: 89.6 fL (ref 78.0–100.0)
Platelets: 231 10*3/uL (ref 150–400)
RDW: 13.6 % (ref 11.5–15.5)

## 2012-09-30 LAB — URINALYSIS, ROUTINE W REFLEX MICROSCOPIC
Ketones, ur: NEGATIVE mg/dL
Nitrite: NEGATIVE
Protein, ur: NEGATIVE mg/dL
Urobilinogen, UA: 1 mg/dL (ref 0.0–1.0)

## 2012-09-30 MED ORDER — FENTANYL CITRATE 0.05 MG/ML IJ SOLN
25.0000 ug | Freq: Once | INTRAMUSCULAR | Status: AC
  Administered 2012-09-30: 25 ug via INTRAVENOUS
  Filled 2012-09-30: qty 2

## 2012-09-30 NOTE — ED Provider Notes (Signed)
History     CSN: [id number]  Arrival date & time 09/30/12  1901   First MD Initiated Contact with Patient 09/30/12 2008      Chief Complaint  Patient presents with  . Abdominal Pain    (Consider location/radiation/quality/duration/timing/severity/associated sxs/prior treatment) HPI Shelly Martin is a 79 y.o. female who presents to ED with complaint of lower back and right buttock pain and right groin pain. Pt states back pain for several years, however, right groin pain for about 3wks. States Pain comes and goes, not sure what exacerbates it. States has had problems with constipation for several months. States felt like she was constipated few days ago, took a laxitive, states since yesterday, has had diarrhea. States takes percocet at home for her back pain, but it is not helping. States pain in the back is due to her "lumbar stenosis." states never had pain in the lower abdomen like this before. Denies fever, chills, nausea, vomiting. States no urinary symptoms. No other complaints.    Past Medical History  Diagnosis Date  . Hypertension   . Restless legs syndrome     on Requip  . Ulcer causing bleeding and hole in wall of stomach or small intestine   . Gastric ulcer     years ago  . Arthritis     Back, knees  . Constipation   . Urinary bladder calculus   . Urinary incontinence   . Bowel incontinence   . Heart murmur   . Stroke     Mini, no residual    Past Surgical History  Procedure Laterality Date  . Colon resection  50 years ago  . Tonsillectomy    . Uterine fibroid surgery    . Lumbar laminectomy/decompression microdiscectomy  06/25/2011    Procedure: LUMBAR LAMINECTOMY/DECOMPRESSION MICRODISCECTOMY;  Surgeon: Hewitt Shorts, MD;  Location: [facility];  Service: Neurosurgery;  Laterality: Right;  RIGHT Lumbar Two-Three hemilaminectomy and microdiskectomy  . Vaginal hysterectomy      Family History  Problem Relation Age of Onset  . Anesthesia problems  Neg Hx   . Pancreatic cancer Brother   . Dementia Mother   . Stroke Father     History  Substance Use Topics  . Smoking status: Never Smoker   . Smokeless tobacco: Never Used  . Alcohol Use: No     Comment: rarely    OB History   Grav Para Term Preterm Abortions TAB SAB Ect Mult Living                  Review of Systems  Constitutional: Negative for fever and chills.  HENT: Negative for neck pain and neck stiffness.   Respiratory: Negative.   Cardiovascular: Negative.   Gastrointestinal: Positive for abdominal pain, diarrhea and constipation. Negative for nausea and vomiting.  Genitourinary: Negative for dysuria, urgency and flank pain.  Musculoskeletal: Positive for back pain.  Neurological: Negative for weakness and numbness.    Allergies  Amoxicillin; Baclofen; Hctz; and Valium  Home Medications   Current Outpatient Rx  Name  Route  Sig  Dispense  Refill  . diclofenac sodium (VOLTAREN) 1 % GEL   Topical   Apply 2 g topically 4 (four) times daily as needed. Apply to right knee 4 times per day         . gabapentin (NEURONTIN) 100 MG capsule   Oral   Take 2 capsules (200 mg total) by mouth 4 (four) times daily.   120 capsule   4   .  ibuprofen (ADVIL) 400 MG tablet   Oral   Take 1 tablet (400 mg total) by mouth every 6 (six) hours as needed for pain.   30 tablet   0   . losartan (COZAAR) 50 MG tablet   Oral   Take 50 mg by mouth 2 (two) times daily.         Marland Kitchen oxyCODONE-acetaminophen (PERCOCET) 7.5-325 MG per tablet   Oral   Take 1 tablet by mouth QID. 1 tablet per os at 8:30 AM, 12:30 PM, 4:30 pm and 8:30 PM         . polyethylene glycol powder (GLYCOLAX/MIRALAX) powder   Oral   Take 17 g by mouth at bedtime.         Marland Kitchen rOPINIRole (REQUIP) 2 MG tablet   Oral   Take 4 mg by mouth at bedtime.           BP 154/76  Pulse 71  Temp(Src) 98.4 F (36.9 C) (Oral)  Resp 16  SpO2 97%  Physical Exam  Nursing note and vitals  reviewed. Constitutional: She is oriented to person, place, and time. She appears well-developed and well-nourished. No distress.  Cardiovascular: Normal rate, regular rhythm and normal heart sounds.   Pulmonary/Chest: Effort normal and breath sounds normal. No respiratory distress. She has no wheezes. She has no rales.  Abdominal: Soft. Bowel sounds are normal. She exhibits no distension. There is no tenderness. There is no rebound and no guarding.  Tenderness in right inguinal region. No pain with right straight leg raise against resistance. No hernias noted  Genitourinary:  Non thrombosed, non hemorrhagic external hemorrhoids. Rectal exam normal. Stool soft, brown. No impaction  Musculoskeletal: She exhibits no edema.  Tenderness over right SI joint and right buttock. Pain with right straight leg raise, pain with right hip rom. Normal dorsal pedal pulses.   Neurological: She is alert and oriented to person, place, and time.  Skin: Skin is warm and dry.    ED Course  Procedures (including critical care time)  Pt with right inguinal and right lower back pain. This appears to be chronic, concerned about increasing right inguinal pain. Will get labs, UA, monitor.   Results for orders placed during the hospital encounter of 09/30/12  CBC      Result Value Range   WBC 7.5  4.0 - 10.5 K/uL   RBC 3.45 (*) 3.87 - 5.11 MIL/uL   Hemoglobin 9.9 (*) 12.0 - 15.0 g/dL   HCT 29.5 (*) 62.1 - 30.8 %   MCV 89.6  78.0 - 100.0 fL   MCH 28.7  26.0 - 34.0 pg   MCHC 32.0  30.0 - 36.0 g/dL   RDW 65.7  84.6 - 96.2 %   Platelets 231  150 - 400 K/uL  BASIC METABOLIC PANEL      Result Value Range   Sodium 137  135 - 145 mEq/L   Potassium 4.9  3.5 - 5.1 mEq/L   Chloride 100  96 - 112 mEq/L   CO2 28  19 - 32 mEq/L   Glucose, Bld 95  70 - 99 mg/dL   BUN 33 (*) 6 - 23 mg/dL   Creatinine, Ser 9.52  0.50 - 1.10 mg/dL   Calcium 8.9  8.4 - 84.1 mg/dL   GFR calc non Af Amer 46 (*) >90 mL/min   GFR calc Af  Amer 53 (*) >90 mL/min  URINALYSIS, ROUTINE W REFLEX MICROSCOPIC      Result Value Range  Color, Urine YELLOW  YELLOW   APPearance CLEAR  CLEAR   Specific Gravity, Urine 1.019  1.005 - 1.030   pH 6.0  5.0 - 8.0   Glucose, UA NEGATIVE  NEGATIVE mg/dL   Hgb urine dipstick NEGATIVE  NEGATIVE   Bilirubin Urine NEGATIVE  NEGATIVE   Ketones, ur NEGATIVE  NEGATIVE mg/dL   Protein, ur NEGATIVE  NEGATIVE mg/dL   Urobilinogen, UA 1.0  0.0 - 1.0 mg/dL   Nitrite NEGATIVE  NEGATIVE   Leukocytes, UA MODERATE (*) NEGATIVE  URINE MICROSCOPIC-ADD ON      Result Value Range   Squamous Epithelial / LPF RARE  RARE   WBC, UA 3-6  <3 WBC/hpf   RBC / HPF 0-2  <3 RBC/hpf   Bacteria, UA RARE  RARE  OCCULT BLOOD, POC DEVICE      Result Value Range   Fecal Occult Bld NEGATIVE  NEGATIVE   Dg Abd 2 Views  09/30/2012  *RADIOLOGY REPORT*  Clinical Data: Abdominal pain, question constipation  ABDOMEN - 2 VIEW  Comparison: Plain films 04/23/2012  Findings: Multiple surgical sutures in the mid lower abdomen. There are no dilated loops of large or small bowel.  Moderate volume stool in the ascending colon.  No significant stool in the rectum.  No pathologic calcifications.  Degenerative changes the hips are noted.  IMPRESSION:  1.  No evidence of bowel obstruction. 2.  No evidence of constipation.   Original Report Authenticated By: Genevive Bi, M.D.       1. Lumbar radiculopathy   2. Right hip pain   3. Chronic anemia       MDM  PT with chronic right lower back pain, hx of diskectomy by Dr. Franky Macho 2 years ago. States problems since then, not improving. On percocet chronically at home, which is causing her intermittent constipation. Pt denies new injuries, no weakness or numbness in extremities, no fevers, still ambulatory. Pt concerned about increasing pain in right hip and inguinal region. On exam, abdomen non tender, pain reproduced with right hip ROM. No hernias noted. Abdominal series obtained with  normal bowels, no signs of constipation, obstruction. arthirits in hips noted. No impaction on rectal exam. Pt is in no distress. ua normal. No elevated wbc. Discussed with Dr. Anitra Lauth who came and saw pt. Agrees, pt has no  Surgical abdomen, no signs of cauda equina. Suspect pain due to chronic lumbar radiculopathy and possible hip arthritis. Plan to continue pain medications at home, good bowel routine with stool softners daily, follow up with PCP and Dr. Franky Macho. Pt agrees with the plan.   Filed Vitals:   09/30/12 1912 09/30/12 1915 09/30/12 2000  BP: 154/103 167/62 154/76  Pulse: 73 63 71  Temp: 98.4 F (36.9 C)    TempSrc: Oral    Resp: 16    SpO2: 99% 100% 97%           Kortney Potvin A Brieanna Nau, PA-C 09/30/12 2313

## 2012-09-30 NOTE — ED Provider Notes (Signed)
Medical screening examination/treatment/procedure(s) were conducted as a shared visit with non-physician practitioner(s) and myself.  I personally evaluated the patient during the encounter Patient with complaint of back, hip pain that radiates to her knee. Feel sx are all lumbar radiculopathy or related to arthritis of the hip. She has no symptoms concerning for infection abdominal symptoms her is neurovascularly intact  Shelly Sprout, MD 09/30/12 2320

## 2012-09-30 NOTE — ED Notes (Signed)
Jan (daugther) [phone]

## 2012-09-30 NOTE — ED Notes (Signed)
Per EMS - pt c/o right groin pain, right buttocks pain along with RLQ abd pain X 8 weeks. Pt reports it hurts to have a BM so she has been taking a stool softener and laxatives at home. Pt unable to see PCP until end of month. Pt denies n/v/d. BP 162/88 HR 65 RR 18 O2 97%.

## 2012-09-30 NOTE — ED Notes (Addendum)
Pt states she has been having constipation on and off for years but within the last month constipation has gotten worse. Pt states she took some miralax last night and suppository  2 days ago and now has some uncontrollable diarrhea. Pt also c/o intermittent lower back pain that radiates to groin and leg, described as sharp shooting. Pt rates pain 10/10

## 2012-10-04 NOTE — Telephone Encounter (Signed)
Her oxycodone can not be called in.  I believe she was scheduled to see me 3-4 weeks after her last visit with me.  Did she cancel her last appt. ? She needs to have an appt for monitoring and refill.  Please schedule her to see me (Wednesday 5/14) or with Clydie Braun tomorrow if she can come in.

## 2012-10-04 NOTE — Telephone Encounter (Signed)
Patient request oxycodone refill.

## 2012-10-04 NOTE — Telephone Encounter (Signed)
Patient would like a refill on her oxycodone.  Please advise.

## 2012-10-05 ENCOUNTER — Encounter: Payer: Self-pay | Admitting: Physical Medicine and Rehabilitation

## 2012-10-05 ENCOUNTER — Encounter
Payer: Medicare Other | Attending: Physical Medicine and Rehabilitation | Admitting: Physical Medicine and Rehabilitation

## 2012-10-05 VITALS — BP 111/40 | HR 77 | Resp 14 | Ht <= 58 in | Wt 117.0 lb

## 2012-10-05 DIAGNOSIS — M1611 Unilateral primary osteoarthritis, right hip: Secondary | ICD-10-CM

## 2012-10-05 DIAGNOSIS — M169 Osteoarthritis of hip, unspecified: Secondary | ICD-10-CM

## 2012-10-05 DIAGNOSIS — M1610 Unilateral primary osteoarthritis, unspecified hip: Secondary | ICD-10-CM | POA: Insufficient documentation

## 2012-10-05 DIAGNOSIS — M961 Postlaminectomy syndrome, not elsewhere classified: Secondary | ICD-10-CM

## 2012-10-05 MED ORDER — OXYCODONE-ACETAMINOPHEN 7.5-325 MG PO TABS: 1.0000 | ORAL_TABLET | Freq: Four times a day (QID) | ORAL | Status: DC

## 2012-10-05 NOTE — Patient Instructions (Signed)
Try to exercise and walk in the pool, do not go into the pool all by yourself.

## 2012-10-05 NOTE — Progress Notes (Signed)
Subjective:    Patient ID: Shelly Martin, female    DOB: [birth year], 79 y.o.   MRN: [id number]  HPI 79 y/o woman with multilevel thoracolumbar spondylosis and stenosis, s/p L3-4 laminectomy and microdiscectomy per Dr. Kizzie Ide 06/25/11. The patient states, that Dr. Newell Coral told her that she is not a candidate for further spine surgery.  She is here today with complaints of continued chronic low back pain especially on the right, right hip and leg pain. Today she complains about right hip/groin pain.   She reports that some days she is able to engage socially but some days she is unable.  She continues to use a cane.  She denies problems with oversedation.  Pain Inventory Average Pain 10 Pain Right Now 10 My pain is sharp, burning and aching  In the last 24 hours, has pain interfered with the following? General activity 8 Relation with others 8 Enjoyment of life 10 What TIME of day is your pain at its worst? all the time Sleep (in general) Fair  Pain is worse with: walking, bending, sitting, standing and some activites Pain improves with: medication Relief from Meds: 5  Mobility use a cane ability to climb steps?  yes do you drive?  no  Function retired  Neuro/Psych bowel control problems spasms  Prior Studies hospital visit  Physicians involved in your care Any changes since last visit?  no   Family History  Problem Relation Age of Onset  . Anesthesia problems Neg Hx   . Pancreatic cancer Brother   . Dementia Mother   . Stroke Father    History   Social History  . Marital Status: Widowed    Spouse Name: N/A    Number of Children: 2  . Years of Education: N/A   Occupational History  . Retired    Social History Main Topics  . Smoking status: Never Smoker   . Smokeless tobacco: Never Used  . Alcohol Use: No     Comment: rarely  . Drug Use: No  . Sexually Active: Not Currently   Other Topics Concern  . None   Social History Narrative  .  None   Past Surgical History  Procedure Laterality Date  . Colon resection  50 years ago  . Tonsillectomy    . Uterine fibroid surgery    . Lumbar laminectomy/decompression microdiscectomy  06/25/2011    Procedure: LUMBAR LAMINECTOMY/DECOMPRESSION MICRODISCECTOMY;  Surgeon: Hewitt Shorts, MD;  Location: [facility];  Service: Neurosurgery;  Laterality: Right;  RIGHT Lumbar Two-Three hemilaminectomy and microdiskectomy  . Vaginal hysterectomy     Past Medical History  Diagnosis Date  . Hypertension   . Restless legs syndrome     on Requip  . Ulcer causing bleeding and hole in wall of stomach or small intestine   . Gastric ulcer     years ago  . Arthritis     Back, knees  . Constipation   . Urinary bladder calculus   . Urinary incontinence   . Bowel incontinence   . Heart murmur   . Stroke     Mini, no residual   BP 111/40  Pulse 77  Resp 14  Ht 4\' 9"  (1.448 m)  Wt 117 lb (53.071 kg)  BMI 25.31 kg/m2  SpO2 96%     Review of Systems  Constitutional: Positive for unexpected weight change.  Gastrointestinal: Positive for nausea and constipation.  Genitourinary: Positive for difficulty urinating.  Musculoskeletal: Positive for back pain.  All other systems  reviewed and are negative.       Objective:   Physical Exam The patient is an elderly female in no apparent distress.  She is alert oriented x3 cooperative and pleasant. She follows commands without difficulty and answers questions. She is talkative with a bright affect.  Cranial nerves are notable for diminished hearing.  Coordination is grossly intact  Reflexes are 1+ in the upper extremities and diminished in both lower extremities. There is no abnormal tone or clonus or tremors noted  Light touch Sensation is diminished over the right distal medial thigh as well as in the right foot.  Manual muscle testing reveals 5 over 5 strength in the left lower extremity. Right lower extremity hip flexion is  4/5.  The rest of the right lower extremity is 5 over 5 including knee extensors knee flexors dorsiflexors plantar flexors and EHL.  Transitioning from sitting to standing increases her right low back and right anterior hip pain.  She has limited lumbar motion  Internal rotation at the right hip does provoke pain, flexion 100 degrees, extension deficit 15 degrees in flexion.Walking in flexed position of right hip, shorter step on the right and a limp.  Diffuse tenderness noted in lumbar paraspinal musculature as well as gluteal hip musculature.  Well healed midline lumbar scar noted   Romberg test is performed adequately.  Some mild difficulty noted with tandem gait.         Assessment & Plan:  1. Chronic Low back pain and severe right lower extremity pain secondary to severe lumbar spinal stenosis. Stable  2. Right hip severe OA, per MRI: Right hip injection did not accurately improve pain, referral to Dr. Despina Hick to evaluate. Antalgic gait, position of hip exacerbates her LBP. The patient is currently taking oxycodone 7.5/325 4 times a day  Pill count is appropriate.  She is also taking gabapentin gabapentin 200 mg 1 per os 4 times a day.   Voltaren gel to apply to right knee 4 times a day, does not help much. .  Risks and benefits of pain medication reviewed again.  UDS was not done at last visit because pt stated she could not produce a sample, will repeat at next visit.  Recommended walking in the pool at her home facility, only under supervision. Follow up in a month

## 2012-10-05 NOTE — Telephone Encounter (Signed)
Spoke with patients daughter to make an appointment.  They are very unhappy that we will not just refill medication.  Advised them that the medication they are requesting is a controlled substance.  They will make an appointment with Clydie Braun, the daughter cannot come in on Wednesdays due to work.

## 2012-10-05 NOTE — Telephone Encounter (Signed)
Tried to contact patient to make appoinment.  No answer.  Will try again later.

## 2012-10-07 ENCOUNTER — Other Ambulatory Visit: Payer: Self-pay | Admitting: Internal Medicine

## 2012-10-07 NOTE — Telephone Encounter (Signed)
Refill request

## 2012-10-10 NOTE — Telephone Encounter (Signed)
Shelly Martin  Please call Dr. Jerrilyn Cairo office and have them refill this.  They do not like other MD"S filling pain meds

## 2012-10-11 ENCOUNTER — Other Ambulatory Visit: Payer: Self-pay | Admitting: *Deleted

## 2012-10-11 MED ORDER — GABAPENTIN 100 MG PO CAPS: 200.0000 mg | ORAL_CAPSULE | Freq: Four times a day (QID) | ORAL | Status: DC

## 2012-10-11 NOTE — Telephone Encounter (Signed)
Patient called again this morning wanting her Gabapentin refilled.  She wants the RN to call her back about her pain.

## 2012-10-11 NOTE — Telephone Encounter (Signed)
Refill request

## 2012-10-11 NOTE — Telephone Encounter (Signed)
Called [facility] on [address], and let the pharmacy know that refill request would need to go to Dr Pamelia Hoit.

## 2012-10-12 ENCOUNTER — Telehealth: Payer: Self-pay | Admitting: *Deleted

## 2012-10-12 NOTE — Telephone Encounter (Signed)
Faxed recent records to insurance co

## 2012-10-13 ENCOUNTER — Ambulatory Visit: Payer: Medicare Other | Admitting: Physical Medicine and Rehabilitation

## 2012-10-14 ENCOUNTER — Other Ambulatory Visit: Payer: Self-pay | Admitting: Internal Medicine

## 2012-10-15 NOTE — Telephone Encounter (Signed)
Refill request

## 2012-10-19 ENCOUNTER — Other Ambulatory Visit: Payer: Self-pay | Admitting: *Deleted

## 2012-10-19 ENCOUNTER — Other Ambulatory Visit: Payer: Self-pay | Admitting: Physical Medicine and Rehabilitation

## 2012-10-19 MED ORDER — ROPINIROLE HCL 2 MG PO TABS: 4.0000 mg | ORAL_TABLET | Freq: Every day | ORAL | Status: DC

## 2012-10-19 NOTE — Telephone Encounter (Signed)
Daughter called regarding  pts pain management wants a referral to another pain management MD. Pt daughter also has questions about getting 90 days of medication

## 2012-10-19 NOTE — Telephone Encounter (Signed)
Refill request

## 2012-10-20 ENCOUNTER — Telehealth: Payer: Self-pay | Admitting: Internal Medicine

## 2012-10-20 ENCOUNTER — Other Ambulatory Visit: Payer: Self-pay | Admitting: Internal Medicine

## 2012-10-20 ENCOUNTER — Telehealth: Payer: Self-pay | Admitting: *Deleted

## 2012-10-20 DIAGNOSIS — M48061 Spinal stenosis, lumbar region without neurogenic claudication: Secondary | ICD-10-CM

## 2012-10-20 NOTE — Telephone Encounter (Signed)
Spoke with pt and daughter on Tuesday 5/27.  Daughter seems unhappy with care by Dr. Pamelia Hoit and the office PA.  She would like to see another pain specialist.  Pt has seen Dr. Newell Coral in the past and will check to see if the pain management MD at that office will see the patient  [name] notes she is losing weight.  She is not using any nutritional supplements although both the [facility] congregational nurse an myself have recommended nutritional supplements and puddings on multiple occasions.  I advised pt and daughter to call for appt to see me in office.

## 2012-10-20 NOTE — Telephone Encounter (Signed)
Referral and office notes faxed to Dr Ollen Bowl office

## 2012-10-22 NOTE — Telephone Encounter (Signed)
Dr. Glorious Peach is prescribing this per med chart.

## 2012-10-27 ENCOUNTER — Other Ambulatory Visit: Payer: Self-pay | Admitting: Internal Medicine

## 2012-10-27 ENCOUNTER — Telehealth: Payer: Self-pay | Admitting: *Deleted

## 2012-10-27 MED ORDER — GABAPENTIN 100 MG PO CAPS: 200.0000 mg | ORAL_CAPSULE | Freq: Four times a day (QID) | ORAL | Status: DC

## 2012-10-27 NOTE — Telephone Encounter (Signed)
Shelly Martin  I sent in refill for [name].   Please call Dr. Ollen Bowl office and find out when her appt is .  Let me know

## 2012-10-27 NOTE — Telephone Encounter (Signed)
Returned pt call regarding pain will speak with pt daughter and Dr Constance Goltz

## 2012-10-27 NOTE — Telephone Encounter (Signed)
Pt is awaiting return call fron Dr Ollen Bowl office for an appt she has been out of neurontin x one week and can not wait until appt pain is significantly worse without this medication can we refill this x one month until she can see Dr Ollen Bowl

## 2012-11-01 ENCOUNTER — Encounter: Payer: Self-pay | Admitting: *Deleted

## 2012-11-02 ENCOUNTER — Encounter: Payer: Self-pay | Admitting: Physical Medicine and Rehabilitation

## 2012-11-02 ENCOUNTER — Encounter
Payer: Medicare Other | Attending: Physical Medicine and Rehabilitation | Admitting: Physical Medicine and Rehabilitation

## 2012-11-02 VITALS — BP 180/82 | HR 90 | Resp 16 | Ht 59.0 in | Wt 117.0 lb

## 2012-11-02 DIAGNOSIS — G2581 Restless legs syndrome: Secondary | ICD-10-CM | POA: Insufficient documentation

## 2012-11-02 DIAGNOSIS — G8929 Other chronic pain: Secondary | ICD-10-CM | POA: Insufficient documentation

## 2012-11-02 DIAGNOSIS — M169 Osteoarthritis of hip, unspecified: Secondary | ICD-10-CM | POA: Insufficient documentation

## 2012-11-02 DIAGNOSIS — M1610 Unilateral primary osteoarthritis, unspecified hip: Secondary | ICD-10-CM | POA: Insufficient documentation

## 2012-11-02 DIAGNOSIS — I10 Essential (primary) hypertension: Secondary | ICD-10-CM | POA: Insufficient documentation

## 2012-11-02 DIAGNOSIS — Z79899 Other long term (current) drug therapy: Secondary | ICD-10-CM | POA: Insufficient documentation

## 2012-11-02 DIAGNOSIS — M48061 Spinal stenosis, lumbar region without neurogenic claudication: Secondary | ICD-10-CM | POA: Insufficient documentation

## 2012-11-02 DIAGNOSIS — M545 Low back pain, unspecified: Secondary | ICD-10-CM | POA: Insufficient documentation

## 2012-11-02 DIAGNOSIS — M25559 Pain in unspecified hip: Secondary | ICD-10-CM

## 2012-11-02 DIAGNOSIS — M79609 Pain in unspecified limb: Secondary | ICD-10-CM | POA: Insufficient documentation

## 2012-11-02 MED ORDER — OXYCODONE-ACETAMINOPHEN 7.5-325 MG PO TABS: 1.0000 | ORAL_TABLET | Freq: Four times a day (QID) | ORAL | Status: DC

## 2012-11-02 NOTE — Patient Instructions (Signed)
Stay as active as tolerated. 

## 2012-11-02 NOTE — Telephone Encounter (Signed)
Pt has an appt with Dr. Ollen Bowl on 7/1 at 1:15 LVM message on pts home phone will attempt to call again

## 2012-11-02 NOTE — Progress Notes (Signed)
Subjective:    Patient ID: Shelly Martin, female    DOB: 1918-11-29, 79 y.o.   MRN: [id number]  HPI 79 y/o woman with multilevel thoracolumbar spondylosis and stenosis, s/p L3-4 laminectomy and microdiscectomy per Dr. Kizzie Ide 06/25/11. The patient states, that Dr. Newell Coral told her that she is not a candidate for further spine surgery.  She is here today with complaints of continued chronic low back pain especially on the right, right hip and leg pain. Today she complains about right hip/groin pain. She has taken her last pain medication yesterday, and is in severe pain now, she is here for a refill of her medication. She reports, that she wants to see another pain specialist in Dr. Newell Coral 's office because she and her daughter are not happy with the service of our office. She did not get an appointment yet with the new office. She reports that some days she is able to engage socially but some days she is unable.  She continues to use a cane.  She denies problems with oversedation.  Pain Inventory Average Pain 10 Pain Right Now 10 My pain is constant, sharp and burning  In the last 24 hours, has pain interfered with the following? General activity 9 Relation with others 6 Enjoyment of life 10 What TIME of day is your pain at its worst? morning Sleep (in general) Fair  Pain is worse with: walking and some activites Pain improves with: medication Relief from Meds: 7  Mobility walk with assistance use a cane ability to climb steps?  yes do you drive?  no  Function retired I need assistance with the following:  meal prep and household duties  Neuro/Psych bladder control problems bowel control problems numbness spasms depression  Prior Studies Any changes since last visit?  no  Physicians involved in your care Any changes since last visit?  no   Family History  Problem Relation Age of Onset  . Anesthesia problems Neg Hx   . Pancreatic cancer Brother   .  Dementia Mother   . Stroke Father    History   Social History  . Marital Status: Widowed    Spouse Name: N/A    Number of Children: 2  . Years of Education: N/A   Occupational History  . Retired    Social History Main Topics  . Smoking status: Never Smoker   . Smokeless tobacco: Never Used  . Alcohol Use: No     Comment: rarely  . Drug Use: No  . Sexually Active: Not Currently   Other Topics Concern  . None   Social History Narrative  . None   Past Surgical History  Procedure Laterality Date  . Colon resection  50 years ago  . Tonsillectomy    . Uterine fibroid surgery    . Lumbar laminectomy/decompression microdiscectomy  06/25/2011    Procedure: LUMBAR LAMINECTOMY/DECOMPRESSION MICRODISCECTOMY;  Surgeon: Hewitt Shorts, MD;  Location: [facility];  Service: Neurosurgery;  Laterality: Right;  RIGHT Lumbar Two-Three hemilaminectomy and microdiskectomy  . Vaginal hysterectomy     Past Medical History  Diagnosis Date  . Hypertension   . Restless legs syndrome     on Requip  . Ulcer causing bleeding and hole in wall of stomach or small intestine   . Gastric ulcer     years ago  . Arthritis     Back, knees  . Constipation   . Urinary bladder calculus   . Urinary incontinence   . Bowel incontinence   .  Heart murmur   . Stroke     Mini, no residual   BP 180/82  Pulse 90  Resp 16  Ht 4\' 11"  (1.499 m)  Wt 117 lb (53.071 kg)  BMI 23.62 kg/m2  SpO2 97%     Review of Systems  Constitutional: Positive for appetite change and unexpected weight change.  Gastrointestinal: Positive for constipation.  Genitourinary: Positive for difficulty urinating.  Neurological: Positive for numbness.  Psychiatric/Behavioral: Positive for dysphoric mood.  All other systems reviewed and are negative.       Objective:   Physical Exam The patient is an elderly female in no apparent distress.  She is alert oriented x3 cooperative and pleasant. She follows commands  without difficulty and answers questions. She is talkative with a bright affect.  Cranial nerves are notable for diminished hearing.  Coordination is grossly intact  Reflexes are 1+ in the upper extremities and diminished in both lower extremities. There is no abnormal tone or clonus or tremors noted  Light touch Sensation is diminished over the right distal medial thigh as well as in the right foot.  Manual muscle testing reveals 5 over 5 strength in the left lower extremity. Right lower extremity hip flexion is 4/5. The rest of the right lower extremity is 5 over 5 including knee extensors knee flexors dorsiflexors plantar flexors and EHL.  Transitioning from sitting to standing increases her right low back and right anterior hip pain.  She has limited lumbar motion  Internal rotation at the right hip does provoke pain, flexion 100 degrees, extension deficit 15 degrees in flexion.Walking in flexed position of right hip, shorter step on the right and a limp.  Diffuse tenderness noted in lumbar paraspinal musculature as well as gluteal hip musculature.  Well healed midline lumbar scar noted  Romberg test is performed adequately.  Some mild difficulty noted with tandem gait.         Assessment & Plan:  1. Chronic Low back pain and severe right lower extremity pain secondary to severe lumbar spinal stenosis. Stable  2. Right hip severe OA, per MRI: Right hip injection did not accurately improve pain, referral to Dr. Despina Hick to evaluate, we send a referral to him , but patient and her daughter stated that they did not get a call from this office yet. After the visit I talked to [facility], who stated that she send the referral over, and that she will call the surgeon's office again. Antalgic gait, position of hip exacerbates her LBP.  The patient is currently taking oxycodone 7.5/325 4 times a day   She is also taking gabapentin gabapentin 200 mg 1 per os 4 times a day.  Voltaren gel to apply to  right knee 4 times a day, does not help much. .  Risks and benefits of pain medication reviewed again.  UDS was not done at last visit because pt stated she could not produce a sample, will repeat at next visit.  She reports, that she wants to see another pain specialist in Dr. Newell Coral 's office because she and her daughter are not happy with the service, especially the staff, of our office. She did not get an appointment yet with the new office. I stated that this is very unfortunate, I only saw them once before, and they actually said they were happy with the visit last time I saw them. I gave her a refill for her Oxycodone for another month, after this she is most likely seeing the  other pain specialist.

## 2012-11-08 ENCOUNTER — Telehealth: Payer: Self-pay | Admitting: *Deleted

## 2012-11-08 NOTE — Telephone Encounter (Signed)
Spoke with pt daughter about pt chronic pain and frequent loose bowel movements. Daughter states that her bowel movements are no worse than normal bowel trouble. Pt mail concern is pain management. Daughter states that pt has enough of her medication and that it is prescribed every 4 hours and wears off at 3 hours, daughter asked if medication can be taken q3 hours. Advised daughter against this and educated her on the possibility of acetaminophen overdose, drowsiness and increased risk of falls and resp depression. Advised daughter on non pharmaceutical pain interventions such as heat and ice therapy, and exercise as tolerated and with supervision Suggested that daughter may consider home health company that provides companion services. Daughter expressed concerns over expenses. Advised daughter that I would try to contact several companies that offer companion services

## 2012-11-13 ENCOUNTER — Emergency Department (HOSPITAL_COMMUNITY)
Admission: EM | Admit: 2012-11-13 | Discharge: 2012-11-13 | Disposition: A | Discharge status: Home / Self Care | Payer: Medicare Other | Attending: Emergency Medicine | Admitting: Emergency Medicine

## 2012-11-13 ENCOUNTER — Encounter (HOSPITAL_COMMUNITY): Payer: Self-pay

## 2012-11-13 DIAGNOSIS — Z79899 Other long term (current) drug therapy: Secondary | ICD-10-CM | POA: Insufficient documentation

## 2012-11-13 DIAGNOSIS — R011 Cardiac murmur, unspecified: Secondary | ICD-10-CM | POA: Insufficient documentation

## 2012-11-13 DIAGNOSIS — Z8711 Personal history of peptic ulcer disease: Secondary | ICD-10-CM | POA: Insufficient documentation

## 2012-11-13 DIAGNOSIS — R52 Pain, unspecified: Secondary | ICD-10-CM | POA: Insufficient documentation

## 2012-11-13 DIAGNOSIS — Z9889 Other specified postprocedural states: Secondary | ICD-10-CM | POA: Insufficient documentation

## 2012-11-13 DIAGNOSIS — Z8673 Personal history of transient ischemic attack (TIA), and cerebral infarction without residual deficits: Secondary | ICD-10-CM | POA: Insufficient documentation

## 2012-11-13 DIAGNOSIS — I10 Essential (primary) hypertension: Secondary | ICD-10-CM | POA: Insufficient documentation

## 2012-11-13 DIAGNOSIS — IMO0001 Reserved for inherently not codable concepts without codable children: Secondary | ICD-10-CM | POA: Insufficient documentation

## 2012-11-13 DIAGNOSIS — M545 Low back pain, unspecified: Secondary | ICD-10-CM | POA: Insufficient documentation

## 2012-11-13 DIAGNOSIS — G2581 Restless legs syndrome: Secondary | ICD-10-CM | POA: Insufficient documentation

## 2012-11-13 DIAGNOSIS — Z87442 Personal history of urinary calculi: Secondary | ICD-10-CM | POA: Insufficient documentation

## 2012-11-13 DIAGNOSIS — M129 Arthropathy, unspecified: Secondary | ICD-10-CM | POA: Insufficient documentation

## 2012-11-13 DIAGNOSIS — Z8719 Personal history of other diseases of the digestive system: Secondary | ICD-10-CM | POA: Insufficient documentation

## 2012-11-13 DIAGNOSIS — G8929 Other chronic pain: Secondary | ICD-10-CM | POA: Insufficient documentation

## 2012-11-13 LAB — URINALYSIS, ROUTINE W REFLEX MICROSCOPIC
Ketones, ur: NEGATIVE mg/dL
Nitrite: NEGATIVE
Protein, ur: NEGATIVE mg/dL
Urobilinogen, UA: 1 mg/dL (ref 0.0–1.0)

## 2012-11-13 LAB — CBC WITH DIFFERENTIAL/PLATELET
Basophils Absolute: 0 10*3/uL (ref 0.0–0.1)
Basophils Relative: 0 % (ref 0–1)
Eosinophils Relative: 0 % (ref 0–5)
HCT: 34.1 % — ABNORMAL LOW (ref 36.0–46.0)
MCHC: 32 g/dL (ref 30.0–36.0)
MCV: 87.4 fL (ref 78.0–100.0)
Monocytes Absolute: 0.4 10*3/uL (ref 0.1–1.0)
Neutro Abs: 5.4 10*3/uL (ref 1.7–7.7)
Platelets: 230 10*3/uL (ref 150–400)
RDW: 13.3 % (ref 11.5–15.5)

## 2012-11-13 LAB — BASIC METABOLIC PANEL
Calcium: 9.5 mg/dL (ref 8.4–10.5)
Creatinine, Ser: 0.8 mg/dL (ref 0.50–1.10)
GFR calc Af Amer: 72 mL/min — ABNORMAL LOW (ref >90)
Sodium: 139 mEq/L (ref 135–145)

## 2012-11-13 MED ORDER — SODIUM CHLORIDE 0.9 % IV SOLN: INTRAVENOUS | Status: DC

## 2012-11-13 MED ORDER — HYDROMORPHONE HCL PF 1 MG/ML IJ SOLN
0.5000 mg | Freq: Once | INTRAMUSCULAR | Status: AC
  Administered 2012-11-13: 0.5 mg via INTRAVENOUS
  Filled 2012-11-13: qty 1

## 2012-11-13 MED ORDER — ONDANSETRON HCL 4 MG/2ML IJ SOLN
4.0000 mg | Freq: Once | INTRAMUSCULAR | Status: AC
  Administered 2012-11-13: 4 mg via INTRAVENOUS
  Filled 2012-11-13: qty 2

## 2012-11-13 MED ORDER — SODIUM CHLORIDE 0.9 % IV BOLUS (SEPSIS)
250.0000 mL | Freq: Once | INTRAVENOUS | Status: AC
  Administered 2012-11-13: 250 mL via INTRAVENOUS

## 2012-11-13 MED ORDER — HYDROCODONE-ACETAMINOPHEN 7.5-325 MG PO TABS: 1.0000 | ORAL_TABLET | Freq: Four times a day (QID) | ORAL | Status: DC | PRN

## 2012-11-13 MED ORDER — GABAPENTIN 100 MG PO CAPS: 100.0000 mg | ORAL_CAPSULE | Freq: Three times a day (TID) | ORAL | Status: DC

## 2012-11-13 NOTE — ED Notes (Signed)
Bed:WA10<BR> Expected date:<BR> Expected time:<BR> Means of arrival:<BR> Comments:<BR> EMS

## 2012-11-13 NOTE — ED Provider Notes (Signed)
History     CSN: [id number]  Arrival date & time 11/13/12  8657   First MD Initiated Contact with Patient 11/13/12 1946      Chief Complaint  Patient presents with  . Back Pain    (Consider location/radiation/quality/duration/timing/severity/associated sxs/prior treatment) Patient is a 79 y.o. female presenting with back pain. The history is provided by the patient.  Back Pain Associated symptoms: no abdominal pain, no chest pain, no dysuria, no fever and no headaches    patient with history of chronic pain. Followed by pain management. Brought in by EMS for exacerbation of her pain and back pain started yesterday she is out of her Neurontin. Still has her oxycodone. Patient was able to ambulate with walker. Denies any bladder problems. Pain is 10 out of 10 states it's a lower rate mostly in her back. Described as an ache and sharp.  Past Medical History  Diagnosis Date  . Hypertension   . Restless legs syndrome     on Requip  . Ulcer causing bleeding and hole in wall of stomach or small intestine   . Gastric ulcer     years ago  . Arthritis     Back, knees  . Constipation   . Urinary bladder calculus   . Urinary incontinence   . Bowel incontinence   . Heart murmur   . Stroke     Mini, no residual    Past Surgical History  Procedure Laterality Date  . Colon resection  50 years ago  . Tonsillectomy    . Uterine fibroid surgery    . Lumbar laminectomy/decompression microdiscectomy  06/25/2011    Procedure: LUMBAR LAMINECTOMY/DECOMPRESSION MICRODISCECTOMY;  Surgeon: Hewitt Shorts, MD;  Location: [facility];  Service: Neurosurgery;  Laterality: Right;  RIGHT Lumbar Two-Three hemilaminectomy and microdiskectomy  . Vaginal hysterectomy      Family History  Problem Relation Age of Onset  . Anesthesia problems Neg Hx   . Pancreatic cancer Brother   . Dementia Mother   . Stroke Father     History  Substance Use Topics  . Smoking status: Never Smoker   .  Smokeless tobacco: Never Used  . Alcohol Use: No     Comment: rarely    OB History   Grav Para Term Preterm Abortions TAB SAB Ect Mult Living                  Review of Systems  Constitutional: Negative for fever.  HENT: Negative for neck pain.   Eyes: Negative for visual disturbance.  Respiratory: Negative for shortness of breath.   Cardiovascular: Negative for chest pain.  Gastrointestinal: Negative for nausea, vomiting and abdominal pain.  Genitourinary: Negative for dysuria.  Musculoskeletal: Positive for myalgias and back pain.  Skin: Negative for rash.  Neurological: Negative for headaches.  Hematological: Does not bruise/bleed easily.  Psychiatric/Behavioral: Negative for confusion.    Allergies  Amoxicillin; Baclofen; Hctz; and Valium  Home Medications   Current Outpatient Rx  Name  Route  Sig  Dispense  Refill  . diclofenac sodium (VOLTAREN) 1 % GEL   Topical   Apply 2 g topically 4 (four) times daily as needed. Apply to right knee 4 times per day         . gabapentin (NEURONTIN) 100 MG capsule   Oral   Take 2 capsules (200 mg total) by mouth 4 (four) times daily.   56 capsule   1   . ibuprofen (ADVIL) 400 MG tablet  Oral   Take 1 tablet (400 mg total) by mouth every 6 (six) hours as needed for pain.   30 tablet   0   . losartan (COZAAR) 50 MG tablet   Oral   Take 50 mg by mouth 2 (two) times daily.         Marland Kitchen oxyCODONE-acetaminophen (PERCOCET) 7.5-325 MG per tablet   Oral   Take 1 tablet by mouth QID. 1 tablet per os at 8:30 AM, 12:30 PM, 4:30 pm and 8:30 PM   120 tablet   0   . polyethylene glycol powder (GLYCOLAX/MIRALAX) powder   Oral   Take 17 g by mouth at bedtime.         Marland Kitchen rOPINIRole (REQUIP) 2 MG tablet   Oral   Take 2 tablets (4 mg total) by mouth at bedtime.   180 tablet   1   . gabapentin (NEURONTIN) 100 MG capsule   Oral   Take 1 capsule (100 mg total) by mouth 3 (three) times daily.   90 capsule   0   .  HYDROcodone-acetaminophen (NORCO) 7.5-325 MG per tablet   Oral   Take 1 tablet by mouth every 6 (six) hours as needed for pain.   20 tablet   0     BP 195/75  Pulse 62  Temp(Src) 99.4 F (37.4 C) (Oral)  Resp 20  SpO2 100%  Physical Exam  Nursing note and vitals reviewed. Constitutional: She is oriented to person, place, and time. She appears well-developed and well-nourished. No distress.  HENT:  Head: Normocephalic and atraumatic.  Mouth/Throat: Oropharynx is clear and moist.  Eyes: Conjunctivae and EOM are normal. Pupils are equal, round, and reactive to light.  Neck: Normal range of motion.  Cardiovascular: Normal rate, regular rhythm and normal heart sounds.   No murmur heard. Pulmonary/Chest: Effort normal and breath sounds normal. No respiratory distress.  Abdominal: Soft. Bowel sounds are normal. There is no tenderness.  Musculoskeletal: Normal range of motion.  Neurological: She is alert and oriented to person, place, and time. No cranial nerve deficit. She exhibits normal muscle tone. Coordination normal.  Skin: Skin is warm. No rash noted.    ED Course  Procedures (including critical care time)  Labs Reviewed  CBC WITH DIFFERENTIAL - Abnormal; Notable for the following:    Hemoglobin 10.9 (*)    HCT 34.1 (*)    All other components within normal limits  BASIC METABOLIC PANEL - Abnormal; Notable for the following:    Glucose, Bld 109 (*)    GFR calc non Af Amer 62 (*)    GFR calc Af Amer 72 (*)    All other components within normal limits  URINALYSIS, ROUTINE W REFLEX MICROSCOPIC - Abnormal; Notable for the following:    Leukocytes, UA TRACE (*)    All other components within normal limits  URINE MICROSCOPIC-ADD ON   No results found.  Results for orders placed during the hospital encounter of 11/13/12  CBC WITH DIFFERENTIAL      Result Value Range   WBC 7.4  4.0 - 10.5 K/uL   RBC 3.90  3.87 - 5.11 MIL/uL   Hemoglobin 10.9 (*) 12.0 - 15.0 g/dL   HCT  84.1 (*) 32.4 - 46.0 %   MCV 87.4  78.0 - 100.0 fL   MCH 27.9  26.0 - 34.0 pg   MCHC 32.0  30.0 - 36.0 g/dL   RDW 40.1  02.7 - 25.3 %   Platelets 230  150 - 400 K/uL   Neutrophils Relative % 72  43 - 77 %   Neutro Abs 5.4  1.7 - 7.7 K/uL   Lymphocytes Relative 22  12 - 46 %   Lymphs Abs 1.6  0.7 - 4.0 K/uL   Monocytes Relative 5  3 - 12 %   Monocytes Absolute 0.4  0.1 - 1.0 K/uL   Eosinophils Relative 0  0 - 5 %   Eosinophils Absolute 0.0  0.0 - 0.7 K/uL   Basophils Relative 0  0 - 1 %   Basophils Absolute 0.0  0.0 - 0.1 K/uL  BASIC METABOLIC PANEL      Result Value Range   Sodium 139  135 - 145 mEq/L   Potassium 3.8  3.5 - 5.1 mEq/L   Chloride 100  96 - 112 mEq/L   CO2 28  19 - 32 mEq/L   Glucose, Bld 109 (*) 70 - 99 mg/dL   BUN 21  6 - 23 mg/dL   Creatinine, Ser 1.61  0.50 - 1.10 mg/dL   Calcium 9.5  8.4 - 09.6 mg/dL   GFR calc non Af Amer 62 (*) >90 mL/min   GFR calc Af Amer 72 (*) >90 mL/min  URINALYSIS, ROUTINE W REFLEX MICROSCOPIC      Result Value Range   Color, Urine YELLOW  YELLOW   APPearance CLEAR  CLEAR   Specific Gravity, Urine 1.014  1.005 - 1.030   pH 7.5  5.0 - 8.0   Glucose, UA NEGATIVE  NEGATIVE mg/dL   Hgb urine dipstick NEGATIVE  NEGATIVE   Bilirubin Urine NEGATIVE  NEGATIVE   Ketones, ur NEGATIVE  NEGATIVE mg/dL   Protein, ur NEGATIVE  NEGATIVE mg/dL   Urobilinogen, UA 1.0  0.0 - 1.0 mg/dL   Nitrite NEGATIVE  NEGATIVE   Leukocytes, UA TRACE (*) NEGATIVE  URINE MICROSCOPIC-ADD ON      Result Value Range   Squamous Epithelial / LPF RARE  RARE   WBC, UA 0-2  <3 WBC/hpf   Bacteria, UA RARE  RARE     1. Acute exacerbation of chronic low back pain       MDM   Patient's pain improved here in the emergency department with pain medication. Patient within exacerbation of her chronic pain periods out of her Neurontin. Patient's labs here today without any significant findings no evidence urinary tract infection. No evidence of a collection light  abnormality or leukocytosis or anemia. Feel that patient's main complaint was that of her chronic pain which is now improved. We'll renew her Neurontin and hydrocodone. Patient will followup with her pain management Dr. in the next few days.        Shelda Jakes, MD 11/13/12 2209

## 2012-11-13 NOTE — ED Notes (Signed)
Per EMS, pt has hx of spinal stenosis.  Back pain started yesterday.  Pt is out of home meds, neurontin. Pt is on oxycodone and has been taking it.  States she had dilaudid a while back and it worked for her pain.  Pt ambulates with walker.   Hx HTN, TIA, bladder problems.  Vitals:  140/90, 76, resp 16, 10/10.

## 2012-12-17 ENCOUNTER — Telehealth: Payer: Self-pay | Admitting: *Deleted

## 2012-12-17 NOTE — Telephone Encounter (Signed)
Pt called stating that she was in severe pain states that she had an injection and pain has worsened. Recommended that pt call Dr Ollen Bowl office and try to get an earlier appt. Pt states that she is out of ibuprofen. Advised pt to try 400 mg of ibuprofen with her oxycodone

## 2012-12-21 ENCOUNTER — Other Ambulatory Visit: Payer: Self-pay | Admitting: Internal Medicine

## 2012-12-29 ENCOUNTER — Telehealth: Payer: Self-pay | Admitting: *Deleted

## 2012-12-29 DIAGNOSIS — I10 Essential (primary) hypertension: Secondary | ICD-10-CM

## 2012-12-29 MED ORDER — LOSARTAN POTASSIUM 50 MG PO TABS: 50.0000 mg | ORAL_TABLET | Freq: Two times a day (BID) | ORAL | Status: DC

## 2012-12-29 NOTE — Telephone Encounter (Signed)
Called Pt to see how I can help - she adv has had diarrhea and weight loss - she states she has lost 30 pounds in the last 4-5 months and the diarrhea has become more intense in the last 2 months - I explained she needs to come in for eval with Dr Lynnae Prude - Pt is concerned about having the money to pay for a visit along with the other specialist - I adv pt to call the billing dept to work out a payment plan for any bills that she receives from [facility] - She understood and will make appt to come in for office visit

## 2012-12-29 NOTE — Telephone Encounter (Signed)
Rcvd fax from pharmacy requesting refill - will send in 3 month supply with 1 refill to [facility] as requested

## 2012-12-29 NOTE — Telephone Encounter (Signed)
Patient has question for nurse; she would not elaborate.

## 2013-01-04 ENCOUNTER — Encounter: Payer: Self-pay | Admitting: Internal Medicine

## 2013-01-04 ENCOUNTER — Ambulatory Visit: Payer: Medicare Other | Admitting: Internal Medicine

## 2013-01-04 ENCOUNTER — Ambulatory Visit (INDEPENDENT_AMBULATORY_CARE_PROVIDER_SITE_OTHER): Payer: Medicare Other | Admitting: Internal Medicine

## 2013-01-04 VITALS — BP 140/68 | HR 83 | Temp 97.8°F | Resp 20 | Wt 114.0 lb

## 2013-01-04 DIAGNOSIS — R634 Abnormal weight loss: Secondary | ICD-10-CM

## 2013-01-04 DIAGNOSIS — F439 Reaction to severe stress, unspecified: Secondary | ICD-10-CM

## 2013-01-04 DIAGNOSIS — M48061 Spinal stenosis, lumbar region without neurogenic claudication: Secondary | ICD-10-CM

## 2013-01-04 DIAGNOSIS — R197 Diarrhea, unspecified: Secondary | ICD-10-CM

## 2013-01-04 DIAGNOSIS — Z733 Stress, not elsewhere classified: Secondary | ICD-10-CM

## 2013-01-04 NOTE — Progress Notes (Signed)
Subjective:    Patient ID: Shelly Martin, female    DOB: June 18, 1918, 79 y.o.   MRN: [id number]  HPI Shelly Martin is here with her daughter Shelly Martin.     Shelly Martin has seen Dr. Norville Haggard at [facility] neurosurgery who is adjusting her pain meds.  Shelly Martin states she is now on hydrocodone 10/325 and he has increased her neurotin.  I do not have that note  Shelly Martin is concerned over weight loss.   She has lost 10 lbs since February by my scale.  She does states she does not eat nearly as much and she cannot afford the nutritional supplements at the grocery store.  Lots of financial stresses.  [employer] benefits may take a year before any monies are seen.    Lot of tension between mother and daughter.    Lots of verbal tension in the room with me present  Shelly Martin has diarrhea that alternates with constipation.  Diarrhea has improved over last few days.  She uses Miralax when very constipated     Allergies  Allergen Reactions  . Amoxicillin Other (See Comments)    Reaction unknown  . Baclofen Other (See Comments)    "fuzzy in the eyes" and dizzy  . Hctz (Hydrochlorothiazide)     nausea  . Valium (Diazepam) Other (See Comments)    Pt became unresponsive and O2 sats dropped.   Past Medical History  Diagnosis Date  . Hypertension   . Restless legs syndrome     on Requip  . Ulcer causing bleeding and hole in wall of stomach or small intestine   . Gastric ulcer     years ago  . Arthritis     Back, knees  . Constipation   . Urinary bladder calculus   . Urinary incontinence   . Bowel incontinence   . Heart murmur   . Stroke     Mini, no residual   Past Surgical History  Procedure Laterality Date  . Colon resection  50 years ago  . Tonsillectomy    . Uterine fibroid surgery    . Lumbar laminectomy/decompression microdiscectomy  06/25/2011    Procedure: LUMBAR LAMINECTOMY/DECOMPRESSION MICRODISCECTOMY;  Surgeon: Hewitt Shorts, MD;  Location: [facility];  Service: Neurosurgery;  Laterality: Right;  RIGHT  Lumbar Two-Three hemilaminectomy and microdiskectomy  . Vaginal hysterectomy     History   Social History  . Marital Status: Widowed    Spouse Name: N/A    Number of Children: 2  . Years of Education: N/A   Occupational History  . Retired    Social History Main Topics  . Smoking status: Never Smoker   . Smokeless tobacco: Never Used  . Alcohol Use: No     Comment: rarely  . Drug Use: No  . Sexually Active: Not Currently   Other Topics Concern  . Not on file   Social History Narrative  . No narrative on file   Family History  Problem Relation Age of Onset  . Anesthesia problems Neg Hx   . Pancreatic cancer Brother   . Dementia Mother   . Stroke Father    Patient Active Problem List   Diagnosis Date Noted  . Chronic right hip pain 07/05/2012  . Knee pain, acute 06/29/2012  . Vertigo 06/24/2012  . Acute exacerbation of chronic low back pain 06/24/2012  . Lumbar pain with radiation down right leg 06/09/2012  . DJD (degenerative joint disease) of hip 04/29/2012  . History of TIAs 04/17/2012  . S/P  total hysterectomy and bilateral salpingo-oophorectomy 04/17/2012  . Hard of hearing 04/17/2012  . History of anemia 04/17/2012  . RLS (restless legs syndrome) 04/17/2012  . Chronic back pain 04/17/2012  . Spinal stenosis, lumbar 04/17/2012  . Lower back pain 12/20/2011  . Personal history of colonic polyps 10/08/2011  . H/O: CVA (cardiovascular accident) 09/14/2011  . Hypertension 09/14/2011  . Restless legs syndrome 09/14/2011  . Unspecified constipation 08/20/2011  . Urinary incontinence 08/20/2011   Current Outpatient Prescriptions on File Prior to Visit  Medication Sig Dispense Refill  . diclofenac sodium (VOLTAREN) 1 % GEL Apply 2 g topically 4 (four) times daily as needed. Apply to right knee 4 times per day      . gabapentin (NEURONTIN) 100 MG capsule Take 2 capsules (200 mg total) by mouth 4 (four) times daily.  56 capsule  1  . gabapentin (NEURONTIN) 100 MG  capsule Take 1 capsule (100 mg total) by mouth 3 (three) times daily.  90 capsule  0  . HYDROcodone-acetaminophen (NORCO) 7.5-325 MG per tablet Take 1 tablet by mouth every 6 (six) hours as needed for pain.  20 tablet  0  . ibuprofen (ADVIL) 400 MG tablet Take 1 tablet (400 mg total) by mouth every 6 (six) hours as needed for pain.  30 tablet  0  . losartan (COZAAR) 50 MG tablet Take 1 tablet (50 mg total) by mouth 2 (two) times daily.  180 tablet  1  . oxyCODONE-acetaminophen (PERCOCET) 7.5-325 MG per tablet Take 1 tablet by mouth QID. 1 tablet per os at 8:30 AM, 12:30 PM, 4:30 pm and 8:30 PM  120 tablet  0  . polyethylene glycol powder (GLYCOLAX/MIRALAX) powder Take 17 g by mouth at bedtime.      Marland Kitchen rOPINIRole (REQUIP) 2 MG tablet Take 2 tablets (4 mg total) by mouth at bedtime.  180 tablet  1   No current facility-administered medications on file prior to visit.       Review of Systems    see HPI Objective:   Physical Exam Physical Exam  Nursing note and vitals reviewed.  Constitutional: She is oriented to person, place, and time. She appears well-developed and well-nourished.  HENT:  Head: Normocephalic and atraumatic.  Cardiovascular: Normal rate and regular rhythm. Exam reveals no gallop and no friction rub.  No murmur heard.  Pulmonary/Chest: Breath sounds normal. She has no wheezes. She has no rales. ABD  Soft NT/ND  BS+  No HSM  Rectal brown soft stool  No impaction within reach of exam  Neurological: She is alert and oriented to person, place, and time.  Skin: Skin is warm and dry.  Psychiatric: She has a normal mood and affect. Her behavior is normal.       Assessment & Plan:  Weight loss:   Likely multifactorial with chronic pain,  Chronic narcotics and neurontin.  Due to financial stresses, pt repeatedly declines  Imaging.  Will check basic labs today. She tells me she is not worried about any cancer and does not want to be tested for this.  Advised nutritional  supplements multiple times but pt states she cannot afford this  Chronic pain /spinal stenosis/ R hip pain.  Shewill be seeing Dr. Despina Hick and chronic pain managed by Dr. Norville Haggard.  H/O anemia  Get CBC today  Diarrhea seems to not be an active problem now

## 2013-01-04 NOTE — Patient Instructions (Addendum)
See me as needed 

## 2013-01-05 LAB — COMPREHENSIVE METABOLIC PANEL
AST: 18 U/L (ref 0–37)
BUN: 29 mg/dL — ABNORMAL HIGH (ref 6–23)
Calcium: 8.8 mg/dL (ref 8.4–10.5)
Chloride: 103 mEq/L (ref 96–112)
Creat: 0.96 mg/dL (ref 0.50–1.10)

## 2013-01-05 LAB — CBC WITH DIFFERENTIAL/PLATELET
Basophils Absolute: 0 10*3/uL (ref 0.0–0.1)
Eosinophils Relative: 1 % (ref 0–5)
HCT: 30.4 % — ABNORMAL LOW (ref 36.0–46.0)
Lymphocytes Relative: 24 % (ref 12–46)
MCH: 28.2 pg (ref 26.0–34.0)
MCHC: 32.6 g/dL (ref 30.0–36.0)
MCV: 86.6 fL (ref 78.0–100.0)
Monocytes Absolute: 0.5 10*3/uL (ref 0.1–1.0)
RDW: 14.2 % (ref 11.5–15.5)
WBC: 6.7 10*3/uL (ref 4.0–10.5)

## 2013-01-05 LAB — TSH: TSH: 1.4 u[IU]/mL (ref 0.350–4.500)

## 2013-01-06 ENCOUNTER — Telehealth: Payer: Self-pay | Admitting: *Deleted

## 2013-01-06 NOTE — Telephone Encounter (Signed)
Spoke with pt daughter Jan advised her to get an OTC iron supplement for pt  to take daily, or 3 time weekly if she had GI complaints

## 2013-01-06 NOTE — Telephone Encounter (Signed)
Message copied by Mathews Robinsons on Thu Jan 06, 2013  8:36 AM ------      Message from: Raechel Chute D      Created: Wed Jan 05, 2013  8:38 AM       Karen Kitchens            Call [name] and let her know that she is anemic.   Advise her to get the  Least expenisve  OTC iron tablet and take in every day o 3 times a week if the iron bothers her stomach.  Give her a 30 min appt in 3 months to see me ------

## 2013-01-19 ENCOUNTER — Telehealth: Payer: Self-pay | Admitting: *Deleted

## 2013-01-19 MED ORDER — GABAPENTIN 100 MG PO CAPS: 300.0000 mg | ORAL_CAPSULE | Freq: Three times a day (TID) | ORAL | Status: DC

## 2013-01-19 NOTE — Telephone Encounter (Signed)
Jan called she misplaced the Rx for gabapentin 300 mg,  Can you call it in to [facility] [address]?

## 2013-01-19 NOTE — Telephone Encounter (Signed)
error 

## 2013-02-22 ENCOUNTER — Telehealth: Payer: Self-pay | Admitting: *Deleted

## 2013-02-22 NOTE — Telephone Encounter (Signed)
rerturned pt phone call LVM message

## 2013-02-22 NOTE — Telephone Encounter (Signed)
Pt returned call states that she is in a great amount of pain. Pt states that she can have surgery if she chooses to. She would like to talk to Dr Constance Goltz regarding this decision and what she should do

## 2013-02-28 ENCOUNTER — Telehealth: Payer: Self-pay | Admitting: Internal Medicine

## 2013-02-28 NOTE — Telephone Encounter (Signed)
Returning message as pt called office.   I left a message on pts home phone  .  She is to return call

## 2013-03-18 ENCOUNTER — Other Ambulatory Visit: Payer: Self-pay | Admitting: Internal Medicine

## 2013-03-21 ENCOUNTER — Telehealth: Payer: Self-pay | Admitting: *Deleted

## 2013-03-21 NOTE — Telephone Encounter (Signed)
Refill request

## 2013-03-21 NOTE — Telephone Encounter (Signed)
Needs refill of rOPINIRole (REQUIP) 2 MG tablet 3 month supply to  [facility] [address] (THIS IS NEW)

## 2013-04-08 ENCOUNTER — Telehealth: Payer: Self-pay | Admitting: *Deleted

## 2013-04-08 NOTE — Telephone Encounter (Signed)
Pt states that she is in  Suffering terribly with pain and has decided to have the surgery.

## 2013-04-27 ENCOUNTER — Telehealth: Payer: Self-pay | Admitting: *Deleted

## 2013-04-27 NOTE — Telephone Encounter (Signed)
Shelly Martin called stating she needs to know who she can see for her major bowel problems.  She does not want to see Dr Arlyce Dice. She does not like him.

## 2013-04-28 NOTE — Telephone Encounter (Signed)
Bobbbie    See if you can get an appt with one of other GI MD's in office or  We can refer to Dr. Loreta Ave

## 2013-05-06 ENCOUNTER — Encounter (HOSPITAL_COMMUNITY): Payer: Self-pay | Admitting: Emergency Medicine

## 2013-05-06 ENCOUNTER — Emergency Department (HOSPITAL_COMMUNITY): Payer: Medicare Other

## 2013-05-06 ENCOUNTER — Emergency Department (HOSPITAL_COMMUNITY)
Admission: EM | Admit: 2013-05-06 | Discharge: 2013-05-06 | Disposition: A | Discharge status: Home / Self Care | Payer: Medicare Other | Attending: Emergency Medicine | Admitting: Emergency Medicine

## 2013-05-06 DIAGNOSIS — S298XXA Other specified injuries of thorax, initial encounter: Secondary | ICD-10-CM | POA: Insufficient documentation

## 2013-05-06 DIAGNOSIS — R300 Dysuria: Secondary | ICD-10-CM | POA: Insufficient documentation

## 2013-05-06 DIAGNOSIS — Z87442 Personal history of urinary calculi: Secondary | ICD-10-CM | POA: Insufficient documentation

## 2013-05-06 DIAGNOSIS — IMO0002 Reserved for concepts with insufficient information to code with codable children: Secondary | ICD-10-CM | POA: Insufficient documentation

## 2013-05-06 DIAGNOSIS — K5900 Constipation, unspecified: Secondary | ICD-10-CM | POA: Insufficient documentation

## 2013-05-06 DIAGNOSIS — G2581 Restless legs syndrome: Secondary | ICD-10-CM | POA: Insufficient documentation

## 2013-05-06 DIAGNOSIS — R52 Pain, unspecified: Secondary | ICD-10-CM

## 2013-05-06 DIAGNOSIS — R011 Cardiac murmur, unspecified: Secondary | ICD-10-CM | POA: Insufficient documentation

## 2013-05-06 DIAGNOSIS — Z8673 Personal history of transient ischemic attack (TIA), and cerebral infarction without residual deficits: Secondary | ICD-10-CM | POA: Insufficient documentation

## 2013-05-06 DIAGNOSIS — Z8711 Personal history of peptic ulcer disease: Secondary | ICD-10-CM | POA: Insufficient documentation

## 2013-05-06 DIAGNOSIS — M1710 Unilateral primary osteoarthritis, unspecified knee: Secondary | ICD-10-CM | POA: Insufficient documentation

## 2013-05-06 DIAGNOSIS — Y939 Activity, unspecified: Secondary | ICD-10-CM | POA: Insufficient documentation

## 2013-05-06 DIAGNOSIS — R110 Nausea: Secondary | ICD-10-CM

## 2013-05-06 DIAGNOSIS — I10 Essential (primary) hypertension: Secondary | ICD-10-CM | POA: Insufficient documentation

## 2013-05-06 DIAGNOSIS — M1990 Unspecified osteoarthritis, unspecified site: Secondary | ICD-10-CM

## 2013-05-06 DIAGNOSIS — Y929 Unspecified place or not applicable: Secondary | ICD-10-CM | POA: Insufficient documentation

## 2013-05-06 DIAGNOSIS — Z79899 Other long term (current) drug therapy: Secondary | ICD-10-CM | POA: Insufficient documentation

## 2013-05-06 LAB — BASIC METABOLIC PANEL
CO2: 27 mEq/L (ref 19–32)
Calcium: 9.1 mg/dL (ref 8.4–10.5)
Chloride: 97 mEq/L (ref 96–112)
GFR calc Af Amer: 82 mL/min — ABNORMAL LOW (ref >90)
Glucose, Bld: 113 mg/dL — ABNORMAL HIGH (ref 70–99)
Potassium: 3.9 mEq/L (ref 3.5–5.1)
Sodium: 135 mEq/L (ref 135–145)

## 2013-05-06 LAB — URINE MICROSCOPIC-ADD ON

## 2013-05-06 LAB — CBC WITH DIFFERENTIAL/PLATELET
Basophils Absolute: 0 10*3/uL (ref 0.0–0.1)
Eosinophils Relative: 0 % (ref 0–5)
HCT: 31.9 % — ABNORMAL LOW (ref 36.0–46.0)
Hemoglobin: 10.4 g/dL — ABNORMAL LOW (ref 12.0–15.0)
Lymphocytes Relative: 12 % (ref 12–46)
Lymphs Abs: 1 10*3/uL (ref 0.7–4.0)
MCV: 89.4 fL (ref 78.0–100.0)
Monocytes Absolute: 0.3 10*3/uL (ref 0.1–1.0)
Neutro Abs: 6.6 10*3/uL (ref 1.7–7.7)
Neutrophils Relative %: 83 % — ABNORMAL HIGH (ref 43–77)
Platelets: 252 10*3/uL (ref 150–400)
RBC: 3.57 MIL/uL — ABNORMAL LOW (ref 3.87–5.11)
RDW: 13.4 % (ref 11.5–15.5)
WBC: 7.9 10*3/uL (ref 4.0–10.5)

## 2013-05-06 LAB — URINALYSIS, ROUTINE W REFLEX MICROSCOPIC
Glucose, UA: NEGATIVE mg/dL
Protein, ur: NEGATIVE mg/dL
Specific Gravity, Urine: 1.016 (ref 1.005–1.030)
pH: 6.5 (ref 5.0–8.0)

## 2013-05-06 MED ORDER — FENTANYL 12 MCG/HR TD PT72: 12.5000 ug | MEDICATED_PATCH | TRANSDERMAL | Status: DC

## 2013-05-06 MED ORDER — HYDROMORPHONE HCL PF 1 MG/ML IJ SOLN
0.5000 mg | Freq: Once | INTRAMUSCULAR | Status: AC
  Administered 2013-05-06: 0.5 mg via INTRAVENOUS
  Filled 2013-05-06: qty 1

## 2013-05-06 MED ORDER — INFLUENZA VAC SPLIT QUAD 0.5 ML IM SUSP: 0.5000 mL | INTRAMUSCULAR | Status: DC

## 2013-05-06 MED ORDER — SODIUM CHLORIDE 0.9 % IV BOLUS (SEPSIS)
500.0000 mL | Freq: Once | INTRAVENOUS | Status: AC
  Administered 2013-05-06: 500 mL via INTRAVENOUS

## 2013-05-06 MED ORDER — HYDROMORPHONE HCL PF 1 MG/ML IJ SOLN
1.0000 mg | Freq: Once | INTRAMUSCULAR | Status: AC
  Administered 2013-05-06: 1 mg via INTRAVENOUS
  Filled 2013-05-06: qty 1

## 2013-05-06 NOTE — ED Notes (Signed)
Awaiting for [facility] for transport.

## 2013-05-06 NOTE — ED Notes (Signed)
Pt daughter Hildred Laser number is [phone]. Would like to notified when patient is ready for d/c.

## 2013-05-06 NOTE — ED Provider Notes (Signed)
CSN: [id number]     Arrival date & time 05/06/13  1054 History   First MD Initiated Contact with Patient 05/06/13 1101     Chief Complaint  Patient presents with  . Generalized Body Aches  . Nausea   (Consider location/radiation/quality/duration/timing/severity/associated sxs/prior Treatment) HPI Comments: 79 yo female with arthritis presents with body pains and nausea. Lives at home with daughter.  Pt has had worsening lower back pain and knee pains.  Pt fell last week and hit left chest wall.  Pain with palpation.  Nausea the past few days since fentanyl patch doubled.  Pain ache, constant.  No vomiting.  Bladder issues for years.  No fevers.   The history is provided by the patient.    Past Medical History  Diagnosis Date  . Hypertension   . Restless legs syndrome     on Requip  . Ulcer causing bleeding and hole in wall of stomach or small intestine   . Gastric ulcer     years ago  . Arthritis     Back, knees  . Constipation   . Urinary bladder calculus   . Urinary incontinence   . Bowel incontinence   . Heart murmur   . Stroke     Mini, no residual   Past Surgical History  Procedure Laterality Date  . Colon resection  50 years ago  . Tonsillectomy    . Uterine fibroid surgery    . Lumbar laminectomy/decompression microdiscectomy  06/25/2011    Procedure: LUMBAR LAMINECTOMY/DECOMPRESSION MICRODISCECTOMY;  Surgeon: Hewitt Shorts, MD;  Location: [facility];  Service: Neurosurgery;  Laterality: Right;  RIGHT Lumbar Two-Three hemilaminectomy and microdiskectomy  . Vaginal hysterectomy     Family History  Problem Relation Age of Onset  . Anesthesia problems Neg Hx   . Pancreatic cancer Brother   . Dementia Mother   . Stroke Father    History  Substance Use Topics  . Smoking status: Never Smoker   . Smokeless tobacco: Never Used  . Alcohol Use: No     Comment: rarely   OB History   Grav Para Term Preterm Abortions TAB SAB Ect Mult Living                  Review of Systems  Constitutional: Negative for fever and chills.  HENT: Negative for congestion.   Eyes: Negative for visual disturbance.  Respiratory: Negative for shortness of breath.   Cardiovascular: Negative for chest pain.  Gastrointestinal: Negative for vomiting and abdominal pain.  Genitourinary: Positive for dysuria. Negative for flank pain.  Musculoskeletal: Positive for arthralgias and back pain. Negative for neck pain and neck stiffness.  Skin: Negative for rash.  Neurological: Negative for light-headedness and headaches.    Allergies  Amoxicillin; Baclofen; Hctz; and Valium  Home Medications   Current Outpatient Rx  Name  Route  Sig  Dispense  Refill  . fentaNYL (DURAGESIC - DOSED MCG/HR) 25 MCG/HR patch   Transdermal   Place 25 mcg onto the skin every 3 (three) days.         Marland Kitchen gabapentin (NEURONTIN) 300 MG capsule   Oral   Take 300-600 mg by mouth 3 (three) times daily. Take 300mg  in the morning, 300mg  at 12, and 2 tablets (600mg ) in the evening.         Marland Kitchen HYDROcodone-acetaminophen (NORCO) 10-325 MG per tablet   Oral   Take 1 tablet by mouth every 6 (six) hours as needed for pain.         Marland Kitchen  ibuprofen (ADVIL) 400 MG tablet   Oral   Take 1 tablet (400 mg total) by mouth every 6 (six) hours as needed for pain.   30 tablet   0   . losartan (COZAAR) 50 MG tablet   Oral   Take 1 tablet (50 mg total) by mouth 2 (two) times daily.   180 tablet   1   . polyethylene glycol powder (GLYCOLAX/MIRALAX) powder   Oral   Take 17 g by mouth daily as needed for mild constipation.          Marland Kitchen rOPINIRole (REQUIP) 2 MG tablet   Oral   Take 2-4 mg by mouth 2 (two) times daily as needed (restless leg).          BP 165/68  Pulse 81  Temp(Src) 98 F (36.7 C) (Oral)  Resp 16  SpO2 96% Physical Exam  Nursing note and vitals reviewed. Constitutional: She is oriented to person, place, and time. She appears well-developed and well-nourished.  HENT:  Head:  Normocephalic and atraumatic.  Eyes: Conjunctivae are normal. Right eye exhibits no discharge. Left eye exhibits no discharge.  Neck: Normal range of motion. Neck supple. No tracheal deviation present.  Cardiovascular: Normal rate and regular rhythm.   Pulmonary/Chest: Effort normal and breath sounds normal.  Abdominal: Soft. She exhibits no distension. There is no tenderness. There is no guarding.  Musculoskeletal: She exhibits tenderness (left anterior lower ribs, knees bilateral with flexion.  Full rom of hips/ knees, no signs of joint infection, no focal midline vertebral pain, mild paraspiinal tenderness). She exhibits no edema.  Neurological: She is alert and oriented to person, place, and time.  Skin: Skin is warm. No rash noted.  Psychiatric: She has a normal mood and affect.    ED Course  Procedures (including critical care time) Labs Review Labs Reviewed  URINALYSIS, ROUTINE W REFLEX MICROSCOPIC - Abnormal; Notable for the following:    Hgb urine dipstick TRACE (*)    Leukocytes, UA SMALL (*)    All other components within normal limits  CBC WITH DIFFERENTIAL - Abnormal; Notable for the following:    RBC 3.57 (*)    Hemoglobin 10.4 (*)    HCT 31.9 (*)    Neutrophils Relative % 83 (*)    All other components within normal limits  BASIC METABOLIC PANEL - Abnormal; Notable for the following:    Glucose, Bld 113 (*)    BUN 26 (*)    GFR calc non Af Amer 70 (*)    GFR calc Af Amer 82 (*)    All other components within normal limits  URINE MICROSCOPIC-ADD ON   Imaging Review No results found.  EKG Interpretation   None       MDM   1. Nausea   2. Osteoarthritis   3. Inadequate pain control    Concern for worsening arthritis With fall, CXR and lumbar xray. Neuro intact. Pt well appearing otherwise.  Pain meds, UA. Pt well appearing in ED.   Recheck, no improvement of pani. Repeat pain meds.   No acute findings on workup, mild dehydration, arthritis. Fluids  given.  Discussed plan with pt, she is frustrated as she is already on fentnayl patch and po narcotics and gabapentin yet Pain persists.  Pt has a pain doctor. She is not happy living with this pain although she is realistic that there is not a quick cure with her age and sxs.  Paged hospitalist to discuss observation for pain control  and change of her pain meds po. Hospitalist came down to ED to assist with disposition, oral med adjustment and close follow up outpatient.  Signed out for final dispo.      Enid Skeens, MD 05/06/13 [phone]

## 2013-05-06 NOTE — ED Notes (Signed)
Pt c/o pain 9/10 in lower back. Also c/o pain to l lower ribcage after fall last weekl. Family at bedside.

## 2013-05-06 NOTE — ED Notes (Signed)
Bed: WA21 Expected date:  Expected time:  Means of arrival:  Comments: EMS-chronic hip pain-nausea

## 2013-05-06 NOTE — Progress Notes (Signed)
Patient has home meds that need to be locked up in the Pharmacy

## 2013-05-06 NOTE — ED Notes (Signed)
Patient soiled self and bed while on bedpan.  Patient cleaned and linens changed.  Patient resting in semi-fowler.s position.  Call bell within reach.

## 2013-05-06 NOTE — ED Notes (Signed)
Per EMS pt coming from home. Hx of chronic body pain, now sts pain is severe especially r hip and l knee. Pt given 4 mg of zofran for nausea en route. VSS.

## 2013-05-06 NOTE — ED Provider Notes (Signed)
Care assumed at sign out from Dr. Jodi Mourning. Patient has chronic pain and is in more pain. Sign out to f/u medicine consult. Dr. Mahala Menghini evaluated patient and doesn't think she needs admission. Recommend increase fentanyl patch and f/u with PMD and orthopedic doctor.   Richardean Canal, MD 05/06/13 [phone]

## 2013-05-06 NOTE — ED Notes (Signed)
[facility] called for transport.  

## 2013-05-06 NOTE — Consult Note (Signed)
[facility] Medical Consultation  Shelly Martin ZOX:[id number] DOB: July 03, 1918 DOA: 05/06/2013 PCP: Levon Hedger, MD   Requesting physician: Jodi Mourning Date of consultation: 05/06/13 Reason for consultation: Pain   1. Recommendation-increase fentanyl patch from 75 micrograms Q. 72 hours 2 87.5 mcg (in other words add 12.5 mcg patch) I'm hesitant to add another NSAID as she has had a history of gastric ulcer in the past but another consideration would be to change her Vicodin to Vicoprofen which may offer more analgesia. I discussed in detail her options and I think they're limited at this time to surgery vs. Not surgery and I encouraged her to speak with her primary care physician and get another opinion from Dr. Despina Hick regarding surgery--surgeries none in the option for her, I would recommend that she sees her chronic pain specialist to help adjust her medications and I did explain to her that that is not my specialty and I cannot actively manage this in the hospital without subspecialty help-patient understood clearly plan of care  Care relinquished to Dr. Silverio Lay in the emergency room   Chief Complaint: multiple pain issues  HPI:  Pleasant atrial Caucasian female, known history TIA 11/16/09, known history multilevel degenerative disc disease status post lumbar laminectomy 06/25/2011, colon resection 50 years ago, hypertension, restless leg syndrome, gastric ulcer multiple years ago presents to the emergency room with chronic back pain lasting for the past 3 years. This is a new complaint. She is being evaluated extensively by both her neurosurgeon as well as Dr. Norville Haggard of neurosurgery.  She he was seen by Dr. Despina Hick in the past as well and recommended to followup for potential right hip replacement which she deferred. She voices significant concern about her going to surgery her advanced age and is worried about the outcome of the same. She has no known heart disease although she does  have a TIA and hypertension which is a moderate risk, risk of surgery of this nature orthopedic as moderate as well. Otherwise she is very healthy lady. She up to 3 years ago was driving her own car and was able to significant ADLs currently gets around with help of a walker which she does not like to use. She lives with her daughter at home who helps her out significantly. She was able to ambulate to the bathroom with minimal help from nursing although she seemed a little bit unsteady. She ambulated about 25 feet in the emergency room   Review of Systems:  No dysuria, no shortness breath no chest pain no blurred vision no double vision no falls no weak as  Past Medical History  Diagnosis Date  . Hypertension   . Restless legs syndrome     on Requip  . Ulcer causing bleeding and hole in wall of stomach or small intestine   . Gastric ulcer     years ago  . Arthritis     Back, knees  . Constipation   . Urinary bladder calculus   . Urinary incontinence   . Bowel incontinence   . Heart murmur   . Stroke     Mini, no residual   Past Surgical History  Procedure Laterality Date  . Colon resection  50 years ago  . Tonsillectomy    . Uterine fibroid surgery    . Lumbar laminectomy/decompression microdiscectomy  06/25/2011    Procedure: LUMBAR LAMINECTOMY/DECOMPRESSION MICRODISCECTOMY;  Surgeon: Hewitt Shorts, MD;  Location: [facility];  Service: Neurosurgery;  Laterality: Right;  RIGHT Lumbar Two-Three hemilaminectomy and  microdiskectomy  . Vaginal hysterectomy     Social History:  reports that she has never smoked. She has never used smokeless tobacco. She reports that she does not drink alcohol or use illicit drugs.  Allergies  Allergen Reactions  . Amoxicillin Other (See Comments)    Reaction unknown  . Baclofen Other (See Comments)    "fuzzy in the eyes" and dizzy  . Hctz [Hydrochlorothiazide]     nausea  . Valium [Diazepam] Other (See Comments)    Pt became  unresponsive and O2 sats dropped.   Family History  Problem Relation Age of Onset  . Anesthesia problems Neg Hx   . Pancreatic cancer Brother   . Dementia Mother   . Stroke Father     Prior to Admission medications   Medication Sig Start Date End Date Taking? Authorizing Provider  fentaNYL (DURAGESIC - DOSED MCG/HR) 25 MCG/HR patch Place 25 mcg onto the skin every 3 (three) days.   Yes Historical Provider, MD  gabapentin (NEURONTIN) 300 MG capsule Take 300-600 mg by mouth 3 (three) times daily. Take 300mg  in the morning, 300mg  at 12, and 2 tablets (600mg ) in the evening.   Yes Historical Provider, MD  HYDROcodone-acetaminophen (NORCO) 10-325 MG per tablet Take 1 tablet by mouth every 6 (six) hours as needed for pain.   Yes Historical Provider, MD  ibuprofen (ADVIL) 400 MG tablet Take 1 tablet (400 mg total) by mouth every 6 (six) hours as needed for pain. 06/26/12  Yes Sorin Luanne Bras, MD  losartan (COZAAR) 50 MG tablet Take 1 tablet (50 mg total) by mouth 2 (two) times daily. 12/29/12  Yes Kendrick Ranch, MD  polyethylene glycol powder (GLYCOLAX/MIRALAX) powder Take 17 g by mouth daily as needed for mild constipation.    Yes Historical Provider, MD  rOPINIRole (REQUIP) 2 MG tablet Take 2-4 mg by mouth 2 (two) times daily as needed (restless leg).   Yes Historical Provider, MD   Physical Exam: Blood pressure 169/68, pulse 81, temperature 98 F (36.7 C), temperature source Oral, resp. rate 16, SpO2 96.00%. Filed Vitals:   05/06/13 1228  BP: 169/68  Pulse:   Temp:   Resp: 16     General:  EOMI, NCAT, no pallor very alert oriented  Eyes: no pallor no icterus  ENT: throat clear  Neck: soft supple  Cardiovascular: S1-S2 no murmur rub or gallop  Respiratory: clinically clear  Abdomen: soft nontender nondistended  Skin: no lower extremity edema  Musculoskeletal: range of motion intact  Psychiatric: euthymic  Neurologic: vital 5 power, reflexes 2/3, sensory intact  bilaterally to cool touch and is intact power in dorsi and plantar flexors is excellent  Labs on Admission:  Basic Metabolic Panel:  Recent Labs Lab 05/06/13 1327  NA 135  K 3.9  CL 97  CO2 27  GLUCOSE 113*  BUN 26*  CREATININE 0.78  CALCIUM 9.1   Liver Function Tests: No results found for this basename: AST, ALT, ALKPHOS, BILITOT, PROT, ALBUMIN,  in the last 168 hours No results found for this basename: LIPASE, AMYLASE,  in the last 168 hours No results found for this basename: AMMONIA,  in the last 168 hours CBC:  Recent Labs Lab 05/06/13 1327  WBC 7.9  NEUTROABS 6.6  HGB 10.4*  HCT 31.9*  MCV 89.4  PLT 252   Cardiac Enzymes: No results found for this basename: CKTOTAL, CKMB, CKMBINDEX, TROPONINI,  in the last 168 hours BNP: No components found with this basename: POCBNP,  CBG: No results found for this basename: GLUCAP,  in the last 168 hours  Radiological Exams on Admission: Dg Chest 2 View  05/06/2013   CLINICAL DATA:  Generalized body aches, nausea  EXAM: CHEST  2 VIEW  COMPARISON:  12/20/2011  FINDINGS: Low lung volumes. Cardiac silhouette is moderately enlarged. Aorta is tortuous and ectatic. Small prominence of interstitial markings, chronic. No focal reason consolidation or focal infiltrates. Degenerative changes appreciated involving the humeral heads.  IMPRESSION: Chronic changes without evidence of acute abnormalities.   Electronically Signed   By: Salome Holmes M.D.   On: 05/06/2013 12:58   Dg Lumbar Spine Complete  05/06/2013   CLINICAL DATA:  Generalized body aches.  EXAM: LUMBAR SPINE - COMPLETE 4+ VIEW  COMPARISON:  October 28, 2011.  FINDINGS: Mild levoscoliosis of lumbar spine is noted. Moderate to severe multilevel degenerative disc disease is noted throughout the lumbar spine. Grade 1 anterolisthesis of L3-4 is noted which is unchanged compared to prior exam. No fracture is noted.  IMPRESSION: Moderate to severe multilevel degenerative disc disease is  noted throughout the lumbar spine. No acute abnormality is noted.   Electronically Signed   By: Roque Lias M.D.   On: 05/06/2013 13:05    EKG: Independently reviewed. None performed  Time spent: 55 minutes  Mahala Menghini [facility] Pager [phone]  If 7PM-7AM, please contact night-coverage [email or web address] Password [facility] 05/06/2013, 5:09 PM

## 2013-05-10 ENCOUNTER — Telehealth: Payer: Self-pay | Admitting: *Deleted

## 2013-05-10 NOTE — Telephone Encounter (Signed)
Called pt and daughter answered phone     When I asked how her mother was doing she states "she is in pain and has diarrhea,  There is nothing anybody can do and I am not taking her anywhere to pay another co-pay for someone to tell me nothing is wrong"   " She has diarrhea  - big whoop"  She has seen [facility] for her diarrhea.   She can empirically try Immodium and advised to see her GI MD if worsening.

## 2013-05-10 NOTE — Telephone Encounter (Signed)
Shelly Martin called today.  She is upset that she is unable to control her bowels.  She said she is now having green/yellow diarrhea, and she is losing weight.  She needs answers as to what she can do.

## 2013-05-10 NOTE — Telephone Encounter (Signed)
Addendum to phone call 12/16    There is extreme tension between mother and daughter.  I have been told by Karen Kitchens that when she has spoken with daughter in the past,  Daughter states  "I am not taking her anywhere because my mother won't use her walker"

## 2013-06-03 ENCOUNTER — Emergency Department (HOSPITAL_COMMUNITY)
Admission: EM | Admit: 2013-06-03 | Discharge: 2013-06-03 | Disposition: A | Discharge status: Home / Self Care | Payer: Medicare Other | Attending: Emergency Medicine | Admitting: Emergency Medicine

## 2013-06-03 ENCOUNTER — Emergency Department (HOSPITAL_COMMUNITY): Payer: Medicare Other

## 2013-06-03 ENCOUNTER — Encounter (HOSPITAL_COMMUNITY): Payer: Self-pay | Admitting: Emergency Medicine

## 2013-06-03 DIAGNOSIS — G8929 Other chronic pain: Secondary | ICD-10-CM | POA: Insufficient documentation

## 2013-06-03 DIAGNOSIS — I10 Essential (primary) hypertension: Secondary | ICD-10-CM | POA: Insufficient documentation

## 2013-06-03 DIAGNOSIS — R296 Repeated falls: Secondary | ICD-10-CM | POA: Insufficient documentation

## 2013-06-03 DIAGNOSIS — Y9301 Activity, walking, marching and hiking: Secondary | ICD-10-CM | POA: Insufficient documentation

## 2013-06-03 DIAGNOSIS — S1093XA Contusion of unspecified part of neck, initial encounter: Principal | ICD-10-CM

## 2013-06-03 DIAGNOSIS — Z87448 Personal history of other diseases of urinary system: Secondary | ICD-10-CM | POA: Insufficient documentation

## 2013-06-03 DIAGNOSIS — R42 Dizziness and giddiness: Secondary | ICD-10-CM | POA: Insufficient documentation

## 2013-06-03 DIAGNOSIS — G2581 Restless legs syndrome: Secondary | ICD-10-CM | POA: Insufficient documentation

## 2013-06-03 DIAGNOSIS — R011 Cardiac murmur, unspecified: Secondary | ICD-10-CM | POA: Insufficient documentation

## 2013-06-03 DIAGNOSIS — W19XXXA Unspecified fall, initial encounter: Secondary | ICD-10-CM

## 2013-06-03 DIAGNOSIS — S0083XA Contusion of other part of head, initial encounter: Secondary | ICD-10-CM

## 2013-06-03 DIAGNOSIS — M25559 Pain in unspecified hip: Secondary | ICD-10-CM | POA: Insufficient documentation

## 2013-06-03 DIAGNOSIS — Y92009 Unspecified place in unspecified non-institutional (private) residence as the place of occurrence of the external cause: Secondary | ICD-10-CM | POA: Insufficient documentation

## 2013-06-03 DIAGNOSIS — M129 Arthropathy, unspecified: Secondary | ICD-10-CM | POA: Insufficient documentation

## 2013-06-03 DIAGNOSIS — Z79899 Other long term (current) drug therapy: Secondary | ICD-10-CM | POA: Insufficient documentation

## 2013-06-03 DIAGNOSIS — M1710 Unilateral primary osteoarthritis, unspecified knee: Secondary | ICD-10-CM | POA: Insufficient documentation

## 2013-06-03 DIAGNOSIS — Z8673 Personal history of transient ischemic attack (TIA), and cerebral infarction without residual deficits: Secondary | ICD-10-CM | POA: Insufficient documentation

## 2013-06-03 DIAGNOSIS — S0003XA Contusion of scalp, initial encounter: Secondary | ICD-10-CM | POA: Insufficient documentation

## 2013-06-03 DIAGNOSIS — IMO0002 Reserved for concepts with insufficient information to code with codable children: Secondary | ICD-10-CM

## 2013-06-03 DIAGNOSIS — Z8719 Personal history of other diseases of the digestive system: Secondary | ICD-10-CM | POA: Insufficient documentation

## 2013-06-03 LAB — URINALYSIS, ROUTINE W REFLEX MICROSCOPIC
BILIRUBIN URINE: NEGATIVE
GLUCOSE, UA: NEGATIVE mg/dL
HGB URINE DIPSTICK: NEGATIVE
Ketones, ur: NEGATIVE mg/dL
Leukocytes, UA: NEGATIVE
Nitrite: NEGATIVE
Protein, ur: NEGATIVE mg/dL
SPECIFIC GRAVITY, URINE: 1.01 (ref 1.005–1.030)
Urobilinogen, UA: 0.2 mg/dL (ref 0.0–1.0)
pH: 8 (ref 5.0–8.0)

## 2013-06-03 MED ORDER — HYDROCODONE-ACETAMINOPHEN 5-325 MG PO TABS
1.0000 | ORAL_TABLET | Freq: Once | ORAL | Status: AC
  Administered 2013-06-03: 1 via ORAL
  Filled 2013-06-03: qty 1

## 2013-06-03 MED ORDER — IBUPROFEN 800 MG PO TABS
800.0000 mg | ORAL_TABLET | Freq: Once | ORAL | Status: AC
  Administered 2013-06-03: 800 mg via ORAL
  Filled 2013-06-03: qty 1

## 2013-06-03 MED ORDER — GABAPENTIN 600 MG PO TABS
300.0000 mg | ORAL_TABLET | Freq: Once | ORAL | Status: DC
  Filled 2013-06-03: qty 0.5

## 2013-06-03 MED ORDER — GABAPENTIN 300 MG PO CAPS
300.0000 mg | ORAL_CAPSULE | Freq: Once | ORAL | Status: AC
  Administered 2013-06-03: 300 mg via ORAL
  Filled 2013-06-03: qty 1

## 2013-06-03 NOTE — Discharge Instructions (Signed)
Ice packs to the bruised area. Return to the ED for any problems listed on the head injury sheet or if you feel worse.    Head Injury, Adult A head injury happens when the head is hit really hard. A head injury may cause sleepiness, headache, throwing up (vomiting), and problems seeing. If the head injury is really bad, you may need to stay in the hospital. [place]  Have someone with you for the first 24 hours. This person should wake you up every 02-03 hours to check on your condition.  Only drink water or clear fluid for the rest of the day. Then, go back to your regular diet.  Only take medicines as told by your doctor. Do not take aspirin.  Do not drink alcohol for 2 days.  Do not take medicines that help your relax (sedatives) for 2 days. Side effects may happen for up to 7 to 10 days. Watch for new problems. GET HELP RIGHT AWAY IF:   You are confused or sleepy.  You cannot be woken up.  You feel sick to your stomach (nauseous) or keep throwing up.  Your dizziness or unsteadiness is get worse, or your cannot walk.  You start to shake (convulse) or pass out (faint).  You have very bad, lasting headaches that are not helped by medicine.  You cannot use your arms or legs like normal.  You have clear or bloody fluid coming from your nose or ears. MAKE SURE YOU:   Understand these instructions.  Will watch your condition.  Will get help right away if you are not doing well or get worse. Document Released: 04/24/2008 Document Revised: 08/04/2011 Document Reviewed: 03/28/2009 [facility] Patient Information 2014 [place], [place].  Contusion A contusion is a deep bruise. Contusions are the result of an injury that caused bleeding under the skin. The contusion may turn blue, purple, or yellow. Minor injuries will give you a painless contusion, but more severe contusions may stay painful and swollen for a few weeks.  CAUSES  A contusion is usually caused by a blow, trauma, or  direct force to an area of the body. SYMPTOMS   Swelling and redness of the injured area.  Bruising of the injured area.  Tenderness and soreness of the injured area.  Pain. DIAGNOSIS  The diagnosis can be made by taking a history and physical exam. An X-ray, CT scan, or MRI may be needed to determine if there were any associated injuries, such as fractures. TREATMENT  Specific treatment will depend on what area of the body was injured. In general, the best treatment for a contusion is resting, icing, elevating, and applying cold compresses to the injured area. Over-the-counter medicines may also be recommended for pain control. Ask your caregiver what the best treatment is for your contusion. HOME CARE INSTRUCTIONS   Put ice on the injured area.  Put ice in a plastic bag.  Place a towel between your skin and the bag.  Leave the ice on for 15-20 minutes, 03-04 times a day.  Only take over-the-counter or prescription medicines for pain, discomfort, or fever as directed by your caregiver. Your caregiver may recommend avoiding anti-inflammatory medicines (aspirin, ibuprofen, and naproxen) for 48 hours because these medicines may increase bruising.  Rest the injured area.  If possible, elevate the injured area to reduce swelling. SEEK IMMEDIATE MEDICAL CARE IF:   You have increased bruising or swelling.  You have pain that is getting worse.  Your swelling or pain is  not relieved with medicines. MAKE SURE YOU:   Understand these instructions.  Will watch your condition.  Will get help right away if you are not doing well or get worse. Document Released: 02/19/2005 Document Revised: 08/04/2011 Document Reviewed: 03/17/2011 [facility] Patient Information 2014 [place], [place].

## 2013-06-03 NOTE — ED Notes (Signed)
Patient daughter Jan notified that patient was ready to be discharged. [facility] called for pick up. Patient eating her sand which at this time.

## 2013-06-03 NOTE — ED Provider Notes (Signed)
CSN: [id number]     Arrival date & time 06/03/13  0915 History   First MD Initiated Contact with Patient 06/03/13 [phone]     Chief Complaint  Patient presents with  . Back Pain  . Fall   (Consider location/radiation/quality/duration/timing/severity/associated sxs/prior Treatment) HPI Patient reports she has dizzy spells and she does fall. She states yesterday she bent forward to pick up something and she fell. She denies any injury from the fall yesterday. She states today she was walking from her room into the bathroom and she got dizzy and she fell forward and landed face down on the floor. She states her daughter was just coming into her room and helped her get up.  She states she hit her left face. She denies loss of consciousness. She denies any pain in her neck. She feels the dizziness is related to her blood pressure being high.  Patient reports chronic pain in her right hip down to her knee. She has been assessed by Dr. Wynelle Link and they are discussing whether she could tolerate having a hip replacement done. She also reports she felt a few weeks ago and fell into the bathtub. She states she was able to get herself out.  Patient also complains of frequency and is followed by Dr. Matilde Sprang with urology. She states the pills he prescribed do not help.   PCP Dr Coralyn Mark  Past Medical History  Diagnosis Date  . Hypertension   . Restless legs syndrome     on Requip  . Ulcer causing bleeding and hole in wall of stomach or small intestine   . Gastric ulcer     years ago  . Arthritis     Back, knees  . Constipation   . Urinary bladder calculus   . Urinary incontinence   . Bowel incontinence   . Heart murmur   . Stroke     Mini, no residual   Past Surgical History  Procedure Laterality Date  . Colon resection  50 years ago  . Tonsillectomy    . Uterine fibroid surgery    . Lumbar laminectomy/decompression microdiscectomy  06/25/2011    Procedure: LUMBAR  LAMINECTOMY/DECOMPRESSION MICRODISCECTOMY;  Surgeon: Hosie Spangle, MD;  Location: [facility];  Service: Neurosurgery;  Laterality: Right;  RIGHT Lumbar Two-Three hemilaminectomy and microdiskectomy  . Vaginal hysterectomy     Family History  Problem Relation Age of Onset  . Anesthesia problems Neg Hx   . Pancreatic cancer Brother   . Dementia Mother   . Stroke Father    History  Substance Use Topics  . Smoking status: Never Smoker   . Smokeless tobacco: Never Used  . Alcohol Use: No     Comment: rarely   Lives at home Lives with daughter Exposed to secondhand smoke   OB History   Grav Para Term Preterm Abortions TAB SAB Ect Mult Living                 Review of Systems  All other systems reviewed and are negative.    Allergies  Amoxicillin; Baclofen; Hctz; and Valium  Home Medications   Current Outpatient Rx  Name  Route  Sig  Dispense  Refill  . fentaNYL (DURAGESIC - DOSED MCG/HR) 25 MCG/HR patch   Transdermal   Place 25 mcg onto the skin every 3 (three) days.         Marland Kitchen gabapentin (NEURONTIN) 300 MG capsule   Oral   Take 300-600 mg by mouth 3 (three)  times daily. Take 300mg  in the morning, 300mg  at 12, and 2 tablets (600mg ) in the evening.         Marland Kitchen HYDROcodone-acetaminophen (NORCO) 10-325 MG per tablet   Oral   Take 1 tablet by mouth every 6 (six) hours as needed for pain.         Marland Kitchen ibuprofen (ADVIL,MOTRIN) 200 MG tablet   Oral   Take 800 mg by mouth every 6 (six) hours as needed.         Marland Kitchen losartan (COZAAR) 50 MG tablet   Oral   Take 1 tablet (50 mg total) by mouth 2 (two) times daily.   180 tablet   1   . polyethylene glycol powder (GLYCOLAX/MIRALAX) powder   Oral   Take 17 g by mouth daily as needed for mild constipation.          Marland Kitchen rOPINIRole (REQUIP) 2 MG tablet   Oral   Take 2-4 mg by mouth 2 (two) times daily as needed (restless leg).          BP 181/76  Pulse 68  Temp(Src) 98 F (36.7 C) (Oral)  Resp 16  SpO2  99%  Vital signs normal   Physical Exam  Nursing note and vitals reviewed. Constitutional: She is oriented to person, place, and time. She appears well-developed and well-nourished.  Non-toxic appearance. She does not appear ill. No distress.  Pleasant female who appears younger than her stated age  HENT:  Head: Normocephalic.    Right Ear: External ear normal.  Left Ear: External ear normal.  Nose: Nose normal. No mucosal edema or rhinorrhea.  Mouth/Throat: Oropharynx is clear and moist and mucous membranes are normal. No dental abscesses or uvula swelling.  The patient has faint redness and tenderness over her left cheek and lateral zygomatic arch.  Eyes: Conjunctivae and EOM are normal. Pupils are equal, round, and reactive to light.  Neck: Normal range of motion and full passive range of motion without pain. Neck supple.  Cardiovascular: Normal rate, regular rhythm and normal heart sounds.  Exam reveals no gallop and no friction rub.   No murmur heard. Pulmonary/Chest: Effort normal and breath sounds normal. No respiratory distress. She has no wheezes. She has no rhonchi. She has no rales. She exhibits no tenderness and no crepitus.  Abdominal: Soft. Normal appearance and bowel sounds are normal. She exhibits no distension. There is no tenderness. There is no rebound and no guarding.  Musculoskeletal: Normal range of motion. She exhibits no edema and no tenderness.  Moves all extremities well.   Neurological: She is alert and oriented to person, place, and time. She has normal strength. No cranial nerve deficit.  Skin: Skin is warm, dry and intact. No rash noted. No erythema. No pallor.  Psychiatric: She has a normal mood and affect. Her speech is normal and behavior is normal. Her mood appears not anxious.    ED Course  Procedures (including critical care time)  Medications  HYDROcodone-acetaminophen (NORCO/VICODIN) 5-325 MG per tablet 1 tablet (1 tablet Oral Given 06/03/13 1226)   ibuprofen (ADVIL,MOTRIN) tablet 800 mg (800 mg Oral Given 06/03/13 1226)  gabapentin (NEURONTIN) capsule 300 mg (300 mg Oral Given 06/03/13 1226)    Pt's nurse reported to me that patient hurt her back today with her fall which she did not relay to me during my interview. When I questioned her again about it she said her back pain she has today is her chronic back pain and nothing  is new today. She was given her usual morning pain medications while waiting for her CT results.   Urinalysis done because of patient's complaint of frequency.  Labs Review Results for orders placed during the hospital encounter of 06/03/13  URINALYSIS, ROUTINE W REFLEX MICROSCOPIC      Result Value Range   Color, Urine YELLOW  YELLOW   APPearance CLEAR  CLEAR   Specific Gravity, Urine 1.010  1.005 - 1.030   pH 8.0  5.0 - 8.0   Glucose, UA NEGATIVE  NEGATIVE mg/dL   Hgb urine dipstick NEGATIVE  NEGATIVE   Bilirubin Urine NEGATIVE  NEGATIVE   Ketones, ur NEGATIVE  NEGATIVE mg/dL   Protein, ur NEGATIVE  NEGATIVE mg/dL   Urobilinogen, UA 0.2  0.0 - 1.0 mg/dL   Nitrite NEGATIVE  NEGATIVE   Leukocytes, UA NEGATIVE  NEGATIVE    Laboratory interpretation all normal       Imaging Review Ct Head Wo Contrast Ct Maxillofacial Wo Cm  06/03/2013   CLINICAL DATA:  Left facial bruising status post fall.  EXAM: CT HEAD WITHOUT CONTRAST  CT MAXILLOFACIAL WITHOUT CONTRAST  TECHNIQUE: Multidetector CT imaging of the head and maxillofacial structures were performed using the standard protocol without intravenous contrast. Multiplanar CT image reconstructions of the maxillofacial structures were also generated.  COMPARISON:  Head CT 06/24/2012.  FINDINGS: CT HEAD FINDINGS  There is no evidence of acute intracranial hemorrhage, mass lesion, brain edema or extra-axial fluid collection. The ventricles and subarachnoid spaces are prominent but stable, consistent with age-appropriate atrophy. There is a stable 12 mm left frontal  parafalcine meningioma on image number 15. There is no CT evidence of acute cortical infarction. Chronic periventricular white matter disease appears unchanged.  The visualized paranasal sinuses, mastoid air cells and middle ears are clear. The calvarium is intact.  CT MAXILLOFACIAL FINDINGS  There is no evidence of acute facial fracture. The paranasal sinuses are well aerated without air-fluid levels. Bilateral concha bullosa are noted. The mastoid air cells and middle ears are clear. Multilevel cervical spondylosis is noted.  There is no evidence of orbital hematoma or globe injury. Patient has undergone probable scleral banding procedure bilaterally.  IMPRESSION: 1. No acute intracranial or facial findings demonstrated. 2. No evidence of facial or calvarial fracture. 3. Stable chronic small vessel ischemic changes and left parafalcine meningioma.   Electronically Signed   By: Camie Patience M.D.   On: 06/03/2013 11:56      Dg Chest 2 View  05/06/2013  IMPRESSION: Chronic changes without evidence of acute abnormalities.   Electronically Signed   By: Margaree Mackintosh M.D.   On: 05/06/2013 12:58   Dg Lumbar Spine Complete  05/06/2013   IMPRESSION: Moderate to severe multilevel degenerative disc disease is noted throughout the lumbar spine. No acute abnormality is noted.   Electronically Signed   By: Sabino Dick M.D.   On: 05/06/2013 13:05        EKG Interpretation   None       MDM   1. Fall at home, initial encounter   2. Contusion of face, initial encounter    Plan discharge  Rolland Porter, MD, Alanson Aly, MD 06/03/13 1245

## 2013-06-03 NOTE — ED Notes (Signed)
Per EMS, pt from home.  Fell landing on buttocks around 8 am.  Landed on tile in bathroom.  No LOC.  Bruise on cheek around left eye.  States hit face on cabinet.  Hx HTN no meds this am.  Vitals:  185/86, hr 63, 16, 98 % ra.  CBG 89.

## 2013-06-03 NOTE — ED Notes (Signed)
Patient assisted with bedpan at this time. MD speaking with patient regarding pain. Will place orders per MD

## 2013-06-03 NOTE — ED Notes (Signed)
Notified Dr.  Tomi Bamberger about pain med request.  Per MD, will speak to patient.  Also spoke to daughter on phone.  Verbal has been given to give dtr information.

## 2013-06-03 NOTE — ED Notes (Signed)
Bed: WA04 Expected date:  Expected time:  Means of arrival:  Comments: EMS Fall 

## 2013-06-13 ENCOUNTER — Other Ambulatory Visit: Payer: Self-pay | Admitting: *Deleted

## 2013-06-13 MED ORDER — GABAPENTIN 300 MG PO CAPS: 300.0000 mg | ORAL_CAPSULE | Freq: Three times a day (TID) | ORAL | Status: DC

## 2013-06-13 NOTE — Telephone Encounter (Signed)
Refill request. Pt daughter brought in medication for [place] to refill. Will forward to Dr Maryjean Ka office

## 2013-06-13 NOTE — Telephone Encounter (Signed)
Refill for neurontin

## 2013-06-14 ENCOUNTER — Telehealth: Payer: Self-pay | Admitting: *Deleted

## 2013-06-14 MED ORDER — GABAPENTIN 300 MG PO CAPS: 300.0000 mg | ORAL_CAPSULE | Freq: Three times a day (TID) | ORAL | Status: DC

## 2013-06-14 NOTE — Telephone Encounter (Signed)
Gabapentin called in to pharmacy

## 2013-06-14 NOTE — Telephone Encounter (Signed)
duplicate

## 2013-06-17 ENCOUNTER — Telehealth: Payer: Self-pay | Admitting: *Deleted

## 2013-06-17 ENCOUNTER — Other Ambulatory Visit: Payer: Self-pay | Admitting: *Deleted

## 2013-06-17 MED ORDER — ROPINIROLE HCL 2 MG PO TABS: 2.0000 mg | ORAL_TABLET | Freq: Two times a day (BID) | ORAL | Status: DC | PRN

## 2013-06-17 NOTE — Telephone Encounter (Signed)
Refill sent in per Dr DDS VO

## 2013-06-20 ENCOUNTER — Other Ambulatory Visit: Payer: Self-pay | Admitting: Internal Medicine

## 2013-06-20 DIAGNOSIS — Z01810 Encounter for preprocedural cardiovascular examination: Secondary | ICD-10-CM

## 2013-06-29 ENCOUNTER — Emergency Department (HOSPITAL_COMMUNITY): Payer: Medicare Other

## 2013-06-29 ENCOUNTER — Emergency Department (HOSPITAL_COMMUNITY)
Admission: EM | Admit: 2013-06-29 | Discharge: 2013-06-29 | Disposition: A | Discharge status: Home / Self Care | Payer: Medicare Other | Attending: Emergency Medicine | Admitting: Emergency Medicine

## 2013-06-29 ENCOUNTER — Encounter (HOSPITAL_COMMUNITY): Payer: Self-pay | Admitting: Emergency Medicine

## 2013-06-29 ENCOUNTER — Telehealth: Payer: Self-pay | Admitting: *Deleted

## 2013-06-29 DIAGNOSIS — M129 Arthropathy, unspecified: Secondary | ICD-10-CM | POA: Insufficient documentation

## 2013-06-29 DIAGNOSIS — I10 Essential (primary) hypertension: Secondary | ICD-10-CM | POA: Insufficient documentation

## 2013-06-29 DIAGNOSIS — Z79899 Other long term (current) drug therapy: Secondary | ICD-10-CM | POA: Insufficient documentation

## 2013-06-29 DIAGNOSIS — Z8673 Personal history of transient ischemic attack (TIA), and cerebral infarction without residual deficits: Secondary | ICD-10-CM | POA: Insufficient documentation

## 2013-06-29 DIAGNOSIS — R609 Edema, unspecified: Secondary | ICD-10-CM | POA: Insufficient documentation

## 2013-06-29 DIAGNOSIS — G8929 Other chronic pain: Secondary | ICD-10-CM | POA: Insufficient documentation

## 2013-06-29 DIAGNOSIS — G2581 Restless legs syndrome: Secondary | ICD-10-CM | POA: Insufficient documentation

## 2013-06-29 DIAGNOSIS — R600 Localized edema: Secondary | ICD-10-CM

## 2013-06-29 DIAGNOSIS — Z8719 Personal history of other diseases of the digestive system: Secondary | ICD-10-CM | POA: Insufficient documentation

## 2013-06-29 LAB — BASIC METABOLIC PANEL
BUN: 29 mg/dL — ABNORMAL HIGH (ref 6–23)
CO2: 28 meq/L (ref 19–32)
Calcium: 8.8 mg/dL (ref 8.4–10.5)
Chloride: 100 mEq/L (ref 96–112)
Creatinine, Ser: 0.77 mg/dL (ref 0.50–1.10)
GFR calc Af Amer: 81 mL/min — ABNORMAL LOW (ref >90)
GFR, EST NON AFRICAN AMERICAN: 70 mL/min — AB (ref >90)
GLUCOSE: 91 mg/dL (ref 70–99)
POTASSIUM: 4.1 meq/L (ref 3.7–5.3)
Sodium: 140 mEq/L (ref 137–147)

## 2013-06-29 LAB — CBC WITH DIFFERENTIAL/PLATELET
Basophils Absolute: 0 10*3/uL (ref 0.0–0.1)
Basophils Relative: 0 % (ref 0–1)
EOS ABS: 0.1 10*3/uL (ref 0.0–0.7)
Eosinophils Relative: 1 % (ref 0–5)
HCT: 29.9 % — ABNORMAL LOW (ref 36.0–46.0)
Hemoglobin: 9.9 g/dL — ABNORMAL LOW (ref 12.0–15.0)
Lymphocytes Relative: 17 % (ref 12–46)
Lymphs Abs: 1.2 10*3/uL (ref 0.7–4.0)
MCH: 29.7 pg (ref 26.0–34.0)
MCHC: 33.1 g/dL (ref 30.0–36.0)
MCV: 89.8 fL (ref 78.0–100.0)
MONOS PCT: 6 % (ref 3–12)
Monocytes Absolute: 0.4 10*3/uL (ref 0.1–1.0)
Neutro Abs: 5.6 10*3/uL (ref 1.7–7.7)
Neutrophils Relative %: 77 % (ref 43–77)
Platelets: 200 10*3/uL (ref 150–400)
RBC: 3.33 MIL/uL — ABNORMAL LOW (ref 3.87–5.11)
RDW: 13.7 % (ref 11.5–15.5)
WBC: 7.3 10*3/uL (ref 4.0–10.5)

## 2013-06-29 LAB — URINE MICROSCOPIC-ADD ON

## 2013-06-29 LAB — URINALYSIS, ROUTINE W REFLEX MICROSCOPIC
BILIRUBIN URINE: NEGATIVE
GLUCOSE, UA: NEGATIVE mg/dL
Ketones, ur: NEGATIVE mg/dL
Nitrite: NEGATIVE
Protein, ur: NEGATIVE mg/dL
SPECIFIC GRAVITY, URINE: 1.01 (ref 1.005–1.030)
UROBILINOGEN UA: 0.2 mg/dL (ref 0.0–1.0)
pH: 6.5 (ref 5.0–8.0)

## 2013-06-29 LAB — PRO B NATRIURETIC PEPTIDE: Pro B Natriuretic peptide (BNP): 902.2 pg/mL — ABNORMAL HIGH (ref 0–450)

## 2013-06-29 MED ORDER — MORPHINE SULFATE 4 MG/ML IJ SOLN
4.0000 mg | Freq: Once | INTRAMUSCULAR | Status: AC
Start: 2013-06-29 — End: 2013-06-29
  Administered 2013-06-29: 4 mg via INTRAVENOUS
  Filled 2013-06-29: qty 1

## 2013-06-29 MED ORDER — FUROSEMIDE 20 MG PO TABS: 20.0000 mg | ORAL_TABLET | Freq: Every day | ORAL | Status: DC

## 2013-06-29 NOTE — Discharge Instructions (Signed)

## 2013-06-29 NOTE — Telephone Encounter (Signed)
Extreme pain. Swollen ankles.  Started dilaudid 2 weeks ago, but it is not helping. She called her pain Doctor, but he said to call PCP.

## 2013-06-29 NOTE — ED Notes (Signed)
Pt returned from chest xray at this time.

## 2013-06-29 NOTE — ED Notes (Signed)
Pt from home c/o bilateral leg edema for 1 day 1/2. No history of CHF. Patient alert and oriented. She also c/o Right hip pain and leg pain that is chronic from sciatica.

## 2013-06-29 NOTE — ED Provider Notes (Signed)
CSN: [id number]     Arrival date & time 06/29/13  1629 History   First MD Initiated Contact with Patient 06/29/13 1641     Chief Complaint  Patient presents with  . Leg Swelling   (Consider location/radiation/quality/duration/timing/severity/associated sxs/prior Treatment) HPI  [name] with a history of restless legs, arthritis, hypertension who presents with bilateral lower extremity edema and right hip pain. Patient has a history of chronic right hip pain and states "I need a hip replacement."  Patient reports worsening of pain. She denies any injury. She takes hydrocodone at home but states that that is not helping. Current pain is 10 out of 10. Patient also reports one to 2 day history bilateral lower extremity swelling. She denies any history of blood clots or recent travel.  She denies any history of heart failure. She has no other symptoms at this time.  Past Medical History  Diagnosis Date  . Hypertension   . Restless legs syndrome     on Requip  . Ulcer causing bleeding and hole in wall of stomach or small intestine   . Gastric ulcer     years ago  . Arthritis     Back, knees  . Constipation   . Urinary bladder calculus   . Urinary incontinence   . Bowel incontinence   . Heart murmur   . Stroke     Mini, no residual   Past Surgical History  Procedure Laterality Date  . Colon resection  50 years ago  . Tonsillectomy    . Uterine fibroid surgery    . Lumbar laminectomy/decompression microdiscectomy  06/25/2011    Procedure: LUMBAR LAMINECTOMY/DECOMPRESSION MICRODISCECTOMY;  Surgeon: Hosie Spangle, MD;  Location: [facility];  Service: Neurosurgery;  Laterality: Right;  RIGHT Lumbar Two-Three hemilaminectomy and microdiskectomy  . Vaginal hysterectomy     Family History  Problem Relation Age of Onset  . Anesthesia problems Neg Hx   . Pancreatic cancer Brother   . Dementia Mother   . Stroke Father    History  Substance Use Topics  . Smoking  status: Never Smoker   . Smokeless tobacco: Never Used  . Alcohol Use: No     Comment: rarely   OB History   Grav Para Term Preterm Abortions TAB SAB Ect Mult Living                 Review of Systems  Constitutional: Negative for fever.  Respiratory: Negative for cough, chest tightness and shortness of breath.   Cardiovascular: Positive for leg swelling. Negative for chest pain.  Gastrointestinal: Negative for nausea, vomiting and abdominal pain.  Genitourinary: Negative for dysuria.  Musculoskeletal: Negative for back pain.       Hip pain  Skin: Negative for wound.  Neurological: Negative for headaches.  Psychiatric/Behavioral: Negative for confusion.  All other systems reviewed and are negative.    Allergies  Amoxicillin; Baclofen; Hctz; and Valium  Home Medications   Current Outpatient Rx  Name  Route  Sig  Dispense  Refill  . gabapentin (NEURONTIN) 300 MG capsule   Oral   Take 1-2 capsules (300-600 mg total) by mouth 3 (three) times daily. Take 300mg  in the morning, 300mg  at 12, and 2 tablets (600mg ) in the evening.   360 capsule   0   . HYDROmorphone (DILAUDID) 4 MG tablet   Oral   Take 4 mg by mouth every 6 (six) hours as needed for severe pain.          Marland Kitchen  ibuprofen (ADVIL,MOTRIN) 200 MG tablet   Oral   Take 200-400 mg by mouth 3 (three) times daily as needed for moderate pain.          Marland Kitchen losartan (COZAAR) 50 MG tablet   Oral   Take 1 tablet (50 mg total) by mouth 2 (two) times daily.   180 tablet   1   . polyethylene glycol powder (GLYCOLAX/MIRALAX) powder   Oral   Take 17 g by mouth daily as needed for mild constipation.          Marland Kitchen rOPINIRole (REQUIP) 2 MG tablet   Oral   Take 4 mg by mouth at bedtime.         . furosemide (LASIX) 20 MG tablet   Oral   Take 1 tablet (20 mg total) by mouth daily.   30 tablet   0    BP 163/55  Pulse 72  Temp(Src) 98.1 F (36.7 C) (Oral)  SpO2 98% Physical Exam  Nursing note and vitals  reviewed. Constitutional: She is oriented to person, place, and time. She appears well-developed and well-nourished. No distress.  elderly  HENT:  Head: Normocephalic and atraumatic.  Eyes: Pupils are equal, round, and reactive to light.  Neck: Neck supple.  Cardiovascular: Normal rate, regular rhythm and normal heart sounds.   Pulmonary/Chest: Effort normal and breath sounds normal. No respiratory distress. She has no wheezes.  Abdominal: Soft. Bowel sounds are normal.  Musculoskeletal: She exhibits edema.  2+ bilateral lower extremity edema to the knees, full range of motion of bilateral hips, good DP pulses bilaterally  Neurological: She is alert and oriented to person, place, and time.  Skin: Skin is warm and dry.  Psychiatric: She has a normal mood and affect.    ED Course  Procedures (including critical care time) Labs Review Labs Reviewed  CBC WITH DIFFERENTIAL - Abnormal; Notable for the following:    RBC 3.33 (*)    Hemoglobin 9.9 (*)    HCT 29.9 (*)    All other components within normal limits  BASIC METABOLIC PANEL - Abnormal; Notable for the following:    BUN 29 (*)    GFR calc non Af Amer 70 (*)    GFR calc Af Amer 81 (*)    All other components within normal limits  URINALYSIS, ROUTINE W REFLEX MICROSCOPIC - Abnormal; Notable for the following:    APPearance CLOUDY (*)    Hgb urine dipstick TRACE (*)    Leukocytes, UA SMALL (*)    All other components within normal limits  PRO B NATRIURETIC PEPTIDE - Abnormal; Notable for the following:    Pro B Natriuretic peptide (BNP) 902.2 (*)    All other components within normal limits  URINE MICROSCOPIC-ADD ON - Abnormal; Notable for the following:    Bacteria, UA MANY (*)    All other components within normal limits   Imaging Review Dg Chest 2 View  06/29/2013   CLINICAL DATA:  Swelling in both legs.  Hypertension.  EXAM: CHEST  2 VIEW  COMPARISON:  05/06/2013  FINDINGS: Stable, normal heart size. Tortuous thoracic  aorta. Interstitial coarsening which is chronic. No infiltrate or edema. No effusion or pneumothorax.  IMPRESSION: No active cardiopulmonary disease.   Electronically Signed   By: Jorje Guild M.D.   On: 06/29/2013 21:06   Dg Hip Complete Right  06/29/2013   CLINICAL DATA:  Pain and edema  EXAM: RIGHT HIP - COMPLETE 2+ VIEW  COMPARISON:  None.  FINDINGS: Frontal pelvis as well as frontal and lateral right hip images were obtained. There is advanced osteoarthritic change in the right hip joint with marked narrowing and extensive subchondral cystic formation on both sides of the joint. There is moderate osteoarthritic change in the left hip. No fracture or dislocation. There is postoperative change in the pelvis.  IMPRESSION: Advanced osteoarthritic change right hip. Moderate osteoarthritic change left hip. No fracture or dislocation.   Electronically Signed   By: Lowella Grip M.D.   On: 06/29/2013 18:20    EKG Interpretation    Date/Time:  Wednesday June 29 2013 18:38:36 EST Ventricular Rate:  60 PR Interval:  167 QRS Duration: 90 QT Interval:  428 QTC Calculation: 428 R Axis:   -15 Text Interpretation:  Normal sinus rhythm Mild T wave depression in III, aVF J point elevation in V2, similar to previous Confirmed by Mingo Amber  MD, [place] ([id number]) on 06/29/2013 6:57:52 PM            MDM   1. Chronic pain   2. Edema of both legs    Patient presents with right hip pain and bilateral lower extremity edema. She has no other symptoms. SHe has full range of motion of the bilateral legs. She denies any shortness of breath.  Hypertension noted in triage.  Otherwise nontoxic. Plain films of the right hip showed chronic degenerative changes. BNP is elevated without evidence of pulmonary edema. Patient has no known history of heart failure. Given lower extremiity edema will discharge with Lasix. Patient has a primary care appointment tomorrow and will followup.  After history, exam, and medical  workup I feel the patient has been appropriately medically screened and is safe for discharge home. Pertinent diagnoses were discussed with the patient. Patient was given return precautions.   Merryl Hacker, MD 06/29/13 [phone]

## 2013-06-29 NOTE — ED Notes (Signed)
Patient denies pain and is resting comfortably. c

## 2013-06-29 NOTE — ED Notes (Signed)
[facility] called to transport pt home awaiting. Call back. Called pt's daughter to inform that pt was being d/c'd home.

## 2013-06-30 ENCOUNTER — Encounter: Payer: Self-pay | Admitting: Internal Medicine

## 2013-06-30 ENCOUNTER — Ambulatory Visit (INDEPENDENT_AMBULATORY_CARE_PROVIDER_SITE_OTHER): Payer: Medicare Other | Admitting: Internal Medicine

## 2013-06-30 VITALS — BP 171/79 | HR 72 | Temp 98.0°F | Resp 18

## 2013-06-30 DIAGNOSIS — G8929 Other chronic pain: Secondary | ICD-10-CM

## 2013-06-30 DIAGNOSIS — R609 Edema, unspecified: Secondary | ICD-10-CM | POA: Insufficient documentation

## 2013-06-30 DIAGNOSIS — D649 Anemia, unspecified: Secondary | ICD-10-CM

## 2013-06-30 DIAGNOSIS — I10 Essential (primary) hypertension: Secondary | ICD-10-CM

## 2013-06-30 DIAGNOSIS — M549 Dorsalgia, unspecified: Secondary | ICD-10-CM

## 2013-06-30 MED ORDER — HYDROCHLOROTHIAZIDE 12.5 MG PO CAPS: ORAL_CAPSULE | ORAL | Status: DC

## 2013-06-30 NOTE — Patient Instructions (Signed)
Your appointment with Dr. Stanford Breed (cardiologist) is 2/17 at 12 noon  .  Arrive at 11:30 in [place]  I will give her HCTZ (diuretic) 12.5 mgTo be taken only when you helper is present    Start the process of Assisted Living with [facility].    See me in 10 days  30 mins

## 2013-06-30 NOTE — Progress Notes (Signed)
Subjective:    Patient ID: Shelly Martin, female    DOB: [birth year], 80 y.o.   MRN: [id number]  HPI  Jari Pigg is here for ER follow up.   She is accompanied by Justice Rocher and presents ina wheelchair.      Was seen for LE edema. Her chronic R hip pain.   Imaging of R hip shows advanced OA and pt has been contemplating Hip replacement.    CXR  Neg for infiltrate or edema  She was discharged with Lasix but she has not filled the RX.    [name] has an Environmental consultant only 2 days a week,  uses a cane,  .  Daughter works and cannot be with her   Allergies  Allergen Reactions  . Amoxicillin Other (See Comments)    Reaction unknown  . Baclofen Other (See Comments)    "fuzzy in the eyes" and dizzy  . Hctz [Hydrochlorothiazide]     nausea  . Valium [Diazepam] Other (See Comments)    Pt became unresponsive and O2 sats dropped.   Past Medical History  Diagnosis Date  . Hypertension   . Restless legs syndrome     on Requip  . Ulcer causing bleeding and hole in wall of stomach or small intestine   . Gastric ulcer     years ago  . Arthritis     Back, knees  . Constipation   . Urinary bladder calculus   . Urinary incontinence   . Bowel incontinence   . Heart murmur   . Stroke     Mini, no residual   Past Surgical History  Procedure Laterality Date  . Colon resection  50 years ago  . Tonsillectomy    . Uterine fibroid surgery    . Lumbar laminectomy/decompression microdiscectomy  06/25/2011    Procedure: LUMBAR LAMINECTOMY/DECOMPRESSION MICRODISCECTOMY;  Surgeon: Hosie Spangle, MD;  Location: [place] [facility];  Service: Neurosurgery;  Laterality: Right;  RIGHT Lumbar Two-Three hemilaminectomy and microdiskectomy  . Vaginal hysterectomy     History   Social History  . Marital Status: Widowed    Spouse Name: N/A    Number of Children: 2  . Years of Education: N/A   Occupational History  . Retired    Social History Main Topics  . Smoking status: Never Smoker   . Smokeless  tobacco: Never Used  . Alcohol Use: No     Comment: rarely  . Drug Use: No  . Sexual Activity: Not Currently   Other Topics Concern  . Not on file   Social History Narrative  . No narrative on file   Family History  Problem Relation Age of Onset  . Anesthesia problems Neg Hx   . Pancreatic cancer Brother   . Dementia Mother   . Stroke Father    Patient Active Problem List   Diagnosis Date Noted  . Loss of weight 01/04/2013  . Situational stress 01/04/2013  . Chronic right hip pain 07/05/2012  . Knee pain, acute 06/29/2012  . Vertigo 06/24/2012  . Acute exacerbation of chronic low back pain 06/24/2012  . Lumbar pain with radiation down right leg 06/09/2012  . DJD (degenerative joint disease) of hip 04/29/2012  . History of TIAs 04/17/2012  . S/P total hysterectomy and bilateral salpingo-oophorectomy 04/17/2012  . Hard of hearing 04/17/2012  . History of anemia 04/17/2012  . RLS (restless legs syndrome) 04/17/2012  . Chronic back pain 04/17/2012  . Spinal stenosis, lumbar 04/17/2012  . Lower back pain  12/20/2011  . Personal history of colonic polyps 10/08/2011  . H/O: CVA (cardiovascular accident) 09/14/2011  . Hypertension 09/14/2011  . Restless legs syndrome 09/14/2011  . Unspecified constipation 08/20/2011  . Urinary incontinence 08/20/2011   Current Outpatient Prescriptions on File Prior to Visit  Medication Sig Dispense Refill  . gabapentin (NEURONTIN) 300 MG capsule Take 1-2 capsules (300-600 mg total) by mouth 3 (three) times daily. Take 300mg  in the morning, 300mg  at 12, and 2 tablets (600mg ) in the evening.  360 capsule  0  . HYDROmorphone (DILAUDID) 4 MG tablet Take 4 mg by mouth every 6 (six) hours as needed for severe pain.       Marland Kitchen ibuprofen (ADVIL,MOTRIN) 200 MG tablet Take 200-400 mg by mouth 3 (three) times daily as needed for moderate pain.       Marland Kitchen losartan (COZAAR) 50 MG tablet Take 1 tablet (50 mg total) by mouth 2 (two) times daily.  180 tablet  1  .  rOPINIRole (REQUIP) 2 MG tablet Take 4 mg by mouth at bedtime.      . furosemide (LASIX) 20 MG tablet Take 1 tablet (20 mg total) by mouth daily.  30 tablet  0  . polyethylene glycol powder (GLYCOLAX/MIRALAX) powder Take 17 g by mouth daily as needed for mild constipation.        No current facility-administered medications on file prior to visit.      Review of Systems See HPI    Objective:   Physical Exam Physical Exam  Nursing note and vitals reviewed.  Constitutional: She is oriented to person, place, and time. She appears well-developed and well-nourished.  HENT:  Head: Normocephalic and atraumatic.  Cardiovascular: Normal rate and regular rhythm. Exam reveals no gallop and no friction rub.  No murmur heard.  Pulmonary/Chest: Breath sounds normal. She has no wheezes. She has no rales.  Neurological: She is alert and oriented to person, place, and time. Ext  2+ edema to knees Skin: Skin is warm and dry.  Psychiatric: She has a normal mood and affect. Her behavior is normal.             Assessment & Plan:  LE edema    I explained to pt that her edema could be resulting from cardiac, kidney or venous insufficiency.  Will change to less potent diuretic HCTZ  12.5 mg .  Seh is at risk of falls and does not always have assistant at home.   I counseled pt she is only to take her diuretic in that am when her assistant is with her.  She is to see me in 10 days  She also has appt upcoming with cardiology  Advanced R hip DJD/chroic pain  Pt is unsure of having surgery now  HTN  Continue Losartan  Anemia  HGB reduced likely due to volume overload  Lumbar stenosis  See me in 10 days  Chronic pain lumbar stenosis ,  Advanced djd

## 2013-07-05 ENCOUNTER — Telehealth: Payer: Self-pay | Admitting: *Deleted

## 2013-07-05 NOTE — Telephone Encounter (Signed)
Returned pt's call regarding her upcoming surgery. Pt is inquiring about a cardiology appt. Advised pt that an appt has already been made and is on 02/17

## 2013-07-11 ENCOUNTER — Ambulatory Visit: Payer: Medicare Other | Admitting: Internal Medicine

## 2013-07-12 ENCOUNTER — Ambulatory Visit: Payer: Medicare Other | Admitting: Cardiology

## 2013-07-14 ENCOUNTER — Other Ambulatory Visit: Payer: Self-pay | Admitting: Orthopedic Surgery

## 2013-07-22 ENCOUNTER — Inpatient Hospital Stay (HOSPITAL_COMMUNITY): Admission: RE | Admit: 2013-07-22 | Payer: Medicare Other | Source: Ambulatory Visit | Admitting: Orthopedic Surgery

## 2013-07-22 ENCOUNTER — Encounter (HOSPITAL_COMMUNITY): Admission: RE | Payer: Self-pay | Source: Ambulatory Visit

## 2013-07-22 SURGERY — ARTHROPLASTY, HIP, TOTAL, ANTERIOR APPROACH
Anesthesia: Choice | Site: Hip | Laterality: Right

## 2013-07-24 ENCOUNTER — Other Ambulatory Visit: Payer: Self-pay | Admitting: Internal Medicine

## 2013-07-25 NOTE — Telephone Encounter (Signed)
Refill request

## 2013-07-28 NOTE — Telephone Encounter (Signed)
error 

## 2013-07-29 ENCOUNTER — Encounter: Payer: Self-pay | Admitting: Cardiology

## 2013-08-01 ENCOUNTER — Encounter: Payer: Self-pay | Admitting: *Deleted

## 2013-08-01 ENCOUNTER — Ambulatory Visit (INDEPENDENT_AMBULATORY_CARE_PROVIDER_SITE_OTHER): Payer: Medicare Other | Admitting: Cardiology

## 2013-08-01 ENCOUNTER — Encounter: Payer: Self-pay | Admitting: Cardiology

## 2013-08-01 VITALS — BP 130/80 | HR 56

## 2013-08-01 DIAGNOSIS — Z01810 Encounter for preprocedural cardiovascular examination: Secondary | ICD-10-CM

## 2013-08-01 DIAGNOSIS — I350 Nonrheumatic aortic (valve) stenosis: Secondary | ICD-10-CM | POA: Insufficient documentation

## 2013-08-01 DIAGNOSIS — I359 Nonrheumatic aortic valve disorder, unspecified: Secondary | ICD-10-CM

## 2013-08-01 DIAGNOSIS — I10 Essential (primary) hypertension: Secondary | ICD-10-CM

## 2013-08-01 NOTE — Assessment & Plan Note (Signed)
Continue present medications. 

## 2013-08-01 NOTE — Progress Notes (Signed)
HPI: 80 year old female for preoperative evaluation prior to hip replacement. Echocardiogram in January 2014 showed normal LV function, mild aortic stenosis with a mean gradient of 15 mm of mercury, mild left atrial enlargement. She has very limited mobility because of hip pain. She denies dyspnea on exertion, orthopnea, PND, chest pain, palpitations or syncope. She does occasionally have pedal edema improved with diuretics.  Current Outpatient Prescriptions  Medication Sig Dispense Refill  . gabapentin (NEURONTIN) 300 MG capsule Take 1-2 capsules (300-600 mg total) by mouth 3 (three) times daily. Take 300mg  in the morning, 300mg  at 12, and 2 tablets (600mg ) in the evening.  360 capsule  0  . hydrochlorothiazide (MICROZIDE) 12.5 MG capsule Take one tablet daily only when your helper is present  30 capsule  0  . HYDROmorphone (DILAUDID) 4 MG tablet Take 4 mg by mouth every 6 (six) hours as needed for severe pain.       Marland Kitchen ibuprofen (ADVIL,MOTRIN) 200 MG tablet Take 200-400 mg by mouth 3 (three) times daily as needed for moderate pain.       Marland Kitchen losartan (COZAAR) 50 MG tablet TAKE 1 TABLET BY MOUTH TWICE DAILY  180 tablet  1  . rOPINIRole (REQUIP) 2 MG tablet 1-2 tab bid prn       No current facility-administered medications for this visit.    Allergies  Allergen Reactions  . Amoxicillin Other (See Comments)    Reaction unknown  . Baclofen Other (See Comments)    "fuzzy in the eyes" and dizzy  . Hctz [Hydrochlorothiazide]     nausea  . Valium [Diazepam] Other (See Comments)    Pt became unresponsive and O2 sats dropped.    Past Medical History  Diagnosis Date  . Hypertension   . Restless legs syndrome     on Requip  . Ulcer causing bleeding and hole in wall of stomach or small intestine   . Gastric ulcer     years ago  . Arthritis     Back, knees  . Constipation   . Urinary bladder calculus   . Urinary incontinence   . Bowel incontinence   . Aortic stenosis   . Stroke    Mini, no residual    Past Surgical History  Procedure Laterality Date  . Colon resection  50 years ago  . Tonsillectomy    . Uterine fibroid surgery    . Lumbar laminectomy/decompression microdiscectomy  06/25/2011    Procedure: LUMBAR LAMINECTOMY/DECOMPRESSION MICRODISCECTOMY;  Surgeon: Hosie Spangle, MD;  Location: [facility];  Service: Neurosurgery;  Laterality: Right;  RIGHT Lumbar Two-Three hemilaminectomy and microdiskectomy  . Vaginal hysterectomy      History   Social History  . Marital Status: Widowed    Spouse Name: N/A    Number of Children: 2  . Years of Education: N/A   Occupational History  . Retired    Social History Main Topics  . Smoking status: Never Smoker   . Smokeless tobacco: Never Used  . Alcohol Use: Yes     Comment: rarely  . Drug Use: No  . Sexual Activity: Not Currently   Other Topics Concern  . Not on file   Social History Narrative  . No narrative on file    Family History  Problem Relation Age of Onset  . Anesthesia problems Neg Hx   . Pancreatic cancer Brother   . Dementia Mother   . Stroke Father     ROS: severe right hip  pain but no fevers or chills, productive cough, hemoptysis, dysphasia, odynophagia, melena, hematochezia, dysuria, hematuria, rash, seizure activity, orthopnea, PND, claudication. Remaining systems are negative.  Physical Exam:   Blood pressure 130/80, pulse 56, SpO2 97.00%.  General:  Well developed/well nourished in NAD Skin warm/dry Patient not depressed No peripheral clubbing Back-normal HEENT-normal/normal eyelids Neck supple/normal carotid upstroke bilaterally; no JVD; no thyromegaly chest - CTA/ normal expansion CV - RRR/normal S1 and S2; no rubs or gallops;  PMI nondisplaced, 3/6 systolic murmur left sternal border radiating to the carotids. Abdomen -NT/ND, no HSM, no mass, + bowel sounds, no bruit 2+ femoral pulses, no bruits Ext-trace ankle edema, no chords, 2+ DP Neuro-grossly  nonfocal  ECG 06/29/2013-sinus rhythm with nonspecific ST changes.

## 2013-08-01 NOTE — Patient Instructions (Signed)
Your physician wants you to follow-up in: [place] will receive a reminder letter in the mail two months in advance. If you don't receive a letter, please call our office to schedule the follow-up appointment.   Your physician has requested that you have a lexiscan myoview. For further information please visit [email or web address]. Please follow instruction sheet, as given.   Your physician has requested that you have an echocardiogram. Echocardiography is a painless test that uses sound waves to create images of your heart. It provides your doctor with information about the size and shape of your heart and how well your heart's chambers and valves are working. This procedure takes approximately one hour. There are no restrictions for this procedure.

## 2013-08-01 NOTE — Assessment & Plan Note (Signed)
Patient is contemplating having hip replacement. Her risk would be higher given her age. We discussed options today. She has limited mobility because of hip pain and therefore functional status cannot be assessed. We will plan to proceed with a Lexiscan Myoview. If normal or low risk and aortic stenosis not severe on echo she could potentially proceed. If markedly abnormal we could explain that her risk is most likely too high to have her surgery. I am not convinced she would want to pursue aggressive cardiac therapy and evaluation. She is in agreement.

## 2013-08-01 NOTE — Assessment & Plan Note (Signed)
Aortic stenosis mild on previous echocardiogram but sounds worse on examination today. Repeat echocardiogram. She's not having dyspnea, chest pain or syncope.

## 2013-08-22 ENCOUNTER — Ambulatory Visit (HOSPITAL_COMMUNITY): Payer: Medicare Other | Attending: Cardiology | Admitting: Radiology

## 2013-08-22 VITALS — BP 217/93 | Ht 59.0 in | Wt 110.0 lb

## 2013-08-22 DIAGNOSIS — Z01810 Encounter for preprocedural cardiovascular examination: Secondary | ICD-10-CM

## 2013-08-22 DIAGNOSIS — R0989 Other specified symptoms and signs involving the circulatory and respiratory systems: Principal | ICD-10-CM | POA: Insufficient documentation

## 2013-08-22 DIAGNOSIS — R002 Palpitations: Secondary | ICD-10-CM | POA: Insufficient documentation

## 2013-08-22 DIAGNOSIS — I359 Nonrheumatic aortic valve disorder, unspecified: Secondary | ICD-10-CM

## 2013-08-22 DIAGNOSIS — R0602 Shortness of breath: Secondary | ICD-10-CM

## 2013-08-22 DIAGNOSIS — I491 Atrial premature depolarization: Secondary | ICD-10-CM

## 2013-08-22 DIAGNOSIS — I10 Essential (primary) hypertension: Secondary | ICD-10-CM | POA: Insufficient documentation

## 2013-08-22 DIAGNOSIS — R112 Nausea with vomiting, unspecified: Secondary | ICD-10-CM

## 2013-08-22 DIAGNOSIS — Z8673 Personal history of transient ischemic attack (TIA), and cerebral infarction without residual deficits: Secondary | ICD-10-CM | POA: Insufficient documentation

## 2013-08-22 DIAGNOSIS — R0609 Other forms of dyspnea: Secondary | ICD-10-CM | POA: Insufficient documentation

## 2013-08-22 MED ORDER — TECHNETIUM TC 99M SESTAMIBI GENERIC - CARDIOLITE
30.0000 | Freq: Once | INTRAVENOUS | Status: AC | PRN
  Administered 2013-08-22: 30 via INTRAVENOUS

## 2013-08-22 MED ORDER — AMINOPHYLLINE 25 MG/ML IV SOLN
75.0000 mg | Freq: Once | INTRAVENOUS | Status: AC
Start: 2013-08-22 — End: 2013-08-22
  Administered 2013-08-22: 75 mg via INTRAVENOUS

## 2013-08-22 MED ORDER — TECHNETIUM TC 99M SESTAMIBI GENERIC - CARDIOLITE
10.0000 | Freq: Once | INTRAVENOUS | Status: AC | PRN
  Administered 2013-08-22: 10 via INTRAVENOUS

## 2013-08-22 MED ORDER — REGADENOSON 0.4 MG/5ML IV SOLN
0.4000 mg | Freq: Once | INTRAVENOUS | Status: AC
  Administered 2013-08-22: 0.4 mg via INTRAVENOUS

## 2013-08-22 NOTE — Progress Notes (Signed)
[facility] [address] [place], [place] [address] [phone]    Cardiology Nuclear Med Study  Shelly Martin is a 79 y.o. female     MRN : [id number]     DOB: [birth year]  Procedure Date: 08/22/2013  Nuclear Med Background Indication for Stress Test:  Evaluation for Ischemia and Surgical Clearance:Hip Replacement with Dr. Maureen Ralphs History:  06/25/12 ECHO: mild AS EF: 55-60%, No Hx of CAD Cardiac Risk Factors: CVA and Hypertension  Symptoms:  DOE and Palpitations   Nuclear Pre-Procedure Caffeine/Decaff Intake:  None NPO After: 8:00pm   Lungs: clear O2 Sat: 97% on room air. IV 0.9% NS with Angio Cath:  22g  IV Site: R Forearm  IV Started by:  Matilde Haymaker, RN  Chest Size (in):  34 Cup Size: D  Height: 4\' 11"  (1.499 m)  Weight:  110 lb (49.896 kg)  BMI:  Body mass index is 22.21 kg/(m^2). Tech Comments: After the patient had her Lexiscan test and was given coffee and crackers and IV was out. About 15 minutes later the patient vomited and IV was restarted Aminophylline 75 given with the resolution of symptoms.     Nuclear Med Study 1 or 2 day study: 1 day  Stress Test Type:  Carlton Adam  Reading MD: n/a  Order Authorizing Provider:  Queen Blossom  Resting Radionuclide: Technetium 67m Sestamibi  Resting Radionuclide Dose: 11.0 mCi   Stress Radionuclide:  Technetium 70m Sestamibi  Stress Radionuclide Dose: 33.0 mCi           Stress Protocol Rest HR: 68 Stress HR: 105  Rest BP: 217/93 Stress BP: 189/69  Exercise Time (min): n/a METS: n/a   Predicted Max HR: 127 bpm % Max HR: 82.68 bpm Rate Pressure Product: 95638   Dose of Adenosine (mg):  n/a Dose of Lexiscan: 0.4 mg  Dose of Atropine (mg): n/a Dose of Dobutamine: n/a mcg/kg/min (at max HR)  Stress Test Technologist: Perrin Maltese, EMT-P  Nuclear Technologist:  Charlton Amor, CNMT     Rest Procedure:  Myocardial perfusion imaging was performed at rest 45 minutes following  the intravenous administration of Technetium 12m Sestamibi. Rest ECG: NSR, poor R wave progression.  Stress Procedure:  The patient received IV Lexiscan 0.4 mg over 15-seconds.  Technetium 46m Sestamibi injected at 30-seconds. This patient was sob and chest tightness with the Lexiscan injection.  Quantitative spect images were obtained after a 45 minute delay. Stress ECG: Non specific ST changes at stress.   QPS Raw Data Images:  Normal; no motion artifact; normal heart/lung ratio. Stress Images:  There is mild reduced uptake along mid anterolateral wall mild in severity and size.  Rest Images:  There is reduced uptake along the inferior wall base to mid mild in severity and size.  Subtraction (SDS):  There is a possible mild amount of ischemia in lateral wall segment.  Transient Ischemic Dilatation (Normal <1.22):  1.00 Lung/Heart Ratio (Normal <0.45):  0.24  Quantitative Gated Spect Images QGS EDV:  75 ml QGS ESV:  27 ml  Impression Exercise Capacity:  Lexiscan with no exercise. BP Response:  Normal blood pressure response. Clinical Symptoms:  Typical symptoms with Lexiscan ECG Impression:  No significant ST segment change suggestive of ischemia. Comparison with Prior Nuclear Study: No previous nuclear study performed  Overall Impression:  Low risk stress nuclear study with possible mild ischemia mid lateral wall segement. .  LV Ejection Fraction: 64%.  LV Wall Motion:  NL LV  Function; NL Wall Motion  Candee Furbish, MD

## 2013-08-22 NOTE — Progress Notes (Signed)
Echocardiogram performed.  

## 2013-08-26 ENCOUNTER — Observation Stay (HOSPITAL_COMMUNITY)
Admission: EM | Admit: 2013-08-26 | Discharge: 2013-08-29 | Disposition: A | Discharge status: Skilled Nursing Facility | Payer: Medicare Other | Attending: Internal Medicine | Admitting: Internal Medicine

## 2013-08-26 ENCOUNTER — Emergency Department (HOSPITAL_COMMUNITY): Payer: Medicare Other

## 2013-08-26 ENCOUNTER — Telehealth: Payer: Self-pay | Admitting: *Deleted

## 2013-08-26 ENCOUNTER — Encounter (HOSPITAL_COMMUNITY): Payer: Self-pay | Admitting: Emergency Medicine

## 2013-08-26 ENCOUNTER — Observation Stay (HOSPITAL_COMMUNITY): Payer: Medicare Other

## 2013-08-26 DIAGNOSIS — M169 Osteoarthritis of hip, unspecified: Secondary | ICD-10-CM | POA: Insufficient documentation

## 2013-08-26 DIAGNOSIS — R9439 Abnormal result of other cardiovascular function study: Secondary | ICD-10-CM

## 2013-08-26 DIAGNOSIS — R55 Syncope and collapse: Secondary | ICD-10-CM

## 2013-08-26 DIAGNOSIS — Z8711 Personal history of peptic ulcer disease: Secondary | ICD-10-CM | POA: Insufficient documentation

## 2013-08-26 DIAGNOSIS — R42 Dizziness and giddiness: Secondary | ICD-10-CM | POA: Diagnosis present

## 2013-08-26 DIAGNOSIS — Z90710 Acquired absence of both cervix and uterus: Secondary | ICD-10-CM

## 2013-08-26 DIAGNOSIS — M545 Low back pain, unspecified: Secondary | ICD-10-CM

## 2013-08-26 DIAGNOSIS — R350 Frequency of micturition: Secondary | ICD-10-CM | POA: Insufficient documentation

## 2013-08-26 DIAGNOSIS — M87059 Idiopathic aseptic necrosis of unspecified femur: Secondary | ICD-10-CM | POA: Insufficient documentation

## 2013-08-26 DIAGNOSIS — Z860100 Personal history of colon polyps, unspecified: Secondary | ICD-10-CM

## 2013-08-26 DIAGNOSIS — K5900 Constipation, unspecified: Secondary | ICD-10-CM

## 2013-08-26 DIAGNOSIS — S329XXA Fracture of unspecified parts of lumbosacral spine and pelvis, initial encounter for closed fracture: Secondary | ICD-10-CM | POA: Diagnosis present

## 2013-08-26 DIAGNOSIS — M25551 Pain in right hip: Secondary | ICD-10-CM

## 2013-08-26 DIAGNOSIS — M1610 Unilateral primary osteoarthritis, unspecified hip: Secondary | ICD-10-CM | POA: Insufficient documentation

## 2013-08-26 DIAGNOSIS — G8929 Other chronic pain: Secondary | ICD-10-CM

## 2013-08-26 DIAGNOSIS — F439 Reaction to severe stress, unspecified: Secondary | ICD-10-CM

## 2013-08-26 DIAGNOSIS — M48061 Spinal stenosis, lumbar region without neurogenic claudication: Secondary | ICD-10-CM

## 2013-08-26 DIAGNOSIS — Z01810 Encounter for preprocedural cardiovascular examination: Secondary | ICD-10-CM

## 2013-08-26 DIAGNOSIS — H9190 Unspecified hearing loss, unspecified ear: Secondary | ICD-10-CM

## 2013-08-26 DIAGNOSIS — S32509A Unspecified fracture of unspecified pubis, initial encounter for closed fracture: Principal | ICD-10-CM | POA: Insufficient documentation

## 2013-08-26 DIAGNOSIS — R609 Edema, unspecified: Secondary | ICD-10-CM

## 2013-08-26 DIAGNOSIS — I350 Nonrheumatic aortic (valve) stenosis: Secondary | ICD-10-CM | POA: Diagnosis present

## 2013-08-26 DIAGNOSIS — N390 Urinary tract infection, site not specified: Secondary | ICD-10-CM

## 2013-08-26 DIAGNOSIS — W19XXXA Unspecified fall, initial encounter: Secondary | ICD-10-CM | POA: Diagnosis present

## 2013-08-26 DIAGNOSIS — S32599A Other specified fracture of unspecified pubis, initial encounter for closed fracture: Secondary | ICD-10-CM

## 2013-08-26 DIAGNOSIS — R634 Abnormal weight loss: Secondary | ICD-10-CM

## 2013-08-26 DIAGNOSIS — Z862 Personal history of diseases of the blood and blood-forming organs and certain disorders involving the immune mechanism: Secondary | ICD-10-CM

## 2013-08-26 DIAGNOSIS — Z9079 Acquired absence of other genital organ(s): Secondary | ICD-10-CM

## 2013-08-26 DIAGNOSIS — Z23 Encounter for immunization: Secondary | ICD-10-CM | POA: Insufficient documentation

## 2013-08-26 DIAGNOSIS — Z90722 Acquired absence of ovaries, bilateral: Secondary | ICD-10-CM

## 2013-08-26 DIAGNOSIS — Z8673 Personal history of transient ischemic attack (TIA), and cerebral infarction without residual deficits: Secondary | ICD-10-CM

## 2013-08-26 DIAGNOSIS — M549 Dorsalgia, unspecified: Secondary | ICD-10-CM

## 2013-08-26 DIAGNOSIS — G2581 Restless legs syndrome: Secondary | ICD-10-CM | POA: Diagnosis present

## 2013-08-26 DIAGNOSIS — R32 Unspecified urinary incontinence: Secondary | ICD-10-CM

## 2013-08-26 DIAGNOSIS — I359 Nonrheumatic aortic valve disorder, unspecified: Secondary | ICD-10-CM | POA: Insufficient documentation

## 2013-08-26 DIAGNOSIS — M25559 Pain in unspecified hip: Secondary | ICD-10-CM

## 2013-08-26 DIAGNOSIS — M25569 Pain in unspecified knee: Secondary | ICD-10-CM

## 2013-08-26 DIAGNOSIS — I10 Essential (primary) hypertension: Secondary | ICD-10-CM | POA: Diagnosis present

## 2013-08-26 HISTORY — DX: Abnormal result of other cardiovascular function study: R94.39

## 2013-08-26 LAB — TROPONIN I
Troponin I: <0.3 ng/mL (ref <0.30)
Troponin I: <0.3 ng/mL (ref <0.30)

## 2013-08-26 LAB — URINE MICROSCOPIC-ADD ON

## 2013-08-26 LAB — COMPREHENSIVE METABOLIC PANEL
ALT: 10 U/L (ref 0–35)
AST: 18 U/L (ref 0–37)
Albumin: 3.4 g/dL — ABNORMAL LOW (ref 3.5–5.2)
Alkaline Phosphatase: 70 U/L (ref 39–117)
BUN: 32 mg/dL — AB (ref 6–23)
CHLORIDE: 102 meq/L (ref 96–112)
CO2: 26 meq/L (ref 19–32)
Calcium: 8.8 mg/dL (ref 8.4–10.5)
Creatinine, Ser: 0.8 mg/dL (ref 0.50–1.10)
GFR calc non Af Amer: 62 mL/min — ABNORMAL LOW (ref >90)
GFR, EST AFRICAN AMERICAN: 71 mL/min — AB (ref >90)
Glucose, Bld: 105 mg/dL — ABNORMAL HIGH (ref 70–99)
Potassium: 4.1 mEq/L (ref 3.7–5.3)
Sodium: 140 mEq/L (ref 137–147)
Total Bilirubin: 0.6 mg/dL (ref 0.3–1.2)
Total Protein: 6 g/dL (ref 6.0–8.3)

## 2013-08-26 LAB — URINALYSIS, ROUTINE W REFLEX MICROSCOPIC
BILIRUBIN URINE: NEGATIVE
Glucose, UA: NEGATIVE mg/dL
HGB URINE DIPSTICK: NEGATIVE
KETONES UR: NEGATIVE mg/dL
Nitrite: NEGATIVE
Protein, ur: NEGATIVE mg/dL
Specific Gravity, Urine: 1.025 (ref 1.005–1.030)
Urobilinogen, UA: 0.2 mg/dL (ref 0.0–1.0)
pH: 6 (ref 5.0–8.0)

## 2013-08-26 LAB — CBC
HCT: 33.9 % — ABNORMAL LOW (ref 36.0–46.0)
Hemoglobin: 11.3 g/dL — ABNORMAL LOW (ref 12.0–15.0)
MCH: 29.8 pg (ref 26.0–34.0)
MCHC: 33.3 g/dL (ref 30.0–36.0)
MCV: 89.4 fL (ref 78.0–100.0)
PLATELETS: 277 10*3/uL (ref 150–400)
RBC: 3.79 MIL/uL — AB (ref 3.87–5.11)
RDW: 13.7 % (ref 11.5–15.5)
WBC: 10.2 10*3/uL (ref 4.0–10.5)

## 2013-08-26 LAB — POC OCCULT BLOOD, ED: Fecal Occult Bld: NEGATIVE

## 2013-08-26 MED ORDER — ENSURE COMPLETE PO LIQD
237.0000 mL | Freq: Two times a day (BID) | ORAL | Status: DC
  Administered 2013-08-26 – 2013-08-29 (×6): 237 mL via ORAL

## 2013-08-26 MED ORDER — HYDRALAZINE HCL 20 MG/ML IJ SOLN: 10.0000 mg | Freq: Four times a day (QID) | INTRAMUSCULAR | Status: DC | PRN

## 2013-08-26 MED ORDER — OXYCODONE HCL 5 MG PO TABS
5.0000 mg | ORAL_TABLET | ORAL | Status: DC | PRN
  Administered 2013-08-26 – 2013-08-29 (×12): 5 mg via ORAL
  Filled 2013-08-26 (×13): qty 1

## 2013-08-26 MED ORDER — ROPINIROLE HCL 1 MG PO TABS
2.0000 mg | ORAL_TABLET | Freq: Every day | ORAL | Status: DC
Start: 2013-08-26 — End: 2013-08-26
  Filled 2013-08-26: qty 2

## 2013-08-26 MED ORDER — GABAPENTIN 300 MG PO CAPS: 300.0000 mg | ORAL_CAPSULE | ORAL | Status: DC

## 2013-08-26 MED ORDER — LEVOFLOXACIN IN D5W 500 MG/100ML IV SOLN
500.0000 mg | Freq: Once | INTRAVENOUS | Status: AC
  Administered 2013-08-26: 500 mg via INTRAVENOUS
  Filled 2013-08-26: qty 100

## 2013-08-26 MED ORDER — HEPARIN SODIUM (PORCINE) 5000 UNIT/ML IJ SOLN
5000.0000 [IU] | Freq: Three times a day (TID) | INTRAMUSCULAR | Status: DC
  Filled 2013-08-26 (×3): qty 1

## 2013-08-26 MED ORDER — ONDANSETRON HCL 4 MG PO TABS: 4.0000 mg | ORAL_TABLET | Freq: Four times a day (QID) | ORAL | Status: DC | PRN

## 2013-08-26 MED ORDER — SODIUM CHLORIDE 0.9 % IJ SOLN
3.0000 mL | Freq: Two times a day (BID) | INTRAMUSCULAR | Status: DC
  Administered 2013-08-27 – 2013-08-29 (×5): 3 mL via INTRAVENOUS

## 2013-08-26 MED ORDER — HYDROMORPHONE HCL 2 MG PO TABS
4.0000 mg | ORAL_TABLET | Freq: Four times a day (QID) | ORAL | Status: DC | PRN
  Administered 2013-08-27 – 2013-08-28 (×3): 4 mg via ORAL
  Filled 2013-08-26 (×3): qty 2

## 2013-08-26 MED ORDER — SODIUM CHLORIDE 0.9 % IV SOLN
INTRAVENOUS | Status: AC
  Administered 2013-08-26: 75 mL/h via INTRAVENOUS

## 2013-08-26 MED ORDER — ACETAMINOPHEN 650 MG RE SUPP: 650.0000 mg | Freq: Four times a day (QID) | RECTAL | Status: DC | PRN

## 2013-08-26 MED ORDER — GABAPENTIN 300 MG PO CAPS
300.0000 mg | ORAL_CAPSULE | Freq: Two times a day (BID) | ORAL | Status: DC
  Administered 2013-08-26 – 2013-08-29 (×7): 300 mg via ORAL
  Filled 2013-08-26 (×9): qty 1

## 2013-08-26 MED ORDER — MORPHINE SULFATE 2 MG/ML IJ SOLN
2.0000 mg | Freq: Once | INTRAMUSCULAR | Status: AC
  Administered 2013-08-26: 2 mg via INTRAVENOUS
  Filled 2013-08-26: qty 1

## 2013-08-26 MED ORDER — ENOXAPARIN SODIUM 40 MG/0.4ML ~~LOC~~ SOLN
40.0000 mg | SUBCUTANEOUS | Status: DC
  Administered 2013-08-26 – 2013-08-29 (×4): 40 mg via SUBCUTANEOUS
  Filled 2013-08-26 (×4): qty 0.4

## 2013-08-26 MED ORDER — ONDANSETRON HCL 4 MG/2ML IJ SOLN: 4.0000 mg | Freq: Four times a day (QID) | INTRAMUSCULAR | Status: DC | PRN

## 2013-08-26 MED ORDER — GABAPENTIN 600 MG PO TABS
600.0000 mg | ORAL_TABLET | Freq: Every day | ORAL | Status: DC
  Administered 2013-08-26 – 2013-08-28 (×3): 600 mg via ORAL
  Filled 2013-08-26 (×4): qty 1

## 2013-08-26 MED ORDER — IBUPROFEN 200 MG PO TABS
200.0000 mg | ORAL_TABLET | Freq: Three times a day (TID) | ORAL | Status: DC | PRN
  Filled 2013-08-26 (×2): qty 2

## 2013-08-26 MED ORDER — ACETAMINOPHEN 325 MG PO TABS: 650.0000 mg | ORAL_TABLET | Freq: Four times a day (QID) | ORAL | Status: DC | PRN

## 2013-08-26 MED ORDER — LOSARTAN POTASSIUM 50 MG PO TABS
50.0000 mg | ORAL_TABLET | Freq: Two times a day (BID) | ORAL | Status: DC
  Administered 2013-08-26 – 2013-08-29 (×7): 50 mg via ORAL
  Filled 2013-08-26 (×8): qty 1

## 2013-08-26 MED ORDER — CIPROFLOXACIN HCL 250 MG PO TABS
250.0000 mg | ORAL_TABLET | Freq: Two times a day (BID) | ORAL | Status: DC
  Administered 2013-08-26 – 2013-08-29 (×7): 250 mg via ORAL
  Filled 2013-08-26 (×9): qty 1

## 2013-08-26 MED ORDER — ROPINIROLE HCL 1 MG PO TABS
4.0000 mg | ORAL_TABLET | Freq: Every day | ORAL | Status: DC
  Administered 2013-08-26 – 2013-08-28 (×3): 4 mg via ORAL
  Filled 2013-08-26 (×4): qty 4

## 2013-08-26 MED ORDER — METOPROLOL TARTRATE 1 MG/ML IV SOLN: 5.0000 mg | INTRAVENOUS | Status: DC | PRN

## 2013-08-26 MED ORDER — ASPIRIN 81 MG PO CHEW
81.0000 mg | CHEWABLE_TABLET | Freq: Every day | ORAL | Status: DC
  Administered 2013-08-26 – 2013-08-29 (×4): 81 mg via ORAL
  Filled 2013-08-26 (×5): qty 1

## 2013-08-26 NOTE — Progress Notes (Addendum)
Patient Demographics  Shelly Martin, is a 80 y.o. female, DOB - 07-06-1918, BHA:[id number]  Admit date - 08/26/2013   Admitting Physician Berle Mull, MD  Outpatient Primary MD for the patient is SCHOENHOFF,DEBBIE, MD  LOS - 0   Chief Complaint  Patient presents with  . Fall  . Diarrhea        Assessment & Plan    1. Dizziness - lightheadedness - most likely secondary to orthostasis caused by multiple episodes of diarrhea, she never lost consciousness, no chest pain palpitations, no seizure-like activity. We'll monitor orthostatics, gentle IV fluids for hydration, PT eval.    2. Right hip avascular necrosis with acute on chronic right hip pain. Due to recent fall with worsening of the pain we'll get a CT scan of the right hip to rule out an occult fracture missed an x-ray, orthopedics has been called, patient was scheduled for outpatient hip replacement surgery by Dr. Wynelle Link. She will be a high-risk candidate for adverse cardiopulmonary outcome during the perioperative period and this has been clearly explained to both patient and her daughter who both accept the risks and want to proceed with surgery if required.    During her recent stress test she has possible mild reversible ischemia, on echogram moderate aortic stenosis with EF of 60% in chronic grade 1 diastolic CHF. Will call cardiology for preoperative evaluation as requested by family.     Cardio-Pulm Risk stratification for surgery and recommendations to minimize the same:-  A.Cardio-Pulmonary Risk -  this patient is a moderate to high for adverse Cardio-Pulmonary  Outcome  from surgery, the risks and benefits were discussed and acceptable to shin and her daughter.  Recommendations for optimizing Cardio-Pulmonary  Risk risk  factors  1. Keep SBP<140, HR<85, use Lopressor 5mg  IV q4hrs PRN, or B.Blocker drip PRN. 2. Moniotr I&Os. 3. Minimal sedation and Narcotics. 4. Good pulmunary toilet. 5. PRN Nebs and as needed oxygen to keep Pox>90% 6. Hb>8, transfuse as needed- Lasix 10mg  IV after each unit PRBC Transfused.   B.Bleeding Risk - no previous surgical complications, no easy bruising    Lab Results  Component Value Date   PLT 277 08/26/2013                  Lab Results  Component Value Date   INR 0.97 11/13/2009      Will request Surgeon to please Order Lovenox/DVT prophylaxis of his/her choice when OK from Surgeon's standpoint post op.      3.HTN - home dose Cozaar, as needed hydralazine and Lopressor added for better control.     4. Moderate aortic stenosis, mild reversible ischemia on recent stress test, chronic grade 1 diastolic CHF with EF 97%. Currently compensated from heart standpoint, continue home dose Cozaar, added as needed hydralazine and Lopressor for better blood pressure control. We'll try to avoid rapid reductions in preload during surgery due to moderate aortic stenosis.     5.UTI - continue cipro, follow cultures       6. ? Pharyngeal fullness - outpatient CT , ENT follow up.        Code Status: Full  Family Communication: daughter over the phone  Disposition Plan: TBD   Procedures CT Head and C  spine non acute, CT Hip ordered   Consults  Ortho, Cards called   Medications  Scheduled Meds: . ciprofloxacin  250 mg Oral BID  . gabapentin  300 mg Oral BID WC   And  . gabapentin  600 mg Oral QHS  . heparin  5,000 Units Subcutaneous 3 times per day  . losartan  50 mg Oral BID  . rOPINIRole  2 mg Oral QHS  . sodium chloride  3 mL Intravenous Q12H   Continuous Infusions:  PRN Meds:.acetaminophen, hydrALAZINE, HYDROmorphone, ibuprofen, ondansetron (ZOFRAN) IV, oxyCODONE  DVT Prophylaxis  Heparin    Lab Results  Component Value Date   PLT 277  08/26/2013    Antibiotics    Anti-infectives   Start     Dose/Rate Route Frequency Ordered Stop   08/26/13 0800  ciprofloxacin (CIPRO) tablet 250 mg     250 mg Oral 2 times daily 08/26/13 0638     08/26/13 0445  levofloxacin (LEVAQUIN) IVPB 500 mg     500 mg 100 mL/hr over 60 Minutes Intravenous  Once 08/26/13 0437 08/26/13 0610          Subjective:   Shelly Martin today has, No headache, No chest pain, No abdominal pain - No Nausea, No new weakness tingling or numbness, No Cough - SOB.  ++ R Hip pain  Objective:   Filed Vitals:   08/26/13 0633 08/26/13 0810 08/26/13 0811 08/26/13 0814  BP: 171/79 155/60 178/75 180/70  Pulse: 71 74 77 84  Temp: 97.9 F (36.6 C)     TempSrc:      Resp: 16     Height: 5' (1.524 m)     Weight: 49.442 kg (109 lb)     SpO2: 99%  100% 100%    Wt Readings from Last 3 Encounters:  08/26/13 49.442 kg (109 lb)  08/22/13 49.896 kg (110 lb)  01/04/13 51.71 kg (114 lb)     Intake/Output Summary (Last 24 hours) at 08/26/13 0852 Last data filed at 08/26/13 0743  Gross per 24 hour  Intake    240 ml  Output      1 ml  Net    239 ml     Physical Exam  Awake Alert, Oriented X 3, No new F.N deficits, Normal affect [place].AT,PERRAL Supple Neck,No JVD, No cervical lymphadenopathy appriciated.  Symmetrical Chest wall movement, Good air movement bilaterally, CTAB RRR,No Gallops,Rubs , positive 4/5 aortic systolic murmur, No Parasternal Heave +ve B.Sounds, Abd Soft, Non tender, No organomegaly appriciated, No rebound - guarding or rigidity. No Cyanosis, Clubbing or edema, No new Rash or bruise      Data Review   Micro Results No results found for this or any previous visit (from the past 240 hour(s)).  Radiology Reports Dg Chest 1 View  08/26/2013   CLINICAL DATA:  Fall, right hip pain  EXAM: CHEST - 1 VIEW  COMPARISON:  Prior chest x-ray 06/29/2013  FINDINGS: Stable cardiomegaly with left heart enlargement. Mediastinal contours are  unchanged. Low inspiratory volumes. No pleural effusion, pneumothorax or focal airspace consolidation. No suspicious pulmonary mass or nodule. No acute osseous abnormality. Mild degenerative changes in the right shoulder.  IMPRESSION: No active disease.   Electronically Signed   By: Jacqulynn Cadet M.D.   On: 08/26/2013 02:31   Dg Pelvis 1-2 Views  08/26/2013   CLINICAL DATA:  Fall, right hip pain  EXAM: PELVIS - 1-2 VIEW  COMPARISON:  Prior abdominal radiographs 09/30/2012  FINDINGS: The bones are  diffusely osteopenic. Numerous surgical ligatures project over the lower abdomen and pelvis. Linear artifact projects over the pelvis secondary to multiple cardiac leads. No acute fracture or malalignment identified. Compared to May of 2014 there has been significant interval progression of right hip osteoarthritis with increased loss of the superolateral joint space and developing subchondral cyst in the acetabulum. Additionally, the femoral head is sclerotic and demonstrates irregularity of the articular surface. Overall findings are concerning for progressive avascular necrosis and secondary osteoarthritis. Lower lumbar degenerative disc disease and facet arthropathy.  IMPRESSION: 1. No acute fracture identified. Marked osteopenia limits evaluation for nondisplaced fractures. 2. Findings suggest developing right femoral head avascular necrosis and progressive secondary right hip osteoarthritis compared to 09/30/2012.   Electronically Signed   By: Jacqulynn Cadet M.D.   On: 08/26/2013 02:30   Ct Head Wo Contrast  08/26/2013   CLINICAL DATA:  Unwitnessed fall, dizziness.  EXAM: CT HEAD WITHOUT CONTRAST  CT CERVICAL SPINE WITHOUT CONTRAST  TECHNIQUE: Multidetector CT imaging of the head and cervical spine was performed following the standard protocol without intravenous contrast. Multiplanar CT image reconstructions of the cervical spine were also generated.  COMPARISON:  CT HEAD W/O CM dated 06/03/2013; CT  MAXILLOFACIAL W/O CM dated 06/03/2013; DG FLUORO GUIDE NDL PLC/BX dated 05/20/2012; CT HEAD W/O CM dated 12/20/2011  FINDINGS: CT HEAD FINDINGS  The ventricles and sulci are normal for age. No intraparenchymal hemorrhage, mass effect nor midline shift. Patchy supratentorial white matter hypodensities are less than expected for patient's age and though non-specific suggest sequelae of chronic small vessel ischemic disease. No acute large vascular territory infarcts.  No abnormal extra-axial fluid collections. 11 mm left parafalcine meningioma again seen without mass effect. Basal cisterns are patent. Moderate calcific atherosclerosis of the carotid siphons.  No skull fracture. Trace paranasal sinus mucosal thickening without air-fluid levels. The mastoid air cells are well aerated. . The included ocular globes and orbital contents are non-suspicious. Status post right ocular lens implant.  CT CERVICAL SPINE FINDINGS  Cervical vertebral bodies and posterior elements are intact. Grade 1 C2-3 anterolisthesis, grade 1 C7-T1 anterolisthesis on degenerative basis. Severe C3-4 thru C6-7 degenerative disc disease. C1-2 articulation maintained with severe arthropathy and possible small os auto none 4 8 a.m. Mild broad dextroscoliosis. No destructive bony lesions.  Asymmetric fullness of the right pharyngeal soft tissues, with coarse calcification, axial 41/80. This could reflect very tortuous vascular structures or mass contiguous with the right sternocleidomastoid muscle posteriorly. This is stable from June 03, 2013  Degenerative disc disease and facet arthropathy results in severe canal stenosis at C5-6, mild-to-moderate C4-5. Severe neural foraminal narrowing C3-4 thru C6-7.  IMPRESSION: CT head:  No acute intracranial process.  Stable left parafalcine 11 mm meningioma without mass effect. Involutional changes. Mild white matter changes suggest chronic small vessel ischemic disease.  CT cervical spine: No acute cervical  spine fracture. Grade 1 C2-3 and grade 1 C7-T1 anterolisthesis with on degenerative basis.  Asymmetric fullness of the right pharyngeal soft tissues, which could reflect prominent vascular structure, less likely mass in would be better characterized on CT or MRI of the neck with contrast on a nonemergent basis, as clinically indicated.   Electronically Signed   By: Elon Alas   On: 08/26/2013 03:22   Ct Cervical Spine Wo Contrast  08/26/2013   CLINICAL DATA:  Unwitnessed fall, dizziness.  EXAM: CT HEAD WITHOUT CONTRAST  CT CERVICAL SPINE WITHOUT CONTRAST  TECHNIQUE: Multidetector CT imaging of the head and cervical  spine was performed following the standard protocol without intravenous contrast. Multiplanar CT image reconstructions of the cervical spine were also generated.  COMPARISON:  CT HEAD W/O CM dated 06/03/2013; CT MAXILLOFACIAL W/O CM dated 06/03/2013; DG FLUORO GUIDE NDL PLC/BX dated 05/20/2012; CT HEAD W/O CM dated 12/20/2011  FINDINGS: CT HEAD FINDINGS  The ventricles and sulci are normal for age. No intraparenchymal hemorrhage, mass effect nor midline shift. Patchy supratentorial white matter hypodensities are less than expected for patient's age and though non-specific suggest sequelae of chronic small vessel ischemic disease. No acute large vascular territory infarcts.  No abnormal extra-axial fluid collections. 11 mm left parafalcine meningioma again seen without mass effect. Basal cisterns are patent. Moderate calcific atherosclerosis of the carotid siphons.  No skull fracture. Trace paranasal sinus mucosal thickening without air-fluid levels. The mastoid air cells are well aerated. . The included ocular globes and orbital contents are non-suspicious. Status post right ocular lens implant.  CT CERVICAL SPINE FINDINGS  Cervical vertebral bodies and posterior elements are intact. Grade 1 C2-3 anterolisthesis, grade 1 C7-T1 anterolisthesis on degenerative basis. Severe C3-4 thru C6-7 degenerative  disc disease. C1-2 articulation maintained with severe arthropathy and possible small os auto none 4 8 a.m. Mild broad dextroscoliosis. No destructive bony lesions.  Asymmetric fullness of the right pharyngeal soft tissues, with coarse calcification, axial 41/80. This could reflect very tortuous vascular structures or mass contiguous with the right sternocleidomastoid muscle posteriorly. This is stable from June 03, 2013  Degenerative disc disease and facet arthropathy results in severe canal stenosis at C5-6, mild-to-moderate C4-5. Severe neural foraminal narrowing C3-4 thru C6-7.  IMPRESSION: CT head:  No acute intracranial process.  Stable left parafalcine 11 mm meningioma without mass effect. Involutional changes. Mild white matter changes suggest chronic small vessel ischemic disease.  CT cervical spine: No acute cervical spine fracture. Grade 1 C2-3 and grade 1 C7-T1 anterolisthesis with on degenerative basis.  Asymmetric fullness of the right pharyngeal soft tissues, which could reflect prominent vascular structure, less likely mass in would be better characterized on CT or MRI of the neck with contrast on a nonemergent basis, as clinically indicated.   Electronically Signed   By: Elon Alas   On: 08/26/2013 03:22    CBC  Recent Labs Lab 08/26/13 0137  WBC 10.2  HGB 11.3*  HCT 33.9*  PLT 277  MCV 89.4  MCH 29.8  MCHC 33.3  RDW 13.7    Chemistries   Recent Labs Lab 08/26/13 0230  NA 140  K 4.1  CL 102  CO2 26  GLUCOSE 105*  BUN 32*  CREATININE 0.80  CALCIUM 8.8  AST 18  ALT 10  ALKPHOS 70  BILITOT 0.6   ------------------------------------------------------------------------------------------------------------------ estimated creatinine clearance is 31.6 ml/min (by C-G formula based on Cr of 0.8). ------------------------------------------------------------------------------------------------------------------ No results found for this basename: HGBA1C,  in the  last 72 hours ------------------------------------------------------------------------------------------------------------------ No results found for this basename: CHOL, HDL, LDLCALC, TRIG, CHOLHDL, LDLDIRECT,  in the last 72 hours ------------------------------------------------------------------------------------------------------------------ No results found for this basename: TSH, T4TOTAL, FREET3, T3FREE, THYROIDAB,  in the last 72 hours ------------------------------------------------------------------------------------------------------------------ No results found for this basename: VITAMINB12, FOLATE, FERRITIN, TIBC, IRON, RETICCTPCT,  in the last 72 hours  Coagulation profile No results found for this basename: INR, PROTIME,  in the last 168 hours  No results found for this basename: DDIMER,  in the last 72 hours  Cardiac Enzymes  Recent Labs Lab 08/26/13 0230  TROPONINI <0.30   ------------------------------------------------------------------------------------------------------------------ No components  found with this basename: POCBNP,      Time Spent in minutes  35   SINGH,PRASHANT K M.D on 08/26/2013 at 8:52 AM  Between 7am to 7pm - Pager - [phone]  After 7pm go to [email or web address] - password [email or web address]  And look for the night coverage person covering for me after hours  [facility] Office  [phone]

## 2013-08-26 NOTE — ED Notes (Signed)
Dr. Posey Pronto at the Bedside.

## 2013-08-26 NOTE — ED Notes (Signed)
Patient transported to CT 

## 2013-08-26 NOTE — H&P (Signed)
[facility] History and Physical  Patient: Shelly Martin  BTD:[id number]  DOB: Aug 29, 1918  DOS: the patient was seen and examined on 08/26/2013 PCP: SCHOENHOFF,DEBBIE, MD  Chief Complaint: Dizziness and a fall  HPI: Shelly Martin is a 80 y.o. female with Past medical history of hypertension, restless leg syndrome, chronic right hip pain for AVN, TIA, aortic stenosis, recent low-risk stress nuclear study with possible mild ischemia mid lateral wall segement. The patient is presenting with complaints of dizziness. She mentions that she had constipation since last few days and took supposedly today. After which she started having recurrent episodes of loose watery bowel movements without any blood. Since last one day she also has some increased burning urination. She has at her baseline urinary incontinence and excess frequency of urination which is unchanged. While she was getting out of the bed to go to the restroom and trying to get a walker she felt lightheaded which he describes as dizziness without vertigo and lost her balance and fell on the ground. She complained of right-sided hip pain after that and call her husband. She denies any loss of consciousness denies any head injury neck injury or any other injury. She has been having on and off dizziness which is unchanged in frequency since last few months. She had an echocardiogram which showed worsening of her aortic stenosis from mild to moderate and a low-risk but possible mild ischemia on stress test on August 22 2013 for preoperative workup for her right hip arthroplasty for AVN which is scheduled in August. She complains of severe pain in her right hip and mentions is unable to walk.  The patient is coming from home. And at her baseline independent for most of her  ADL.  Review of Systems: as mentioned in the history of present illness.  A Comprehensive review of the other systems is negative.  Past Medical History   Diagnosis Date  . Hypertension   . Restless legs syndrome     on Requip  . Ulcer causing bleeding and hole in wall of stomach or small intestine   . Gastric ulcer     years ago  . Arthritis     Back, knees  . Constipation   . Urinary bladder calculus   . Urinary incontinence   . Bowel incontinence   . Aortic stenosis   . Stroke     Mini, no residual   Past Surgical History  Procedure Laterality Date  . Colon resection  50 years ago  . Tonsillectomy    . Uterine fibroid surgery    . Lumbar laminectomy/decompression microdiscectomy  06/25/2011    Procedure: LUMBAR LAMINECTOMY/DECOMPRESSION MICRODISCECTOMY;  Surgeon: Hosie Spangle, MD;  Location: [facility];  Service: Neurosurgery;  Laterality: Right;  RIGHT Lumbar Two-Three hemilaminectomy and microdiskectomy  . Vaginal hysterectomy     Social History:  reports that she has never smoked. She has never used smokeless tobacco. She reports that she drinks alcohol. She reports that she does not use illicit drugs.  Allergies  Allergen Reactions  . Amoxicillin Other (See Comments)    Reaction unknown  . Baclofen Other (See Comments)    "fuzzy in the eyes" and dizzy  . Hctz [Hydrochlorothiazide]     nausea  . Valium [Diazepam] Other (See Comments)    Pt became unresponsive and O2 sats dropped.    Family History  Problem Relation Age of Onset  . Anesthesia problems Neg Hx   . Pancreatic cancer Brother   . Dementia  Mother   . Stroke Father     Prior to Admission medications   Medication Sig Start Date End Date Taking? Authorizing Provider  gabapentin (NEURONTIN) 300 MG capsule Take 300-600 mg by mouth See admin instructions. Take 300mg  in the morning, 300mg  at 12, and 2 tablets (600mg ) in the evening. 06/14/13  Yes Lanice Shirts, MD  HYDROmorphone (DILAUDID) 4 MG tablet Take 4 mg by mouth every 6 (six) hours as needed for severe pain.  06/16/13  Yes Historical Provider, MD  ibuprofen (ADVIL,MOTRIN) 200 MG tablet  Take 200-400 mg by mouth 3 (three) times daily as needed for moderate pain.    Yes Historical Provider, MD  losartan (COZAAR) 50 MG tablet TAKE 1 TABLET BY MOUTH TWICE DAILY   Yes Lanice Shirts, MD  rOPINIRole (REQUIP) 2 MG tablet Take 2 mg by mouth at bedtime. 1-2 tab bid prn   Yes Historical Provider, MD    Physical Exam: Filed Vitals:   08/26/13 0345 08/26/13 0500 08/26/13 0530 08/26/13 0543  BP: 179/75 175/77 174/78 174/78  Pulse: 71 71 74 81  Temp:    98.3 F (36.8 C)  TempSrc:    Oral  Resp: 12 13 25 15   SpO2: 98% 99% 100% 99%    General: Alert, Awake and Oriented to Time, Place and Person. Appear in moderate distress Eyes: PERRL ENT: Oral Mucosa clear moist. Neck: no JVD Cardiovascular: S1 and S2 Present, aortic systolic Murmur, Peripheral Pulses Present Respiratory: Bilateral Air entry equal and Decreased, Clear to Auscultation,  no Crackles,no wheezes Abdomen: Bowel Sound Present, Soft and Non tender Skin: no Rash Extremities: no Pedal edema, no calf tenderness Neurologic: Grossly Unremarkable.  Labs on Admission:  CBC:  Recent Labs Lab 08/26/13 0137  WBC 10.2  HGB 11.3*  HCT 33.9*  MCV 89.4  PLT 277    CMP     Component Value Date/Time   NA 140 08/26/2013 0230   K 4.1 08/26/2013 0230   CL 102 08/26/2013 0230   CO2 26 08/26/2013 0230   GLUCOSE 105* 08/26/2013 0230   BUN 32* 08/26/2013 0230   CREATININE 0.80 08/26/2013 0230   CREATININE 0.96 01/04/2013 1258   CALCIUM 8.8 08/26/2013 0230   PROT 6.0 08/26/2013 0230   ALBUMIN 3.4* 08/26/2013 0230   AST 18 08/26/2013 0230   ALT 10 08/26/2013 0230   ALKPHOS 70 08/26/2013 0230   BILITOT 0.6 08/26/2013 0230   GFRNONAA 62* 08/26/2013 0230   GFRAA 71* 08/26/2013 0230    No results found for this basename: LIPASE, AMYLASE,  in the last 168 hours No results found for this basename: AMMONIA,  in the last 168 hours   Recent Labs Lab 08/26/13 0230  TROPONINI <0.30   BNP (last 3 results)  Recent Labs  06/29/13 1750   PROBNP 902.2*    Radiological Exams on Admission: Dg Chest 1 View  08/26/2013   CLINICAL DATA:  Fall, right hip pain  EXAM: CHEST - 1 VIEW  COMPARISON:  Prior chest x-ray 06/29/2013  FINDINGS: Stable cardiomegaly with left heart enlargement. Mediastinal contours are unchanged. Low inspiratory volumes. No pleural effusion, pneumothorax or focal airspace consolidation. No suspicious pulmonary mass or nodule. No acute osseous abnormality. Mild degenerative changes in the right shoulder.  IMPRESSION: No active disease.   Electronically Signed   By: Jacqulynn Cadet M.D.   On: 08/26/2013 02:31   Dg Pelvis 1-2 Views  08/26/2013   CLINICAL DATA:  Fall, right hip pain  EXAM: PELVIS -  1-2 VIEW  COMPARISON:  Prior abdominal radiographs 09/30/2012  FINDINGS: The bones are diffusely osteopenic. Numerous surgical ligatures project over the lower abdomen and pelvis. Linear artifact projects over the pelvis secondary to multiple cardiac leads. No acute fracture or malalignment identified. Compared to May of 2014 there has been significant interval progression of right hip osteoarthritis with increased loss of the superolateral joint space and developing subchondral cyst in the acetabulum. Additionally, the femoral head is sclerotic and demonstrates irregularity of the articular surface. Overall findings are concerning for progressive avascular necrosis and secondary osteoarthritis. Lower lumbar degenerative disc disease and facet arthropathy.  IMPRESSION: 1. No acute fracture identified. Marked osteopenia limits evaluation for nondisplaced fractures. 2. Findings suggest developing right femoral head avascular necrosis and progressive secondary right hip osteoarthritis compared to 09/30/2012.   Electronically Signed   By: Jacqulynn Cadet M.D.   On: 08/26/2013 02:30   Ct Head Wo Contrast  08/26/2013   CLINICAL DATA:  Unwitnessed fall, dizziness.  EXAM: CT HEAD WITHOUT CONTRAST  CT CERVICAL SPINE WITHOUT CONTRAST   TECHNIQUE: Multidetector CT imaging of the head and cervical spine was performed following the standard protocol without intravenous contrast. Multiplanar CT image reconstructions of the cervical spine were also generated.  COMPARISON:  CT HEAD W/O CM dated 06/03/2013; CT MAXILLOFACIAL W/O CM dated 06/03/2013; DG FLUORO GUIDE NDL PLC/BX dated 05/20/2012; CT HEAD W/O CM dated 12/20/2011  FINDINGS: CT HEAD FINDINGS  The ventricles and sulci are normal for age. No intraparenchymal hemorrhage, mass effect nor midline shift. Patchy supratentorial white matter hypodensities are less than expected for patient's age and though non-specific suggest sequelae of chronic small vessel ischemic disease. No acute large vascular territory infarcts.  No abnormal extra-axial fluid collections. 11 mm left parafalcine meningioma again seen without mass effect. Basal cisterns are patent. Moderate calcific atherosclerosis of the carotid siphons.  No skull fracture. Trace paranasal sinus mucosal thickening without air-fluid levels. The mastoid air cells are well aerated. . The included ocular globes and orbital contents are non-suspicious. Status post right ocular lens implant.  CT CERVICAL SPINE FINDINGS  Cervical vertebral bodies and posterior elements are intact. Grade 1 C2-3 anterolisthesis, grade 1 C7-T1 anterolisthesis on degenerative basis. Severe C3-4 thru C6-7 degenerative disc disease. C1-2 articulation maintained with severe arthropathy and possible small os auto none 4 8 a.m. Mild broad dextroscoliosis. No destructive bony lesions.  Asymmetric fullness of the right pharyngeal soft tissues, with coarse calcification, axial 41/80. This could reflect very tortuous vascular structures or mass contiguous with the right sternocleidomastoid muscle posteriorly. This is stable from June 03, 2013  Degenerative disc disease and facet arthropathy results in severe canal stenosis at C5-6, mild-to-moderate C4-5. Severe neural foraminal  narrowing C3-4 thru C6-7.  IMPRESSION: CT head:  No acute intracranial process.  Stable left parafalcine 11 mm meningioma without mass effect. Involutional changes. Mild white matter changes suggest chronic small vessel ischemic disease.  CT cervical spine: No acute cervical spine fracture. Grade 1 C2-3 and grade 1 C7-T1 anterolisthesis with on degenerative basis.  Asymmetric fullness of the right pharyngeal soft tissues, which could reflect prominent vascular structure, less likely mass in would be better characterized on CT or MRI of the neck with contrast on a nonemergent basis, as clinically indicated.   Electronically Signed   By: Elon Alas   On: 08/26/2013 03:22   Ct Cervical Spine Wo Contrast  08/26/2013   CLINICAL DATA:  Unwitnessed fall, dizziness.  EXAM: CT HEAD WITHOUT CONTRAST  CT  CERVICAL SPINE WITHOUT CONTRAST  TECHNIQUE: Multidetector CT imaging of the head and cervical spine was performed following the standard protocol without intravenous contrast. Multiplanar CT image reconstructions of the cervical spine were also generated.  COMPARISON:  CT HEAD W/O CM dated 06/03/2013; CT MAXILLOFACIAL W/O CM dated 06/03/2013; DG FLUORO GUIDE NDL PLC/BX dated 05/20/2012; CT HEAD W/O CM dated 12/20/2011  FINDINGS: CT HEAD FINDINGS  The ventricles and sulci are normal for age. No intraparenchymal hemorrhage, mass effect nor midline shift. Patchy supratentorial white matter hypodensities are less than expected for patient's age and though non-specific suggest sequelae of chronic small vessel ischemic disease. No acute large vascular territory infarcts.  No abnormal extra-axial fluid collections. 11 mm left parafalcine meningioma again seen without mass effect. Basal cisterns are patent. Moderate calcific atherosclerosis of the carotid siphons.  No skull fracture. Trace paranasal sinus mucosal thickening without air-fluid levels. The mastoid air cells are well aerated. . The included ocular globes and orbital  contents are non-suspicious. Status post right ocular lens implant.  CT CERVICAL SPINE FINDINGS  Cervical vertebral bodies and posterior elements are intact. Grade 1 C2-3 anterolisthesis, grade 1 C7-T1 anterolisthesis on degenerative basis. Severe C3-4 thru C6-7 degenerative disc disease. C1-2 articulation maintained with severe arthropathy and possible small os auto none 4 8 a.m. Mild broad dextroscoliosis. No destructive bony lesions.  Asymmetric fullness of the right pharyngeal soft tissues, with coarse calcification, axial 41/80. This could reflect very tortuous vascular structures or mass contiguous with the right sternocleidomastoid muscle posteriorly. This is stable from June 03, 2013  Degenerative disc disease and facet arthropathy results in severe canal stenosis at C5-6, mild-to-moderate C4-5. Severe neural foraminal narrowing C3-4 thru C6-7.  IMPRESSION: CT head:  No acute intracranial process.  Stable left parafalcine 11 mm meningioma without mass effect. Involutional changes. Mild white matter changes suggest chronic small vessel ischemic disease.  CT cervical spine: No acute cervical spine fracture. Grade 1 C2-3 and grade 1 C7-T1 anterolisthesis with on degenerative basis.  Asymmetric fullness of the right pharyngeal soft tissues, which could reflect prominent vascular structure, less likely mass in would be better characterized on CT or MRI of the neck with contrast on a nonemergent basis, as clinically indicated.   Electronically Signed   By: Elon Alas   On: 08/26/2013 03:22    EKG: Independently reviewed. normal sinus rhythm, nonspecific ST and T waves changes.  Assessment/Plan Principal Problem:   Dizziness Active Problems:   Hypertension   Restless legs syndrome   History of TIAs   Aortic stenosis   Fall   Abnormal stress test   1. Dizziness The patient is presenting with symptoms of dizziness and lightheadedness. She denies any loss of consciousness. She does not  appear to have any arrhythmias on EKG. She has increased burning urination had an episode of diarrhea x3 over one day. Possibly her dizziness and falling secondary to vasovagal but due to her cardiac history of cardiogenic syncope cannot be ruled out. At present she'll be admitted to the hospital I would continue monitoring on telemetry obtain a troponin. She recently had workup related to her heart, she may require cardiology consult if becomes symptomatic again.  2. Right hip pain Patient mentions she is unable to walk without any pain at present. Next present I will get her to the hospital continue her on Augmentin and Dilaudid at home and also continue her on OxyIR. Physical therapy for further workup  3. Hypertension Continue the losartan  4. Restless  leg syndrome Continue with Ropinirole  DVT Prophylaxis: subcutaneous Heparin Nutrition: Cardiac  Code Status: Full  Disposition: Admitted to observation in telemetry unit.  Author: Berle Mull, MD [facility] Pager: [phone] 08/26/2013, 6:27 AM    If 7PM-7AM, please contact night-coverage [email or web address] Password [email or web address]

## 2013-08-26 NOTE — Telephone Encounter (Signed)
Message     Medical therapy; add ASA 81 mg daily    Shelly Martin    Pt dtr aware, she reports the pt is currently in the hosp with pelvic fx.

## 2013-08-26 NOTE — Consult Note (Signed)
Reason for Consult:right hip pain Referring Physician: Medicine  HPI: Shelly Martin is an 80 y.o. female with a Past medical history of hypertension, restless leg syndrome, chronic right hip pain for AVN, TIA, aortic stenosis, recent low-risk stress nuclear study with possible mild ischemia mid lateral wall segment, recent fall with c/o increased right hip pain. Tentatively scheduled for hip replacement with Dr. Wynelle Link in August   Past Medical History  Diagnosis Date  . Hypertension   . Restless legs syndrome     on Requip  . Ulcer causing bleeding and hole in wall of stomach or small intestine   . Gastric ulcer     years ago  . Arthritis     Back, knees  . Constipation   . Urinary bladder calculus   . Urinary incontinence   . Bowel incontinence   . Aortic stenosis   . Stroke     Mini, no residual    Past Surgical History  Procedure Laterality Date  . Colon resection  50 years ago  . Tonsillectomy    . Uterine fibroid surgery    . Lumbar laminectomy/decompression microdiscectomy  06/25/2011    Procedure: LUMBAR LAMINECTOMY/DECOMPRESSION MICRODISCECTOMY;  Surgeon: Hosie Spangle, MD;  Location: [facility];  Service: Neurosurgery;  Laterality: Right;  RIGHT Lumbar Two-Three hemilaminectomy and microdiskectomy  . Vaginal hysterectomy      Family History  Problem Relation Age of Onset  . Anesthesia problems Neg Hx   . Pancreatic cancer Brother   . Dementia Mother   . Stroke Father     Social History:  reports that she has never smoked. She has never used smokeless tobacco. She reports that she drinks alcohol. She reports that she does not use illicit drugs.  Allergies:  Allergies  Allergen Reactions  . Amoxicillin Other (See Comments)    Reaction unknown  . Baclofen Other (See Comments)    "fuzzy in the eyes" and dizzy  . Hctz [Hydrochlorothiazide]     nausea  . Valium [Diazepam] Other (See Comments)    Pt became unresponsive and O2 sats dropped.     Medications: I have reviewed the patient's current medications.  Results for orders placed during the hospital encounter of 08/26/13 (from the past 48 hour(s))  CBC     Status: Abnormal   Collection Time    08/26/13  1:37 AM      Result Value Ref Range   WBC 10.2  4.0 - 10.5 K/uL   RBC 3.79 (*) 3.87 - 5.11 MIL/uL   Hemoglobin 11.3 (*) 12.0 - 15.0 g/dL   HCT 33.9 (*) 36.0 - 46.0 %   MCV 89.4  78.0 - 100.0 fL   MCH 29.8  26.0 - 34.0 pg   MCHC 33.3  30.0 - 36.0 g/dL   RDW 13.7  11.5 - 15.5 %   Platelets 277  150 - 400 K/uL  POC OCCULT BLOOD, ED     Status: None   Collection Time    08/26/13  1:50 AM      Result Value Ref Range   Fecal Occult Bld NEGATIVE  NEGATIVE  COMPREHENSIVE METABOLIC PANEL     Status: Abnormal   Collection Time    08/26/13  2:30 AM      Result Value Ref Range   Sodium 140  137 - 147 mEq/L   Potassium 4.1  3.7 - 5.3 mEq/L   Chloride 102  96 - 112 mEq/L   CO2 26  19 - 32  mEq/L   Glucose, Bld 105 (*) 70 - 99 mg/dL   BUN 32 (*) 6 - 23 mg/dL   Creatinine, Ser 0.80  0.50 - 1.10 mg/dL   Calcium 8.8  8.4 - 10.5 mg/dL   Total Protein 6.0  6.0 - 8.3 g/dL   Albumin 3.4 (*) 3.5 - 5.2 g/dL   AST 18  0 - 37 U/L   ALT 10  0 - 35 U/L   Alkaline Phosphatase 70  39 - 117 U/L   Total Bilirubin 0.6  0.3 - 1.2 mg/dL   GFR calc non Af Amer 62 (*) >90 mL/min   GFR calc Af Amer 71 (*) >90 mL/min   Comment: (NOTE)     The eGFR has been calculated using the CKD EPI equation.     This calculation has not been validated in all clinical situations.     eGFR's persistently <90 mL/min signify possible Chronic Kidney     Disease.  TROPONIN I     Status: None   Collection Time    08/26/13  2:30 AM      Result Value Ref Range   Troponin I <0.30  <0.30 ng/mL   Comment:            Due to the release kinetics of cTnI,     a negative result within the first hours     of the onset of symptoms does not rule out     myocardial infarction with certainty.     If myocardial  infarction is still suspected,     repeat the test at appropriate intervals.  URINALYSIS, ROUTINE W REFLEX MICROSCOPIC     Status: Abnormal   Collection Time    08/26/13  3:21 AM      Result Value Ref Range   Color, Urine YELLOW  YELLOW   APPearance CLOUDY (*) CLEAR   Specific Gravity, Urine 1.025  1.005 - 1.030   pH 6.0  5.0 - 8.0   Glucose, UA NEGATIVE  NEGATIVE mg/dL   Hgb urine dipstick NEGATIVE  NEGATIVE   Bilirubin Urine NEGATIVE  NEGATIVE   Ketones, ur NEGATIVE  NEGATIVE mg/dL   Protein, ur NEGATIVE  NEGATIVE mg/dL   Urobilinogen, UA 0.2  0.0 - 1.0 mg/dL   Nitrite NEGATIVE  NEGATIVE   Leukocytes, UA MODERATE (*) NEGATIVE  URINE MICROSCOPIC-ADD ON     Status: Abnormal   Collection Time    08/26/13  3:21 AM      Result Value Ref Range   Squamous Epithelial / LPF FEW (*) RARE   WBC, UA 7-10  <3 WBC/hpf   Bacteria, UA MANY (*) RARE  TROPONIN I     Status: None   Collection Time    08/26/13  8:00 AM      Result Value Ref Range   Troponin I <0.30  <0.30 ng/mL   Comment:            Due to the release kinetics of cTnI,     a negative result within the first hours     of the onset of symptoms does not rule out     myocardial infarction with certainty.     If myocardial infarction is still suspected,     repeat the test at appropriate intervals.    Dg Chest 1 View  08/26/2013   CLINICAL DATA:  Fall, right hip pain  EXAM: CHEST - 1 VIEW  COMPARISON:  Prior chest x-ray 06/29/2013  FINDINGS: Stable  cardiomegaly with left heart enlargement. Mediastinal contours are unchanged. Low inspiratory volumes. No pleural effusion, pneumothorax or focal airspace consolidation. No suspicious pulmonary mass or nodule. No acute osseous abnormality. Mild degenerative changes in the right shoulder.  IMPRESSION: No active disease.   Electronically Signed   By: Jacqulynn Cadet M.D.   On: 08/26/2013 02:31   Dg Pelvis 1-2 Views  08/26/2013   CLINICAL DATA:  Fall, right hip pain  EXAM: PELVIS - 1-2  VIEW  COMPARISON:  Prior abdominal radiographs 09/30/2012  FINDINGS: The bones are diffusely osteopenic. Numerous surgical ligatures project over the lower abdomen and pelvis. Linear artifact projects over the pelvis secondary to multiple cardiac leads. No acute fracture or malalignment identified. Compared to May of 2014 there has been significant interval progression of right hip osteoarthritis with increased loss of the superolateral joint space and developing subchondral cyst in the acetabulum. Additionally, the femoral head is sclerotic and demonstrates irregularity of the articular surface. Overall findings are concerning for progressive avascular necrosis and secondary osteoarthritis. Lower lumbar degenerative disc disease and facet arthropathy.  IMPRESSION: 1. No acute fracture identified. Marked osteopenia limits evaluation for nondisplaced fractures. 2. Findings suggest developing right femoral head avascular necrosis and progressive secondary right hip osteoarthritis compared to 09/30/2012.   Electronically Signed   By: Jacqulynn Cadet M.D.   On: 08/26/2013 02:30   Ct Head Wo Contrast  08/26/2013   CLINICAL DATA:  Unwitnessed fall, dizziness.  EXAM: CT HEAD WITHOUT CONTRAST  CT CERVICAL SPINE WITHOUT CONTRAST  TECHNIQUE: Multidetector CT imaging of the head and cervical spine was performed following the standard protocol without intravenous contrast. Multiplanar CT image reconstructions of the cervical spine were also generated.  COMPARISON:  CT HEAD W/O CM dated 06/03/2013; CT MAXILLOFACIAL W/O CM dated 06/03/2013; DG FLUORO GUIDE NDL PLC/BX dated 05/20/2012; CT HEAD W/O CM dated 12/20/2011  FINDINGS: CT HEAD FINDINGS  The ventricles and sulci are normal for age. No intraparenchymal hemorrhage, mass effect nor midline shift. Patchy supratentorial white matter hypodensities are less than expected for patient's age and though non-specific suggest sequelae of chronic small vessel ischemic disease. No acute  large vascular territory infarcts.  No abnormal extra-axial fluid collections. 11 mm left parafalcine meningioma again seen without mass effect. Basal cisterns are patent. Moderate calcific atherosclerosis of the carotid siphons.  No skull fracture. Trace paranasal sinus mucosal thickening without air-fluid levels. The mastoid air cells are well aerated. . The included ocular globes and orbital contents are non-suspicious. Status post right ocular lens implant.  CT CERVICAL SPINE FINDINGS  Cervical vertebral bodies and posterior elements are intact. Grade 1 C2-3 anterolisthesis, grade 1 C7-T1 anterolisthesis on degenerative basis. Severe C3-4 thru C6-7 degenerative disc disease. C1-2 articulation maintained with severe arthropathy and possible small os auto none 4 8 a.m. Mild broad dextroscoliosis. No destructive bony lesions.  Asymmetric fullness of the right pharyngeal soft tissues, with coarse calcification, axial 41/80. This could reflect very tortuous vascular structures or mass contiguous with the right sternocleidomastoid muscle posteriorly. This is stable from June 03, 2013  Degenerative disc disease and facet arthropathy results in severe canal stenosis at C5-6, mild-to-moderate C4-5. Severe neural foraminal narrowing C3-4 thru C6-7.  IMPRESSION: CT head:  No acute intracranial process.  Stable left parafalcine 11 mm meningioma without mass effect. Involutional changes. Mild white matter changes suggest chronic small vessel ischemic disease.  CT cervical spine: No acute cervical spine fracture. Grade 1 C2-3 and grade 1 C7-T1 anterolisthesis with on degenerative basis.  Asymmetric fullness of the right pharyngeal soft tissues, which could reflect prominent vascular structure, less likely mass in would be better characterized on CT or MRI of the neck with contrast on a nonemergent basis, as clinically indicated.   Electronically Signed   By: Elon Alas   On: 08/26/2013 03:22   Ct Cervical Spine Wo  Contrast  08/26/2013   CLINICAL DATA:  Unwitnessed fall, dizziness.  EXAM: CT HEAD WITHOUT CONTRAST  CT CERVICAL SPINE WITHOUT CONTRAST  TECHNIQUE: Multidetector CT imaging of the head and cervical spine was performed following the standard protocol without intravenous contrast. Multiplanar CT image reconstructions of the cervical spine were also generated.  COMPARISON:  CT HEAD W/O CM dated 06/03/2013; CT MAXILLOFACIAL W/O CM dated 06/03/2013; DG FLUORO GUIDE NDL PLC/BX dated 05/20/2012; CT HEAD W/O CM dated 12/20/2011  FINDINGS: CT HEAD FINDINGS  The ventricles and sulci are normal for age. No intraparenchymal hemorrhage, mass effect nor midline shift. Patchy supratentorial white matter hypodensities are less than expected for patient's age and though non-specific suggest sequelae of chronic small vessel ischemic disease. No acute large vascular territory infarcts.  No abnormal extra-axial fluid collections. 11 mm left parafalcine meningioma again seen without mass effect. Basal cisterns are patent. Moderate calcific atherosclerosis of the carotid siphons.  No skull fracture. Trace paranasal sinus mucosal thickening without air-fluid levels. The mastoid air cells are well aerated. . The included ocular globes and orbital contents are non-suspicious. Status post right ocular lens implant.  CT CERVICAL SPINE FINDINGS  Cervical vertebral bodies and posterior elements are intact. Grade 1 C2-3 anterolisthesis, grade 1 C7-T1 anterolisthesis on degenerative basis. Severe C3-4 thru C6-7 degenerative disc disease. C1-2 articulation maintained with severe arthropathy and possible small os auto none 4 8 a.m. Mild broad dextroscoliosis. No destructive bony lesions.  Asymmetric fullness of the right pharyngeal soft tissues, with coarse calcification, axial 41/80. This could reflect very tortuous vascular structures or mass contiguous with the right sternocleidomastoid muscle posteriorly. This is stable from June 03, 2013   Degenerative disc disease and facet arthropathy results in severe canal stenosis at C5-6, mild-to-moderate C4-5. Severe neural foraminal narrowing C3-4 thru C6-7.  IMPRESSION: CT head:  No acute intracranial process.  Stable left parafalcine 11 mm meningioma without mass effect. Involutional changes. Mild white matter changes suggest chronic small vessel ischemic disease.  CT cervical spine: No acute cervical spine fracture. Grade 1 C2-3 and grade 1 C7-T1 anterolisthesis with on degenerative basis.  Asymmetric fullness of the right pharyngeal soft tissues, which could reflect prominent vascular structure, less likely mass in would be better characterized on CT or MRI of the neck with contrast on a nonemergent basis, as clinically indicated.   Electronically Signed   By: Elon Alas   On: 08/26/2013 03:22   Ct Hip Right Wo Contrast  08/26/2013   CLINICAL DATA:  Pain without trauma  EXAM: CT OF THE RIGHT HIP WITHOUT CONTRAST  TECHNIQUE: Multidetector CT imaging was performed according to the standard protocol. Multiplanar CT image reconstructions were also generated.  COMPARISON:  Radiographs from earlier the same day, and earlier studies  FINDINGS: Minimally displaced fracture of the superior right pubic ramus. No pelvic hematoma. Proximal femur intact. Advanced right hip osteoarthritis with bone on bone apposition superiorly. Subchondral cysts/ geodes in the superior acetabulum and femoral head. Subchondral sclerosis in the superior acetabulum and femoral head. There is some mild remodeling of the femoral head. Marginal spurs from the acetabulum and femoral head.  Patchy iliofemoral arterial  calcifications. Anterior abdominal wall suture material.  IMPRESSION: 1. Minimally displaced fracture, superior right pubic ramus. 2. Advanced right hip osteoarthritis.   Electronically Signed   By: Arne Cleveland M.D.   On: 08/26/2013 09:27     Vitals Temp:  [97.9 F (36.6 C)-99 F (37.2 C)] 97.9 F (36.6 C)  (04/03 1040) Pulse Rate:  [63-84] 84 (04/03 0814) Resp:  [12-25] 16 (04/03 0633) BP: (155-197)/(60-79) 180/70 mmHg (04/03 0814) SpO2:  [98 %-100 %] 100 % (04/03 0814) Weight:  [49.442 kg (109 lb)] 49.442 kg (109 lb) (04/03 4591) Body mass index is 21.29 kg/(m^2).  Physical Exam: Thin WF, A and O, HOH, reports right groin pain since fall in addition to her chronic right hip pain. Tender with palpation about the right hip and groin, no masses or lymphadenopathy. Marked pain with attempts at hip motion, moves ankles and toes well, grossly N/V intact distally BLE's. Reports mild right shoulder pain for several months with exam today showing functional range of motion and relatively good strength and no crepitance.     Assessment/Plan: Impression:  1 end stage right hip OA exacerbated after recent fall 2 nondisplaced right superior pubic ramus fracture  Treatment: The acute pubic ramus fracture is a stable fracture and should heal well with conservative management. She may be weight bearing as tolerated on the right lower extremity. Will SW consult for d/c planning, follow up with Dr. Wynelle Link in 6 weeks for repeat xrays  Aleisha Paone M 08/26/2013, 10:39 AM

## 2013-08-26 NOTE — ED Notes (Signed)
Patient transported to X-ray 

## 2013-08-26 NOTE — Care Management Note (Signed)
CARE MANAGEMENT NOTE 08/26/2013  Patient:  Shelly Martin, Shelly Martin   Account Number:  [id number]  Date Initiated:  08/26/2013  Documentation initiated by:  Ricki Miller  Subjective/Objective Assessment:   80 yr old female admitted for dizzyness, s/p fall.     Action/Plan:   Case manager spoke with patient concerning needs at discharge. She states she plans to go home with assistance.States she will go to SNF after hip replacement. Will continue to monitor.   Anticipated DC Date:  08/27/2013   Anticipated DC Plan:           Choice offered to / List presented to:             Status of service:  In process, will continue to follow

## 2013-08-26 NOTE — Evaluation (Signed)
Physical Therapy Evaluation Patient Details Name: Shelly Martin MRN: [id number] DOB: [birth year] Today's Date: 08/26/2013   History of Present Illness  Pt. admitted following a fall (syncopal episode) resulting in R superior pubic ramus fx, non displaced.  Pt. also with end stage OA of R hip and had THA scheduled tentatively for August.    Clinical Impression  This 80 year old lady who appears and acts younger than stated age had syncopal episode with fall resulting in pelvis fx and difficulty with mobility and walking.  She needs acute PT to address functional mobility and gait to allow for safe DC home with anticipated 24 hour assist.  Despite her pain, she did quite well first time up with PT, and I anticipate good progress.    Follow Up Recommendations Home health PT;Supervision/Assistance - 24 hour;Supervision for mobility/OOB    Equipment Recommendations  None recommended by PT;Other (comment) (defer bathroom equipment to OT)    Recommendations for Other Services OT consult     Precautions / Restrictions Precautions Precautions: Fall;Other (comment) (nursing staff checking orthostatics) Restrictions Weight Bearing Restrictions: Yes RLE Weight Bearing: Weight bearing as tolerated      Mobility  Bed Mobility Overal bed mobility: Needs Assistance Bed Mobility: Supine to Sit     Supine to sit: Min assist     General bed mobility comments: min assist  and verbal cues for technique  Transfers Overall transfer level: Needs assistance Equipment used: Rolling walker (2 wheeled) Transfers: Sit to/from Stand Sit to Stand: +2 physical assistance;Min assist         General transfer comment: vc's for hand placement and technique, min assist of 2 to rise to stand  Ambulation/Gait Ambulation/Gait assistance: Min assist Ambulation Distance (Feet): 5 Feet Assistive device: Rolling walker (2 wheeled) Gait Pattern/deviations: Step-to pattern;Decreased stance time -  right;Antalgic        Stairs            Wheelchair Mobility    Modified Rankin (Stroke Patients Only)       Balance                                             Pertinent Vitals/Pain See vitals tab Pt. Quotes pain as "just a little over 10" but was able to mobilize despite her discomfort without distress.    Home Living Family/patient expects to be discharged to:: Private residence Living Arrangements: Children (daughter) Available Help at Discharge: Available PRN/intermittently Type of Home: House Home Access: Stairs to enter Entrance Stairs-Rails: Right;Left;Can reach both Entrance Stairs-Number of Steps: 4 Home Layout: One level Home Equipment: Environmental consultant - 2 wheels      Prior Function Level of Independence: Independent with assistive device(s)               Hand Dominance        Extremity/Trunk Assessment   Upper Extremity Assessment: Overall WFL for tasks assessed           Lower Extremity Assessment: Overall WFL for tasks assessed         Communication   Communication: No difficulties  Cognition Arousal/Alertness: Awake/alert Behavior During Therapy: WFL for tasks assessed/performed Overall Cognitive Status: Within Functional Limits for tasks assessed                      General Comments      Exercises  General Exercises - Lower Extremity Ankle Circles/Pumps: AROM;Both;10 reps      Assessment/Plan    PT Assessment Patient needs continued PT services  PT Diagnosis Difficulty walking;Abnormality of gait;Acute pain   PT Problem List Decreased activity tolerance;Decreased mobility;Decreased knowledge of use of DME;Decreased knowledge of precautions;Pain;Cardiopulmonary status limiting activity  PT Treatment Interventions DME instruction;Gait training;Stair training;Functional mobility training;Therapeutic activities;Therapeutic exercise;Patient/family education   PT Goals (Current goals can be found in  the Care Plan section) Acute Rehab PT Goals Patient Stated Goal: home for recovery and hopeful THA later in the year PT Goal Formulation: With patient Time For Goal Achievement: 09/02/13 Potential to Achieve Goals: Good    Frequency Min 6X/week   Barriers to discharge Decreased caregiver support Pt. states she believes she can arrange 24 hour care in the home as hse daughter works.      Co-evaluation               End of Session Equipment Utilized During Treatment: Gait belt Activity Tolerance: Patient tolerated treatment well Patient left: in chair;with call bell/phone within reach Nurse Communication: Mobility status;Patient requests pain meds;Weight bearing status    Functional Assessment Tool Used: clinicaal judgement/observation Functional Limitation: Mobility: Walking and moving around Mobility: Walking and Moving Around Current Status ([id number]): At least 20 percent but less than 40 percent impaired, limited or restricted Mobility: Walking and Moving Around Goal Status [phone]): At least 1 percent but less than 20 percent impaired, limited or restricted    Time: 1406-1430 PT Time Calculation (min): 24 min   Charges:   PT Evaluation $Initial PT Evaluation Tier I: 1 Procedure PT Treatments $Gait Training: 8-22 mins   PT G Codes:   Functional Assessment Tool Used: clinicaal judgement/observation Functional Limitation: Mobility: Walking and moving around    [place] 08/26/2013, 3:33 PM Gerlean Ren PT Acute Rehab Services [phone] [place] [phone]

## 2013-08-26 NOTE — Progress Notes (Signed)
INITIAL NUTRITION ASSESSMENT  DOCUMENTATION CODES Per approved criteria  -Not Applicable   INTERVENTION: Ensure Complete po BID, each supplement provides 350 kcal and 13 grams of protein  NUTRITION DIAGNOSIS: Inadequate oral intake related to decreased appetite as evidenced by meal Completion: <50%.   Goal: Pt to meet >/= 90% of their estimated nutrition needs   Monitor:  PO intake, supplement acceptance, weight trend  Reason for Assessment: Pt identified as at nutrition risk on the Malnutrition Screen Tool  80 y.o. female  Admitting Dx: Dizziness  ASSESSMENT: Pt admitted after fall. Pt with fx, undergoing pre-surgical work up. Pt lives with daughter. Per pt she has lost 5% of her weight in the last year. Pt states that she is unable to think straight (notified RN) and cannot answer what she usually eats, does state that her daughter does the cooking. Pt ate 75% at breakfast but only a couple of bites of her cake at lunch.  Noted some wasting that appears to be age related.   Nutrition Focused Physical Exam:  Subcutaneous Fat:  Orbital Region: WNL Upper Arm Region: WNL Thoracic and Lumbar Region: WNL  Muscle:  Temple Region: WNL Clavicle Bone Region: mild wasting Clavicle and Acromion Bone Region: mild wasting Scapular Bone Region: WNL Dorsal Hand: mild wasting Patellar Region: WNL Anterior Thigh Region: WNL Posterior Calf Region: WNL  Edema: not present   Height: Ht Readings from Last 1 Encounters:  08/26/13 5' (1.524 m)    Weight: Wt Readings from Last 1 Encounters:  08/26/13 109 lb (49.442 kg)    Ideal Body Weight: 45.4 kg   % Ideal Body Weight: 109%  Wt Readings from Last 10 Encounters:  08/26/13 109 lb (49.442 kg)  08/22/13 110 lb (49.896 kg)  01/04/13 114 lb (51.71 kg)  11/02/12 117 lb (53.071 kg)  10/05/12 117 lb (53.071 kg)  08/18/12 118 lb 12.8 oz (53.887 kg)  07/21/12 123 lb 6.4 oz (55.974 kg)  07/05/12 124 lb (56.246 kg)  06/29/12 126  lb (57.153 kg)  06/24/12 120 lb 2.4 oz (54.5 kg)    Usual Body Weight: 115 lb   % Usual Body Weight: 95%  BMI:  Body mass index is 21.29 kg/(m^2).  Estimated Nutritional Needs: Kcal: 1250-1400 Protein: 55-65 grams Fluid: >1.5 L/day  Skin: no issues noted  Diet Order: Cardiac  EDUCATION NEEDS: -No education needs identified at this time   Intake/Output Summary (Last 24 hours) at 08/26/13 1319 Last data filed at 08/26/13 1222  Gross per 24 hour  Intake    360 ml  Output      3 ml  Net    357 ml    Last BM: PTA   Labs:   Recent Labs Lab 08/26/13 0230  NA 140  K 4.1  CL 102  CO2 26  BUN 32*  CREATININE 0.80  CALCIUM 8.8  GLUCOSE 105*    CBG (last 3)  No results found for this basename: GLUCAP,  in the last 72 hours  Scheduled Meds: . aspirin  81 mg Oral Daily  . ciprofloxacin  250 mg Oral BID  . enoxaparin (LOVENOX) injection  40 mg Subcutaneous Q24H  . gabapentin  300 mg Oral BID WC   And  . gabapentin  600 mg Oral QHS  . losartan  50 mg Oral BID  . rOPINIRole  2 mg Oral QHS  . sodium chloride  3 mL Intravenous Q12H    Continuous Infusions: . sodium chloride 75 mL/hr (08/26/13 1053)  Past Medical History  Diagnosis Date  . Hypertension   . Restless legs syndrome     on Requip  . Ulcer causing bleeding and hole in wall of stomach or small intestine   . Gastric ulcer     years ago  . Arthritis     Back, knees  . Constipation   . Urinary bladder calculus   . Urinary incontinence   . Bowel incontinence   . Aortic stenosis   . Stroke     Mini, no residual    Past Surgical History  Procedure Laterality Date  . Colon resection  50 years ago  . Tonsillectomy    . Uterine fibroid surgery    . Lumbar laminectomy/decompression microdiscectomy  06/25/2011    Procedure: LUMBAR LAMINECTOMY/DECOMPRESSION MICRODISCECTOMY;  Surgeon: Hosie Spangle, MD;  Location: [facility];  Service: Neurosurgery;  Laterality: Right;  RIGHT Lumbar  Two-Three hemilaminectomy and microdiskectomy  . Vaginal hysterectomy      Maylon Peppers RD, [place], [place] Pager [phone] After Hours Pager

## 2013-08-26 NOTE — Consult Note (Addendum)
HPI: 80 year old female for preoperative evaluation prior to hip replacement. Nuclear study March 2015 showed an ejection fraction of 64% with possible mild ischemia in the mid lateral segment. Study felt low risk. Echocardiogram in March of 2015 revealed normal LV function, moderate aortic stenosis with a mean gradient of 26 mm of mercury, mild mitral stenosis and mild left atrial enlargement. ppatient has limited mobility because of hip pain. She denies dyspnea on exertion, orthopnea, PND, pedal edema, chest pain or palpitations. Yesterday she arose from bed and became dizzy. She had a frank syncopal episode. CT of right hip shows minimally displaced fracture superior right pubic ramus. Cardiology asked to evaluate preoperatively.  Medications Prior to Admission  Medication Sig Dispense Refill  . gabapentin (NEURONTIN) 300 MG capsule Take 300-600 mg by mouth See admin instructions. Take 300mg  in the morning, 300mg  at 12, and 2 tablets (600mg ) in the evening.      Marland Kitchen HYDROmorphone (DILAUDID) 4 MG tablet Take 4 mg by mouth every 6 (six) hours as needed for severe pain.       Marland Kitchen ibuprofen (ADVIL,MOTRIN) 200 MG tablet Take 200-400 mg by mouth 3 (three) times daily as needed for moderate pain.       Marland Kitchen losartan (COZAAR) 50 MG tablet TAKE 1 TABLET BY MOUTH TWICE DAILY      . rOPINIRole (REQUIP) 2 MG tablet Take 2 mg by mouth at bedtime. 1-2 tab bid prn        Allergies  Allergen Reactions  . Amoxicillin Other (See Comments)    Reaction unknown  . Baclofen Other (See Comments)    "fuzzy in the eyes" and dizzy  . Hctz [Hydrochlorothiazide]     nausea  . Valium [Diazepam] Other (See Comments)    Pt became unresponsive and O2 sats dropped.    Past Medical History  Diagnosis Date  . Hypertension   . Restless legs syndrome     on Requip  . Ulcer causing bleeding and hole in wall of stomach or small intestine   . Gastric ulcer     years ago  . Arthritis     Back, knees  . Constipation   .  Urinary bladder calculus   . Urinary incontinence   . Bowel incontinence   . Aortic stenosis   . Stroke     Mini, no residual    Past Surgical History  Procedure Laterality Date  . Colon resection  50 years ago  . Tonsillectomy    . Uterine fibroid surgery    . Lumbar laminectomy/decompression microdiscectomy  06/25/2011    Procedure: LUMBAR LAMINECTOMY/DECOMPRESSION MICRODISCECTOMY;  Surgeon: Hosie Spangle, MD;  Location: [facility];  Service: Neurosurgery;  Laterality: Right;  RIGHT Lumbar Two-Three hemilaminectomy and microdiskectomy  . Vaginal hysterectomy      History   Social History  . Marital Status: Widowed    Spouse Name: N/A    Number of Children: 2  . Years of Education: N/A   Occupational History  . Retired    Social History Main Topics  . Smoking status: Never Smoker   . Smokeless tobacco: Never Used  . Alcohol Use: Yes     Comment: rarely  . Drug Use: No  . Sexual Activity: Not Currently   Other Topics Concern  . Not on file   Social History Narrative  . No narrative on file    Family History  Problem Relation Age of Onset  . Anesthesia problems Neg Hx   .  Pancreatic cancer Brother   . Dementia Mother   . Stroke Father     ROS:  Chronic hip pain but no fevers or chills, productive cough, hemoptysis, dysphasia, odynophagia, melena, hematochezia, dysuria, hematuria, rash, seizure activity, orthopnea, PND, pedal edema, claudication. Remaining systems are negative.  Physical Exam:   Blood pressure 180/70, pulse 84, temperature 97.9 F (36.6 C), temperature source Oral, resp. rate 16, height 5' (1.524 m), weight 109 lb (49.442 kg), SpO2 100.00%.  General:  Well developed/well nourished in NAD Skin warm/dry Patient not depressed No peripheral clubbing Back-normal HEENT-normal/normal eyelids Neck supple/normal carotid upstroke bilaterally; no bruits; no JVD; no thyromegaly chest - CTA/ normal expansion CV - RRR/normal S1 and S2; 3/6  systolic murmur LSB; S2 not diminished;  PMI nondisplaced Abdomen -NT/ND, no HSM, no mass, + bowel sounds, no bruit 2+ femoral pulses, no bruits Ext-no edema, chords, 2+ DP Neuro-grossly nonfocal  ECG 08/27/2011-sinus rhythm, left axis deviation.  Results for orders placed during the hospital encounter of 08/26/13 (from the past 48 hour(s))  CBC     Status: Abnormal   Collection Time    08/26/13  1:37 AM      Result Value Ref Range   WBC 10.2  4.0 - 10.5 K/uL   RBC 3.79 (*) 3.87 - 5.11 MIL/uL   Hemoglobin 11.3 (*) 12.0 - 15.0 g/dL   HCT 33.9 (*) 36.0 - 46.0 %   MCV 89.4  78.0 - 100.0 fL   MCH 29.8  26.0 - 34.0 pg   MCHC 33.3  30.0 - 36.0 g/dL   RDW 13.7  11.5 - 15.5 %   Platelets 277  150 - 400 K/uL  POC OCCULT BLOOD, ED     Status: None   Collection Time    08/26/13  1:50 AM      Result Value Ref Range   Fecal Occult Bld NEGATIVE  NEGATIVE  COMPREHENSIVE METABOLIC PANEL     Status: Abnormal   Collection Time    08/26/13  2:30 AM      Result Value Ref Range   Sodium 140  137 - 147 mEq/L   Potassium 4.1  3.7 - 5.3 mEq/L   Chloride 102  96 - 112 mEq/L   CO2 26  19 - 32 mEq/L   Glucose, Bld 105 (*) 70 - 99 mg/dL   BUN 32 (*) 6 - 23 mg/dL   Creatinine, Ser 0.80  0.50 - 1.10 mg/dL   Calcium 8.8  8.4 - 10.5 mg/dL   Total Protein 6.0  6.0 - 8.3 g/dL   Albumin 3.4 (*) 3.5 - 5.2 g/dL   AST 18  0 - 37 U/L   ALT 10  0 - 35 U/L   Alkaline Phosphatase 70  39 - 117 U/L   Total Bilirubin 0.6  0.3 - 1.2 mg/dL   GFR calc non Af Amer 62 (*) >90 mL/min   GFR calc Af Amer 71 (*) >90 mL/min   Comment: (NOTE)     The eGFR has been calculated using the CKD EPI equation.     This calculation has not been validated in all clinical situations.     eGFR's persistently <90 mL/min signify possible Chronic Kidney     Disease.  TROPONIN I     Status: None   Collection Time    08/26/13  2:30 AM      Result Value Ref Range   Troponin I <0.30  <0.30 ng/mL   Comment:  Due to the  release kinetics of cTnI,     a negative result within the first hours     of the onset of symptoms does not rule out     myocardial infarction with certainty.     If myocardial infarction is still suspected,     repeat the test at appropriate intervals.  URINALYSIS, ROUTINE W REFLEX MICROSCOPIC     Status: Abnormal   Collection Time    08/26/13  3:21 AM      Result Value Ref Range   Color, Urine YELLOW  YELLOW   APPearance CLOUDY (*) CLEAR   Specific Gravity, Urine 1.025  1.005 - 1.030   pH 6.0  5.0 - 8.0   Glucose, UA NEGATIVE  NEGATIVE mg/dL   Hgb urine dipstick NEGATIVE  NEGATIVE   Bilirubin Urine NEGATIVE  NEGATIVE   Ketones, ur NEGATIVE  NEGATIVE mg/dL   Protein, ur NEGATIVE  NEGATIVE mg/dL   Urobilinogen, UA 0.2  0.0 - 1.0 mg/dL   Nitrite NEGATIVE  NEGATIVE   Leukocytes, UA MODERATE (*) NEGATIVE  URINE MICROSCOPIC-ADD ON     Status: Abnormal   Collection Time    08/26/13  3:21 AM      Result Value Ref Range   Squamous Epithelial / LPF FEW (*) RARE   WBC, UA 7-10  <3 WBC/hpf   Bacteria, UA MANY (*) RARE  TROPONIN I     Status: None   Collection Time    08/26/13  8:00 AM      Result Value Ref Range   Troponin I <0.30  <0.30 ng/mL   Comment:            Due to the release kinetics of cTnI,     a negative result within the first hours     of the onset of symptoms does not rule out     myocardial infarction with certainty.     If myocardial infarction is still suspected,     repeat the test at appropriate intervals.    Dg Chest 1 View  08/26/2013   CLINICAL DATA:  Fall, right hip pain  EXAM: CHEST - 1 VIEW  COMPARISON:  Prior chest x-ray 06/29/2013  FINDINGS: Stable cardiomegaly with left heart enlargement. Mediastinal contours are unchanged. Low inspiratory volumes. No pleural effusion, pneumothorax or focal airspace consolidation. No suspicious pulmonary mass or nodule. No acute osseous abnormality. Mild degenerative changes in the right shoulder.  IMPRESSION: No active  disease.   Electronically Signed   By: Jacqulynn Cadet M.D.   On: 08/26/2013 02:31   Dg Pelvis 1-2 Views  08/26/2013   CLINICAL DATA:  Fall, right hip pain  EXAM: PELVIS - 1-2 VIEW  COMPARISON:  Prior abdominal radiographs 09/30/2012  FINDINGS: The bones are diffusely osteopenic. Numerous surgical ligatures project over the lower abdomen and pelvis. Linear artifact projects over the pelvis secondary to multiple cardiac leads. No acute fracture or malalignment identified. Compared to May of 2014 there has been significant interval progression of right hip osteoarthritis with increased loss of the superolateral joint space and developing subchondral cyst in the acetabulum. Additionally, the femoral head is sclerotic and demonstrates irregularity of the articular surface. Overall findings are concerning for progressive avascular necrosis and secondary osteoarthritis. Lower lumbar degenerative disc disease and facet arthropathy.  IMPRESSION: 1. No acute fracture identified. Marked osteopenia limits evaluation for nondisplaced fractures. 2. Findings suggest developing right femoral head avascular necrosis and progressive secondary right hip osteoarthritis compared to 09/30/2012.  Electronically Signed   By: Jacqulynn Cadet M.D.   On: 08/26/2013 02:30   Ct Head Wo Contrast  08/26/2013   CLINICAL DATA:  Unwitnessed fall, dizziness.  EXAM: CT HEAD WITHOUT CONTRAST  CT CERVICAL SPINE WITHOUT CONTRAST  TECHNIQUE: Multidetector CT imaging of the head and cervical spine was performed following the standard protocol without intravenous contrast. Multiplanar CT image reconstructions of the cervical spine were also generated.  COMPARISON:  CT HEAD W/O CM dated 06/03/2013; CT MAXILLOFACIAL W/O CM dated 06/03/2013; DG FLUORO GUIDE NDL PLC/BX dated 05/20/2012; CT HEAD W/O CM dated 12/20/2011  FINDINGS: CT HEAD FINDINGS  The ventricles and sulci are normal for age. No intraparenchymal hemorrhage, mass effect nor midline shift.  Patchy supratentorial white matter hypodensities are less than expected for patient's age and though non-specific suggest sequelae of chronic small vessel ischemic disease. No acute large vascular territory infarcts.  No abnormal extra-axial fluid collections. 11 mm left parafalcine meningioma again seen without mass effect. Basal cisterns are patent. Moderate calcific atherosclerosis of the carotid siphons.  No skull fracture. Trace paranasal sinus mucosal thickening without air-fluid levels. The mastoid air cells are well aerated. . The included ocular globes and orbital contents are non-suspicious. Status post right ocular lens implant.  CT CERVICAL SPINE FINDINGS  Cervical vertebral bodies and posterior elements are intact. Grade 1 C2-3 anterolisthesis, grade 1 C7-T1 anterolisthesis on degenerative basis. Severe C3-4 thru C6-7 degenerative disc disease. C1-2 articulation maintained with severe arthropathy and possible small os auto none 4 8 a.m. Mild broad dextroscoliosis. No destructive bony lesions.  Asymmetric fullness of the right pharyngeal soft tissues, with coarse calcification, axial 41/80. This could reflect very tortuous vascular structures or mass contiguous with the right sternocleidomastoid muscle posteriorly. This is stable from June 03, 2013  Degenerative disc disease and facet arthropathy results in severe canal stenosis at C5-6, mild-to-moderate C4-5. Severe neural foraminal narrowing C3-4 thru C6-7.  IMPRESSION: CT head:  No acute intracranial process.  Stable left parafalcine 11 mm meningioma without mass effect. Involutional changes. Mild white matter changes suggest chronic small vessel ischemic disease.  CT cervical spine: No acute cervical spine fracture. Grade 1 C2-3 and grade 1 C7-T1 anterolisthesis with on degenerative basis.  Asymmetric fullness of the right pharyngeal soft tissues, which could reflect prominent vascular structure, less likely mass in would be better characterized on  CT or MRI of the neck with contrast on a nonemergent basis, as clinically indicated.   Electronically Signed   By: Elon Alas   On: 08/26/2013 03:22   Ct Cervical Spine Wo Contrast  08/26/2013   CLINICAL DATA:  Unwitnessed fall, dizziness.  EXAM: CT HEAD WITHOUT CONTRAST  CT CERVICAL SPINE WITHOUT CONTRAST  TECHNIQUE: Multidetector CT imaging of the head and cervical spine was performed following the standard protocol without intravenous contrast. Multiplanar CT image reconstructions of the cervical spine were also generated.  COMPARISON:  CT HEAD W/O CM dated 06/03/2013; CT MAXILLOFACIAL W/O CM dated 06/03/2013; DG FLUORO GUIDE NDL PLC/BX dated 05/20/2012; CT HEAD W/O CM dated 12/20/2011  FINDINGS: CT HEAD FINDINGS  The ventricles and sulci are normal for age. No intraparenchymal hemorrhage, mass effect nor midline shift. Patchy supratentorial white matter hypodensities are less than expected for patient's age and though non-specific suggest sequelae of chronic small vessel ischemic disease. No acute large vascular territory infarcts.  No abnormal extra-axial fluid collections. 11 mm left parafalcine meningioma again seen without mass effect. Basal cisterns are patent. Moderate calcific atherosclerosis of the  carotid siphons.  No skull fracture. Trace paranasal sinus mucosal thickening without air-fluid levels. The mastoid air cells are well aerated. . The included ocular globes and orbital contents are non-suspicious. Status post right ocular lens implant.  CT CERVICAL SPINE FINDINGS  Cervical vertebral bodies and posterior elements are intact. Grade 1 C2-3 anterolisthesis, grade 1 C7-T1 anterolisthesis on degenerative basis. Severe C3-4 thru C6-7 degenerative disc disease. C1-2 articulation maintained with severe arthropathy and possible small os auto none 4 8 a.m. Mild broad dextroscoliosis. No destructive bony lesions.  Asymmetric fullness of the right pharyngeal soft tissues, with coarse calcification,  axial 41/80. This could reflect very tortuous vascular structures or mass contiguous with the right sternocleidomastoid muscle posteriorly. This is stable from June 03, 2013  Degenerative disc disease and facet arthropathy results in severe canal stenosis at C5-6, mild-to-moderate C4-5. Severe neural foraminal narrowing C3-4 thru C6-7.  IMPRESSION: CT head:  No acute intracranial process.  Stable left parafalcine 11 mm meningioma without mass effect. Involutional changes. Mild white matter changes suggest chronic small vessel ischemic disease.  CT cervical spine: No acute cervical spine fracture. Grade 1 C2-3 and grade 1 C7-T1 anterolisthesis with on degenerative basis.  Asymmetric fullness of the right pharyngeal soft tissues, which could reflect prominent vascular structure, less likely mass in would be better characterized on CT or MRI of the neck with contrast on a nonemergent basis, as clinically indicated.   Electronically Signed   By: Elon Alas   On: 08/26/2013 03:22   Ct Hip Right Wo Contrast  08/26/2013   CLINICAL DATA:  Pain without trauma  EXAM: CT OF THE RIGHT HIP WITHOUT CONTRAST  TECHNIQUE: Multidetector CT imaging was performed according to the standard protocol. Multiplanar CT image reconstructions were also generated.  COMPARISON:  Radiographs from earlier the same day, and earlier studies  FINDINGS: Minimally displaced fracture of the superior right pubic ramus. No pelvic hematoma. Proximal femur intact. Advanced right hip osteoarthritis with bone on bone apposition superiorly. Subchondral cysts/ geodes in the superior acetabulum and femoral head. Subchondral sclerosis in the superior acetabulum and femoral head. There is some mild remodeling of the femoral head. Marginal spurs from the acetabulum and femoral head.  Patchy iliofemoral arterial calcifications. Anterior abdominal wall suture material.  IMPRESSION: 1. Minimally displaced fracture, superior right pubic ramus. 2. Advanced  right hip osteoarthritis.   Electronically Signed   By: Arne Cleveland M.D.   On: 08/26/2013 09:27    Assessment/Plan 1 preoperative evaluation-if patient requires hip replacement she would be at increased risk given her overall age. If she is accepting of that risk she may proceed without further cardiac testing. There is no contraindication from a cardiac standpoint as her recent nuclear study was low risk, echocardiogram showed moderate aortic stenosis and she is not having symptoms. 2 syncope-symptoms sound to be orthostatic mediated. She should increase by mouth fluid intake. Telemetry reviewed and no significant arrhythmias noted to date. 3 aortic stenosis 4 mildly abnormal nuclear study-would add past for an 81 mg daily. No chest pain.  5 hypertension-continue present dose of Cozaar. Would not be aggressive with blood pressure control given recent orthostatic syncope and age. 6 asymmetric fullness of right pharyngeal soft tissues- Further evaluation per primary care. We will sign off. Please call with questions. Kirk Ruths MD 08/26/2013, 10:51 AM

## 2013-08-26 NOTE — Progress Notes (Signed)
PT Cancellation Note  Patient Details Name: Jaqulyn Chancellor MRN: [id number] DOB: 05/02/1919   Cancelled Treatment:    Reason Eval/Treat Not Completed: Patient not medically ready  Have received orders for PT.  Will await results of CT and of ortho consult before proceeding with evaluation.  "Sticky note" left for ortho MD to clarify if/when PT should proceed.    Gerlean Ren PT [facility] [phone] [place] [phone]    Ladona Ridgel 08/26/2013, 10:49 AM

## 2013-08-26 NOTE — ED Provider Notes (Signed)
CSN: [id number]     Arrival date & time 08/26/13  0059 History   First MD Initiated Contact with Patient 08/26/13 0105     Chief Complaint  Patient presents with  . Fall  . Diarrhea     (Consider location/radiation/quality/duration/timing/severity/associated sxs/prior Treatment) HPI Comments: 80 yo female sent to the emergency department with chief complaint of dizziness and fall. Patient states she got up from her bed she denies headache, neck pain, chest pain, abdominal pain, extremity pain. Upon falling she called her daughter who called EMS. EMS transport unremarkable.  Patient has a history of hypertension, restless leg syndrome, ulcer, arthritis, bladder stones, urinary incontinence, bowel incontinence, heart murmur, stroke in the past.  Upon arrival patient is alert and oriented, pleasant, and without complaint. She did have an episode of bowel incontinence during transport. She denies paralysis, paresthesias, headache, slurred speech, new neurologic symptoms.   Patient is a 80 y.o. female presenting with fall and diarrhea. The history is provided by the patient and the EMS personnel.  Fall This is a new problem. The current episode started 1 to 2 hours ago. The problem has been resolved. Pertinent negatives include no chest pain, no abdominal pain, no headaches and no shortness of breath. Nothing aggravates the symptoms. Nothing relieves the symptoms. She has tried nothing for the symptoms.  Diarrhea Associated symptoms: no abdominal pain, no chills, no headaches and no vomiting     Past Medical History  Diagnosis Date  . Hypertension   . Restless legs syndrome     on Requip  . Ulcer causing bleeding and hole in wall of stomach or small intestine   . Gastric ulcer     years ago  . Arthritis     Back, knees  . Constipation   . Urinary bladder calculus   . Urinary incontinence   . Bowel incontinence   . Aortic stenosis   . Stroke     Mini, no residual   Past Surgical  History  Procedure Laterality Date  . Colon resection  50 years ago  . Tonsillectomy    . Uterine fibroid surgery    . Lumbar laminectomy/decompression microdiscectomy  06/25/2011    Procedure: LUMBAR LAMINECTOMY/DECOMPRESSION MICRODISCECTOMY;  Surgeon: Hosie Spangle, MD;  Location: [facility];  Service: Neurosurgery;  Laterality: Right;  RIGHT Lumbar Two-Three hemilaminectomy and microdiskectomy  . Vaginal hysterectomy     Family History  Problem Relation Age of Onset  . Anesthesia problems Neg Hx   . Pancreatic cancer Brother   . Dementia Mother   . Stroke Father    History  Substance Use Topics  . Smoking status: Never Smoker   . Smokeless tobacco: Never Used  . Alcohol Use: Yes     Comment: rarely   OB History   Grav Para Term Preterm Abortions TAB SAB Ect Mult Living                 Review of Systems  Constitutional: Negative for chills, activity change and appetite change.  HENT: Negative.   Eyes: Negative.   Respiratory: Negative for cough, choking, chest tightness, shortness of breath, wheezing and stridor.   Cardiovascular: Negative.  Negative for chest pain, palpitations and leg swelling.  Gastrointestinal: Positive for diarrhea. Negative for nausea, vomiting, abdominal pain, constipation, blood in stool, abdominal distention, anal bleeding and rectal pain.  Endocrine: Negative.   Genitourinary: Negative for urgency, frequency, hematuria, flank pain, decreased urine volume, vaginal bleeding, genital sores, menstrual problem and pelvic  pain.        H/o bowel incontinence and urinary incontinence  Neurological: Positive for dizziness. Negative for tremors, seizures, syncope, facial asymmetry, speech difficulty, weakness, light-headedness, numbness and headaches.      Allergies  Amoxicillin; Baclofen; Hctz; and Valium  Home Medications   No current outpatient prescriptions on file. BP 180/70  Pulse 84  Temp(Src) 97.9 F (36.6 C) (Oral)  Resp 16  Ht 5'  (1.524 m)  Wt 109 lb (49.442 kg)  BMI 21.29 kg/m2  SpO2 100% Physical Exam  Nursing note and vitals reviewed. Constitutional: She is oriented to person, place, and time. Vital signs are normal. She appears well-developed and well-nourished. She appears listless. No distress.  Pleasant in nad, normal speech  HENT:  Head: Normocephalic and atraumatic.  Right Ear: External ear normal.  Left Ear: External ear normal.  Mouth/Throat: Oropharynx is clear and moist.  Eyes: Conjunctivae are normal. Right eye exhibits no discharge. Left eye exhibits no discharge.  Neck: Normal range of motion. Neck supple. No JVD present. No tracheal deviation present.  Cardiovascular: Normal rate and regular rhythm.   No murmur heard. Pulmonary/Chest: Effort normal and breath sounds normal. No stridor. No respiratory distress. She has no wheezes. She has no rales.  Abdominal: Soft. Bowel sounds are normal. She exhibits no distension and no mass. There is no tenderness. There is no rebound and no guarding.  Musculoskeletal: She exhibits tenderness. She exhibits no edema.       Right hip: She exhibits decreased range of motion and tenderness. She exhibits normal strength, no bony tenderness, no swelling, no crepitus and no deformity.  Distal n/v/m functions intact  Neurological: She is oriented to person, place, and time. She has normal reflexes. She appears listless.  Skin: Skin is dry. She is not diaphoretic.    ED Course  Procedures (including critical care time) Labs Review Labs Reviewed  CBC - Abnormal; Notable for the following:    RBC 3.79 (*)    Hemoglobin 11.3 (*)    HCT 33.9 (*)    All other components within normal limits  URINALYSIS, ROUTINE W REFLEX MICROSCOPIC - Abnormal; Notable for the following:    APPearance CLOUDY (*)    Leukocytes, UA MODERATE (*)    All other components within normal limits  COMPREHENSIVE METABOLIC PANEL - Abnormal; Notable for the following:    Glucose, Bld 105 (*)     BUN 32 (*)    Albumin 3.4 (*)    GFR calc non Af Amer 62 (*)    GFR calc Af Amer 71 (*)    All other components within normal limits  URINE MICROSCOPIC-ADD ON - Abnormal; Notable for the following:    Squamous Epithelial / LPF FEW (*)    Bacteria, UA MANY (*)    All other components within normal limits  URINE CULTURE  TROPONIN I  TROPONIN I  TROPONIN I  TROPONIN I  POC OCCULT BLOOD, ED   Imaging Review Dg Chest 1 View  08/26/2013   CLINICAL DATA:  Fall, right hip pain  EXAM: CHEST - 1 VIEW  COMPARISON:  Prior chest x-ray 06/29/2013  FINDINGS: Stable cardiomegaly with left heart enlargement. Mediastinal contours are unchanged. Low inspiratory volumes. No pleural effusion, pneumothorax or focal airspace consolidation. No suspicious pulmonary mass or nodule. No acute osseous abnormality. Mild degenerative changes in the right shoulder.  IMPRESSION: No active disease.   Electronically Signed   By: Jacqulynn Cadet M.D.   On: 08/26/2013 02:31  Dg Pelvis 1-2 Views  08/26/2013   CLINICAL DATA:  Fall, right hip pain  EXAM: PELVIS - 1-2 VIEW  COMPARISON:  Prior abdominal radiographs 09/30/2012  FINDINGS: The bones are diffusely osteopenic. Numerous surgical ligatures project over the lower abdomen and pelvis. Linear artifact projects over the pelvis secondary to multiple cardiac leads. No acute fracture or malalignment identified. Compared to May of 2014 there has been significant interval progression of right hip osteoarthritis with increased loss of the superolateral joint space and developing subchondral cyst in the acetabulum. Additionally, the femoral head is sclerotic and demonstrates irregularity of the articular surface. Overall findings are concerning for progressive avascular necrosis and secondary osteoarthritis. Lower lumbar degenerative disc disease and facet arthropathy.  IMPRESSION: 1. No acute fracture identified. Marked osteopenia limits evaluation for nondisplaced fractures. 2.  Findings suggest developing right femoral head avascular necrosis and progressive secondary right hip osteoarthritis compared to 09/30/2012.   Electronically Signed   By: Jacqulynn Cadet M.D.   On: 08/26/2013 02:30   Ct Head Wo Contrast  08/26/2013   CLINICAL DATA:  Unwitnessed fall, dizziness.  EXAM: CT HEAD WITHOUT CONTRAST  CT CERVICAL SPINE WITHOUT CONTRAST  TECHNIQUE: Multidetector CT imaging of the head and cervical spine was performed following the standard protocol without intravenous contrast. Multiplanar CT image reconstructions of the cervical spine were also generated.  COMPARISON:  CT HEAD W/O CM dated 06/03/2013; CT MAXILLOFACIAL W/O CM dated 06/03/2013; DG FLUORO GUIDE NDL PLC/BX dated 05/20/2012; CT HEAD W/O CM dated 12/20/2011  FINDINGS: CT HEAD FINDINGS  The ventricles and sulci are normal for age. No intraparenchymal hemorrhage, mass effect nor midline shift. Patchy supratentorial white matter hypodensities are less than expected for patient's age and though non-specific suggest sequelae of chronic small vessel ischemic disease. No acute large vascular territory infarcts.  No abnormal extra-axial fluid collections. 11 mm left parafalcine meningioma again seen without mass effect. Basal cisterns are patent. Moderate calcific atherosclerosis of the carotid siphons.  No skull fracture. Trace paranasal sinus mucosal thickening without air-fluid levels. The mastoid air cells are well aerated. . The included ocular globes and orbital contents are non-suspicious. Status post right ocular lens implant.  CT CERVICAL SPINE FINDINGS  Cervical vertebral bodies and posterior elements are intact. Grade 1 C2-3 anterolisthesis, grade 1 C7-T1 anterolisthesis on degenerative basis. Severe C3-4 thru C6-7 degenerative disc disease. C1-2 articulation maintained with severe arthropathy and possible small os auto none 4 8 a.m. Mild broad dextroscoliosis. No destructive bony lesions.  Asymmetric fullness of the right  pharyngeal soft tissues, with coarse calcification, axial 41/80. This could reflect very tortuous vascular structures or mass contiguous with the right sternocleidomastoid muscle posteriorly. This is stable from June 03, 2013  Degenerative disc disease and facet arthropathy results in severe canal stenosis at C5-6, mild-to-moderate C4-5. Severe neural foraminal narrowing C3-4 thru C6-7.  IMPRESSION: CT head:  No acute intracranial process.  Stable left parafalcine 11 mm meningioma without mass effect. Involutional changes. Mild white matter changes suggest chronic small vessel ischemic disease.  CT cervical spine: No acute cervical spine fracture. Grade 1 C2-3 and grade 1 C7-T1 anterolisthesis with on degenerative basis.  Asymmetric fullness of the right pharyngeal soft tissues, which could reflect prominent vascular structure, less likely mass in would be better characterized on CT or MRI of the neck with contrast on a nonemergent basis, as clinically indicated.   Electronically Signed   By: Elon Alas   On: 08/26/2013 03:22   Ct Cervical Spine Wo  Contrast  08/26/2013   CLINICAL DATA:  Unwitnessed fall, dizziness.  EXAM: CT HEAD WITHOUT CONTRAST  CT CERVICAL SPINE WITHOUT CONTRAST  TECHNIQUE: Multidetector CT imaging of the head and cervical spine was performed following the standard protocol without intravenous contrast. Multiplanar CT image reconstructions of the cervical spine were also generated.  COMPARISON:  CT HEAD W/O CM dated 06/03/2013; CT MAXILLOFACIAL W/O CM dated 06/03/2013; DG FLUORO GUIDE NDL PLC/BX dated 05/20/2012; CT HEAD W/O CM dated 12/20/2011  FINDINGS: CT HEAD FINDINGS  The ventricles and sulci are normal for age. No intraparenchymal hemorrhage, mass effect nor midline shift. Patchy supratentorial white matter hypodensities are less than expected for patient's age and though non-specific suggest sequelae of chronic small vessel ischemic disease. No acute large vascular territory  infarcts.  No abnormal extra-axial fluid collections. 11 mm left parafalcine meningioma again seen without mass effect. Basal cisterns are patent. Moderate calcific atherosclerosis of the carotid siphons.  No skull fracture. Trace paranasal sinus mucosal thickening without air-fluid levels. The mastoid air cells are well aerated. . The included ocular globes and orbital contents are non-suspicious. Status post right ocular lens implant.  CT CERVICAL SPINE FINDINGS  Cervical vertebral bodies and posterior elements are intact. Grade 1 C2-3 anterolisthesis, grade 1 C7-T1 anterolisthesis on degenerative basis. Severe C3-4 thru C6-7 degenerative disc disease. C1-2 articulation maintained with severe arthropathy and possible small os auto none 4 8 a.m. Mild broad dextroscoliosis. No destructive bony lesions.  Asymmetric fullness of the right pharyngeal soft tissues, with coarse calcification, axial 41/80. This could reflect very tortuous vascular structures or mass contiguous with the right sternocleidomastoid muscle posteriorly. This is stable from June 03, 2013  Degenerative disc disease and facet arthropathy results in severe canal stenosis at C5-6, mild-to-moderate C4-5. Severe neural foraminal narrowing C3-4 thru C6-7.  IMPRESSION: CT head:  No acute intracranial process.  Stable left parafalcine 11 mm meningioma without mass effect. Involutional changes. Mild white matter changes suggest chronic small vessel ischemic disease.  CT cervical spine: No acute cervical spine fracture. Grade 1 C2-3 and grade 1 C7-T1 anterolisthesis with on degenerative basis.  Asymmetric fullness of the right pharyngeal soft tissues, which could reflect prominent vascular structure, less likely mass in would be better characterized on CT or MRI of the neck with contrast on a nonemergent basis, as clinically indicated.   Electronically Signed   By: Elon Alas   On: 08/26/2013 03:22   Ct Hip Right Wo Contrast  08/26/2013    CLINICAL DATA:  Pain without trauma  EXAM: CT OF THE RIGHT HIP WITHOUT CONTRAST  TECHNIQUE: Multidetector CT imaging was performed according to the standard protocol. Multiplanar CT image reconstructions were also generated.  COMPARISON:  Radiographs from earlier the same day, and earlier studies  FINDINGS: Minimally displaced fracture of the superior right pubic ramus. No pelvic hematoma. Proximal femur intact. Advanced right hip osteoarthritis with bone on bone apposition superiorly. Subchondral cysts/ geodes in the superior acetabulum and femoral head. Subchondral sclerosis in the superior acetabulum and femoral head. There is some mild remodeling of the femoral head. Marginal spurs from the acetabulum and femoral head.  Patchy iliofemoral arterial calcifications. Anterior abdominal wall suture material.  IMPRESSION: 1. Minimally displaced fracture, superior right pubic ramus. 2. Advanced right hip osteoarthritis.   Electronically Signed   By: Arne Cleveland M.D.   On: 08/26/2013 09:27     EKG Interpretation   Date/Time:  Friday August 26 2013 01:26:32 EDT Ventricular Rate:  65 PR Interval:  175 QRS Duration: 88 QT Interval:  403 QTC Calculation: 419 R Axis:   -15 Text Interpretation:  Sinus rhythm Borderline left axis deviation  Confirmed by Aneyah Lortz  MD, Michiko Lineman ([id number]) on 08/26/2013 1:02:54 PM      MDM   Final diagnoses:  Fall  UTI (lower urinary tract infection)   Saadiya Wilfong is a 80 yo female with h/o HTN, Restless leg syndrome, Ulcer, arthritis, constipation, bowel and bladder incontinence, AS, stroke who presents to ER s/p fall.  She lives with her daughter.    ER course: CT head and cspine neg for acute pathology CXR and pelvis XR neg for acute pathology; pt does have AVN or R femoral head UA c/w UTI CBC, CMP neg Trop neg and EKG w/o sns of ischema  Medicine consulted for admission for fall, UTI, and to trend troponins (pt w/o CP during entire ER stay)  Plan for  admission.  Case d/w Dr Posey Pronto.  Pt updated.  No issues during ER stay.    CT R hip obtained by admitting service which revealed minimally displaced superior R pubic ramus.  Inpt team to address.      Elmer Sow, MD 08/26/13 [phone]

## 2013-08-26 NOTE — ED Notes (Signed)
Per EMS, pt coming from home. Pt had an unwitnessed fall after getting out of bed and getting dizzy. Pt denies LOC and denies hitting her head. Pt denies taking blood thinners. Pt denies CP, SOB. BP:164/78 HR 86, SpO2: 98%, CBG:135

## 2013-08-26 NOTE — Progress Notes (Signed)
UR completed 

## 2013-08-27 LAB — URINE CULTURE
Colony Count: NO GROWTH
Culture: NO GROWTH

## 2013-08-27 LAB — CBC
HCT: 30.7 % — ABNORMAL LOW (ref 36.0–46.0)
HEMOGLOBIN: 10.3 g/dL — AB (ref 12.0–15.0)
MCH: 30 pg (ref 26.0–34.0)
MCHC: 33.6 g/dL (ref 30.0–36.0)
MCV: 89.5 fL (ref 78.0–100.0)
Platelets: 207 10*3/uL (ref 150–400)
RBC: 3.43 MIL/uL — ABNORMAL LOW (ref 3.87–5.11)
RDW: 13.4 % (ref 11.5–15.5)
WBC: 8.5 10*3/uL (ref 4.0–10.5)

## 2013-08-27 LAB — BASIC METABOLIC PANEL
BUN: 19 mg/dL (ref 6–23)
CALCIUM: 7.9 mg/dL — AB (ref 8.4–10.5)
CO2: 25 mEq/L (ref 19–32)
CREATININE: 0.8 mg/dL (ref 0.50–1.10)
Chloride: 102 mEq/L (ref 96–112)
GFR calc Af Amer: 71 mL/min — ABNORMAL LOW (ref >90)
GFR, EST NON AFRICAN AMERICAN: 62 mL/min — AB (ref >90)
GLUCOSE: 104 mg/dL — AB (ref 70–99)
Potassium: 3.7 mEq/L (ref 3.7–5.3)
SODIUM: 140 meq/L (ref 137–147)

## 2013-08-27 MED ORDER — AMLODIPINE BESYLATE 10 MG PO TABS: 10.0000 mg | ORAL_TABLET | Freq: Every day | ORAL | Status: DC

## 2013-08-27 MED ORDER — CARVEDILOL 3.125 MG PO TABS
3.1250 mg | ORAL_TABLET | Freq: Two times a day (BID) | ORAL | Status: DC
  Administered 2013-08-27 – 2013-08-29 (×4): 3.125 mg via ORAL
  Filled 2013-08-27 (×6): qty 1

## 2013-08-27 MED ORDER — ASPIRIN 81 MG PO CHEW: 81.0000 mg | CHEWABLE_TABLET | Freq: Every day | ORAL | Status: DC

## 2013-08-27 MED ORDER — HYDROMORPHONE HCL 4 MG PO TABS: 4.0000 mg | ORAL_TABLET | Freq: Four times a day (QID) | ORAL | Status: DC | PRN

## 2013-08-27 MED ORDER — ENSURE COMPLETE PO LIQD: 237.0000 mL | Freq: Two times a day (BID) | ORAL | Status: DC

## 2013-08-27 MED ORDER — CIPROFLOXACIN HCL 250 MG PO TABS: 250.0000 mg | ORAL_TABLET | Freq: Two times a day (BID) | ORAL | Status: DC

## 2013-08-27 NOTE — Progress Notes (Signed)
Physical Therapy Treatment Patient Details Name: Shelly Martin MRN: [id number] DOB: 12-May-1919 Today's Date: 08/27/2013    History of Present Illness Pt. admitted following a fall (syncopal episode) resulting in R superior pubic ramus fx, non displaced.  Pt. also with end stage OA of R hip and had THA scheduled tentatively for August.      PT Comments    Pt progressing towards physical therapy goals. Pt reports she will not have 24 hour assist at home, and feel pt continues to require assistance for transfers and ambulation for safety. At increased risk of falls if pt returns home alone. PT recommending short term rehab at a SNF level prior to return home, to increase safety and independence with functional mobility and to decrease risk of falls.   Follow Up Recommendations  SNF     Equipment Recommendations  None recommended by PT;Other (comment)    Recommendations for Other Services       Precautions / Restrictions Precautions Precautions: Fall Restrictions Weight Bearing Restrictions: Yes RLE Weight Bearing: Weight bearing as tolerated    Mobility  Bed Mobility Overal bed mobility: Needs Assistance Bed Mobility: Supine to Sit     Supine to sit: Min assist;HOB elevated (for RLE off bed)     General bed mobility comments: Pt received sitting up in chair  Transfers Overall transfer level: Needs assistance Equipment used: Rolling walker (2 wheeled) Transfers: Sit to/from Stand Sit to Stand: Min assist Stand pivot transfers: +2 safety/equipment;Min assist       General transfer comment: VC's for hand placement and technique. Min assist to power up to stand. Pt with good control on descent to sit.  Ambulation/Gait Ambulation/Gait assistance: Min guard Ambulation Distance (Feet): 45 Feet Assistive device: Rolling walker (2 wheeled) Gait Pattern/deviations: Step-to pattern;Step-through pattern;Decreased stride length;Trunk flexed Gait velocity: Decreased Gait  velocity interpretation: Below normal speed for age/gender General Gait Details: VC's for walker placement closer to pt's body. Pt moving slowly due to pain and very cautious about "overdoing it". Took 1 seated rest break, and will require a chair follow for safety.    Stairs            Wheelchair Mobility    Modified Rankin (Stroke Patients Only)       Balance Overall balance assessment: History of Falls                                  Cognition Arousal/Alertness: Awake/alert Behavior During Therapy: WFL for tasks assessed/performed Overall Cognitive Status: Within Functional Limits for tasks assessed                      Exercises General Exercises - Lower Extremity Ankle Circles/Pumps: 10 reps    General Comments        Pertinent Vitals/Pain Pt reports increased pain with ambulation, and asks for seated rest break. Does not want to "over do" it. Pt declines call to RN regarding pain medication.     Home Living Family/patient expects to be discharged to:: Skilled nursing facility                    Prior Function Level of Independence: Independent with assistive device(s) (RW)          PT Goals (current goals can now be found in the care plan section) Acute Rehab PT Goals Patient Stated Goal: home for recovery and hopeful THA later  in the year PT Goal Formulation: With patient Time For Goal Achievement: 09/02/13 Potential to Achieve Goals: Good Progress towards PT goals: Progressing toward goals    Frequency  Min 6X/week    PT Plan Discharge plan needs to be updated    Co-evaluation             End of Session Equipment Utilized During Treatment: Gait belt Activity Tolerance: Patient tolerated treatment well Patient left: in chair;with call bell/phone within reach     Time: 3545-6256 PT Time Calculation (min): 21 min  Charges:  $Gait Training: 8-22 mins                    G Codes:      Jolyn Lent 09-18-13, 3:51 PM  Jolyn Lent, PT, DPT Acute Rehabilitation Services Pager: [phone]

## 2013-08-27 NOTE — Progress Notes (Signed)
Clinical Social Work Department BRIEF PSYCHOSOCIAL ASSESSMENT 08/27/2013  Patient:  Shelly Martin, Shelly Martin     Account Number:  [id number]     Admit date:  08/26/2013  Clinical Social Worker:  Rolinda Roan  Date/Time:  08/27/2013 06:24 PM  Referred by:  RN  Date Referred:  08/27/2013 Referred for  SNF Placement   Other Referral:   Interview type:  Patient Other interview type:    PSYCHOSOCIAL DATA Living Status:  WITH ADULT CHILDREN Admitted from facility:   Level of care:   Primary support name:  [name] Primary support relationship to patient:  CHILD, ADULT Degree of support available:   Per patient she lives with her daughter Jan and she is very.    CURRENT CONCERNS  Other Concerns:    SOCIAL WORK ASSESSMENT / PLAN Clinical Social Worker (CSW) met with patient who reported that she is agreeable to SNF placement. Patient reported that she is living in [place] with her daughter Jan and is new to the area and is not familiar with the skilled nursing facilities. Patient reported that she is having hip surgery in August this year. CSW encouraged patient to look over SNF list and look on medicare.gov to view the ratings of the facilities. Patient verbalized her understanding.   Assessment/plan status:  Psychosocial Support/Ongoing Assessment of Needs Other assessment/ plan:   Information/referral to community resources:   CSW gave patient SNF list.    PATIENT'S/FAMILY'S RESPONSE TO PLAN OF CARE: Patient thanked CSW for starting placement process.

## 2013-08-27 NOTE — Progress Notes (Signed)
Occupational Therapy Evaluation and Discharge Patient Details Name: Shelly Martin MRN: [id number] DOB: October 26, 1918 Today's Date: 08/27/2013    History of Present Illness Pt. admitted following a fall (syncopal episode) resulting in R superior pubic ramus fx, non displaced.  Pt. also with end stage OA of R hip and had THA scheduled tentatively for August.     Clinical Impression   PTA pt lived at home with her daughter and was Independent with assistive device (RW) for ADLs and mobility. Education and training provided regarding compensatory techniques for LB ADLs and WB precautions. Pt transferred from bed to recliner and completed grooming activities. Pt reports that her daughter works and she does not have 24/7 assistance at home for ADLs and mobility. Due to safety concerns, feel that pt would benefit from skilled OT at SNF to increase strength and endurance during ADLs. All further OT needs can be met at next venue of care (SNF).     Follow Up Recommendations  Supervision/Assistance - 24 hour;SNF    Equipment Recommendations  Other (comment) (TBD by next venue of care (SNF))       Precautions / Restrictions Precautions Precautions: Fall Restrictions Weight Bearing Restrictions: Yes RLE Weight Bearing: Weight bearing as tolerated      Mobility Bed Mobility Overal bed mobility: Needs Assistance Bed Mobility: Supine to Sit     Supine to sit: Min assist;HOB elevated (for RLE off bed)     General bed mobility comments: min assist  and verbal cues for technique  Transfers Overall transfer level: Needs assistance Equipment used: Rolling walker (2 wheeled) Transfers: Sit to/from [facility] Sit to Stand: +2 safety/equipment;Min assist Stand pivot transfers: +2 safety/equipment;Min assist       General transfer comment: VC's for hand placement and technique. Min assist +1 to power up to stand. Pt with good control on descent to sit.         ADL  Overall ADL's : Needs assistance/impaired Eating/Feeding: Independent;Sitting   Grooming: Set up;Sitting   Upper Body Bathing: Set up;Sitting   Lower Body Bathing: Moderate assistance;Sit to/from stand   Upper Body Dressing : Set up;Sitting   Lower Body Dressing: Maximal assistance;Sit to/from stand   Toilet Transfer: Minimal assistance;+2 for safety/equipment;Ambulation;RW;Stand-pivot (sit<>stand from bed to recliner)   Toileting- Clothing Manipulation and Hygiene: Minimal assistance;Sit to/from stand       Functional mobility during ADLs: Min guard;Rolling walker;+2 for safety/equipment                 Pertinent Vitals/Pain No c/o pain; VSS     Hand Dominance Right   Extremity/Trunk Assessment Upper Extremity Assessment Upper Extremity Assessment: Overall WFL for tasks assessed   Lower Extremity Assessment Lower Extremity Assessment: Defer to PT evaluation   Cervical / Trunk Assessment Cervical / Trunk Assessment: Normal   Communication Communication Communication: No difficulties   Cognition Arousal/Alertness: Awake/alert Behavior During Therapy: WFL for tasks assessed/performed Overall Cognitive Status: Within Functional Limits for tasks assessed                                Home Living Family/patient expects to be discharged to:: Skilled nursing facility                                        Prior Functioning/Environment Level of Independence: Independent with assistive device(s) (  RW)                                       End of Session Equipment Utilized During Treatment: Gait belt;Rolling walker  Activity Tolerance: Patient tolerated treatment well Patient left: in chair;with call bell/phone within reach;Other (comment) (awaiting PT)   Time: 4033-5331 OT Time Calculation (min): 33 min Charges:  OT General Charges $OT Visit: 1 Procedure OT Evaluation $Initial OT Evaluation Tier I: 1 Procedure OT  Treatments $Self Care/Home Management : 23-37 mins  Juluis Rainier [phone] 08/27/2013, 1:39 PM

## 2013-08-27 NOTE — Progress Notes (Signed)
Clinical Social Work Department CLINICAL SOCIAL WORK PLACEMENT NOTE 08/27/2013  Patient:  Shelly Martin, Shelly Martin  Account Number:  [id number] Admit date:  08/26/2013  Clinical Social Worker:  Tilden Fossa, Latanya Presser  Date/time:  08/27/2013 05:09 PM  Clinical Social Work is seeking post-discharge placement for this patient at the following level of care:   SKILLED NURSING   (*CSW will update this form in Epic as items are completed)   08/27/2013  Patient/family provided with [place] Department of Clinical Social Work's list of facilities offering this level of care within the geographic area requested by the patient (or if unable, by the patient's family).  08/27/2013  Patient/family informed of their freedom to choose among providers that offer the needed level of care, that participate in Medicare, Medicaid or managed care program needed by the patient, have an available bed and are willing to accept the patient.    Patient/family informed of [facility]' ownership interest in [facility], as well as of the fact that they are under no obligation to receive care at this facility.  PASARR submitted to EDS on 08/27/2013 PASARR number received from EDS on 08/27/2013  FL2 transmitted to all facilities in geographic area requested by pt/family on  08/27/2013 FL2 transmitted to all facilities within larger geographic area on   Patient informed that his/her managed care company has contracts with or will negotiate with  certain facilities, including the following:     Patient/family informed of bed offers received:   Patient chooses bed at  Physician recommends and patient chooses bed at    Patient to be transferred to  on   Patient to be transferred to facility by   The following physician request were entered in Epic:   Additional Comments:  Tilden Fossa, MSW, [place] Worker [facility] Emergency Dept. [phone]

## 2013-08-27 NOTE — Progress Notes (Signed)
Patient Demographics  Shelly Martin, is a 80 y.o. female, DOB - October 25, 1918, XHB:[id number]  Admit date - 08/26/2013   Admitting Physician Berle Mull, MD  Outpatient Primary MD for the patient is SCHOENHOFF,DEBBIE, MD  LOS - 1   Chief Complaint  Patient presents with  . Fall  . Diarrhea        Assessment & Plan    1. Dizziness - lightheadedness - most likely secondary to orthostasis caused by multiple episodes of diarrhea, she never lost consciousness, he was hydrated in the ER not orthostatic or dizzy anymore. Seen by cardiology. Outpatient followup by cardiology. Stable on telemetry..    2. Right hip avascular necrosis with acute on chronic right hip pain. CT scan done noted pelvic fracture which accounts for acute pain and discomfort, family unable to take care at home, insurance is approved placement, await bed. Seen by orthopedics to follow with Dr. Ricki Rodriguez in 2-3 weeks post discharge.     3.HTN - home dose Cozaar, have added low-dose Coreg for better control along with as needed IV hydralazine.    4. Moderate aortic stenosis, mild reversible ischemia on recent stress test, chronic grade 1 diastolic CHF with EF 81%. Currently compensated from heart standpoint, continue home dose Cozaar, added as needed hydralazine and Lopressor for better blood pressure control. Start aspirin by cardiology when seen the patient here.     5.UTI - continue cipro, follow cultures       6. ? Pharyngeal fullness - outpatient CT Neck, ENT follow up. Patient along with her daughter updated personally.        Code Status: Full  Family Communication: daughter over the phone on 08/26/2013 and 08/27/2013  Disposition Plan: TBD   Procedures CT Head and C spine non acute, CT Hip  ordered   Consults  Ortho, Cards     Medications  Scheduled Meds: . aspirin  81 mg Oral Daily  . ciprofloxacin  250 mg Oral BID  . enoxaparin (LOVENOX) injection  40 mg Subcutaneous Q24H  . feeding supplement (ENSURE COMPLETE)  237 mL Oral BID BM  . gabapentin  300 mg Oral BID WC   And  . gabapentin  600 mg Oral QHS  . losartan  50 mg Oral BID  . rOPINIRole  4 mg Oral QHS  . sodium chloride  3 mL Intravenous Q12H   Continuous Infusions:  PRN Meds:.acetaminophen, hydrALAZINE, HYDROmorphone, metoprolol, ondansetron (ZOFRAN) IV, oxyCODONE  DVT Prophylaxis  Heparin    Lab Results  Component Value Date   PLT 207 08/27/2013    Antibiotics    Anti-infectives   Start     Dose/Rate Route Frequency Ordered Stop   08/27/13 0000  ciprofloxacin (CIPRO) 250 MG tablet     250 mg Oral 2 times daily 08/27/13 1041     08/26/13 0800  ciprofloxacin (CIPRO) tablet 250 mg     250 mg Oral 2 times daily 08/26/13 0638     08/26/13 0445  levofloxacin (LEVAQUIN) IVPB 500 mg     500 mg 100 mL/hr over 60 Minutes Intravenous  Once 08/26/13 0437 08/26/13 0175          Subjective:   Deshante Cassell today has, No headache, No chest pain, No abdominal  pain - No Nausea, No new weakness tingling or numbness, No Cough - SOB.  + R Hip pain  Objective:   Filed Vitals:   08/26/13 2005 08/27/13 0520 08/27/13 0640 08/27/13 1015  BP: 141/61 158/69  153/65  Pulse: 76 66  66  Temp: 98.5 F (36.9 C) 98.3 F (36.8 C)    TempSrc: Oral Oral    Resp: 18 18    Height:      Weight:   50.168 kg (110 lb 9.6 oz)   SpO2: 97% 96%      Wt Readings from Last 3 Encounters:  08/27/13 50.168 kg (110 lb 9.6 oz)  08/22/13 49.896 kg (110 lb)  01/04/13 51.71 kg (114 lb)     Intake/Output Summary (Last 24 hours) at 08/27/13 1156 Last data filed at 08/27/13 1015  Gross per 24 hour  Intake   1563 ml  Output      8 ml  Net   1555 ml     Physical Exam  Awake Alert, Oriented X 3, No new F.N deficits,  Normal affect [place].AT,PERRAL Supple Neck,No JVD, No cervical lymphadenopathy appriciated.  Symmetrical Chest wall movement, Good air movement bilaterally, CTAB RRR,No Gallops,Rubs , positive 4/5 aortic systolic murmur, No Parasternal Heave +ve B.Sounds, Abd Soft, Non tender, No organomegaly appriciated, No rebound - guarding or rigidity. No Cyanosis, Clubbing or edema, No new Rash or bruise      Data Review   Micro Results No results found for this or any previous visit (from the past 240 hour(s)).  Radiology Reports Dg Chest 1 View  08/26/2013   CLINICAL DATA:  Fall, right hip pain  EXAM: CHEST - 1 VIEW  COMPARISON:  Prior chest x-ray 06/29/2013  FINDINGS: Stable cardiomegaly with left heart enlargement. Mediastinal contours are unchanged. Low inspiratory volumes. No pleural effusion, pneumothorax or focal airspace consolidation. No suspicious pulmonary mass or nodule. No acute osseous abnormality. Mild degenerative changes in the right shoulder.  IMPRESSION: No active disease.   Electronically Signed   By: Jacqulynn Cadet M.D.   On: 08/26/2013 02:31   Dg Pelvis 1-2 Views  08/26/2013   CLINICAL DATA:  Fall, right hip pain  EXAM: PELVIS - 1-2 VIEW  COMPARISON:  Prior abdominal radiographs 09/30/2012  FINDINGS: The bones are diffusely osteopenic. Numerous surgical ligatures project over the lower abdomen and pelvis. Linear artifact projects over the pelvis secondary to multiple cardiac leads. No acute fracture or malalignment identified. Compared to May of 2014 there has been significant interval progression of right hip osteoarthritis with increased loss of the superolateral joint space and developing subchondral cyst in the acetabulum. Additionally, the femoral head is sclerotic and demonstrates irregularity of the articular surface. Overall findings are concerning for progressive avascular necrosis and secondary osteoarthritis. Lower lumbar degenerative disc disease and facet arthropathy.   IMPRESSION: 1. No acute fracture identified. Marked osteopenia limits evaluation for nondisplaced fractures. 2. Findings suggest developing right femoral head avascular necrosis and progressive secondary right hip osteoarthritis compared to 09/30/2012.   Electronically Signed   By: Jacqulynn Cadet M.D.   On: 08/26/2013 02:30   Ct Head Wo Contrast  08/26/2013   CLINICAL DATA:  Unwitnessed fall, dizziness.  EXAM: CT HEAD WITHOUT CONTRAST  CT CERVICAL SPINE WITHOUT CONTRAST  TECHNIQUE: Multidetector CT imaging of the head and cervical spine was performed following the standard protocol without intravenous contrast. Multiplanar CT image reconstructions of the cervical spine were also generated.  COMPARISON:  CT HEAD W/O CM dated 06/03/2013;  CT MAXILLOFACIAL W/O CM dated 06/03/2013; DG FLUORO GUIDE NDL PLC/BX dated 05/20/2012; CT HEAD W/O CM dated 12/20/2011  FINDINGS: CT HEAD FINDINGS  The ventricles and sulci are normal for age. No intraparenchymal hemorrhage, mass effect nor midline shift. Patchy supratentorial white matter hypodensities are less than expected for patient's age and though non-specific suggest sequelae of chronic small vessel ischemic disease. No acute large vascular territory infarcts.  No abnormal extra-axial fluid collections. 11 mm left parafalcine meningioma again seen without mass effect. Basal cisterns are patent. Moderate calcific atherosclerosis of the carotid siphons.  No skull fracture. Trace paranasal sinus mucosal thickening without air-fluid levels. The mastoid air cells are well aerated. . The included ocular globes and orbital contents are non-suspicious. Status post right ocular lens implant.  CT CERVICAL SPINE FINDINGS  Cervical vertebral bodies and posterior elements are intact. Grade 1 C2-3 anterolisthesis, grade 1 C7-T1 anterolisthesis on degenerative basis. Severe C3-4 thru C6-7 degenerative disc disease. C1-2 articulation maintained with severe arthropathy and possible small os  auto none 4 8 a.m. Mild broad dextroscoliosis. No destructive bony lesions.  Asymmetric fullness of the right pharyngeal soft tissues, with coarse calcification, axial 41/80. This could reflect very tortuous vascular structures or mass contiguous with the right sternocleidomastoid muscle posteriorly. This is stable from June 03, 2013  Degenerative disc disease and facet arthropathy results in severe canal stenosis at C5-6, mild-to-moderate C4-5. Severe neural foraminal narrowing C3-4 thru C6-7.  IMPRESSION: CT head:  No acute intracranial process.  Stable left parafalcine 11 mm meningioma without mass effect. Involutional changes. Mild white matter changes suggest chronic small vessel ischemic disease.  CT cervical spine: No acute cervical spine fracture. Grade 1 C2-3 and grade 1 C7-T1 anterolisthesis with on degenerative basis.  Asymmetric fullness of the right pharyngeal soft tissues, which could reflect prominent vascular structure, less likely mass in would be better characterized on CT or MRI of the neck with contrast on a nonemergent basis, as clinically indicated.   Electronically Signed   By: Elon Alas   On: 08/26/2013 03:22   Ct Cervical Spine Wo Contrast  08/26/2013   CLINICAL DATA:  Unwitnessed fall, dizziness.  EXAM: CT HEAD WITHOUT CONTRAST  CT CERVICAL SPINE WITHOUT CONTRAST  TECHNIQUE: Multidetector CT imaging of the head and cervical spine was performed following the standard protocol without intravenous contrast. Multiplanar CT image reconstructions of the cervical spine were also generated.  COMPARISON:  CT HEAD W/O CM dated 06/03/2013; CT MAXILLOFACIAL W/O CM dated 06/03/2013; DG FLUORO GUIDE NDL PLC/BX dated 05/20/2012; CT HEAD W/O CM dated 12/20/2011  FINDINGS: CT HEAD FINDINGS  The ventricles and sulci are normal for age. No intraparenchymal hemorrhage, mass effect nor midline shift. Patchy supratentorial white matter hypodensities are less than expected for patient's age and though  non-specific suggest sequelae of chronic small vessel ischemic disease. No acute large vascular territory infarcts.  No abnormal extra-axial fluid collections. 11 mm left parafalcine meningioma again seen without mass effect. Basal cisterns are patent. Moderate calcific atherosclerosis of the carotid siphons.  No skull fracture. Trace paranasal sinus mucosal thickening without air-fluid levels. The mastoid air cells are well aerated. . The included ocular globes and orbital contents are non-suspicious. Status post right ocular lens implant.  CT CERVICAL SPINE FINDINGS  Cervical vertebral bodies and posterior elements are intact. Grade 1 C2-3 anterolisthesis, grade 1 C7-T1 anterolisthesis on degenerative basis. Severe C3-4 thru C6-7 degenerative disc disease. C1-2 articulation maintained with severe arthropathy and possible small os auto none 4  8 a.m. Mild broad dextroscoliosis. No destructive bony lesions.  Asymmetric fullness of the right pharyngeal soft tissues, with coarse calcification, axial 41/80. This could reflect very tortuous vascular structures or mass contiguous with the right sternocleidomastoid muscle posteriorly. This is stable from June 03, 2013  Degenerative disc disease and facet arthropathy results in severe canal stenosis at C5-6, mild-to-moderate C4-5. Severe neural foraminal narrowing C3-4 thru C6-7.  IMPRESSION: CT head:  No acute intracranial process.  Stable left parafalcine 11 mm meningioma without mass effect. Involutional changes. Mild white matter changes suggest chronic small vessel ischemic disease.  CT cervical spine: No acute cervical spine fracture. Grade 1 C2-3 and grade 1 C7-T1 anterolisthesis with on degenerative basis.  Asymmetric fullness of the right pharyngeal soft tissues, which could reflect prominent vascular structure, less likely mass in would be better characterized on CT or MRI of the neck with contrast on a nonemergent basis, as clinically indicated.    Electronically Signed   By: Elon Alas   On: 08/26/2013 03:22    CBC  Recent Labs Lab 08/26/13 0137 08/27/13 0515  WBC 10.2 8.5  HGB 11.3* 10.3*  HCT 33.9* 30.7*  PLT 277 207  MCV 89.4 89.5  MCH 29.8 30.0  MCHC 33.3 33.6  RDW 13.7 13.4    Chemistries   Recent Labs Lab 08/26/13 0230 08/27/13 0515  NA 140 140  K 4.1 3.7  CL 102 102  CO2 26 25  GLUCOSE 105* 104*  BUN 32* 19  CREATININE 0.80 0.80  CALCIUM 8.8 7.9*  AST 18  --   ALT 10  --   ALKPHOS 70  --   BILITOT 0.6  --    ------------------------------------------------------------------------------------------------------------------ estimated creatinine clearance is 31.6 ml/min (by C-G formula based on Cr of 0.8). ------------------------------------------------------------------------------------------------------------------ No results found for this basename: HGBA1C,  in the last 72 hours ------------------------------------------------------------------------------------------------------------------ No results found for this basename: CHOL, HDL, LDLCALC, TRIG, CHOLHDL, LDLDIRECT,  in the last 72 hours ------------------------------------------------------------------------------------------------------------------ No results found for this basename: TSH, T4TOTAL, FREET3, T3FREE, THYROIDAB,  in the last 72 hours ------------------------------------------------------------------------------------------------------------------ No results found for this basename: VITAMINB12, FOLATE, FERRITIN, TIBC, IRON, RETICCTPCT,  in the last 72 hours  Coagulation profile No results found for this basename: INR, PROTIME,  in the last 168 hours  No results found for this basename: DDIMER,  in the last 72 hours  Cardiac Enzymes  Recent Labs Lab 08/26/13 0800 08/26/13 1505 08/26/13 2000  TROPONINI <0.30 <0.30 <0.30    ------------------------------------------------------------------------------------------------------------------ No components found with this basename: POCBNP,      Time Spent in minutes  35   Primitivo Merkey K M.D on 08/27/2013 at 11:56 AM  Between 7am to 7pm - Pager - [phone]  After 7pm go to [email or web address] - password [email or web address]  And look for the night coverage person covering for me after hours  [facility] Office  [phone]

## 2013-08-28 DIAGNOSIS — I10 Essential (primary) hypertension: Secondary | ICD-10-CM

## 2013-08-28 DIAGNOSIS — M549 Dorsalgia, unspecified: Secondary | ICD-10-CM

## 2013-08-28 DIAGNOSIS — G8929 Other chronic pain: Secondary | ICD-10-CM

## 2013-08-28 DIAGNOSIS — S329XXA Fracture of unspecified parts of lumbosacral spine and pelvis, initial encounter for closed fracture: Secondary | ICD-10-CM | POA: Diagnosis present

## 2013-08-28 HISTORY — DX: Fracture of unspecified parts of lumbosacral spine and pelvis, initial encounter for closed fracture: S32.9XXA

## 2013-08-28 MED ORDER — PNEUMOCOCCAL VAC POLYVALENT 25 MCG/0.5ML IJ INJ
0.5000 mL | INJECTION | INTRAMUSCULAR | Status: AC
  Administered 2013-08-29: 0.5 mL via INTRAMUSCULAR
  Filled 2013-08-28 (×2): qty 0.5

## 2013-08-28 MED ORDER — DOCUSATE SODIUM 100 MG PO CAPS
100.0000 mg | ORAL_CAPSULE | Freq: Two times a day (BID) | ORAL | Status: DC
  Administered 2013-08-28 – 2013-08-29 (×3): 100 mg via ORAL
  Filled 2013-08-28 (×4): qty 1

## 2013-08-28 NOTE — Progress Notes (Signed)
Pt's daughter brought home medications, Gabapentin, Ibuprofen, Ropinirole, Losartan and Hydromorphone to pt.  Once medications noted to be in pt's room, daughter had left for home and pt informed daughter could not return to take medication back home.  Home medications sent to pharmacy.

## 2013-08-28 NOTE — Progress Notes (Addendum)
[facility] PROGRESS NOTE  Shelly Martin ZOX:[id number] DOB: 07/20/1918 DOA: 08/26/2013 PCP: Shelly Pillar, MD  Assessment/Plan: . Dizziness and  lightheadedness  - most likely secondary to orthostasis caused by multiple episodes of diarrhea. No syncopal episode. Resolved  with IV hydration. Seen by cardiology. Outpatient followup by cardiology. Stable on telemetry..  Prn dilaudid  for pain upon   . Right hip avascular necrosis with acute on chronic right hip pain.  CT scan done noted pelvic fracture contributing to pain and discomfort.seen by ortho and recommends non surgical management with pain control, PT and weight bearing as tolerated.   family unable to take care at home. SNF available for 4/6. D[facility]  Seen by orthopedics to follow with Dr. Ricki Martin in 2-3 weeks post discharge.  Currently weight bearing as tolerated. Able to get up with support and walk to bedside commode.   HTN  - continue home dose Cozaar, have added low-dose Coreg for better control along with as needed IV hydralazine.    Moderate aortic stenosis, mild reversible ischemia on recent stress test, chronic grade 1 diastolic CHF with EF 81%.  euvolemic and stable.  continue home dose Cozaar, added as needed hydralazine and Lopressor for better blood pressure control. Start aspirin by cardiology this admission.  Marland KitchenUTI -  On cipro. cx with no growth. Will d/c abx tomorrow after 3 days dose.    ? Pharyngeal fullness  - recommend outpatient CT Neck and  ENT follow up.      Code Status full code Family Communication: daughter at bedside Disposition Plan: SNF likely on 4/6   Consultants:  Cardiology   ortho  Procedures:  none  Antibiotics:  none  HPI/Subjective: No overnight issues  Objective: Filed Vitals:   08/28/13 1029  BP: 128/50  Pulse: 61  Temp:   Resp:     Intake/Output Summary (Last 24 hours) at 08/28/13 1225 Last data filed at  08/28/13 1029  Gross per 24 hour  Intake    723 ml  Output      0 ml  Net    723 ml   Filed Weights   08/26/13 0633 08/27/13 0640  Weight: 49.442 kg (109 lb) 50.168 kg (110 lb 9.6 oz)    Exam:   General: elderly female hard of heratring in NAD  HEENT: no pallor, moist mucosa   chest: clear b/l   CVS: S1 &S2 normal. 3/6 systolic murmur  Abd: soft, NT, ND, BS+  Ext: warm, no edema, limited ROM of hip due to pain  CNS: AAOX3  Data Reviewed: Basic Metabolic Panel:  Recent Labs Lab 08/26/13 0230 08/27/13 0515  NA 140 140  K 4.1 3.7  CL 102 102  CO2 26 25  GLUCOSE 105* 104*  BUN 32* 19  CREATININE 0.80 0.80  CALCIUM 8.8 7.9*   Liver Function Tests:  Recent Labs Lab 08/26/13 0230  AST 18  ALT 10  ALKPHOS 70  BILITOT 0.6  PROT 6.0  ALBUMIN 3.4*   No results found for this basename: LIPASE, AMYLASE,  in the last 168 hours No results found for this basename: AMMONIA,  in the last 168 hours CBC:  Recent Labs Lab 08/26/13 0137 08/27/13 0515  WBC 10.2 8.5  HGB 11.3* 10.3*  HCT 33.9* 30.7*  MCV 89.4 89.5  PLT 277 207   Cardiac Enzymes:  Recent Labs Lab 08/26/13 0230 08/26/13 0800 08/26/13 1505 08/26/13 2000  TROPONINI <0.30 <0.30 <0.30 <0.30  BNP (last 3 results)  Recent Labs  06/29/13 1750  PROBNP 902.2*   CBG: No results found for this basename: GLUCAP,  in the last 168 hours  Recent Results (from the past 240 hour(s))  URINE CULTURE     Status: None   Collection Time    08/26/13  7:42 AM      Result Value Ref Range Status   Specimen Description URINE, CLEAN CATCH   Final   Special Requests NONE   Final   Culture  Setup Time     Final   Value: 08/26/2013 13:23     Performed at [facility]     Final   Value: NO GROWTH     Performed at [facility]   Culture     Final   Value: NO GROWTH     Performed at [facility]   Report Status 08/27/2013 FINAL   Final     Studies: No results  found.  Scheduled Meds: . aspirin  81 mg Oral Daily  . carvedilol  3.125 mg Oral BID WC  . ciprofloxacin  250 mg Oral BID  . enoxaparin (LOVENOX) injection  40 mg Subcutaneous Q24H  . feeding supplement (ENSURE COMPLETE)  237 mL Oral BID BM  . gabapentin  300 mg Oral BID WC   And  . gabapentin  600 mg Oral QHS  . losartan  50 mg Oral BID  . [START ON 08/29/2013] pneumococcal 23 valent vaccine  0.5 mL Intramuscular Tomorrow-1000  . rOPINIRole  4 mg Oral QHS  . sodium chloride  3 mL Intravenous Q12H   Continuous Infusions:     Time spent: 25 minutes    Shelly Martin, [place]  [facility] Pager [phone] If 7PM-7AM, please contact night-coverage at [email or web address], password [facility] 08/28/2013, 12:25 PM  LOS: 2 days

## 2013-08-28 NOTE — Progress Notes (Signed)
Utilization review completed.  

## 2013-08-28 NOTE — Progress Notes (Addendum)
Clinical Education officer, museum (CSW) received call from [employer] stating that patient needs to be D/C'ed today due to the patient's observation status. CSW contacted the following skilled nursing facilities:  [facility]- CSW spoke with the on call administrator Peter Congo who reported that they could accept the patient today. CSW presented this bed offer to patient and her daughter Jan that was at bedside. Patient and Jan refused the bed offer.   [facility] spoke with Crystal the weekend admissions coordinator who reported that they could accept the patient if the Lovenox is discontinued. Per MD he will D/C the Lovenox prior to D/C. CSW presented this bed offer to the patient and her daughter Jan and they refused the bed offer.   [facility]- CSW spoke with Claiborne Billings the admissions coordinator who reported that she would have a bed for patient on Monday 08/29/13 but not today. Claiborne Billings reported that she will have a D/C tomorrow and then the patient could come on Monday.   [facility] spoke with weekend supervisor who reported that they do not do weekend admissions.   [facility] spoke with weekend supervisor who got in contact with the Director of Nursing who reported that they can't do weekend admissions.   [facility]- CSW spoke with weekend supervisor who reported that she would contact an Scientist, physiological and call CSW back. CSW awaiting phone call.   Patient's daughter reported that she wants the patient to [facility] to [facility], [facility], 3) [facility] and 4) [facility], [facility]. Daughter also reported that she can't take the patient home because she needs more care.    Blima Rich, Latanya Presser Weekend CSW [phone]  CSW contacted [facility] and spoke with the weekend supervisor who reported that if the admissions coordinator has not called CSW back then that means they can't offer a bed. CSW also called [facility] who reported that she is at home today and will look into this case. CSW awaiting call back from [facility].   Blima Rich, [place] Weekend CSW [phone]

## 2013-08-29 DIAGNOSIS — R42 Dizziness and giddiness: Secondary | ICD-10-CM

## 2013-08-29 MED ORDER — DSS 100 MG PO CAPS: 100.0000 mg | ORAL_CAPSULE | Freq: Two times a day (BID) | ORAL | Status: DC

## 2013-08-29 MED ORDER — CARVEDILOL 3.125 MG PO TABS: 3.1250 mg | ORAL_TABLET | Freq: Two times a day (BID) | ORAL | Status: DC

## 2013-08-29 NOTE — Discharge Summary (Signed)
Physician Discharge Summary  Shelly Martin [id number] DOB: April 04, 1919 DOA: 08/26/2013  PCP: Kelton Pillar, MD  Admit date: 08/26/2013 Discharge date: 08/29/2013  Time spent: 40  minutes  Recommendations for Outpatient Follow-up:  1. D/c to SNF 2.  follow up with Dr Brunetta Genera orthopedics) in 2-3 weeks  3. Schedule appt with ENT in 1 week  Discharge Diagnoses:  Principal Problem:   Pelvic fracture  Active Problems:   Hypertension   Restless legs syndrome   History of TIAs   Aortic stenosis   Fall   Dizziness   Discharge Condition: fair  Diet recommendation: low sodium  Filed Weights   08/26/13 0633 08/27/13 0640 08/29/13 0427  Weight: 49.442 kg (109 lb) 50.168 kg (110 lb 9.6 oz) 51.03 kg (112 lb 8 oz)    History of present illness:  Please refer to admission H&P for details, but in brief, 80 y.o. female with past medical history of hypertension, restless leg syndrome, chronic right hip pain for AVN, TIA, aortic stenosis, recent low-risk stress nuclear study with possible mild ischemia mid lateral wall segement.  The patient  presented with complaints of dizziness. She mentions that she had constipation since last few days and took suppository  today. After which she started having recurrent episodes of loose watery bowel movements without any blood. Since last one day she also has some increased burning urination. She has at her baseline urinary incontinence and excess frequency of urination which is unchanged. While she was getting out of the bed to go to the restroom and trying to get a walker she felt lightheaded which he describes as dizziness without vertigo and lost her balance and fell on the ground. She complained of right-sided hip pain after that and call her husband. She denies any loss of consciousness denies any head injury neck injury or any other injury.  She has been having on and off dizziness which is unchanged in frequency since last few months.   She had an echocardiogram which showed worsening of her aortic stenosis from mild to moderate and a low-risk but possible mild ischemia on stress test on August 22 2013 for preoperative workup for her right hip arthroplasty for AVN which is scheduled in August.  She complained of severe pain in her right hip and mentions is unable to walk. CT of pelvis showed minimally displaced fracture of superior right pubic ramus.   Hospital Course:  Dizziness and lightheadedness  - most likely secondary to orthostasis caused by multiple episodes of diarrhea. No syncopal episode.  Resolved with IV hydration. Seen by cardiology. Outpatient followup by cardiology. Stable on telemetry..  Prn dilaudid for pain   .Right hip avascular necrosis with acute on chronic right hip pain.  CT scan done noted pelvic fracture contributing to pain and discomfort. seen by ortho and recommends non surgical management with pain control, PT and weight bearing as tolerated. She is able to bear weight without difficulty. She needs some assistance to stand up but is able to ambulate short distance by herself.  family unable to take care at home and she is now ready for discharge to SNF.  follow with Dr. Ricki Martin in 2-3 weeks post discharge.  Currently weight bearing as tolerated. Able to get up with support and walk to bedside commode.   HTN  - continue home dose Cozaar, have added low-dose Coreg for better control   Moderate aortic stenosis, mild reversible ischemia on recent stress test, chronic grade 1 diastolic CHF with EF [id number].  euvolemic and stable.   Started on  aspirin by cardiology this admission.   UTI  On empiric ciprofloxacin and compelted 3 days course today. asymptomatic and . cx with no growth.   ? Pharyngeal fullness on CT cervical spine - recommend outpatient CT/ bs MRI  Neck and ENT follow up.    Code Status full code  Family Communication: spoke with daughter on 4/5 Disposition Plan: SNF     Consultants:  Cardiology  Ortho   Procedures:  None   Antibiotics:  Oral ciprofloxacin 4/3-4.6   Discharge Exam: Filed Vitals:   08/29/13 0427  BP: 160/62  Pulse: 69  Temp: 98.6 F (37 C)  Resp: 18   General: elderly female hard of heratring in NAD  HEENT: no pallor, moist mucosa  chest: clear b/l  CVS: S1 &S2 normal. 3/6 systolic murmur  Abd: soft, NT, ND, BS+  Ext: warm, no edema, limited ROM of hip due to pain  CNS: AAOX3    Discharge Instructions You were cared for by a hospitalist during your hospital stay. If you have any questions about your discharge medications or the care you received while you were in the hospital after you are discharged, you can call the unit and asked to speak with the hospitalist on call if the hospitalist that took care of you is not available. Once you are discharged, your primary care physician will handle any further medical issues. Please note that NO REFILLS for any discharge medications will be authorized once you are discharged, as it is imperative that you return to your primary care physician (or establish a relationship with a primary care physician if you do not have one) for your aftercare needs so that they can reassess your need for medications and monitor your lab values.  Discharge Orders   Future Orders Complete By Expires   Diet - low sodium heart healthy  As directed    Discharge instructions  As directed    Comments:        Increase activity slowly  As directed        Medication List         amLODipine 10 MG tablet  Commonly known as:  NORVASC  Take 1 tablet (10 mg total) by mouth daily.     aspirin 81 MG chewable tablet  Chew 1 tablet (81 mg total) by mouth daily.     carvedilol 3.125 MG tablet  Commonly known as:  COREG  Take 1 tablet (3.125 mg total) by mouth 2 (two) times daily with a meal.     DSS 100 MG Caps  Take 100 mg by mouth 2 (two) times daily.     feeding supplement (ENSURE COMPLETE)  Liqd  Take 237 mLs by mouth 2 (two) times daily between meals.     gabapentin 300 MG capsule  Commonly known as:  NEURONTIN  Take 300-600 mg by mouth See admin instructions. Take 300mg  in the morning, 300mg  at 12, and 2 tablets (600mg ) in the evening.     HYDROmorphone 4 MG tablet  Commonly known as:  DILAUDID  Take 1 tablet (4 mg total) by mouth every 6 (six) hours as needed for severe pain.     ibuprofen 200 MG tablet  Commonly known as:  ADVIL,MOTRIN  Take 200-400 mg by mouth 3 (three) times daily as needed for moderate pain.     losartan 50 MG tablet  Commonly known as:  COZAAR  TAKE 1 TABLET BY MOUTH TWICE DAILY  rOPINIRole 2 MG tablet  Commonly known as:  REQUIP  Take 2 mg by mouth at bedtime. 1-2 tab bid prn       Allergies  Allergen Reactions  . Amoxicillin Other (See Comments)    Reaction unknown  . Baclofen Other (See Comments)    "fuzzy in the eyes" and dizzy  . Hctz [Hydrochlorothiazide]     nausea  . Valium [Diazepam] Other (See Comments)    Pt became unresponsive and O2 sats dropped.       Follow-up Information   Follow up with SCHOENHOFF,DEBBIE, MD. Schedule an appointment as soon as possible for a visit in 3 days.   Specialty:  Internal Medicine   Contact information:   [place] [address] [phone]       Follow up with Gearlean Alf, MD. Schedule an appointment as soon as possible for a visit in 1 week.   Specialty:  Orthopedic Surgery   Contact information:   [address] [place] [address] [phone]       Follow up with Melony Overly, MD. Schedule an appointment as soon as possible for a visit in 1 week. (CT Neck for Throat mass)    Specialty:  Otolaryngology   Contact information:   [place] [address] [phone]       Follow up with [facility], MD In 1 week. (after discharge from SNF)    Specialty:  Internal Medicine   Contact  information:   [place] [address] [phone]        The results of significant diagnostics from this hospitalization (including imaging, microbiology, ancillary and laboratory) are listed below for reference.    Significant Diagnostic Studies: Dg Chest 1 View  08/26/2013   CLINICAL DATA:  Fall, right hip pain  EXAM: CHEST - 1 VIEW  COMPARISON:  Prior chest x-ray 06/29/2013  FINDINGS: Stable cardiomegaly with left heart enlargement. Mediastinal contours are unchanged. Low inspiratory volumes. No pleural effusion, pneumothorax or focal airspace consolidation. No suspicious pulmonary mass or nodule. No acute osseous abnormality. Mild degenerative changes in the right shoulder.  IMPRESSION: No active disease.   Electronically Signed   By: Jacqulynn Cadet M.D.   On: 08/26/2013 02:31   Dg Pelvis 1-2 Views  08/26/2013   CLINICAL DATA:  Fall, right hip pain  EXAM: PELVIS - 1-2 VIEW  COMPARISON:  Prior abdominal radiographs 09/30/2012  FINDINGS: The bones are diffusely osteopenic. Numerous surgical ligatures project over the lower abdomen and pelvis. Linear artifact projects over the pelvis secondary to multiple cardiac leads. No acute fracture or malalignment identified. Compared to May of 2014 there has been significant interval progression of right hip osteoarthritis with increased loss of the superolateral joint space and developing subchondral cyst in the acetabulum. Additionally, the femoral head is sclerotic and demonstrates irregularity of the articular surface. Overall findings are concerning for progressive avascular necrosis and secondary osteoarthritis. Lower lumbar degenerative disc disease and facet arthropathy.  IMPRESSION: 1. No acute fracture identified. Marked osteopenia limits evaluation for nondisplaced fractures. 2. Findings suggest developing right femoral head avascular necrosis and progressive secondary right hip osteoarthritis compared to 09/30/2012.    Electronically Signed   By: Jacqulynn Cadet M.D.   On: 08/26/2013 02:30   Ct Head Wo Contrast  08/26/2013   CLINICAL DATA:  Unwitnessed fall, dizziness.  EXAM: CT HEAD WITHOUT CONTRAST  CT CERVICAL SPINE WITHOUT CONTRAST  TECHNIQUE:  Multidetector CT imaging of the head and cervical spine was performed following the standard protocol without intravenous contrast. Multiplanar CT image reconstructions of the cervical spine were also generated.  COMPARISON:  CT HEAD W/O CM dated 06/03/2013; CT MAXILLOFACIAL W/O CM dated 06/03/2013; DG FLUORO GUIDE NDL PLC/BX dated 05/20/2012; CT HEAD W/O CM dated 12/20/2011  FINDINGS: CT HEAD FINDINGS  The ventricles and sulci are normal for age. No intraparenchymal hemorrhage, mass effect nor midline shift. Patchy supratentorial white matter hypodensities are less than expected for patient's age and though non-specific suggest sequelae of chronic small vessel ischemic disease. No acute large vascular territory infarcts.  No abnormal extra-axial fluid collections. 11 mm left parafalcine meningioma again seen without mass effect. Basal cisterns are patent. Moderate calcific atherosclerosis of the carotid siphons.  No skull fracture. Trace paranasal sinus mucosal thickening without air-fluid levels. The mastoid air cells are well aerated. . The included ocular globes and orbital contents are non-suspicious. Status post right ocular lens implant.  CT CERVICAL SPINE FINDINGS  Cervical vertebral bodies and posterior elements are intact. Grade 1 C2-3 anterolisthesis, grade 1 C7-T1 anterolisthesis on degenerative basis. Severe C3-4 thru C6-7 degenerative disc disease. C1-2 articulation maintained with severe arthropathy and possible small os auto none 4 8 a.m. Mild broad dextroscoliosis. No destructive bony lesions.  Asymmetric fullness of the right pharyngeal soft tissues, with coarse calcification, axial 41/80. This could reflect very tortuous vascular structures or mass contiguous with the  right sternocleidomastoid muscle posteriorly. This is stable from June 03, 2013  Degenerative disc disease and facet arthropathy results in severe canal stenosis at C5-6, mild-to-moderate C4-5. Severe neural foraminal narrowing C3-4 thru C6-7.  IMPRESSION: CT head:  No acute intracranial process.  Stable left parafalcine 11 mm meningioma without mass effect. Involutional changes. Mild white matter changes suggest chronic small vessel ischemic disease.  CT cervical spine: No acute cervical spine fracture. Grade 1 C2-3 and grade 1 C7-T1 anterolisthesis with on degenerative basis.  Asymmetric fullness of the right pharyngeal soft tissues, which could reflect prominent vascular structure, less likely mass in would be better characterized on CT or MRI of the neck with contrast on a nonemergent basis, as clinically indicated.   Electronically Signed   By: Elon Alas   On: 08/26/2013 03:22   Ct Cervical Spine Wo Contrast  08/26/2013   CLINICAL DATA:  Unwitnessed fall, dizziness.  EXAM: CT HEAD WITHOUT CONTRAST  CT CERVICAL SPINE WITHOUT CONTRAST  TECHNIQUE: Multidetector CT imaging of the head and cervical spine was performed following the standard protocol without intravenous contrast. Multiplanar CT image reconstructions of the cervical spine were also generated.  COMPARISON:  CT HEAD W/O CM dated 06/03/2013; CT MAXILLOFACIAL W/O CM dated 06/03/2013; DG FLUORO GUIDE NDL PLC/BX dated 05/20/2012; CT HEAD W/O CM dated 12/20/2011  FINDINGS: CT HEAD FINDINGS  The ventricles and sulci are normal for age. No intraparenchymal hemorrhage, mass effect nor midline shift. Patchy supratentorial white matter hypodensities are less than expected for patient's age and though non-specific suggest sequelae of chronic small vessel ischemic disease. No acute large vascular territory infarcts.  No abnormal extra-axial fluid collections. 11 mm left parafalcine meningioma again seen without mass effect. Basal cisterns are patent.  Moderate calcific atherosclerosis of the carotid siphons.  No skull fracture. Trace paranasal sinus mucosal thickening without air-fluid levels. The mastoid air cells are well aerated. . The included ocular globes and orbital contents are non-suspicious. Status post right ocular lens implant.  CT CERVICAL SPINE FINDINGS  Cervical  vertebral bodies and posterior elements are intact. Grade 1 C2-3 anterolisthesis, grade 1 C7-T1 anterolisthesis on degenerative basis. Severe C3-4 thru C6-7 degenerative disc disease. C1-2 articulation maintained with severe arthropathy and possible small os auto none 4 8 a.m. Mild broad dextroscoliosis. No destructive bony lesions.  Asymmetric fullness of the right pharyngeal soft tissues, with coarse calcification, axial 41/80. This could reflect very tortuous vascular structures or mass contiguous with the right sternocleidomastoid muscle posteriorly. This is stable from June 03, 2013  Degenerative disc disease and facet arthropathy results in severe canal stenosis at C5-6, mild-to-moderate C4-5. Severe neural foraminal narrowing C3-4 thru C6-7.  IMPRESSION: CT head:  No acute intracranial process.  Stable left parafalcine 11 mm meningioma without mass effect. Involutional changes. Mild white matter changes suggest chronic small vessel ischemic disease.  CT cervical spine: No acute cervical spine fracture. Grade 1 C2-3 and grade 1 C7-T1 anterolisthesis with on degenerative basis.  Asymmetric fullness of the right pharyngeal soft tissues, which could reflect prominent vascular structure, less likely mass in would be better characterized on CT or MRI of the neck with contrast on a nonemergent basis, as clinically indicated.   Electronically Signed   By: Elon Alas   On: 08/26/2013 03:22   Ct Hip Right Wo Contrast  08/26/2013   CLINICAL DATA:  Pain without trauma  EXAM: CT OF THE RIGHT HIP WITHOUT CONTRAST  TECHNIQUE: Multidetector CT imaging was performed according to the  standard protocol. Multiplanar CT image reconstructions were also generated.  COMPARISON:  Radiographs from earlier the same day, and earlier studies  FINDINGS: Minimally displaced fracture of the superior right pubic ramus. No pelvic hematoma. Proximal femur intact. Advanced right hip osteoarthritis with bone on bone apposition superiorly. Subchondral cysts/ geodes in the superior acetabulum and femoral head. Subchondral sclerosis in the superior acetabulum and femoral head. There is some mild remodeling of the femoral head. Marginal spurs from the acetabulum and femoral head.  Patchy iliofemoral arterial calcifications. Anterior abdominal wall suture material.  IMPRESSION: 1. Minimally displaced fracture, superior right pubic ramus. 2. Advanced right hip osteoarthritis.   Electronically Signed   By: Arne Cleveland M.D.   On: 08/26/2013 09:27    Microbiology: Recent Results (from the past 240 hour(s))  URINE CULTURE     Status: None   Collection Time    08/26/13  7:42 AM      Result Value Ref Range Status   Specimen Description URINE, CLEAN CATCH   Final   Special Requests NONE   Final   Culture  Setup Time     Final   Value: 08/26/2013 13:23     Performed at [place]     Final   Value: NO GROWTH     Performed at [facility]   Culture     Final   Value: NO GROWTH     Performed at [facility]   Report Status 08/27/2013 FINAL   Final     Labs: Basic Metabolic Panel:  Recent Labs Lab 08/26/13 0230 08/27/13 0515  NA 140 140  K 4.1 3.7  CL 102 102  CO2 26 25  GLUCOSE 105* 104*  BUN 32* 19  CREATININE 0.80 0.80  CALCIUM 8.8 7.9*   Liver Function Tests:  Recent Labs Lab 08/26/13 0230  AST 18  ALT 10  ALKPHOS 70  BILITOT 0.6  PROT 6.0  ALBUMIN 3.4*   No results found for this basename: LIPASE, AMYLASE,  in the last 168 hours No results found for this basename: AMMONIA,  in the last 168 hours CBC:  Recent Labs Lab  08/26/13 0137 08/27/13 0515  WBC 10.2 8.5  HGB 11.3* 10.3*  HCT 33.9* 30.7*  MCV 89.4 89.5  PLT 277 207   Cardiac Enzymes:  Recent Labs Lab 08/26/13 0230 08/26/13 0800 08/26/13 1505 08/26/13 2000  TROPONINI <0.30 <0.30 <0.30 <0.30   BNP: BNP (last 3 results)  Recent Labs  06/29/13 1750  PROBNP 902.2*   CBG: No results found for this basename: GLUCAP,  in the last 168 hours     Signed:  Amrutha Avera  [facility] 08/29/2013, 11:32 AM

## 2013-08-29 NOTE — Progress Notes (Signed)
Physical Therapy Treatment Patient Details Name: Shelly Martin MRN: [id number] DOB: August 13, 1918 Today's Date: 08/29/2013    History of Present Illness Pt. admitted following a fall (syncopal episode) resulting in R superior pubic ramus fx, non displaced.  Pt. also with end stage OA of R hip and had THA scheduled tentatively for August.      PT Comments    Pt progressing well towards physical therapy goals, ambulating up to 100 feet with min guard for safety. Pt received education for therapeutic exercises to improve strength for functional mobility. Pt complains of R knee pain "as painful as pelvic fracture." Noted tenderness at joint line and very taut iliotibial band; instructed on self cross friction massage techniques. Pt would benefit from continued PT at her next venue of care for safety with ambulation including improved gait mechanics, strength, endurance, pain management and overall functional mobility.  Follow Up Recommendations  SNF     Equipment Recommendations  None recommended by PT    Recommendations for Other Services       Precautions / Restrictions Precautions Precautions: Fall Restrictions Weight Bearing Restrictions: Yes RLE Weight Bearing: Weight bearing as tolerated    Mobility  Bed Mobility Overal bed mobility: Modified Independent             General bed mobility comments: Pt able to sit EOB without assist. No verbal cues needed  Transfers Overall transfer level: Needs assistance Equipment used: Rolling walker (2 wheeled) Transfers: Sit to/from Stand Sit to Stand: Min guard         General transfer comment: Min guard for safety. Verbal cues for hand placement, good control with Sit<>stand.  Ambulation/Gait Ambulation/Gait assistance: Min guard Ambulation Distance (Feet): 100 Feet Assistive device: Rolling walker (2 wheeled) Gait Pattern/deviations: Step-through pattern;Decreased stride length;Narrow base of support Gait velocity:  Decreased   General Gait Details: Pt requires frequent verbal cues for walker placement cloer to BOS. No LOB or complaints of dizziness during ambulation.  States she feels like walking "loosens" her up.   Stairs            Wheelchair Mobility    Modified Rankin (Stroke Patients Only)       Balance                                    Cognition Arousal/Alertness: Awake/alert Behavior During Therapy: WFL for tasks assessed/performed Overall Cognitive Status: Within Functional Limits for tasks assessed                      Exercises General Exercises - Lower Extremity Ankle Circles/Pumps: AROM;Both;10 reps;Seated Long Arc Quad: AROM;Both;10 reps;Seated Hip Flexion/Marching: AROM;Both;10 reps;Standing Mini-Sqauts: AROM;10 reps;Standing    General Comments General comments (skin integrity, edema, etc.): Pt complains of R knee pain, tender at joint line, and taut iliotibial band near insertion. Pt educated on self cross friction massage and given hand out for Home exercises      Pertinent Vitals/Pain Pt states her pain is tolerable but is "11/10" Nurse notified Pt repositioned in chair for comfort.    Home Living                      Prior Function            PT Goals (current goals can now be found in the care plan section) Acute Rehab PT Goals Patient Stated Goal: home for  recovery and hopeful THA later in the year PT Goal Formulation: With patient Time For Goal Achievement: 09/02/13 Potential to Achieve Goals: Good Progress towards PT goals: Progressing toward goals    Frequency  Min 3X/week    PT Plan Frequency needs to be updated;Current plan remains appropriate    Co-evaluation             End of Session Equipment Utilized During Treatment: Gait belt Activity Tolerance: Patient tolerated treatment well Patient left: in chair;with call bell/phone within reach     Time: 1125-1150 PT Time Calculation (min): 25  min  Charges:  $Gait Training: 8-22 mins $Therapeutic Exercise: 8-22 mins                    G Codes:      [employer], [place]   Ellouise Newer 08/29/2013, 12:09 PM

## 2013-08-29 NOTE — Progress Notes (Signed)
Attempted to call report to [facility], however no one picked up the phone.

## 2013-08-29 NOTE — Discharge Instructions (Addendum)
° °  Stable Pelvic Fracture, Adult You have one or more fractures (this means there is a break in the bones) of the pelvis. The pelvis is the ring of bones that make up your hipbones. These are the bones you sit on and the lower part of the spine. It is like a boney ring where your legs attach and which supports your upper body. You have an un-displaced fracture. This means the bones are in good position. The pelvic fracture you have is a simple (uncomplicated) fracture. DIAGNOSIS  X-rays usually diagnose these fractures. TREATMENT  The goals of treating pelvic fractures are to get the bones to heal in a good position. The patient should return to normal activities as soon as possible. Such fractures are often treated with normal bed rest and conservative measures.  HOME CARE INSTRUCTIONS   You should be on bed rest for as long as directed by your caregiver. Change positions of your legs every 1-2 hours to maintain good blood flow. You may sit as long as is tolerable. Following this, you may do usual activities, but avoid strenuous activities for as long as directed by your caregiver.  Only take over-the-counter or prescription medicines for pain, discomfort, or fever as directed by your caregiver.  Bed-rest may also be used for discomfort.  Resume your activities when you are able. Use a cain or crutch on the injured side to reduce pain while walking, as needed.  If you develop increased pain or discomfort not relieved with medications, contact your caregiver.  Warning: Do not drive a car or operate a motor vehicle until your caregiver specifically tells you it is safe to do so. SEEK IMMEDIATE MEDICAL CARE IF:   You feel light-headed or faint, develop chest pain or shortness of breath.  An unexplained oral temperature above 102 F (38.9 C) develops.  You develop blood in the urine or in the stools.  There is difficulty urinating, and/or having a bowel movement, or pain with these  efforts.  There is a difficulty or increased pain with walking.  There is swelling in one or both legs that is not normal. Document Released: 07/21/2001 Document Revised: 01/12/2013 Document Reviewed: 12/24/2007 [facility] Patient Information 2014 [place], [place].

## 2013-08-30 ENCOUNTER — Encounter: Payer: Self-pay | Admitting: Internal Medicine

## 2013-08-30 ENCOUNTER — Other Ambulatory Visit: Payer: Self-pay | Admitting: *Deleted

## 2013-08-30 ENCOUNTER — Non-Acute Institutional Stay (SKILLED_NURSING_FACILITY): Payer: Medicare Other | Admitting: Internal Medicine

## 2013-08-30 DIAGNOSIS — G2581 Restless legs syndrome: Secondary | ICD-10-CM

## 2013-08-30 DIAGNOSIS — I10 Essential (primary) hypertension: Secondary | ICD-10-CM

## 2013-08-30 DIAGNOSIS — J392 Other diseases of pharynx: Secondary | ICD-10-CM

## 2013-08-30 DIAGNOSIS — R42 Dizziness and giddiness: Secondary | ICD-10-CM

## 2013-08-30 DIAGNOSIS — S329XXA Fracture of unspecified parts of lumbosacral spine and pelvis, initial encounter for closed fracture: Secondary | ICD-10-CM

## 2013-08-30 DIAGNOSIS — I350 Nonrheumatic aortic (valve) stenosis: Secondary | ICD-10-CM

## 2013-08-30 DIAGNOSIS — G8929 Other chronic pain: Secondary | ICD-10-CM

## 2013-08-30 DIAGNOSIS — I359 Nonrheumatic aortic valve disorder, unspecified: Secondary | ICD-10-CM

## 2013-08-30 DIAGNOSIS — M25551 Pain in right hip: Secondary | ICD-10-CM

## 2013-08-30 DIAGNOSIS — M25559 Pain in unspecified hip: Secondary | ICD-10-CM

## 2013-08-30 MED ORDER — HYDROMORPHONE HCL 4 MG PO TABS: ORAL_TABLET | ORAL | Status: DC

## 2013-08-30 NOTE — Assessment & Plan Note (Signed)
Seen on Ct c spine-follow up ENT outpt

## 2013-08-30 NOTE — Assessment & Plan Note (Signed)
-   most likely secondary to orthostasis caused by multiple episodes of diarrhea. No syncopal episode.  Resolved with IV hydration. Seen by cardiology. Outpatient followup by cardiology. Stable on telemetry..  Prn dilaudid for pain

## 2013-08-30 NOTE — Assessment & Plan Note (Signed)
Contnue cozaar, norvasc, coreg

## 2013-08-30 NOTE — Assessment & Plan Note (Signed)
See above , chronic hip pain

## 2013-08-30 NOTE — Assessment & Plan Note (Signed)
started on ASA this admission; grade 1 diastolic dysfunction

## 2013-08-30 NOTE — Progress Notes (Signed)
MRN: [id number] Name: Shelly Martin  Sex: female Age: 80 y.o. DOB: 23-Mar-1919  [place] #: [facility] Facility/Room: 118 Level Of Care: SNF Provider: [name] Emergency Contacts: Extended Emergency Contact Information Primary Emergency Contact: [name] Address: [place], [place] [address] [place] Phone: [phone] Relation: Daughter  Code Status: FULL  Allergies: Amoxicillin; Baclofen; Hctz; and Valium  Chief Complaint  Patient presents with  . SNF admission    HPI: Patient is 80 y.o. female who is s/p pelvic fx and chronic R hip pain from AVN admitted for OT/PT.  Past Medical History  Diagnosis Date  . Hypertension   . Restless legs syndrome     on Requip  . Ulcer causing bleeding and hole in wall of stomach or small intestine   . Gastric ulcer     years ago  . Arthritis     Back, knees  . Constipation   . Urinary bladder calculus   . Urinary incontinence   . Bowel incontinence   . Aortic stenosis   . Stroke     Mini, no residual    Past Surgical History  Procedure Laterality Date  . Colon resection  50 years ago  . Tonsillectomy    . Uterine fibroid surgery    . Lumbar laminectomy/decompression microdiscectomy  06/25/2011    Procedure: LUMBAR LAMINECTOMY/DECOMPRESSION MICRODISCECTOMY;  Surgeon: Hosie Spangle, MD;  Location: [facility];  Service: Neurosurgery;  Laterality: Right;  RIGHT Lumbar Two-Three hemilaminectomy and microdiskectomy  . Vaginal hysterectomy        Medication List       This list is accurate as of: 08/30/13 11:59 PM.  Always use your most recent med list.               amLODipine 10 MG tablet  Commonly known as:  NORVASC  Take 1 tablet (10 mg total) by mouth daily.     aspirin 81 MG chewable tablet  Chew 1 tablet (81 mg total) by mouth daily.     carvedilol 3.125 MG tablet  Commonly known as:  COREG  Take 1 tablet (3.125 mg total) by mouth 2 (two) times daily with  a meal.     DSS 100 MG Caps  Take 100 mg by mouth 2 (two) times daily.     feeding supplement (ENSURE COMPLETE) Liqd  Take 237 mLs by mouth 2 (two) times daily between meals.     gabapentin 300 MG capsule  Commonly known as:  NEURONTIN  Take 300-600 mg by mouth See admin instructions. Take 300mg  in the morning, 300mg  at 12, and 2 tablets (600mg ) in the evening.     HYDROmorphone 4 MG tablet  Commonly known as:  DILAUDID  Take one tablet by mouth every 6 hours as needed for pain     ibuprofen 200 MG tablet  Commonly known as:  ADVIL,MOTRIN  Take 200-400 mg by mouth 3 (three) times daily as needed for moderate pain.     losartan 50 MG tablet  Commonly known as:  COZAAR  TAKE 1 TABLET BY MOUTH TWICE DAILY     rOPINIRole 2 MG tablet  Commonly known as:  REQUIP  Take 2 mg by mouth at bedtime. 1-2 tab bid prn        No orders of the defined types were placed in this encounter.    Immunization History  Administered Date(s) Administered  . Influenza Split  04/15/2012  . Pneumococcal Polysaccharide-23 08/29/2013    History  Substance Use Topics  . Smoking status: Never Smoker   . Smokeless tobacco: Never Used  . Alcohol Use: Yes     Comment: rarely    Family history is noncontributory    Review of Systems  DATA OBTAINED: from patient GENERAL: Feels well no fevers, fatigue, appetite changes SKIN: No itching, rash  EYES: No eye pain, redness, discharge EARS: No earache, tinnitus, change in hearing NOSE: No congestion, drainage or bleeding  MOUTH/THROAT: No mouth or tooth pain, No sore throat RESPIRATORY: No cough, wheezing, SOB CARDIAC: No chest pain, palpitations, lower extremity edema  GI: No abdominal pain, No N/V/D or constipation, No heartburn or reflux  GU: No dysuria, frequency or urgency, or incontinence  MUSCULOSKELETAL: pt is interested in maybe trying to use less pain medication;she has some concern since she fell; she had been on dilaudid 4mg  for about a  month NEUROLOGIC: No headache, dizziness or focal weakness PSYCHIATRIC: No overt anxiety or sadness. Sleeps well. No behavior issue.   Filed Vitals:   08/30/13 1230  BP: 154/110  Pulse: 65  Temp: 98.1 F (36.7 C)  Resp: 18    Physical Exam  GENERAL APPEARANCE: Alert, conversant. Appropriately groomed. No acute distress.  SKIN: No diaphoresis rash HEAD: Normocephalic, atraumatic  EYES: Conjunctiva/lids clear. Pupils round, reactive. EOMs intact.  EARS: External exam WNL, canals clear. Hearing grossly normal.  NOSE: No deformity or discharge.  MOUTH/THROAT: Lips w/o lesions  RESPIRATORY: Breathing is even, unlabored. Lung sounds are clear   CARDIOVASCULAR: Heart RRR no murmurs, rubs or gallops. No peripheral edema.  GASTROINTESTINAL: Abdomen is soft, non-tender, not distended w/ normal bowel sounds GENITOURINARY: Bladder non tender, not distended  MUSCULOSKELETAL: No abnormal joints or musculature NEUROLOGIC: Oriented X3. Cranial nerves 2-12 grossly intact. Moves all extremities PSYCHIATRIC: Mood and affect appropriate to situation, no behavioral issues  Patient Active Problem List   Diagnosis Date Noted  . Pharyngeal swelling 08/30/2013  . Pelvic fracture 08/28/2013  . Fall 08/26/2013  . Dizziness 08/26/2013  . Abnormal stress test 08/26/2013  . Preop cardiovascular exam 08/01/2013  . Aortic stenosis 08/01/2013  . Edema 06/30/2013  . Loss of weight 01/04/2013  . Situational stress 01/04/2013  . Chronic right hip pain 07/05/2012  . Knee pain, acute 06/29/2012  . Vertigo 06/24/2012  . Acute exacerbation of chronic low back pain 06/24/2012  . Lumbar pain with radiation down right leg 06/09/2012  . DJD (degenerative joint disease) of hip 04/29/2012  . History of TIAs 04/17/2012  . S/P total hysterectomy and bilateral salpingo-oophorectomy 04/17/2012  . Hard of hearing 04/17/2012  . History of anemia 04/17/2012  . RLS (restless legs syndrome) 04/17/2012  . Chronic back  pain 04/17/2012  . Spinal stenosis, lumbar 04/17/2012  . Lower back pain 12/20/2011  . Personal history of colonic polyps 10/08/2011  . H/O: CVA (cardiovascular accident) 09/14/2011  . Hypertension 09/14/2011  . Restless legs syndrome 09/14/2011  . Unspecified constipation 08/20/2011  . Urinary incontinence 08/20/2011    CBC    Component Value Date/Time   WBC 8.5 08/27/2013 0515   RBC 3.43* 08/27/2013 0515   HGB 10.3* 08/27/2013 0515   HCT 30.7* 08/27/2013 0515   PLT 207 08/27/2013 0515   MCV 89.5 08/27/2013 0515   LYMPHSABS 1.2 06/29/2013 1750   MONOABS 0.4 06/29/2013 1750   EOSABS 0.1 06/29/2013 1750   BASOSABS 0.0 06/29/2013 1750    CMP     Component Value  Date/Time   NA 140 08/27/2013 0515   K 3.7 08/27/2013 0515   CL 102 08/27/2013 0515   CO2 25 08/27/2013 0515   GLUCOSE 104* 08/27/2013 0515   BUN 19 08/27/2013 0515   CREATININE 0.80 08/27/2013 0515   CREATININE 0.96 01/04/2013 1258   CALCIUM 7.9* 08/27/2013 0515   PROT 6.0 08/26/2013 0230   ALBUMIN 3.4* 08/26/2013 0230   AST 18 08/26/2013 0230   ALT 10 08/26/2013 0230   ALKPHOS 70 08/26/2013 0230   BILITOT 0.6 08/26/2013 0230   GFRNONAA 62* 08/27/2013 0515   GFRAA 71* 08/27/2013 0515    Assessment and Plan  Dizziness - most likely secondary to orthostasis caused by multiple episodes of diarrhea. No syncopal episode.  Resolved with IV hydration. Seen by cardiology. Outpatient followup by cardiology. Stable on telemetry..  Prn dilaudid for pain    Chronic right hip pain CT scan done noted pelvic fracture contributing to pain and discomfort. seen by ortho and recommends non surgical management with pain control, PT and weight bearing as tolerated. She is able to bear weight without difficulty. She needs some assistance to stand up but is able to ambulate short distance by herself.  family unable to take care at home and she is now ready for discharge to SNF. follow with Dr. Ricki Rodriguez in 2-3 weeks post discharge.  Currently weight bearing as tolerated. Able to  get up with support and walk to bedside commode.    Hypertension Contnue cozaar, norvasc, coreg  Aortic stenosis started on ASA this admission; grade 1 diastolic dysfunction  Pelvic fracture See above , chronic hip pain  Pharyngeal swelling Seen on Ct c spine-follow up ENT outpt  RLS (restless legs syndrome) Continue requip    [name], MD

## 2013-08-30 NOTE — Assessment & Plan Note (Signed)
CT scan done noted pelvic fracture contributing to pain and discomfort. seen by ortho and recommends non surgical management with pain control, PT and weight bearing as tolerated. She is able to bear weight without difficulty. She needs some assistance to stand up but is able to ambulate short distance by herself.  family unable to take care at home and she is now ready for discharge to SNF. follow with Dr. Ricki Rodriguez in 2-3 weeks post discharge.  Currently weight bearing as tolerated. Able to get up with support and walk to bedside commode.

## 2013-08-30 NOTE — Assessment & Plan Note (Signed)
Continue requip

## 2013-09-07 ENCOUNTER — Encounter: Payer: Self-pay | Admitting: Internal Medicine

## 2013-09-07 ENCOUNTER — Non-Acute Institutional Stay (SKILLED_NURSING_FACILITY): Payer: Medicare Other | Admitting: Internal Medicine

## 2013-09-07 DIAGNOSIS — H8110 Benign paroxysmal vertigo, unspecified ear: Secondary | ICD-10-CM

## 2013-09-07 DIAGNOSIS — S32599A Other specified fracture of unspecified pubis, initial encounter for closed fracture: Secondary | ICD-10-CM

## 2013-09-07 DIAGNOSIS — M48062 Spinal stenosis, lumbar region with neurogenic claudication: Secondary | ICD-10-CM | POA: Insufficient documentation

## 2013-09-07 DIAGNOSIS — G2581 Restless legs syndrome: Secondary | ICD-10-CM

## 2013-09-07 DIAGNOSIS — I10 Essential (primary) hypertension: Secondary | ICD-10-CM

## 2013-09-07 DIAGNOSIS — IMO0002 Reserved for concepts with insufficient information to code with codable children: Secondary | ICD-10-CM

## 2013-09-07 DIAGNOSIS — I350 Nonrheumatic aortic (valve) stenosis: Secondary | ICD-10-CM

## 2013-09-07 DIAGNOSIS — S32599S Other specified fracture of unspecified pubis, sequela: Secondary | ICD-10-CM

## 2013-09-07 DIAGNOSIS — J392 Other diseases of pharynx: Secondary | ICD-10-CM

## 2013-09-07 DIAGNOSIS — M1611 Unilateral primary osteoarthritis, right hip: Secondary | ICD-10-CM | POA: Insufficient documentation

## 2013-09-07 DIAGNOSIS — I359 Nonrheumatic aortic valve disorder, unspecified: Secondary | ICD-10-CM

## 2013-09-07 DIAGNOSIS — M1610 Unilateral primary osteoarthritis, unspecified hip: Secondary | ICD-10-CM

## 2013-09-07 HISTORY — DX: Benign paroxysmal vertigo, unspecified ear: H81.10

## 2013-09-07 HISTORY — DX: Spinal stenosis, lumbar region with neurogenic claudication: M48.062

## 2013-09-07 HISTORY — DX: Other specified fracture of unspecified pubis, initial encounter for closed fracture: S32.599A

## 2013-09-07 NOTE — Progress Notes (Signed)
Patient ID: Shelly Martin, female   DOB: September 17, 1918, 80 y.o.   MRN: [id number]  Location:  [facility] Provider:  Rexene Edison. Mariea Clonts, D.O., C.M.D.  Code Status:  Full code  Chief Complaint  Patient presents with  . Acute Visit    pain management    HPI:   C/o pain in right shoulder, right hip and groin, down leg to right calf, low back, as well.  Has difficulty doing therapy due to poorly controlled pain.  Pt tells me she had seen Dr. Wynelle Link and has surgery scheduled for right total hip replacement in august of this year.  She also says she has a fx in her right side of her pelvis.  She says she was already cleared by cardiology for the procedure (Dr. Stanford Breed).  She also c/o loss of bowel and bladder control that is unpredictable.  She has h/o lumbar laminectomy and decompression microdiscectomy 06/25/11 by Dr.Nudelman.  She was already having urinary incontinence in 3/13 per historical notes.  Notes indicate she continued with back problems as of 4/13.  She had an MRI at that time showing increased severe spinal stenosis and severe lateral recess compression L3-4, still severe spinal stenosis at L1-2, L2-3, and L4-5.  In July, she continued with severe back pain and was hospitalized and put on IV dilaudid and morphine--MRI was done again w/o acute changes.  She was then discharged back on hydrocodone and off her gabapentin.  She was the seen by Dr. Carlota Raspberry at urgent care and sent back to the ED.   Notes indicated problems with explosive diarrhea as of 11/13.  At some point in the ED or by Dr. Rita Ohara, oxycodone.  She later was seen by Dr. Lucia Estelle re: pain mgt.  Hospitalized again 1/14 due to dizziness--was felt to be vestibular neuronitis vs BPPV. She then had a fall 2/4 twisting her right knee. Steroid injection was given and xray showed tricompartmental arthritis.  She had ongoing difficulty with low back and right hip pain since.  She saw Dr. Maryjean Ka as well for pain mgt.  She  then saw Dr. Wynelle Link.  She had a fall 08/26/13 and sustained a nondisplaced right superior pubic ramus fracture.  She also has end stage right hip osteoarthritis that was exacerbated by her fall on 4/3.  She was unable to ambulate so she was sent here for short term rehab.  She is still having severe pain even with dilaudid and ibuprofen use thought staff note she does not always get her dilaudid.  She feels it makes her not herself.  She gets drowsy.  She also continues with urinary and fecal incontinence.  She was to f/u in 2-3 weeks with Dr. Wynelle Link  Review of Systems:  Review of Systems  Constitutional: Negative for fever.  HENT: Negative for congestion.   Eyes: Negative for blurred vision.  Respiratory: Negative for shortness of breath.   Cardiovascular: Negative for chest pain.  Gastrointestinal: Positive for constipation.  Genitourinary: Negative for dysuria.  Musculoskeletal: Positive for back pain, falls, joint pain, myalgias and neck pain.  Skin: Negative for rash.  Neurological: Positive for dizziness.  Psychiatric/Behavioral: Positive for depression and memory loss.    Medications: Patient's Medications  New Prescriptions   No medications on file  Previous Medications   AMLODIPINE (NORVASC) 10 MG TABLET    Take 1 tablet (10 mg total) by mouth daily.   ASPIRIN 81 MG CHEWABLE TABLET    Chew 1 tablet (81 mg total) by  mouth daily.   CARVEDILOL (COREG) 3.125 MG TABLET    Take 1 tablet (3.125 mg total) by mouth 2 (two) times daily with a meal.   DOCUSATE SODIUM 100 MG CAPS    Take 100 mg by mouth 2 (two) times daily.   FEEDING SUPPLEMENT, ENSURE COMPLETE, (ENSURE COMPLETE) LIQD    Take 237 mLs by mouth 2 (two) times daily between meals.   GABAPENTIN (NEURONTIN) 300 MG CAPSULE    Take 300-600 mg by mouth See admin instructions. Take 300mg  in the morning, 300mg  at 12, and 2 tablets (600mg ) in the evening.   HYDROMORPHONE (DILAUDID) 4 MG TABLET    Take one tablet by mouth every 6 hours  as needed for pain   IBUPROFEN (ADVIL,MOTRIN) 200 MG TABLET    Take 200-400 mg by mouth 3 (three) times daily as needed for moderate pain.    LOSARTAN (COZAAR) 50 MG TABLET    TAKE 1 TABLET BY MOUTH TWICE DAILY   ROPINIROLE (REQUIP) 2 MG TABLET    Take 2 mg by mouth at bedtime. 1-2 tab bid prn  Modified Medications   No medications on file  Discontinued Medications   No medications on file    Physical Exam: Filed Vitals:   09/07/13 1757  BP: 105/61  Pulse: 74  Temp: 97 F (36.1 C)  Resp: 16  Height: 5' (1.524 m)  Weight: 111 lb (50.349 kg)   Physical Exam  Constitutional: No distress.  Thin white female  HENT:  Head: Normocephalic and atraumatic.  Cardiovascular: Normal rate, regular rhythm and normal heart sounds.   Pulmonary/Chest: Effort normal and breath sounds normal.  Abdominal: Soft. Bowel sounds are normal.  Musculoskeletal: She exhibits tenderness.  Of paravertebral muscle so neck, lumbar spine, SI joints also tender, hunched posture  Neurological: She is alert.  Skin: Skin is warm and dry.  Psychiatric: She has a normal mood and affect.     Labs reviewed: Basic Metabolic Panel:  Recent Labs  06/29/13 1750 08/26/13 0230 08/27/13 0515  NA 140 140 140  K 4.1 4.1 3.7  CL 100 102 102  CO2 28 26 25   GLUCOSE 91 105* 104*  BUN 29* 32* 19  CREATININE 0.77 0.80 0.80  CALCIUM 8.8 8.8 7.9*    Liver Function Tests:  Recent Labs  01/04/13 1258 08/26/13 0230  AST 18 18  ALT 13 10  ALKPHOS 92 70  BILITOT 0.6 0.6  PROT 6.3 6.0  ALBUMIN 3.7 3.4*    CBC:  Recent Labs  01/04/13 1258 05/06/13 1327 06/29/13 1750 08/26/13 0137 08/27/13 0515  WBC 6.7 7.9 7.3 10.2 8.5  NEUTROABS 4.5 6.6 5.6  --   --   HGB 9.9* 10.4* 9.9* 11.3* 10.3*  HCT 30.4* 31.9* 29.9* 33.9* 30.7*  MCV 86.6 89.4 89.8 89.4 89.5  PLT 344 252 200 277 207    Assessment/Plan 1. Primary osteoarthritis of right hip 2. Spinal stenosis, lumbar region, with neurogenic claudication 3.  Closed fracture of pubic ramus  on dilaudid 4mg  po q6 hrs, motrin 200-400mg  po tid prn, gabapentin 300mg  in the am, 300mg  at noon and 600mg  in the evening  for her pain and still having significant pain with therapy  Check bmp with all of that motrin use   4. Aortic stenosis -stable no changes  5. Pharyngeal swelling -felt to be due to her cervical spine osteophytes previously  6. RLS (restless legs syndrome) -stable with current therapy  7. BPPV (benign paroxysmal positional vertigo) -would benefit  from [place] for this  8. Hypertension -cont to monitor, goal is relatively high with her frailty and fall risk  Family/ staff Communication: discussed with pt, her nurse and DNS  Goals of care: short term rehab and return home  Labs/tests ordered:  Bmp next draw, f/u with Dr. Wynelle Link as was to be arranged at d/c from hospital and with Dr. Lucia Gaskins for CT neck due to neck mass

## 2013-09-08 ENCOUNTER — Other Ambulatory Visit: Payer: Self-pay | Admitting: Internal Medicine

## 2013-09-08 DIAGNOSIS — R221 Localized swelling, mass and lump, neck: Secondary | ICD-10-CM

## 2013-09-13 ENCOUNTER — Encounter: Payer: Self-pay | Admitting: Internal Medicine

## 2013-09-13 ENCOUNTER — Other Ambulatory Visit: Payer: Self-pay | Admitting: *Deleted

## 2013-09-13 ENCOUNTER — Non-Acute Institutional Stay: Payer: Medicare Other | Admitting: Internal Medicine

## 2013-09-13 DIAGNOSIS — S32599A Other specified fracture of unspecified pubis, initial encounter for closed fracture: Secondary | ICD-10-CM

## 2013-09-13 DIAGNOSIS — I10 Essential (primary) hypertension: Secondary | ICD-10-CM

## 2013-09-13 DIAGNOSIS — G2581 Restless legs syndrome: Secondary | ICD-10-CM

## 2013-09-13 DIAGNOSIS — G8929 Other chronic pain: Secondary | ICD-10-CM

## 2013-09-13 DIAGNOSIS — I350 Nonrheumatic aortic (valve) stenosis: Secondary | ICD-10-CM

## 2013-09-13 DIAGNOSIS — M25559 Pain in unspecified hip: Secondary | ICD-10-CM

## 2013-09-13 DIAGNOSIS — S32509A Unspecified fracture of unspecified pubis, initial encounter for closed fracture: Secondary | ICD-10-CM

## 2013-09-13 DIAGNOSIS — I359 Nonrheumatic aortic valve disorder, unspecified: Secondary | ICD-10-CM

## 2013-09-13 DIAGNOSIS — M25551 Pain in right hip: Secondary | ICD-10-CM

## 2013-09-13 DIAGNOSIS — S329XXA Fracture of unspecified parts of lumbosacral spine and pelvis, initial encounter for closed fracture: Secondary | ICD-10-CM

## 2013-09-13 DIAGNOSIS — M48062 Spinal stenosis, lumbar region with neurogenic claudication: Secondary | ICD-10-CM

## 2013-09-13 DIAGNOSIS — Z8673 Personal history of transient ischemic attack (TIA), and cerebral infarction without residual deficits: Secondary | ICD-10-CM

## 2013-09-13 MED ORDER — HYDROMORPHONE HCL 2 MG PO TABS: ORAL_TABLET | ORAL | Status: DC

## 2013-09-13 NOTE — Telephone Encounter (Signed)
[facility] 

## 2013-09-13 NOTE — Progress Notes (Signed)
MRN: [id number] Name: Shelly Martin  Sex: female Age: 80 y.o. DOB: [birth year]  [place] #: [facility] Facility/Room:  Level Of Care: SNF Provider: [name] Emergency Contacts: Extended Emergency Contact Information Primary Emergency Contact: [name] Address: [place], [place] [address] [place] Phone: [phone] Relation: Daughter  Code Status: FULL Allergies: Amoxicillin; Baclofen; Hctz; and Valium  Chief Complaint  Patient presents with  . Discharge Note    HPI: Patient is 80 y.o. female who was admitted for PT/OT after a pelvic fx who is now ready to be discharged to home.  Past Medical History  Diagnosis Date  . Hypertension   . Restless legs syndrome     on Requip  . Ulcer causing bleeding and hole in wall of stomach or small intestine   . Gastric ulcer     years ago  . Arthritis     Back, knees  . Constipation   . Urinary bladder calculus   . Urinary incontinence   . Bowel incontinence   . Aortic stenosis   . Stroke     Mini, no residual    Past Surgical History  Procedure Laterality Date  . Colon resection  50 years ago  . Tonsillectomy    . Uterine fibroid surgery    . Lumbar laminectomy/decompression microdiscectomy  06/25/2011    Procedure: LUMBAR LAMINECTOMY/DECOMPRESSION MICRODISCECTOMY;  Surgeon: Hosie Spangle, MD;  Location: [facility];  Service: Neurosurgery;  Laterality: Right;  RIGHT Lumbar Two-Three hemilaminectomy and microdiskectomy  . Vaginal hysterectomy        Medication List       This list is accurate as of: 09/13/13  4:17 PM.  Always use your most recent med list.               amLODipine 10 MG tablet  Commonly known as:  NORVASC  Take 1 tablet (10 mg total) by mouth daily.     aspirin 81 MG chewable tablet  Chew 1 tablet (81 mg total) by mouth daily.     carvedilol 3.125 MG tablet  Commonly known as:  COREG  Take 1 tablet (3.125 mg total) by mouth 2 (two)  times daily with a meal.     DSS 100 MG Caps  Take 100 mg by mouth 2 (two) times daily.     gabapentin 300 MG capsule  Commonly known as:  NEURONTIN  Take 300-600 mg by mouth See admin instructions. Take 300mg  in the morning, 300mg  at 12, and 2 tablets (600mg ) in the evening.     HYDROmorphone 2 MG tablet  Commonly known as:  DILAUDID  Take 2 mg by mouth every 6 (six) hours as needed for moderate pain or severe pain.     HYDROmorphone 4 MG tablet  Commonly known as:  DILAUDID  Take one tablet by mouth every 6 hours as needed for pain     HYDROmorphone 2 MG tablet  Commonly known as:  DILAUDID  Take one tablet by mouth every 6 hours as needed for moderate pain     ibuprofen 200 MG tablet  Commonly known as:  ADVIL,MOTRIN  Take 200-400 mg by mouth 3 (three) times daily as needed for moderate pain.     losartan 50 MG tablet  Commonly known as:  COZAAR  TAKE 1 TABLET BY MOUTH TWICE DAILY     rOPINIRole 2 MG tablet  Commonly known as:  REQUIP  Take 2 mg by mouth at bedtime. 1-2 tab bid prn        Meds ordered this encounter  Medications  . HYDROmorphone (DILAUDID) 2 MG tablet    Sig: Take 2 mg by mouth every 6 (six) hours as needed for moderate pain or severe pain.    Immunization History  Administered Date(s) Administered  . Influenza Split 04/15/2012  . Pneumococcal Polysaccharide-23 08/29/2013    History  Substance Use Topics  . Smoking status: Never Smoker   . Smokeless tobacco: Never Used  . Alcohol Use: Yes     Comment: rarely    Filed Vitals:   09/13/13 1404  BP: 120/76  Pulse: 74  Temp: 98 F (36.7 C)  Resp: 17    Physical Exam  GENERAL APPEARANCE: Alert, conversant. Appropriately groomed. No acute distress.  HEENT: Unremarkable. RESPIRATORY: Breathing is even, unlabored. Lung sounds are clear   CARDIOVASCULAR: Heart RRR no murmurs, rubs or gallops. No peripheral edema.  GASTROINTESTINAL: Abdomen is soft, non-tender, not distended w/ normal  bowel sounds.  NEUROLOGIC: Cranial nerves 2-12 grossly intact. Moves all extremities no tremor.  Patient Active Problem List   Diagnosis Date Noted  . Primary osteoarthritis of right hip 09/07/2013  . Spinal stenosis, lumbar region, with neurogenic claudication 09/07/2013  . Closed fracture of pubic ramus 09/07/2013  . BPPV (benign paroxysmal positional vertigo) 09/07/2013  . Pharyngeal swelling 08/30/2013  . Pelvic fracture 08/28/2013  . Fall 08/26/2013  . Dizziness 08/26/2013  . Abnormal stress test 08/26/2013  . Preop cardiovascular exam 08/01/2013  . Aortic stenosis 08/01/2013  . Edema 06/30/2013  . Loss of weight 01/04/2013  . Situational stress 01/04/2013  . Chronic right hip pain 07/05/2012  . Knee pain, acute 06/29/2012  . Vertigo 06/24/2012  . Acute exacerbation of chronic low back pain 06/24/2012  . Lumbar pain with radiation down right leg 06/09/2012  . DJD (degenerative joint disease) of hip 04/29/2012  . History of TIAs 04/17/2012  . S/P total hysterectomy and bilateral salpingo-oophorectomy 04/17/2012  . Hard of hearing 04/17/2012  . History of anemia 04/17/2012  . RLS (restless legs syndrome) 04/17/2012  . Chronic back pain 04/17/2012  . Spinal stenosis, lumbar 04/17/2012  . Lower back pain 12/20/2011  . Personal history of colonic polyps 10/08/2011  . H/O: CVA (cardiovascular accident) 09/14/2011  . Hypertension 09/14/2011  . Restless legs syndrome 09/14/2011  . Unspecified constipation 08/20/2011  . Urinary incontinence 08/20/2011    CBC    Component Value Date/Time   WBC 8.5 08/27/2013 0515   RBC 3.43* 08/27/2013 0515   HGB 10.3* 08/27/2013 0515   HCT 30.7* 08/27/2013 0515   PLT 207 08/27/2013 0515   MCV 89.5 08/27/2013 0515   LYMPHSABS 1.2 06/29/2013 1750   MONOABS 0.4 06/29/2013 1750   EOSABS 0.1 06/29/2013 1750   BASOSABS 0.0 06/29/2013 1750    CMP     Component Value Date/Time   NA 140 08/27/2013 0515   K 3.7 08/27/2013 0515   CL 102 08/27/2013 0515   CO2 25  08/27/2013 0515   GLUCOSE 104* 08/27/2013 0515   BUN 19 08/27/2013 0515   CREATININE 0.80 08/27/2013 0515   CREATININE 0.96 01/04/2013 1258   CALCIUM 7.9* 08/27/2013 0515   PROT 6.0 08/26/2013 0230   ALBUMIN 3.4* 08/26/2013 0230   AST 18 08/26/2013 0230   ALT 10 08/26/2013 0230   ALKPHOS 70 08/26/2013 0230   BILITOT 0.6 08/26/2013 0230   GFRNONAA 62* 08/27/2013 0515   GFRAA  71* 08/27/2013 0515    Assessment and Plan  Pt is improved and stable for discharge to home.  [name], MD

## 2013-09-14 ENCOUNTER — Ambulatory Visit
Admission: RE | Admit: 2013-09-14 | Discharge: 2013-09-14 | Disposition: A | Discharge status: Home / Self Care | Payer: Medicare Other | Source: Ambulatory Visit | Attending: Internal Medicine | Admitting: Internal Medicine

## 2013-09-14 DIAGNOSIS — R221 Localized swelling, mass and lump, neck: Secondary | ICD-10-CM

## 2013-09-14 MED ORDER — IOHEXOL 300 MG/ML  SOLN
75.0000 mL | Freq: Once | INTRAMUSCULAR | Status: AC | PRN
  Administered 2013-09-14: 75 mL via INTRAVENOUS

## 2013-09-21 DIAGNOSIS — I10 Essential (primary) hypertension: Secondary | ICD-10-CM

## 2013-09-21 DIAGNOSIS — M6281 Muscle weakness (generalized): Secondary | ICD-10-CM

## 2013-09-21 DIAGNOSIS — IMO0001 Reserved for inherently not codable concepts without codable children: Secondary | ICD-10-CM

## 2013-09-21 DIAGNOSIS — S329XXA Fracture of unspecified parts of lumbosacral spine and pelvis, initial encounter for closed fracture: Secondary | ICD-10-CM

## 2013-09-26 ENCOUNTER — Ambulatory Visit (INDEPENDENT_AMBULATORY_CARE_PROVIDER_SITE_OTHER): Payer: Medicare Other | Admitting: Internal Medicine

## 2013-09-26 ENCOUNTER — Encounter: Payer: Self-pay | Admitting: Internal Medicine

## 2013-09-26 VITALS — BP 92/51 | HR 52 | Temp 98.2°F | Resp 18 | Wt 116.0 lb

## 2013-09-26 DIAGNOSIS — M87051 Idiopathic aseptic necrosis of right femur: Secondary | ICD-10-CM

## 2013-09-26 DIAGNOSIS — M25519 Pain in unspecified shoulder: Secondary | ICD-10-CM

## 2013-09-26 DIAGNOSIS — I10 Essential (primary) hypertension: Secondary | ICD-10-CM

## 2013-09-26 DIAGNOSIS — M87059 Idiopathic aseptic necrosis of unspecified femur: Secondary | ICD-10-CM

## 2013-09-26 LAB — CBC WITH DIFFERENTIAL/PLATELET
BASOS PCT: 0 % (ref 0–1)
Basophils Absolute: 0 10*3/uL (ref 0.0–0.1)
EOS PCT: 2 % (ref 0–5)
Eosinophils Absolute: 0.2 10*3/uL (ref 0.0–0.7)
HCT: 32.6 % — ABNORMAL LOW (ref 36.0–46.0)
HEMOGLOBIN: 11.2 g/dL — AB (ref 12.0–15.0)
LYMPHS ABS: 2.1 10*3/uL (ref 0.7–4.0)
Lymphocytes Relative: 27 % (ref 12–46)
MCH: 29.1 pg (ref 26.0–34.0)
MCHC: 34.4 g/dL (ref 30.0–36.0)
MCV: 84.7 fL (ref 78.0–100.0)
MONOS PCT: 6 % (ref 3–12)
Monocytes Absolute: 0.5 10*3/uL (ref 0.1–1.0)
Neutro Abs: 4.9 10*3/uL (ref 1.7–7.7)
Neutrophils Relative %: 65 % (ref 43–77)
Platelets: 262 10*3/uL (ref 150–400)
RBC: 3.85 MIL/uL — AB (ref 3.87–5.11)
RDW: 13.7 % (ref 11.5–15.5)
WBC: 7.6 10*3/uL (ref 4.0–10.5)

## 2013-09-26 LAB — COMPREHENSIVE METABOLIC PANEL
ALK PHOS: 106 U/L (ref 39–117)
ALT: 9 U/L (ref 0–35)
AST: 15 U/L (ref 0–37)
Albumin: 3.9 g/dL (ref 3.5–5.2)
BILIRUBIN TOTAL: 0.6 mg/dL (ref 0.2–1.2)
BUN: 38 mg/dL — AB (ref 6–23)
CO2: 30 meq/L (ref 19–32)
CREATININE: 1.03 mg/dL (ref 0.50–1.10)
Calcium: 9.3 mg/dL (ref 8.4–10.5)
Chloride: 99 mEq/L (ref 96–112)
Glucose, Bld: 110 mg/dL — ABNORMAL HIGH (ref 70–99)
Potassium: 5.3 mEq/L (ref 3.5–5.3)
Sodium: 136 mEq/L (ref 135–145)
Total Protein: 6.2 g/dL (ref 6.0–8.3)

## 2013-09-26 LAB — TSH: TSH: 3.365 u[IU]/mL (ref 0.350–4.500)

## 2013-09-26 MED ORDER — ROPINIROLE HCL 2 MG PO TABS: 2.0000 mg | ORAL_TABLET | Freq: Every day | ORAL | Status: DC

## 2013-09-26 NOTE — Progress Notes (Signed)
Subjective:    Patient ID: Shelly Martin, female    DOB: [birth year], 80 y.o.   MRN: [id number]  HPI Shelly Martin is here for follow up of mulltiple issues  . She is with Jan her daughter who frequently rolls her eyes when pt speaks of issues  Shelly Martin recently discharged for hospital with pelvic fracture and avascular necrosis of her Right hip.  Fracture  sustained after a fall and was transferred for inpatient rehab at [facility]  Medication review:   HTN Pt was started on Norvasc, lospressor and Carvedilol in hospital in addition to her Losartan .  Her discharge papers from REhab have her  On Coreg, Norvasc and Losartan.  See BP  She has no dizziness  Chronic pain /spinal stenosis/  Advanced DJD of hip  Her Dilaudid has been increased to 4 mg   This is managed by Dr. Lovenia Shuck  Incidental abnormality of neck CT.  Follow up imaging found no primary neck mass  Moderate Aortic stenosis:  Recent work up with cardiology  EF 60%  On ASA    RLS  Well controlled by Requip  Chronic diarrhea:  She does see Dr. Deatra Ina for this   Increased Dilaudid may help .  I believe spinal stenosis is contributing to bowel control  Urinary incotinence  Again I believe spinal stenosis  Contributing to this  New issue:  Shelly Martin does report R shoulder pain since she has been home from rehab  Not sure if related  To initial fall.    She did not have any imaging for this.  She repeatedly states she does not want surgery.     Allergies  Allergen Reactions  . Amoxicillin Other (See Comments)    Reaction unknown  . Baclofen Other (See Comments)    "fuzzy in the eyes" and dizzy  . Hctz [Hydrochlorothiazide]     nausea  . Valium [Diazepam] Other (See Comments)    Pt became unresponsive and O2 sats dropped.   Past Medical History  Diagnosis Date  . Hypertension   . Restless legs syndrome     on Requip  . Ulcer causing bleeding and hole in wall of stomach or small intestine   . Gastric ulcer     years ago  . Arthritis    Back, knees  . Constipation   . Urinary bladder calculus   . Urinary incontinence   . Bowel incontinence   . Aortic stenosis   . Stroke     Mini, no residual   Past Surgical History  Procedure Laterality Date  . Colon resection  50 years ago  . Tonsillectomy    . Uterine fibroid surgery    . Lumbar laminectomy/decompression microdiscectomy  06/25/2011    Procedure: LUMBAR LAMINECTOMY/DECOMPRESSION MICRODISCECTOMY;  Surgeon: Hosie Spangle, MD;  Location: [facility];  Service: Neurosurgery;  Laterality: Right;  RIGHT Lumbar Two-Three hemilaminectomy and microdiskectomy  . Vaginal hysterectomy     History   Social History  . Marital Status: Widowed    Spouse Name: N/A    Number of Children: 2  . Years of Education: N/A   Occupational History  . Retired    Social History Main Topics  . Smoking status: Never Smoker   . Smokeless tobacco: Never Used  . Alcohol Use: Yes     Comment: rarely  . Drug Use: No  . Sexual Activity: Not Currently   Other Topics Concern  . Not on file   Social History Narrative  .  No narrative on file   Family History  Problem Relation Age of Onset  . Anesthesia problems Neg Hx   . Pancreatic cancer Brother   . Dementia Mother   . Stroke Father    Patient Active Problem List   Diagnosis Date Noted  . Primary osteoarthritis of right hip 09/07/2013  . Spinal stenosis, lumbar region, with neurogenic claudication 09/07/2013  . Closed fracture of pubic ramus 09/07/2013  . BPPV (benign paroxysmal positional vertigo) 09/07/2013  . Pharyngeal swelling 08/30/2013  . Pelvic fracture 08/28/2013  . Fall 08/26/2013  . Dizziness 08/26/2013  . Abnormal stress test 08/26/2013  . Preop cardiovascular exam 08/01/2013  . Aortic stenosis 08/01/2013  . Edema 06/30/2013  . Loss of weight 01/04/2013  . Situational stress 01/04/2013  . Chronic right hip pain 07/05/2012  . Knee pain, acute 06/29/2012  . Vertigo 06/24/2012  . Acute exacerbation of  chronic low back pain 06/24/2012  . Lumbar pain with radiation down right leg 06/09/2012  . DJD (degenerative joint disease) of hip 04/29/2012  . History of TIAs 04/17/2012  . S/P total hysterectomy and bilateral salpingo-oophorectomy 04/17/2012  . Hard of hearing 04/17/2012  . History of anemia 04/17/2012  . RLS (restless legs syndrome) 04/17/2012  . Chronic back pain 04/17/2012  . Spinal stenosis, lumbar 04/17/2012  . Lower back pain 12/20/2011  . Personal history of colonic polyps 10/08/2011  . H/O: CVA (cardiovascular accident) 09/14/2011  . Hypertension 09/14/2011  . Restless legs syndrome 09/14/2011  . Unspecified constipation 08/20/2011  . Urinary incontinence 08/20/2011   Current Outpatient Prescriptions on File Prior to Visit  Medication Sig Dispense Refill  . amLODipine (NORVASC) 10 MG tablet Take 1 tablet (10 mg total) by mouth daily.  30 tablet  0  . aspirin 81 MG chewable tablet Chew 1 tablet (81 mg total) by mouth daily.  30 tablet  0  . carvedilol (COREG) 3.125 MG tablet Take 1 tablet (3.125 mg total) by mouth 2 (two) times daily with a meal.  60 tablet  0  . docusate sodium 100 MG CAPS Take 100 mg by mouth 2 (two) times daily.  10 capsule  0  . gabapentin (NEURONTIN) 300 MG capsule Take 300-600 mg by mouth See admin instructions. Take 300mg  in the morning, 300mg  at 12, and 2 tablets (600mg ) in the evening.      Marland Kitchen HYDROmorphone (DILAUDID) 2 MG tablet Take 2 mg by mouth every 6 (six) hours as needed for moderate pain or severe pain.      Marland Kitchen HYDROmorphone (DILAUDID) 4 MG tablet Take one tablet by mouth every 6 hours as needed for pain  120 tablet  0  . ibuprofen (ADVIL,MOTRIN) 200 MG tablet Take 200-400 mg by mouth 3 (three) times daily as needed for moderate pain.       Marland Kitchen losartan (COZAAR) 50 MG tablet TAKE 1 TABLET BY MOUTH TWICE DAILY       No current facility-administered medications on file prior to visit.        Review of Systems See HPI    Objective:    Physical Exam Physical Exam  Nursing note and vitals reviewed.    Repeat BP  94/60  Constitutional: She is oriented to person, place, and time. She appears well-developed and well-nourished.  HENT:  Head: Normocephalic and atraumatic.  Cardiovascular: Normal rate and regular rhythm. Exam reveals no gallop and no friction rub.  No murmur heard.  Pulmonary/Chest: Breath sounds normal. She has no wheezes. She has  no rales.  Neurological: She is alert and oriented to person, place, and time.  Skin: Skin is warm and dry.  Psychiatric: She has a normal mood and affect. Her behavior is normal.         Assessment & Plan:   HTN:   With increased Dilaudid dose for pain,  I note that her SBP is in the 90's  To avoid any potential dizziness and falls I am going to  Stop her amlodipine as she has had issues with edema in the past and maintain her on Coreg 3/125 mg bid and Losartan 50 mg one daily.  Shes is to return to see me in 4-6 weeks so that I can closely follow her BP  R shoulder pain  Will get imaging today  And refer to Dr. Barbaraann Barthel for further management and possible steroid injection  AVN  R hip/ chronic pain/ spinal stenosis  Chronic pain management handled by Dr. Lovenia Shuck.  With worsening AS pt tells me she does not wish to proceed with surgical intervention right now  Mod AS  Mild ischemia  EF 60%  Follow with Cardiology  Chronic diarrhea/ urinary incontinence  She Sees both Dr. Deatra Ina and Dr.McDiarmid for this  See me in 4-6 weeks

## 2013-09-26 NOTE — Patient Instructions (Signed)
Make appt with Dr. Barbaraann Barthel  To xray and lab today

## 2013-09-27 ENCOUNTER — Telehealth: Payer: Self-pay | Admitting: *Deleted

## 2013-09-27 ENCOUNTER — Telehealth: Payer: Self-pay | Admitting: Internal Medicine

## 2013-09-27 DIAGNOSIS — M87051 Idiopathic aseptic necrosis of right femur: Secondary | ICD-10-CM | POA: Insufficient documentation

## 2013-09-27 HISTORY — DX: Idiopathic aseptic necrosis of right femur: M87.051

## 2013-09-27 LAB — VITAMIN D 25 HYDROXY (VIT D DEFICIENCY, FRACTURES): VIT D 25 HYDROXY: 39 ng/mL (ref 30–89)

## 2013-09-27 NOTE — Telephone Encounter (Signed)
[facility]  Call Jan and telll her that for Shelly Martin's blood pressure medication I have stopped the amlodipine (Norvasc).  She is to take her Losartan (Cozaar) 50 mg twice a day.  Also keep the Coreg  ( Carvedilol) 3.125 mg one tablet bid.    Where does she want me to send refills to?  Does she want 90 days  Tell Jan it is very important that I see Shelly Martin in 4-6 weeks so I can check her BP.  It was low here in office and I want to follow this closely  Give her 30 min appt in 4-6 weeks.    Route back to me with pharmacy

## 2013-09-29 NOTE — Telephone Encounter (Signed)
duplicate

## 2013-09-30 ENCOUNTER — Telehealth: Payer: Self-pay | Admitting: *Deleted

## 2013-09-30 NOTE — Telephone Encounter (Signed)
Pt calls stating that she is in the worse pain she has ever had. Pt states that she is taking medications as prescribed. Advised pt to use heat or ice therapy, as well as use her ibuprofen with the hydromorphone  3 times a day. If no relief to call her pain management for further instructions. Pt also states that ankles have been swelling with PT. Advised pt to keep feet elevated, and to report any SOB. Will call pt on Monday to re-check

## 2013-10-15 ENCOUNTER — Encounter (HOSPITAL_COMMUNITY): Payer: Self-pay | Admitting: Emergency Medicine

## 2013-10-15 ENCOUNTER — Emergency Department (HOSPITAL_COMMUNITY)
Admission: EM | Admit: 2013-10-15 | Discharge: 2013-10-15 | Disposition: A | Discharge status: Home / Self Care | Payer: Medicare Other | Attending: Emergency Medicine | Admitting: Emergency Medicine

## 2013-10-15 ENCOUNTER — Emergency Department (HOSPITAL_COMMUNITY): Payer: Medicare Other

## 2013-10-15 DIAGNOSIS — R079 Chest pain, unspecified: Secondary | ICD-10-CM | POA: Insufficient documentation

## 2013-10-15 DIAGNOSIS — Z880 Allergy status to penicillin: Secondary | ICD-10-CM | POA: Diagnosis not present

## 2013-10-15 DIAGNOSIS — G2581 Restless legs syndrome: Secondary | ICD-10-CM | POA: Insufficient documentation

## 2013-10-15 DIAGNOSIS — Z8719 Personal history of other diseases of the digestive system: Secondary | ICD-10-CM | POA: Insufficient documentation

## 2013-10-15 DIAGNOSIS — I10 Essential (primary) hypertension: Secondary | ICD-10-CM | POA: Insufficient documentation

## 2013-10-15 DIAGNOSIS — Z7982 Long term (current) use of aspirin: Secondary | ICD-10-CM | POA: Insufficient documentation

## 2013-10-15 DIAGNOSIS — M545 Low back pain, unspecified: Secondary | ICD-10-CM | POA: Diagnosis not present

## 2013-10-15 DIAGNOSIS — Z87448 Personal history of other diseases of urinary system: Secondary | ICD-10-CM | POA: Diagnosis not present

## 2013-10-15 DIAGNOSIS — L909 Atrophic disorder of skin, unspecified: Secondary | ICD-10-CM | POA: Diagnosis not present

## 2013-10-15 DIAGNOSIS — Z8673 Personal history of transient ischemic attack (TIA), and cerebral infarction without residual deficits: Secondary | ICD-10-CM | POA: Insufficient documentation

## 2013-10-15 DIAGNOSIS — Z79899 Other long term (current) drug therapy: Secondary | ICD-10-CM | POA: Diagnosis not present

## 2013-10-15 DIAGNOSIS — M129 Arthropathy, unspecified: Secondary | ICD-10-CM | POA: Diagnosis not present

## 2013-10-15 DIAGNOSIS — M549 Dorsalgia, unspecified: Secondary | ICD-10-CM

## 2013-10-15 DIAGNOSIS — L919 Hypertrophic disorder of the skin, unspecified: Secondary | ICD-10-CM

## 2013-10-15 LAB — BASIC METABOLIC PANEL
BUN: 33 mg/dL — ABNORMAL HIGH (ref 6–23)
CO2: 28 mEq/L (ref 19–32)
Calcium: 9 mg/dL (ref 8.4–10.5)
Chloride: 103 mEq/L (ref 96–112)
Creatinine, Ser: 0.92 mg/dL (ref 0.50–1.10)
GFR calc Af Amer: 60 mL/min — ABNORMAL LOW (ref >90)
GFR calc non Af Amer: 52 mL/min — ABNORMAL LOW (ref >90)
Glucose, Bld: 135 mg/dL — ABNORMAL HIGH (ref 70–99)
Potassium: 3.9 mEq/L (ref 3.7–5.3)
Sodium: 142 mEq/L (ref 137–147)

## 2013-10-15 LAB — CBC WITH DIFFERENTIAL/PLATELET
Basophils Absolute: 0 10*3/uL (ref 0.0–0.1)
Basophils Relative: 0 % (ref 0–1)
Eosinophils Absolute: 0.1 10*3/uL (ref 0.0–0.7)
Eosinophils Relative: 1 % (ref 0–5)
HCT: 34.6 % — ABNORMAL LOW (ref 36.0–46.0)
Hemoglobin: 11.4 g/dL — ABNORMAL LOW (ref 12.0–15.0)
Lymphocytes Relative: 22 % (ref 12–46)
Lymphs Abs: 1.5 10*3/uL (ref 0.7–4.0)
MCH: 29.4 pg (ref 26.0–34.0)
MCHC: 32.9 g/dL (ref 30.0–36.0)
MCV: 89.2 fL (ref 78.0–100.0)
Monocytes Absolute: 0.4 10*3/uL (ref 0.1–1.0)
Monocytes Relative: 6 % (ref 3–12)
Neutro Abs: 5 10*3/uL (ref 1.7–7.7)
Neutrophils Relative %: 71 % (ref 43–77)
Platelets: 211 10*3/uL (ref 150–400)
RBC: 3.88 MIL/uL (ref 3.87–5.11)
RDW: 13.4 % (ref 11.5–15.5)
WBC: 7 10*3/uL (ref 4.0–10.5)

## 2013-10-15 LAB — TROPONIN I: Troponin I: <0.3 ng/mL (ref <0.30)

## 2013-10-15 MED ORDER — HYDROMORPHONE HCL PF 1 MG/ML IJ SOLN
1.0000 mg | Freq: Once | INTRAMUSCULAR | Status: AC
  Administered 2013-10-15: 1 mg via INTRAMUSCULAR
  Filled 2013-10-15: qty 1

## 2013-10-15 MED ORDER — IBUPROFEN 200 MG PO TABS
200.0000 mg | ORAL_TABLET | Freq: Once | ORAL | Status: AC
  Administered 2013-10-15: 200 mg via ORAL
  Filled 2013-10-15: qty 1

## 2013-10-15 NOTE — ED Notes (Addendum)
Daughter was called and advised that pt was able to ambulate to and from her room with minimal assistance and would be able to ambulate as she normally does at home with her cane/walker.  Pt advised that she walked fine at home this morning and had no trouble with ambulation today.  Pt stated as long as she has her cane/walker she would be fine. EDP was advised and set patient for discharge.  Daughter stated she would arrive for pick up in 20 minutes

## 2013-10-15 NOTE — ED Provider Notes (Signed)
CSN: [id number]     Arrival date & time 10/15/13  1640 History   First MD Initiated Contact with Patient 10/15/13 1641     Chief Complaint  Patient presents with  . Chest Pain     (Consider location/radiation/quality/duration/timing/severity/associated sxs/prior Treatment) HPI  80yF with lower back pain. "Everyone keeps asking me about my chest, but I want to focus on the pain in my low back."  Worsening in last few days. Denies trauma. No acute numbness, tingling or loss of strength. Normally takes dilaudid, but currently out. Hx of urinary incontinence. Denies acute urinary symptoms. No fever or chills. This morning did have episode of pain under L breast. Sharp. Occurred while at rest. Currently resolved. Did not radiate. No sob. No diaphoresis or nausea.   Past Medical History  Diagnosis Date  . Hypertension   . Restless legs syndrome     on Requip  . Ulcer causing bleeding and hole in wall of stomach or small intestine   . Gastric ulcer     years ago  . Arthritis     Back, knees  . Constipation   . Urinary bladder calculus   . Urinary incontinence   . Bowel incontinence   . Aortic stenosis   . Stroke     Mini, no residual   Past Surgical History  Procedure Laterality Date  . Colon resection  50 years ago  . Tonsillectomy    . Uterine fibroid surgery    . Lumbar laminectomy/decompression microdiscectomy  06/25/2011    Procedure: LUMBAR LAMINECTOMY/DECOMPRESSION MICRODISCECTOMY;  Surgeon: Hosie Spangle, MD;  Location: [facility];  Service: Neurosurgery;  Laterality: Right;  RIGHT Lumbar Two-Three hemilaminectomy and microdiskectomy  . Vaginal hysterectomy     Family History  Problem Relation Age of Onset  . Anesthesia problems Neg Hx   . Pancreatic cancer Brother   . Dementia Mother   . Stroke Father    History  Substance Use Topics  . Smoking status: Never Smoker   . Smokeless tobacco: Never Used  . Alcohol Use: Yes     Comment: rarely   OB History   Grav Para Term Preterm Abortions TAB SAB Ect Mult Living                 Review of Systems  All systems reviewed and negative, other than as noted in HPI.   Allergies  Amoxicillin; Baclofen; Hctz; and Valium  Home Medications   Prior to Admission medications   Medication Sig Start Date End Date Taking? Authorizing Provider  aspirin 81 MG chewable tablet Chew 1 tablet (81 mg total) by mouth daily. 08/27/13   Thurnell Lose, MD  carvedilol (COREG) 3.125 MG tablet Take 1 tablet (3.125 mg total) by mouth 2 (two) times daily with a meal. 08/29/13   Nishant Dhungel, MD  docusate sodium 100 MG CAPS Take 100 mg by mouth 2 (two) times daily. 08/29/13   Nishant Dhungel, MD  gabapentin (NEURONTIN) 300 MG capsule Take 300-600 mg by mouth See admin instructions. Take 300mg  in the morning, 300mg  at 12, and 2 tablets (600mg ) in the evening. 06/14/13   Lanice Shirts, MD  HYDROmorphone (DILAUDID) 2 MG tablet Take 2 mg by mouth every 6 (six) hours as needed for moderate pain or severe pain.    Historical Provider, MD  HYDROmorphone (DILAUDID) 4 MG tablet Take one tablet by mouth every 6 hours as needed for pain 08/30/13   Blanchie Serve, MD  ibuprofen (ADVIL,MOTRIN) 200  MG tablet Take 200-400 mg by mouth 3 (three) times daily as needed for moderate pain.     Historical Provider, MD  losartan (COZAAR) 50 MG tablet TAKE 1 TABLET BY MOUTH TWICE DAILY    Lanice Shirts, MD  rOPINIRole (REQUIP) 2 MG tablet Take 1 tablet (2 mg total) by mouth at bedtime. 1-2 tab bid prn 09/26/13   Lanice Shirts, MD   BP 153/54  Resp 14  SpO2 100% Physical Exam  Nursing note and vitals reviewed. Constitutional: She appears well-developed and well-nourished. No distress.  HENT:  Head: Normocephalic and atraumatic.  Eyes: Conjunctivae are normal. Right eye exhibits no discharge. Left eye exhibits no discharge.  Neck: Neck supple.  Cardiovascular: Normal rate, regular rhythm and normal heart sounds.  Exam reveals  no gallop and no friction rub.   No murmur heard. Pulmonary/Chest: Effort normal and breath sounds normal. No respiratory distress. She exhibits no tenderness.  Abdominal: Soft. She exhibits no distension. There is no tenderness.  Musculoskeletal: She exhibits no edema and no tenderness.  Mild tenderness lower lumbar region, sacrum. No overlying skin changes. Small skin tag R upper buttock,  Neurological: She is alert.  Skin: Skin is warm and dry.  Psychiatric: She has a normal mood and affect. Her behavior is normal. Thought content normal.    ED Course  Procedures (including critical care time) Labs Review Labs Reviewed - No data to display  Imaging Review No results found.   EKG Interpretation None      MDM   Final diagnoses:  Back pain  Chest pain    80 year old female with atraumatic lower back/sacral pain. Workup is pretty unremarkable. Patient with recent pelvic rami fractures. She denies any acute trauma. Imaging negative for acute abnormality. Patient points to a "mass" on her sacrum. This actually her sacrum itself. She has little subcutaneous tissue in this area and when having her point to what she is feeling, she is pointing to and palpating her sacrum itself. She does have a small skin tag L upper buttock, but she says that's not what she's feeling.   Imaging reassuring. Pain improved with meds. Ambulated in ED and only needed person beside her to steady herself on occasion. Pt asking for prescription for dilaudid. Pt with chronic pain. FYI notes a contract. Discussed with daughter. Pt actually already has prescription but pharmacy will not fill yet because not yet time. Discussed that needs to speak with prescribing physician if she is running out of her pain medications early.   Virgel Manifold, MD 10/19/13 1049

## 2013-10-15 NOTE — Discharge Instructions (Signed)
Back Pain, Adult Low back pain is very common. About 1 in 5 people have back pain.The cause of low back pain is rarely dangerous. The pain often gets better over time.About half of people with a sudden onset of back pain feel better in just 2 weeks. About 8 in 10 people feel better by 6 weeks.  CAUSES Some common causes of back pain include:  Strain of the muscles or ligaments supporting the spine.  Wear and tear (degeneration) of the spinal discs.  Arthritis.  Direct injury to the back. DIAGNOSIS Most of the time, the direct cause of low back pain is not known.However, back pain can be treated effectively even when the exact cause of the pain is unknown.Answering your caregiver's questions about your overall health and symptoms is one of the most accurate ways to make sure the cause of your pain is not dangerous. If your caregiver needs more information, he or she may order lab work or imaging tests (X-rays or MRIs).However, even if imaging tests show changes in your back, this usually does not require surgery. HOME CARE INSTRUCTIONS For many people, back pain returns.Since low back pain is rarely dangerous, it is often a condition that people can learn to [facility] their own.   Remain active. It is stressful on the back to sit or stand in one place. Do not sit, drive, or stand in one place for more than 30 minutes at a time. Take short walks on level surfaces as soon as pain allows.Try to increase the length of time you walk each day.  Do not stay in bed.Resting more than 1 or 2 days can delay your recovery.  Do not avoid exercise or work.Your body is made to move.It is not dangerous to be active, even though your back may hurt.Your back will likely heal faster if you return to being active before your pain is gone.  Pay attention to your body when you bend and lift. Many people have less discomfortwhen lifting if they bend their knees, keep the load close to their bodies,and  avoid twisting. Often, the most comfortable positions are those that put less stress on your recovering back.  Find a comfortable position to sleep. Use a firm mattress and lie on your side with your knees slightly bent. If you lie on your back, put a pillow under your knees.  Only take over-the-counter or prescription medicines as directed by your caregiver. Over-the-counter medicines to reduce pain and inflammation are often the most helpful.Your caregiver may prescribe muscle relaxant drugs.These medicines help dull your pain so you can more quickly return to your normal activities and healthy exercise.  Put ice on the injured area.  Put ice in a plastic bag.  Place a towel between your skin and the bag.  Leave the ice on for 15-20 minutes, 03-04 times a day for the first 2 to 3 days. After that, ice and heat may be alternated to reduce pain and spasms.  Ask your caregiver about trying back exercises and gentle massage. This may be of some benefit.  Avoid feeling anxious or stressed.Stress increases muscle tension and can worsen back pain.It is important to recognize when you are anxious or stressed and learn ways to manage it.Exercise is a great option. SEEK MEDICAL CARE IF:  You have pain that is not relieved with rest or medicine.  You have pain that does not improve in 1 week.  You have new symptoms.  You are generally not feeling well. SEEK  medicine.   You have pain that does not improve in 1 week.   You have new symptoms.   You are generally not feeling well.  SEEK IMMEDIATE MEDICAL CARE IF:    You have pain that radiates from your back into your legs.   You develop new bowel or bladder control problems.   You have unusual weakness or numbness in your arms or legs.   You develop nausea or vomiting.   You develop abdominal pain.   You feel faint.  Document Released: 05/12/2005 Document Revised: 11/11/2011 Document Reviewed: 09/30/2010  ExitCare Patient Information 2014 [employer]  Chest Pain (Nonspecific)  It is often hard to give a specific diagnosis for the cause of chest pain. There is  always a chance that your pain could be related to something serious, such as a heart attack or a blood clot in the lungs. You need to follow up with your caregiver for further evaluation.  CAUSES    Heartburn.   Pneumonia or bronchitis.   Anxiety or stress.   Inflammation around your heart (pericarditis) or lung (pleuritis or pleurisy).   A blood clot in the lung.   A collapsed lung (pneumothorax). It can develop suddenly on its own (spontaneous pneumothorax) or from injury (trauma) to the chest.   Shingles infection (herpes zoster virus).  The chest wall is composed of bones, muscles, and cartilage. Any of these can be the source of the pain.   The bones can be bruised by injury.   The muscles or cartilage can be strained by coughing or overwork.   The cartilage can be affected by inflammation and become sore (costochondritis).  DIAGNOSIS   Lab tests or other studies, such as X-rays, electrocardiography, stress testing, or cardiac imaging, may be needed to find the cause of your pain.   TREATMENT    Treatment depends on what may be causing your chest pain. Treatment may include:   Acid blockers for heartburn.   Anti-inflammatory medicine.   Pain medicine for inflammatory conditions.   Antibiotics if an infection is present.   You may be advised to change lifestyle habits. This includes stopping smoking and avoiding alcohol, caffeine, and chocolate.   You may be advised to keep your head raised (elevated) when sleeping. This reduces the chance of acid going backward from your stomach into your esophagus.   Most of the time, nonspecific chest pain will improve within 2 to 3 days with rest and mild pain medicine.  HOME CARE INSTRUCTIONS    If antibiotics were prescribed, take your antibiotics as directed. Finish them even if you start to feel better.   For the next few days, avoid physical activities that bring on chest pain. Continue physical activities as directed.   Do not smoke.   Avoid  drinking alcohol.   Only take over-the-counter or prescription medicine for pain, discomfort, or fever as directed by your caregiver.   Follow your caregiver's suggestions for further testing if your chest pain does not go away.   Keep any follow-up appointments you made. If you do not go to an appointment, you could develop lasting (chronic) problems with pain. If there is any problem keeping an appointment, you must call to reschedule.  SEEK MEDICAL CARE IF:    You think you are having problems from the medicine you are taking. Read your medicine instructions carefully.   Your chest pain does not go away, even after treatment.   You develop a rash with blisters on   your chest.  SEEK IMMEDIATE MEDICAL CARE IF:    You have increased chest pain or pain that spreads to your arm, neck, jaw, back, or abdomen.   You develop shortness of breath, an increasing cough, or you are coughing up blood.   You have severe back or abdominal pain, feel nauseous, or vomit.   You develop severe weakness, fainting, or chills.   You have a fever.  THIS IS AN EMERGENCY. Do not wait to see if the pain will go away. Get medical help at once. Call your local emergency services (911 in [place] Do not drive yourself to the hospital.  MAKE SURE YOU:    Understand these instructions.   Will watch your condition.   Will get help right away if you are not doing well or get worse.  Document Released: 02/19/2005 Document Revised: 08/04/2011 Document Reviewed: 12/16/2007  Exit[employer] Patient Information 2014 [employer].

## 2013-10-15 NOTE — ED Notes (Signed)
Called EMS for chest pain starting this morning at 0900 under left breast. Took 324mg  ASA before EMS arrival. Chest pain resolved on arrival to ED. Reports tailbone pain at this time.

## 2013-10-18 ENCOUNTER — Telehealth: Payer: Self-pay | Admitting: *Deleted

## 2013-10-18 NOTE — Telephone Encounter (Signed)
Pt states that she is out of her medication dilaudid and can not get a refill until next week. She was seen in the ED for pain control but it only last about 12 hours. Pt wants to know is there anything else you can do for her pain. Pt states that she has has lost all control of her bowels.

## 2013-10-18 NOTE — Telephone Encounter (Signed)
LVM message to return phone call

## 2013-10-19 NOTE — Telephone Encounter (Signed)
Pt needs to get any refills from her pain management MD

## 2013-11-10 ENCOUNTER — Telehealth: Payer: Self-pay | Admitting: *Deleted

## 2013-11-10 NOTE — Telephone Encounter (Signed)
Charnice called she is still in immense pain and is not doing well. She would like to talk to someone.  She is also having tingling in her hands which is a new symptom.

## 2013-11-10 NOTE — Telephone Encounter (Signed)
I called pt to go over chief complaint but pt states she did not call office. She asked her daughter if her daughter called office and daughter states she did not call office. Pt states no one called but will call back if she needs [place].

## 2013-11-22 ENCOUNTER — Telehealth: Payer: Self-pay | Admitting: *Deleted

## 2013-11-22 NOTE — Telephone Encounter (Signed)
[name] called. She would like an nurse to call her about her pain.  She says her pain meds are not working. She wants to know what else can be done.

## 2013-11-22 NOTE — Telephone Encounter (Signed)
Called pt about message she left stating pain - daughter answered phone and states pt is not available. Daughter wanted to know why I was calling. I explained pt had called and left a message that she wanted nurse to rtn call. Daughter states pt has same complaints as before and that she would take care of her mother. I asked again to speak to pt as she was the one that called. Daughter stated again that pt was not available and that she would " handle it".

## 2013-11-28 ENCOUNTER — Telehealth: Payer: Self-pay | Admitting: *Deleted

## 2013-11-28 ENCOUNTER — Emergency Department (HOSPITAL_COMMUNITY)
Admission: EM | Admit: 2013-11-28 | Discharge: 2013-11-28 | Disposition: A | Discharge status: Home / Self Care | Payer: Medicare Other | Attending: Emergency Medicine | Admitting: Emergency Medicine

## 2013-11-28 ENCOUNTER — Encounter (HOSPITAL_COMMUNITY): Payer: Self-pay | Admitting: Emergency Medicine

## 2013-11-28 DIAGNOSIS — Z87442 Personal history of urinary calculi: Secondary | ICD-10-CM | POA: Insufficient documentation

## 2013-11-28 DIAGNOSIS — K5900 Constipation, unspecified: Secondary | ICD-10-CM | POA: Insufficient documentation

## 2013-11-28 DIAGNOSIS — G2581 Restless legs syndrome: Secondary | ICD-10-CM | POA: Insufficient documentation

## 2013-11-28 DIAGNOSIS — R011 Cardiac murmur, unspecified: Secondary | ICD-10-CM | POA: Insufficient documentation

## 2013-11-28 DIAGNOSIS — M25519 Pain in unspecified shoulder: Secondary | ICD-10-CM | POA: Insufficient documentation

## 2013-11-28 DIAGNOSIS — M479 Spondylosis, unspecified: Secondary | ICD-10-CM | POA: Insufficient documentation

## 2013-11-28 DIAGNOSIS — I10 Essential (primary) hypertension: Secondary | ICD-10-CM | POA: Insufficient documentation

## 2013-11-28 DIAGNOSIS — Z87448 Personal history of other diseases of urinary system: Secondary | ICD-10-CM | POA: Insufficient documentation

## 2013-11-28 DIAGNOSIS — M549 Dorsalgia, unspecified: Secondary | ICD-10-CM | POA: Insufficient documentation

## 2013-11-28 DIAGNOSIS — M25559 Pain in unspecified hip: Secondary | ICD-10-CM | POA: Insufficient documentation

## 2013-11-28 DIAGNOSIS — G8929 Other chronic pain: Secondary | ICD-10-CM | POA: Insufficient documentation

## 2013-11-28 DIAGNOSIS — M25511 Pain in right shoulder: Secondary | ICD-10-CM

## 2013-11-28 DIAGNOSIS — Z8673 Personal history of transient ischemic attack (TIA), and cerebral infarction without residual deficits: Secondary | ICD-10-CM | POA: Insufficient documentation

## 2013-11-28 DIAGNOSIS — IMO0002 Reserved for concepts with insufficient information to code with codable children: Secondary | ICD-10-CM

## 2013-11-28 DIAGNOSIS — M1710 Unilateral primary osteoarthritis, unspecified knee: Secondary | ICD-10-CM | POA: Insufficient documentation

## 2013-11-28 DIAGNOSIS — Z79899 Other long term (current) drug therapy: Secondary | ICD-10-CM | POA: Insufficient documentation

## 2013-11-28 DIAGNOSIS — Z880 Allergy status to penicillin: Secondary | ICD-10-CM | POA: Insufficient documentation

## 2013-11-28 DIAGNOSIS — Z8719 Personal history of other diseases of the digestive system: Secondary | ICD-10-CM | POA: Insufficient documentation

## 2013-11-28 MED ORDER — HYDROMORPHONE HCL PF 1 MG/ML IJ SOLN
1.0000 mg | Freq: Once | INTRAMUSCULAR | Status: AC
  Administered 2013-11-28: 1 mg via INTRAMUSCULAR
  Filled 2013-11-28: qty 1

## 2013-11-28 NOTE — ED Notes (Signed)
Pt reports back painand right shoulder pain. Pt sts usually she just needs "one shot of dilaudid" to help with pain. Pt denies other issues.

## 2013-11-28 NOTE — ED Provider Notes (Signed)
CSN: [id number]     Arrival date & time 11/28/13  1609 History   First MD Initiated Contact with Patient 11/28/13 1703     Chief Complaint  Patient presents with  . Back Pain  . Shoulder Pain     (Consider location/radiation/quality/duration/timing/severity/associated sxs/prior Treatment) HPI 80 year old female with chronic severe pain to her back right hip and right shoulder takes Dilaudid at home states Dilaudid is no longer helping her shoulder pain which is constant and positional present 24 hours a day for several weeks without associated weakness or numbness or swelling or color change to her arm and without change in her baseline severe back pain and hip pain returns now well controlled with her daughter at home anyway, she denies fever chest pain cough shortness breath abdominal pain change in bowel or bladder function weakness or numbness or other concerns. At baseline she has severe uncontrolled back pain right hip pain and now for several weeks right shoulder pain which is positional and worse with palpation and movement but nonexertional as well. She states she has to come to the emergency department for shots of Dilaudid because that works better than her Dilaudid pills do at home.She was last in the ED for a shot of Dilaudid about 6 weeks ago.  Past Medical History  Diagnosis Date  . Hypertension   . Restless legs syndrome     on Requip  . Ulcer causing bleeding and hole in wall of stomach or small intestine   . Gastric ulcer     years ago  . Arthritis     Back, knees  . Constipation   . Urinary bladder calculus   . Urinary incontinence   . Bowel incontinence   . Aortic stenosis   . Stroke     Mini, no residual   Past Surgical History  Procedure Laterality Date  . Colon resection  50 years ago  . Tonsillectomy    . Uterine fibroid surgery    . Lumbar laminectomy/decompression microdiscectomy  06/25/2011    Procedure: LUMBAR LAMINECTOMY/DECOMPRESSION MICRODISCECTOMY;   Surgeon: Hosie Spangle, MD;  Location: [facility];  Service: Neurosurgery;  Laterality: Right;  RIGHT Lumbar Two-Three hemilaminectomy and microdiskectomy  . Vaginal hysterectomy     Family History  Problem Relation Age of Onset  . Anesthesia problems Neg Hx   . Pancreatic cancer Brother   . Dementia Mother   . Stroke Father    History  Substance Use Topics  . Smoking status: Never Smoker   . Smokeless tobacco: Never Used  . Alcohol Use: Yes     Comment: rarely   OB History   Grav Para Term Preterm Abortions TAB SAB Ect Mult Living                 Review of Systems 10 Systems reviewed and are negative for acute change except as noted in the HPI.   Allergies  Amoxicillin; Baclofen; Hctz; and Valium  Home Medications   Prior to Admission medications   Medication Sig Start Date End Date Taking? Authorizing Provider  amLODipine (NORVASC) 10 MG tablet Take 10 mg by mouth daily.   Yes Historical Provider, MD  carvedilol (COREG) 3.125 MG tablet Take 1 tablet (3.125 mg total) by mouth 2 (two) times daily with a meal. 08/29/13  Yes Nishant Dhungel, MD  gabapentin (NEURONTIN) 300 MG capsule Take 300-600 mg by mouth See admin instructions. Take 300mg  in the morning, 300mg  at 12, and 2 tablets (600mg ) in the  evening. 06/14/13  Yes Lanice Shirts, MD  HYDROmorphone (DILAUDID) 4 MG tablet Take one tablet by mouth every 6 hours as needed for pain 08/30/13  Yes Mahima Pandey, MD  ibuprofen (ADVIL,MOTRIN) 200 MG tablet Take 200-400 mg by mouth 3 (three) times daily as needed for moderate pain.    Yes Historical Provider, MD  losartan (COZAAR) 50 MG tablet Take 50 mg by mouth daily.   Yes Historical Provider, MD  polyethylene glycol (MIRALAX / GLYCOLAX) packet Take 17 g by mouth as needed for mild constipation.  09/24/13  Yes Historical Provider, MD  rOPINIRole (REQUIP) 2 MG tablet Take 2 mg by mouth at bedtime.   Yes Historical Provider, MD   BP 133/56  Pulse 53  Temp(Src) 98.2 F  (36.8 C)  Resp 16  SpO2 96% Physical Exam  Nursing note and vitals reviewed. Constitutional:  Awake, alert, nontoxic appearance.  HENT:  Head: Atraumatic.  Eyes: Right eye exhibits no discharge. Left eye exhibits no discharge.  Neck: Neck supple.  Cardiovascular: Normal rate and regular rhythm.   Murmur heard. Pulmonary/Chest: Effort normal and breath sounds normal. No respiratory distress. She has no wheezes. She has no rales. She exhibits no tenderness.  Abdominal: Soft. There is no tenderness. There is no rebound.  Musculoskeletal: She exhibits tenderness.  Baseline ROM, no obvious new focal weakness. Baseline tenderness to the patient's back and right hip patient's right shoulder is diffusely tender but has negative drop test no swelling or deformity noted to the right arm the right arm is radial pulse intact capillary refill less than 2 seconds normal light touch was intact 5 out of 5 strength with sensation and strength intact in the distributions of the axillary median radial and ulnar nerve function.  Neurological:  Mental status and motor strength appears baseline for patient and situation.  Skin: No rash noted.  Psychiatric: She has a normal mood and affect.    ED Course  Procedures (including critical care time) Patient informed of clinical course, understand medical decision-making process, and agree with plan. Labs Review Labs Reviewed - No data to display  Imaging Review No results found.   EKG Interpretation None      MDM   Final diagnoses:  Right shoulder pain  Chronic pain    I doubt any other EMC precluding discharge at this time including, but not necessarily limited to the following:CVA, ACS.    Babette Relic, MD 11/29/13 [phone]

## 2013-11-28 NOTE — Progress Notes (Signed)
  CARE MANAGEMENT ED NOTE 11/28/2013  Patient:  Shelly Martin, Shelly Martin   Account Number:  [id number]  Date Initiated:  11/28/2013  Documentation initiated by:  Livia Snellen  Subjective/Objective Assessment:   Patient presents to Ed with right arm, right shoulder pain, N/V x1     Subjective/Objective Assessment Detail:     Action/Plan:   Action/Plan Detail:   Anticipated DC Date:       Status Recommendation to Physician:   Result of Recommendation:    Other ED Services  Consult Working [place]  Other    Choice offered to / List presented to:  C-4 Adult Children          Status of service:  Completed, signed off  ED Comments:   ED Comments Detail:  EDCM spoke to patient and her son Shelly Martin at bedside. Patient reports she lives at home with her daughter. Patient reports her daughter is out of town but is coming home tonight.  Patient's son has been staying with her. Patient reports she has been receiving physical therapy at home, "But it just finished so I have to wait a month to call them to come back."  Patient does not know which home health agency comes to her home for PT, but the patient is very happy with them.  Patient reports she is able to perform her ADL's on her own.  Patient reports having a family member with her when she takes a shower.  Patient has walker, cane, shower chair and bedside commode at home. EDCM caled [facility] who confirmed patient was active with them in 2013.  EDCM called [place] who confirmed patient was active with them in 2014.  EDCM provided patient with a list of  home health agencies in [facility] and a printed list of private duty nursing agencies.  EDCM infomed patient that with home health services she may receive a visiting RN, PT, OT, aide and social worker if needed.  EDCM also informed patient that private duty nursing services would be an out of pocket expense.  EDCM encouraged patient to find out who her home health  agency is (since she is happy with them), and ask her pcp to place further home health orders into them. Patient and patient's osn agreeable to this plan.  no further EDCM needs at this time.

## 2013-11-28 NOTE — Discharge Instructions (Signed)
Return sooner to the emergency department if you develop new weakness or numbness fever or other concerns.  Chronic Pain Discharge Instructions  Emergency care providers appreciate that many patients coming to [place] are in severe pain and we wish to address their pain in the safest, most responsible manner.  It is important to recognize however, that the proper treatment of chronic pain differs from that of the pain of injuries and acute illnesses.  Our goal is to provide quality, safe, personalized care and we thank you for giving [place] the opportunity to serve you. The use of narcotics and related agents for chronic pain syndromes may lead to additional physical and psychological problems.  Nearly as many people die from prescription narcotics each year as die from car crashes.  Additionally, this risk is increased if such prescriptions are obtained from a variety of sources.  Therefore, only your primary care physician or a pain management specialist is able to safely treat such syndromes with narcotic medications long-term.    Documentation revealing such prescriptions have been sought from multiple sources may prohibit [place] from providing a refill or different narcotic medication.  Your name may be checked first through the [place].  This database is a record of controlled substance medication prescriptions that the patient has received.  This has been established by [facility] in an effort to eliminate the dangerous, and often life threatening, practice of obtaining multiple prescriptions from different medical providers.   If you have a chronic pain syndrome (i.e. chronic headaches, recurrent back or neck pain, dental pain, abdominal or pelvis pain without a specific diagnosis, or neuropathic pain such as fibromyalgia) or recurrent visits for the same condition without an acute diagnosis, you may be treated with non-narcotics and other non-addictive medicines.   Allergic reactions or negative side effects that may be reported by a patient to such medications will not typically lead to the use of a narcotic analgesic or other controlled substance as an alternative.   Patients managing chronic pain with a personal physician should have provisions in place for breakthrough pain.  If you are in crisis, you should call your physician.  If your physician directs you to the emergency department, please have the doctor call and speak to our attending physician concerning your care.   When patients come to the Emergency Department (ED) with acute medical conditions in which the Emergency Department physician feels appropriate to prescribe narcotic or sedating pain medication, the physician will prescribe these in very limited quantities.  The amount of these medications will last only until you can see your primary care physician in his/her office.  Any patient who returns to the ED seeking refills should expect only non-narcotic pain medications.   In the event of an acute medical condition exists and the emergency physician feels it is necessary that the patient be given a narcotic or sedating medication -  a responsible adult driver should be present in the room prior to the medication being given by the nurse.   Prescriptions for narcotic or sedating medications that have been lost, stolen or expired will not be refilled in the Emergency Department.    Patients who have chronic pain may receive non-narcotic prescriptions until seen by their primary care physician.  It is every patients personal responsibility to maintain active prescriptions with his or her primary care physician or specialist.

## 2013-11-28 NOTE — Telephone Encounter (Signed)
[name] called and would like Dr or nurse to call her.  She is having extreme pain.  She is also nauseated and vomiting this morning.

## 2013-11-28 NOTE — Telephone Encounter (Signed)
Called pt she adv lower back and right arm has "extreme pain" along with shoulder. Pt states she is having "more pain than normal". Pt states she has been nauseated and vomiting and feels that "something is not right". Pt states her daughter is out of town and son is there with her. Son will take her to ER for eval. Pt expressed understanding.

## 2013-11-30 ENCOUNTER — Telehealth: Payer: Self-pay | Admitting: *Deleted

## 2013-11-30 NOTE — Telephone Encounter (Signed)
Shelly Martin called she went to ER earlier this week, and they gave her Diladid for pain which seemed to help some.  She is now back at home and in lots of pain again. She wants you to call her to make a plan for her pain.

## 2013-12-01 NOTE — Telephone Encounter (Signed)
Called pt back, daughter answered and asked who I was. I told her I was returning a call pt had placed to Dr. Sharin Mons office. Daughter stated pt was asleep and had called because pt was having a lot of pain. Dilaudid is not helping and pt c/o nausea. I explained that since pt had already gotten pain meds then she would need to contact her pain mgmt dr about the pain she was having. Daughter expressed understanding and will have pt call pain doctor.

## 2013-12-26 ENCOUNTER — Other Ambulatory Visit: Payer: Self-pay | Admitting: Internal Medicine

## 2013-12-26 NOTE — Telephone Encounter (Signed)
Requested Medications     Medication name:  Name from pharmacy:  rOPINIRole (REQUIP) 2 MG tablet  ROPINIROLE HCL 2 MG TABLET    The source prescription has been discontinued.    Sig: TAKE 1-2 TABLETS TWICE DAILY AS NEEDED (RESTLESS LEG).    Dispense: 180 tablet Refills: 1 Start: 12/26/2013  Class: Normal    Requested on: 06/17/2013    Originally ordered on: 05/06/2013 Last refill: 09/26/2013 Order History and Details

## 2014-02-28 ENCOUNTER — Ambulatory Visit: Payer: Medicare Other | Admitting: Internal Medicine

## 2014-02-28 ENCOUNTER — Telehealth: Payer: Self-pay | Admitting: *Deleted

## 2014-02-28 NOTE — Telephone Encounter (Signed)
Error

## 2014-03-02 ENCOUNTER — Emergency Department (HOSPITAL_COMMUNITY): Payer: Medicare Other

## 2014-03-02 ENCOUNTER — Emergency Department (HOSPITAL_COMMUNITY)
Admission: EM | Admit: 2014-03-02 | Discharge: 2014-03-02 | Disposition: A | Discharge status: Home / Self Care | Payer: Medicare Other | Attending: Emergency Medicine | Admitting: Emergency Medicine

## 2014-03-02 ENCOUNTER — Encounter (HOSPITAL_COMMUNITY): Payer: Self-pay | Admitting: Emergency Medicine

## 2014-03-02 DIAGNOSIS — M1990 Unspecified osteoarthritis, unspecified site: Secondary | ICD-10-CM | POA: Diagnosis not present

## 2014-03-02 DIAGNOSIS — Z87442 Personal history of urinary calculi: Secondary | ICD-10-CM | POA: Diagnosis not present

## 2014-03-02 DIAGNOSIS — K5900 Constipation, unspecified: Secondary | ICD-10-CM | POA: Diagnosis not present

## 2014-03-02 DIAGNOSIS — G2581 Restless legs syndrome: Secondary | ICD-10-CM | POA: Insufficient documentation

## 2014-03-02 DIAGNOSIS — Z79899 Other long term (current) drug therapy: Secondary | ICD-10-CM | POA: Insufficient documentation

## 2014-03-02 DIAGNOSIS — I10 Essential (primary) hypertension: Secondary | ICD-10-CM | POA: Insufficient documentation

## 2014-03-02 DIAGNOSIS — M7121 Synovial cyst of popliteal space [Baker], right knee: Secondary | ICD-10-CM | POA: Diagnosis not present

## 2014-03-02 DIAGNOSIS — R609 Edema, unspecified: Secondary | ICD-10-CM

## 2014-03-02 DIAGNOSIS — Z8673 Personal history of transient ischemic attack (TIA), and cerebral infarction without residual deficits: Secondary | ICD-10-CM | POA: Insufficient documentation

## 2014-03-02 DIAGNOSIS — M7989 Other specified soft tissue disorders: Secondary | ICD-10-CM | POA: Diagnosis present

## 2014-03-02 DIAGNOSIS — Z880 Allergy status to penicillin: Secondary | ICD-10-CM | POA: Diagnosis not present

## 2014-03-02 DIAGNOSIS — M25451 Effusion, right hip: Secondary | ICD-10-CM | POA: Diagnosis present

## 2014-03-02 DIAGNOSIS — M7120 Synovial cyst of popliteal space [Baker], unspecified knee: Secondary | ICD-10-CM

## 2014-03-02 LAB — CBC WITH DIFFERENTIAL/PLATELET
BASOS ABS: 0 10*3/uL (ref 0.0–0.1)
Basophils Relative: 0 % (ref 0–1)
EOS PCT: 1 % (ref 0–5)
Eosinophils Absolute: 0.1 10*3/uL (ref 0.0–0.7)
HCT: 32.8 % — ABNORMAL LOW (ref 36.0–46.0)
Hemoglobin: 10.4 g/dL — ABNORMAL LOW (ref 12.0–15.0)
Lymphocytes Relative: 21 % (ref 12–46)
Lymphs Abs: 1.4 10*3/uL (ref 0.7–4.0)
MCH: 28.8 pg (ref 26.0–34.0)
MCHC: 31.7 g/dL (ref 30.0–36.0)
MCV: 90.9 fL (ref 78.0–100.0)
Monocytes Absolute: 0.3 10*3/uL (ref 0.1–1.0)
Monocytes Relative: 5 % (ref 3–12)
Neutro Abs: 4.6 10*3/uL (ref 1.7–7.7)
Neutrophils Relative %: 73 % (ref 43–77)
PLATELETS: 228 10*3/uL (ref 150–400)
RBC: 3.61 MIL/uL — ABNORMAL LOW (ref 3.87–5.11)
RDW: 14.1 % (ref 11.5–15.5)
WBC: 6.4 10*3/uL (ref 4.0–10.5)

## 2014-03-02 LAB — COMPREHENSIVE METABOLIC PANEL
ALT: 14 U/L (ref 0–35)
AST: 19 U/L (ref 0–37)
Albumin: 3.1 g/dL — ABNORMAL LOW (ref 3.5–5.2)
Alkaline Phosphatase: 78 U/L (ref 39–117)
Anion gap: 10 (ref 5–15)
BUN: 35 mg/dL — ABNORMAL HIGH (ref 6–23)
CALCIUM: 8.4 mg/dL (ref 8.4–10.5)
CO2: 30 mEq/L (ref 19–32)
Chloride: 99 mEq/L (ref 96–112)
Creatinine, Ser: 0.98 mg/dL (ref 0.50–1.10)
GFR calc Af Amer: 56 mL/min — ABNORMAL LOW (ref >90)
GFR calc non Af Amer: 48 mL/min — ABNORMAL LOW (ref >90)
Glucose, Bld: 216 mg/dL — ABNORMAL HIGH (ref 70–99)
Potassium: 4.4 mEq/L (ref 3.7–5.3)
SODIUM: 139 meq/L (ref 137–147)
Total Bilirubin: 0.4 mg/dL (ref 0.3–1.2)
Total Protein: 5.8 g/dL — ABNORMAL LOW (ref 6.0–8.3)

## 2014-03-02 LAB — D-DIMER, QUANTITATIVE (NOT AT ARMC): D DIMER QUANT: 1.42 ug{FEU}/mL — AB (ref 0.00–0.48)

## 2014-03-02 LAB — PRO B NATRIURETIC PEPTIDE: Pro B Natriuretic peptide (BNP): 528.5 pg/mL — ABNORMAL HIGH (ref 0–450)

## 2014-03-02 MED ORDER — HYDROMORPHONE HCL 2 MG PO TABS
4.0000 mg | ORAL_TABLET | Freq: Four times a day (QID) | ORAL | Status: DC | PRN
  Administered 2014-03-02: 4 mg via ORAL
  Filled 2014-03-02: qty 2

## 2014-03-02 MED ORDER — FUROSEMIDE 20 MG PO TABS: 20.0000 mg | ORAL_TABLET | Freq: Every day | ORAL | Status: DC

## 2014-03-02 NOTE — ED Provider Notes (Signed)
CSN: [id number]     Arrival date & time 03/02/14  1400 History   First MD Initiated Contact with Patient 03/02/14 1505     Chief Complaint  Patient presents with  . Joint Swelling   HPI Pt started having trouble with swelling in her legs one week ago.  The symptoms have been gradually getting worse but this am it was much worse.  She called her doctor who told her to come to the ED.  No pain in her legs.  No fever.  No chest pain or shortness of breath.  She denies having had this trouble before although she has taken HCTZ according to her daughter but it did not help. Past Medical History  Diagnosis Date  . Hypertension   . Restless legs syndrome     on Requip  . Ulcer causing bleeding and hole in wall of stomach or small intestine   . Gastric ulcer     years ago  . Arthritis     Back, knees  . Constipation   . Urinary bladder calculus   . Urinary incontinence   . Bowel incontinence   . Aortic stenosis   . Stroke     Mini, no residual   Past Surgical History  Procedure Laterality Date  . Colon resection  50 years ago  . Tonsillectomy    . Uterine fibroid surgery    . Lumbar laminectomy/decompression microdiscectomy  06/25/2011    Procedure: LUMBAR LAMINECTOMY/DECOMPRESSION MICRODISCECTOMY;  Surgeon: Hosie Spangle, MD;  Location: [facility];  Service: Neurosurgery;  Laterality: Right;  RIGHT Lumbar Two-Three hemilaminectomy and microdiskectomy  . Vaginal hysterectomy     Family History  Problem Relation Age of Onset  . Anesthesia problems Neg Hx   . Pancreatic cancer Brother   . Dementia Mother   . Stroke Father    History  Substance Use Topics  . Smoking status: Never Smoker   . Smokeless tobacco: Never Used  . Alcohol Use: Yes     Comment: rarely   OB History   Grav Para Term Preterm Abortions TAB SAB Ect Mult Living                 Review of Systems  All other systems reviewed and are negative.     Allergies  Amoxicillin; Baclofen; Hctz; and  Valium  Home Medications   Prior to Admission medications   Medication Sig Start Date End Date Taking? Authorizing Provider  amLODipine (NORVASC) 10 MG tablet Take 10 mg by mouth daily.    Historical Provider, MD  carvedilol (COREG) 3.125 MG tablet Take 1 tablet (3.125 mg total) by mouth 2 (two) times daily with a meal. 08/29/13   Nishant Dhungel, MD  gabapentin (NEURONTIN) 300 MG capsule Take 300-600 mg by mouth See admin instructions. Take 300mg  in the morning, 300mg  at 12, and 2 tablets (600mg ) in the evening. 06/14/13   Lanice Shirts, MD  HYDROmorphone (DILAUDID) 4 MG tablet Take one tablet by mouth every 6 hours as needed for pain 08/30/13   Blanchie Serve, MD  ibuprofen (ADVIL,MOTRIN) 200 MG tablet Take 200-400 mg by mouth 3 (three) times daily as needed for moderate pain.     Historical Provider, MD  losartan (COZAAR) 50 MG tablet Take 50 mg by mouth daily.    Historical Provider, MD  polyethylene glycol (MIRALAX / GLYCOLAX) packet Take 17 g by mouth as needed for mild constipation.  09/24/13   Historical Provider, MD  rOPINIRole (REQUIP) 2  MG tablet Take 2 mg by mouth at bedtime.    Historical Provider, MD  rOPINIRole (REQUIP) 2 MG tablet TAKE 1-2 TABLETS TWICE DAILY AS NEEDED (RESTLESS LEG).    Lanice Shirts, MD   BP 150/56  Pulse 54  Temp(Src) 98 F (36.7 C) (Oral)  Resp 16  SpO2 100% Physical Exam  Nursing note and vitals reviewed. Constitutional: No distress.  HENT:  Head: Normocephalic and atraumatic.  Right Ear: External ear normal.  Left Ear: External ear normal.  Eyes: Conjunctivae are normal. Right eye exhibits no discharge. Left eye exhibits no discharge. No scleral icterus.  Neck: Neck supple. No tracheal deviation present.  Cardiovascular: Normal rate, regular rhythm and intact distal pulses.   Pulmonary/Chest: Effort normal and breath sounds normal. No stridor. No respiratory distress. She has no wheezes. She has no rales.  Abdominal: Soft. Bowel sounds are  normal. She exhibits no distension. There is no tenderness. There is no rebound and no guarding.  Musculoskeletal: She exhibits edema. She exhibits no tenderness.  Feeding Edema bilateral lower extremities up to the midcalf, no calf tenderness, no erythema, feet are warm and well perfused, strong dorsalis pedis pulse bilaterally  Neurological: She is alert. She has normal strength. No cranial nerve deficit (no facial droop, extraocular movements intact, no slurred speech) or sensory deficit. She exhibits normal muscle tone. She displays no seizure activity. Coordination normal.  Skin: Skin is warm and dry. No rash noted. She is not diaphoretic.  Psychiatric: She has a normal mood and affect.    ED Course  Procedures (including critical care time) Labs Review Labs Reviewed  CBC WITH DIFFERENTIAL - Abnormal; Notable for the following:    RBC 3.61 (*)    Hemoglobin 10.4 (*)    HCT 32.8 (*)    All other components within normal limits  COMPREHENSIVE METABOLIC PANEL - Abnormal; Notable for the following:    Glucose, Bld 216 (*)    BUN 35 (*)    Total Protein 5.8 (*)    Albumin 3.1 (*)    GFR calc non Af Amer 48 (*)    GFR calc Af Amer 56 (*)    All other components within normal limits  PRO B NATRIURETIC PEPTIDE - Abnormal; Notable for the following:    Pro B Natriuretic peptide (BNP) 528.5 (*)    All other components within normal limits  D-DIMER, QUANTITATIVE - Abnormal; Notable for the following:    D-Dimer, Quant 1.42 (*)    All other components within normal limits  URINE CULTURE    Imaging Review Dg Chest 2 View  03/02/2014   CLINICAL DATA:  Bilateral foot and ankle swelling for 1 week. No injury. Hypertension.  EXAM: CHEST  2 VIEW  COMPARISON:  10/15/2013  FINDINGS: Moderate thoracic spondylosis. Osteopenia. Right glenohumeral joint osteoarthritis. Mild cardiomegaly. Tortuous thoracic aorta. No pleural effusion or pneumothorax. No congestive failure. Mild volume loss at the left  lung base.  IMPRESSION: Cardiomegaly, without congestive failure or acute disease.   Electronically Signed   By: Abigail Miyamoto M.D.   On: 03/02/2014 16:37     EKG Interpretation   Date/Time:  Thursday March 02 2014 15:36:42 EDT Ventricular Rate:  70 PR Interval:  191 QRS Duration: 88 QT Interval:  403 QTC Calculation: 435 R Axis:   -30 Text Interpretation:  Sinus rhythm Left atrial enlargement Left axis  deviation Probable anteroseptal infarct, old Baseline wander in lead(s) V1  V3 No significant change since last tracing Confirmed  by [facility]  MD-J, Wille Glaser  ([id number]) on 03/02/2014 3:42:58 PM      MDM   Final diagnoses:  Baker cyst, unspecified laterality  Peripheral edema   No dvt noted on [place].  Suspect peripheral edema.  DIscussed support hose and low dose lasix.   While getting ready for dc nurse noticed pt had to use the bed pan several times.  Disussed doing a UA.  Pt does not want to have to wait longer.  Will send off urine culture.  Follow up with PCP   Dorie Rank, MD 03/02/14 2106

## 2014-03-02 NOTE — ED Notes (Signed)
[id number] Shelly Martin

## 2014-03-02 NOTE — ED Notes (Signed)
Pt reports ankle swelling since 02/20/2014. Pt denies SOB or CP. Pt reports walks with cane; no change in ability to walk. Pt denies pain.

## 2014-03-02 NOTE — Discharge Instructions (Signed)
Edema  Edema is an abnormal buildup of fluids. It is more common in your legs and thighs. Painless swelling of the feet and ankles is more likely as a person ages. It also is common in looser skin, like around your eyes.  HOME CARE   · Keep the affected body part above the level of the heart while lying down.  · Do not sit still or stand for a long time.  · Do not put anything right under your knees when you lie down.  · Do not wear tight clothes on your upper legs.  · Exercise your legs to help the puffiness (swelling) go down.  · Wear elastic bandages or support stockings as told by your doctor.  · A low-salt diet may help lessen the puffiness.  · Only take medicine as told by your doctor.  GET HELP IF:  · Treatment is not working.  · You have heart, liver, or kidney disease and notice that your skin looks puffy or shiny.  · You have puffiness in your legs that does not get better when you raise your legs.  · You have sudden weight gain for no reason.  GET HELP RIGHT AWAY IF:   · You have shortness of breath or chest pain.  · You cannot breathe when you lie down.  · You have pain, redness, or warmth in the areas that are puffy.  · You have heart, liver, or kidney disease and get edema all of a sudden.  · You have a fever and your symptoms get worse all of a sudden.  MAKE SURE YOU:   · Understand these instructions.  · Will watch your condition.  · Will get help right away if you are not doing well or get worse.  Document Released: 10/29/2007 Document Revised: 05/17/2013 Document Reviewed: 03/04/2013  ExitCare® Patient Information ©2015 [employer]. This information is not intended to replace advice given to you by your health care provider. Make sure you discuss any questions you have with your health care provider.

## 2014-03-02 NOTE — ED Notes (Signed)
Pt made aware vascular tech will be at bedside around 2000.

## 2014-03-02 NOTE — ED Notes (Signed)
Bilateral venous doppler exam completed.

## 2014-03-02 NOTE — Progress Notes (Signed)
VASCULAR LAB PRELIMINARY  PRELIMINARY  PRELIMINARY  PRELIMINARY  Bilateral lower extremity venous duplex completed.    Preliminary report:  Right  ; No evidence of deep or superficial vein thrombosis. There is an area of mixed echoes coursing from the popliteal fossa 4.18 cm into the proximal calf  Consistent with a ruptured Baker's cyst. Left:  No evidence of DVT, superficial thrombosis, or Baker's cyst. Bilateral - Mild to moderate interstitial fluid in the lower leg.  Noelle Hoogland, [place], RVS 03/02/2014, 8:28 PM

## 2014-03-02 NOTE — ED Notes (Signed)
Pt waiting for [facility] transport back home.

## 2014-03-02 NOTE — ED Notes (Signed)
Pt request something to eat; per Hillard Danker MD pt can eat while awaiting doppler. Pt given [place] sandwich, graham crackers, and ginger ale.

## 2014-03-02 NOTE — ED Notes (Signed)
Per pt, states B/L ankle swelling for over a week-no SOB, no injury

## 2014-03-03 LAB — URINE CULTURE
COLONY COUNT: NO GROWTH
CULTURE: NO GROWTH

## 2014-03-05 NOTE — Progress Notes (Signed)
Subjective:    Patient ID: Shelly Martin, female    DOB: 1919-04-07, 80 y.o.   MRN: [id number]  HPI Vascular report 10/8 Bilateral lower extremity venous duplex completed.  Preliminary report: Right ; No evidence of deep or superficial vein thrombosis. There is an area of mixed echoes coursing from the popliteal fossa 4.18 cm into the proximal calf Consistent with a ruptured Baker's cyst. Left: No evidence of DVT, superficial thrombosis, or Baker's cyst. Bilateral - Mild to moderate interstitial fluid in the lower leg.  Martin, Shelly, RVS  03/02/2014, 8:28 PM   Shelly Martin is here for follow up of LE edema  She is here with her daughter Shelly Martin  Was seen in ER 10/8 and found to have ruptured Baker's cyst behind Right knee.   NO DVT.  She was given Lasix 20 mg for 5 days only.  She notes improvement in bilateral LE edema  She does still have chronic hip and R shoulder pain (she has seen Dr. Maureen Ralphs about this).     Shelly Martin reports that pt forgets to take her daily pain med.  Pain much better controlled   Allergies  Allergen Reactions  . Amoxicillin Other (See Comments)    Reaction unknown  . Baclofen Other (See Comments)    "fuzzy in the eyes" and dizzy  . Hctz [Hydrochlorothiazide]     nausea  . Valium [Diazepam] Other (See Comments)    Pt became unresponsive and O2 sats dropped.   Past Medical History  Diagnosis Date  . Hypertension   . Restless legs syndrome     on Requip  . Ulcer causing bleeding and hole in wall of stomach or small intestine   . Gastric ulcer     years ago  . Arthritis     Back, knees  . Constipation   . Urinary bladder calculus   . Urinary incontinence   . Bowel incontinence   . Aortic stenosis   . Stroke     Mini, no residual   Past Surgical History  Procedure Laterality Date  . Colon resection  50 years ago  . Tonsillectomy    . Uterine fibroid surgery    . Lumbar laminectomy/decompression microdiscectomy  06/25/2011    Procedure: LUMBAR  LAMINECTOMY/DECOMPRESSION MICRODISCECTOMY;  Surgeon: Hosie Spangle, MD;  Location: [facility];  Service: Neurosurgery;  Laterality: Right;  RIGHT Lumbar Two-Three hemilaminectomy and microdiskectomy  . Vaginal hysterectomy     History   Social History  . Marital Status: Widowed    Spouse Name: N/A    Number of Children: 2  . Years of Education: N/A   Occupational History  . Retired    Social History Main Topics  . Smoking status: Never Smoker   . Smokeless tobacco: Never Used  . Alcohol Use: Yes     Comment: rarely  . Drug Use: No  . Sexual Activity: Not Currently   Other Topics Concern  . Not on file   Social History Narrative  . No narrative on file   Family History  Problem Relation Age of Onset  . Anesthesia problems Neg Hx   . Pancreatic cancer Brother   . Dementia Mother   . Stroke Father    Patient Active Problem List   Diagnosis Date Noted  . Leg swelling 03/02/2014  . Avascular necrosis of bone of right hip 09/27/2013  . Primary osteoarthritis of right hip 09/07/2013  . Spinal stenosis, lumbar region, with neurogenic claudication 09/07/2013  . Closed fracture  of pubic ramus 09/07/2013  . BPPV (benign paroxysmal positional vertigo) 09/07/2013  . Pharyngeal swelling 08/30/2013  . Pelvic fracture 08/28/2013  . Fall 08/26/2013  . Dizziness 08/26/2013  . Abnormal stress test 08/26/2013  . Preop cardiovascular exam 08/01/2013  . Aortic stenosis 08/01/2013  . Edema 06/30/2013  . Loss of weight 01/04/2013  . Situational stress 01/04/2013  . Chronic right hip pain 07/05/2012  . Knee pain, acute 06/29/2012  . Vertigo 06/24/2012  . Acute exacerbation of chronic low back pain 06/24/2012  . Lumbar pain with radiation down right leg 06/09/2012  . DJD (degenerative joint disease) of hip 04/29/2012  . History of TIAs 04/17/2012  . S/P total hysterectomy and bilateral salpingo-oophorectomy 04/17/2012  . Hard of hearing 04/17/2012  . History of anemia  04/17/2012  . RLS (restless legs syndrome) 04/17/2012  . Chronic back pain 04/17/2012  . Spinal stenosis, lumbar 04/17/2012  . Lower back pain 12/20/2011  . Personal history of colonic polyps 10/08/2011  . H/O: CVA (cardiovascular accident) 09/14/2011  . Hypertension 09/14/2011  . Restless legs syndrome 09/14/2011  . Unspecified constipation 08/20/2011  . Urinary incontinence 08/20/2011   Current Outpatient Prescriptions on File Prior to Visit  Medication Sig Dispense Refill  . amLODipine (NORVASC) 10 MG tablet Take 10 mg by mouth daily.      . carvedilol (COREG) 3.125 MG tablet Take 1 tablet (3.125 mg total) by mouth 2 (two) times daily with a meal.  60 tablet  0  . furosemide (LASIX) 20 MG tablet Take 1 tablet (20 mg total) by mouth daily.  5 tablet  0  . gabapentin (NEURONTIN) 300 MG capsule Take 300-600 mg by mouth See admin instructions. Take 300mg  in the morning, 300mg  at 12, and 2 tablets (600mg ) in the evening.      Marland Kitchen HYDROmorphone (DILAUDID) 4 MG tablet Take one tablet by mouth every 6 hours as needed for pain  120 tablet  0  . ibuprofen (ADVIL,MOTRIN) 200 MG tablet Take 200-400 mg by mouth 3 (three) times daily as needed for moderate pain.       Marland Kitchen losartan (COZAAR) 50 MG tablet Take 50 mg by mouth daily.      . polyethylene glycol (MIRALAX / GLYCOLAX) packet Take 17 g by mouth as needed for mild constipation.       Marland Kitchen rOPINIRole (REQUIP) 2 MG tablet Take 2 mg by mouth at bedtime.      Marland Kitchen rOPINIRole (REQUIP) 2 MG tablet TAKE 1-2 TABLETS TWICE DAILY AS NEEDED (RESTLESS LEG).  180 tablet  1   No current facility-administered medications on file prior to visit.       Review of Systems See HPI    Objective:   Physical Exam  Physical Exam  Nursing note and vitals reviewed.  Constitutional: She is oriented to person, place, and time. She appears well-developed and well-nourished.  HENT:  Head: Normocephalic and atraumatic.  Cardiovascular: Normal rate and regular rhythm.  Exam reveals no gallop and no friction rub.  No murmur heard.  Pulmonary/Chest: Breath sounds normal. She has no wheezes. She has no rales.  Neurological: She is alert and oriented to person, place, and time.  Skin: Skin is warm and dry.  Ext  1+ edema bilaterally Psychiatric: She has a normal mood and affect. Her behavior is normal.        Assessment & Plan:  Bilateral LE Edema   Ok for 1/2 or one whole lasix daily for two weeks . ADvised to  be sure to take K  Only when she take diuretic  Will check K today   R ruptured bakers's cyst  Chronic pain syndrome.   Will give flu vaccine today  See me in 8 weeks or sooner prn

## 2014-03-06 ENCOUNTER — Ambulatory Visit (INDEPENDENT_AMBULATORY_CARE_PROVIDER_SITE_OTHER): Payer: Medicare Other | Admitting: Internal Medicine

## 2014-03-06 ENCOUNTER — Encounter: Payer: Self-pay | Admitting: Internal Medicine

## 2014-03-06 VITALS — BP 177/75 | HR 75 | Temp 98.5°F | Resp 16 | Wt 106.0 lb

## 2014-03-06 DIAGNOSIS — G894 Chronic pain syndrome: Secondary | ICD-10-CM

## 2014-03-06 DIAGNOSIS — M660 Rupture of popliteal cyst: Secondary | ICD-10-CM

## 2014-03-06 DIAGNOSIS — R609 Edema, unspecified: Secondary | ICD-10-CM

## 2014-03-06 DIAGNOSIS — Z23 Encounter for immunization: Secondary | ICD-10-CM

## 2014-03-06 LAB — BASIC METABOLIC PANEL
BUN: 28 mg/dL — ABNORMAL HIGH (ref 6–23)
CHLORIDE: 101 meq/L (ref 96–112)
CO2: 28 mEq/L (ref 19–32)
Calcium: 8.9 mg/dL (ref 8.4–10.5)
Creat: 0.96 mg/dL (ref 0.50–1.10)
Glucose, Bld: 107 mg/dL — ABNORMAL HIGH (ref 70–99)
POTASSIUM: 4.5 meq/L (ref 3.5–5.3)
Sodium: 139 mEq/L (ref 135–145)

## 2014-03-06 LAB — LIPID PANEL
Cholesterol: 172 mg/dL (ref 0–200)
HDL: 61 mg/dL (ref >39)
LDL Cholesterol: 88 mg/dL (ref 0–99)
Total CHOL/HDL Ratio: 2.8 Ratio
Triglycerides: 114 mg/dL (ref <150)
VLDL: 23 mg/dL (ref 0–40)

## 2014-03-06 MED ORDER — FUROSEMIDE 20 MG PO TABS: ORAL_TABLET | ORAL | Status: DC

## 2014-03-06 MED ORDER — POTASSIUM CHLORIDE CRYS ER 20 MEQ PO TBCR: EXTENDED_RELEASE_TABLET | ORAL | Status: DC

## 2014-03-06 NOTE — Patient Instructions (Signed)
See me in December  30 min visit

## 2014-03-16 ENCOUNTER — Observation Stay (HOSPITAL_COMMUNITY)
Admission: EM | Admit: 2014-03-16 | Discharge: 2014-03-21 | Disposition: A | Discharge status: Skilled Nursing Facility | Payer: Medicare Other | Attending: Internal Medicine | Admitting: Internal Medicine

## 2014-03-16 ENCOUNTER — Emergency Department (HOSPITAL_COMMUNITY): Payer: Medicare Other

## 2014-03-16 ENCOUNTER — Encounter (HOSPITAL_COMMUNITY): Payer: Self-pay | Admitting: Emergency Medicine

## 2014-03-16 DIAGNOSIS — Z8673 Personal history of transient ischemic attack (TIA), and cerebral infarction without residual deficits: Secondary | ICD-10-CM

## 2014-03-16 DIAGNOSIS — E860 Dehydration: Secondary | ICD-10-CM | POA: Diagnosis not present

## 2014-03-16 DIAGNOSIS — G9340 Encephalopathy, unspecified: Secondary | ICD-10-CM | POA: Diagnosis present

## 2014-03-16 DIAGNOSIS — Z9079 Acquired absence of other genital organ(s): Secondary | ICD-10-CM

## 2014-03-16 DIAGNOSIS — M87851 Other osteonecrosis, right femur: Secondary | ICD-10-CM | POA: Insufficient documentation

## 2014-03-16 DIAGNOSIS — Z79899 Other long term (current) drug therapy: Secondary | ICD-10-CM | POA: Insufficient documentation

## 2014-03-16 DIAGNOSIS — M1611 Unilateral primary osteoarthritis, right hip: Secondary | ICD-10-CM

## 2014-03-16 DIAGNOSIS — Z79891 Long term (current) use of opiate analgesic: Secondary | ICD-10-CM | POA: Diagnosis not present

## 2014-03-16 DIAGNOSIS — Z90722 Acquired absence of ovaries, bilateral: Secondary | ICD-10-CM

## 2014-03-16 DIAGNOSIS — G2581 Restless legs syndrome: Secondary | ICD-10-CM | POA: Diagnosis not present

## 2014-03-16 DIAGNOSIS — M48062 Spinal stenosis, lumbar region with neurogenic claudication: Secondary | ICD-10-CM

## 2014-03-16 DIAGNOSIS — G8929 Other chronic pain: Secondary | ICD-10-CM | POA: Diagnosis not present

## 2014-03-16 DIAGNOSIS — R32 Unspecified urinary incontinence: Secondary | ICD-10-CM | POA: Insufficient documentation

## 2014-03-16 DIAGNOSIS — M48061 Spinal stenosis, lumbar region without neurogenic claudication: Secondary | ICD-10-CM | POA: Diagnosis present

## 2014-03-16 DIAGNOSIS — F439 Reaction to severe stress, unspecified: Secondary | ICD-10-CM

## 2014-03-16 DIAGNOSIS — M169 Osteoarthritis of hip, unspecified: Secondary | ICD-10-CM | POA: Diagnosis present

## 2014-03-16 DIAGNOSIS — R55 Syncope and collapse: Principal | ICD-10-CM | POA: Diagnosis present

## 2014-03-16 DIAGNOSIS — R634 Abnormal weight loss: Secondary | ICD-10-CM

## 2014-03-16 DIAGNOSIS — M79604 Pain in right leg: Secondary | ICD-10-CM

## 2014-03-16 DIAGNOSIS — H8110 Benign paroxysmal vertigo, unspecified ear: Secondary | ICD-10-CM | POA: Diagnosis present

## 2014-03-16 DIAGNOSIS — R42 Dizziness and giddiness: Secondary | ICD-10-CM

## 2014-03-16 DIAGNOSIS — M4806 Spinal stenosis, lumbar region: Secondary | ICD-10-CM | POA: Diagnosis not present

## 2014-03-16 DIAGNOSIS — M545 Low back pain, unspecified: Secondary | ICD-10-CM

## 2014-03-16 DIAGNOSIS — R9439 Abnormal result of other cardiovascular function study: Secondary | ICD-10-CM

## 2014-03-16 DIAGNOSIS — I10 Essential (primary) hypertension: Secondary | ICD-10-CM | POA: Diagnosis not present

## 2014-03-16 DIAGNOSIS — I350 Nonrheumatic aortic (valve) stenosis: Secondary | ICD-10-CM

## 2014-03-16 DIAGNOSIS — M549 Dorsalgia, unspecified: Secondary | ICD-10-CM | POA: Diagnosis not present

## 2014-03-16 DIAGNOSIS — R4182 Altered mental status, unspecified: Secondary | ICD-10-CM | POA: Diagnosis present

## 2014-03-16 DIAGNOSIS — M25551 Pain in right hip: Secondary | ICD-10-CM

## 2014-03-16 DIAGNOSIS — J392 Other diseases of pharynx: Secondary | ICD-10-CM

## 2014-03-16 DIAGNOSIS — Z90710 Acquired absence of both cervix and uterus: Secondary | ICD-10-CM

## 2014-03-16 DIAGNOSIS — Z862 Personal history of diseases of the blood and blood-forming organs and certain disorders involving the immune mechanism: Secondary | ICD-10-CM

## 2014-03-16 DIAGNOSIS — R609 Edema, unspecified: Secondary | ICD-10-CM

## 2014-03-16 DIAGNOSIS — M87051 Idiopathic aseptic necrosis of right femur: Secondary | ICD-10-CM | POA: Diagnosis present

## 2014-03-16 DIAGNOSIS — M7989 Other specified soft tissue disorders: Secondary | ICD-10-CM

## 2014-03-16 DIAGNOSIS — Z01810 Encounter for preprocedural cardiovascular examination: Secondary | ICD-10-CM

## 2014-03-16 LAB — COMPREHENSIVE METABOLIC PANEL
ALK PHOS: 94 U/L (ref 39–117)
ALT: 18 U/L (ref 0–35)
ANION GAP: 11 (ref 5–15)
AST: 36 U/L (ref 0–37)
Albumin: 3.6 g/dL (ref 3.5–5.2)
BILIRUBIN TOTAL: 0.4 mg/dL (ref 0.3–1.2)
BUN: 43 mg/dL — AB (ref 6–23)
CO2: 29 meq/L (ref 19–32)
Calcium: 9.1 mg/dL (ref 8.4–10.5)
Chloride: 101 mEq/L (ref 96–112)
Creatinine, Ser: 1.17 mg/dL — ABNORMAL HIGH (ref 0.50–1.10)
GFR, EST AFRICAN AMERICAN: 45 mL/min — AB (ref >90)
GFR, EST NON AFRICAN AMERICAN: 39 mL/min — AB (ref >90)
GLUCOSE: 125 mg/dL — AB (ref 70–99)
POTASSIUM: 4.6 meq/L (ref 3.7–5.3)
Sodium: 141 mEq/L (ref 137–147)
TOTAL PROTEIN: 6.8 g/dL (ref 6.0–8.3)

## 2014-03-16 LAB — TROPONIN I: Troponin I: <0.3 ng/mL (ref <0.30)

## 2014-03-16 LAB — URINALYSIS, ROUTINE W REFLEX MICROSCOPIC
Bilirubin Urine: NEGATIVE
Glucose, UA: NEGATIVE mg/dL
HGB URINE DIPSTICK: NEGATIVE
Ketones, ur: NEGATIVE mg/dL
Leukocytes, UA: NEGATIVE
Nitrite: NEGATIVE
Protein, ur: NEGATIVE mg/dL
SPECIFIC GRAVITY, URINE: 1.017 (ref 1.005–1.030)
Urobilinogen, UA: 0.2 mg/dL (ref 0.0–1.0)
pH: 5 (ref 5.0–8.0)

## 2014-03-16 LAB — CBC WITH DIFFERENTIAL/PLATELET
Basophils Absolute: 0 10*3/uL (ref 0.0–0.1)
Basophils Relative: 0 % (ref 0–1)
EOS ABS: 0.1 10*3/uL (ref 0.0–0.7)
Eosinophils Relative: 1 % (ref 0–5)
HCT: 34.2 % — ABNORMAL LOW (ref 36.0–46.0)
HEMOGLOBIN: 10.8 g/dL — AB (ref 12.0–15.0)
LYMPHS ABS: 1.1 10*3/uL (ref 0.7–4.0)
LYMPHS PCT: 19 % (ref 12–46)
MCH: 28.6 pg (ref 26.0–34.0)
MCHC: 31.6 g/dL (ref 30.0–36.0)
MCV: 90.5 fL (ref 78.0–100.0)
MONOS PCT: 8 % (ref 3–12)
Monocytes Absolute: 0.5 10*3/uL (ref 0.1–1.0)
NEUTROS PCT: 72 % (ref 43–77)
Neutro Abs: 4.4 10*3/uL (ref 1.7–7.7)
Platelets: 231 10*3/uL (ref 150–400)
RBC: 3.78 MIL/uL — ABNORMAL LOW (ref 3.87–5.11)
RDW: 14.4 % (ref 11.5–15.5)
WBC: 6.1 10*3/uL (ref 4.0–10.5)

## 2014-03-16 LAB — CBG MONITORING, ED: GLUCOSE-CAPILLARY: 125 mg/dL — AB (ref 70–99)

## 2014-03-16 LAB — TSH: TSH: 0.893 u[IU]/mL (ref 0.350–4.500)

## 2014-03-16 MED ORDER — SODIUM CHLORIDE 0.9 % IJ SOLN
3.0000 mL | Freq: Two times a day (BID) | INTRAMUSCULAR | Status: DC
  Administered 2014-03-16 – 2014-03-21 (×8): 3 mL via INTRAVENOUS

## 2014-03-16 MED ORDER — SODIUM CHLORIDE 0.9 % IV SOLN
INTRAVENOUS | Status: AC
  Administered 2014-03-17: 02:00:00 via INTRAVENOUS

## 2014-03-16 MED ORDER — ONDANSETRON HCL 4 MG/2ML IJ SOLN
4.0000 mg | Freq: Four times a day (QID) | INTRAMUSCULAR | Status: DC | PRN
  Administered 2014-03-18: 4 mg via INTRAVENOUS
  Filled 2014-03-16: qty 2

## 2014-03-16 MED ORDER — HEPARIN SODIUM (PORCINE) 5000 UNIT/ML IJ SOLN
5000.0000 [IU] | Freq: Three times a day (TID) | INTRAMUSCULAR | Status: DC
  Administered 2014-03-16 – 2014-03-21 (×14): 5000 [IU] via SUBCUTANEOUS
  Filled 2014-03-16 (×15): qty 1

## 2014-03-16 MED ORDER — POLYETHYLENE GLYCOL 3350 17 G PO PACK
17.0000 g | PACK | ORAL | Status: DC | PRN
  Filled 2014-03-16: qty 1

## 2014-03-16 MED ORDER — ONDANSETRON HCL 4 MG PO TABS: 4.0000 mg | ORAL_TABLET | Freq: Four times a day (QID) | ORAL | Status: DC | PRN

## 2014-03-16 MED ORDER — ACETAMINOPHEN 650 MG RE SUPP: 650.0000 mg | Freq: Four times a day (QID) | RECTAL | Status: DC | PRN

## 2014-03-16 MED ORDER — GABAPENTIN 300 MG PO CAPS
300.0000 mg | ORAL_CAPSULE | Freq: Two times a day (BID) | ORAL | Status: DC
  Administered 2014-03-17 – 2014-03-21 (×10): 300 mg via ORAL
  Filled 2014-03-16 (×10): qty 1

## 2014-03-16 MED ORDER — OXYCODONE HCL 5 MG PO TABS: 5.0000 mg | ORAL_TABLET | ORAL | Status: DC | PRN

## 2014-03-16 MED ORDER — ACETAMINOPHEN 325 MG PO TABS: 650.0000 mg | ORAL_TABLET | Freq: Four times a day (QID) | ORAL | Status: DC | PRN

## 2014-03-16 MED ORDER — DOCUSATE SODIUM 100 MG PO CAPS
100.0000 mg | ORAL_CAPSULE | Freq: Two times a day (BID) | ORAL | Status: DC
  Administered 2014-03-16 – 2014-03-21 (×10): 100 mg via ORAL
  Filled 2014-03-16 (×10): qty 1

## 2014-03-16 MED ORDER — MORPHINE SULFATE 15 MG PO TABS
15.0000 mg | ORAL_TABLET | Freq: Two times a day (BID) | ORAL | Status: DC | PRN
  Administered 2014-03-18 – 2014-03-21 (×4): 15 mg via ORAL
  Filled 2014-03-16 (×4): qty 1

## 2014-03-16 MED ORDER — GABAPENTIN 300 MG PO CAPS
600.0000 mg | ORAL_CAPSULE | Freq: Every day | ORAL | Status: DC
  Administered 2014-03-16 – 2014-03-20 (×5): 600 mg via ORAL
  Filled 2014-03-16 (×6): qty 2

## 2014-03-16 NOTE — Progress Notes (Signed)
Report received from ED RN. All questions answered. Pt coming to [id number].

## 2014-03-16 NOTE — H&P (Signed)
[facility] History and Physical  Shelly Martin SWH:[id number] DOB: 09/03/1918 DOA: 03/16/2014   PCP: Kelton Pillar, MD  Specialists: None  Chief Complaint: Passed out  HPI: Shelly Martin is a 80 y.o. female with a past medical history of mild to moderate aortic stenosis, history of TIA, chronic back pain due to spinal stenosis and degenerative disc disease, chronic right hip pain due to severe osteoarthritis, who apparently was in her usual state of health till about 2 days ago, when her daughter noticed that patient was confused. She would not answer appropriately. She would take a long time to answer questions. It would appear to the daughter that the patient was searching for words. However there was no focal weakness. No recent fever or chills. No recent illness. She did get a flu shot last week. She was also recently prescribed Lasix for lower extremity edema. Patient's daughter mentions, that this amount of confusion was quite extraordinary for this patient. Apparently, she was working up until she was 43. She has been quite independent and was driving up until recently. And this morning patient became argumentative with the daughter. The daughter had to leave the patient in living room to go to the kitchen and when she came back she found the patient lying with her eyes closed on the couch. She could not wake her up for many minutes. EMS was called. When the patient started waking up she was quite confused. She would not recognize the daughter initially. Subsequently patient was brought into the hospital. Subsequently, her mental status improved, but according to the daughter she still remains confused. Patient denies any chest pain or shortness of breath. Denies any nausea, vomiting. She has concerned about her weight loss, which has been ongoing for a year. She's lost about 25-30 pounds. Otherwise, she was unable to provide much information.  Home Medications: Prior to  Admission medications   Medication Sig Start Date End Date Taking? Authorizing Provider  furosemide (LASIX) 20 MG tablet Take 1/2 or one whole pill daily for 2 weeks with your potassium 03/06/14   Lanice Shirts, MD  gabapentin (NEURONTIN) 300 MG capsule Take 300-600 mg by mouth See admin instructions. Take 313m in the morning, 3045mat 12, and 2 tablets (60056min the evening. 06/14/13   DebLanice ShirtsD  ibuprofen (ADVIL,MOTRIN) 200 MG tablet Take 200-400 mg by mouth 3 (three) times daily as needed for moderate pain.     Historical Provider, MD  morphine (MSIR) 15 MG tablet Take 15 mg by mouth 2 (two) times daily as needed for severe pain.    Historical Provider, MD  polyethylene glycol (MIRALAX / GLYCOLAX) packet Take 17 g by mouth as needed for mild constipation.  09/24/13   Historical Provider, MD  potassium chloride SA (K-DUR,KLOR-CON) 20 MEQ tablet Take one tablet when you take your diuretic pill 03/06/14   DebLanice ShirtsD  rOPINIRole (REQUIP) 2 MG tablet TAKE 1-2 TABLETS TWICE DAILY AS NEEDED (RESTLESS LEG).    DebLanice ShirtsD    Allergies:  Allergies  Allergen Reactions  . Amoxicillin Other (See Comments)    Reaction unknown  . Baclofen Other (See Comments)    "fuzzy in the eyes" and dizzy  . Hctz [Hydrochlorothiazide]     nausea  . Valium [Diazepam] Other (See Comments)    Pt became unresponsive and O2 sats dropped.    Past Medical History: Past Medical History  Diagnosis Date  . Hypertension   . Restless legs syndrome  on Requip  . Ulcer causing bleeding and hole in wall of stomach or small intestine   . Gastric ulcer     years ago  . Arthritis     Back, knees  . Constipation   . Urinary bladder calculus   . Urinary incontinence   . Bowel incontinence   . Aortic stenosis   . Stroke     Mini, no residual    Past Surgical History  Procedure Laterality Date  . Colon resection  50 years ago  . Tonsillectomy    . Uterine fibroid  surgery    . Lumbar laminectomy/decompression microdiscectomy  06/25/2011    Procedure: LUMBAR LAMINECTOMY/DECOMPRESSION MICRODISCECTOMY;  Surgeon: Hosie Spangle, MD;  Location: [facility];  Service: Neurosurgery;  Laterality: Right;  RIGHT Lumbar Two-Three hemilaminectomy and microdiskectomy  . Vaginal hysterectomy      Social History: Daughter, lives with the patient. There is no history of smoking or alcohol use. No illicit drug use. Usually independent with daily activities. Independent with her ADLs up until 2 days ago. She apparently was also managing her own finances up until recently.  Family History:  Family History  Problem Relation Age of Onset  . Anesthesia problems Neg Hx   . Pancreatic cancer Brother   . Dementia Mother   . Stroke Father      Review of Systems - unable to do due to mental status  Physical Examination  Filed Vitals:   03/16/14 1228 03/16/14 1300 03/16/14 1315 03/16/14 1500  BP:  105/39 101/46 124/41  Pulse:  61 58 70  Temp:  97.6 F (36.4 C)    TempSrc:  Rectal    Resp:  _0 SpO2: 96% 99% 100% 94%    BP 124/41  Pulse 70  Temp(Src) 97.6 F (36.4 C) (Rectal)  Resp 18  SpO2 94%  General appearance: alert, cooperative, appears stated age, distracted and no distress Head: Normocephalic, without obvious abnormality, atraumatic Eyes: conjunctivae/corneas clear. PERRL, EOM's intact.  Throat: lips, mucosa, and tongue normal; teeth and gums normal Neck: no adenopathy, no carotid bruit, no JVD, supple, symmetrical, trachea midline and thyroid not enlarged, symmetric, no tenderness/mass/nodules Resp: clear to auscultation bilaterally Cardio: regular rate and rhythm, S1, S2 normal, systolic murmur: early systolic 2/6, crescendo throughout the precordium, no click and no rub GI: soft, non-tender; bowel sounds normal; no masses,  no organomegaly Extremities: Mild edema, nonpitting. Some difficulty with raising the right leg. However, no  limitation or tenderness with medication of the right hip joint. Straight leg raising test was equivocal. Pulses: 2+ and symmetric Skin: Chronic skin changes in the lower extremity. Few bruises noted. Lymph nodes: Cervical, supraclavicular, and axillary nodes normal. Neurologic: Alert. Distracted. No facial asymmetry. Tongue is midline. Strength 55 bilateral upper extremities. Arthritis in the right hip limited examination of the right lower extremity but left was 5 out of 5 as well. Gait was not assessed.  Laboratory Data: Results for orders placed during the hospital encounter of 03/16/14 (from the past 48 hour(s))  CBG MONITORING, ED     Status: Abnormal   Collection Time    03/16/14  2:23 PM      Result Value Ref Range   Glucose-Capillary 125 (*) 70 - 99 mg/dL  CBC WITH DIFFERENTIAL     Status: Abnormal   Collection Time    03/16/14  2:30 PM      Result Value Ref Range   WBC 6.1  4.0 - 10.5 K/uL  RBC 3.78 (*) 3.87 - 5.11 MIL/uL   Hemoglobin 10.8 (*) 12.0 - 15.0 g/dL   HCT 34.2 (*) 36.0 - 46.0 %   MCV 90.5  78.0 - 100.0 fL   MCH 28.6  26.0 - 34.0 pg   MCHC 31.6  30.0 - 36.0 g/dL   RDW 14.4  11.5 - 15.5 %   Platelets 231  150 - 400 K/uL   Neutrophils Relative % 72  43 - 77 %   Neutro Abs 4.4  1.7 - 7.7 K/uL   Lymphocytes Relative 19  12 - 46 %   Lymphs Abs 1.1  0.7 - 4.0 K/uL   Monocytes Relative 8  3 - 12 %   Monocytes Absolute 0.5  0.1 - 1.0 K/uL   Eosinophils Relative 1  0 - 5 %   Eosinophils Absolute 0.1  0.0 - 0.7 K/uL   Basophils Relative 0  0 - 1 %   Basophils Absolute 0.0  0.0 - 0.1 K/uL  COMPREHENSIVE METABOLIC PANEL     Status: Abnormal   Collection Time    03/16/14  2:30 PM      Result Value Ref Range   Sodium 141  137 - 147 mEq/L   Potassium 4.6  3.7 - 5.3 mEq/L   Chloride 101  96 - 112 mEq/L   CO2 29  19 - 32 mEq/L   Glucose, Bld 125 (*) 70 - 99 mg/dL   BUN 43 (*) 6 - 23 mg/dL   Creatinine, Ser 1.17 (*) 0.50 - 1.10 mg/dL   Calcium 9.1  8.4 - 10.5 mg/dL     Total Protein 6.8  6.0 - 8.3 g/dL   Albumin 3.6  3.5 - 5.2 g/dL   AST 36  0 - 37 U/L   ALT 18  0 - 35 U/L   Alkaline Phosphatase 94  39 - 117 U/L   Total Bilirubin 0.4  0.3 - 1.2 mg/dL   GFR calc non Af Amer 39 (*) >90 mL/min   GFR calc Af Amer 45 (*) >90 mL/min   Comment: (NOTE)     The eGFR has been calculated using the CKD EPI equation.     This calculation has not been validated in all clinical situations.     eGFR's persistently <90 mL/min signify possible Chronic Kidney     Disease.   Anion gap 11  5 - 15  URINALYSIS, ROUTINE W REFLEX MICROSCOPIC     Status: None   Collection Time    03/16/14  2:34 PM      Result Value Ref Range   Color, Urine YELLOW  YELLOW   APPearance CLEAR  CLEAR   Specific Gravity, Urine 1.017  1.005 - 1.030   pH 5.0  5.0 - 8.0   Glucose, UA NEGATIVE  NEGATIVE mg/dL   Hgb urine dipstick NEGATIVE  NEGATIVE   Bilirubin Urine NEGATIVE  NEGATIVE   Ketones, ur NEGATIVE  NEGATIVE mg/dL   Protein, ur NEGATIVE  NEGATIVE mg/dL   Urobilinogen, UA 0.2  0.0 - 1.0 mg/dL   Nitrite NEGATIVE  NEGATIVE   Leukocytes, UA NEGATIVE  NEGATIVE   Comment: MICROSCOPIC NOT DONE ON URINES WITH NEGATIVE PROTEIN, BLOOD, LEUKOCYTES, NITRITE, OR GLUCOSE <1000 mg/dL.    Radiology Reports: Dg Chest 2 View  03/16/2014   CLINICAL DATA:  Altered mental status.  Weakness.  EXAM: CHEST  2 VIEW  COMPARISON:  03/02/2014.  FINDINGS: Normal cardiomediastinal silhouette. Clear lung fields. Osteopenia. Glenohumeral degenerative change on  the RIGHT. Advanced thoracic spondylosis.  IMPRESSION: No active disease.  Fluid change from priors.   Electronically Signed   By: Rolla Flatten M.D.   On: 03/16/2014 14:18   Ct Head Wo Contrast  03/16/2014   CLINICAL DATA:  Altered mental status. Increased confusion. Disorientation.  EXAM: CT HEAD WITHOUT CONTRAST  TECHNIQUE: Contiguous axial images were obtained from the base of the skull through the vertex without contrast.  COMPARISON:  CT head  08/26/2013.  FINDINGS: Generalized atrophy with chronic microvascular ischemic change persists. There is no acute stroke or hemorrhage. A 11 mm LEFT anterior falx meningioma is redemonstrated and stable. Calvarium is intact. There is mild chronic sinus disease. No mastoid fluid. Vascular calcification is noted.  IMPRESSION: Stable exam.  No acute intracranial findings.   Electronically Signed   By: Rolla Flatten M.D.   On: 03/16/2014 14:08    Electrocardiogram: Sinus rhythm at 62 beats a minute. Left axis deviation is noted. No Q waves. Intervals appear to be normal. Otherwise. No concerning ST or T-wave changes. Similar to EKG from earlier this month.  Problem List  Principal Problem:   Syncope Active Problems:   History of TIAs   Chronic back pain   Spinal stenosis, lumbar   Osteoarthritis of hip   Chronic right hip pain   Aortic stenosis   BPPV (benign paroxysmal positional vertigo)   Avascular necrosis of bone of right hip   Acute encephalopathy   Assessment: This is a 80 year old, Caucasian female with a past medical history as stated earlier, who presents after what appears to be a syncopal episode. However there also is a history of alteration in her mental status for the last 2 days. She is noted to have slightly elevated BUN and creatinine, which most likely is secondary to the Lasix that was recently prescribed for lower extremity edema. This could have contributed to her syncope. Perhaps she was orthostatic, although it is not clear. Differential diagnoses, otherwise, is broad and includes arrhythmias, medication induced, related to her aortic stenosis. UA is normal. No other infectious source is identified.  Plan: #1 syncope: She'll be observed overnight on telemetry. We will obtain echocardiogram. Orthostatics will be checked. She'll be given gentle IV hydration. PT and OT will be consulted. There is a mention of BPPV in her past medical history, however, nothing in her current  presentation suggests an episode of vertigo.  #2 acute encephalopathy: Daughter mentions confusion for the last 2 days. Etiology remains unclear. At this time we will proceed with MRI of brain. She is afebrile. Continue to monitor.  #3 mild dehydration with elevated BUN and creatinine: This is most likely due to Lasix. Gentle hydration. Repeat labs in morning.  #4 history of aortic stenosis: This was reported as being mild to moderate previously. Follow up on the results of the echocardiogram.  #5 history of chronic low back pain due to spinal stenosis and chronic right hip pain due to severe arthritis: Continue with her pain regimen. PT/OT.  #6 history of weight loss: No obvious reason found. Check TSH. We'll defer this to outpatient providers.   DVT Prophylaxis: Heparin Code Status: Full code for now. Daughter to discuss this with the other siblings Family Communication: Discussed in detail with the daughter, Shelly Martin Disposition Plan: Observe telemetry   Further management decisions will depend on results of further testing and patient's response to treatment.   [facility]  [facility] Pager [phone]  If 7PM-7AM, please contact night-coverage [email or web address] Password [email or web address]  03/16/2014, 5:08 PM

## 2014-03-16 NOTE — ED Provider Notes (Signed)
CSN: [id number]     Arrival date & time 03/16/14  1228 History   First MD Initiated Contact with Patient 03/16/14 1228     Chief Complaint  Patient presents with  . Altered Mental Status     (Consider location/radiation/quality/duration/timing/severity/associated sxs/prior Treatment) Patient is a 80 y.o. female presenting with altered mental status.  Altered Mental Status Presenting symptoms: confusion and lethargy   Severity:  Moderate Episode history:  Continuous Duration:  3 days Timing:  Constant Progression:  Worsening Chronicity:  Recurrent Context: not alcohol use, not dementia, not drug use, not a nursing home resident and not a recent change in medication   Associated symptoms: no abdominal pain, no depression, no difficulty breathing, no fever, no hallucinations, no nausea and no vomiting     Past Medical History  Diagnosis Date  . Hypertension   . Restless legs syndrome     on Requip  . Ulcer causing bleeding and hole in wall of stomach or small intestine   . Gastric ulcer     years ago  . Arthritis     Back, knees  . Constipation   . Urinary bladder calculus   . Urinary incontinence   . Bowel incontinence   . Aortic stenosis   . Stroke     Mini, no residual   Past Surgical History  Procedure Laterality Date  . Colon resection  50 years ago  . Tonsillectomy    . Uterine fibroid surgery    . Lumbar laminectomy/decompression microdiscectomy  06/25/2011    Procedure: LUMBAR LAMINECTOMY/DECOMPRESSION MICRODISCECTOMY;  Surgeon: Hosie Spangle, MD;  Location: [facility];  Service: Neurosurgery;  Laterality: Right;  RIGHT Lumbar Two-Three hemilaminectomy and microdiskectomy  . Vaginal hysterectomy     Family History  Problem Relation Age of Onset  . Anesthesia problems Neg Hx   . Pancreatic cancer Brother   . Dementia Mother   . Stroke Father    History  Substance Use Topics  . Smoking status: Never Smoker   . Smokeless tobacco: Never Used  .  Alcohol Use: Yes     Comment: rarely   OB History   Grav Para Term Preterm Abortions TAB SAB Ect Mult Living                 Review of Systems  Constitutional: Negative for fever.  Gastrointestinal: Negative for nausea, vomiting and abdominal pain.  Psychiatric/Behavioral: Positive for confusion. Negative for hallucinations.  All other systems reviewed and are negative.     Allergies  Amoxicillin; Baclofen; Hctz; and Valium  Home Medications   Prior to Admission medications   Medication Sig Start Date End Date Taking? Authorizing Provider  furosemide (LASIX) 20 MG tablet Take 1/2 or one whole pill daily for 2 weeks with your potassium 03/06/14   Lanice Shirts, MD  gabapentin (NEURONTIN) 300 MG capsule Take 300-600 mg by mouth See admin instructions. Take 300mg  in the morning, 300mg  at 12, and 2 tablets (600mg ) in the evening. 06/14/13   Lanice Shirts, MD  ibuprofen (ADVIL,MOTRIN) 200 MG tablet Take 200-400 mg by mouth 3 (three) times daily as needed for moderate pain.     Historical Provider, MD  morphine (MSIR) 15 MG tablet Take 15 mg by mouth 2 (two) times daily as needed for severe pain.    Historical Provider, MD  polyethylene glycol (MIRALAX / GLYCOLAX) packet Take 17 g by mouth as needed for mild constipation.  09/24/13   Historical Provider, MD  potassium  chloride SA (K-DUR,KLOR-CON) 20 MEQ tablet Take one tablet when you take your diuretic pill 03/06/14   Lanice Shirts, MD  rOPINIRole (REQUIP) 2 MG tablet TAKE 1-2 TABLETS TWICE DAILY AS NEEDED (RESTLESS LEG).    Lanice Shirts, MD   BP 124/41  Pulse 70  Temp(Src) 97.6 F (36.4 C) (Rectal)  Resp 18  SpO2 94% Physical Exam  Vitals reviewed. Constitutional: She is oriented to person, place, and time. She appears well-developed and well-nourished.  HENT:  Head: Normocephalic and atraumatic.  Right Ear: External ear normal.  Left Ear: External ear normal.  Eyes: Conjunctivae and EOM are normal.  Pupils are equal, round, and reactive to light.  Neck: Normal range of motion. Neck supple.  Cardiovascular: Normal rate, regular rhythm, normal heart sounds and intact distal pulses.   Pulmonary/Chest: Effort normal and breath sounds normal.  Abdominal: Soft. Bowel sounds are normal. There is no tenderness.  Musculoskeletal: Normal range of motion.  Neurological: She is alert and oriented to person, place, and time. She has normal strength and normal reflexes. No cranial nerve deficit or sensory deficit. Gait normal. GCS eye subscore is 4. GCS verbal subscore is 5. GCS motor subscore is 6.  Skin: Skin is warm and dry.    ED Course  Procedures (including critical care time) Labs Review Labs Reviewed  CBC WITH DIFFERENTIAL - Abnormal; Notable for the following:    RBC 3.78 (*)    Hemoglobin 10.8 (*)    HCT 34.2 (*)    All other components within normal limits  COMPREHENSIVE METABOLIC PANEL - Abnormal; Notable for the following:    Glucose, Bld 125 (*)    BUN 43 (*)    Creatinine, Ser 1.17 (*)    GFR calc non Af Amer 39 (*)    GFR calc Af Amer 45 (*)    All other components within normal limits  CBG MONITORING, ED - Abnormal; Notable for the following:    Glucose-Capillary 125 (*)    All other components within normal limits  URINALYSIS, ROUTINE W REFLEX MICROSCOPIC    Imaging Review Dg Chest 2 View  03/16/2014   CLINICAL DATA:  Altered mental status.  Weakness.  EXAM: CHEST  2 VIEW  COMPARISON:  03/02/2014.  FINDINGS: Normal cardiomediastinal silhouette. Clear lung fields. Osteopenia. Glenohumeral degenerative change on the RIGHT. Advanced thoracic spondylosis.  IMPRESSION: No active disease.  Fluid change from priors.   Electronically Signed   By: Rolla Flatten M.D.   On: 03/16/2014 14:18   Ct Head Wo Contrast  03/16/2014   CLINICAL DATA:  Altered mental status. Increased confusion. Disorientation.  EXAM: CT HEAD WITHOUT CONTRAST  TECHNIQUE: Contiguous axial images were obtained  from the base of the skull through the vertex without contrast.  COMPARISON:  CT head 08/26/2013.  FINDINGS: Generalized atrophy with chronic microvascular ischemic change persists. There is no acute stroke or hemorrhage. A 11 mm LEFT anterior falx meningioma is redemonstrated and stable. Calvarium is intact. There is mild chronic sinus disease. No mastoid fluid. Vascular calcification is noted.  IMPRESSION: Stable exam.  No acute intracranial findings.   Electronically Signed   By: Rolla Flatten M.D.   On: 03/16/2014 14:08     EKG Interpretation None      MDM   Final diagnoses:  None    80 y.o. female with pertinent PMH of prior tia, prior confusion secondary urinary tract infection presents with acute onset confusion 3 days ago, lethargy this morning. Patient was  in her normal state of health until Monday, which included complete awareness of date and events and helping to care for self with help of her daughter. She had no confusion at baseline. She then became increasingly confused, requiring frequent reorientation.  This morning the patient continued to be drowsy and required strong stimulation to wake. On arrival to ED vital signs and physical exam as above.  No focal neurodeficits, patient alert and oriented to name, new issues in the hospital but not the particular hospital, and had no idea of the date. She did know the name of the president.  She covers lack of knowledge well, however at times is tangential.  Workup as above unremarkable. No signs of meningitis or encephalitis. No fevers reported. Spoke with her daughter who feels the patient is unsafe to return home due to confusion. She states that this is an abrupt change. Consulted the hospitalist for admission..    Altered mental status    Debby Freiberg, MD 03/16/14 [phone]

## 2014-03-16 NOTE — ED Notes (Signed)
Pt.s daughters work numbers, [id number]

## 2014-03-16 NOTE — ED Notes (Signed)
Dr. Colin Rhein is updating pt.s daughter on plan of care.

## 2014-03-16 NOTE — ED Notes (Addendum)
[facility]- Pt is from home, lives with daughter. Pt has had increased confusion X2 days with some slurred speech per pt's daughter. Pt has been disoriented at home which is unusual. No recent changes in medications. Pt has recently not been taking prescribed diuretic. Pt alert and oriented to person, place, and situation on arrival. Pt confused about time. She is aware of month but unaware of the year.

## 2014-03-17 ENCOUNTER — Observation Stay (HOSPITAL_COMMUNITY): Payer: Medicare Other

## 2014-03-17 DIAGNOSIS — I359 Nonrheumatic aortic valve disorder, unspecified: Secondary | ICD-10-CM

## 2014-03-17 DIAGNOSIS — R55 Syncope and collapse: Secondary | ICD-10-CM

## 2014-03-17 DIAGNOSIS — I350 Nonrheumatic aortic (valve) stenosis: Secondary | ICD-10-CM

## 2014-03-17 LAB — COMPREHENSIVE METABOLIC PANEL
ALBUMIN: 2.9 g/dL — AB (ref 3.5–5.2)
ALK PHOS: 78 U/L (ref 39–117)
ALT: 16 U/L (ref 0–35)
AST: 29 U/L (ref 0–37)
Anion gap: 8 (ref 5–15)
BUN: 33 mg/dL — ABNORMAL HIGH (ref 6–23)
CHLORIDE: 102 meq/L (ref 96–112)
CO2: 30 mEq/L (ref 19–32)
CREATININE: 0.94 mg/dL (ref 0.50–1.10)
Calcium: 8.4 mg/dL (ref 8.4–10.5)
GFR calc Af Amer: 59 mL/min — ABNORMAL LOW (ref >90)
GFR calc non Af Amer: 51 mL/min — ABNORMAL LOW (ref >90)
Glucose, Bld: 90 mg/dL (ref 70–99)
POTASSIUM: 4.3 meq/L (ref 3.7–5.3)
SODIUM: 140 meq/L (ref 137–147)
Total Bilirubin: 0.4 mg/dL (ref 0.3–1.2)
Total Protein: 5.4 g/dL — ABNORMAL LOW (ref 6.0–8.3)

## 2014-03-17 LAB — CBC
HCT: 30.1 % — ABNORMAL LOW (ref 36.0–46.0)
Hemoglobin: 9.6 g/dL — ABNORMAL LOW (ref 12.0–15.0)
MCH: 28.7 pg (ref 26.0–34.0)
MCHC: 31.9 g/dL (ref 30.0–36.0)
MCV: 90.1 fL (ref 78.0–100.0)
Platelets: 209 10*3/uL (ref 150–400)
RBC: 3.34 MIL/uL — ABNORMAL LOW (ref 3.87–5.11)
RDW: 14.2 % (ref 11.5–15.5)
WBC: 4.9 10*3/uL (ref 4.0–10.5)

## 2014-03-17 LAB — TROPONIN I: Troponin I: <0.3 ng/mL (ref <0.30)

## 2014-03-17 LAB — GLUCOSE, CAPILLARY: GLUCOSE-CAPILLARY: 90 mg/dL (ref 70–99)

## 2014-03-17 MED ORDER — BOOST PLUS PO LIQD
237.0000 mL | Freq: Two times a day (BID) | ORAL | Status: DC
  Administered 2014-03-17: 18:00:00 via ORAL
  Administered 2014-03-18 – 2014-03-21 (×3): 237 mL via ORAL
  Filled 2014-03-17 (×12): qty 237

## 2014-03-17 MED ORDER — OXYCODONE HCL 5 MG PO TABS
5.0000 mg | ORAL_TABLET | Freq: Four times a day (QID) | ORAL | Status: DC | PRN
  Administered 2014-03-19 – 2014-03-21 (×5): 5 mg via ORAL
  Filled 2014-03-17 (×5): qty 1

## 2014-03-17 NOTE — Progress Notes (Signed)
Patient ID: Shelly Martin  female  XVQ:[id number]    DOB: 1919/03/26    DOA: 03/16/2014  PCP: Kelton Pillar, MD  Brief history of present illness Shelly Martin is a 80 y.o. female with a past medical history of mild to moderate aortic stenosis, history of TIA, chronic back pain due to spinal stenosis and degenerative disc disease, chronic right hip pain due to severe osteoarthritis, who apparently was in her usual state of health till about 2 days ago, when her daughter noticed that patient was confused, usually oriented. On the morning of admission, patient became argumentative with her daughter. Subsequently patient had a syncopal episode.    Assessment/Plan: Principal Problem:  syncope:  - 2-D echo pending, MRI showed no infarct, continue telemetry - Check orthostatic vitals, to continue gentle hydration  - PTOT evaluation  - Ruled out for acute ACS  Acute encephalopathy: Etiology unclear - MRI of the brain showed no acute infarct - UA negative for UTI - Minimize narcotics   mild dehydration with elevated BUN and creatinine: - Improving, continue to hold Lasix for now, gentle hydration  history of aortic stenosis: This was reported as being mild to moderate previously. - 2-D echo pending    history of chronic low back pain due to spinal stenosis and chronic right hip pain due to severe arthritis:  - Continue with her pain regimen. PT/OT.    history of weight loss:  - TSH normal, defer to outpatient PCP   DVT Prophylaxis: Heparin subcutaneous  Code Status:  Family Communication:  Disposition:  Consultants:  None  Procedures:  MRI brain  Antibiotics:  None    Subjective: Patient seen and examined, present, denies any specific complaints at this time, appears to be oriented, ambulating with walker, afebrile  Objective: Weight change:   Intake/Output Summary (Last 24 hours) at 03/17/14 1143 Last data filed at 03/17/14 1042  Gross per 24  hour  Intake  497.5 ml  Output      0 ml  Net  497.5 ml   Blood pressure 145/88, pulse 79, temperature 97.7 F (36.5 C), temperature source Oral, resp. rate 16, height 5' (1.524 m), weight 50.4 kg (111 lb 1.8 oz), SpO2 99.00%.  Physical Exam: General: Alert and awake, oriented, not in any acute distress. CVS: S1-S2 clear, no murmur rubs or gallops Chest: clear to auscultation bilaterally, no wheezing, rales or rhonchi Abdomen: soft nontender, nondistended, normal bowel sounds  Extremities: no cyanosis, clubbing or edema noted bilaterally Neuro: Cranial nerves II-XII intact, no focal neurological deficits  Lab Results: Basic Metabolic Panel:  Recent Labs Lab 03/16/14 1430 03/17/14 0616  NA 141 140  K 4.6 4.3  CL 101 102  CO2 29 30  GLUCOSE 125* 90  BUN 43* 33*  CREATININE 1.17* 0.94  CALCIUM 9.1 8.4   Liver Function Tests:  Recent Labs Lab 03/16/14 1430 03/17/14 0616  AST 36 29  ALT 18 16  ALKPHOS 94 78  BILITOT 0.4 0.4  PROT 6.8 5.4*  ALBUMIN 3.6 2.9*   No results found for this basename: LIPASE, AMYLASE,  in the last 168 hours No results found for this basename: AMMONIA,  in the last 168 hours CBC:  Recent Labs Lab 03/16/14 1430 03/17/14 0616  WBC 6.1 4.9  NEUTROABS 4.4  --   HGB 10.8* 9.6*  HCT 34.2* 30.1*  MCV 90.5 90.1  PLT 231 209   Cardiac Enzymes:  Recent Labs Lab 03/16/14 1840 03/17/14 0021 03/17/14 0616  TROPONINI <0.30 <0.30 <  0.30   BNP: No components found with this basename: POCBNP,  CBG:  Recent Labs Lab 03/16/14 1423 03/17/14 0640  GLUCAP 125* 90     Micro Results: No results found for this or any previous visit (from the past 240 hour(s)).  Studies/Results: Dg Chest 2 View  03/16/2014   CLINICAL DATA:  Altered mental status.  Weakness.  EXAM: CHEST  2 VIEW  COMPARISON:  03/02/2014.  FINDINGS: Normal cardiomediastinal silhouette. Clear lung fields. Osteopenia. Glenohumeral degenerative change on the RIGHT. Advanced  thoracic spondylosis.  IMPRESSION: No active disease.  Fluid change from priors.   Electronically Signed   By: Rolla Flatten M.D.   On: 03/16/2014 14:18   Dg Chest 2 View  03/02/2014   CLINICAL DATA:  Bilateral foot and ankle swelling for 1 week. No injury. Hypertension.  EXAM: CHEST  2 VIEW  COMPARISON:  10/15/2013  FINDINGS: Moderate thoracic spondylosis. Osteopenia. Right glenohumeral joint osteoarthritis. Mild cardiomegaly. Tortuous thoracic aorta. No pleural effusion or pneumothorax. No congestive failure. Mild volume loss at the left lung base.  IMPRESSION: Cardiomegaly, without congestive failure or acute disease.   Electronically Signed   By: Abigail Miyamoto M.D.   On: 03/02/2014 16:37   Ct Head Wo Contrast  03/16/2014   CLINICAL DATA:  Altered mental status. Increased confusion. Disorientation.  EXAM: CT HEAD WITHOUT CONTRAST  TECHNIQUE: Contiguous axial images were obtained from the base of the skull through the vertex without contrast.  COMPARISON:  CT head 08/26/2013.  FINDINGS: Generalized atrophy with chronic microvascular ischemic change persists. There is no acute stroke or hemorrhage. A 11 mm LEFT anterior falx meningioma is redemonstrated and stable. Calvarium is intact. There is mild chronic sinus disease. No mastoid fluid. Vascular calcification is noted.  IMPRESSION: Stable exam.  No acute intracranial findings.   Electronically Signed   By: Rolla Flatten M.D.   On: 03/16/2014 14:08   Mr Brain Wo Contrast  03/17/2014   CLINICAL DATA:  Acute encephalopathy.  Confusion  EXAM: MRI HEAD WITHOUT CONTRAST  TECHNIQUE: Multiplanar, multiecho pulse sequences of the brain and surrounding structures were obtained without intravenous contrast.  COMPARISON:  MRI 06/24/2012.  CT 03/16/2014  FINDINGS: Generalized atrophy, appropriate for age. Negative for hydrocephalus. Pituitary normal in size. Chronic degenerative changes in the cervical spine.  Left frontal parafalcine meningioma measuring 11 mm  unchanged from prior studies. No edema in the adjacent brain. No other mass lesion.  Negative for acute infarct.  No significant chronic ischemic change.  Negative for hemorrhage or fluid collection. Paranasal sinuses are clear.  IMPRESSION: No acute infarct  Left frontal parafalcine meningioma is stable from prior studies and is likely asymptomatic.   Electronically Signed   By: Franchot Gallo M.D.   On: 03/17/2014 11:36    Medications: Scheduled Meds: . docusate sodium  100 mg Oral BID  . gabapentin  300 mg Oral BID  . gabapentin  600 mg Oral QHS  . heparin  5,000 Units Subcutaneous 3 times per day  . sodium chloride  3 mL Intravenous Q12H      LOS: 1 day   Lauri Purdum M.D. [facility] 03/17/2014, 11:43 AM Pager: [phone]  If 7PM-7AM, please contact night-coverage [email or web address] Password [email or web address]

## 2014-03-17 NOTE — Progress Notes (Signed)
  Echocardiogram 2D Echocardiogram has been performed.  Shelly Martin [name] 03/17/2014, 12:12 PM

## 2014-03-17 NOTE — Evaluation (Signed)
Physical Therapy Evaluation Patient Details Name: Shelly Martin MRN: [id number] DOB: 30-Jan-1919 Today's Date: 03/17/2014   History of Present Illness  Shelly Martin is a 80 y.o. female admitted 03/16/14 for syncopal episode and confusion x2 days. PMH of HTN, arthritis, urinary incontinence, bowel incontinence, aortic stenosis, and CVA.   Clinical Impression  Pt admitted with syncope and AMS. Pt reports she does not currently have 24 hour assist at home (daughter works). Pt currently with functional limitations due to the deficits listed below (see PT Problem List).  Pt will benefit from skilled PT to increase their independence and safety with mobility to allow discharge to the venue listed below.       Follow Up Recommendations SNF;Supervision/Assistance - 24 hour (unless family can provide 24/7 supervision, then could go home with HHPT)    Equipment Recommendations  None recommended by PT    Recommendations for Other Services OT consult     Precautions / Restrictions Precautions Precautions: Fall Restrictions Weight Bearing Restrictions: No      Mobility  Bed Mobility               General bed mobility comments: NT- pt up in recliner on arrival  Transfers Overall transfer level: Needs assistance Equipment used: Rolling walker (2 wheeled) Transfers: Sit to/from Stand Sit to Stand: Min guard         General transfer comment: minguard for safety due to recent syncope; no unsteadiness  Ambulation/Gait Ambulation/Gait assistance: Min guard Ambulation Distance (Feet): 100 Feet Assistive device: Rolling walker (2 wheeled) Gait Pattern/deviations: Step-through pattern;Decreased step length - left;Shuffle;Antalgic     General Gait Details: pt pushes RW too far ahead of her (RW is slightly too tall, however she states this is how she normally uses her RW)  Financial trader [employer] (Stroke Patients Only)        Balance Overall balance assessment: Needs assistance Sitting-balance support: No upper extremity supported;Feet supported Sitting balance-Leahy Scale: Fair     Standing balance support: Bilateral upper extremity supported Standing balance-Leahy Scale: Poor Standing balance comment: bil UE on RW                             Pertinent Vitals/Pain Pain Assessment: 0-10 Pain Score: 8  Pain Location: Lt hip, Lt shoulder Pain Descriptors / Indicators: Aching;Sharp ("sharp aching") Pain Intervention(s): Limited activity within patient's tolerance;Monitored during session;Repositioned    Home Living Family/patient expects to be discharged to:: Private residence Living Arrangements: Children (daughter) Available Help at Discharge: Family;Available PRN/intermittently (daughter works in [employer]; hours vary per pt) Type of Home: House Home Access: Stairs to enter Entrance Stairs-Rails: Right;Left;Can reach both Technical brewer of Steps: [place]: One level Home Equipment: Environmental consultant - 2 wheels;Cane - single point;Bedside commode;Shower seat      Prior Function Level of Independence: Independent with assistive device(s)         Comments: Pt reports that she was independent with use of RW or cane. However, unsure of pt's accuracy and no family present to confirm.      Hand Dominance   Dominant Hand: Right    Extremity/Trunk Assessment   Upper Extremity Assessment: Defer to OT evaluation           Lower Extremity Assessment: Generalized weakness (audible crepitus in Rt hip with transfers)      Cervical / Trunk Assessment:  Kyphotic  Communication   Communication: Expressive difficulties (pt with mild word finding difficulties; occasionally uses in)  Cognition Arousal/Alertness: Awake/alert Behavior During Therapy: WFL for tasks assessed/performed Overall Cognitive Status: No family/caregiver present to determine baseline cognitive functioning                       General Comments General comments (skin integrity, edema, etc.): Pt "wandered off topic" when answering questions. No family present     Exercises        Assessment/Plan    PT Assessment Patient needs continued PT services  PT Diagnosis Generalized weakness   PT Problem List Decreased activity tolerance;Decreased mobility;Decreased knowledge of use of DME;Pain  PT Treatment Interventions DME instruction;Gait training;Stair training;Functional mobility training;Therapeutic activities;Therapeutic exercise;Cognitive remediation;Patient/family education   PT Goals (Current goals can be found in the Care Plan section) Acute Rehab PT Goals Patient Stated Goal: return home PT Goal Formulation: With patient Time For Goal Achievement: 03/24/14 Potential to Achieve Goals: Good    Frequency Min 3X/week   Barriers to discharge Decreased caregiver support      Co-evaluation               End of Session Equipment Utilized During Treatment: Gait belt Activity Tolerance: Patient tolerated treatment well Patient left: in chair;with call bell/phone within reach Nurse Communication: Mobility status    Functional Assessment Tool Used: clinical judgement Functional Limitation: Mobility: Walking and moving around Mobility: Walking and Moving Around Current Status ([id number]): At least 1 percent but less than 20 percent impaired, limited or restricted Mobility: Walking and Moving Around Goal Status [phone]): At least 1 percent but less than 20 percent impaired, limited or restricted Mobility: Walking and Moving Around Discharge Status [phone]): At least 1 percent but less than 20 percent impaired, limited or restricted    Time: 1322-1352 PT Time Calculation (min): 30 min   Charges:   PT Evaluation $Initial PT Evaluation Tier I: 1 Procedure PT Treatments $Gait Training: 8-22 mins   PT G Codes:   Functional Assessment Tool Used: clinical judgement Functional  Limitation: Mobility: Walking and moving around    Graycie Halley 03/17/2014, 4:21 PM Pager [phone]

## 2014-03-17 NOTE — Evaluation (Signed)
Clinical/Bedside Swallow Evaluation Patient Details  Name: Shelly Martin MRN: [id number] Date of Birth: 01-25-1919  Today's Date: 03/17/2014 Time: 6010-9323 SLP Time Calculation (min): 15 min  Past Medical History:  Past Medical History  Diagnosis Date  . Hypertension   . Restless legs syndrome     on Requip  . Ulcer causing bleeding and hole in wall of stomach or small intestine   . Gastric ulcer     years ago  . Arthritis     Back, knees  . Constipation   . Urinary bladder calculus   . Urinary incontinence   . Bowel incontinence   . Aortic stenosis   . Stroke     Mini, no residual   Past Surgical History:  Past Surgical History  Procedure Laterality Date  . Colon resection  50 years ago  . Tonsillectomy    . Uterine fibroid surgery    . Lumbar laminectomy/decompression microdiscectomy  06/25/2011    Procedure: LUMBAR LAMINECTOMY/DECOMPRESSION MICRODISCECTOMY;  Surgeon: Hosie Spangle, MD;  Location: [place] [facility];  Service: Neurosurgery;  Laterality: Right;  RIGHT Lumbar Two-Three hemilaminectomy and microdiskectomy  . Vaginal hysterectomy     HPI:  Shelly Martin is a 80 y.o. female with a past medical history of mild to moderate aortic stenosis, history of TIA, chronic back pain due to spinal stenosis and degenerative disc disease, chronic right hip pain due to severe osteoarthritis, who apparently was in her usual state of health till about 2 days ago, when her daughter noticed that patient was confused. She would not answer appropriately. She would take a long time to answer questions. It would appear to the daughter that the patient was searching for words. However there was no focal weakness. No recent fever or chills. No recent illness. She did get a flu shot last week. She was also recently prescribed Lasix for lower extremity edema. Patient's daughter mentions, that this amount of confusion was quite extraordinary for this patient. Apparently, she was  working up until she was 27. She has been quite independent and was driving up until recently. And this morning patient became argumentative with the daughter. The daughter had to leave the patient in living room to go to the kitchen and when she came back she found the patient lying with her eyes closed on the couch. She could not wake her up for many minutes. EMS was called. When the patient started waking up she was quite confused. She would not recognize the daughter initially. Subsequently patient was brought into the hospital. Subsequently, her mental status improved, but according to the daughter she still remains confused. Patient denies any chest pain or shortness of breath. Denies any nausea, vomiting. She has concerned about her weight loss, which has been ongoing for a year. She's lost about 25-30 pounds. Otherwise, she was unable to provide much information.   Assessment / Plan / Recommendation Clinical Impression  Patient presents with a functional oropharyngeal swallow, with no overt s/s aspiration noted. Patient has had no c/o swallowing difficulty and son who is present confirms this.     Aspiration Risk  None    Diet Recommendation Regular;Thin liquid   Liquid Administration via: Cup;Straw Medication Administration: Whole meds with liquid Supervision: Patient able to self feed    Other  Recommendations     Follow Up Recommendations  None    Frequency and Duration   N/A     Pertinent Vitals/Pain     SLP Swallow Goals  N/A  Swallow Study Prior Functional Status  Type of Home: House Available Help at Discharge: Family;Available PRN/intermittently (daughter works in [employer]; hours vary per pt)    General Date of Onset: 03/16/14 HPI: Shelly Martin is a 80 y.o. female with a past medical history of mild to moderate aortic stenosis, history of TIA, chronic back pain due to spinal stenosis and degenerative disc disease, chronic right hip pain due to severe  osteoarthritis, who apparently was in her usual state of health till about 2 days ago, when her daughter noticed that patient was confused. She would not answer appropriately. She would take a long time to answer questions. It would appear to the daughter that the patient was searching for words. However there was no focal weakness. No recent fever or chills. No recent illness. She did get a flu shot last week. She was also recently prescribed Lasix for lower extremity edema. Patient's daughter mentions, that this amount of confusion was quite extraordinary for this patient. Apparently, she was working up until she was 59. She has been quite independent and was driving up until recently. And this morning patient became argumentative with the daughter. The daughter had to leave the patient in living room to go to the kitchen and when she came back she found the patient lying with her eyes closed on the couch. She could not wake her up for many minutes. EMS was called. When the patient started waking up she was quite confused. She would not recognize the daughter initially. Subsequently patient was brought into the hospital. Subsequently, her mental status improved, but according to the daughter she still remains confused. Patient denies any chest pain or shortness of breath. Denies any nausea, vomiting. She has concerned about her weight loss, which has been ongoing for a year. She's lost about 25-30 pounds. Otherwise, she was unable to provide much information. Type of Study: Bedside swallow evaluation Previous Swallow Assessment: N/A Diet Prior to this Study: Regular;Thin liquids Temperature Spikes Noted: No Respiratory Status: Room air History of Recent Intubation: No Behavior/Cognition: Alert;Cooperative;Pleasant mood;Hard of hearing Oral Cavity - Dentition: Adequate natural dentition Self-Feeding Abilities: Able to feed self Patient Positioning: Upright in chair Baseline Vocal Quality:  Clear Volitional Cough: Strong Volitional Swallow: Able to elicit    Oral/Motor/Sensory Function Overall Oral Motor/Sensory Function: Appears within functional limits for tasks assessed   Ice Chips Ice chips: Not tested   Thin Liquid Thin Liquid: Within functional limits Presentation: Cup;Straw;Self Fed    Nectar Thick Nectar Thick Liquid: Not tested   Honey Thick Honey Thick Liquid: Not tested   Puree Puree: Not tested   Solid   GO    Solid: Within functional limits Presentation: Self Rachel Bo, Inna Tisdell Tarrell 03/17/2014,6:03 PM   Sonia Baller, MA, CCC-SLP [facility] Speech-Language Pathologist

## 2014-03-17 NOTE — Progress Notes (Signed)
PT Cancellation Note  Patient Details Name: Destinee Taber MRN: [id number] DOB: [birth year]   Cancelled Treatment:    Reason Eval/Treat Not Completed: Patient at procedure or test/unavailable   Nobuo Nunziata 03/17/2014, 9:50 AM Pager [phone]

## 2014-03-17 NOTE — Progress Notes (Signed)
INITIAL NUTRITION ASSESSMENT  DOCUMENTATION CODES Per approved criteria  -Not Applicable   INTERVENTION: - Boost Plus BID  NUTRITION DIAGNOSIS: Inadequate oral intake related to confusion as evidenced by poor appetite.   Goal: Pt to meet >/= 90% of their estimated nutrition needs   Monitor:  Weight trend, po intake, acceptance of supplements, labs  Reason for Assessment: MST  80 y.o. female  Admitting Dx: Syncope  ASSESSMENT: 80 y.o. female with a past medical history of mild to moderate aortic stenosis, history of TIA, chronic back pain due to spinal stenosis and degenerative disc disease, chronic right hip pain due to severe osteoarthritis, who apparently was in her usual state of health till about 2 days ago, when her daughter noticed that patient was confused.   - Pt reports a poor appetite prior to admission. She says that is has improved since she has been in the hospital. Meal completion is 50% at this time. She was drinking Boost at home until she ran out and reports that she plans to purchase more.  - Pt with no signs of fat or muscle wasting at this time.   Labs: Na and K WNL Bun elevated Albumin low  Height: Ht Readings from Last 1 Encounters:  03/16/14 5' (1.524 m)    Weight: Wt Readings from Last 1 Encounters:  03/17/14 111 lb 1.8 oz (50.4 kg)    Ideal Body Weight: 45.5 kg  % Ideal Body Weight: 111%  Wt Readings from Last 10 Encounters:  03/17/14 111 lb 1.8 oz (50.4 kg)  03/06/14 106 lb (48.081 kg)  09/26/13 116 lb (52.617 kg)  09/07/13 111 lb (50.349 kg)  08/29/13 112 lb 8 oz (51.03 kg)  08/22/13 110 lb (49.896 kg)  01/04/13 114 lb (51.71 kg)  11/02/12 117 lb (53.071 kg)  10/05/12 117 lb (53.071 kg)  08/18/12 118 lb 12.8 oz (53.887 kg)    BMI:  Body mass index is 21.7 kg/(m^2).  Estimated Nutritional Needs: Kcal: 1300-1500 Protein: 70-80 g Fluid: >1.5 L/day  Skin: intact  Diet Order: Cardiac  EDUCATION NEEDS: -Education needs  addressed   Intake/Output Summary (Last 24 hours) at 03/17/14 1440 Last data filed at 03/17/14 1356  Gross per 24 hour  Intake  737.5 ml  Output      0 ml  Net  737.5 ml    Last BM: prior to admission   Labs:   Recent Labs Lab 03/16/14 1430 03/17/14 0616  NA 141 140  K 4.6 4.3  CL 101 102  CO2 29 30  BUN 43* 33*  CREATININE 1.17* 0.94  CALCIUM 9.1 8.4  GLUCOSE 125* 90    CBG (last 3)   Recent Labs  03/16/14 1423 03/17/14 0640  GLUCAP 125* 90    Scheduled Meds: . docusate sodium  100 mg Oral BID  . gabapentin  300 mg Oral BID  . gabapentin  600 mg Oral QHS  . heparin  5,000 Units Subcutaneous 3 times per day  . sodium chloride  3 mL Intravenous Q12H    Continuous Infusions:   Past Medical History  Diagnosis Date  . Hypertension   . Restless legs syndrome     on Requip  . Ulcer causing bleeding and hole in wall of stomach or small intestine   . Gastric ulcer     years ago  . Arthritis     Back, knees  . Constipation   . Urinary bladder calculus   . Urinary incontinence   . Bowel  incontinence   . Aortic stenosis   . Stroke     Mini, no residual    Past Surgical History  Procedure Laterality Date  . Colon resection  50 years ago  . Tonsillectomy    . Uterine fibroid surgery    . Lumbar laminectomy/decompression microdiscectomy  06/25/2011    Procedure: LUMBAR LAMINECTOMY/DECOMPRESSION MICRODISCECTOMY;  Surgeon: Hosie Spangle, MD;  Location: [place] [facility];  Service: Neurosurgery;  Laterality: Right;  RIGHT Lumbar Two-Three hemilaminectomy and microdiskectomy  . Vaginal hysterectomy      Laurette Schimke RD, LDN

## 2014-03-17 NOTE — Progress Notes (Signed)
03/17/14 1500  OT Visit Information  Last OT Received On 03/17/14  Assistance Needed +1  History of Present Illness Shelly Martin is a 80 y.o. female admitted 03/16/14 for syncopal episode and confusion x2 days. PMH of HTN, arthritis, urinary incontinence, bowel incontinence, aortic stenosis, and CVA.   Precautions  Precautions Fall  Restrictions  Weight Bearing Restrictions No  Home Living  Family/patient expects to be discharged to: Private residence  Living Arrangements Children (daughter)  Available Help at Discharge Family;Available PRN/intermittently  Type of Home House  Home Access Stairs to enter  Entrance Stairs-Number of Steps 4  Entrance Stairs-Rails Right;Left;Can reach both  Home Layout One level  Bathroom Shower/Tub Walk-in shower  Bathroom Toilet Handicapped height  Home Equipment [place] - 2 wheels;Cane - single point;BSC;Shower seat  Prior Function  Level of Independence Independent with assistive device(s)  Comments Pt reports that she was independent with use of RW or cane. However, unsure of pt's accuracy and no family present to confirm.   Communication  Communication Expressive difficulties (pt with word finding difficulties; reports this is new)  Pain Assessment  Pain Assessment 0-10  Pain Score 10  Pain Location Lt posterior hip; Right side (shoulder, hip, knee)  Pain Descriptors / Indicators Aching;Sharp ("sharp aching")  Pain Intervention(s) Limited activity within patient's tolerance;Monitored during session;Repositioned;Heat applied  Cognition  Arousal/Alertness Awake/alert  Behavior During Therapy WFL for tasks assessed/performed  Overall Cognitive Status No family/caregiver present to determine baseline cognitive functioning  Upper Extremity Assessment  Upper Extremity Assessment Generalized weakness  Lower Extremity Assessment  Lower Extremity Assessment Generalized weakness  Cervical / Trunk Assessment  Cervical / Trunk Assessment  Kyphotic  ADL  Overall ADL's  Needs assistance/impaired  Eating/Feeding Independent;Sitting  Grooming Set up;Sitting  Upper Body Bathing Set up;Sitting  Lower Body Bathing Minimal assistance;Sit to/from stand  Upper Body Dressing  Set up;Sitting  Lower Body Dressing Moderate assistance;Sit to/from Environmental education officer guard;Stand-pivot;RW (from recliner >bed)  Functional mobility during ADLs Min guard;Rolling walker  General ADL Comments Pt limited by generalized weakness.   Vision- History  Patient Visual Report No change from baseline  Perception  Perception Tested? No  Praxis  Praxis tested? WFL  Bed Mobility  Overal bed mobility Needs Assistance  Bed Mobility Sit to Supine  Sit to supine Supervision  General bed mobility comments Supervision for safety. HOB flat.   Transfers  Overall transfer level Needs assistance  Equipment used Rolling walker (2 wheeled)  Transfers Sit to/from [facility]  Sit to Stand Min guard  Stand pivot transfers Min guard  General transfer comment Min guard for safety, particularly during stand-pivot.   Balance  Overall balance assessment Needs assistance  Sitting-balance support No upper extremity supported;Feet supported  Sitting balance-Leahy Scale Fair  Standing balance support Bilateral upper extremity supported;During functional activity  Standing balance-Leahy Scale Poor  Standing balance comment bil UE on RW  General Comments  General comments (skin integrity, edema, etc.) Pt demonstrated some word finding difficulties and said the wrong word at times ("Boy I am really salivating" when referring to her difficulty finding the right word). However, pt oriented and aware of situation.   OT - End of Session  Equipment Utilized During Treatment Gait belt;Rolling walker  Activity Tolerance Patient tolerated treatment well  Patient left in bed;with call bell/phone within reach;with bed alarm set  Nurse Communication  Mobility status  OT Assessment  OT Therapy Diagnosis  Generalized weakness;Cognitive deficits  OT Recommendation/Assessment Patient needs continued  OT Services  OT Problem List Decreased strength;Decreased activity tolerance;Impaired balance (sitting and/or standing);Decreased cognition;Decreased safety awareness;Pain  OT Plan  OT Frequency Min 2X/week  OT Treatment/Interventions Self-care/ADL training;Energy conservation;DME and/or AE instruction;Therapeutic activities;Patient/family education;Balance training;Cognitive remediation/compensation  OT Recommendation  Follow Up Recommendations SNF;Supervision/Assistance - 24 hour (unless family can provide 24/7 care, then will need HHOT)  OT Equipment None recommended by OT  Individuals Consulted  Consulted and Agree with Results and Recommendations Patient  Acute Rehab OT Goals  Patient Stated Goal to find out what's wrong  OT Goal Formulation With patient  Time For Goal Achievement 03/31/14  Potential to Achieve Goals Good  OT Time Calculation  OT Start Time 1455  OT Stop Time 1540  OT Time Calculation (min) 45 min  OT G-codes **NOT FOR INPATIENT CLASS**  Functional Assessment Tool Used clinical judgement  Functional Limitation Self care  Self Care Current Status (J8250) CI  Self Care Goal Status (N3976) [place]  OT General Charges  $OT Visit 1 Procedure  OT Evaluation  $Initial OT Evaluation Tier I 1 Procedure  OT Treatments  $Self Care/Home Management  23-37 mins  $Therapeutic Activity 8-22 mins  Written Expression  Dominant Hand Right    Cyndie Chime, OTR/L Occupational Therapist [phone] (pager)

## 2014-03-17 NOTE — Progress Notes (Signed)
UR completed 

## 2014-03-17 NOTE — Progress Notes (Signed)
OT Cancellation Note  Patient Details Name: Shelly Martin MRN: [id number] DOB: [birth year]   Cancelled Treatment:    Reason Eval/Treat Not Completed: Patient at procedure or test/ unavailable. OT will follow up as available to complete evaluation.   Villa Herb M  Cyndie Chime, OTR/L Occupational Therapist [phone] (pager)  03/17/2014, 11:48 AM

## 2014-03-18 DIAGNOSIS — M545 Low back pain: Secondary | ICD-10-CM

## 2014-03-18 LAB — URINALYSIS, ROUTINE W REFLEX MICROSCOPIC
Bilirubin Urine: NEGATIVE
GLUCOSE, UA: NEGATIVE mg/dL
Hgb urine dipstick: NEGATIVE
KETONES UR: NEGATIVE mg/dL
Leukocytes, UA: NEGATIVE
Nitrite: NEGATIVE
PH: 7.5 (ref 5.0–8.0)
PROTEIN: NEGATIVE mg/dL
Specific Gravity, Urine: 1.008 (ref 1.005–1.030)
Urobilinogen, UA: 0.2 mg/dL (ref 0.0–1.0)

## 2014-03-18 LAB — GLUCOSE, CAPILLARY: GLUCOSE-CAPILLARY: 99 mg/dL (ref 70–99)

## 2014-03-18 MED ORDER — HYDRALAZINE HCL 20 MG/ML IJ SOLN
10.0000 mg | Freq: Four times a day (QID) | INTRAMUSCULAR | Status: DC | PRN
  Administered 2014-03-18: 10 mg via INTRAVENOUS
  Filled 2014-03-18: qty 1

## 2014-03-18 MED ORDER — AMLODIPINE BESYLATE 5 MG PO TABS
5.0000 mg | ORAL_TABLET | Freq: Every day | ORAL | Status: DC
  Administered 2014-03-18: 5 mg via ORAL
  Filled 2014-03-18: qty 1

## 2014-03-18 MED ORDER — FESOTERODINE FUMARATE ER 4 MG PO TB24
4.0000 mg | ORAL_TABLET | Freq: Every day | ORAL | Status: DC
  Administered 2014-03-18: 4 mg via ORAL
  Filled 2014-03-18 (×2): qty 1

## 2014-03-18 NOTE — Progress Notes (Addendum)
Patient ID: Shelly Martin  female  ONG:[id number]    DOB: 01-05-1919    DOA: 03/16/2014  PCP: Kelton Pillar, MD  Brief history of present illness Shelly Martin is a 80 y.o. female with a past medical history of mild to moderate aortic stenosis, history of TIA, chronic back pain due to spinal stenosis and degenerative disc disease, chronic right hip pain due to severe osteoarthritis, who apparently was in her usual state of health till about 2 days ago, when her daughter noticed that patient was confused, usually oriented. On the morning of admission, patient became argumentative with her daughter. Subsequently patient had a syncopal episode.    Assessment/Plan: Principal Problem:  syncope: Likely vasovagal, ruled out ACS - 2-D echo showed EF of 65%, left ventricle diastolic dysfunction, no regional wall motion abnormality  -MRI showed no infarct, continue telemetry -Continue BP control - PTOT evaluation -> SNF  Essential hypertension - Placed on Norvasc, hydralazine as needed  Acute encephalopathy: Etiology unclear - MRI of the brain showed no acute infarct - UA negative for UTI - Minimize narcotics   mild dehydration with elevated BUN and creatinine: - improved  history of aortic stenosis: This was reported as being mild to moderate previously. - 2-D echo showed moderate AS    history of chronic low back pain due to spinal stenosis and chronic right hip pain due to severe arthritis:  - Continue with her pain regimen. PT/OT.    history of weight loss:  - TSH normal, defer to outpatient PCP   Generalized debility:  - PT, OT rec SNF  Urinary incontinence - UA negative for UTI, start toviaz   DVT Prophylaxis: Heparin subcutaneous  Code Status:  Family Communication: called pt's daughter on [phone] but unable to make contact  Disposition:  Consultants:  None  Procedures:  MRI brain  echo  Antibiotics:  None    Subjective: Patient seen  and examined, present, states had a rough night with urinary frequency   Objective: Weight change: -0.14 kg (-4.9 oz)  Intake/Output Summary (Last 24 hours) at 03/18/14 0944 Last data filed at 03/17/14 2217  Gross per 24 hour  Intake    603 ml  Output      0 ml  Net    603 ml   Blood pressure 195/68, pulse 70, temperature 98.1 F (36.7 C), temperature source Oral, resp. rate 16, height 5' (1.524 m), weight 50.3 kg (110 lb 14.3 oz), SpO2 99.00%.  Physical Exam: General: Alert and awake, oriented, NAD S: S1-S2 clear, no murmur rubs or gallops Chest: clear to auscultation bilaterally, no wheezing, rales or rhonchi Abdomen: soft nontender, nondistended, normal bowel sounds  Extremities: no cyanosis, clubbing or edema noted bilaterally Neuro: Cranial nerves II-XII intact, no focal neurological deficits  Lab Results: Basic Metabolic Panel:  Recent Labs Lab 03/16/14 1430 03/17/14 0616  NA 141 140  K 4.6 4.3  CL 101 102  CO2 29 30  GLUCOSE 125* 90  BUN 43* 33*  CREATININE 1.17* 0.94  CALCIUM 9.1 8.4   Liver Function Tests:  Recent Labs Lab 03/16/14 1430 03/17/14 0616  AST 36 29  ALT 18 16  ALKPHOS 94 78  BILITOT 0.4 0.4  PROT 6.8 5.4*  ALBUMIN 3.6 2.9*   No results found for this basename: LIPASE, AMYLASE,  in the last 168 hours No results found for this basename: AMMONIA,  in the last 168 hours CBC:  Recent Labs Lab 03/16/14 1430 03/17/14 0616  WBC 6.1 4.9  NEUTROABS 4.4  --   HGB 10.8* 9.6*  HCT 34.2* 30.1*  MCV 90.5 90.1  PLT 231 209   Cardiac Enzymes:  Recent Labs Lab 03/16/14 1840 03/17/14 0021 03/17/14 0616  TROPONINI <0.30 <0.30 <0.30   BNP: No components found with this basename: POCBNP,  CBG:  Recent Labs Lab 03/16/14 1423 03/17/14 0640 03/18/14 0701  GLUCAP 125* 90 99     Micro Results: No results found for this or any previous visit (from the past 240 hour(s)).  Studies/Results: Dg Chest 2 View  03/16/2014   CLINICAL  DATA:  Altered mental status.  Weakness.  EXAM: CHEST  2 VIEW  COMPARISON:  03/02/2014.  FINDINGS: Normal cardiomediastinal silhouette. Clear lung fields. Osteopenia. Glenohumeral degenerative change on the RIGHT. Advanced thoracic spondylosis.  IMPRESSION: No active disease.  Fluid change from priors.   Electronically Signed   By: Rolla Flatten M.D.   On: 03/16/2014 14:18   Dg Chest 2 View  03/02/2014   CLINICAL DATA:  Bilateral foot and ankle swelling for 1 week. No injury. Hypertension.  EXAM: CHEST  2 VIEW  COMPARISON:  10/15/2013  FINDINGS: Moderate thoracic spondylosis. Osteopenia. Right glenohumeral joint osteoarthritis. Mild cardiomegaly. Tortuous thoracic aorta. No pleural effusion or pneumothorax. No congestive failure. Mild volume loss at the left lung base.  IMPRESSION: Cardiomegaly, without congestive failure or acute disease.   Electronically Signed   By: Abigail Miyamoto M.D.   On: 03/02/2014 16:37   Ct Head Wo Contrast  03/16/2014   CLINICAL DATA:  Altered mental status. Increased confusion. Disorientation.  EXAM: CT HEAD WITHOUT CONTRAST  TECHNIQUE: Contiguous axial images were obtained from the base of the skull through the vertex without contrast.  COMPARISON:  CT head 08/26/2013.  FINDINGS: Generalized atrophy with chronic microvascular ischemic change persists. There is no acute stroke or hemorrhage. A 11 mm LEFT anterior falx meningioma is redemonstrated and stable. Calvarium is intact. There is mild chronic sinus disease. No mastoid fluid. Vascular calcification is noted.  IMPRESSION: Stable exam.  No acute intracranial findings.   Electronically Signed   By: Rolla Flatten M.D.   On: 03/16/2014 14:08   Mr Brain Wo Contrast  03/17/2014   CLINICAL DATA:  Acute encephalopathy.  Confusion  EXAM: MRI HEAD WITHOUT CONTRAST  TECHNIQUE: Multiplanar, multiecho pulse sequences of the brain and surrounding structures were obtained without intravenous contrast.  COMPARISON:  MRI 06/24/2012.  CT  03/16/2014  FINDINGS: Generalized atrophy, appropriate for age. Negative for hydrocephalus. Pituitary normal in size. Chronic degenerative changes in the cervical spine.  Left frontal parafalcine meningioma measuring 11 mm unchanged from prior studies. No edema in the adjacent brain. No other mass lesion.  Negative for acute infarct.  No significant chronic ischemic change.  Negative for hemorrhage or fluid collection. Paranasal sinuses are clear.  IMPRESSION: No acute infarct  Left frontal parafalcine meningioma is stable from prior studies and is likely asymptomatic.   Electronically Signed   By: Franchot Gallo M.D.   On: 03/17/2014 11:36    Medications: Scheduled Meds: . amLODipine  5 mg Oral Daily  . docusate sodium  100 mg Oral BID  . gabapentin  300 mg Oral BID  . gabapentin  600 mg Oral QHS  . heparin  5,000 Units Subcutaneous 3 times per day  . lactose free nutrition  237 mL Oral BID BM  . sodium chloride  3 mL Intravenous Q12H      LOS: 2 days   RAI,RIPUDEEP M.D. [facility]  03/18/2014, 9:44 AM Pager: [phone]  If 7PM-7AM, please contact night-coverage [email or web address] Password [email or web address]

## 2014-03-18 NOTE — Progress Notes (Signed)
UR completed 

## 2014-03-19 DIAGNOSIS — I10 Essential (primary) hypertension: Secondary | ICD-10-CM

## 2014-03-19 LAB — URINE CULTURE
CULTURE: NO GROWTH
Colony Count: NO GROWTH

## 2014-03-19 LAB — BASIC METABOLIC PANEL
ANION GAP: 7 (ref 5–15)
BUN: 23 mg/dL (ref 6–23)
CALCIUM: 8.6 mg/dL (ref 8.4–10.5)
CO2: 32 meq/L (ref 19–32)
Chloride: 103 mEq/L (ref 96–112)
Creatinine, Ser: 0.92 mg/dL (ref 0.50–1.10)
GFR, EST AFRICAN AMERICAN: 60 mL/min — AB (ref >90)
GFR, EST NON AFRICAN AMERICAN: 52 mL/min — AB (ref >90)
Glucose, Bld: 103 mg/dL — ABNORMAL HIGH (ref 70–99)
Potassium: 4.3 mEq/L (ref 3.7–5.3)
SODIUM: 142 meq/L (ref 137–147)

## 2014-03-19 LAB — CBC
HCT: 32.5 % — ABNORMAL LOW (ref 36.0–46.0)
Hemoglobin: 10.4 g/dL — ABNORMAL LOW (ref 12.0–15.0)
MCH: 28.7 pg (ref 26.0–34.0)
MCHC: 32 g/dL (ref 30.0–36.0)
MCV: 89.5 fL (ref 78.0–100.0)
Platelets: 234 10*3/uL (ref 150–400)
RBC: 3.63 MIL/uL — AB (ref 3.87–5.11)
RDW: 14.1 % (ref 11.5–15.5)
WBC: 6.2 10*3/uL (ref 4.0–10.5)

## 2014-03-19 LAB — GLUCOSE, CAPILLARY: GLUCOSE-CAPILLARY: 103 mg/dL — AB (ref 70–99)

## 2014-03-19 MED ORDER — AMLODIPINE BESYLATE 10 MG PO TABS
10.0000 mg | ORAL_TABLET | Freq: Every day | ORAL | Status: DC
  Administered 2014-03-19 – 2014-03-21 (×3): 10 mg via ORAL
  Filled 2014-03-19 (×3): qty 1

## 2014-03-19 MED ORDER — HALOPERIDOL 1 MG PO TABS
0.5000 mg | ORAL_TABLET | Freq: Three times a day (TID) | ORAL | Status: DC | PRN
  Administered 2014-03-20: 1 mg via ORAL
  Filled 2014-03-19: qty 1

## 2014-03-19 MED ORDER — FESOTERODINE FUMARATE ER 8 MG PO TB24
8.0000 mg | ORAL_TABLET | Freq: Every day | ORAL | Status: DC
  Administered 2014-03-19 – 2014-03-21 (×3): 8 mg via ORAL
  Filled 2014-03-19 (×3): qty 1

## 2014-03-19 NOTE — Progress Notes (Addendum)
Patient increasingly anxious; wanted to see her son before he left for [place]; hasn't heard from her daughter; c/o pain in right hip and leg; has tried prn pain medications this afternoon; getting upset; cursed; have notified MD; await follow up for anxiety.

## 2014-03-19 NOTE — Progress Notes (Signed)
Utilization Review Completed.   Roni Friberg, RN, BSN Nurse Case Manager  

## 2014-03-19 NOTE — Progress Notes (Signed)
Patient ID: Shelly Martin  female  EQA:[id number]    DOB: 24-Jun-1918    DOA: 03/16/2014  PCP: Kelton Pillar, MD  Brief history of present illness Shelly Martin is a 80 y.o. female with a past medical history of mild to moderate aortic stenosis, history of TIA, chronic back pain due to spinal stenosis and degenerative disc disease, chronic right hip pain due to severe osteoarthritis, who apparently was in her usual state of health till about 2 days ago, when her daughter noticed that patient was confused, usually oriented. On the morning of admission, patient became argumentative with her daughter. Subsequently patient had a syncopal episode.    Assessment/Plan: Principal Problem:  Syncope: Likely vasovagal, ruled out ACS - 2-D echo showed EF of 65%, left ventricle diastolic dysfunction, no regional wall motion abnormality  - MRI showed no infarct, patient stable, no cardiac arrhythmias, DC telemetry - Continue BP control, increase Norvasc to 10 mg daily - PTOT evaluation -> SNF  Essential hypertension - Placed on Norvasc, increase to 10 mg daily, hydralazine as needed  Acute encephalopathy: Etiology unclear - MRI of the brain showed no acute infarct - UA negative for UTI - Minimize narcotics   mild dehydration with elevated BUN and creatinine: - improved  history of aortic stenosis: This was reported as being mild to moderate previously. - 2-D echo showed moderate AS    history of chronic low back pain due to spinal stenosis and chronic right hip pain due to severe arthritis:  - Continue with her pain regimen. PT/OT.    history of weight loss:  - TSH normal, defer to outpatient PCP   Generalized debility:  - PT, OT rec SNF  Urinary incontinence - UA negative for UTI, start toviaz   DVT Prophylaxis: Heparin subcutaneous  Code Status: Full code  Family Communication: called pt's daughter and discussed in detail   Disposition: needs SNF, social work  consult   Consultants:  None  Procedures:  MRI brain  echo  Antibiotics:  None    Subjective: Patient seen and examined, still having some urinary incontinence otherwise no acute issues  Objective: Weight change: -2.3 kg (-5 lb 1.1 oz)  Intake/Output Summary (Last 24 hours) at 03/19/14 0952 Last data filed at 03/19/14 0700  Gross per 24 hour  Intake   2109 ml  Output      0 ml  Net   2109 ml   Blood pressure 157/66, pulse 73, temperature 98.1 F (36.7 C), temperature source Oral, resp. rate 16, height 5' (1.524 m), weight 48 kg (105 lb 13.1 oz), SpO2 99.00%.  Physical Exam: General: Alert and awake, oriented, NAD S: S1-S2 clear, no murmur rubs or gallops Chest: CTAB Abdomen: soft nontender, nondistended, normal bowel sounds  Extremities: no cyanosis, clubbing or edema noted bilaterally Neuro: Cranial nerves II-XII intact, no focal neurological deficits  Lab Results: Basic Metabolic Panel:  Recent Labs Lab 03/17/14 0616 03/19/14 0510  NA 140 142  K 4.3 4.3  CL 102 103  CO2 30 32  GLUCOSE 90 103*  BUN 33* 23  CREATININE 0.94 0.92  CALCIUM 8.4 8.6   Liver Function Tests:  Recent Labs Lab 03/16/14 1430 03/17/14 0616  AST 36 29  ALT 18 16  ALKPHOS 94 78  BILITOT 0.4 0.4  PROT 6.8 5.4*  ALBUMIN 3.6 2.9*   No results found for this basename: LIPASE, AMYLASE,  in the last 168 hours No results found for this basename: AMMONIA,  in the last 168  hours CBC:  Recent Labs Lab 03/16/14 1430 03/17/14 0616 03/19/14 0510  WBC 6.1 4.9 6.2  NEUTROABS 4.4  --   --   HGB 10.8* 9.6* 10.4*  HCT 34.2* 30.1* 32.5*  MCV 90.5 90.1 89.5  PLT 231 209 234   Cardiac Enzymes:  Recent Labs Lab 03/16/14 1840 03/17/14 0021 03/17/14 0616  TROPONINI <0.30 <0.30 <0.30   BNP: No components found with this basename: POCBNP,  CBG:  Recent Labs Lab 03/16/14 1423 03/17/14 0640 03/18/14 0701 03/19/14 0649  GLUCAP 125* 90 99 103*     Micro Results: No  results found for this or any previous visit (from the past 240 hour(s)).  Studies/Results: Dg Chest 2 View  03/16/2014   CLINICAL DATA:  Altered mental status.  Weakness.  EXAM: CHEST  2 VIEW  COMPARISON:  03/02/2014.  FINDINGS: Normal cardiomediastinal silhouette. Clear lung fields. Osteopenia. Glenohumeral degenerative change on the RIGHT. Advanced thoracic spondylosis.  IMPRESSION: No active disease.  Fluid change from priors.   Electronically Signed   By: Rolla Flatten M.D.   On: 03/16/2014 14:18   Dg Chest 2 View  03/02/2014   CLINICAL DATA:  Bilateral foot and ankle swelling for 1 week. No injury. Hypertension.  EXAM: CHEST  2 VIEW  COMPARISON:  10/15/2013  FINDINGS: Moderate thoracic spondylosis. Osteopenia. Right glenohumeral joint osteoarthritis. Mild cardiomegaly. Tortuous thoracic aorta. No pleural effusion or pneumothorax. No congestive failure. Mild volume loss at the left lung base.  IMPRESSION: Cardiomegaly, without congestive failure or acute disease.   Electronically Signed   By: Abigail Miyamoto M.D.   On: 03/02/2014 16:37   Ct Head Wo Contrast  03/16/2014   CLINICAL DATA:  Altered mental status. Increased confusion. Disorientation.  EXAM: CT HEAD WITHOUT CONTRAST  TECHNIQUE: Contiguous axial images were obtained from the base of the skull through the vertex without contrast.  COMPARISON:  CT head 08/26/2013.  FINDINGS: Generalized atrophy with chronic microvascular ischemic change persists. There is no acute stroke or hemorrhage. A 11 mm LEFT anterior falx meningioma is redemonstrated and stable. Calvarium is intact. There is mild chronic sinus disease. No mastoid fluid. Vascular calcification is noted.  IMPRESSION: Stable exam.  No acute intracranial findings.   Electronically Signed   By: Rolla Flatten M.D.   On: 03/16/2014 14:08   Mr Brain Wo Contrast  03/17/2014   CLINICAL DATA:  Acute encephalopathy.  Confusion  EXAM: MRI HEAD WITHOUT CONTRAST  TECHNIQUE: Multiplanar, multiecho  pulse sequences of the brain and surrounding structures were obtained without intravenous contrast.  COMPARISON:  MRI 06/24/2012.  CT 03/16/2014  FINDINGS: Generalized atrophy, appropriate for age. Negative for hydrocephalus. Pituitary normal in size. Chronic degenerative changes in the cervical spine.  Left frontal parafalcine meningioma measuring 11 mm unchanged from prior studies. No edema in the adjacent brain. No other mass lesion.  Negative for acute infarct.  No significant chronic ischemic change.  Negative for hemorrhage or fluid collection. Paranasal sinuses are clear.  IMPRESSION: No acute infarct  Left frontal parafalcine meningioma is stable from prior studies and is likely asymptomatic.   Electronically Signed   By: Franchot Gallo M.D.   On: 03/17/2014 11:36    Medications: Scheduled Meds: . amLODipine  5 mg Oral Daily  . docusate sodium  100 mg Oral BID  . fesoterodine  8 mg Oral Daily  . gabapentin  300 mg Oral BID  . gabapentin  600 mg Oral QHS  . heparin  5,000 Units Subcutaneous 3 times  per day  . lactose free nutrition  237 mL Oral BID BM  . sodium chloride  3 mL Intravenous Q12H      LOS: 3 days   Laverle Pillard M.D. [facility] 03/19/2014, 9:52 AM Pager: [phone]  If 7PM-7AM, please contact night-coverage [email or web address] Password [email or web address]

## 2014-03-20 LAB — GLUCOSE, CAPILLARY: Glucose-Capillary: 101 mg/dL — ABNORMAL HIGH (ref 70–99)

## 2014-03-20 MED ORDER — ACETAMINOPHEN 325 MG PO TABS
650.0000 mg | ORAL_TABLET | Freq: Three times a day (TID) | ORAL | Status: DC
  Administered 2014-03-20 – 2014-03-21 (×3): 650 mg via ORAL
  Filled 2014-03-20 (×3): qty 2

## 2014-03-20 MED ORDER — LOSARTAN POTASSIUM 50 MG PO TABS
50.0000 mg | ORAL_TABLET | Freq: Every day | ORAL | Status: DC
  Administered 2014-03-20 – 2014-03-21 (×2): 50 mg via ORAL
  Filled 2014-03-20 (×2): qty 1

## 2014-03-20 NOTE — Clinical Social Work Psychosocial (Signed)
Clinical Social Work Department BRIEF PSYCHOSOCIAL ASSESSMENT 03/20/2014  Patient:  Shelly Martin,Shelly Martin     Account Number:  [id number]     Admit date:  03/16/2014  Clinical Social Worker:  ,, LCSWA  Date/Time:  03/20/2014 09:02 AM  Referred by:  RN  Date Referred:  03/20/2014 Referred for  SNF Placement   Other Referral:   Interview type:  Patient Other interview type:    PSYCHOSOCIAL DATA Living Status:  ALONE Admitted from facility:   Level of care:   Primary support name:  Jan Wallace Primary support relationship to patient:  CHILD, ADULT Degree of support available:   Strong Support System    CURRENT CONCERNS  Other Concerns:    SOCIAL WORK ASSESSMENT / PLAN CSW met the pt at bedside. CSW introduced self and purpose of the visit. The pt provided the CSW an overview regarding the event which led her to her hospitalization. The pt was receptive to receiving rehab. Pt reported that she received rehab a year ago and understands the process. CSW and pt reviewed the SNF process together.  Pt reported that she will review the SNF list with her daughter this morning. CSW provided the pt with contact information for further questions. CSW will continue to follow this pt and assist with discharge as needed.   Assessment/plan status:  Psychosocial Support/Ongoing Assessment of Needs Other assessment/ plan:   Information/referral to community resources:    PATIENT'S/FAMILY'S RESPONSE TO PLAN OF CARE: The pt present with a pleasant affect and calm mood. It appears the pt understand her condition well and welcomed the clinical team's recommendations. The pt provided great insight into acknowledging her age and the additional care she may need.    , MSW, LCSWA [phone]     

## 2014-03-20 NOTE — Progress Notes (Signed)
Patient ID: Shelly Martin  female  IWP:[id number]    DOB: February 06, 1919    DOA: 03/16/2014  PCP: Kelton Pillar, MD  Brief history of present illness Shelly Martin is a 80 y.o. female with a past medical history of mild to moderate aortic stenosis, history of TIA, chronic back pain due to spinal stenosis and degenerative disc disease, chronic right hip pain due to severe osteoarthritis, who apparently was in her usual state of health till about 2 days ago, when her daughter noticed that patient was confused, usually oriented. On the morning of admission, patient became argumentative with her daughter. Subsequently patient had a syncopal episode.    Assessment/Plan: Principal Problem:  Syncope: Likely vasovagal, ruled out ACS - 2-D echo showed EF of 65%, left ventricle diastolic dysfunction, no regional wall motion abnormality  - MRI showed no infarct, patient stable, no cardiac arrhythmias, DC telemetry - Continue BP control, increase Norvasc to 10 mg daily - PTOT evaluation -> SNF  Essential hypertension - Placed on Norvasc, increase to 10 mg daily - Added Cozaar, hydralazine as needed  Acute encephalopathy: Etiology unclear - MRI of the brain showed no acute infarct - UA negative for UTI - Minimize narcotics   mild dehydration with elevated BUN and creatinine: - improved  history of aortic stenosis: This was reported as being mild to moderate previously. - 2-D echo showed moderate AS    history of chronic low back pain due to spinal stenosis and chronic right hip pain due to severe arthritis:  - Continue with her pain regimen. PT/OT.    history of weight loss:  - TSH normal, defer to outpatient PCP   Generalized debility:  - PT, OT rec SNF  Urinary incontinence - UA negative for UTI, started toviaz   DVT Prophylaxis: Heparin subcutaneous  Code Status: Full code  Family Communication: called pt's daughter and discussed in detail yesterday  Disposition:  needs SNF, social work consult   Consultants:  None  Procedures:  MRI brain  echo  Antibiotics:  None    Subjective: Patient seen and examined, complaining of chronic low back pain otherwise no acute issues  Objective: Weight change: 0.5 kg (1 lb 1.6 oz)  Intake/Output Summary (Last 24 hours) at 03/20/14 1254 Last data filed at 03/20/14 1234  Gross per 24 hour  Intake    360 ml  Output      0 ml  Net    360 ml   Blood pressure 155/50, pulse 60, temperature 98.3 F (36.8 C), temperature source Oral, resp. rate 18, height 5' (1.524 m), weight 48.5 kg (106 lb 14.8 oz), SpO2 98.00%.  Physical Exam: General: Alert and awake, oriented, NAD S: S1-S2 clear, no murmur rubs or gallops Chest: CTAB Abdomen: soft nontender, nondistended, normal bowel sounds  Extremities: no cyanosis, clubbing or edema noted bilaterally   Lab Results: Basic Metabolic Panel:  Recent Labs Lab 03/17/14 0616 03/19/14 0510  NA 140 142  K 4.3 4.3  CL 102 103  CO2 30 32  GLUCOSE 90 103*  BUN 33* 23  CREATININE 0.94 0.92  CALCIUM 8.4 8.6   Liver Function Tests:  Recent Labs Lab 03/16/14 1430 03/17/14 0616  AST 36 29  ALT 18 16  ALKPHOS 94 78  BILITOT 0.4 0.4  PROT 6.8 5.4*  ALBUMIN 3.6 2.9*   No results found for this basename: LIPASE, AMYLASE,  in the last 168 hours No results found for this basename: AMMONIA,  in the last 168 hours CBC:  Recent Labs Lab 03/16/14 1430 03/17/14 0616 03/19/14 0510  WBC 6.1 4.9 6.2  NEUTROABS 4.4  --   --   HGB 10.8* 9.6* 10.4*  HCT 34.2* 30.1* 32.5*  MCV 90.5 90.1 89.5  PLT 231 209 234   Cardiac Enzymes:  Recent Labs Lab 03/16/14 1840 03/17/14 0021 03/17/14 0616  TROPONINI <0.30 <0.30 <0.30   BNP: No components found with this basename: POCBNP,  CBG:  Recent Labs Lab 03/16/14 1423 03/17/14 0640 03/18/14 0701 03/19/14 0649 03/20/14 0623  GLUCAP 125* 90 99 103* 101*     Micro Results: Recent Results (from the past  240 hour(s))  URINE CULTURE     Status: None   Collection Time    03/18/14  8:22 AM      Result Value Ref Range Status   Specimen Description URINE, CLEAN CATCH   Final   Special Requests NONE   Final   Culture  Setup Time     Final   Value: 03/18/2014 10:56     Performed at [facility]     Final   Value: NO GROWTH     Performed at [facility]   Culture     Final   Value: NO GROWTH     Performed at [facility]   Report Status 03/19/2014 FINAL   Final    Studies/Results: Dg Chest 2 View  03/16/2014   CLINICAL DATA:  Altered mental status.  Weakness.  EXAM: CHEST  2 VIEW  COMPARISON:  03/02/2014.  FINDINGS: Normal cardiomediastinal silhouette. Clear lung fields. Osteopenia. Glenohumeral degenerative change on the RIGHT. Advanced thoracic spondylosis.  IMPRESSION: No active disease.  Fluid change from priors.   Electronically Signed   By: Rolla Flatten M.D.   On: 03/16/2014 14:18   Dg Chest 2 View  03/02/2014   CLINICAL DATA:  Bilateral foot and ankle swelling for 1 week. No injury. Hypertension.  EXAM: CHEST  2 VIEW  COMPARISON:  10/15/2013  FINDINGS: Moderate thoracic spondylosis. Osteopenia. Right glenohumeral joint osteoarthritis. Mild cardiomegaly. Tortuous thoracic aorta. No pleural effusion or pneumothorax. No congestive failure. Mild volume loss at the left lung base.  IMPRESSION: Cardiomegaly, without congestive failure or acute disease.   Electronically Signed   By: Abigail Miyamoto M.D.   On: 03/02/2014 16:37   Ct Head Wo Contrast  03/16/2014   CLINICAL DATA:  Altered mental status. Increased confusion. Disorientation.  EXAM: CT HEAD WITHOUT CONTRAST  TECHNIQUE: Contiguous axial images were obtained from the base of the skull through the vertex without contrast.  COMPARISON:  CT head 08/26/2013.  FINDINGS: Generalized atrophy with chronic microvascular ischemic change persists. There is no acute stroke or hemorrhage. A 11 mm LEFT anterior falx  meningioma is redemonstrated and stable. Calvarium is intact. There is mild chronic sinus disease. No mastoid fluid. Vascular calcification is noted.  IMPRESSION: Stable exam.  No acute intracranial findings.   Electronically Signed   By: Rolla Flatten M.D.   On: 03/16/2014 14:08   Mr Brain Wo Contrast  03/17/2014   CLINICAL DATA:  Acute encephalopathy.  Confusion  EXAM: MRI HEAD WITHOUT CONTRAST  TECHNIQUE: Multiplanar, multiecho pulse sequences of the brain and surrounding structures were obtained without intravenous contrast.  COMPARISON:  MRI 06/24/2012.  CT 03/16/2014  FINDINGS: Generalized atrophy, appropriate for age. Negative for hydrocephalus. Pituitary normal in size. Chronic degenerative changes in the cervical spine.  Left frontal parafalcine meningioma measuring 11 mm unchanged from prior studies.  No edema in the adjacent brain. No other mass lesion.  Negative for acute infarct.  No significant chronic ischemic change.  Negative for hemorrhage or fluid collection. Paranasal sinuses are clear.  IMPRESSION: No acute infarct  Left frontal parafalcine meningioma is stable from prior studies and is likely asymptomatic.   Electronically Signed   By: Franchot Gallo M.D.   On: 03/17/2014 11:36    Medications: Scheduled Meds: . amLODipine  10 mg Oral Daily  . docusate sodium  100 mg Oral BID  . fesoterodine  8 mg Oral Daily  . gabapentin  300 mg Oral BID  . gabapentin  600 mg Oral QHS  . heparin  5,000 Units Subcutaneous 3 times per day  . lactose free nutrition  237 mL Oral BID BM  . sodium chloride  3 mL Intravenous Q12H      LOS: 4 days   Linsey Hirota M.D. [facility] 03/20/2014, 12:54 PM Pager: [phone]  If 7PM-7AM, please contact night-coverage [email or web address] Password [email or web address]

## 2014-03-20 NOTE — Progress Notes (Signed)
UR completed 

## 2014-03-20 NOTE — Clinical Social Work Placement (Addendum)
Clinical Social Work Department CLINICAL SOCIAL WORK PLACEMENT NOTE 03/20/2014  Patient:  Shelly Martin, Shelly Martin  Account Number:  [id number] [place] date:  03/16/2014  Clinical Social Worker:  Greta Doom, LCSWA  Date/time:  03/20/2014 09:08 AM  Clinical Social Work is seeking post-discharge placement for this patient at the following level of care:   SKILLED NURSING   (*CSW will update this form in Epic as items are completed)   03/20/2014  Patient/family provided with [place] Department of Clinical Social Work's list of facilities offering this level of care within the geographic area requested by the patient (or if unable, by the patient's family).  03/20/2014  Patient/family informed of their freedom to choose among providers that offer the needed level of care, that participate in Medicare, Medicaid or managed care program needed by the patient, have an available bed and are willing to accept the patient.  03/20/2014  Patient/family informed of [facility]' ownership interest in [facility], as well as of the fact that they are under no obligation to receive care at this facility.  PASARR submitted to EDS on  PASARR number received on   FL2 transmitted to all facilities in geographic area requested by pt/family on  03/20/2014 FL2 transmitted to all facilities within larger geographic area on 03/20/2014  Patient informed that his/her managed care company has contracts with or will negotiate with  certain facilities, including the following:     Patient/family informed of bed offers received: 03/20/2014  Patient chooses bed at [facility]  Physician recommends and patient chooses bed at    Patient to be transferred to [facility] on 03/21/2014  Patient to be transferred to facility by [facility] Patient and family notified of transfer on 03/21/2014 Name of family member notified:  Pt reported that she will call her daughter [name]   The following physician request were  entered in Epic:   Additional Comments: Pt has a pervious [employer], MSW, [place]

## 2014-03-20 NOTE — Progress Notes (Signed)
PT Cancellation Note  Patient Details Name: Shelly Martin MRN: [id number] DOB: [birth year]   Cancelled Treatment:    Reason Eval/Treat Not Completed: Patient declined, no reason specified. Polite yet refused any activity offered. RN reported she had ativan awhile ago and finally seems comfortable after being in pain.   Sadira Standard 03/20/2014, 4:54 PM Pager [phone]

## 2014-03-20 NOTE — Progress Notes (Signed)
OT Cancellation Note  Patient Details Name: Shelly Martin MRN: [id number] DOB: 21-May-1919   Cancelled Treatment:    Reason Eval/Treat Not Completed: Patient at procedure or test/ unavailable . Pt states "i am sorry I dont normally say no but i am saying no not today."   Peri Maris Pager: [phone]  03/20/2014, 3:26 PM

## 2014-03-21 LAB — GLUCOSE, CAPILLARY: GLUCOSE-CAPILLARY: 103 mg/dL — AB (ref 70–99)

## 2014-03-21 MED ORDER — HYDROMORPHONE HCL 2 MG PO TABS: 4.0000 mg | ORAL_TABLET | Freq: Four times a day (QID) | ORAL | Status: DC | PRN

## 2014-03-21 MED ORDER — ACETAMINOPHEN 325 MG PO TABS: 650.0000 mg | ORAL_TABLET | Freq: Three times a day (TID) | ORAL | Status: DC

## 2014-03-21 MED ORDER — BOOST PLUS PO LIQD: 237.0000 mL | Freq: Two times a day (BID) | ORAL | Status: DC

## 2014-03-21 MED ORDER — FUROSEMIDE 20 MG PO TABS
20.0000 mg | ORAL_TABLET | Freq: Every day | ORAL | Status: DC
  Administered 2014-03-21: 20 mg via ORAL
  Filled 2014-03-21: qty 1

## 2014-03-21 MED ORDER — LOSARTAN POTASSIUM 50 MG PO TABS: 50.0000 mg | ORAL_TABLET | Freq: Every day | ORAL | Status: DC

## 2014-03-21 MED ORDER — AMLODIPINE BESYLATE 10 MG PO TABS: 10.0000 mg | ORAL_TABLET | Freq: Every day | ORAL | Status: DC

## 2014-03-21 MED ORDER — MORPHINE SULFATE 15 MG PO TABS: 15.0000 mg | ORAL_TABLET | Freq: Three times a day (TID) | ORAL | Status: DC | PRN

## 2014-03-21 MED ORDER — ROPINIROLE HCL 1 MG PO TABS: 4.0000 mg | ORAL_TABLET | Freq: Every day | ORAL | Status: DC

## 2014-03-21 MED ORDER — FESOTERODINE FUMARATE ER 8 MG PO TB24: 8.0000 mg | ORAL_TABLET | Freq: Every day | ORAL | Status: DC

## 2014-03-21 MED ORDER — DSS 100 MG PO CAPS: 100.0000 mg | ORAL_CAPSULE | Freq: Two times a day (BID) | ORAL | Status: DC

## 2014-03-21 NOTE — Progress Notes (Signed)
Physical Therapy Treatment Patient Details Name: Shelly Martin MRN: [id number] DOB: 1919-02-21 Today's Date: 03/21/2014    History of Present Illness Shelly Martin is a 80 y.o. female admitted 03/16/14 for syncopal episode and confusion x2 days. PMH of HTN, arthritis, urinary incontinence, bowel incontinence, aortic stenosis, and CVA.     PT Comments    Patient is very sweet and willing to work with therapy today. Patient stated that yesterday was a very bad day but today has been much better. Patient was able to walk up hallway and back with no physical assist. Patient would benefit from youth size RW next visit or when [facility] to SNF. Will continue with current POC  Follow Up Recommendations  SNF;Supervision/Assistance - 24 hour     Equipment Recommendations  None recommended by PT    Recommendations for Other Services       Precautions / Restrictions Precautions Precautions: Fall    Mobility  Bed Mobility Overal bed mobility: Modified Independent                Transfers Overall transfer level: Needs assistance Equipment used: Rolling walker (2 wheeled)   Sit to Stand: Min guard         General transfer comment: minguard for safety ; no unsteadiness; safe technique  Ambulation/Gait Ambulation/Gait assistance: Min guard Ambulation Distance (Feet): 140 Feet Assistive device: Rolling walker (2 wheeled) Gait Pattern/deviations: Step-through pattern;Decreased stride length     General Gait Details: Patient continues to keep RW too far out in front. unable to adjust this specific walker lower but she may benefit from a youth size RW   Stairs            Wheelchair Mobility    Modified Rankin (Stroke Patients Only)       Balance                                    Cognition Arousal/Alertness: Awake/alert Behavior During Therapy: WFL for tasks assessed/performed Overall Cognitive Status: No family/caregiver present to  determine baseline cognitive functioning                      Exercises      General Comments        Pertinent Vitals/Pain Pain Assessment: No/denies pain    Home Living                      Prior Function            PT Goals (current goals can now be found in the care plan section) Progress towards PT goals: Progressing toward goals    Frequency  Min 2X/week    PT Plan Current plan remains appropriate    Co-evaluation             End of Session Equipment Utilized During Treatment: Gait belt Activity Tolerance: Patient tolerated treatment well Patient left: with call bell/phone within reach;in bed     Time: 1343-1356 PT Time Calculation (min): 13 min  Charges:  $Gait Training: 8-22 mins                    G Codes:      Jacqualyn Posey 03/21/2014, 2:10 PM 03/21/2014 Jacqualyn Posey PTA [phone] pager [phone] office

## 2014-03-21 NOTE — Discharge Summary (Signed)
Physician Discharge Summary  Patient ID: Shelly Martin MRN: [id number] DOB/AGE: 10/18/1918 80 y.o.  Admit date: 03/16/2014 Discharge date: 03/21/2014  Primary Care Physician:  Kelton Pillar, MD  Discharge Diagnoses:    . Syncope . Acute encephalopathy resolved  . Chronic back pain . Spinal stenosis, lumbar . Osteoarthritis of hip . Chronic right hip pain . Avascular necrosis of bone of right hip . BPPV (benign paroxysmal positional vertigo)  Consults:  None   Recommendations for Outpatient Follow-up:  Ongoing BP control by PCP  Allergies:   Allergies  Allergen Reactions  . Amoxicillin Other (See Comments)    Reaction unknown  . Baclofen Other (See Comments)    "fuzzy in the eyes" and dizzy  . Hctz [Hydrochlorothiazide]     nausea  . Valium [Diazepam] Other (See Comments)    Pt became unresponsive and O2 sats dropped.     Discharge Medications:   Medication List    STOP taking these medications       HYDROmorphone 4 MG tablet  Commonly known as:  DILAUDID      TAKE these medications       acetaminophen 325 MG tablet  Commonly known as:  TYLENOL  Take 2 tablets (650 mg total) by mouth 3 (three) times daily.     amLODipine 10 MG tablet  Commonly known as:  NORVASC  Take 1 tablet (10 mg total) by mouth daily.     DSS 100 MG Caps  Take 100 mg by mouth 2 (two) times daily.     fesoterodine 8 MG Tb24 tablet  Commonly known as:  TOVIAZ  Take 1 tablet (8 mg total) by mouth daily.     furosemide 20 MG tablet  Commonly known as:  LASIX  Take 20 mg by mouth daily. Take with Klor-Con     gabapentin 300 MG capsule  Commonly known as:  NEURONTIN  Take 300-600 mg by mouth See admin instructions. Take 300mg  in the morning, 300mg  at 12, and 2 tablets (600mg ) in the evening.     ibuprofen 200 MG tablet  Commonly known as:  ADVIL,MOTRIN  Take 200-400 mg by mouth 3 (three) times daily as needed for moderate pain.     lactose free nutrition Liqd   Take 237 mLs by mouth 2 (two) times daily between meals.     losartan 50 MG tablet  Commonly known as:  COZAAR  Take 1 tablet (50 mg total) by mouth daily.     morphine 15 MG tablet  Commonly known as:  MSIR  Take 1 tablet (15 mg total) by mouth every 8 (eight) hours as needed for severe pain.     polyethylene glycol packet  Commonly known as:  MIRALAX / GLYCOLAX  Take 17 g by mouth as needed for mild constipation.     potassium chloride SA 20 MEQ tablet  Commonly known as:  K-DUR,KLOR-CON  Take one tablet when you take your diuretic pill     rOPINIRole 2 MG tablet  Commonly known as:  REQUIP  Take 4 mg by mouth at bedtime.         Brief H and P: For complete details please refer to admission H and P, but in brieBeatrice Martin is a 80 y.o. female with a past medical history of mild to moderate aortic stenosis, history of TIA, chronic back pain due to spinal stenosis and degenerative disc disease, chronic right hip pain due to severe osteoarthritis, who apparently was in her usual state of health  till about 2 days ago, when her daughter noticed that patient was confused. She would not answer appropriately. She would take a long time to answer questions. It would appear to the daughter that the patient was searching for words. However there was no focal weakness. No recent fever or chills. No recent illness. She did get a flu shot last week. She was also recently prescribed Lasix for lower extremity edema. Patient's daughter mentions, that this amount of confusion was quite extraordinary for this patient. Apparently, she was working up until she was 53. She has been quite independent and was driving up until recently. And this morning patient became argumentative with the daughter. The daughter had to leave the patient in living room to go to the kitchen and when she came back she found the patient lying with her eyes closed on the couch. She could not wake her up for many minutes. EMS  was called. When the patient started waking up she was quite confused. She would not recognize the daughter initially. Subsequently patient was brought into the hospital. Subsequently, her mental status improved, but according to the daughter she still remains confused. Patient denies any chest pain or shortness of breath. Denies any nausea, vomiting. She has concerned about her weight loss, which has been ongoing for a year. She's lost about 25-30 pounds. Otherwise, she was unable to provide much information   Hospital Course:  Shelly Martin is a 80 y.o. female with a past medical history of mild to moderate aortic stenosis, history of TIA, chronic back pain due to spinal stenosis and degenerative disc disease, chronic right hip pain due to severe osteoarthritis, who apparently was in her usual state of health till about 2 days ago, when her daughter noticed that patient was confused, usually oriented. On the morning of admission, patient became argumentative with her daughter. Subsequently patient had a syncopal episode.  Syncope: Likely vasovagal, ruled out ACS  - 2-D echo showed EF of 65%, left ventricle diastolic dysfunction, no regional wall motion abnormality  - MRI brain showed no acute CVA infarct, patient stable, no cardiac arrhythmias - Continue BP control - PTOT evaluation recommended SNF   Essential hypertension somewhat uncontrolled - Placed on Norvasc, increase to 10 mg daily, Cozaar, Lasix   Acute encephalopathy: Etiology unclear  - MRI of the brain showed no acute infarct  - UA negative for UTI  - Minimize narcotics   mild dehydration with elevated BUN and creatinine: Improved with hydration  history of aortic stenosis: This was reported as being mild to moderate previously.  - 2-D echo showed moderate AS   history of chronic low back pain due to spinal stenosis and chronic right hip pain due to severe arthritis:  - Continue with her pain regimen. PT/OT.   history of  weight loss:  - TSH normal, defer to outpatient PCP   Generalized debility:  - PT, OT rec SNF   Urinary incontinence  - UA negative for UTI, started toviaz     Day of Discharge BP 175/64  Pulse 77  Temp(Src) 98 F (36.7 C) (Oral)  Resp 16  Ht 5' (1.524 m)  Wt 48.5 kg (106 lb 14.8 oz)  BMI 20.88 kg/m2  SpO2 97%  Physical Exam: General: Alert and awake oriented x3 not in any acute distress. CVS: S1-S2 clear no murmur rubs or gallops Chest: clear to auscultation bilaterally, no wheezing rales or rhonchi Abdomen: soft nontender, nondistended, normal bowel sounds Extremities: no cyanosis, clubbing or edema  noted bilaterally Neuro: Cranial nerves II-XII intact, no focal neurological deficits   The results of significant diagnostics from this hospitalization (including imaging, microbiology, ancillary and laboratory) are listed below for reference.    LAB RESULTS: Basic Metabolic Panel:  Recent Labs Lab 03/17/14 0616 03/19/14 0510  NA 140 142  K 4.3 4.3  CL 102 103  CO2 30 32  GLUCOSE 90 103*  BUN 33* 23  CREATININE 0.94 0.92  CALCIUM 8.4 8.6   Liver Function Tests:  Recent Labs Lab 03/16/14 1430 03/17/14 0616  AST 36 29  ALT 18 16  ALKPHOS 94 78  BILITOT 0.4 0.4  PROT 6.8 5.4*  ALBUMIN 3.6 2.9*   No results found for this basename: LIPASE, AMYLASE,  in the last 168 hours No results found for this basename: AMMONIA,  in the last 168 hours CBC:  Recent Labs Lab 03/16/14 1430 03/17/14 0616 03/19/14 0510  WBC 6.1 4.9 6.2  NEUTROABS 4.4  --   --   HGB 10.8* 9.6* 10.4*  HCT 34.2* 30.1* 32.5*  MCV 90.5 90.1 89.5  PLT 231 209 234   Cardiac Enzymes:  Recent Labs Lab 03/17/14 0021 03/17/14 0616  TROPONINI <0.30 <0.30   BNP: No components found with this basename: POCBNP,  CBG:  Recent Labs Lab 03/20/14 0623 03/21/14 0709  GLUCAP 101* 103*    Significant Diagnostic Studies:  Dg Chest 2 View  03/16/2014   CLINICAL DATA:  Altered mental  status.  Weakness.  EXAM: CHEST  2 VIEW  COMPARISON:  03/02/2014.  FINDINGS: Normal cardiomediastinal silhouette. Clear lung fields. Osteopenia. Glenohumeral degenerative change on the RIGHT. Advanced thoracic spondylosis.  IMPRESSION: No active disease.  Fluid change from priors.   Electronically Signed   By: Rolla Flatten M.D.   On: 03/16/2014 14:18   Ct Head Wo Contrast  03/16/2014   CLINICAL DATA:  Altered mental status. Increased confusion. Disorientation.  EXAM: CT HEAD WITHOUT CONTRAST  TECHNIQUE: Contiguous axial images were obtained from the base of the skull through the vertex without contrast.  COMPARISON:  CT head 08/26/2013.  FINDINGS: Generalized atrophy with chronic microvascular ischemic change persists. There is no acute stroke or hemorrhage. A 11 mm LEFT anterior falx meningioma is redemonstrated and stable. Calvarium is intact. There is mild chronic sinus disease. No mastoid fluid. Vascular calcification is noted.  IMPRESSION: Stable exam.  No acute intracranial findings.   Electronically Signed   By: Rolla Flatten M.D.   On: 03/16/2014 14:08   Mr Brain Wo Contrast  03/17/2014   CLINICAL DATA:  Acute encephalopathy.  Confusion  EXAM: MRI HEAD WITHOUT CONTRAST  TECHNIQUE: Multiplanar, multiecho pulse sequences of the brain and surrounding structures were obtained without intravenous contrast.  COMPARISON:  MRI 06/24/2012.  CT 03/16/2014  FINDINGS: Generalized atrophy, appropriate for age. Negative for hydrocephalus. Pituitary normal in size. Chronic degenerative changes in the cervical spine.  Left frontal parafalcine meningioma measuring 11 mm unchanged from prior studies. No edema in the adjacent brain. No other mass lesion.  Negative for acute infarct.  No significant chronic ischemic change.  Negative for hemorrhage or fluid collection. Paranasal sinuses are clear.  IMPRESSION: No acute infarct  Left frontal parafalcine meningioma is stable from prior studies and is likely asymptomatic.    Electronically Signed   By: Franchot Gallo M.D.   On: 03/17/2014 11:36    2D ECHO: Study Conclusions  - Left ventricle: The cavity size was normal. Wall thickness was increased in a pattern of  moderate LVH. The estimated ejection fraction was 65%. Wall motion was normal; there were no regional wall motion abnormalities. Findings consistent with left ventricular diastolic dysfunction. - Aortic valve: Calcified leaflets. Moderate AS. Mean gradient (S): 22 mm Hg. Peak gradient (S): 38 mm Hg. - Mitral valve: Calcified annulus. - Pulmonary arteries: PA peak pressure: 34 mm Hg (S).    Disposition and Follow-up:     Discharge Instructions   Diet - low sodium heart healthy    Complete by:  As directed      Increase activity slowly    Complete by:  As directed             DISPOSITION: skilled nursing facility  DIET: Heart healthy    DISCHARGE FOLLOW-UP Follow-up Information   Follow up with SCHOENHOFF,DEBBIE, MD. Schedule an appointment as soon as possible for a visit in 2 weeks. (for hospital follow-up)    Specialty:  Internal Medicine   Contact information:   [place] [id number] [phone]       Time spent on Discharge: 40 mins  Signed:   Arabel Barcenas M.D. [facility] 03/21/2014, 12:37 PM Pager: [phone]

## 2014-03-21 NOTE — Progress Notes (Signed)
Pt doing well. Report called to Mardene Celeste, LPN at [facility]. Pt's IV was removed prior to D/C. Pt D/C'd to SNF via ambulance @ 1640 per MD order. Pt's belongings were packed and sent with Pt @ D/C. Pt is stable @ D/C and has no other needs at this time. Holli Humbles, RN

## 2014-03-22 ENCOUNTER — Encounter: Payer: Self-pay | Admitting: Adult Health

## 2014-03-22 ENCOUNTER — Other Ambulatory Visit: Payer: Self-pay | Admitting: *Deleted

## 2014-03-22 ENCOUNTER — Non-Acute Institutional Stay (SKILLED_NURSING_FACILITY): Payer: Medicare Other | Admitting: Adult Health

## 2014-03-22 DIAGNOSIS — G8929 Other chronic pain: Secondary | ICD-10-CM

## 2014-03-22 DIAGNOSIS — K5900 Constipation, unspecified: Secondary | ICD-10-CM

## 2014-03-22 DIAGNOSIS — G2581 Restless legs syndrome: Secondary | ICD-10-CM

## 2014-03-22 DIAGNOSIS — M549 Dorsalgia, unspecified: Secondary | ICD-10-CM

## 2014-03-22 DIAGNOSIS — R55 Syncope and collapse: Secondary | ICD-10-CM

## 2014-03-22 DIAGNOSIS — I10 Essential (primary) hypertension: Secondary | ICD-10-CM

## 2014-03-22 DIAGNOSIS — N3941 Urge incontinence: Secondary | ICD-10-CM

## 2014-03-22 MED ORDER — OXYCODONE HCL 5 MG PO TABS: ORAL_TABLET | ORAL | Status: DC

## 2014-03-22 NOTE — Progress Notes (Signed)
03/17/14 1803  SLP G-Codes **NOT FOR INPATIENT CLASS**  Functional Assessment Tool Used (clnical judgement)  Functional Limitations Swallowing  Swallow Current Status (Q5672) [place]  Swallow Goal Status (S9198) [facility]  Swallow Discharge Status (K2217) [facility]  SLP Evaluations  $ SLP Speech Visit 1 Procedure  SLP Evaluations  $BSS Swallow 1 Procedure

## 2014-03-22 NOTE — Telephone Encounter (Signed)
[facility] 

## 2014-03-23 ENCOUNTER — Non-Acute Institutional Stay (SKILLED_NURSING_FACILITY): Payer: Medicare Other | Admitting: Internal Medicine

## 2014-03-23 DIAGNOSIS — M4806 Spinal stenosis, lumbar region: Secondary | ICD-10-CM

## 2014-03-23 DIAGNOSIS — M48062 Spinal stenosis, lumbar region with neurogenic claudication: Secondary | ICD-10-CM

## 2014-03-23 DIAGNOSIS — I10 Essential (primary) hypertension: Secondary | ICD-10-CM

## 2014-03-23 DIAGNOSIS — E876 Hypokalemia: Secondary | ICD-10-CM

## 2014-03-23 DIAGNOSIS — K5900 Constipation, unspecified: Secondary | ICD-10-CM

## 2014-03-25 DIAGNOSIS — E876 Hypokalemia: Secondary | ICD-10-CM | POA: Insufficient documentation

## 2014-03-25 NOTE — Progress Notes (Signed)
HISTORY & PHYSICAL  DATE: 03/23/2014   FACILITY: [facility]  LEVEL OF CARE: SNF (31)  ALLERGIES:  Allergies  Allergen Reactions  . Amoxicillin Other (See Comments)    Reaction unknown  . Baclofen Other (See Comments)    "fuzzy in the eyes" and dizzy  . Hctz [Hydrochlorothiazide]     nausea  . Valium [Diazepam] Other (See Comments)    Pt became unresponsive and O2 sats dropped.    CHIEF COMPLAINT:  Manage lumbar spinal stenosis, HTN & hypokalemia  HISTORY OF PRESENT ILLNESS: [name] was hospitalized secondary to confusion & syncope.  After hospitalization she is admitted to this facility for short term rehabilitation.  SPINAL STENOSIS: Patient's spinal stenoses remains stable. Patient denies ongoing numbness, tingling or weakness, but c/o LBP. No complications reported from the medications currently being used.  HTN: Pt 's HTN remains stable.  Denies CP, sob, DOE, pedal edema, headaches, dizziness or visual disturbances.  No complications from the medications currently being used.  Last BP :130/59.  HYPOKALEMIA: The patient's hypokalemia remains stable. Patient denies muscle cramping or palpitations. No complications reported from current potassium supplementation.  PAST MEDICAL HISTORY :  Past Medical History  Diagnosis Date  . Hypertension   . Restless legs syndrome     on Requip  . Ulcer causing bleeding and hole in wall of stomach or small intestine   . Gastric ulcer     years ago  . Arthritis     Back, knees  . Constipation   . Urinary bladder calculus   . Urinary incontinence   . Bowel incontinence   . Aortic stenosis   . Stroke     Mini, no residual    PAST SURGICAL HISTORY: Past Surgical History  Procedure Laterality Date  . Colon resection  50 years ago  . Tonsillectomy    . Uterine fibroid surgery    . Lumbar laminectomy/decompression microdiscectomy  06/25/2011    Procedure: LUMBAR  LAMINECTOMY/DECOMPRESSION MICRODISCECTOMY;  Surgeon: Hosie Spangle, MD;  Location: [facility];  Service: Neurosurgery;  Laterality: Right;  RIGHT Lumbar Two-Three hemilaminectomy and microdiskectomy  . Vaginal hysterectomy      SOCIAL HISTORY:  reports that she has never smoked. She has never used smokeless tobacco. She reports that she drinks alcohol. She reports that she does not use illicit drugs.  FAMILY HISTORY:  Family History  Problem Relation Age of Onset  . Anesthesia problems Neg Hx   . Pancreatic cancer Brother   . Dementia Mother   . Stroke Father     CURRENT MEDICATIONS: Reviewed per MAR/see medication list  REVIEW OF SYSTEMS:  See HPI otherwise 14 point ROS is negative.  PHYSICAL EXAMINATION  VS:  See VS section  GENERAL: no acute distress, normal body habitus EYES: conjunctivae normal, sclerae normal, normal eye lids MOUTH/THROAT: lips without lesions,no lesions in the mouth,tongue is without lesions,uvula elevates in midline NECK: supple, trachea midline, no neck masses, no thyroid tenderness, no thyromegaly LYMPHATICS: no LAN in the neck, no supraclavicular LAN RESPIRATORY: breathing is even & unlabored, BS CTAB CARDIAC: RRR, no murmur,no extra heart sounds, no edema GI:  ABDOMEN: abdomen soft, normal BS, no masses, no tenderness  LIVER/SPLEEN: no hepatomegaly, no splenomegaly MUSCULOSKELETAL: HEAD: normal to inspection  EXTREMITIES: LEFT UPPER EXTREMITY: full range of motion, normal strength & tone RIGHT UPPER EXTREMITY: moderate range of motion, normal strength & tone LEFT LOWER EXTREMITY:  full range  of motion, normal strength & tone RIGHT LOWER EXTREMITY: moderate range of motion, normal strength & tone PSYCHIATRIC: the patient is alert & oriented to person, affect & behavior appropriate  LABS/RADIOLOGY:  Labs reviewed: Basic Metabolic Panel:  Recent Labs  03/16/14 1430 03/17/14 0616 03/19/14 0510  NA 141 140 142  K 4.6 4.3 4.3  CL 101  102 103  CO2 29 30 32  GLUCOSE 125* 90 103*  BUN 43* 33* 23  CREATININE 1.17* 0.94 0.92  CALCIUM 9.1 8.4 8.6   Liver Function Tests:  Recent Labs  03/02/14 1557 03/16/14 1430 03/17/14 0616  AST 19 36 29  ALT 14 18 16   ALKPHOS 78 94 78  BILITOT 0.4 0.4 0.4  PROT 5.8* 6.8 5.4*  ALBUMIN 3.1* 3.6 2.9*   CBC:  Recent Labs  10/15/13 1725 03/02/14 1557 03/16/14 1430 03/17/14 0616 03/19/14 0510  WBC 7.0 6.4 6.1 4.9 6.2  NEUTROABS 5.0 4.6 4.4  --   --   HGB 11.4* 10.4* 10.8* 9.6* 10.4*  HCT 34.6* 32.8* 34.2* 30.1* 32.5*  MCV 89.2 90.9 90.5 90.1 89.5  PLT 211 228 231 209 234   Lipid Panel:  Recent Labs  03/06/14 1329  HDL 61   Cardiac Enzymes:  Recent Labs  03/16/14 1840 03/17/14 0021 03/17/14 0616  TROPONINI <0.30 <0.30 <0.30   CBG:  Recent Labs  03/19/14 0649 03/20/14 0623 03/21/14 0709  GLUCAP 103* 101* 103*    Bilateral Lower Extremity Venous Duplex Evaluation  Patient:    Shelly Martin, Shelly Martin MR #:       [id number] Study Date: 03/02/2014 Gender:     F Age:        63 Height: Weight: BSA: Pt. Status: Room:   ATTENDING    Shelly Martin [id number]  SONOGRAPHER  Toma Copier, [place], [place]  Reports also to:  ------------------------------------------------------------------- History and indications:  Indications  729.81 Swelling of limb.  History  Diagnostic evaluation.  Swelling of both lower extremities.   ------------------------------------------------------------------- Study information:  Study status:  Urgent.  Procedure:  A vascular evaluation was performed with the patient in the supine position. The right common femoral, right femoral, right greater saphenous, right profunda femoral, right popliteal, right peroneal, right posterior tibial, left common femoral, left femoral, left greater saphenous, left profunda femoral, left popliteal, left peroneal, and left posterior tibial veins were studied. Image  quality was good.    Bilateral lower extremity venous duplex evaluation.     Doppler flow study including B-mode compression maneuvers of all visualized segments, color flow Doppler and selected views of pulsed wave Doppler. Birthdate:  Patient birthdate: 06/29/1918.  Age:  Patient is 80 yr old.  Sex:  Gender: female.  Study date:  Study date: 03/02/2014. Study time: 08:09 PM.  Location:  Bedside.  Patient status: Emergency department.  Venous flow:  +--------------------------+-------+------------------------------+ Location                  OverallFlow properties                +--------------------------+-------+------------------------------+ Right common femoral      Patent Phasic; spontaneous;                                            compressible                   +--------------------------+-------+------------------------------+  Right femoral             Patent Compressible                   +--------------------------+-------+------------------------------+ Right profunda femoral    Patent Compressible                   +--------------------------+-------+------------------------------+ Right popliteal           Patent Phasic; spontaneous;                                            compressible                   +--------------------------+-------+------------------------------+ Right posterior tibial    Patent Compressible                   +--------------------------+-------+------------------------------+ Right peroneal            Patent Compressible                   +--------------------------+-------+------------------------------+ Right saphenofemoral      Patent Compressible                   junction                                                        +--------------------------+-------+------------------------------+ Right greater saphenous   Patent Compressible                    +--------------------------+-------+------------------------------+ Left common femoral       Patent Phasic; spontaneous;                                            compressible                   +--------------------------+-------+------------------------------+ Left femoral              Patent Compressible                   +--------------------------+-------+------------------------------+ Left profunda femoral     Patent Compressible                   +--------------------------+-------+------------------------------+ Left popliteal            Patent Phasic; spontaneous;                                            compressible                   +--------------------------+-------+------------------------------+ Left posterior tibial     Patent Compressible                   +--------------------------+-------+------------------------------+ Left peroneal             Patent Compressible                   +--------------------------+-------+------------------------------+ Left saphenofemoral  Patent Compressible                   junction                                                        +--------------------------+-------+------------------------------+ Left greater saphenous    Patent Compressible                   +--------------------------+-------+------------------------------+  ------------------------------------------------------------------- Summary:  - No evidence of deep vein or superficial thrombosis involving the   right lower extremity and left lower extremity. - Incidental findings are consistent with: A ruptured Baker&'s Cyst   on the right fluid coursing 4.18 cm from the popliteal fossa to   the proximal calf. - Mild to moderate interstitial fluid throughout the lower leg   bilaterally. - No evidence of Baker&'s cyst on the left. Transthoracic Echocardiography  Patient:    Tabor, Bartram MR #:        [id number] Study Date: 03/17/2014 Gender:     F Age:        50 Height:     152.4 cm Weight:     50.3 kg BSA:        1.46 m^2 Pt. Status: Room:       [address]    Bonnielee Haff [id number]  FIEPPIRJ     JOACZYSA, [place] [id number]  Kelle Darting [id number]  SONOGRAPHER  Delman Kitten, RCS  ATTENDING    Rai, Ripudeep K  PERFORMING   [facility], Inpatient  cc:  ------------------------------------------------------------------- LV EF: 65%  ------------------------------------------------------------------- Indications:      Syncope 780.2.  ------------------------------------------------------------------- History:   PMH:   Aortic valve disease.  Transient ischemic attack.  Stroke.  Risk factors:  Hypertension.  ------------------------------------------------------------------- Study Conclusions  - Left ventricle: The cavity size was normal. Wall thickness was   increased in a pattern of moderate LVH. The estimated ejection   fraction was 65%. Wall motion was normal; there were no regional   wall motion abnormalities. Findings consistent with left   ventricular diastolic dysfunction. - Aortic valve: Calcified leaflets. Moderate AS. Mean gradient (S):   22 mm Hg. Peak gradient (S): 38 mm Hg. - Mitral valve: Calcified annulus. - Pulmonary arteries: PA peak pressure: 34 mm Hg (S).  Transthoracic echocardiography.  M-mode, complete 2D, spectral Doppler, and color Doppler.  Birthdate:  Patient birthdate: May 17, 1919.  Age:  Patient is 80 yr old.  Sex:  Gender: female. BMI: 21.7 kg/m^2.  Blood pressure:     145/88  Patient status: Inpatient.  Study date:  Study date: 03/17/2014. Study time: 11:06 AM.  Location:  Echo laboratory.  -------------------------------------------------------------------  ------------------------------------------------------------------- Left ventricle:  The cavity size was normal. Wall thickness was increased in a pattern of moderate LVH.  The estimated ejection fraction was 65%. Wall motion was normal; there were no regional wall motion abnormalities. Findings consistent with left ventricular diastolic dysfunction.  ------------------------------------------------------------------- Aortic valve:  Calcified leaflets. Moderate AS.  Doppler:     VTI ratio of LVOT to aortic valve: 0.31. Valve area (VTI): 0.71 cm^2. Indexed valve area (VTI): 0.48 cm^2/m^2. Valve area (Vmax): 0.83 cm^2. Indexed valve area (Vmax): 0.57 cm^2/m^2. Mean velocity ratio of LVOT to aortic valve: 0.31. Valve area (Vmean): 0.71 cm^2. Indexed valve area (Vmean): 0.48 cm^2/m^2.  Mean gradient (S): 22 mm Hg. Peak gradient (S): 38 mm Hg.  ------------------------------------------------------------------- Aorta:  Aortic root: The aortic root was normal in size.  ------------------------------------------------------------------- Mitral valve:   Calcified annulus.  Doppler:  There was trivial regurgitation.    Valve area by continuity equation (using LVOT flow): 0.9 cm^2. Indexed valve area by continuity equation (using LVOT flow): 0.61 cm^2/m^2.    Mean gradient (D): 6 mm Hg. Peak gradient (D): 7 mm Hg.  ------------------------------------------------------------------- Left atrium:  The atrium was at the upper limits of normal in size.   ------------------------------------------------------------------- Right ventricle:  The cavity size was normal. Systolic function was normal.  ------------------------------------------------------------------- Pulmonic valve:    The valve appears to be grossly normal. Doppler:  There was no significant regurgitation.  ------------------------------------------------------------------- Tricuspid valve:   Doppler:  There was mild regurgitation.  ------------------------------------------------------------------- Right atrium:  The atrium was at the upper limits of normal  in size.  ------------------------------------------------------------------- Pericardium:  There was no pericardial effusion.  ------------------------------------------------------------------- Measurements   Left ventricle                            Value          Reference  LV ID, ED, PLAX chordal           (L)     39.1  mm       43 - 52  LV ID, ES, PLAX chordal           (L)     19.6  mm       23 - 38  LV fx shortening, PLAX chordal            50    %        >=29  LV PW thickness, ED                       11.4  mm       ---------  IVS/LV PW ratio, ED                       0.99           <=1.3  Stroke volume, 2D                         51    ml       ---------  Stroke volume/bsa, 2D                     35    ml/m^2   ---------  LV e&', lateral                            7.02  cm/s     ---------  LV E/e&', lateral                          18.52          ---------  LV e&', medial                             5.15  cm/s     ---------  LV E/e&', medial  25.24          ---------  LV e&', average                            6.09  cm/s     ---------  LV E/e&', average                          21.36          ---------    Ventricular septum                        Value          Reference  IVS thickness, ED                         11.3  mm       ---------    LVOT                                      Value          Reference  LVOT ID, S                                17    mm       ---------  LVOT area                                 2.27  cm^2     ---------  LVOT peak velocity, S                     112   cm/s     ---------  LVOT mean velocity, S                     69.9  cm/s     ---------  LVOT VTI, S                               22.6  cm       ---------  LVOT peak gradient, S                     5     mm Hg    ---------    Aortic valve                              Value          Reference  Aortic valve mean velocity, S             224   cm/s     ---------   Aortic valve VTI, S                       72    cm       ---------  Aortic mean gradient, S                   22    mm Hg    ---------  Aortic peak gradient, S                   38    mm Hg    ---------  VTI ratio, LVOT/AV                        0.31           ---------  Aortic valve area, VTI                    0.71  cm^2     ---------  Aortic valve area/bsa, VTI                0.48  cm^2/m^2 ---------  Aortic valve area, peak velocity          0.83  cm^2     ---------  Aortic valve area/bsa, peak               0.57  cm^2/m^2 ---------  velocity  Velocity ratio, mean, LVOT/AV             0.31           ---------  Aortic valve area, mean velocity          0.71  cm^2     ---------  Aortic valve area/bsa, mean               0.48  cm^2/m^2 ---------  velocity    Aorta                                     Value          Reference  Aortic root ID, ED                        29    mm       ---------    Left atrium                               Value          Reference  LA ID, A-P, ES                            27    mm       ---------  LA ID/bsa, A-P                            1.84  cm/m^2   <=2.2  LA volume, S                              48    ml       ---------  LA volume/bsa, S                          32.8  ml/m^2   ---------  LA volume, ES, 1-p A4C                    38    ml       ---------  LA volume/bsa, ES, 1-p A4C  25.9  ml/m^2   ---------  LA volume, ES, 1-p A2C                    53    ml       ---------  LA volume/bsa, ES, 1-p A2C                36.2  ml/m^2   ---------    Mitral valve                              Value          Reference  Mitral E-wave peak velocity               130   cm/s     ---------  Mitral A-wave peak velocity               166   cm/s     ---------  Mitral mean velocity, D                   118   cm/s     ---------  Mitral deceleration time          (H)     345   ms       150 - 230  Mitral mean gradient, D                   6     mm Hg     ---------  Mitral peak gradient, D                   7     mm Hg    ---------  Mitral E/A ratio, peak                    0.8            ---------  Mitral valve area, LVOT                   0.9   cm^2     ---------  continuity  Mitral valve area/bsa, LVOT               0.61  cm^2/m^2 ---------  continuity  Mitral annulus VTI, D                     56.7  cm       ---------    Pulmonary arteries                        Value          Reference  PA pressure, S, DP                (H)     34    mm Hg    <=30    Tricuspid valve                           Value          Reference  Tricuspid regurg peak velocity            278   cm/s     ---------  Tricuspid peak RV-RA gradient             31    mm Hg    ---------  Systemic veins                            Value          Reference  Estimated CVP                             3     mm Hg    ---------    Right ventricle                           Value          Reference  RV pressure, S, DP                (H)     34    mm Hg    <=30  RV s&', lateral, S                         12.8  cm/s     ---------  Legend: CT HEAD WITHOUT CONTRAST   TECHNIQUE: Contiguous axial images were obtained from the base of the skull through the vertex without contrast.   COMPARISON:  CT head 08/26/2013.   FINDINGS: Generalized atrophy with chronic microvascular ischemic change persists. There is no acute stroke or hemorrhage. A 11 mm LEFT anterior falx meningioma is redemonstrated and stable. Calvarium is intact. There is mild chronic sinus disease. No mastoid fluid. Vascular calcification is noted.   IMPRESSION: Stable exam.  No acute intracranial findings.   CHEST  2 VIEW   COMPARISON:  03/02/2014.   FINDINGS: Normal cardiomediastinal silhouette. Clear lung fields. Osteopenia. Glenohumeral degenerative change on the RIGHT. Advanced thoracic spondylosis.   IMPRESSION: No active disease.  Fluid change from priors.   MRI HEAD WITHOUT CONTRAST    TECHNIQUE: Multiplanar, multiecho pulse sequences of the brain and surrounding structures were obtained without intravenous contrast.   COMPARISON:  MRI 06/24/2012.  CT 03/16/2014   FINDINGS: Generalized atrophy, appropriate for age. Negative for hydrocephalus. Pituitary normal in size. Chronic degenerative changes in the cervical spine.   Left frontal parafalcine meningioma measuring 11 mm unchanged from prior studies. No edema in the adjacent brain. No other mass lesion.   Negative for acute infarct.  No significant chronic ischemic change.   Negative for hemorrhage or fluid collection. Paranasal sinuses are clear.   IMPRESSION: No acute infarct   Left frontal parafalcine meningioma is stable from prior studies and is likely asymptomatic.    ASSESSMENT/PLAN:  Lumbar spinal stenosis-pain uncontrolled.  Medications adjusted. HTN- well controlled. Hypokalemia-well repleted. Constipation-well controlled. Urinary incontinence-cont. Toviaz. Check cbc  I have reviewed patient's medical records received at admission/from hospitalization.  CPT CODE: 15400  Sascha Palma Y Zya Finkle, [place] [phone]

## 2014-03-28 ENCOUNTER — Other Ambulatory Visit: Payer: Self-pay | Admitting: *Deleted

## 2014-03-28 MED ORDER — OXYCODONE HCL 5 MG PO TABS: ORAL_TABLET | ORAL | Status: DC

## 2014-03-28 NOTE — Telephone Encounter (Signed)
[facility] 

## 2014-04-03 NOTE — Progress Notes (Signed)
Patient ID: Shelly Martin, female   DOB: [birth year], 80 y.o.   MRN: [id number]   03/22/14  Facility:  Nursing Home Location:  [place] Room Number: 323-5 LEVEL OF CARE:  SNF (31)   Chief Complaint  Patient presents with  . Hospitalization Follow-up    Syncope, Chronic back pain, Hypertension, Urinary incontinence, Restless leg syndrome and Constipation    HISTORY OF PRESENT ILLNESS:  [name] who has been admitted to [facility] on 03/21/14 from [facility] with Syncope which is likely vasovagal, EF 65%. She has been admitted for a short-term rehabilitation.  REASSESSMENT OF ONGOING PROBLEMS:  HTN: Pt 's HTN remains stable.  Denies CP, sob, DOE, pedal edema, headaches, dizziness or visual disturbances.  No complications from the medications currently being used.  Last BP : 128/79  CHRONIC PAIN: The patient's chronic pain is unstable. Patient verbalized that MSIR seems not to work anymore..  CONSTIPATION: The constipation remains stable. No complications from the medications presently being used. Patient denies ongoing constipation, abdominal pain, nausea or vomiting.  PAST MEDICAL HISTORY:  Past Medical History  Diagnosis Date  . Hypertension   . Restless legs syndrome     on Requip  . Ulcer causing bleeding and hole in wall of stomach or small intestine   . Gastric ulcer     years ago  . Arthritis     Back, knees  . Constipation   . Urinary bladder calculus   . Urinary incontinence   . Bowel incontinence   . Aortic stenosis   . Stroke     Mini, no residual    CURRENT MEDICATIONS: Reviewed per MAR/see medication list  Allergies  Allergen Reactions  . Amoxicillin Other (See Comments)    Reaction unknown  . Baclofen Other (See Comments)    "fuzzy in the eyes" and dizzy  . Hctz [Hydrochlorothiazide]     nausea  . Valium [Diazepam] Other (See Comments)    Pt became unresponsive and O2 sats dropped.       REVIEW OF SYSTEMS:  GENERAL: no change in appetite, no fatigue, no weight changes, no fever, chills or weakness RESPIRATORY: no cough, SOB, DOE, wheezing, hemoptysis CARDIAC: no chest pain, edema or palpitations GI: no abdominal pain, diarrhea, constipation, heart burn, nausea or vomiting  PHYSICAL EXAMINATION  GENERAL: no acute distress, normal body habitus EYES: conjunctivae normal, sclerae normal, normal eye lids NECK: supple, trachea midline, no neck masses, no thyroid tenderness, no thyromegaly LYMPHATICS: no LAN in the neck, no supraclavicular LAN RESPIRATORY: breathing is even & unlabored, BS CTAB CARDIAC: RRR, no murmur,no extra heart sounds, no edema GI: abdomen soft, normal BS, no masses, no tenderness, no hepatomegaly, no splenomegaly EXTREMITIES:  PSYCHIATRIC: the patient is alert & oriented to person, affect & behavior appropriate  LABS/RADIOLOGY: Labs reviewed: Basic Metabolic Panel:  Recent Labs  03/16/14 1430 03/17/14 0616 03/19/14 0510  NA 141 140 142  K 4.6 4.3 4.3  CL 101 102 103  CO2 29 30 32  GLUCOSE 125* 90 103*  BUN 43* 33* 23  CREATININE 1.17* 0.94 0.92  CALCIUM 9.1 8.4 8.6   Liver Function Tests:  Recent Labs  03/02/14 1557 03/16/14 1430 03/17/14 0616  AST 19 36 29  ALT 14 18 16   ALKPHOS 78 94 78  BILITOT 0.4 0.4 0.4  PROT 5.8* 6.8 5.4*  ALBUMIN 3.1* 3.6 2.9*   CBC:  Recent Labs  10/15/13 1725 03/02/14 1557 03/16/14  1430 03/17/14 0616 03/19/14 0510  WBC 7.0 6.4 6.1 4.9 6.2  NEUTROABS 5.0 4.6 4.4  --   --   HGB 11.4* 10.4* 10.8* 9.6* 10.4*  HCT 34.6* 32.8* 34.2* 30.1* 32.5*  MCV 89.2 90.9 90.5 90.1 89.5  PLT 211 228 231 209 234   A1C: Invalid input(s): A1C Lipid Panel:  Recent Labs  03/06/14 1329  HDL 61   Cardiac Enzymes:  Recent Labs  03/16/14 1840 03/17/14 0021 03/17/14 0616  TROPONINI <0.30 <0.30 <0.30   CBG:  Recent Labs  03/19/14 0649 03/20/14 0623 03/21/14 0709  GLUCAP 103* 101* 103*      Dg Chest 2 View  03/16/2014   CLINICAL DATA:  Altered mental status.  Weakness.  EXAM: CHEST  2 VIEW  COMPARISON:  03/02/2014.  FINDINGS: Normal cardiomediastinal silhouette. Clear lung fields. Osteopenia. Glenohumeral degenerative change on the RIGHT. Advanced thoracic spondylosis.  IMPRESSION: No active disease.  Fluid change from priors.   Electronically Signed   By: Rolla Flatten M.D.   On: 03/16/2014 14:18   Ct Head Wo Contrast  03/16/2014   CLINICAL DATA:  Altered mental status. Increased confusion. Disorientation.  EXAM: CT HEAD WITHOUT CONTRAST  TECHNIQUE: Contiguous axial images were obtained from the base of the skull through the vertex without contrast.  COMPARISON:  CT head 08/26/2013.  FINDINGS: Generalized atrophy with chronic microvascular ischemic change persists. There is no acute stroke or hemorrhage. A 11 mm LEFT anterior falx meningioma is redemonstrated and stable. Calvarium is intact. There is mild chronic sinus disease. No mastoid fluid. Vascular calcification is noted.  IMPRESSION: Stable exam.  No acute intracranial findings.   Electronically Signed   By: Rolla Flatten M.D.   On: 03/16/2014 14:08   Mr Brain Wo Contrast  03/17/2014   CLINICAL DATA:  Acute encephalopathy.  Confusion  EXAM: MRI HEAD WITHOUT CONTRAST  TECHNIQUE: Multiplanar, multiecho pulse sequences of the brain and surrounding structures were obtained without intravenous contrast.  COMPARISON:  MRI 06/24/2012.  CT 03/16/2014  FINDINGS: Generalized atrophy, appropriate for age. Negative for hydrocephalus. Pituitary normal in size. Chronic degenerative changes in the cervical spine.  Left frontal parafalcine meningioma measuring 11 mm unchanged from prior studies. No edema in the adjacent brain. No other mass lesion.  Negative for acute infarct.  No significant chronic ischemic change.  Negative for hemorrhage or fluid collection. Paranasal sinuses are clear.  IMPRESSION: No acute infarct  Left frontal  parafalcine meningioma is stable from prior studies and is likely asymptomatic.   Electronically Signed   By: Franchot Gallo M.D.   On: 03/17/2014 11:36    ASSESSMENT/PLAN:  Syncope - likely vasovagal, EF of 65%; for rehabilitation Chronic back pain - continue Neurontin and Motrin when necessary; discontinue MSIR and start OxyIR 5 mg 1 tab PO Q 4 hours Essential hypertension - well controlled; continue Norvasc and Cozaar Urinary incontinence - recently started on Toviaz Restless leg syndrome - well controlled; continue Requip Constipation - continue MiraLAX    Spent 50 minutes in patient care.    [facility], NP [employer] [phone]

## 2014-04-05 ENCOUNTER — Telehealth: Payer: Self-pay

## 2014-04-05 ENCOUNTER — Other Ambulatory Visit: Payer: Self-pay | Admitting: *Deleted

## 2014-04-05 NOTE — Telephone Encounter (Signed)
Refill request

## 2014-04-05 NOTE — Telephone Encounter (Signed)
Jan  Daughter [phone]  Jan called and wanted to know why her mother's rOPINIRole (REQUIP) 2 MG tablet had not been sent in, she stated pharmacy had sent two request. Could you check on this please?

## 2014-04-07 ENCOUNTER — Non-Acute Institutional Stay (SKILLED_NURSING_FACILITY): Payer: Medicare Other | Admitting: Adult Health

## 2014-04-07 ENCOUNTER — Encounter: Payer: Self-pay | Admitting: Adult Health

## 2014-04-07 DIAGNOSIS — M4806 Spinal stenosis, lumbar region: Secondary | ICD-10-CM

## 2014-04-07 DIAGNOSIS — M48062 Spinal stenosis, lumbar region with neurogenic claudication: Secondary | ICD-10-CM

## 2014-04-07 DIAGNOSIS — N3941 Urge incontinence: Secondary | ICD-10-CM

## 2014-04-07 DIAGNOSIS — G2581 Restless legs syndrome: Secondary | ICD-10-CM

## 2014-04-07 DIAGNOSIS — E876 Hypokalemia: Secondary | ICD-10-CM

## 2014-04-07 DIAGNOSIS — K5900 Constipation, unspecified: Secondary | ICD-10-CM

## 2014-04-07 DIAGNOSIS — I10 Essential (primary) hypertension: Secondary | ICD-10-CM

## 2014-04-07 MED ORDER — ROPINIROLE HCL 2 MG PO TABS: 4.0000 mg | ORAL_TABLET | Freq: Every day | ORAL | Status: DC

## 2014-04-07 NOTE — Progress Notes (Signed)
Patient ID: Kinlie Janice, female   DOB: [birth year], 80 y.o.   MRN: [id number]   04/07/14  Facility:  Nursing Home Location:  [place] Room Number: 854-6 LEVEL OF CARE:  SNF (31)   Chief Complaint  Patient presents with  . Discharge Note    Chronic back pain, Hypertension, Urinary Incontinence, Restless leg syndrome and Constipation    HISTORY OF PRESENT ILLNESS:  [name] who is for discharge home with home health PT, OT, CNA, social worker and speech therapy. She has been admitted to [facility] on 03/21/14 from [facility] with Syncope which is likely vasovagal, EF 65%. Patient was admitted to this facility for short-term rehabilitation after the patient's recent hospitalization.  Patient has completed SNF rehabilitation and therapy has cleared the patient for discharge.  REASSESSMENT OF ONGOING PROBLEMS:  HTN: Pt 's HTN remains stable.  Denies CP, sob, DOE, pedal edema, headaches, dizziness or visual disturbances.  No complications from the medications currently being used.  Last BP : 134/58  HYPOKALEMIA: The patient's hypokalemia remains stable. Patient denies muscle cramping or palpitations. No complications reported from current potassium supplementation.  URGE INCONTINENCE: The urinary incontinence remains stable.  Patient denies worsening urgency, leakage or nocturia.  No complications reported from the medication currently being used.  PAST MEDICAL HISTORY:  Past Medical History  Diagnosis Date  . Hypertension   . Restless legs syndrome     on Requip  . Ulcer causing bleeding and hole in wall of stomach or small intestine   . Gastric ulcer     years ago  . Arthritis     Back, knees  . Constipation   . Urinary bladder calculus   . Urinary incontinence   . Bowel incontinence   . Aortic stenosis   . Stroke     Mini, no residual    CURRENT MEDICATIONS: Reviewed per MAR/see medication  list  Allergies  Allergen Reactions  . Amoxicillin Other (See Comments)    Reaction unknown  . Baclofen Other (See Comments)    "fuzzy in the eyes" and dizzy  . Hctz [Hydrochlorothiazide]     nausea  . Valium [Diazepam] Other (See Comments)    Pt became unresponsive and O2 sats dropped.     REVIEW OF SYSTEMS:  GENERAL: no change in appetite, no fatigue, no weight changes, no fever, chills or weakness RESPIRATORY: no cough, SOB, DOE, wheezing, hemoptysis CARDIAC: no chest pain, edema or palpitations GI: no abdominal pain, diarrhea, constipation, heart burn, nausea or vomiting  PHYSICAL EXAMINATION  GENERAL: no acute distress, normal body habitus NECK: supple, trachea midline, no neck masses, no thyroid tenderness, no thyromegaly LYMPHATICS: no LAN in the neck, no supraclavicular LAN RESPIRATORY: breathing is even & unlabored, BS CTAB CARDIAC: RRR, no murmur,no extra heart sounds, no edema GI: abdomen soft, normal BS, no masses, no tenderness, no hepatomegaly, no splenomegaly EXTREMITIES:  Able to move all 4 extremities PSYCHIATRIC: the patient is alert & oriented to person, affect & behavior appropriate  LABS/RADIOLOGY: 03/24/14  WBC 7.0 hemoglobin 10.8 hematocrit 34.0 MCV 91.4 Labs reviewed: Basic Metabolic Panel:  Recent Labs  03/16/14 1430 03/17/14 0616 03/19/14 0510  NA 141 140 142  K 4.6 4.3 4.3  CL 101 102 103  CO2 29 30 32  GLUCOSE 125* 90 103*  BUN 43* 33* 23  CREATININE 1.17* 0.94 0.92  CALCIUM 9.1 8.4 8.6   Liver Function Tests:  Recent Labs  03/02/14  1557 03/16/14 1430 03/17/14 0616  AST 19 36 29  ALT 14 18 16   ALKPHOS 78 94 78  BILITOT 0.4 0.4 0.4  PROT 5.8* 6.8 5.4*  ALBUMIN 3.1* 3.6 2.9*   CBC:  Recent Labs  10/15/13 1725 03/02/14 1557 03/16/14 1430 03/17/14 0616 03/19/14 0510  WBC 7.0 6.4 6.1 4.9 6.2  NEUTROABS 5.0 4.6 4.4  --   --   HGB 11.4* 10.4* 10.8* 9.6* 10.4*  HCT 34.6* 32.8* 34.2* 30.1* 32.5*  MCV 89.2 90.9 90.5 90.1  89.5  PLT 211 228 231 209 234   A1C: Invalid input(s): A1C Lipid Panel:  Recent Labs  03/06/14 1329  HDL 61   Cardiac Enzymes:  Recent Labs  03/16/14 1840 03/17/14 0021 03/17/14 0616  TROPONINI <0.30 <0.30 <0.30   CBG:  Recent Labs  03/19/14 0649 03/20/14 0623 03/21/14 0709  GLUCAP 103* 101* 103*     Dg Chest 2 View  03/16/2014   CLINICAL DATA:  Altered mental status.  Weakness.  EXAM: CHEST  2 VIEW  COMPARISON:  03/02/2014.  FINDINGS: Normal cardiomediastinal silhouette. Clear lung fields. Osteopenia. Glenohumeral degenerative change on the RIGHT. Advanced thoracic spondylosis.  IMPRESSION: No active disease.  Fluid change from priors.   Electronically Signed   By: Rolla Flatten M.D.   On: 03/16/2014 14:18   Ct Head Wo Contrast  03/16/2014   CLINICAL DATA:  Altered mental status. Increased confusion. Disorientation.  EXAM: CT HEAD WITHOUT CONTRAST  TECHNIQUE: Contiguous axial images were obtained from the base of the skull through the vertex without contrast.  COMPARISON:  CT head 08/26/2013.  FINDINGS: Generalized atrophy with chronic microvascular ischemic change persists. There is no acute stroke or hemorrhage. A 11 mm LEFT anterior falx meningioma is redemonstrated and stable. Calvarium is intact. There is mild chronic sinus disease. No mastoid fluid. Vascular calcification is noted.  IMPRESSION: Stable exam.  No acute intracranial findings.   Electronically Signed   By: Rolla Flatten M.D.   On: 03/16/2014 14:08   Mr Brain Wo Contrast  03/17/2014   CLINICAL DATA:  Acute encephalopathy.  Confusion  EXAM: MRI HEAD WITHOUT CONTRAST  TECHNIQUE: Multiplanar, multiecho pulse sequences of the brain and surrounding structures were obtained without intravenous contrast.  COMPARISON:  MRI 06/24/2012.  CT 03/16/2014  FINDINGS: Generalized atrophy, appropriate for age. Negative for hydrocephalus. Pituitary normal in size. Chronic degenerative changes in the cervical spine.  Left  frontal parafalcine meningioma measuring 11 mm unchanged from prior studies. No edema in the adjacent brain. No other mass lesion.  Negative for acute infarct.  No significant chronic ischemic change.  Negative for hemorrhage or fluid collection. Paranasal sinuses are clear.  IMPRESSION: No acute infarct  Left frontal parafalcine meningioma is stable from prior studies and is likely asymptomatic.   Electronically Signed   By: Franchot Gallo M.D.   On: 03/17/2014 11:36    ASSESSMENT/PLAN:  Lumbar stenosis with neurogenic claudication - pain is well-controlled;  continue Neurontin 300 mg by mouth every morning and 12 noon and 600 mg by mouth every evening,  Motrin PRN and OxyIR 5 mg 1 tab PO Q 4 hours PRN Essential hypertension - well controlled; continue Norvasc 10 mg daily, Lasix 20 mg daily and Cozaar 15 mg daily Urge incontinence - continue Toviaz 8 mg by mouth daily Restless leg syndrome - well controlled; continue Requip 4 mg 1 tab by mouth daily at bedtime Constipation - continue MiraLAX 17 g +4-8 ounces liquid by mouth daily and  Colace 100 mg by mouth twice a day    I have filled out patient's discharge paperwork and written prescriptions.  Patient will receive home health PT, OT, ST, SW and CNA.  Total discharge time: Less than 30 minutes  Discharge time involved coordination of the discharge process with Education officer, museum, nursing staff and therapy department. Medical justification for home health services verified.    [facility], NP [employer] [phone]

## 2014-04-07 NOTE — Telephone Encounter (Signed)
RX phoned in for Requip 2MG  1-2 tablets QD- eh

## 2014-04-13 ENCOUNTER — Encounter (HOSPITAL_COMMUNITY): Payer: Self-pay | Admitting: Emergency Medicine

## 2014-04-13 ENCOUNTER — Inpatient Hospital Stay (HOSPITAL_COMMUNITY)
Admission: EM | Admit: 2014-04-13 | Discharge: 2014-04-15 | DRG: 917 | Disposition: A | Discharge status: Skilled Nursing Facility | Payer: Medicare Other | Attending: Internal Medicine | Admitting: Internal Medicine

## 2014-04-13 ENCOUNTER — Inpatient Hospital Stay (HOSPITAL_COMMUNITY): Payer: Medicare Other

## 2014-04-13 DIAGNOSIS — Z681 Body mass index (BMI) 19 or less, adult: Secondary | ICD-10-CM | POA: Diagnosis not present

## 2014-04-13 DIAGNOSIS — R4182 Altered mental status, unspecified: Secondary | ICD-10-CM | POA: Diagnosis not present

## 2014-04-13 DIAGNOSIS — G92 Toxic encephalopathy: Secondary | ICD-10-CM | POA: Diagnosis not present

## 2014-04-13 DIAGNOSIS — Z881 Allergy status to other antibiotic agents status: Secondary | ICD-10-CM | POA: Diagnosis not present

## 2014-04-13 DIAGNOSIS — I350 Nonrheumatic aortic (valve) stenosis: Secondary | ICD-10-CM | POA: Diagnosis present

## 2014-04-13 DIAGNOSIS — G2581 Restless legs syndrome: Secondary | ICD-10-CM | POA: Diagnosis present

## 2014-04-13 DIAGNOSIS — N179 Acute kidney failure, unspecified: Secondary | ICD-10-CM | POA: Diagnosis present

## 2014-04-13 DIAGNOSIS — Z8673 Personal history of transient ischemic attack (TIA), and cerebral infarction without residual deficits: Secondary | ICD-10-CM

## 2014-04-13 DIAGNOSIS — Z8711 Personal history of peptic ulcer disease: Secondary | ICD-10-CM

## 2014-04-13 DIAGNOSIS — M1611 Unilateral primary osteoarthritis, right hip: Secondary | ICD-10-CM | POA: Diagnosis present

## 2014-04-13 DIAGNOSIS — T426X5A Adverse effect of other antiepileptic and sedative-hypnotic drugs, initial encounter: Secondary | ICD-10-CM | POA: Diagnosis present

## 2014-04-13 DIAGNOSIS — R55 Syncope and collapse: Secondary | ICD-10-CM | POA: Diagnosis present

## 2014-04-13 DIAGNOSIS — T50905A Adverse effect of unspecified drugs, medicaments and biological substances, initial encounter: Principal | ICD-10-CM | POA: Diagnosis present

## 2014-04-13 DIAGNOSIS — E43 Unspecified severe protein-calorie malnutrition: Secondary | ICD-10-CM | POA: Diagnosis present

## 2014-04-13 DIAGNOSIS — I129 Hypertensive chronic kidney disease with stage 1 through stage 4 chronic kidney disease, or unspecified chronic kidney disease: Secondary | ICD-10-CM | POA: Diagnosis present

## 2014-04-13 DIAGNOSIS — D649 Anemia, unspecified: Secondary | ICD-10-CM | POA: Diagnosis present

## 2014-04-13 DIAGNOSIS — D72829 Elevated white blood cell count, unspecified: Secondary | ICD-10-CM | POA: Diagnosis present

## 2014-04-13 DIAGNOSIS — E875 Hyperkalemia: Secondary | ICD-10-CM | POA: Diagnosis present

## 2014-04-13 DIAGNOSIS — Z888 Allergy status to other drugs, medicaments and biological substances status: Secondary | ICD-10-CM

## 2014-04-13 DIAGNOSIS — E860 Dehydration: Secondary | ICD-10-CM | POA: Diagnosis present

## 2014-04-13 DIAGNOSIS — D509 Iron deficiency anemia, unspecified: Secondary | ICD-10-CM | POA: Diagnosis present

## 2014-04-13 DIAGNOSIS — T40605A Adverse effect of unspecified narcotics, initial encounter: Secondary | ICD-10-CM | POA: Diagnosis present

## 2014-04-13 DIAGNOSIS — I959 Hypotension, unspecified: Secondary | ICD-10-CM | POA: Diagnosis present

## 2014-04-13 DIAGNOSIS — N182 Chronic kidney disease, stage 2 (mild): Secondary | ICD-10-CM | POA: Diagnosis present

## 2014-04-13 DIAGNOSIS — M4800 Spinal stenosis, site unspecified: Secondary | ICD-10-CM | POA: Diagnosis present

## 2014-04-13 DIAGNOSIS — E871 Hypo-osmolality and hyponatremia: Secondary | ICD-10-CM | POA: Diagnosis present

## 2014-04-13 DIAGNOSIS — G9340 Encephalopathy, unspecified: Secondary | ICD-10-CM | POA: Diagnosis present

## 2014-04-13 DIAGNOSIS — T465X5A Adverse effect of other antihypertensive drugs, initial encounter: Secondary | ICD-10-CM | POA: Diagnosis present

## 2014-04-13 DIAGNOSIS — L89150 Pressure ulcer of sacral region, unstageable: Secondary | ICD-10-CM | POA: Diagnosis present

## 2014-04-13 DIAGNOSIS — L98429 Non-pressure chronic ulcer of back with unspecified severity: Secondary | ICD-10-CM | POA: Diagnosis present

## 2014-04-13 LAB — CBC WITH DIFFERENTIAL/PLATELET
BASOS ABS: 0 10*3/uL (ref 0.0–0.1)
BASOS PCT: 0 % (ref 0–1)
EOS ABS: 0 10*3/uL (ref 0.0–0.7)
Eosinophils Relative: 0 % (ref 0–5)
HCT: 31.4 % — ABNORMAL LOW (ref 36.0–46.0)
Hemoglobin: 10.1 g/dL — ABNORMAL LOW (ref 12.0–15.0)
LYMPHS ABS: 0.6 10*3/uL — AB (ref 0.7–4.0)
Lymphocytes Relative: 5 % — ABNORMAL LOW (ref 12–46)
MCH: 29.2 pg (ref 26.0–34.0)
MCHC: 32.2 g/dL (ref 30.0–36.0)
MCV: 90.8 fL (ref 78.0–100.0)
Monocytes Absolute: 0.7 10*3/uL (ref 0.1–1.0)
Monocytes Relative: 6 % (ref 3–12)
NEUTROS PCT: 89 % — AB (ref 43–77)
Neutro Abs: 10.1 10*3/uL — ABNORMAL HIGH (ref 1.7–7.7)
Platelets: 200 10*3/uL (ref 150–400)
RBC: 3.46 MIL/uL — AB (ref 3.87–5.11)
RDW: 14.1 % (ref 11.5–15.5)
WBC: 11.4 10*3/uL — ABNORMAL HIGH (ref 4.0–10.5)

## 2014-04-13 LAB — URINALYSIS, ROUTINE W REFLEX MICROSCOPIC
Bilirubin Urine: NEGATIVE
Glucose, UA: NEGATIVE mg/dL
Hgb urine dipstick: NEGATIVE
KETONES UR: NEGATIVE mg/dL
LEUKOCYTES UA: NEGATIVE
Nitrite: NEGATIVE
PROTEIN: NEGATIVE mg/dL
Specific Gravity, Urine: 1.017 (ref 1.005–1.030)
UROBILINOGEN UA: 0.2 mg/dL (ref 0.0–1.0)
pH: 5 (ref 5.0–8.0)

## 2014-04-13 LAB — BASIC METABOLIC PANEL
ANION GAP: 17 — AB (ref 5–15)
ANION GAP: 17 — AB (ref 5–15)
BUN: 81 mg/dL — ABNORMAL HIGH (ref 6–23)
BUN: 80 mg/dL — ABNORMAL HIGH (ref 6–23)
CALCIUM: 8.5 mg/dL (ref 8.4–10.5)
CHLORIDE: 98 meq/L (ref 96–112)
CO2: 23 mEq/L (ref 19–32)
CO2: 21 mEq/L (ref 19–32)
Calcium: 7.8 mg/dL — ABNORMAL LOW (ref 8.4–10.5)
Chloride: 95 mEq/L — ABNORMAL LOW (ref 96–112)
Creatinine, Ser: 2.28 mg/dL — ABNORMAL HIGH (ref 0.50–1.10)
Creatinine, Ser: 2.22 mg/dL — ABNORMAL HIGH (ref 0.50–1.10)
GFR calc Af Amer: 21 mL/min — ABNORMAL LOW (ref >90)
GFR, EST AFRICAN AMERICAN: 20 mL/min — AB (ref >90)
GFR, EST NON AFRICAN AMERICAN: 17 mL/min — AB (ref >90)
GFR, EST NON AFRICAN AMERICAN: 18 mL/min — AB (ref >90)
Glucose, Bld: 152 mg/dL — ABNORMAL HIGH (ref 70–99)
Glucose, Bld: 188 mg/dL — ABNORMAL HIGH (ref 70–99)
POTASSIUM: 5.8 meq/L — AB (ref 3.7–5.3)
POTASSIUM: 5 meq/L (ref 3.7–5.3)
Sodium: 135 mEq/L — ABNORMAL LOW (ref 137–147)
Sodium: 136 mEq/L — ABNORMAL LOW (ref 137–147)

## 2014-04-13 LAB — RAPID URINE DRUG SCREEN, HOSP PERFORMED
Amphetamines: NOT DETECTED
BARBITURATES: NOT DETECTED
Benzodiazepines: NOT DETECTED
Cocaine: NOT DETECTED
Opiates: POSITIVE — AB
TETRAHYDROCANNABINOL: NOT DETECTED

## 2014-04-13 LAB — I-STAT TROPONIN, ED: TROPONIN I, POC: 0.01 ng/mL (ref 0.00–0.08)

## 2014-04-13 MED ORDER — SODIUM CHLORIDE 0.9 % IV SOLN
INTRAVENOUS | Status: DC
  Administered 2014-04-13 – 2014-04-14 (×2): via INTRAVENOUS

## 2014-04-13 MED ORDER — HYDROCODONE-ACETAMINOPHEN 5-325 MG PO TABS
1.0000 | ORAL_TABLET | ORAL | Status: DC | PRN
  Administered 2014-04-14: 1 via ORAL
  Filled 2014-04-13: qty 2

## 2014-04-13 MED ORDER — SODIUM CHLORIDE 0.9 % IV BOLUS (SEPSIS)
500.0000 mL | Freq: Once | INTRAVENOUS | Status: AC
  Administered 2014-04-13: 500 mL via INTRAVENOUS

## 2014-04-13 MED ORDER — ENOXAPARIN SODIUM 30 MG/0.3ML ~~LOC~~ SOLN
30.0000 mg | SUBCUTANEOUS | Status: DC
  Administered 2014-04-13 – 2014-04-14 (×2): 30 mg via SUBCUTANEOUS
  Filled 2014-04-13 (×3): qty 0.3

## 2014-04-13 MED ORDER — ONDANSETRON HCL 4 MG PO TABS: 4.0000 mg | ORAL_TABLET | Freq: Four times a day (QID) | ORAL | Status: DC | PRN

## 2014-04-13 MED ORDER — BOOST PLUS PO LIQD
237.0000 mL | Freq: Two times a day (BID) | ORAL | Status: DC
  Administered 2014-04-14 – 2014-04-15 (×3): 237 mL via ORAL
  Filled 2014-04-13 (×4): qty 237

## 2014-04-13 MED ORDER — CHLORHEXIDINE GLUCONATE 0.12 % MT SOLN
15.0000 mL | Freq: Two times a day (BID) | OROMUCOSAL | Status: DC
  Administered 2014-04-13 – 2014-04-14 (×2): 15 mL via OROMUCOSAL
  Filled 2014-04-13 (×6): qty 15

## 2014-04-13 MED ORDER — ENOXAPARIN SODIUM 40 MG/0.4ML ~~LOC~~ SOLN: 40.0000 mg | SUBCUTANEOUS | Status: DC

## 2014-04-13 MED ORDER — ACETAMINOPHEN 325 MG PO TABS
650.0000 mg | ORAL_TABLET | Freq: Three times a day (TID) | ORAL | Status: DC
  Administered 2014-04-13 – 2014-04-15 (×4): 650 mg via ORAL
  Filled 2014-04-13 (×11): qty 2

## 2014-04-13 MED ORDER — ONDANSETRON HCL 4 MG/2ML IJ SOLN: 4.0000 mg | Freq: Four times a day (QID) | INTRAMUSCULAR | Status: DC | PRN

## 2014-04-13 MED ORDER — POLYETHYLENE GLYCOL 3350 17 G PO PACK
17.0000 g | PACK | ORAL | Status: DC | PRN
  Filled 2014-04-13: qty 1

## 2014-04-13 MED ORDER — CETYLPYRIDINIUM CHLORIDE 0.05 % MT LIQD
7.0000 mL | Freq: Two times a day (BID) | OROMUCOSAL | Status: DC
  Administered 2014-04-14 – 2014-04-15 (×2): 7 mL via OROMUCOSAL

## 2014-04-13 NOTE — H&P (Addendum)
[facility] History and Physical  Samyukta Cura SPQ:[id number] DOB: 1919-02-20 DOA: 04/13/2014  Referring physician: ED physician PCP: Kelton Pillar, MD   Chief Complaint: altered mental status   HPI:  Pt is 80 yo female who presented to [facility] ED via EMS after noted altered mental status at home. Please note that at the time of the admission pt is very difficult to arouse but she is NAD with VSS. There is no family at bedside, so most of the history is obtained from available records and ED doctor. Pt apparently just came home from rehab this Monday but has remained weak with poor oral intake and minimal ambulation at home. Per report, pt has been taking her medications but some meds have been changed (unclear which ones), Pt at baseline uses walker to get around. There was an episode of diarrhea per EMS report. Please note that pt was recently hospitalized and discharged 03/21/2014 (admitted for encephalopathy and syncope, etiology remained unclear and was thought to be secondary to narcotic use which pt uses for chronic back pain due to spinal stenosis and chronic right hip pain due to severe osteoarthritis).  In ED, pt is sound asleep, opens eyes with sternal rub only, VSS, Blood work notable fro WBC 11.4, Hg 10, K 5.8, Cr 2.28. [facility] asked to admit to medical unit.  Assessment and Plan: Active Problems: Acute encephalopathy - appears to be secondary to sedation effect of MS Contin, Roxicodone, Neurontin, imposed on dehydration from poor oral intake - it is unclear what pt took, per report from EMS, she did takes some narcotics for pain - there is mild leukocytosis but no clear infectious etiology noted, UA clear and CXR pending - admit to medical unit and provide IVF, oxygen as needed HTN - pt hypotensive on admission - will hold Norvasc, Losartan, and Lasix  - resume once BP stabilizes  Acute on chronic renal failure, stage II - recent Cr Oct 2015 was WNL - now up from  dehydration and use of Lasix - will hold Lasix, Losartan, Advil, and will provide IVF, repeat BMP in AM Hyperkalemia - repeat BMP now to ensure no blood work error  - Losartan (ARB) can cause hyperkalemia and this is one of the medications we may need to hold on discharge until renal function stabilizes and K remains stable  Iron deficiency anemia - Hg at baseline ~9 - 10 - no signs of active bleeding - repeat CBC in AM Hyponatremia - likely from poor oral intake and lasix use - IVF as noted above and repeat BMP in AM Severe PCM - nutrition consult once pt more alert   Lovenox SQ for DVT prophylaxis   Radiological Exams on Admission: No results found.   Code Status: Full Family Communication: No family at bedside  Disposition Plan: Admit for further evaluation     Review of Systems:  Unable to obtain due to altered mental status     Past Medical History  Diagnosis Date  . Hypertension   . Restless legs syndrome     on Requip  . Ulcer causing bleeding and hole in wall of stomach or small intestine   . Gastric ulcer     years ago  . Arthritis     Back, knees  . Constipation   . Urinary bladder calculus   . Urinary incontinence   . Bowel incontinence   . Aortic stenosis   . Stroke     Mini, no residual    Past Surgical History  Procedure Laterality Date  . Colon resection  50 years ago  . Tonsillectomy    . Uterine fibroid surgery    . Lumbar laminectomy/decompression microdiscectomy  06/25/2011    Procedure: LUMBAR LAMINECTOMY/DECOMPRESSION MICRODISCECTOMY;  Surgeon: Hosie Spangle, MD;  Location: [place] [facility];  Service: Neurosurgery;  Laterality: Right;  RIGHT Lumbar Two-Three hemilaminectomy and microdiskectomy  . Vaginal hysterectomy      Social History:  reports that she has never smoked. She has never used smokeless tobacco. She reports that she drinks alcohol. She reports that she does not use illicit drugs.  Allergies  Allergen Reactions  .  Amoxicillin Other (See Comments)    Reaction unknown  . Baclofen Other (See Comments)    "fuzzy in the eyes" and dizzy  . Hctz [Hydrochlorothiazide]     nausea  . Valium [Diazepam] Other (See Comments)    Pt became unresponsive and O2 sats dropped.    Family History  Problem Relation Age of Onset  . Anesthesia problems Neg Hx   . Pancreatic cancer Brother   . Dementia Mother   . Stroke Father     Prior to Admission medications   Medication Sig Start Date End Date Taking? Authorizing Provider  acetaminophen (TYLENOL) 325 MG tablet Take 2 tablets (650 mg total) by mouth 3 (three) times daily. 03/21/14   Ripudeep Krystal Eaton, MD  amLODipine (NORVASC) 10 MG tablet Take 1 tablet (10 mg total) by mouth daily. 03/21/14   Ripudeep Krystal Eaton, MD  docusate sodium 100 MG CAPS Take 100 mg by mouth 2 (two) times daily. 03/21/14   Ripudeep Krystal Eaton, MD  fesoterodine (TOVIAZ) 8 MG TB24 tablet Take 1 tablet (8 mg total) by mouth daily. 03/21/14   Ripudeep Krystal Eaton, MD  furosemide (LASIX) 20 MG tablet Take 20 mg by mouth daily. Take with Klor-Con 03/06/14   Lanice Shirts, MD  gabapentin (NEURONTIN) 300 MG capsule Take 300-600 mg by mouth See admin instructions. Take 300mg  in the morning, 300mg  at 12, and 2 tablets (600mg ) in the evening. 06/14/13   Lanice Shirts, MD  ibuprofen (ADVIL,MOTRIN) 200 MG tablet Take 200-400 mg by mouth 3 (three) times daily as needed for moderate pain.     Historical Provider, MD  lactose free nutrition (BOOST PLUS) LIQD Take 237 mLs by mouth 2 (two) times daily between meals. 03/21/14   Ripudeep Krystal Eaton, MD  losartan (COZAAR) 50 MG tablet Take 1 tablet (50 mg total) by mouth daily. 03/21/14   Ripudeep Krystal Eaton, MD  morphine (MSIR) 15 MG tablet Take 1 tablet (15 mg total) by mouth every 8 (eight) hours as needed for severe pain. 03/21/14   Ripudeep Krystal Eaton, MD  oxyCODONE (ROXICODONE) 5 MG immediate release tablet Take one tablet by mouth every 4 hours as needed for pain; Take two  tablets by mouth every 4 hours as needed for pain 03/28/14   Estill Dooms, MD  polyethylene glycol [facility] / Floria Raveling) packet Take 17 g by mouth as needed for mild constipation.  09/24/13   Historical Provider, MD  potassium chloride SA (K-DUR,KLOR-CON) 20 MEQ tablet Take one tablet when you take your diuretic pill 03/06/14   Lanice Shirts, MD  rOPINIRole (REQUIP) 2 MG tablet Take 2 tablets (4 mg total) by mouth at bedtime. 04/07/14   Lanice Shirts, MD    Physical Exam: Filed Vitals:   04/13/14 1500 04/13/14 1518 04/13/14 1530 04/13/14 1600  BP: 110/46 110/46 109/88 98/36  Pulse:  80 80 81  Temp:      TempSrc:      Resp: 13 20 14 12   SpO2:  87% 99% 93%    Physical Exam  Constitutional: Appears frail and cachectic, NAD  HENT: Normocephalic. External right and left ear normal. Dry MM Eyes: Conjunctivae and EOM are normal. PERRLA, no scleral icterus.  Neck: Normal ROM. Neck supple. No JVD. No tracheal deviation. No thyromegaly.  CVS: RRR, SEM 3/6, no gallops, no carotid bruit.  Pulmonary: Effort and breath sounds normal, no stridor, diminished breath sounds at bases Abdominal: Soft. BS +,  no distension, tenderness, rebound or guarding.  Musculoskeletal: Normal range of motion. No edema and no tenderness.  Lymphadenopathy: No lymphadenopathy noted, cervical, inguinal. Neuro: Lethargic, opens eyes with sternal rub, not moving extremities  Skin: Skin is warm and dry. No rash noted. Not diaphoretic. No erythema. No pallor.  Psychiatric: unable to assess due to lethargy   Labs on Admission:  Basic Metabolic Panel:  Recent Labs Lab 04/13/14 1425  NA 135*  K 5.8*  CL 95*  CO2 23  GLUCOSE 152*  BUN 81*  CREATININE 2.28*  CALCIUM 8.5   CBC:  Recent Labs Lab 04/13/14 1425  WBC 11.4*  NEUTROABS 10.1*  HGB 10.1*  HCT 31.4*  MCV 90.8  PLT 200    EKG: pending   Faye Ramsay, MD  [facility] Pager [phone]  If 7PM-7AM, please contact  night-coverage [email or web address] Password [email or web address] 04/13/2014, 5:07 PM

## 2014-04-13 NOTE — ED Notes (Signed)
Bed: WA03 Expected date:  Expected time:  Means of arrival:  Comments: EMS 

## 2014-04-13 NOTE — ED Provider Notes (Signed)
CSN: [id number]     Arrival date & time 04/13/14  1327 History   First MD Initiated Contact with Patient 04/13/14 1505     Chief Complaint  Patient presents with  . Fatigue      HPI  Expand All Collapse All   Per EMS pt comes from home with increased fatigue and weakness. Pt went home on Monday from rehab strengthening and they changed her medications. Pt has been more sleepy, pt took pain meds on empty stomach and then shortly after vomited. Pt was on toilet when EMS arrived to the house and states that pt had diarrhea. Pt uses walker to get around      Past Medical History  Diagnosis Date  . Hypertension   . Restless legs syndrome     on Requip  . Ulcer causing bleeding and hole in wall of stomach or small intestine   . Gastric ulcer     years ago  . Arthritis     Back, knees  . Constipation   . Urinary bladder calculus   . Urinary incontinence   . Bowel incontinence   . Aortic stenosis   . Stroke     Mini, no residual   Past Surgical History  Procedure Laterality Date  . Colon resection  50 years ago  . Tonsillectomy    . Uterine fibroid surgery    . Lumbar laminectomy/decompression microdiscectomy  06/25/2011    Procedure: LUMBAR LAMINECTOMY/DECOMPRESSION MICRODISCECTOMY;  Surgeon: Hosie Spangle, MD;  Location: [place] [facility];  Service: Neurosurgery;  Laterality: Right;  RIGHT Lumbar Two-Three hemilaminectomy and microdiskectomy  . Vaginal hysterectomy     Family History  Problem Relation Age of Onset  . Anesthesia problems Neg Hx   . Pancreatic cancer Brother   . Dementia Mother   . Stroke Father    History  Substance Use Topics  . Smoking status: Never Smoker   . Smokeless tobacco: Never Used  . Alcohol Use: Yes     Comment: rarely   OB History    No data available     Review of Systems  Unable to perform ROS    Allergies  Amoxicillin; Baclofen; Hctz; and Valium  Home Medications   Prior to Admission medications   Medication Sig Start  Date End Date Taking? Authorizing Provider  acetaminophen (TYLENOL) 325 MG tablet Take 2 tablets (650 mg total) by mouth 3 (three) times daily. 03/21/14   Ripudeep Krystal Eaton, MD  amLODipine (NORVASC) 10 MG tablet Take 1 tablet (10 mg total) by mouth daily. 03/21/14   Ripudeep Krystal Eaton, MD  docusate sodium 100 MG CAPS Take 100 mg by mouth 2 (two) times daily. 03/21/14   Ripudeep Krystal Eaton, MD  fesoterodine (TOVIAZ) 8 MG TB24 tablet Take 1 tablet (8 mg total) by mouth daily. 03/21/14   Ripudeep Krystal Eaton, MD  furosemide (LASIX) 20 MG tablet Take 20 mg by mouth daily. Take with Klor-Con 03/06/14   Lanice Shirts, MD  gabapentin (NEURONTIN) 300 MG capsule Take 300-600 mg by mouth See admin instructions. Take 300mg  in the morning, 300mg  at 12, and 2 tablets (600mg ) in the evening. 06/14/13   Lanice Shirts, MD  ibuprofen (ADVIL,MOTRIN) 200 MG tablet Take 200-400 mg by mouth 3 (three) times daily as needed for moderate pain.     Historical Provider, MD  lactose free nutrition (BOOST PLUS) LIQD Take 237 mLs by mouth 2 (two) times daily between meals. 03/21/14   Ripudeep Krystal Eaton, MD  losartan (COZAAR) 50 MG tablet Take 1 tablet (50 mg total) by mouth daily. 03/21/14   Ripudeep Krystal Eaton, MD  morphine (MSIR) 15 MG tablet Take 1 tablet (15 mg total) by mouth every 8 (eight) hours as needed for severe pain. 03/21/14   Ripudeep Krystal Eaton, MD  oxyCODONE (ROXICODONE) 5 MG immediate release tablet Take one tablet by mouth every 4 hours as needed for pain; Take two tablets by mouth every 4 hours as needed for pain 03/28/14   Estill Dooms, MD  polyethylene glycol [facility] / Floria Raveling) packet Take 17 g by mouth as needed for mild constipation.  09/24/13   Historical Provider, MD  potassium chloride SA (K-DUR,KLOR-CON) 20 MEQ tablet Take one tablet when you take your diuretic pill 03/06/14   Lanice Shirts, MD  rOPINIRole (REQUIP) 2 MG tablet Take 2 tablets (4 mg total) by mouth at bedtime. 04/07/14   Lanice Shirts, MD    BP 98/36 mmHg  Pulse 81  Temp(Src) 97.6 F (36.4 C) (Oral)  Resp 12  SpO2 93% Physical Exam  Constitutional: She appears well-developed and well-nourished. She appears lethargic. She is sleeping. No distress.  HENT:  Head: Normocephalic and atraumatic.  Eyes: Pupils are equal, round, and reactive to light.  Neck: Normal range of motion.  Cardiovascular: Normal rate and intact distal pulses.   Pulmonary/Chest: No respiratory distress.  Abdominal: Normal appearance. She exhibits no distension.  Musculoskeletal: Normal range of motion.  Neurological: She appears lethargic. No cranial nerve deficit. She exhibits normal muscle tone. She displays no seizure activity. GCS eye subscore is 4. GCS verbal subscore is 5. GCS motor subscore is 6.  Skin: Skin is warm and dry. No rash noted.  Psychiatric: She has a normal mood and affect. Her behavior is normal.  Nursing note and vitals reviewed.   ED Course  Procedures (including critical care time) Labs Review Labs Reviewed  CBC WITH DIFFERENTIAL - Abnormal; Notable for the following:    WBC 11.4 (*)    RBC 3.46 (*)    Hemoglobin 10.1 (*)    HCT 31.4 (*)    Neutrophils Relative % 89 (*)    Neutro Abs 10.1 (*)    Lymphocytes Relative 5 (*)    Lymphs Abs 0.6 (*)    All other components within normal limits  BASIC METABOLIC PANEL - Abnormal; Notable for the following:    Sodium 135 (*)    Potassium 5.8 (*)    Chloride 95 (*)    Glucose, Bld 152 (*)    BUN 81 (*)    Creatinine, Ser 2.28 (*)    GFR calc non Af Amer 17 (*)    GFR calc Af Amer 20 (*)    Anion gap 17 (*)    All other components within normal limits  URINE RAPID DRUG SCREEN (HOSP PERFORMED) - Abnormal; Notable for the following:    Opiates POSITIVE (*)    All other components within normal limits  URINALYSIS, ROUTINE W REFLEX MICROSCOPIC  I-STAT TROPOININ, ED   Medications  sodium chloride 0.9 % bolus 500 mL (0 mLs Intravenous Stopped 04/13/14 1523)    Imaging  Review No results found.   EKG Interpretation   Date/Time:  Thursday April 13 2014 14:31:26 EST Ventricular Rate:  80 PR Interval:  178 QRS Duration: 90 QT Interval:  372 QTC Calculation: 429 R Axis:   -31 Text Interpretation:  Sinus rhythm Probable left atrial enlargement Left  ventricular hypertrophy Abnormal ekg Confirmed  by Audie Pinto  MD, Samika Vetsch  [id number]) on 04/13/2014 3:07:45 PM      MDM   Final diagnoses:  Dehydration  Acute kidney injury        Dot Lanes, MD 04/13/14 [phone]

## 2014-04-13 NOTE — Progress Notes (Signed)
  CARE MANAGEMENT ED NOTE 04/13/2014  Patient:  Shelly Martin, Shelly Martin   Account Number:  [id number]  Date Initiated:  04/13/2014  Documentation initiated by:  Livia Snellen  Subjective/Objective Assessment:   Patient presents to Ed with increased fatigue and weakness.     Subjective/Objective Assessment Detail:   Patient with pmh of HTN, restless leg syndrome, stroke, aortic stenosis, arthritis.  K 5.8, BUN 81 Creat 2.28, WBC 11.4     Action/Plan:   Action/Plan Detail:   Anticipated DC Date:       Status Recommendation to Physician:   Result of Recommendation:    Other ED Services  Consult Working [place]  CM consult  Other    Choice offered to / List presented to:       [place] arranged  [place]      [place].    Status of service:  Completed, signed off  ED Comments:   ED Comments Detail:  [facility] consulted reageding disosition needs.  EDCM went to speak to patient at bedside, however patient is sound asleep, no family at bedside.  Per chart review, patient is currently living at home.  Patient has a daughter.  Patient was discharged from [facility] to [facility] from 10/27-11/13 for short term rehab.  Patient was discharged from [facility] with home health services:PT, OT, aide, social work and speech therapy.  EDCM called [facility] and spoke to [place] at [place] pm who confirms patient is currently active with [facility] for these services.  EDCM informed [place] that patient is being admitted to [facility].  Per chart review, patient uses a walker to ambulate.  No further EDCM needs at this time.

## 2014-04-13 NOTE — ED Notes (Signed)
Per EMS pt comes from home with increased fatigue and weakness.  Pt went home on Monday from rehab strengthening and they changed her medications.  Pt has been more sleepy, pt took pain meds on empty stomach and then shortly after vomited. Pt was on toilet when EMS arrived to the house and states that pt had diarrhea.  Pt uses walker to get around.  Pt's daughter told EMS that her mother was much better while at the rehab facility. VS 112/50, 88HR, 16R, 98% on RA.

## 2014-04-13 NOTE — Progress Notes (Signed)
CSW discussed Pt. With Nurse Case Manager. The Pt. Was recently admitted at [facility] on March 21, 2014 and was discharged April 07, 2014.   CSW went to visit Pt. In room. However, the Pt was asleep. CSW called the Pt's daughter Shelly Martin [phone] to receive more collateral and also to find out who is the Pt's Home health agency. Daughter did not answer phone, but CSW left a message asking for a call back.   Shelly Martin [phone] ED CSW 04/13/2014 6:22 PM

## 2014-04-14 DIAGNOSIS — N189 Chronic kidney disease, unspecified: Secondary | ICD-10-CM

## 2014-04-14 DIAGNOSIS — R400 Somnolence: Secondary | ICD-10-CM

## 2014-04-14 DIAGNOSIS — R55 Syncope and collapse: Secondary | ICD-10-CM | POA: Diagnosis present

## 2014-04-14 DIAGNOSIS — D509 Iron deficiency anemia, unspecified: Secondary | ICD-10-CM

## 2014-04-14 DIAGNOSIS — E860 Dehydration: Secondary | ICD-10-CM

## 2014-04-14 DIAGNOSIS — N179 Acute kidney failure, unspecified: Secondary | ICD-10-CM | POA: Diagnosis present

## 2014-04-14 DIAGNOSIS — I959 Hypotension, unspecified: Secondary | ICD-10-CM

## 2014-04-14 DIAGNOSIS — G9340 Encephalopathy, unspecified: Secondary | ICD-10-CM

## 2014-04-14 DIAGNOSIS — I350 Nonrheumatic aortic (valve) stenosis: Secondary | ICD-10-CM

## 2014-04-14 DIAGNOSIS — D649 Anemia, unspecified: Secondary | ICD-10-CM | POA: Diagnosis present

## 2014-04-14 DIAGNOSIS — E43 Unspecified severe protein-calorie malnutrition: Secondary | ICD-10-CM | POA: Diagnosis present

## 2014-04-14 DIAGNOSIS — N182 Chronic kidney disease, stage 2 (mild): Secondary | ICD-10-CM

## 2014-04-14 DIAGNOSIS — R4182 Altered mental status, unspecified: Secondary | ICD-10-CM | POA: Diagnosis present

## 2014-04-14 LAB — BASIC METABOLIC PANEL
Anion gap: 16 — ABNORMAL HIGH (ref 5–15)
BUN: 79 mg/dL — ABNORMAL HIGH (ref 6–23)
CALCIUM: 7.7 mg/dL — AB (ref 8.4–10.5)
CO2: 21 mEq/L (ref 19–32)
CREATININE: 2.13 mg/dL — AB (ref 0.50–1.10)
Chloride: 104 mEq/L (ref 96–112)
GFR calc non Af Amer: 19 mL/min — ABNORMAL LOW (ref >90)
GFR, EST AFRICAN AMERICAN: 22 mL/min — AB (ref >90)
Glucose, Bld: 106 mg/dL — ABNORMAL HIGH (ref 70–99)
Potassium: 5.2 mEq/L (ref 3.7–5.3)
SODIUM: 141 meq/L (ref 137–147)

## 2014-04-14 LAB — CBC
HCT: 26.6 % — ABNORMAL LOW (ref 36.0–46.0)
Hemoglobin: 8.6 g/dL — ABNORMAL LOW (ref 12.0–15.0)
MCH: 29.4 pg (ref 26.0–34.0)
MCHC: 32.3 g/dL (ref 30.0–36.0)
MCV: 90.8 fL (ref 78.0–100.0)
PLATELETS: 176 10*3/uL (ref 150–400)
RBC: 2.93 MIL/uL — ABNORMAL LOW (ref 3.87–5.11)
RDW: 14.4 % (ref 11.5–15.5)
WBC: 7.9 10*3/uL (ref 4.0–10.5)

## 2014-04-14 MED ORDER — HYDROCODONE-ACETAMINOPHEN 5-325 MG PO TABS: 1.0000 | ORAL_TABLET | ORAL | Status: DC | PRN

## 2014-04-14 MED ORDER — GABAPENTIN 300 MG PO CAPS: 300.0000 mg | ORAL_CAPSULE | Freq: Three times a day (TID) | ORAL | Status: DC

## 2014-04-14 NOTE — Progress Notes (Signed)
Clinical Social Work Department BRIEF PSYCHOSOCIAL ASSESSMENT 04/14/2014  Patient:  Shelly Martin, Shelly Martin     Account Number:  [id number]     Admit date:  04/13/2014  Clinical Social Worker:  Maryln Manuel  Date/Time:  04/14/2014 11:30 AM  Referred by:  Physician  Date Referred:  04/14/2014 Referred for  SNF Placement   Other Referral:   Interview type:  Patient Other interview type:   and patient daughter via telephone    PSYCHOSOCIAL DATA Living Status:  FAMILY Admitted from facility:   Level of care:   Primary support name:  Jan Wallace/daughter/[phone] Primary support relationship to patient:  CHILD, ADULT Degree of support available:   adequate    CURRENT CONCERNS Current Concerns  Post-Acute Placement   Other Concerns:    SOCIAL WORK ASSESSMENT / PLAN CSW received referral for New SNF.    CSW visited pt at bedside. Pt daughter, Jan called pt room while CSW entered pt room. Pt was agreeable for CSW to speak with pt daughter on phone while speaking with pt in the room. CSW introduced self and explained role. CSW discussed recommendation for rehab at SNF from PT/OT. Pt daughter shared that pt was recently at [facility] and pt daughter would like for pt to return to [facility] as pt is familiar with facility and facility is familiar with pt. Pt expressed that she would be agreeable to [facility] again.    CSW completed FL2 and initiated SNF search to [facility]. CSW contacted [employer] and notified facility of pt interest in returning. [place] to review. CSW to send clinical information to [facility] for insurance authorization.    CSW to continue to follow to assist with pt disposition needs.   Assessment/plan status:  Psychosocial Support/Ongoing Assessment of Needs Other assessment/ plan:   discharge planning   Information/referral to community resources:   Referral to [facility] at pt and pt daughter request    PATIENT'S/FAMILY'S  RESPONSE TO PLAN OF CARE: Pt alert and oriented x 4. Pt pleasant and agreeable to CSW speaking with pt daughter. Pt daughter states that pt was very happy at [facility] and wants pt to return.   Alison Murray, MSW, [place] [facility] [phone]

## 2014-04-14 NOTE — Progress Notes (Addendum)
Progress Note   Shelly Martin WFU:[id number] DOB: 1918/11/29 DOA: 04/13/2014 PCP: Kelton Pillar, MD   Brief Narrative:   Shelly Martin is an 80 y.o. female with a PMH of a history of right pubic ramus fracture and severe advanced right hip OA with chronic pain (managed with Neurontin/opiates), stage II chronic kidney disease, hypertension and CVA with no residual deficits who was admitted 04/13/14 with increased lethargy with mental status changes. Of note, the patient was hospitalized back in October for evaluation of syncope with mental status changes and ultimately discharged to a rehabilitation facility where she had resided up until 04/10/14.  Assessment/Plan:   Principal Problem:   Acute encephalopathy  Likely from polypharmacy in the setting of narcotics, Neurontin, and poor oral intake  No current evidence of acute infectious etiology.  Active Problems:   Moderate aortic stenosis  Loud murmur appreciable.  Moderate with a valve area of 0.71 cm on echo done 03/17/14.     Presyncope  May have been vasovagal or related to polypharmacy.     Hypotension  Antihypertensives held on admission.    Acute on chronic renal failure/Dehydration/hyponatremia  Hold diuretics and gently hydrate.   Iron deficiency anemia  Hemoglobin stable.    Severe protein calorie malnutrition  Dietitian consultation requested.    DVT Prophylaxis  Lovenox ordered.  Code Status: Full. Family Communication: No family at bedside. Disposition Plan: Lives with daughter.  Home when stable.   IV Access:    Peripheral IV   Procedures and diagnostic studies:   Dg Chest Port 1 View 04/13/2014: No active disease.     Medical Consultants:    None.  Anti-Infectives:    None.  Subjective:    Shelly Martin tells me she was brought to the hospital because she passed out.  She complains of severe right hip pain. Denies any recent medication changes. No  dizziness or presyncopal type symptoms.  Objective:    Filed Vitals:   04/13/14 1802 04/13/14 1850 04/13/14 2133 04/14/14 0528  BP: 94/39 104/47 96/50 136/46  Pulse: 72 83 85 81  Temp:  98.9 F (37.2 C) 97.9 F (36.6 C) 100 F (37.8 C)  TempSrc:   Oral Oral  Resp: 14 16 16 18   Height:  5\' 2"  (1.575 m)    Weight:  48.399 kg (106 lb 11.2 oz)    SpO2: 96% 98% 96% 99%    Intake/Output Summary (Last 24 hours) at 04/14/14 2025 Last data filed at 04/14/14 0528  Gross per 24 hour  Intake    120 ml  Output      0 ml  Net    120 ml    Exam: Gen:  NAD Cardiovascular:  RRR, IV/VI murmur Respiratory:  Lungs CTAB Gastrointestinal:  Abdomen soft, NT/ND, + BS Extremities:  2+ edema, venous stasis changes   Data Reviewed:    Labs: Basic Metabolic Panel:  Recent Labs Lab 04/13/14 1425 04/13/14 2100 04/14/14 0446  NA 135* 136* 141  K 5.8* 5.0 5.2  CL 95* 98 104  CO2 23 21 21   GLUCOSE 152* 188* 106*  BUN 81* 80* 79*  CREATININE 2.28* 2.22* 2.13*  CALCIUM 8.5 7.8* 7.7*   GFR Estimated Creatinine Clearance: 12.6 mL/min (by C-G formula based on Cr of 2.13).  CBC:  Recent Labs Lab 04/13/14 1425 04/14/14 0446  WBC 11.4* 7.9  NEUTROABS 10.1*  --   HGB 10.1* 8.6*  HCT 31.4* 26.6*  MCV 90.8 90.8  PLT 200 176  BNP (last 3 results)  Recent Labs  06/29/13 1750 03/02/14 1557  PROBNP 902.2* 528.5*   Sepsis Labs:  Recent Labs Lab 04/13/14 1425 04/14/14 0446  WBC 11.4* 7.9   Microbiology No results found for this or any previous visit (from the past 240 hour(s)).   Medications:   . acetaminophen  650 mg Oral TID  . antiseptic oral rinse  7 mL Mouth Rinse q12n4p  . chlorhexidine  15 mL Mouth Rinse BID  . enoxaparin (LOVENOX) injection  30 mg Subcutaneous Q24H  . lactose free nutrition  237 mL Oral BID BM   Continuous Infusions: . sodium chloride 75 mL/hr at 04/13/14 1923    Time spent: 35 minutes with > 50% of time discussing current diagnostic  test results, clinical impression and plan of care.    LOS: 1 day   RAMA,CHRISTINA  [facility] Pager [phone]. If unable to reach me by pager, please call my cell phone at [phone].  *Please refer to [email or web address], password [email or web address] to get updated schedule on who will round on this patient, as hospitalists switch teams weekly. If 7PM-7AM, please contact night-coverage at [email or web address], password [email or web address] for any overnight needs.  04/14/2014, 8:21 AM

## 2014-04-14 NOTE — Progress Notes (Signed)
INITIAL NUTRITION ASSESSMENT  DOCUMENTATION CODES Per approved criteria  -Not Applicable   INTERVENTION: Regular diet Boost bid Provide preferences and Encouraged po Patient to d/c to [facility] place at discharge.  NUTRITION DIAGNOSIS:  Weight loss related to sub optimal intake as evidenced by 10 lb loss in the past 6 months..   Goal: Intake of meals and supplements to meet >90% estimated needs.  Monitor:  Intake, labs, weight trend  Reason for Assessment: Consult/MST  80 y.o. female  Admitting Dx: Acute encephalopathy  ASSESSMENT: Patient admitted with acute encephalopathy due to polypharmacy.  Pat with PMH of right  Pubic ramus fx and severe advanced right hip OA, stage II chronic kidney disease, HTN, CVA admitted from [facility] with increased lethargy and mental status changes.    Patient states that her appetite is usually good "I eat well when I like it." [employer] losing 30 lbs recently and does not know why although stated that she did not like the food at [place] place very well. Tolerating meals well here with good intake. UBW 140 lbs but unable to tell me how long ago this was.   Weight appears stable for the last month and loss of about 10 lbs since May prior to that.  Patient with increased muscle wasting since April. Intake of lunch 90%  Nutrition Focused Physical Exam:  Subcutaneous Fat:  Orbital Region: wnl Upper Arm Region: wnl Thoracic and Lumbar Region: n/a  Muscle:  Temple Region: moderate Clavicle Bone Region: mild wasting Clavicle and Acromion Bone Region: moderate Scapular Bone Region: mild Dorsal Hand: moderate Patellar Region: mild Anterior Thigh Region: n/a Posterior Calf Region: n/a  Edema: not noted   Height: Ht Readings from Last 1 Encounters:  04/13/14 5\' 2"  (1.575 m)    Weight: Wt Readings from Last 1 Encounters:  04/13/14 106 lb 11.2 oz (48.399 kg)    Ideal Body Weight: 110 lbs  % Ideal Body Weight:  96  Wt Readings from Last 10 Encounters:  04/13/14 106 lb 11.2 oz (48.399 kg)  04/07/14 107 lb (48.535 kg)  03/25/14 105 lb 9.6 oz (47.9 kg)  03/22/14 105 lb 9.6 oz (47.9 kg)  03/20/14 106 lb 14.8 oz (48.5 kg)  03/06/14 106 lb (48.081 kg)  09/26/13 116 lb (52.617 kg)  09/07/13 111 lb (50.349 kg)  08/29/13 112 lb 8 oz (51.03 kg)  08/22/13 110 lb (49.896 kg)    Usual Body Weight: 140 lbs per patient but can not tell me how long ago.  % Usual Body Weight: 76  BMI:  Body mass index is 19.51 kg/(m^2).  Estimated Nutritional Needs: Kcal: 1300-1500 Protein: 50-60 gm Fluid: >1.3L daily  Skin: intact  Diet Order: Diet regular  EDUCATION NEEDS: -Education needs addressed   Intake/Output Summary (Last 24 hours) at 04/14/14 1732 Last data filed at 04/14/14 1408  Gross per 24 hour  Intake    480 ml  Output      0 ml  Net    480 ml    Labs:   Recent Labs Lab 04/13/14 1425 04/13/14 2100 04/14/14 0446  NA 135* 136* 141  K 5.8* 5.0 5.2  CL 95* 98 104  CO2 23 21 21   BUN 81* 80* 79*  CREATININE 2.28* 2.22* 2.13*  CALCIUM 8.5 7.8* 7.7*  GLUCOSE 152* 188* 106*    CBG (last 3)  No results for input(s): GLUCAP in the last 72 hours.  Scheduled Meds: . acetaminophen  650 mg Oral TID  .  antiseptic oral rinse  7 mL Mouth Rinse q12n4p  . chlorhexidine  15 mL Mouth Rinse BID  . enoxaparin (LOVENOX) injection  30 mg Subcutaneous Q24H  . lactose free nutrition  237 mL Oral BID BM    Continuous Infusions: . sodium chloride 75 mL/hr at 04/13/14 2563    Past Medical History  Diagnosis Date  . Hypertension   . Restless legs syndrome     on Requip  . Ulcer causing bleeding and hole in wall of stomach or small intestine   . Gastric ulcer     years ago  . Arthritis     Back, knees  . Constipation   . Urinary bladder calculus   . Urinary incontinence   . Bowel incontinence   . Aortic stenosis   . Stroke     Mini, no residual    Past Surgical History  Procedure  Laterality Date  . Colon resection  50 years ago  . Tonsillectomy    . Uterine fibroid surgery    . Lumbar laminectomy/decompression microdiscectomy  06/25/2011    Procedure: LUMBAR LAMINECTOMY/DECOMPRESSION MICRODISCECTOMY;  Surgeon: Hosie Spangle, MD;  Location: [place] [facility];  Service: Neurosurgery;  Laterality: Right;  RIGHT Lumbar Two-Three hemilaminectomy and microdiskectomy  . Vaginal hysterectomy      Antonieta Iba, RD, LDN Clinical Inpatient Dietitian Pager:  [phone] Weekend and after hours pager:  [phone]

## 2014-04-14 NOTE — Evaluation (Signed)
Physical Therapy Evaluation Patient Details Name: Shelly Martin MRN: [id number] DOB: Oct 13, 1918 Today's Date: 04/14/2014   History of Present Illness  Shelly Martin is a 80 y.o. female admitted 04/13/14 with acute encephalopathy and dehydration. Pt was at [facility] 10/27-11/13/15. PMH of HTN, arthritis, urinary incontinence, bowel incontinence, aortic stenosis, and CVA.   Clinical Impression  *Pt admitted with acute encephalopathy and dehydration**. Pt currently with functional limitations due to the deficits listed below (see PT Problem List).  Pt will benefit from skilled PT to increase their independence and safety with mobility to allow discharge to the venue listed below.   Pt ambulated 210' with RW and supervision, no LOB. Confusion is her primary limiting factor. She is able to follow commands with increased time and extra cuing, but is not oriented to her situation. She at times makes non-sensical statements but is aware of this (she stated, "I'm a bit bewildered right now."). 24* supervision recommended due to confusion. SNF vs home depending on pt's cognitive status at time of DC and family ability to provided needed care. Pt stated she lives with her daughter who works during the day.  **    Follow Up Recommendations SNF;Supervision/Assistance - 24 hour    Equipment Recommendations  None recommended by PT    Recommendations for Other Services       Precautions / Restrictions Precautions Precautions: Fall Restrictions Weight Bearing Restrictions: No      Mobility  Bed Mobility Overal bed mobility: Modified Independent             General bed mobility comments: HOB up 30*  Transfers Overall transfer level: Needs assistance Equipment used: Rolling walker (2 wheeled) Transfers: Sit to/from Stand Sit to Stand: Min assist         General transfer comment: min A to rise/steady  Ambulation/Gait Ambulation/Gait assistance: Supervision Ambulation  Distance (Feet): 210 Feet Assistive device: Rolling walker (2 wheeled) Gait Pattern/deviations: Decreased stride length   Gait velocity interpretation: at or above normal speed for age/gender General Gait Details: steady with RW, supervision due to confusion  Stairs            Wheelchair Mobility    Modified Rankin (Stroke Patients Only)       Balance Overall balance assessment: Needs assistance   Sitting balance-Leahy Scale: Good       Standing balance-Leahy Scale: Fair                               Pertinent Vitals/Pain Pain Assessment: Faces Faces Pain Scale: Hurts little more Pain Location: R hip with walking, R shoulder with attempted elevation Pain Intervention(s): Limited activity within patient's tolerance;Monitored during session (notified RN)    Home Living Family/patient expects to be discharged to:: Unsure Living Arrangements: Children               Additional Comments: pt stated she was living with her daughter PTA and that her daughter works, at present pt needs 68* supervision due to confusion    Prior Function           Comments: Pt reports that she was independent with use of RW or cane. However, unsure of pt's accuracy and no family present to confirm.      Hand Dominance   Dominant Hand: Right    Extremity/Trunk Assessment   Upper Extremity Assessment: RUE deficits/detail RUE Deficits / Details: R shoulder elevation AAROM 30* limited by pain, pt  unable to give h/o this issue         Lower Extremity Assessment: Overall WFL for tasks assessed      Cervical / Trunk Assessment: Kyphotic  Communication   Communication: HOH  Cognition Arousal/Alertness: Awake/alert Behavior During Therapy: WFL for tasks assessed/performed Overall Cognitive Status: Impaired/Different from baseline Area of Impairment: Memory;Problem solving;Orientation     Memory: Decreased short-term memory         General Comments:  oriented to location and self, not to situation, pt makes some non-sensical statements but seems aware of her deficits, "I'm a bit bewildered right now".     General Comments      Exercises        Assessment/Plan    PT Assessment Patient needs continued PT services  PT Diagnosis Generalized weakness;Altered mental status   PT Problem List Decreased cognition;Decreased balance;Decreased mobility  PT Treatment Interventions Gait training;Therapeutic activities;Functional mobility training;Cognitive remediation;Patient/family education;Balance training   PT Goals (Current goals can be found in the Care Plan section) Acute Rehab PT Goals PT Goal Formulation: Patient unable to participate in goal setting Time For Goal Achievement: 04/28/14 Potential to Achieve Goals: Fair    Frequency Min 3X/week   Barriers to discharge        Co-evaluation               End of Session Equipment Utilized During Treatment: Gait belt Activity Tolerance: Patient tolerated treatment well Patient left: in chair;with call bell/phone within reach;with chair alarm set Nurse Communication: Mobility status         Time: 0940-1004 PT Time Calculation (min) (ACUTE ONLY): 24 min   Charges:   PT Evaluation $Initial PT Evaluation Tier I: 1 Procedure PT Treatments $Gait Training: 8-22 mins   PT G Codes:          Philomena Doheny 04/14/2014, 10:17 AM [phone]

## 2014-04-14 NOTE — Progress Notes (Signed)
CSW continuing to follow.  Pt received bed offer from [facility] and agreeable to [facility] upon discharge.  CSW faxed pt clinicals to [facility] and received notification from [place] that facility received insurance authorization from [facility].   Per MD, pt potential discharge for Saturday 04/15/14. CSW faxed preliminary discharge summary to [facility] and [place] agreeable to admission on Saturday 04/15/14 if pt medically stable.  CSW met with pt and pt daughter, Jan at bedside to update. Pt and pt daughter agreeable to transition to [facility] on Saturday if medically stable. Pt daughter, Mary Sella stated that she has to work from 64 am-10 pm tomorrow (Saturday), but would like to be contacted at her work to be notified if pt will be transferring to [facility] on Saturday or not. Pt daughter work Dentist listed in Research officer, trade union Note.   Weekend CSW to contact [place] at [phone] to facilitate pt discharge.   CSW to continue to follow and facilitate pt discharge needs when pt medically ready.   Alison Murray, MSW, [place] Work [phone]

## 2014-04-14 NOTE — Evaluation (Signed)
Occupational Therapy Evaluation Patient Details Name: Shelly Martin MRN: [id number] DOB: 10-13-1918 Today's Date: 04/14/2014    History of Present Illness Shelly Martin is a 80 y.o. female admitted 04/13/14 with acute encephalopathy and dehydration. Pt was at [facility] 10/27-11/13/15. PMH of HTN, arthritis, urinary incontinence, bowel incontinence, aortic stenosis, and CVA.    Clinical Impression   Pt admitted with above. She demonstrates the below listed deficits and will benefit from continued OT to maximize safety and independence with BADLs.  Pt presents to OT with confusion, impaired balance, and generalized weakness.  Currently, she requires min A for BADLs.  She will need 24 hour assist at discharge - likely will need SNF.      Follow Up Recommendations  SNF;Supervision/Assistance - 24 hour    Equipment Recommendations  Other (comment) (TBD)    Recommendations for Other Services       Precautions / Restrictions Precautions Precautions: Fall Restrictions Weight Bearing Restrictions: No      Mobility Bed Mobility Overal bed mobility: Modified Independent             General bed mobility comments: HOB up 30*  Transfers Overall transfer level: Needs assistance Equipment used: Rolling walker (2 wheeled) Transfers: Stand Pivot Transfers;Sit to/from Stand Sit to Stand: Min assist Stand pivot transfers: Min guard       General transfer comment: min A to rise/steady    Balance Overall balance assessment: Needs assistance Sitting-balance support: Feet supported Sitting balance-Leahy Scale: Good     Standing balance support: During functional activity Standing balance-Leahy Scale: Fair                              ADL Overall ADL's : Needs assistance/impaired Eating/Feeding: Independent;Sitting   Grooming: Wash/dry hands;Wash/dry face;Oral care;Min guard;Cueing for sequencing   Upper Body Bathing: Minimal  assitance;Sitting Upper Body Bathing Details (indicate cue type and reason): min A for thoroughness Lower Body Bathing: Minimal assistance;Sit to/from stand Lower Body Bathing Details (indicate cue type and reason): difficulty accessing feet Upper Body Dressing : Minimal assistance;Sitting   Lower Body Dressing: Moderate assistance;Sit to/from stand Lower Body Dressing Details (indicate cue type and reason): mod A to don socks  Toilet Transfer: Minimal assistance;Ambulation;RW Toilet Transfer Details (indicate cue type and reason): assist to stand from low commode without arm rests Toileting- Clothing Manipulation and Hygiene: Moderate assistance;Sit to/from stand Toileting - Clothing Manipulation Details (indicate cue type and reason): Pt with urinary urgency.  She demonstrated difficulty sequencing and problem solving how to wipe herself - required mod verbal cues as well as assist     Functional mobility during ADLs: Min guard;Rolling walker       Vision                     Perception     Praxis      Pertinent Vitals/Pain Pain Assessment: Faces Faces Pain Scale: Hurts little more Pain Location: Rt hip and back  Pain Descriptors / Indicators: Grimacing Pain Intervention(s): Monitored during session;Repositioned     Hand Dominance Right   Extremity/Trunk Assessment Upper Extremity Assessment Upper Extremity Assessment: RUE deficits/detail RUE Deficits / Details: Pt with very limited shoulder elevation ~30*.  Pt reports this is long standing due to arthritis RUE Coordination: decreased gross motor   Lower Extremity Assessment Lower Extremity Assessment: Defer to PT evaluation   Cervical / Trunk Assessment Cervical / Trunk Assessment: Kyphotic   Communication Communication  Communication: HOH   Cognition Arousal/Alertness: Awake/alert Behavior During Therapy: WFL for tasks assessed/performed Overall Cognitive Status: Impaired/Different from baseline Area of  Impairment: Orientation;Attention;Memory;Following commands;Safety/judgement;Awareness;Problem solving Orientation Level: Disoriented to;Situation;Time Current Attention Level: Selective Memory: Decreased short-term memory Following Commands: Follows one step commands consistently Safety/Judgement: Decreased awareness of deficits   Problem Solving: Slow processing;Difficulty sequencing;Requires verbal cues;Requires tactile cues General Comments: Pt confused   General Comments       Exercises       Shoulder Instructions      Home Living Family/patient expects to be discharged to:: Unsure Living Arrangements: Children                               Additional Comments: pt stated she was living with her daughter PTA and that her daughter works, at present pt needs 56* supervision due to confusion      Prior Functioning/Environment Level of Independence: Independent with assistive device(s)        Comments: Pt reports that she was independent with use of RW or cane. However, unsure of pt's accuracy and no family present to confirm.     OT Diagnosis: Generalized weakness;Cognitive deficits   OT Problem List: Decreased strength;Decreased activity tolerance;Impaired balance (sitting and/or standing);Decreased safety awareness;Decreased knowledge of use of DME or AE;Pain   OT Treatment/Interventions: Self-care/ADL training;DME and/or AE instruction;Therapeutic activities;Patient/family education;Balance training;Cognitive remediation/compensation    OT Goals(Current goals can be found in the care plan section) Acute Rehab OT Goals OT Goal Formulation: Patient unable to participate in goal setting Time For Goal Achievement: 04/28/14 Potential to Achieve Goals: Good ADL Goals Pt Will Perform Grooming: with supervision;standing Pt Will Perform Upper Body Bathing: with supervision;sitting Pt Will Perform Lower Body Bathing: with supervision;sit to/from stand Pt Will  Perform Upper Body Dressing: with supervision;sitting Pt Will Perform Lower Body Dressing: with supervision;sit to/from stand Pt Will Transfer to Toilet: with supervision;ambulating;regular height toilet;grab bars Pt Will Perform Toileting - Clothing Manipulation and hygiene: with supervision;sit to/from stand  OT Frequency: Min 2X/week   Barriers to D/C: Decreased caregiver support  uncertain if daughter able to provide 24 hour assist       Co-evaluation              End of Session Equipment Utilized During Treatment: Rolling walker Nurse Communication: Mobility status  Activity Tolerance: Patient tolerated treatment well Patient left: in chair;with nursing/sitter in room   Time: 8466-5993 OT Time Calculation (min): 33 min Charges:  OT General Charges $OT Visit: 1 Procedure OT Evaluation $Initial OT Evaluation Tier I: 1 Procedure OT Treatments $Self Care/Home Management : 23-37 mins G-Codes:    Flor Whitacre M 2014-05-04, 11:33 AM

## 2014-04-14 NOTE — Discharge Summary (Addendum)
Physician Discharge Summary  Seretha Estabrooks DUK:[id number] DOB: 1919/01/17 DOA: 04/13/2014  PCP: Kelton Pillar, MD  Admit date: 04/13/2014 Discharge date: 04/15/2014   Recommendations for Outpatient Follow-Up:   1. Pain medications adjusted secondary to polypharmacy. 2. Will need ongoing assessment and aggressive treatment of sacral wound with overlying eschar to ensure wound does not progress further. 3. Note: Antihypertensives discontinued secondary to low blood pressure. Please monitor blood pressure closely post discharge and consider resumption of antihypertensives if needed. Start with Norvasc first given its relatively low incidence of adverse side effects.   Discharge Diagnosis:   Principal Problem:    Acute encephalopathy secondary to polypharmacy Active Problems:    Dehydration    Altered mental state    Moderate aortic stenosis    Pre-syncope    Hypotension    Acute on chronic renal failure    Anemia, iron deficiency    Protein-calorie malnutrition, severe    Discharge Condition: Improved.  Diet recommendation: Low sodium, heart healthy.    History of Present Illness:   Shelly Martin is an 80 y.o. female with a PMH of a history of right pubic ramus fracture and severe advanced right hip OA with chronic pain (managed with Neurontin/opiates), stage II chronic kidney disease, hypertension and CVA with no residual deficits who was admitted 04/13/14 with increased lethargy with mental status changes. Of note, the patient was hospitalized back in October for evaluation of syncope with mental status changes and ultimately discharged to a rehabilitation facility where she had resided up until 04/10/14.   Hospital Course by Problem:   Principal Problem:  Acute encephalopathy  Likely from polypharmacy in the setting of narcotics, Neurontin, and poor oral intake  No current evidence of acute infectious etiology.  Active Problems:   Sacral  decubitus ulcer  Recommend aggressive wound care, reduction of pressure to sacrum with air mattress overlay.   Moderate aortic stenosis  Loud murmur appreciable.  Moderate with a valve area of 0.71 cm on echo done 03/17/14.   Presyncope  May have been vasovagal or related to polypharmacy.   Hypotension  D/C anti-hypertensives for now.    Acute on chronic renal failure/Dehydration/hyponatremia  Held diuretics and gently hydrated.  Iron deficiency anemia  Hemoglobin stable.   Severe protein calorie malnutrition  Dietitian consultation requested.    Medical Consultants:    None.   Discharge Exam:   Filed Vitals:   04/15/14 0549  BP: 137/67  Pulse: 88  Temp: 97.9 F (36.6 C)  Resp: 18   Filed Vitals:   04/14/14 0528 04/14/14 1410 04/14/14 2116 04/15/14 0549  BP: 136/46 110/54 100/40 137/67  Pulse: 81 84 68 88  Temp: 100 F (37.8 C) 98.9 F (37.2 C) 98 F (36.7 C) 97.9 F (36.6 C)  TempSrc: Oral Oral Oral Oral  Resp: 18 17 16 18   Height:      Weight:      SpO2: 99% 99% 90% 94%    Gen:  NAD Cardiovascular:  RRR, No M/R/G Respiratory: Lungs CTAB Gastrointestinal: Abdomen soft, NT/ND with normal active bowel sounds. Extremities: No C/E/C Sacrum: 2 cm wound at top of intergluteal fold with overlying eschar.    The results of significant diagnostics from this hospitalization (including imaging, microbiology, ancillary and laboratory) are listed below for reference.     Procedures and Diagnostic Studies:   Dg Chest Port 1 View 04/13/2014: No active disease.  Labs:   Basic Metabolic Panel:  Recent Labs Lab 04/13/14 1425 04/13/14 2100 04/14/14 0446  04/15/14 0518  NA 135* 136* 141 135*  K 5.8* 5.0 5.2 5.1  CL 95* 98 104 100  CO2 23 21 21 22   GLUCOSE 152* 188* 106* 115*  BUN 81* 80* 79* 78*  CREATININE 2.28* 2.22* 2.13* 2.48*  CALCIUM 8.5 7.8* 7.7* 8.0*   GFR Estimated Creatinine Clearance: 10.8 mL/min (by C-G formula based  on Cr of 2.48).  CBC:  Recent Labs Lab 04/13/14 1425 04/14/14 0446  WBC 11.4* 7.9  NEUTROABS 10.1*  --   HGB 10.1* 8.6*  HCT 31.4* 26.6*  MCV 90.8 90.8  PLT 200 176     Discharge Instructions:       Discharge Instructions    Call MD for:    Complete by:  As directed   Excessive sleepiness.     Diet - low sodium heart healthy    Complete by:  As directed      Increase activity slowly    Complete by:  As directed      Walk with assistance    Complete by:  As directed             Medication List    STOP taking these medications        amLODipine 10 MG tablet  Commonly known as:  NORVASC     furosemide 20 MG tablet  Commonly known as:  LASIX     HYDROmorphone 4 MG tablet  Commonly known as:  DILAUDID     losartan 50 MG tablet  Commonly known as:  COZAAR     morphine 15 MG tablet  Commonly known as:  MSIR     oxyCODONE 5 MG immediate release tablet  Commonly known as:  ROXICODONE      TAKE these medications        acetaminophen 325 MG tablet  Commonly known as:  TYLENOL  Take 2 tablets (650 mg total) by mouth 3 (three) times daily.     collagenase ointment  Commonly known as:  SANTYL  Apply topically daily.     DSS 100 MG Caps  Take 100 mg by mouth 2 (two) times daily.     fesoterodine 8 MG Tb24 tablet  Commonly known as:  TOVIAZ  Take 1 tablet (8 mg total) by mouth daily.     gabapentin 300 MG capsule  Commonly known as:  NEURONTIN  Take 1 capsule (300 mg total) by mouth 3 (three) times daily. Reduce your dose to 300 mg 3 x a day.     HYDROcodone-acetaminophen 5-325 MG per tablet  Commonly known as:  NORCO/VICODIN  Take 1-2 tablets by mouth every 4 (four) hours as needed for moderate pain.     ibuprofen 200 MG tablet  Commonly known as:  ADVIL,MOTRIN  Take 200-400 mg by mouth 3 (three) times daily as needed for moderate pain.     lactose free nutrition Liqd  Take 237 mLs by mouth 2 (two) times daily between meals.     polyethylene  glycol packet  Commonly known as:  MIRALAX / GLYCOLAX  Take 17 g by mouth as needed for mild constipation.     potassium chloride SA 20 MEQ tablet  Commonly known as:  K-DUR,KLOR-CON  Take one tablet when you take your diuretic pill     rOPINIRole 2 MG tablet  Commonly known as:  REQUIP  Take 2 tablets (4 mg total) by mouth at bedtime.       Follow-up Information    Follow up with  SCHOENHOFF,DEBBIE, MD. Schedule an appointment as soon as possible for a visit in 1 week.   Specialty:  Internal Medicine   Why:  Hospital follow up.   Contact information:   [place] [address] [phone]       Please follow up.   Why:  Hospital follow up       Time coordinating discharge: 35  minutes.  Signed:  Moselle Rister  Pager [phone] [facility] 04/15/2014, 2:11 PM

## 2014-04-14 NOTE — Progress Notes (Signed)
Clinical Social Work Department CLINICAL SOCIAL WORK PLACEMENT NOTE 04/14/2014  Patient:  Shelly Martin, Shelly Martin  Account Number:  [id number] Admit date:  04/13/2014  Clinical Social Worker:  Maryln Manuel  Date/time:  04/14/2014 04:53 PM  Clinical Social Work is seeking post-discharge placement for this patient at the following level of care:   SKILLED NURSING   (*CSW will update this form in Epic as items are completed)   04/14/2014  Patient/family provided with [place] Department of Clinical Social Work's list of facilities offering this level of care within the geographic area requested by the patient (or if unable, by the patient's family).  04/14/2014  Patient/family informed of their freedom to choose among providers that offer the needed level of care, that participate in Medicare, Medicaid or managed care program needed by the patient, have an available bed and are willing to accept the patient.  04/14/2014  Patient/family informed of [facility]' ownership interest in [facility], as well as of the fact that they are under no obligation to receive care at this facility.  PASARR submitted to EDS on 04/14/2014 PASARR number received on 04/14/2014  FL2 transmitted to all facilities in geographic area requested by pt/family on  04/14/2014 FL2 transmitted to all facilities within larger geographic area on   Patient informed that his/her managed care company has contracts with or will negotiate with  certain facilities, including the following:     Patient/family informed of bed offers received:  04/14/2014 Patient chooses bed at [place] Physician recommends and patient chooses bed at    Patient to be transferred to  on   Patient to be transferred to facility by  Patient and family notified of transfer on  Name of family member notified:    The following physician request were entered in Epic:   Additional Comments:   Alison Murray, MSW,  [place] Work [phone]

## 2014-04-15 DIAGNOSIS — L89150 Pressure ulcer of sacral region, unstageable: Secondary | ICD-10-CM

## 2014-04-15 DIAGNOSIS — L98429 Non-pressure chronic ulcer of back with unspecified severity: Secondary | ICD-10-CM | POA: Diagnosis present

## 2014-04-15 LAB — BASIC METABOLIC PANEL
ANION GAP: 13 (ref 5–15)
BUN: 78 mg/dL — AB (ref 6–23)
CHLORIDE: 100 meq/L (ref 96–112)
CO2: 22 mEq/L (ref 19–32)
CREATININE: 2.48 mg/dL — AB (ref 0.50–1.10)
Calcium: 8 mg/dL — ABNORMAL LOW (ref 8.4–10.5)
GFR calc Af Amer: 18 mL/min — ABNORMAL LOW (ref >90)
GFR calc non Af Amer: 16 mL/min — ABNORMAL LOW (ref >90)
Glucose, Bld: 115 mg/dL — ABNORMAL HIGH (ref 70–99)
POTASSIUM: 5.1 meq/L (ref 3.7–5.3)
Sodium: 135 mEq/L — ABNORMAL LOW (ref 137–147)

## 2014-04-15 MED ORDER — COLLAGENASE 250 UNIT/GM EX OINT
TOPICAL_OINTMENT | Freq: Every day | CUTANEOUS | Status: DC
  Administered 2014-04-15: 14:00:00 via TOPICAL
  Filled 2014-04-15: qty 30

## 2014-04-15 MED ORDER — COLLAGENASE 250 UNIT/GM EX OINT: TOPICAL_OINTMENT | Freq: Every day | CUTANEOUS | Status: DC

## 2014-04-15 NOTE — Progress Notes (Signed)
CSW prepared discharge packet for pt to return to [facility] gave RN phone number to call for report  CSW called [facility] and faxed discharge summary  CSW called pt's daughter Jan to advise of discharge back to [place] called transport for pt to be picked up and taken to [facility]  No further social work needs  CSW signing off  Dede Query, [place] Social Worker - Weekend Coverage cell #: [phone]

## 2014-04-15 NOTE — Progress Notes (Signed)
CSW updated fl2 with wound care, and faxed updated discharge summary to [facility]. CSW confirmed with facility she is ready for admission to [facility]. CSW updated patient daughter who plans to visit patient in the morning at [facility] place. CSW awaiting call from rn to arrange transport once seat cushion and ointment is delievered.   Belia Heman, LCSW Weekend coverage [phone] 04/15/2014 12:36 PM

## 2014-04-15 NOTE — Consult Note (Signed)
WOC wound consult note Reason for Consult: Unstageable Pressure Ulcer at coccyx Wound type:Pressure Pressure Ulcer POA: No Measurement:2cm x 1.8cm Wound bed: Obscured by the presence of black tissue (Soft eschar) Drainage (amount, consistency, odor) None Periwound:intact, erythema that is blanchable Dressing procedure/placement/frequency:Santyl (collagenase) ointment for enzymatic debriding of the unstageable pressure ulcer on the coccyx.  Additionally, I will provide a pressure redistribution chair cushion for her use when OOB in chair; patient is able to turn side to side while in bed to avoid pressure. [place] nursing team will not follow, but will remain available to this patient, the nursing and medical teams.  Please re-consult if needed. Thanks, Maudie Flakes, MSN, RN, [place], [place], [place] [phone])

## 2014-04-15 NOTE — Progress Notes (Signed)
CARE MANAGEMENT NOTE 04/15/2014  Patient:  Shelly Martin, Shelly Martin   Account Number:  [id number]  Date Initiated:  04/15/2014  Documentation initiated by:  [facility]  Subjective/Objective Assessment:   acute encephalopathy     Action/Plan:   Anticipated DC Date:  04/15/2014   Anticipated DC Plan:  SKILLED NURSING FACILITY  In-house referral  Clinical Social Worker      DC Planning Services  CM consult      Choice offered to / List presented to:             Status of service:  Completed, signed off Medicare Important Message given?  NA - LOS <3 / Initial given by admissions (If response is "NO", the following Medicare IM given date fields will be blank) Date Medicare IM given:   Medicare IM given by:   Date Additional Medicare IM given:   Additional Medicare IM given by:    Discharge Disposition:  [place]  Per UR Regulation:    If discussed at Long Length of Stay Meetings, dates discussed:    Comments:  Plan dc to SNF. Jonnie Finner RN CCM Case Mgmt phone [phone]

## 2014-04-15 NOTE — Progress Notes (Signed)
Reported off to RN at [facility].

## 2014-04-15 NOTE — Progress Notes (Signed)
Patient was stable at time of discharge. EMTs took patients' belongings and wound care supplies with them.

## 2014-04-15 NOTE — Discharge Instructions (Signed)

## 2014-04-15 NOTE — Progress Notes (Signed)
Clinical Social Work Department CLINICAL SOCIAL WORK PLACEMENT NOTE 04/15/2014  Patient:  Shelly Martin, Shelly Martin  Account Number:  [id number] Admit date:  04/13/2014  Clinical Social Worker:  Maryln Manuel  Date/time:  04/14/2014 04:53 PM  Clinical Social Work is seeking post-discharge placement for this patient at the following level of care:   SKILLED NURSING   (*CSW will update this form in Epic as items are completed)   04/14/2014  Patient/family provided with [place] Department of Clinical Social Work's list of facilities offering this level of care within the geographic area requested by the patient (or if unable, by the patient's family).  04/14/2014  Patient/family informed of their freedom to choose among providers that offer the needed level of care, that participate in Medicare, Medicaid or managed care program needed by the patient, have an available bed and are willing to accept the patient.  04/14/2014  Patient/family informed of [facility]' ownership interest in [facility], as well as of the fact that they are under no obligation to receive care at this facility.  PASARR submitted to EDS on 04/14/2014 PASARR number received on 04/14/2014  FL2 transmitted to all facilities in geographic area requested by pt/family on  04/14/2014 FL2 transmitted to all facilities within larger geographic area on   Patient informed that his/her managed care company has contracts with or will negotiate with  certain facilities, including the following:     Patient/family informed of bed offers received:  04/14/2014 Patient chooses bed at [place] Physician recommends and patient chooses bed at    Patient to be transferred to [place] on  04/15/2014 Patient to be transferred to facility by ambulance Patient and family notified of transfer on 04/15/2014 Name of family member notified:  Jan daughter  The following physician request were entered in  Epic:   Additional Comments:  Dede Query, [place] Social Worker - Weekend Coverage cell #: [phone]

## 2014-04-17 ENCOUNTER — Encounter: Payer: Self-pay | Admitting: Internal Medicine

## 2014-04-17 ENCOUNTER — Non-Acute Institutional Stay (SKILLED_NURSING_FACILITY): Payer: Medicare Other | Admitting: Internal Medicine

## 2014-04-17 DIAGNOSIS — L89150 Pressure ulcer of sacral region, unstageable: Secondary | ICD-10-CM

## 2014-04-17 DIAGNOSIS — D638 Anemia in other chronic diseases classified elsewhere: Secondary | ICD-10-CM

## 2014-04-17 DIAGNOSIS — K5900 Constipation, unspecified: Secondary | ICD-10-CM

## 2014-04-17 DIAGNOSIS — N179 Acute kidney failure, unspecified: Secondary | ICD-10-CM

## 2014-04-17 DIAGNOSIS — R5381 Other malaise: Secondary | ICD-10-CM

## 2014-04-17 DIAGNOSIS — N3941 Urge incontinence: Secondary | ICD-10-CM

## 2014-04-17 DIAGNOSIS — N189 Chronic kidney disease, unspecified: Secondary | ICD-10-CM

## 2014-04-17 DIAGNOSIS — M160 Bilateral primary osteoarthritis of hip: Secondary | ICD-10-CM

## 2014-04-17 DIAGNOSIS — I10 Essential (primary) hypertension: Secondary | ICD-10-CM

## 2014-04-17 NOTE — Progress Notes (Signed)
Patient ID: Shelly Martin, female   DOB: Jan 03, 1919, 80 y.o.   MRN: [id number]     [facility]   PCP: [facility], [facility]  Code Status: full code  Allergies  Allergen Reactions  . Amoxicillin Other (See Comments)    Reaction unknown  . Baclofen Other (See Comments)    "fuzzy in the eyes" and dizzy  . Hctz [Hydrochlorothiazide]     nausea  . Valium [Diazepam] Other (See Comments)    Pt became unresponsive and O2 sats dropped.    Chief Complaint  Patient presents with  . New Admit To SNF     HPI:  80 y/o female pt is here for STR post hospital admission from 04/13/14-04/15/14 with acute encephalopathy in setting of polypharmacy. She was hypotensive and all her antihypertensives were held. She is seen in her room today.Today on review of her vital signs, her BP is elevated. She mentions being in pain but mentions that current pain regimen is keeping it under moderate control. She has been constipated She has PMH of HTN, CVA, CKD stage 2, moderate AS, right pubic ramus fracture and severe advanced right hip OA with chronic pain. She is getting wound care for her sacral wound  Review of Systems:  Constitutional: Negative for fever, diaphoresis.  HENT: Negative for congestion Respiratory: Negative for cough, sputum production, shortness of breath and wheezing.   Cardiovascular: Negative for chest pain, palpitations, orthopnea and leg swelling.  Gastrointestinal: Negative for heartburn, nausea, vomiting, abdominal pain Genitourinary: Negative for dysuria Musculoskeletal: Negative for falls Skin: Negative for itching, rash.  Neurological: Negative for dizziness, tingling, focal weakness and headaches.  Psychiatric/Behavioral: Negative for depression  Past Medical History  Diagnosis Date  . Hypertension   . Restless legs syndrome     on Requip  . Ulcer causing bleeding and hole in wall of stomach or small intestine   . Gastric ulcer       years ago  . Arthritis     Back, knees  . Constipation   . Urinary bladder calculus   . Urinary incontinence   . Bowel incontinence   . Aortic stenosis   . Stroke     Mini, no residual   Past Surgical History  Procedure Laterality Date  . Colon resection  50 years ago  . Tonsillectomy    . Uterine fibroid surgery    . Lumbar laminectomy/decompression microdiscectomy  06/25/2011    Procedure: LUMBAR LAMINECTOMY/DECOMPRESSION MICRODISCECTOMY;  Surgeon: Hosie Spangle, MD;  Location: [place] [facility];  Service: Neurosurgery;  Laterality: Right;  RIGHT Lumbar Two-Three hemilaminectomy and microdiskectomy  . Vaginal hysterectomy     Social History:   reports that she has never smoked. She has never used smokeless tobacco. She reports that she drinks alcohol. She reports that she does not use illicit drugs.  Family History  Problem Relation Age of Onset  . Anesthesia problems Neg Hx   . Pancreatic cancer Brother   . Dementia Mother   . Stroke Father     Medications: Patient's Medications  New Prescriptions   No medications on file  Previous Medications   ACETAMINOPHEN (TYLENOL) 325 MG TABLET    Take 2 tablets (650 mg total) by mouth 3 (three) times daily.   COLLAGENASE (SANTYL) OINTMENT    Apply topically daily.   DOCUSATE SODIUM 100 MG CAPS    Take 100 mg by mouth 2 (two) times daily.   FESOTERODINE (TOVIAZ) 8 MG TB24 TABLET  Take 1 tablet (8 mg total) by mouth daily.   GABAPENTIN (NEURONTIN) 300 MG CAPSULE    Take 1 capsule (300 mg total) by mouth 3 (three) times daily. Reduce your dose to 300 mg 3 x a day.   HYDROCODONE-ACETAMINOPHEN (NORCO/VICODIN) 5-325 MG PER TABLET    Take 1-2 tablets by mouth every 4 (four) hours as needed for moderate pain.   IBUPROFEN (ADVIL,MOTRIN) 200 MG TABLET    Take 200-400 mg by mouth 3 (three) times daily as needed for moderate pain.    LACTOSE FREE NUTRITION (BOOST PLUS) LIQD    Take 237 mLs by mouth 2 (two) times daily between meals.    POLYETHYLENE GLYCOL (MIRALAX / GLYCOLAX) PACKET    Take 17 g by mouth as needed for mild constipation.    POTASSIUM CHLORIDE SA (K-DUR,KLOR-CON) 20 MEQ TABLET    Take one tablet when you take your diuretic pill   ROPINIROLE (REQUIP) 2 MG TABLET    Take 2 tablets (4 mg total) by mouth at bedtime.  Modified Medications   No medications on file  Discontinued Medications   No medications on file     Physical Exam: Filed Vitals:   04/17/14 0849  BP: 152/74  Pulse: 82  Temp: 99 F (37.2 C)  Resp: 16  SpO2: 97%    General- elderly female in no acute distress Head- atraumatic, normocephalic Eyes- no pallor, no icterus, no discharge Neck- no cervical lymphadenopathy Throat- moist mucus membrane Cardiovascular- normal s1,s2, no murmurs Respiratory- bilateral clear to auscultation, no wheeze, no rhonchi Abdomen- bowel sounds present, soft, non tender Musculoskeletal- able to move all 4 extremities, limited ROM with right arm, no leg edema Neurological- no focal deficit Skin- warm and dry, pressure sore in sacral area with eschar Psychiatry- alert, normal mood and affect   Labs reviewed: Basic Metabolic Panel:  Recent Labs  04/13/14 2100 04/14/14 0446 04/15/14 0518  NA 136* 141 135*  K 5.0 5.2 5.1  CL 98 104 100  CO2 21 21 22   GLUCOSE 188* 106* 115*  BUN 80* 79* 78*  CREATININE 2.22* 2.13* 2.48*  CALCIUM 7.8* 7.7* 8.0*   Liver Function Tests:  Recent Labs  03/02/14 1557 03/16/14 1430 03/17/14 0616  AST 19 36 29  ALT 14 18 16   ALKPHOS 78 94 78  BILITOT 0.4 0.4 0.4  PROT 5.8* 6.8 5.4*  ALBUMIN 3.1* 3.6 2.9*   No results for input(s): LIPASE, AMYLASE in the last 8760 hours. No results for input(s): AMMONIA in the last 8760 hours. CBC:  Recent Labs  03/02/14 1557 03/16/14 1430  03/19/14 0510 04/13/14 1425 04/14/14 0446  WBC 6.4 6.1  < > 6.2 11.4* 7.9  NEUTROABS 4.6 4.4  --   --  10.1*  --   HGB 10.4* 10.8*  < > 10.4* 10.1* 8.6*  HCT 32.8* 34.2*  < >  32.5* 31.4* 26.6*  MCV 90.9 90.5  < > 89.5 90.8 90.8  PLT 228 231  < > 234 200 176  < > = values in this interval not displayed. Cardiac Enzymes:  Recent Labs  03/16/14 1840 03/17/14 0021 03/17/14 0616  TROPONINI <0.30 <0.30 <0.30   BNP: Invalid input(s): POCBNP CBG:  Recent Labs  03/19/14 0649 03/20/14 0623 03/21/14 0709  GLUCAP 103* 101* 103*    Assessment/Plan  Physical deconditioning Will have patient work with PT/OT as tolerated to regain strength and restore function.  Fall precautions are in place.  Acute encephalopathy Pt returned to baseline. Avoid  sedatives and hypnotics if possible    Sacral decubitus ulcer Continue wound care and pressure ulcer prophylaxis  HTN Her bp meds were held in hospital with hypotension. With bp being elevated will reintroduce norvasc at 10 mg daily and check bp q shift for now and reassess and adjust bp meds as needed   Iron deficiency anemia Monitor h&h  Severe OA Continue neurontin 300 mg tid and norco 5-325 1-2 tab q4 prn for now. D/c ibuprofen with impaired renal function  Constipation On DSS 100 mg bid and prn miralax. Change miralax to once a day  OAB Continue toviaz  Family/ staff Communication: reviewed care plan with patient and nursing supervisor   Goals of care: short term rehabilitation    Labs/tests ordered: cbc with diff, cmp    Blanchie Serve, MD  [facility] Adult Medicine [phone] (Monday-Friday 8 am - 5 pm) [phone] (afterhours)

## 2014-04-25 ENCOUNTER — Other Ambulatory Visit: Payer: Self-pay | Admitting: *Deleted

## 2014-04-25 MED ORDER — HYDROCODONE-ACETAMINOPHEN 5-325 MG PO TABS: ORAL_TABLET | ORAL | Status: DC

## 2014-04-25 NOTE — Telephone Encounter (Signed)
[facility] 

## 2014-05-01 ENCOUNTER — Ambulatory Visit: Payer: Medicare Other | Admitting: Internal Medicine

## 2014-05-04 ENCOUNTER — Encounter (HOSPITAL_BASED_OUTPATIENT_CLINIC_OR_DEPARTMENT_OTHER): Payer: Medicare Other | Attending: Internal Medicine

## 2014-05-08 ENCOUNTER — Encounter: Payer: Self-pay | Admitting: Adult Health

## 2014-05-08 ENCOUNTER — Non-Acute Institutional Stay (SKILLED_NURSING_FACILITY): Payer: Medicare Other | Admitting: Adult Health

## 2014-05-08 DIAGNOSIS — N3941 Urge incontinence: Secondary | ICD-10-CM

## 2014-05-08 DIAGNOSIS — L89154 Pressure ulcer of sacral region, stage 4: Secondary | ICD-10-CM

## 2014-05-08 DIAGNOSIS — K5900 Constipation, unspecified: Secondary | ICD-10-CM

## 2014-05-08 DIAGNOSIS — R5381 Other malaise: Secondary | ICD-10-CM

## 2014-05-08 DIAGNOSIS — M160 Bilateral primary osteoarthritis of hip: Secondary | ICD-10-CM

## 2014-05-08 DIAGNOSIS — G2581 Restless legs syndrome: Secondary | ICD-10-CM

## 2014-05-08 DIAGNOSIS — I10 Essential (primary) hypertension: Secondary | ICD-10-CM

## 2014-05-08 DIAGNOSIS — D638 Anemia in other chronic diseases classified elsewhere: Secondary | ICD-10-CM

## 2014-05-08 NOTE — Progress Notes (Signed)
Patient ID: Shelly Martin, female   DOB: Dec 22, 1918, 80 y.o.   MRN: [id number]   05/08/2014  Facility:  Nursing Home Location:  [place] Room Number: 901-2 LEVEL OF CARE:  SNF (31)   Chief Complaint  Patient presents with  . Discharge Note    Physical deconditioning, hypertension, constipation, urge incontinence, osteoarthritis of hips, anemia, pressure ulcer and restless leg syndrome    HISTORY OF PRESENT ILLNESS:  [name] who is for discharge home with home health PT, OT, SW and Nursing. DME:  O2 at 2 L/minute via [place] continuously, stationary and portable (gas), semi-electric hospital bed, standard wheelchair with cushion, leg rests and anti-tippers. She has been admitted to [facility] on 04/15/14 from [facility] with acute encephalopathy in setting of polypharmacy. Patient was admitted to this facility for short-term rehabilitation after the patient's recent hospitalization.  Patient has completed SNF rehabilitation and therapy has cleared the patient for discharge.  REASSESSMENT OF ONGOING PROBLEMS:  HTN: Pt 's HTN remains stable.  Denies CP, sob, DOE, headaches, dizziness or visual disturbances.  No complications from the medications currently being used.  Last BP : 127/54  ANEMIA: The anemia has been stable. The patient denies fatigue, melena or hematochezia.  12/15 hgb 9.7  CONSTIPATION: The constipation remains stable. No complications from the medications presently being used. Patient denies ongoing constipation, abdominal pain, nausea or vomiting.  PAST MEDICAL HISTORY:  Past Medical History  Diagnosis Date  . Hypertension   . Restless legs syndrome     on Requip  . Ulcer causing bleeding and hole in wall of stomach or small intestine   . Gastric ulcer     years ago  . Arthritis     Back, knees  . Constipation   . Urinary bladder calculus   . Urinary incontinence   . Bowel incontinence   . Aortic  stenosis   . Stroke     Mini, no residual    CURRENT MEDICATIONS: Reviewed per MAR/see medication list  Allergies  Allergen Reactions  . Amoxicillin Other (See Comments)    Reaction unknown  . Baclofen Other (See Comments)    "fuzzy in the eyes" and dizzy  . Hctz [Hydrochlorothiazide]     nausea  . Valium [Diazepam] Other (See Comments)    Pt became unresponsive and O2 sats dropped.     REVIEW OF SYSTEMS:  GENERAL: no change in appetite, no fatigue, no weight changes, no fever, chills or weakness RESPIRATORY: no cough, SOB, DOE, wheezing, hemoptysis CARDIAC: no chest pain, edema or palpitations GI: no abdominal pain, diarrhea, constipation, heart burn, nausea or vomiting  PHYSICAL EXAMINATION  GENERAL: no acute distress, normal body habitus SKIN:  Sacral pressure ulcer stage IV EYES: conjunctivae normal, sclerae normal, normal eye lids NECK: supple, trachea midline, no neck masses, no thyroid tenderness, no thyromegaly LYMPHATICS: no LAN in the neck, no supraclavicular LAN RESPIRATORY: breathing is even & unlabored, BS CTAB CARDIAC: RRR, no murmur,no extra heart sounds, no edema GI: abdomen soft, normal BS, no masses, no tenderness, no hepatomegaly, no splenomegaly EXTREMITIES: Able to move 4 extremities PSYCHIATRIC: the patient is alert & oriented to person, affect & behavior appropriate  LABS/RADIOLOGY:   05/02/14  AP view of pelvis shows moderate to severe degenerative changes to right hip 05/01/14  sodium 137 potassium 4.1 glucose 163 BUN 25 creatinine 0.8 calcium 8.6 total protein 5.7 albumin 3.1 total bilirubin 0.5  ALP 136 AST 29  ALT 31 WBC 12.9 and hemoglobin 9.7 hematocrit 31.1 MCV 92.3 04/19/14  chest x-ray shows no acute cardiac or pulmonary pathology 04/18/14  WBC 11.6 hemoglobin 10.2 hematocrit 32.8 MCV 94 sodium 139 potassium 4.7 glucose 118 BUN 49 creatinine 1.5 calcium 8.8 total protein 5.6 albumin 3.2 AST 18 ALT 47 ALP 137 Labs reviewed: Basic Metabolic  Panel:  Recent Labs  04/13/14 2100 04/14/14 0446 04/15/14 0518  NA 136* 141 135*  K 5.0 5.2 5.1  CL 98 104 100  CO2 21 21 22   GLUCOSE 188* 106* 115*  BUN 80* 79* 78*  CREATININE 2.22* 2.13* 2.48*  CALCIUM 7.8* 7.7* 8.0*   Liver Function Tests:  Recent Labs  03/02/14 1557 03/16/14 1430 03/17/14 0616  AST 19 36 29  ALT 14 18 16   ALKPHOS 78 94 78  BILITOT 0.4 0.4 0.4  PROT 5.8* 6.8 5.4*  ALBUMIN 3.1* 3.6 2.9*   CBC:  Recent Labs  03/02/14 1557 03/16/14 1430  03/19/14 0510 04/13/14 1425 04/14/14 0446  WBC 6.4 6.1  < > 6.2 11.4* 7.9  NEUTROABS 4.6 4.4  --   --  10.1*  --   HGB 10.4* 10.8*  < > 10.4* 10.1* 8.6*  HCT 32.8* 34.2*  < > 32.5* 31.4* 26.6*  MCV 90.9 90.5  < > 89.5 90.8 90.8  PLT 228 231  < > 234 200 176  < > = values in this interval not displayed.  Lipid Panel:  Recent Labs  03/06/14 1329  HDL 61   Cardiac Enzymes:  Recent Labs  03/16/14 1840 03/17/14 0021 03/17/14 0616  TROPONINI <0.30 <0.30 <0.30   BNP: Invalid input(s): POCBNP CBG:  Recent Labs  03/19/14 0649 03/20/14 0623 03/21/14 0709  GLUCAP 103* 101* 103*     Dg Chest Port 1 View  04/13/2014   CLINICAL DATA:  Altered mental status  EXAM: PORTABLE CHEST - 1 VIEW  COMPARISON:  03/16/2014  FINDINGS: There is no focal parenchymal opacity, pleural effusion, or pneumothorax. The heart and mediastinal contours are unremarkable.  There is osteoarthritis of the right glenohumeral joint.  IMPRESSION: No active disease.   Electronically Signed   By: Kathreen Devoid   On: 04/13/2014 21:19    ASSESSMENT/PLAN:  Physical deconditioning - for home health PT, OT, SW and Nursing Hypertension - well controlled; continue Norvasc 10 mg 1 tab by mouth daily Pressure ulcer stage IV, infected - continue doxycycline 100 mg by mouth twice a day 7 days and treatment; follow-up with wound clinic Constipation - stable; continue Colace 100 mg by mouth twice a day and MiraLAX by mouth daily Urge  incontinence - continue oxybutynin ER 10 mg by mouth daily Primary osteoarthritis of both hips - continue Lidoderm 5% patch daily and Norco 5/325 mg 1-2 tabs by mouth every 4 hours when necessary Anemia of chronic disease - stable; hemoglobin 9.7 Restless leg syndrome - continue Requip 4 mg 1 tab by mouth daily at bedtime   I have filled out patient's discharge paperwork and written prescriptions.  Patient will receive home health PT, OT, SW and Nursing.  DME provided:  O2 at 2 L/minute via [place] continuously, stationary and portable (gas), semi-electric hospital bed, standard wheelchair with cushion, leg rests and anti-tippers  Total discharge time: Greater than 30 minutes  Discharge time involved coordination of the discharge process with social worker, nursing staff and therapy department. Medical justification for home health services/DME verified.     [facility], NP [employer] [phone]

## 2014-05-25 ENCOUNTER — Encounter (HOSPITAL_COMMUNITY): Payer: Self-pay

## 2014-05-25 ENCOUNTER — Emergency Department (HOSPITAL_COMMUNITY): Payer: Medicare Other

## 2014-05-25 ENCOUNTER — Emergency Department (HOSPITAL_COMMUNITY)
Admission: EM | Admit: 2014-05-25 | Discharge: 2014-05-25 | Disposition: A | Discharge status: Home / Self Care | Payer: Medicare Other | Attending: Emergency Medicine | Admitting: Emergency Medicine

## 2014-05-25 DIAGNOSIS — S2241XA Multiple fractures of ribs, right side, initial encounter for closed fracture: Secondary | ICD-10-CM

## 2014-05-25 DIAGNOSIS — W19XXXA Unspecified fall, initial encounter: Secondary | ICD-10-CM

## 2014-05-25 DIAGNOSIS — I10 Essential (primary) hypertension: Secondary | ICD-10-CM | POA: Diagnosis not present

## 2014-05-25 DIAGNOSIS — S29091A Other injury of muscle and tendon of front wall of thorax, initial encounter: Secondary | ICD-10-CM | POA: Diagnosis present

## 2014-05-25 DIAGNOSIS — Z87448 Personal history of other diseases of urinary system: Secondary | ICD-10-CM | POA: Insufficient documentation

## 2014-05-25 DIAGNOSIS — Y998 Other external cause status: Secondary | ICD-10-CM | POA: Diagnosis not present

## 2014-05-25 DIAGNOSIS — W01198A Fall on same level from slipping, tripping and stumbling with subsequent striking against other object, initial encounter: Secondary | ICD-10-CM | POA: Diagnosis not present

## 2014-05-25 DIAGNOSIS — Y9289 Other specified places as the place of occurrence of the external cause: Secondary | ICD-10-CM | POA: Diagnosis not present

## 2014-05-25 DIAGNOSIS — Z880 Allergy status to penicillin: Secondary | ICD-10-CM | POA: Insufficient documentation

## 2014-05-25 DIAGNOSIS — Z791 Long term (current) use of non-steroidal anti-inflammatories (NSAID): Secondary | ICD-10-CM | POA: Diagnosis not present

## 2014-05-25 DIAGNOSIS — Z8673 Personal history of transient ischemic attack (TIA), and cerebral infarction without residual deficits: Secondary | ICD-10-CM | POA: Insufficient documentation

## 2014-05-25 DIAGNOSIS — M1990 Unspecified osteoarthritis, unspecified site: Secondary | ICD-10-CM | POA: Diagnosis not present

## 2014-05-25 DIAGNOSIS — G2581 Restless legs syndrome: Secondary | ICD-10-CM | POA: Insufficient documentation

## 2014-05-25 DIAGNOSIS — Y9389 Activity, other specified: Secondary | ICD-10-CM | POA: Insufficient documentation

## 2014-05-25 DIAGNOSIS — Z79899 Other long term (current) drug therapy: Secondary | ICD-10-CM | POA: Insufficient documentation

## 2014-05-25 DIAGNOSIS — Z8719 Personal history of other diseases of the digestive system: Secondary | ICD-10-CM | POA: Diagnosis not present

## 2014-05-25 LAB — URINALYSIS, ROUTINE W REFLEX MICROSCOPIC
Bilirubin Urine: NEGATIVE
Glucose, UA: NEGATIVE mg/dL
Hgb urine dipstick: NEGATIVE
Ketones, ur: NEGATIVE mg/dL
Nitrite: NEGATIVE
Protein, ur: NEGATIVE mg/dL
SPECIFIC GRAVITY, URINE: 1.022 (ref 1.005–1.030)
UROBILINOGEN UA: 0.2 mg/dL (ref 0.0–1.0)
pH: 5 (ref 5.0–8.0)

## 2014-05-25 LAB — URINE MICROSCOPIC-ADD ON

## 2014-05-25 MED ORDER — HYDROCODONE-ACETAMINOPHEN 10-325 MG PO TABS: 1.0000 | ORAL_TABLET | Freq: Four times a day (QID) | ORAL | Status: DC | PRN

## 2014-05-25 MED ORDER — FENTANYL CITRATE 0.05 MG/ML IJ SOLN
50.0000 ug | Freq: Once | INTRAMUSCULAR | Status: AC
  Administered 2014-05-25: 50 ug via INTRAMUSCULAR
  Filled 2014-05-25: qty 2

## 2014-05-25 MED ORDER — HYDROCODONE-ACETAMINOPHEN 5-325 MG PO TABS
1.0000 | ORAL_TABLET | Freq: Once | ORAL | Status: AC
Start: 2014-05-25 — End: 2014-05-25
  Administered 2014-05-25: 1 via ORAL
  Filled 2014-05-25: qty 1

## 2014-05-25 NOTE — ED Notes (Signed)
Pt instructed on IS use and pt with proper return demo. Pt tolerated well.

## 2014-05-25 NOTE — ED Notes (Signed)
Bed: YW31 Expected date: 05/25/14 Expected time: 1:01 AM Means of arrival: Ambulance Comments: 80 yo  Fall

## 2014-05-25 NOTE — ED Notes (Signed)
Patient fell at [place] on Monday morning.  She typically ambulates with a walker and wheelchair.  She fell on Monday morning (denies LOC or syncope) and reports lying on her right side for two hours before assistance arrived.  EMS reports a scratch with swelling to right flank area. Right shoulder pain.  Denies neck/back pain.  She reports pain is 7/10 on her right side when she is lying still/ 10/10 with movement.

## 2014-05-25 NOTE — ED Notes (Signed)
While using a bed pan, Pt reports burning with urination.

## 2014-05-25 NOTE — ED Notes (Signed)
[facility] called to transport pt back to facility awaiting arrival. Attempted to call [facility] x 3 no answer.

## 2014-05-25 NOTE — Discharge Instructions (Signed)
As discussed, please monitor your condition carefully, and do not hesitate to return here for any concerning changes.  Please use the provided incentive spirometer, 4 times daily for the next week.  Please use the prescribed medication as needed for pain control.  He may add ice packs for additional pain control

## 2014-05-25 NOTE — ED Notes (Signed)
Pt c/o pain on urination. Will get ua

## 2014-05-25 NOTE — ED Notes (Signed)
Spoke with pt's daughter and informed her of pt's status. No concerns at this time awaiting [facility] arrival to transport pt back to facility.

## 2014-05-25 NOTE — ED Provider Notes (Signed)
CSN: [id number]     Arrival date & time 05/25/14  0112 History   First MD Initiated Contact with Patient 05/25/14 0232     Chief Complaint  Patient presents with  . Fall     (Consider location/radiation/quality/duration/timing/severity/associated sxs/prior Treatment) HPI Patient presents 2 days of her mechanical fall with pain persistently on the right. Patient slipped, fell onto her right side. She denies head trauma, subsequent loss of consciousness, visual changes, confusion, disorientation. She also denies head pain, neck pain. She does have pain persistently in the right rib cage, right shoulder, right hip. She remains ambulatory, with no new instability, or weakness. Transient relief with hydrocodone.  Past Medical History  Diagnosis Date  . Hypertension   . Restless legs syndrome     on Requip  . Ulcer causing bleeding and hole in wall of stomach or small intestine   . Gastric ulcer     years ago  . Arthritis     Back, knees  . Constipation   . Urinary bladder calculus   . Urinary incontinence   . Bowel incontinence   . Aortic stenosis   . Stroke     Mini, no residual   Past Surgical History  Procedure Laterality Date  . Colon resection  50 years ago  . Tonsillectomy    . Uterine fibroid surgery    . Lumbar laminectomy/decompression microdiscectomy  06/25/2011    Procedure: LUMBAR LAMINECTOMY/DECOMPRESSION MICRODISCECTOMY;  Surgeon: Hosie Spangle, MD;  Location: [place] [facility];  Service: Neurosurgery;  Laterality: Right;  RIGHT Lumbar Two-Three hemilaminectomy and microdiskectomy  . Vaginal hysterectomy     Family History  Problem Relation Age of Onset  . Anesthesia problems Neg Hx   . Pancreatic cancer Brother   . Dementia Mother   . Stroke Father    History  Substance Use Topics  . Smoking status: Never Smoker   . Smokeless tobacco: Never Used  . Alcohol Use: Yes     Comment: rarely   OB History    No data available     Review of Systems   Constitutional:       Per HPI, otherwise negative  HENT:       Per HPI, otherwise negative  Respiratory:       Per HPI, otherwise negative  Cardiovascular:       Per HPI, otherwise negative  Gastrointestinal: Negative for vomiting.  Endocrine:       Negative aside from HPI  Genitourinary:       Neg aside from HPI   Musculoskeletal:       Per HPI, otherwise negative  Skin: Negative.   Neurological: Negative for syncope.      Allergies  Amoxicillin; Baclofen; Hctz; and Valium  Home Medications   Prior to Admission medications   Medication Sig Start Date End Date Taking? Authorizing Provider  acetaminophen (TYLENOL) 325 MG tablet Take 2 tablets (650 mg total) by mouth 3 (three) times daily. 03/21/14   Ripudeep Krystal Eaton, MD  collagenase (SANTYL) ointment Apply topically daily. 04/15/14   Venetia Maxon Rama, MD  docusate sodium 100 MG CAPS Take 100 mg by mouth 2 (two) times daily. 03/21/14   Ripudeep Krystal Eaton, MD  fesoterodine (TOVIAZ) 8 MG TB24 tablet Take 1 tablet (8 mg total) by mouth daily. 03/21/14   Ripudeep Krystal Eaton, MD  gabapentin (NEURONTIN) 300 MG capsule Take 1 capsule (300 mg total) by mouth 3 (three) times daily. Reduce your dose to 300 mg 3 x  a day. 04/14/14   Venetia Maxon Rama, MD  HYDROcodone-acetaminophen (NORCO/VICODIN) 5-325 MG per tablet Take one tablet by mouth every four hours scheduled for pain; Take one tablet by mouth every 4 hours as needed for breakthrough pain 04/25/14   Blanchie Serve, MD  ibuprofen (ADVIL,MOTRIN) 200 MG tablet Take 200-400 mg by mouth 3 (three) times daily as needed for moderate pain.     Historical Provider, MD  lactose free nutrition (BOOST PLUS) LIQD Take 237 mLs by mouth 2 (two) times daily between meals. 03/21/14   Ripudeep Krystal Eaton, MD  polyethylene glycol (MIRALAX / GLYCOLAX) packet Take 17 g by mouth as needed for mild constipation.  09/24/13   Historical Provider, MD  potassium chloride SA (K-DUR,KLOR-CON) 20 MEQ tablet Take one tablet when you  take your diuretic pill 03/06/14   Lanice Shirts, MD  rOPINIRole (REQUIP) 2 MG tablet Take 2 tablets (4 mg total) by mouth at bedtime. 04/07/14   Lanice Shirts, MD   BP 127/56 mmHg  Pulse 66  Temp(Src) 98.3 F (36.8 C) (Oral)  Resp 12  SpO2 98% Physical Exam  Constitutional: She is oriented to person, place, and time. She appears well-developed and well-nourished. No distress.  HENT:  Head: Normocephalic and atraumatic.  Eyes: Conjunctivae and EOM are normal.  Cardiovascular: Normal rate and regular rhythm.   Pulmonary/Chest: Effort normal and breath sounds normal. No stridor. No respiratory distress.  Abdominal: She exhibits no distension.  Musculoskeletal: She exhibits no edema.  Tender to palpation about the right inferior mid rib cage. No appreciable tenderness about the right hip, patient flexes the hip independently. Patient's right shoulder is diffusely tender.  Range of motion is limited secondary to pain.   Neurological: She is alert and oriented to person, place, and time. No cranial nerve deficit.  Skin: Skin is warm and dry.  Psychiatric: She has a normal mood and affect.  Nursing note and vitals reviewed.   ED Course  Procedures (including critical care time)  Imaging Review Dg Ribs Unilateral W/chest Right  05/25/2014   CLINICAL DATA:  Fall.  Right posterior chest abrasion.  EXAM: RIGHT RIBS AND CHEST - 3+ VIEW  COMPARISON:  04/13/2014  FINDINGS: Lateral right ninth and tenth rib fractures, nondisplaced. Although subtle, these fracture lucencies are convincing, and the ninth fracture is visible on the shoulder radiograph as well. No evidence of hemothorax, pneumothorax, or lung contusion. Stable cardiomegaly and aortic tortuosity. No edema or pneumonia.  IMPRESSION: Nondisplaced right ninth and tenth rib fractures.   Electronically Signed   By: Jorje Guild M.D.   On: 05/25/2014 04:09   Dg Shoulder Right  05/25/2014   CLINICAL DATA:  Fall with  right anterior shoulder pain.  EXAM: RIGHT SHOULDER - 2+ VIEW  COMPARISON:  None.  FINDINGS: No acute fracture or dislocation. The acromioclavicular joint is intact.  Advanced glenohumeral osteoarthritis with severe joint narrowing. There is also marginal spurring and large subchondral cyst formation in the humeral head.  Osteopenia.  IMPRESSION: 1. No acute osseous findings. 2. Advanced glenohumeral osteoarthritis.   Electronically Signed   By: Jorje Guild M.D.   On: 05/25/2014 04:13   Dg Hip Complete Right  05/25/2014   CLINICAL DATA:  [facility] at assisted living on 2 hardwood floor May 22, 2014  EXAM: RIGHT HIP - COMPLETE 2+ VIEW  COMPARISON:  RIGHT hip CT August 26, 2013  FINDINGS: Femoral heads are located. Severe RIGHT hip osteoarthrosis better seen on prior CT. Remote RIGHT  superior pubic ramus fracture corresponding to CT abnormality. Bone mineral density decreased without destructive bony lesions. Extensive surgical suture along anterior abdomen. RIGHT periarticular soft tissue planes are nonsuspicious.  IMPRESSION: No acute fracture deformity or dislocation. If there is high clinical suspicion for occult hip fracture or the patient refuses to bear weight, consider further evaluation with MRI. Although CT is expeditious, evidence is lacking regarding accuracy of CT over plain film radiography.  Severe RIGHT hip osteoarthrosis.   Electronically Signed   By: Elon Alas   On: 05/25/2014 04:09    Update: On repeat exam the patient is awake and alert, in no distress.  We discussed all findings.  Return precautions also discussed.  MDM   Final diagnoses:  Fall    Patient presents after mechanical fall with pain in the right side.  Patient is ambulatory.  X-rays demonstrate fracture of 2 ribs.  No evidence for respiratory compromise, nor imminent decompensation.  Patient discharged in stable condition.    Carmin Muskrat, MD 05/25/14 [phone]

## 2014-05-31 ENCOUNTER — Telehealth: Payer: Self-pay

## 2014-05-31 NOTE — Telephone Encounter (Signed)
I spoke with Mia from [facility] and advised that she contact the patients pain management group for pain med refills

## 2014-05-31 NOTE — Telephone Encounter (Signed)
Shelly Martin -- Nurse Edwyna Shell [phone]  Mia called to say Shelly Martin needs a refill on her pain medicine

## 2014-06-06 ENCOUNTER — Encounter: Payer: Medicare Other | Admitting: Internal Medicine

## 2014-06-06 DIAGNOSIS — Z08 Encounter for follow-up examination after completed treatment for malignant neoplasm: Secondary | ICD-10-CM

## 2014-06-06 NOTE — Progress Notes (Signed)
Subjective:    Patient ID: Shelly Martin, female    DOB: 05/05/1919, 81 y.o.   MRN: [id number]  HPI   05/08/2014  REHAB note HISTORY OF PRESENT ILLNESS: This is a 81 year old female who is for discharge home with home health PT, OT, SW and Nursing. DME: O2 at 2 L/minute via [place] continuously, stationary and portable (gas), semi-electric hospital bed, standard wheelchair with cushion, leg rests and anti-tippers. She has been admitted to [facility] on 04/15/14 from [facility] with acute encephalopathy in setting of polypharmacy. Patient was admitted to this facility for short-term rehabilitation after the patient's recent hospitalization. Patient has completed SNF rehabilitation and therapy has cleared the patient for discharge.  ASSESSMENT/PLAN:  Physical deconditioning - for home health PT, OT, SW and Nursing Hypertension - well controlled; continue Norvasc 10 mg 1 tab by mouth daily Pressure ulcer stage IV, infected - continue doxycycline 100 mg by mouth twice a day 7 days and treatment; follow-up with wound clinic Constipation - stable; continue Colace 100 mg by mouth twice a day and MiraLAX by mouth daily Urge incontinence - continue oxybutynin ER 10 mg by mouth daily Primary osteoarthritis of both hips - continue Lidoderm 5% patch daily and Norco 5/325 mg 1-2 tabs by mouth every 4 hours when necessary Anemia of chronic disease - stable; hemoglobin 9.7 Restless leg syndrome - continue Requip 4 mg 1 tab by mouth daily at bedtime   I have filled out patient's discharge paperwork and written prescriptions. Patient will receive home health PT, OT, SW and Nursing.  DME provided: O2 at 2 L/minute via [place] continuously, stationary and portable (gas), semi-electric hospital bed, standard wheelchair with cushion, leg rests and anti-tippers  Total discharge time: Greater than 30 minutes  Discharge time involved coordination of the discharge process with social worker, nursing  staff and therapy department. Medical justification for home health services/DME verified.  05/25/2014 ER note Patient presents after mechanical fall with pain in the right side. Patient is ambulatory. X-rays demonstrate fracture of 2 ribs. No evidence for respiratory compromise, nor imminent decompensation. Patient discharged in stable condition.  TODAY: Shelly Martin comes for follow up  Allergies  Allergen Reactions  . Amoxicillin Other (See Comments)    Reaction unknown  . Baclofen Other (See Comments)    "fuzzy in the eyes" and dizzy  . Hctz [Hydrochlorothiazide]     nausea  . Valium [Diazepam] Other (See Comments)    Pt became unresponsive and O2 sats dropped.   Past Medical History  Diagnosis Date  . Hypertension   . Restless legs syndrome     on Requip  . Ulcer causing bleeding and hole in wall of stomach or small intestine   . Gastric ulcer     years ago  . Arthritis     Back, knees  . Constipation   . Urinary bladder calculus   . Urinary incontinence   . Bowel incontinence   . Aortic stenosis   . Stroke     Mini, no residual   Past Surgical History  Procedure Laterality Date  . Colon resection  50 years ago  . Tonsillectomy    . Uterine fibroid surgery    . Lumbar laminectomy/decompression microdiscectomy  06/25/2011    Procedure: LUMBAR LAMINECTOMY/DECOMPRESSION MICRODISCECTOMY;  Surgeon: Hosie Spangle, MD;  Location: [place] [facility];  Service: Neurosurgery;  Laterality: Right;  RIGHT Lumbar Two-Three hemilaminectomy and microdiskectomy  . Vaginal hysterectomy     History   Social History  .  Marital Status: Widowed    Spouse Name: N/A    Number of Children: 2  . Years of Education: N/A   Occupational History  . Retired    Social History Main Topics  . Smoking status: Never Smoker   . Smokeless tobacco: Never Used  . Alcohol Use: Yes     Comment: rarely  . Drug Use: No  . Sexual Activity: Not Currently   Other Topics Concern  . Not on file    Social History Narrative   Family History  Problem Relation Age of Onset  . Anesthesia problems Neg Hx   . Pancreatic cancer Brother   . Dementia Mother   . Stroke Father    Patient Active Problem List   Diagnosis Date Noted  . Decubitus ulcer of sacral area 04/15/2014  . Moderate aortic stenosis 04/14/2014  . Pre-syncope 04/14/2014  . Hypotension 04/14/2014  . Acute on chronic renal failure 04/14/2014  . Anemia, iron deficiency 04/14/2014  . Protein-calorie malnutrition, severe 04/14/2014  . Altered mental status   . Dehydration 04/13/2014  . Altered mental state 04/13/2014  . Acute kidney injury   . Hypokalemia 03/25/2014  . Syncope 03/16/2014  . Acute encephalopathy 03/16/2014  . Leg swelling 03/02/2014  . Avascular necrosis of bone of right hip 09/27/2013  . Primary osteoarthritis of right hip 09/07/2013  . Spinal stenosis, lumbar region, with neurogenic claudication 09/07/2013  . Closed fracture of pubic ramus 09/07/2013  . BPPV (benign paroxysmal positional vertigo) 09/07/2013  . Pharyngeal swelling 08/30/2013  . Pelvic fracture 08/28/2013  . Fall 08/26/2013  . Dizziness 08/26/2013  . Abnormal stress test 08/26/2013  . Preop cardiovascular exam 08/01/2013  . Aortic stenosis 08/01/2013  . Edema 06/30/2013  . Loss of weight 01/04/2013  . Situational stress 01/04/2013  . Chronic right hip pain 07/05/2012  . Knee pain, acute 06/29/2012  . Vertigo 06/24/2012  . Acute exacerbation of chronic low back pain 06/24/2012  . Lumbar pain with radiation down right leg 06/09/2012  . Osteoarthritis of hip 04/29/2012  . History of TIAs 04/17/2012  . S/P total hysterectomy and bilateral salpingo-oophorectomy 04/17/2012  . Hard of hearing 04/17/2012  . History of anemia 04/17/2012  . RLS (restless legs syndrome) 04/17/2012  . Chronic back pain 04/17/2012  . Spinal stenosis, lumbar 04/17/2012  . Lower back pain 12/20/2011  . Personal history of colonic polyps 10/08/2011   . H/O: CVA (cardiovascular accident) 09/14/2011  . Hypertension 09/14/2011  . Restless legs syndrome 09/14/2011  . Constipation 08/20/2011  . Urinary incontinence 08/20/2011   Current Outpatient Prescriptions on File Prior to Visit  Medication Sig Dispense Refill  . acetaminophen (TYLENOL) 325 MG tablet Take 2 tablets (650 mg total) by mouth 3 (three) times daily.    . collagenase (SANTYL) ointment Apply topically daily. 15 g 0  . docusate sodium 100 MG CAPS Take 100 mg by mouth 2 (two) times daily. 10 capsule 0  . fesoterodine (TOVIAZ) 8 MG TB24 tablet Take 1 tablet (8 mg total) by mouth daily. 30 tablet 2  . gabapentin (NEURONTIN) 300 MG capsule Take 1 capsule (300 mg total) by mouth 3 (three) times daily. Reduce your dose to 300 mg 3 x a day.    Marland Kitchen HYDROcodone-acetaminophen (NORCO) 10-325 MG per tablet Take 1 tablet by mouth every 6 (six) hours as needed. 20 tablet 0  . ibuprofen (ADVIL,MOTRIN) 200 MG tablet Take 200-400 mg by mouth 3 (three) times daily as needed for moderate pain.     Marland Kitchen  lactose free nutrition (BOOST PLUS) LIQD Take 237 mLs by mouth 2 (two) times daily between meals.  0  . polyethylene glycol (MIRALAX / GLYCOLAX) packet Take 17 g by mouth as needed for mild constipation.     . potassium chloride SA (K-DUR,KLOR-CON) 20 MEQ tablet Take one tablet when you take your diuretic pill 30 tablet 1  . rOPINIRole (REQUIP) 2 MG tablet Take 2 tablets (4 mg total) by mouth at bedtime. 60 tablet 1   No current facility-administered medications on file prior to visit.      Review of Systems See HPI    Objective:   Physical Exam Physical Exam  Nursing note and vitals reviewed.  Constitutional: She is oriented to person, place, and time. She appears well-developed and well-nourished.  HENT:  Head: Normocephalic and atraumatic.  Cardiovascular: Normal rate and regular rhythm. Exam reveals no gallop and no friction rub.  No murmur heard.  Pulmonary/Chest: Breath sounds normal.  She has no wheezes. She has no rales.  Neurological: She is alert and oriented to person, place, and time.  Skin: Skin is warm and dry.  Psychiatric: She has a normal mood and affect. Her behavior is normal.              Assessment & Plan:

## 2014-06-28 ENCOUNTER — Other Ambulatory Visit: Payer: Self-pay | Admitting: Internal Medicine

## 2014-06-28 ENCOUNTER — Encounter: Payer: Self-pay | Admitting: Internal Medicine

## 2014-06-28 ENCOUNTER — Ambulatory Visit (INDEPENDENT_AMBULATORY_CARE_PROVIDER_SITE_OTHER): Payer: Medicare Other | Admitting: Internal Medicine

## 2014-06-28 VITALS — BP 112/56 | HR 83 | Resp 16

## 2014-06-28 DIAGNOSIS — I10 Essential (primary) hypertension: Secondary | ICD-10-CM

## 2014-06-28 DIAGNOSIS — R609 Edema, unspecified: Secondary | ICD-10-CM

## 2014-06-28 DIAGNOSIS — G2581 Restless legs syndrome: Secondary | ICD-10-CM

## 2014-06-28 DIAGNOSIS — M1611 Unilateral primary osteoarthritis, right hip: Secondary | ICD-10-CM

## 2014-06-28 LAB — CBC WITH DIFFERENTIAL/PLATELET
BASOS ABS: 0 10*3/uL (ref 0.0–0.1)
Basophils Relative: 0 % (ref 0–1)
Eosinophils Absolute: 0 10*3/uL (ref 0.0–0.7)
Eosinophils Relative: 0 % (ref 0–5)
HCT: 30.8 % — ABNORMAL LOW (ref 36.0–46.0)
Hemoglobin: 9.9 g/dL — ABNORMAL LOW (ref 12.0–15.0)
LYMPHS ABS: 1.1 10*3/uL (ref 0.7–4.0)
LYMPHS PCT: 14 % (ref 12–46)
MCH: 28.5 pg (ref 26.0–34.0)
MCHC: 32.1 g/dL (ref 30.0–36.0)
MCV: 88.8 fL (ref 78.0–100.0)
MPV: 8.5 fL — AB (ref 8.6–12.4)
Monocytes Absolute: 0.4 10*3/uL (ref 0.1–1.0)
Monocytes Relative: 5 % (ref 3–12)
NEUTROS PCT: 81 % — AB (ref 43–77)
Neutro Abs: 6.6 10*3/uL (ref 1.7–7.7)
Platelets: 336 10*3/uL (ref 150–400)
RBC: 3.47 MIL/uL — ABNORMAL LOW (ref 3.87–5.11)
RDW: 14.4 % (ref 11.5–15.5)
WBC: 8.2 10*3/uL (ref 4.0–10.5)

## 2014-06-28 LAB — COMPREHENSIVE METABOLIC PANEL
ALK PHOS: 94 U/L (ref 39–117)
ALT: <8 U/L (ref 0–35)
AST: 14 U/L (ref 0–37)
Albumin: 3 g/dL — ABNORMAL LOW (ref 3.5–5.2)
BUN: 19 mg/dL (ref 6–23)
CALCIUM: 8.2 mg/dL — AB (ref 8.4–10.5)
CHLORIDE: 99 meq/L (ref 96–112)
CO2: 31 meq/L (ref 19–32)
Creat: 0.78 mg/dL (ref 0.50–1.10)
GLUCOSE: 165 mg/dL — AB (ref 70–99)
POTASSIUM: 4.7 meq/L (ref 3.5–5.3)
SODIUM: 137 meq/L (ref 135–145)
Total Bilirubin: 0.5 mg/dL (ref 0.2–1.2)
Total Protein: 5.4 g/dL — ABNORMAL LOW (ref 6.0–8.3)

## 2014-06-28 NOTE — Progress Notes (Signed)
Subjective:    Patient ID: Shelly Martin, female    DOB: [birth year], 81 y.o.   MRN: [id number]  HPI  05/25/2014 ER note Final diagnoses:  Fall    Patient presents after mechanical fall with pain in the right side. Patient is ambulatory. X-rays demonstrate fracture of 2 ribs. No evidence for respiratory compromise, nor imminent decompensation. Patient discharged in stable condition.    Carmin Muskrat, MD 05/25/14 [phone]     TODAY:  Shelly Martin is here for follow up  I have not seen her in a while.    Since last visit she now resides at [facility] assisted living.  Pt was dropped off here without any medication list and pt does not know her meds.  Her daughther fills her weekly pill box.    See ER  note above where she fell and fractured 2 ribs  Rib fx  Breathing fine but pain comes and goes  Appetite fair   She ambulates with a walker at AL facility   Has some LE edema at end of day   Pain meds for lumbar stenosis managed by Dr.Ramos and Dr. Maryjean Ka    Allergies  Allergen Reactions  . Amoxicillin Other (See Comments)    Reaction unknown  . Baclofen Other (See Comments)    "fuzzy in the eyes" and dizzy  . Hctz [Hydrochlorothiazide]     nausea  . Valium [Diazepam] Other (See Comments)    Pt became unresponsive and O2 sats dropped.   Past Medical History  Diagnosis Date  . Hypertension   . Restless legs syndrome     on Requip  . Ulcer causing bleeding and hole in wall of stomach or small intestine   . Gastric ulcer     years ago  . Arthritis     Back, knees  . Constipation   . Urinary bladder calculus   . Urinary incontinence   . Bowel incontinence   . Aortic stenosis   . Stroke     Mini, no residual   Past Surgical History  Procedure Laterality Date  . Colon resection  50 years ago  . Tonsillectomy    . Uterine fibroid surgery    . Lumbar laminectomy/decompression microdiscectomy  06/25/2011    Procedure: LUMBAR LAMINECTOMY/DECOMPRESSION  MICRODISCECTOMY;  Surgeon: Hosie Spangle, MD;  Location: [place] [facility];  Service: Neurosurgery;  Laterality: Right;  RIGHT Lumbar Two-Three hemilaminectomy and microdiskectomy  . Vaginal hysterectomy     History   Social History  . Marital Status: Widowed    Spouse Name: N/A    Number of Children: 2  . Years of Education: N/A   Occupational History  . Retired    Social History Main Topics  . Smoking status: Never Smoker   . Smokeless tobacco: Never Used  . Alcohol Use: Yes     Comment: rarely  . Drug Use: No  . Sexual Activity: Not Currently   Other Topics Concern  . Not on file   Social History Narrative   Family History  Problem Relation Age of Onset  . Anesthesia problems Neg Hx   . Pancreatic cancer Brother   . Dementia Mother   . Stroke Father    Patient Active Problem List   Diagnosis Date Noted  . Decubitus ulcer of sacral area 04/15/2014  . Moderate aortic stenosis 04/14/2014  . Pre-syncope 04/14/2014  . Hypotension 04/14/2014  . Acute on chronic renal failure 04/14/2014  . Anemia, iron deficiency 04/14/2014  . Protein-calorie  malnutrition, severe 04/14/2014  . Altered mental status   . Dehydration 04/13/2014  . Altered mental state 04/13/2014  . Acute kidney injury   . Hypokalemia 03/25/2014  . Syncope 03/16/2014  . Acute encephalopathy 03/16/2014  . Leg swelling 03/02/2014  . Avascular necrosis of bone of right hip 09/27/2013  . Primary osteoarthritis of right hip 09/07/2013  . Spinal stenosis, lumbar region, with neurogenic claudication 09/07/2013  . Closed fracture of pubic ramus 09/07/2013  . BPPV (benign paroxysmal positional vertigo) 09/07/2013  . Pharyngeal swelling 08/30/2013  . Pelvic fracture 08/28/2013  . Fall 08/26/2013  . Dizziness 08/26/2013  . Abnormal stress test 08/26/2013  . Preop cardiovascular exam 08/01/2013  . Aortic stenosis 08/01/2013  . Edema 06/30/2013  . Loss of weight 01/04/2013  . Situational stress 01/04/2013    . Chronic right hip pain 07/05/2012  . Knee pain, acute 06/29/2012  . Vertigo 06/24/2012  . Acute exacerbation of chronic low back pain 06/24/2012  . Lumbar pain with radiation down right leg 06/09/2012  . Osteoarthritis of hip 04/29/2012  . History of TIAs 04/17/2012  . S/P total hysterectomy and bilateral salpingo-oophorectomy 04/17/2012  . Hard of hearing 04/17/2012  . History of anemia 04/17/2012  . RLS (restless legs syndrome) 04/17/2012  . Chronic back pain 04/17/2012  . Spinal stenosis, lumbar 04/17/2012  . Lower back pain 12/20/2011  . Personal history of colonic polyps 10/08/2011  . H/O: CVA (cardiovascular accident) 09/14/2011  . Hypertension 09/14/2011  . Restless legs syndrome 09/14/2011  . Constipation 08/20/2011  . Urinary incontinence 08/20/2011   Current Outpatient Prescriptions on File Prior to Visit  Medication Sig Dispense Refill  . acetaminophen (TYLENOL) 325 MG tablet Take 2 tablets (650 mg total) by mouth 3 (three) times daily.    . collagenase (SANTYL) ointment Apply topically daily. 15 g 0  . docusate sodium 100 MG CAPS Take 100 mg by mouth 2 (two) times daily. 10 capsule 0  . fesoterodine (TOVIAZ) 8 MG TB24 tablet Take 1 tablet (8 mg total) by mouth daily. 30 tablet 2  . gabapentin (NEURONTIN) 300 MG capsule Take 1 capsule (300 mg total) by mouth 3 (three) times daily. Reduce your dose to 300 mg 3 x a day.    Marland Kitchen HYDROcodone-acetaminophen (NORCO) 10-325 MG per tablet Take 1 tablet by mouth every 6 (six) hours as needed. 20 tablet 0  . ibuprofen (ADVIL,MOTRIN) 200 MG tablet Take 200-400 mg by mouth 3 (three) times daily as needed for moderate pain.     Marland Kitchen lactose free nutrition (BOOST PLUS) LIQD Take 237 mLs by mouth 2 (two) times daily between meals.  0  . polyethylene glycol (MIRALAX / GLYCOLAX) packet Take 17 g by mouth as needed for mild constipation.     . potassium chloride SA (K-DUR,KLOR-CON) 20 MEQ tablet Take one tablet when you take your diuretic pill  30 tablet 1  . rOPINIRole (REQUIP) 2 MG tablet Take 2 tablets (4 mg total) by mouth at bedtime. 60 tablet 1   No current facility-administered medications on file prior to visit.      Review of Systems See HPI    Objective:   Physical Exam  Physical Exam  Nursing note and vitals reviewed.   Alert  Thin  Constitutional: She is oriented to person, place, and time. She appears well-developed and well-nourished.  HENT:  Head: Normocephalic and atraumatic.  Cardiovascular: Normal rate and regular rhythm. Exam reveals no gallop and no friction rub.  No murmur heard.  Pulmonary/Chest: Breath sounds normal. She has no wheezes. She has no rales.  Neurological: She is alert and oriented to person, place, and time.  Skin: Skin is warm and dry.  EXt   1+  Edema bilaterally  Good bilateral pp Psychiatric: She has a normal mood and affect. Her behavior is normal.       Assessment & Plan:  Edema  Advised to get knee high compression stockings.  Margaretha Sheffield spoke with daughter via phone who states "pt will not wear them"   Too risky at her age and debilitation to place on diuretic.    Daughter to call us back with updated dose of amlodipine which likely contributing to edema.    HTN  Continue Novasc for now  Daughter advised we need an updated list of medications and dosages .  Daughther tells Margaretha Sheffield she will call back   Lumbar stenosis  OA of hip /  Chronic pain   Managed by pain managemen  Recent  R rib fx  Healing  Good air flow   See me in 2-3 months or sooner prn   Pt does not wish to schedule now

## 2014-06-28 NOTE — Patient Instructions (Signed)
For her leg swelling   She will need Knee high compression hose     Please bring list of all medications to her doctor visits  To lab today

## 2014-06-29 ENCOUNTER — Other Ambulatory Visit: Payer: Self-pay | Admitting: Internal Medicine

## 2014-06-29 LAB — IRON AND TIBC
%SAT: 7 % — ABNORMAL LOW (ref 20–55)
Iron: 20 ug/dL — ABNORMAL LOW (ref 42–145)
TIBC: 272 ug/dL (ref 250–470)
UIBC: 252 ug/dL (ref 125–400)

## 2014-06-29 LAB — TSH: TSH: 1.438 u[IU]/mL (ref 0.350–4.500)

## 2014-06-29 LAB — FOLATE: Folate: 17.9 ng/mL

## 2014-06-29 LAB — FERRITIN: Ferritin: 47 ng/mL (ref 10–291)

## 2014-06-29 LAB — RETICULOCYTES
ABS RETIC: 20.5 10*3/uL (ref 19.0–186.0)
RBC.: 3.42 MIL/uL — ABNORMAL LOW (ref 3.87–5.11)
RETIC CT PCT: 0.6 % (ref 0.4–2.3)

## 2014-06-29 LAB — HEMOGLOBIN A1C
HEMOGLOBIN A1C: 5.6 % (ref <5.7)
Mean Plasma Glucose: 114 mg/dL (ref <117)

## 2014-06-29 LAB — VITAMIN B12: Vitamin B-12: 435 pg/mL (ref 211–911)

## 2014-06-29 NOTE — Telephone Encounter (Signed)
Refill request

## 2014-06-29 NOTE — Progress Notes (Signed)
Additional labs were added -eh

## 2014-07-02 ENCOUNTER — Other Ambulatory Visit: Payer: Self-pay | Admitting: Nurse Practitioner

## 2014-07-05 ENCOUNTER — Telehealth: Payer: Self-pay | Admitting: Internal Medicine

## 2014-07-05 NOTE — Telephone Encounter (Signed)
Left message on daughter's mobile to call office  Regarding lab results

## 2014-07-07 ENCOUNTER — Telehealth: Payer: Self-pay | Admitting: *Deleted

## 2014-07-07 ENCOUNTER — Other Ambulatory Visit: Payer: Self-pay | Admitting: Internal Medicine

## 2014-07-07 MED ORDER — INTEGRA 62.5-62.5-40-3 MG PO CAPS: ORAL_CAPSULE | ORAL | Status: DC

## 2014-07-07 NOTE — Telephone Encounter (Signed)
-----   Message from Lanice Shirts, MD sent at 07/07/2014 10:53 AM EST ----- Call pts daughter and let her know that her mother is anemic and I want to put her on an iron pill 3 times a week.  Will send in a RX for generic integra one tab M, WE, Friday.  Let assisted living facility know as well  Route back that you were able to reach her daughter  OK to call daughter at work

## 2014-07-07 NOTE — Telephone Encounter (Signed)
I left Ms. Deloach's daughter a message to call the office in regards to her mothers lab results

## 2014-07-17 ENCOUNTER — Telehealth: Payer: Self-pay | Admitting: *Deleted

## 2014-07-17 NOTE — Telephone Encounter (Signed)
-----   Message from Lanice Shirts, MD sent at 07/07/2014 10:53 AM EST ----- Call pts daughter and let her know that her mother is anemic and I want to put her on an iron pill 3 times a week.  Will send in a RX for generic integra one tab M, WE, Friday.  Let assisted living facility know as well  Route back that you were able to reach her daughter  OK to call daughter at work

## 2014-07-17 NOTE — Telephone Encounter (Signed)
I have left several messages over the past two weeks for Ms. Berlowitz's daughter, however she has not returned my call.

## 2014-07-24 ENCOUNTER — Telehealth: Payer: Self-pay | Admitting: Internal Medicine

## 2014-07-24 NOTE — Telephone Encounter (Signed)
Left message for daughter to call office regarding mother's lab work  Will also have Margaretha Sheffield notify [facility] that I wish to place pt on a new medicaion

## 2014-07-24 NOTE — Telephone Encounter (Signed)
Shelly Martin   Call [facility] facility and let them know I wish to place pt on an iron pill and have been unable to contact her Daughter  [name].  Ask if they have another contact number for daughter.  Does pt have a phone in her room that I can call pt?

## 2014-07-26 ENCOUNTER — Telehealth: Payer: Self-pay | Admitting: Internal Medicine

## 2014-07-26 MED ORDER — INTEGRA 62.5-62.5-40-3 MG PO CAPS: ORAL_CAPSULE | ORAL | Status: DC

## 2014-07-26 NOTE — Telephone Encounter (Signed)
Spoke with pts daughter and informed of anemia    Will start Integra daily  Daughter will pick up for pt

## 2014-09-01 ENCOUNTER — Encounter: Payer: Self-pay | Admitting: Internal Medicine

## 2014-09-01 ENCOUNTER — Non-Acute Institutional Stay (SKILLED_NURSING_FACILITY): Payer: Medicare Other | Admitting: Internal Medicine

## 2014-09-01 ENCOUNTER — Encounter: Payer: Self-pay | Admitting: *Deleted

## 2014-09-01 DIAGNOSIS — L89150 Pressure ulcer of sacral region, unstageable: Secondary | ICD-10-CM

## 2014-09-01 DIAGNOSIS — G2581 Restless legs syndrome: Secondary | ICD-10-CM | POA: Diagnosis not present

## 2014-09-01 NOTE — Assessment & Plan Note (Signed)
Pt admitted to SNF on no reported BP meds;accoerding to EPIC she was on norvasc 5 mg BID so will restart for her very high BP

## 2014-09-01 NOTE — Progress Notes (Addendum)
MRN: [id number] Name: Shelly Martin  Sex: female Age: 81 y.o. DOB: [birth year]  [place] #: [name] Facility/Room:104 Level Of Care: SNF Provider: Inocencio Homes D Emergency Contacts: Extended Emergency Contact Information Primary Emergency Contact: [facility] Address: [place], [place] [address] [place] Phone: [phone] Relation: Daughter Secondary Emergency Contact: [place], [place] Mobile Phone: [phone] Relation: Son  Code Status:DNR   Allergies: Amoxicillin; Baclofen; Hctz; and Valium  Chief Complaint  Patient presents with  . New Admit To SNF    HPI: Patient is 81 y.o. female who is admitted form her assisted living facility for wound care of a stage 4 decubitus ulcer.  Past Medical History  Diagnosis Date  . Hypertension   . Restless legs syndrome     on Requip  . Ulcer causing bleeding and hole in wall of stomach or small intestine   . Gastric ulcer     years ago  . Arthritis     Back, knees  . Constipation   . Urinary bladder calculus   . Urinary incontinence   . Bowel incontinence   . Aortic stenosis   . Stroke     Mini, no residual    Past Surgical History  Procedure Laterality Date  . Colon resection  50 years ago  . Tonsillectomy    . Uterine fibroid surgery    . Lumbar laminectomy/decompression microdiscectomy  06/25/2011    Procedure: LUMBAR LAMINECTOMY/DECOMPRESSION MICRODISCECTOMY;  Surgeon: Hosie Spangle, MD;  Location: [facility];  Service: Neurosurgery;  Laterality: Right;  RIGHT Lumbar Two-Three hemilaminectomy and microdiskectomy  . Vaginal hysterectomy        Medication List       This list is accurate as of: 09/01/14 11:59 PM.  Always use your most recent med list.               amLODipine 5 MG tablet  Commonly known as:  NORVASC  Take 5 mg by mouth 2 (two) times daily.     gabapentin 300 MG capsule  Commonly known  as:  NEURONTIN  Take 1 capsule (300 mg total) by mouth 3 (three) times daily. Reduce your dose to 300 mg 3 x a day.     HYDROmorphone 4 MG tablet  Commonly known as:  DILAUDID  Take 4 mg by mouth 3 (three) times daily as needed for severe pain.     lactose free nutrition Liqd  Take 237 mLs by mouth 2 (two) times daily between meals.     morphine 15 MG tablet  Commonly known as:  MSIR  Take 15 mg by mouth 2 (two) times daily.     polyethylene glycol packet  Commonly known as:  MIRALAX / GLYCOLAX  Take 17 g by mouth as needed for mild constipation.     rOPINIRole 2 MG tablet  Commonly known as:  REQUIP  TAKE 1-2 TABLETS BY MOUTH TWICE DAILY AS NEEDED (RESTLESS LEG).        No orders of the defined types were placed in this encounter.    Immunization History  Administered Date(s) Administered  . Influenza Split 04/15/2012  . Influenza,inj,Quad PF,36+ Mos 03/06/2014  . Pneumococcal Polysaccharide-23 08/29/2013    History  Substance Use Topics  . Smoking status: Never Smoker   . Smokeless tobacco: Never Used  . Alcohol Use: Yes  Comment: rarely    Family history is noncontributory    Review of Systems  DATA OBTAINED: from patient, nurse, medical record, family member GENERAL:  no fevers, fatigue, appetite changes SKIN: pain with decubitus ulcer EYES: No eye pain, redness, discharge EARS: No earache, tinnitus, change in hearing NOSE: No congestion, drainage or bleeding  MOUTH/THROAT: No mouth or tooth pain, No sore throat RESPIRATORY: No cough, wheezing, SOB CARDIAC: No chest pain, palpitations, lower extremity edema  GI: No abdominal pain, No N/V/D or constipation, No heartburn or reflux  GU: No dysuria, frequency or urgency, or incontinence  MUSCULOSKELETAL: No unrelieved bone/joint pain NEUROLOGIC: No headache, dizziness or focal weakness PSYCHIATRIC: No overt anxiety or sadness, No behavior issue.   Filed Vitals:   09/01/14 1222  BP: 193/82  Pulse: 82   Temp: 97.2 F (36.2 C)  Resp: 18    Physical Exam  GENERAL APPEARANCE: Alert, conversant,  No acute distress.  SKIN: No diaphoresis rash HEAD: Normocephalic, atraumatic  EYES: Conjunctiva/lids clear. Pupils round, reactive. EOMs intact.  EARS: External exam WNL, canals clear. Hearing grossly normal.  NOSE: No deformity or discharge.  MOUTH/THROAT: Lips w/o lesions  RESPIRATORY: Breathing is even, unlabored. Lung sounds are clear   CARDIOVASCULAR: Heart RRR  4/6 murmur,no  rubs or gallops. No peripheral edema.   GASTROINTESTINAL: Abdomen is soft, non-tender, not distended w/ normal bowel sounds. GENITOURINARY: Bladder non tender, not distended  MUSCULOSKELETAL: No abnormal joints or musculature NEUROLOGIC:  Cranial nerves 2-12 grossly intact. Moves all extremities  PSYCHIATRIC: Mood and affect appropriate to situation, no behavioral issues  Patient Active Problem List   Diagnosis Date Noted  . Decubitus ulcer of sacral area 04/15/2014  . Moderate aortic stenosis 04/14/2014  . Pre-syncope 04/14/2014  . Hypotension 04/14/2014  . Acute on chronic renal failure 04/14/2014  . Anemia, iron deficiency 04/14/2014  . Protein-calorie malnutrition, severe 04/14/2014  . Altered mental status   . Dehydration 04/13/2014  . Altered mental state 04/13/2014  . Acute kidney injury   . Hypokalemia 03/25/2014  . Syncope 03/16/2014  . Acute encephalopathy 03/16/2014  . Leg swelling 03/02/2014  . Avascular necrosis of bone of right hip 09/27/2013  . Primary osteoarthritis of right hip 09/07/2013  . Spinal stenosis, lumbar region, with neurogenic claudication 09/07/2013  . Closed fracture of pubic ramus 09/07/2013  . BPPV (benign paroxysmal positional vertigo) 09/07/2013  . Pharyngeal swelling 08/30/2013  . Pelvic fracture 08/28/2013  . Fall 08/26/2013  . Dizziness 08/26/2013  . Abnormal stress test 08/26/2013  . Preop cardiovascular exam 08/01/2013  . Aortic stenosis 08/01/2013  . Edema  06/30/2013  . Loss of weight 01/04/2013  . Situational stress 01/04/2013  . Chronic right hip pain 07/05/2012  . Knee pain, acute 06/29/2012  . Vertigo 06/24/2012  . Acute exacerbation of chronic low back pain 06/24/2012  . Lumbar pain with radiation down right leg 06/09/2012  . Osteoarthritis of hip 04/29/2012  . History of TIAs 04/17/2012  . S/P total hysterectomy and bilateral salpingo-oophorectomy 04/17/2012  . Hard of hearing 04/17/2012  . History of anemia 04/17/2012  . RLS (restless legs syndrome) 04/17/2012  . Chronic back pain 04/17/2012  . Spinal stenosis, lumbar 04/17/2012  . Lower back pain 12/20/2011  . Personal history of colonic polyps 10/08/2011  . H/O: CVA (cardiovascular accident) 09/14/2011  . Hypertension 09/14/2011  . Restless legs syndrome 09/14/2011  . Constipation 08/20/2011  . Urinary incontinence 08/20/2011    CBC    Component Value Date/Time   WBC  8.2 06/28/2014 1205   RBC 3.47* 06/28/2014 1205   RBC 3.42* 06/28/2014 1205   HGB 9.9* 06/28/2014 1205   HCT 30.8* 06/28/2014 1205   PLT 336 06/28/2014 1205   MCV 88.8 06/28/2014 1205   LYMPHSABS 1.1 06/28/2014 1205   MONOABS 0.4 06/28/2014 1205   EOSABS 0.0 06/28/2014 1205   BASOSABS 0.0 06/28/2014 1205    CMP     Component Value Date/Time   NA 137 06/28/2014 1205   K 4.7 06/28/2014 1205   CL 99 06/28/2014 1205   CO2 31 06/28/2014 1205   GLUCOSE 165* 06/28/2014 1205   BUN 19 06/28/2014 1205   CREATININE 0.78 06/28/2014 1205   CREATININE 2.48* 04/15/2014 0518   CALCIUM 8.2* 06/28/2014 1205   PROT 5.4* 06/28/2014 1205   ALBUMIN 3.0* 06/28/2014 1205   AST 14 06/28/2014 1205   ALT <8 06/28/2014 1205   ALKPHOS 94 06/28/2014 1205   BILITOT 0.5 06/28/2014 1205   GFRNONAA 16* 04/15/2014 0518   GFRAA 18* 04/15/2014 0518    Assessment and Plan  Decubitus ulcer of sacral area Noted   Hypertension Pt admitted to SNF on no reported BP meds;accoerding to EPIC she was on norvasc 5 mg BID so  will restart for her very high BP   Restless legs syndrome Pt takes requip nightly;Plan-will continue     [name], MD

## 2014-09-01 NOTE — Assessment & Plan Note (Signed)
Noted  

## 2014-09-03 ENCOUNTER — Encounter: Payer: Self-pay | Admitting: Internal Medicine

## 2014-09-03 NOTE — Assessment & Plan Note (Signed)
Pt takes requip nightly;Plan-will continue

## 2014-09-06 ENCOUNTER — Encounter (HOSPITAL_COMMUNITY): Payer: Self-pay | Admitting: *Deleted

## 2014-09-06 ENCOUNTER — Emergency Department (HOSPITAL_COMMUNITY): Payer: Medicare Other

## 2014-09-06 ENCOUNTER — Emergency Department (HOSPITAL_COMMUNITY)
Admission: EM | Admit: 2014-09-06 | Discharge: 2014-09-06 | Disposition: A | Discharge status: Home / Self Care | Payer: Medicare Other | Attending: Emergency Medicine | Admitting: Emergency Medicine

## 2014-09-06 DIAGNOSIS — R079 Chest pain, unspecified: Secondary | ICD-10-CM

## 2014-09-06 DIAGNOSIS — R011 Cardiac murmur, unspecified: Secondary | ICD-10-CM | POA: Insufficient documentation

## 2014-09-06 DIAGNOSIS — I10 Essential (primary) hypertension: Secondary | ICD-10-CM | POA: Insufficient documentation

## 2014-09-06 DIAGNOSIS — Z79899 Other long term (current) drug therapy: Secondary | ICD-10-CM | POA: Diagnosis not present

## 2014-09-06 DIAGNOSIS — Z8673 Personal history of transient ischemic attack (TIA), and cerebral infarction without residual deficits: Secondary | ICD-10-CM | POA: Insufficient documentation

## 2014-09-06 DIAGNOSIS — G2581 Restless legs syndrome: Secondary | ICD-10-CM | POA: Diagnosis not present

## 2014-09-06 DIAGNOSIS — K5900 Constipation, unspecified: Secondary | ICD-10-CM | POA: Insufficient documentation

## 2014-09-06 DIAGNOSIS — Z880 Allergy status to penicillin: Secondary | ICD-10-CM | POA: Insufficient documentation

## 2014-09-06 DIAGNOSIS — Z8739 Personal history of other diseases of the musculoskeletal system and connective tissue: Secondary | ICD-10-CM | POA: Diagnosis not present

## 2014-09-06 LAB — I-STAT TROPONIN, ED: TROPONIN I, POC: 0.01 ng/mL (ref 0.00–0.08)

## 2014-09-06 LAB — BASIC METABOLIC PANEL
ANION GAP: 10 (ref 5–15)
BUN: 39 mg/dL — ABNORMAL HIGH (ref 6–23)
CHLORIDE: 99 mmol/L (ref 96–112)
CO2: 27 mmol/L (ref 19–32)
Calcium: 8.2 mg/dL — ABNORMAL LOW (ref 8.4–10.5)
Creatinine, Ser: 0.76 mg/dL (ref 0.50–1.10)
GFR calc non Af Amer: 70 mL/min — ABNORMAL LOW (ref >90)
GFR, EST AFRICAN AMERICAN: 81 mL/min — AB (ref >90)
Glucose, Bld: 119 mg/dL — ABNORMAL HIGH (ref 70–99)
Potassium: 4.2 mmol/L (ref 3.5–5.1)
Sodium: 136 mmol/L (ref 135–145)

## 2014-09-06 LAB — CBC
HCT: 30.5 % — ABNORMAL LOW (ref 36.0–46.0)
HEMOGLOBIN: 9.7 g/dL — AB (ref 12.0–15.0)
MCH: 27.6 pg (ref 26.0–34.0)
MCHC: 31.8 g/dL (ref 30.0–36.0)
MCV: 86.9 fL (ref 78.0–100.0)
Platelets: 236 10*3/uL (ref 150–400)
RBC: 3.51 MIL/uL — ABNORMAL LOW (ref 3.87–5.11)
RDW: 13.8 % (ref 11.5–15.5)
WBC: 7.6 10*3/uL (ref 4.0–10.5)

## 2014-09-06 MED ORDER — FENTANYL CITRATE 0.05 MG/ML IJ SOLN: 12.5000 ug | Freq: Once | INTRAMUSCULAR | Status: DC

## 2014-09-06 MED ORDER — NITROGLYCERIN 0.4 MG SL SUBL: 0.4000 mg | SUBLINGUAL_TABLET | SUBLINGUAL | Status: DC | PRN

## 2014-09-06 NOTE — ED Notes (Signed)
Report called to [facility] and given to [facility] LPN

## 2014-09-06 NOTE — Discharge Instructions (Signed)

## 2014-09-06 NOTE — ED Notes (Signed)
Pt from [facility] called EMS after experiencing intermittent chest pain. Denies history of same. Facility gave pt 324 ASA. VSS in transport. Pt is in [facility] for rehab from a pressure ulcer.

## 2014-09-06 NOTE — ED Notes (Signed)
[facility] called to transport pt back to [facility]

## 2014-09-06 NOTE — ED Notes (Signed)
[facility] called for transport.  

## 2014-09-06 NOTE — ED Provider Notes (Signed)
CSN: [id number]     Arrival date & time 09/06/14  2006 History   First MD Initiated Contact with Patient 09/06/14 2030     Chief Complaint  Patient presents with  . Chest Pain    (Consider location/radiation/quality/duration/timing/severity/associated sxs/prior Treatment) Patient is a 81 y.o. female presenting with chest pain. The history is provided by the patient. No language interpreter was used.  Chest Pain Pain location:  L lateral chest Pain quality: pressure   Pain radiates to:  Does not radiate Pain radiates to the back: no   Pain severity:  Moderate Onset quality:  Sudden Timing:  Intermittent Progression:  Resolved Chronicity:  New Context: at rest   Relieved by:  Rest Worsened by:  Nothing tried Ineffective treatments:  None tried Associated symptoms: no abdominal pain, no altered mental status, no anxiety, no claudication, no cough, no diaphoresis, no fatigue, no fever, no headache, no nausea, no palpitations, no shortness of breath, not vomiting and no weakness   Risk factors: hypertension   Risk factors: no aortic disease, no coronary artery disease, no diabetes mellitus, no high cholesterol and no smoking      Past Medical History  Diagnosis Date  . Hypertension   . Restless legs syndrome     on Requip  . Ulcer causing bleeding and hole in wall of stomach or small intestine   . Gastric ulcer     years ago  . Arthritis     Back, knees  . Constipation   . Urinary bladder calculus   . Urinary incontinence   . Bowel incontinence   . Aortic stenosis   . Stroke     Mini, no residual   Past Surgical History  Procedure Laterality Date  . Colon resection  50 years ago  . Tonsillectomy    . Uterine fibroid surgery    . Lumbar laminectomy/decompression microdiscectomy  06/25/2011    Procedure: LUMBAR LAMINECTOMY/DECOMPRESSION MICRODISCECTOMY;  Surgeon: Hosie Spangle, MD;  Location: [facility];  Service: Neurosurgery;  Laterality: Right;  RIGHT Lumbar  Two-Three hemilaminectomy and microdiskectomy  . Vaginal hysterectomy     Family History  Problem Relation Age of Onset  . Anesthesia problems Neg Hx   . Pancreatic cancer Brother   . Dementia Mother   . Stroke Father    History  Substance Use Topics  . Smoking status: Never Smoker   . Smokeless tobacco: Never Used  . Alcohol Use: Yes     Comment: rarely   OB History    No data available     Review of Systems  Constitutional: Negative for fever, diaphoresis and fatigue.  Respiratory: Negative for cough, chest tightness and shortness of breath.   Cardiovascular: Positive for chest pain. Negative for palpitations and claudication.  Gastrointestinal: Negative for nausea, vomiting and abdominal pain.  Neurological: Negative for weakness, light-headedness and headaches.  Psychiatric/Behavioral: Negative for confusion.  All other systems reviewed and are negative.     Allergies  Amoxicillin; Baclofen; Hctz; and Valium  Home Medications   Prior to Admission medications   Medication Sig Start Date End Date Taking? Authorizing Provider  amLODipine (NORVASC) 5 MG tablet Take 5 mg by mouth 2 (two) times daily.    Historical Provider, MD  gabapentin (NEURONTIN) 300 MG capsule Take 1 capsule (300 mg total) by mouth 3 (three) times daily. Reduce your dose to 300 mg 3 x a day. 04/14/14   Venetia Maxon Rama, MD  HYDROmorphone (DILAUDID) 4 MG tablet Take 4 mg  by mouth 3 (three) times daily as needed for severe pain.    Historical Provider, MD  lactose free nutrition (BOOST PLUS) LIQD Take 237 mLs by mouth 2 (two) times daily between meals. 03/21/14   Ripudeep Krystal Eaton, MD  morphine (MSIR) 15 MG tablet Take 15 mg by mouth 2 (two) times daily.    Historical Provider, MD  polyethylene glycol (MIRALAX / GLYCOLAX) packet Take 17 g by mouth as needed for mild constipation.  09/24/13   Historical Provider, MD  rOPINIRole (REQUIP) 2 MG tablet TAKE 1-2 TABLETS BY MOUTH TWICE DAILY AS NEEDED (RESTLESS  LEG). 06/29/14   Lanice Shirts, MD   BP 121/52 mmHg  Pulse 60  Temp(Src) 98.6 F (37 C) (Oral)  Resp 19  SpO2 98% Physical Exam  Constitutional: Vital signs are normal. She appears well-developed and well-nourished. No distress.  Thin, elderly female in NAD  HENT:  Head: Normocephalic and atraumatic.  Nose: Nose normal.  Mouth/Throat: Oropharynx is clear and moist. No oropharyngeal exudate.  Eyes: EOM are normal. Pupils are equal, round, and reactive to light.  Neck: Normal range of motion. Neck supple.  Cardiovascular: Normal rate, regular rhythm and intact distal pulses.   Murmur (systolic ejection) heard. Pulmonary/Chest: Effort normal and breath sounds normal. No respiratory distress. She has no wheezes. She exhibits no tenderness.  No reproducible chest tenderness on palpation.  Lungs clear, normal work of breathing   Abdominal: Soft. There is no tenderness. There is no rebound and no guarding.  Musculoskeletal: Normal range of motion. She exhibits no tenderness.  Lymphadenopathy:    She has no cervical adenopathy.  Neurological: She is alert. No cranial nerve deficit. Coordination normal.  Skin: Skin is warm and dry. She is not diaphoretic.  Psychiatric: She has a normal mood and affect. Her behavior is normal. Judgment and thought content normal.  Nursing note and vitals reviewed.   ED Course  Procedures (including critical care time) Labs Review Labs Reviewed  CBC - Abnormal; Notable for the following:    RBC 3.51 (*)    Hemoglobin 9.7 (*)    HCT 30.5 (*)    All other components within normal limits  BASIC METABOLIC PANEL - Abnormal; Notable for the following:    Glucose, Bld 119 (*)    BUN 39 (*)    Calcium 8.2 (*)    GFR calc non Af Amer 70 (*)    GFR calc Af Amer 81 (*)    All other components within normal limits  I-STAT TROPOININ, ED  Randolm Idol, ED    Imaging Review Dg Chest Port 1 View  09/06/2014   CLINICAL DATA:  Substernal chest pain  radiating to the left for 1 day.  EXAM: PORTABLE CHEST - 1 VIEW  COMPARISON:  Chest and rib radiographs 05/25/2014  FINDINGS: Lung volumes are low. The cardiomediastinal contours are unchanged. Pulmonary vasculature is normal for technique. No consolidation, pleural effusion, or pneumothorax. No acute osseous abnormalities are seen. Degenerative change of the right shoulder.  IMPRESSION: Hypoventilatory chest, no acute process.   Electronically Signed   By: Jeb Levering M.D.   On: 09/06/2014 21:11     EKG Interpretation   Date/Time:  Wednesday September 06 2014 20:24:45 EDT Ventricular Rate:  56 PR Interval:  184 QRS Duration: 94 QT Interval:  454 QTC Calculation: 438 R Axis:   54 Text Interpretation:  Normal sinus rhythm Reconfirmed by GOLDSTON  MD,  SCOTT ([id number]) on 09/06/2014 10:17:13 PM  MDM   Final diagnoses:  Chest pain, unspecified chest pain type    Pt is a 81 yo F with hx of CVA, HTN, Aortic stenosis, arthritis, and chronic constipation who presented with chest pain.  Left sided chest pain on and off a few times tonight while at rest, lasted only a few minutes then resolved.  No associated SOB, nausea, lightheadedness, palpitations.  EMS brought her to the ED from her facility and gave her ASA 325 en route.  Initial EKG with nonspecific mild ST depressions but no reciprocal changes.  Repeat EKG benign .  Benign exam other than + systolic ejection murmur.  No reproducible chest tenderness here.   Hx of aortic stenosis so will hold on NTG.  Given fentanyl for mild continued chest tightness.  No hx of CAD, no tobacco abuse.  High risk HEAR score due to age and hx of stroke.   Will work up with labs, CXR, and EKG.    CXR with no acute pathology.  Initial labs returned benign.    Patient has a reported hx of dementia but is oriented and able to provide significant history, appropriate higher level mental functioning based on this brief exam.  Believe she is able to make  decisions for herself.  She is DNR/DNI and reports that she would not want a heart cath or any other intervention if we were to find anything today.  She verbalizes understanding of the risks of not evaluating further for this chest pain.  Presented today due to the urging of her facility after she voiced some chest pain.  Declines any suggestion of admission and wishes to be discharged home.   Based on the above conversation and the resolution of patient's chest pain, she was appropriate for dc home.  Doubt acute emergent pathology based on benign work up, but can not fully rule out ACS based on a single troponin in a patient with her risk factors.   [facility] called by ED staff to transport patient back to her facility tonight.    Patient was seen with ED Attending, Dr. Brynda Greathouse, MD   Tori Milks, MD 09/06/14 [place], MD 09/07/14 [phone]

## 2014-09-07 ENCOUNTER — Non-Acute Institutional Stay (SKILLED_NURSING_FACILITY): Payer: Medicare Other | Admitting: Internal Medicine

## 2014-09-07 DIAGNOSIS — I10 Essential (primary) hypertension: Secondary | ICD-10-CM | POA: Diagnosis not present

## 2014-09-07 DIAGNOSIS — R0789 Other chest pain: Secondary | ICD-10-CM

## 2014-09-07 DIAGNOSIS — I350 Nonrheumatic aortic (valve) stenosis: Secondary | ICD-10-CM | POA: Diagnosis not present

## 2014-09-07 NOTE — Progress Notes (Signed)
Patient ID: [name], female   DOB: 1919-04-19, 81 y.o.   MRN: [id number] MRN: [id number] Name: Shelly Martin  Sex: female Age: 81 y.o. DOB: 03-14-1919  [place] #: [name] Facility/Room:104 Level Of Care: SNF Provider: Wille Celeste Emergency Contacts: Extended Emergency Contact Information Primary Emergency Contact: [facility] Address: [place], [place] [address] [place] Phone: [phone] Relation: Daughter Secondary Emergency Contact: [place], [place] Mobile Phone: [phone] Relation: Son  Code Status:DNR   Allergies: Amoxicillin; Baclofen; Hctz; and Valium  Chief Complaint  Patient presents with  . Acute Visit   acute visit follow-up chest pain-with ER visit  HPI: Patient is 81 y.o. female who wasw admitted form her assisted living facility for wound care of a stage 4 decubitus ulcer--she also has a history of hypertension as well as aortic stenosis.  Her stay here is been relatively unremarkable however last night she complained of a fairly sudden onset of chest pain--she does have a history of hypertension as well as aortic stenosis. I was called about this and she was sent to the ER secondary to concerns about the acute onset of this which was quite unusual according to patient and nursing  She was evaluated in the ER where it was thought the chest pain was  likely noncardiac-her troponin was negative-blood work was unremarkable as well as a chest x-ray-EKG initially was nonspecific and then apparently within normal limits on recheck.  Her vital signs continued to be stable she returned the facility and has been pain-free since last night.  Currently she is resting comfortably in bed-vital signs continue to be stable  She does have a history of aortic stenosis as well as hypertension.  Blood pressure appears controlled on Norvasc 5 mg a day-I got a manual  reading of 126/62 today-reveals readings 115/56-138/64.  Marland Kitchen  Speaking with her she did not have any radiation of this pain diaphoresis syncope  Past Medical History  Diagnosis Date  . Hypertension   . Restless legs syndrome     on Requip  . Ulcer causing bleeding and hole in wall of stomach or small intestine   . Gastric ulcer     years ago  . Arthritis     Back, knees  . Constipation   . Urinary bladder calculus   . Urinary incontinence   . Bowel incontinence   . Aortic stenosis   . Stroke     Mini, no residual    Past Surgical History  Procedure Laterality Date  . Colon resection  50 years ago  . Tonsillectomy    . Uterine fibroid surgery    . Lumbar laminectomy/decompression microdiscectomy  06/25/2011    Procedure: LUMBAR LAMINECTOMY/DECOMPRESSION MICRODISCECTOMY;  Surgeon: Hosie Spangle, MD;  Location: [facility];  Service: Neurosurgery;  Laterality: Right;  RIGHT Lumbar Two-Three hemilaminectomy and microdiskectomy  . Vaginal hysterectomy        Medication List       This list is accurate as of: 09/07/14  1:53 PM.  Always use your most recent med list.               amLODipine 5 MG tablet  Commonly known as:  NORVASC  Take 5 mg by mouth 2 (two) times daily.     docusate sodium 100 MG capsule  Commonly known as:  COLACE  Take 100 mg by mouth 2 (two) times daily.     fesoterodine 8 MG Tb24 tablet  Commonly known as:  TOVIAZ  Take 8 mg by mouth daily.     gabapentin 300 MG capsule  Commonly known as:  NEURONTIN  Take 1 capsule (300 mg total) by mouth 3 (three) times daily. Reduce your dose to 300 mg 3 x a day.     HYDROmorphone 4 MG tablet  Commonly known as:  DILAUDID  Take 4 mg by mouth 2 (two) times daily as needed for moderate pain or severe pain.     JUVEN Powd  Take 4 oz by mouth 2 (two) times daily.     ENSURE PO  Take 237 mLs by mouth 2 (two) times daily.     lactose free nutrition Liqd  Take 237 mLs by mouth 2 (two) times daily  between meals.     morphine 15 MG tablet  Commonly known as:  MSIR  Take 15 mg by mouth every 8 (eight) hours.     polyethylene glycol packet  Commonly known as:  MIRALAX / GLYCOLAX  Take 17 g by mouth as needed for mild constipation.     rOPINIRole 2 MG tablet  Commonly known as:  REQUIP  Take 2 mg by mouth 2 (two) times daily.     rOPINIRole 2 MG tablet  Commonly known as:  REQUIP  TAKE 1-2 TABLETS BY MOUTH TWICE DAILY AS NEEDED (RESTLESS LEG).     sennosides-docusate sodium 8.6-50 MG tablet  Commonly known as:  SENOKOT-S  Take 2 tablets by mouth at bedtime.        No orders of the defined types were placed in this encounter.    Immunization History  Administered Date(s) Administered  . Influenza Split 04/15/2012  . Influenza,inj,Quad PF,36+ Mos 03/06/2014  . Pneumococcal Polysaccharide-23 08/29/2013    History  Substance Use Topics  . Smoking status: Never Smoker   . Smokeless tobacco: Never Used  . Alcohol Use: Yes     Comment: rarely    Family history is noncontributory    Review of Systems  DATA OBTAINED: from patient, nurse, medical record,  GENERAL:  no fevers, fatigue, appetite changes--says her appetite is getting better apparently likes the food choices a bit better than when she first arrived SKIN: pain with decubitus ulcer EYES: No eye pain, redness, discharge EARS: No earache, tinnitus, change in hearing NOSE: No congestion, drainage or bleeding  MOUTH/THROAT: No mouth or tooth pain, No sore throat RESPIRATORY: No cough, wheezing, SOB CARDIAC: No chest pain, palpitations, lower extremity edema  GI: No abdominal pain, No N/V/D or constipation, No heartburn or reflux  GU: No dysuria, frequency or urgency, or incontinence  MUSCULOSKELETAL: No unrelieved bone/joint pain NEUROLOGIC: No headache, dizziness or focal weakness PSYCHIATRIC: No overt anxiety or sadness, No behavior issue.   There were no vitals filed for this visit.  Physical  Exam  She is afebrile pulse of 68 respirations 18 blood pressure taken manually 126/62  GENERAL APPEARANCE: Alert, conversant,  No acute distress sting comfortably in bed.  SKIN: No diaphoresis rash--sacral wound is currently covered this is followed closely by wound care HEAD: Normocephalic, atraumatic  EYES: Conjunctiva/lids clear. Pupils round, reactive. EOMs intact.  EARS: External exam WNL, canals clear. Hearing grossly normal.  NOSE: No deformity or discharge.  MOUTH/THROAT: Lips w/o lesions or pharynx is clear mucous membranes moist  RESPIRATORY: Breathing is even, unlabored. Lung sounds are clear   CARDIOVASCULAR: Heart  RRR  4/6 murmur,no  rubs or gallops. No peripheral edema.--compression hose applied   GASTROINTESTINAL: Abdomen is soft, non-tender, not distended w/ normal bowel sounds. GENITOURINARY: Bladder non tender, not distended  MUSCULOSKELETAL: No abnormal joints or musculature---moves  all x 4 NEUROLOGIC:  Cranial nerves 2-12 grossly intact. Moves all extremities  PSYCHIATRIC: Mood and affect appropriate to situation, no behavioral issues  Patient Active Problem List   Diagnosis Date Noted  . Decubitus ulcer of sacral area 04/15/2014  . Moderate aortic stenosis 04/14/2014  . Pre-syncope 04/14/2014  . Hypotension 04/14/2014  . Acute on chronic renal failure 04/14/2014  . Anemia, iron deficiency 04/14/2014  . Protein-calorie malnutrition, severe 04/14/2014  . Altered mental status   . Dehydration 04/13/2014  . Altered mental state 04/13/2014  . Acute kidney injury   . Hypokalemia 03/25/2014  . Syncope 03/16/2014  . Acute encephalopathy 03/16/2014  . Leg swelling 03/02/2014  . Avascular necrosis of bone of right hip 09/27/2013  . Primary osteoarthritis of right hip 09/07/2013  . Spinal stenosis, lumbar region, with neurogenic claudication 09/07/2013  . Closed fracture of pubic ramus 09/07/2013  . BPPV (benign paroxysmal positional vertigo) 09/07/2013  .  Pharyngeal swelling 08/30/2013  . Pelvic fracture 08/28/2013  . Fall 08/26/2013  . Dizziness 08/26/2013  . Abnormal stress test 08/26/2013  . Preop cardiovascular exam 08/01/2013  . Aortic stenosis 08/01/2013  . Edema 06/30/2013  . Loss of weight 01/04/2013  . Situational stress 01/04/2013  . Chronic right hip pain 07/05/2012  . Knee pain, acute 06/29/2012  . Vertigo 06/24/2012  . Acute exacerbation of chronic low back pain 06/24/2012  . Lumbar pain with radiation down right leg 06/09/2012  . Osteoarthritis of hip 04/29/2012  . History of TIAs 04/17/2012  . S/P total hysterectomy and bilateral salpingo-oophorectomy 04/17/2012  . Hard of hearing 04/17/2012  . History of anemia 04/17/2012  . RLS (restless legs syndrome) 04/17/2012  . Chronic back pain 04/17/2012  . Spinal stenosis, lumbar 04/17/2012  . Lower back pain 12/20/2011  . Personal history of colonic polyps 10/08/2011  . H/O: CVA (cardiovascular accident) 09/14/2011  . Hypertension 09/14/2011  . Restless legs syndrome 09/14/2011  . Constipation 08/20/2011  . Urinary incontinence 08/20/2011    CBC    Component Value Date/Time   WBC 7.6 09/06/2014 2058   RBC 3.51* 09/06/2014 2058   RBC 3.42* 06/28/2014 1205   HGB 9.7* 09/06/2014 2058   HCT 30.5* 09/06/2014 2058   PLT 236 09/06/2014 2058   MCV 86.9 09/06/2014 2058   LYMPHSABS 1.1 06/28/2014 1205   MONOABS 0.4 06/28/2014 1205   EOSABS 0.0 06/28/2014 1205   BASOSABS 0.0 06/28/2014 1205    CMP     Component Value Date/Time   NA 136 09/06/2014 2142   K 4.2 09/06/2014 2142   CL 99 09/06/2014 2142   CO2 27 09/06/2014 2142   GLUCOSE 119* 09/06/2014 2142   BUN 39* 09/06/2014 2142   CREATININE 0.76 09/06/2014 2142   CREATININE 0.78 06/28/2014 1205   CALCIUM 8.2* 09/06/2014 2142   PROT 5.4* 06/28/2014 1205   ALBUMIN 3.0* 06/28/2014 1205   AST 14 06/28/2014 1205   ALT <8 06/28/2014 1205   ALKPHOS 94 06/28/2014 1205   BILITOT 0.5 06/28/2014 1205   GFRNONAA  70* 09/06/2014 2142   GFRAA 81* 09/06/2014 2142    Assessment and Plan   #1 chest pain-workup in ER appear to be largely negative there's been no reoccurrence at this point continue to monitor-.  #2-hypertension  this appears to be quite well controlled on low dose Norvasc.  #3 aortic stenosis does continue to  have a significant murmur but medically appears to be quite stable --she is not on anticoagulation I do note per chart review she does have a history of a gastric ulcer with  bleeding apparently in the past  #4-history of sacral ulcer-again this is followed by wound care she does receive morphine routinely as well as Dilaudid as needed for pain --at this point appears to be effective.  [id number]    No problem-specific assessment & plan notes found for this encounter.   LASSEN, ARLO C,

## 2014-09-14 ENCOUNTER — Other Ambulatory Visit: Payer: Self-pay | Admitting: *Deleted

## 2014-09-14 MED ORDER — MORPHINE SULFATE 15 MG PO TABS: 15.0000 mg | ORAL_TABLET | Freq: Three times a day (TID) | ORAL | Status: DC

## 2014-09-14 NOTE — Telephone Encounter (Signed)
[facility] Fax needs to be faxed to pharmacy [phone]

## 2014-10-31 ENCOUNTER — Encounter: Payer: Self-pay | Admitting: Internal Medicine

## 2014-10-31 ENCOUNTER — Non-Acute Institutional Stay (SKILLED_NURSING_FACILITY): Payer: Medicare Other | Admitting: Internal Medicine

## 2014-10-31 DIAGNOSIS — I10 Essential (primary) hypertension: Secondary | ICD-10-CM | POA: Diagnosis not present

## 2014-10-31 DIAGNOSIS — M25569 Pain in unspecified knee: Secondary | ICD-10-CM

## 2014-10-31 DIAGNOSIS — F419 Anxiety disorder, unspecified: Secondary | ICD-10-CM

## 2014-10-31 DIAGNOSIS — G2581 Restless legs syndrome: Secondary | ICD-10-CM

## 2014-10-31 NOTE — Progress Notes (Signed)
Patient ID: Shelly Martin, female   DOB: 1918/06/24, 81 y.o.   MRN: [id number] MRN: [id number] Name: Shelly Martin  Sex: female Age: 81 y.o. DOB: April 08, 1919  [place] #: [name] Facility/Room:104 Level Of Care: SNF Provider: Wille Celeste Emergency Contacts: Extended Emergency Contact Information Primary Emergency Contact: [facility] Address: [place], [place] [address] [place] Phone: [phone] Relation: Daughter Secondary Emergency Contact: [place], [place] of [place] Mobile Phone: [phone] Relation: Son  Code Status:DNR   Allergies: Amoxicillin; Baclofen; Hctz; and Valium  Chief complaint-acute visit secondary to right leg discomfort-  HPI: Patient is 81 y.o. female who is admitted form her assisted living facility for wound care of a stage 4 decubitus ulcer. Per nursing this is stable.  Her complaint today is some increased right leg pain she describes this as a spasm restless leg-type pain-she says she has  had this chronically in her left leg but now appears to be having it in her right leg as well.  She denies any recent fall that would have injured her leg she did have some rib fractures from an earlier fall.  She continues to ambulatewith her walker.    Past Medical History  Diagnosis Date  . Hypertension   . Restless legs syndrome     on Requip  . Ulcer causing bleeding and hole in wall of stomach or small intestine   . Gastric ulcer     years ago  . Arthritis     Back, knees  . Constipation   . Urinary bladder calculus   . Urinary incontinence   . Bowel incontinence   . Aortic stenosis   . Stroke     Mini, no residual    Past Surgical History  Procedure Laterality Date  . Colon resection  50 years ago  . Tonsillectomy    . Uterine fibroid surgery    . Lumbar laminectomy/decompression microdiscectomy  06/25/2011    Procedure: LUMBAR  LAMINECTOMY/DECOMPRESSION MICRODISCECTOMY;  Surgeon: Hosie Spangle, MD;  Location: [facility];  Service: Neurosurgery;  Laterality: Right;  RIGHT Lumbar Two-Three hemilaminectomy and microdiskectomy  . Vaginal hysterectomy        Medication List       This list is accurate as of: 10/31/14  4:09 PM.  Always use your most recent med list.               amLODipine 5 MG tablet  Commonly known as:  NORVASC  Take 5 mg by mouth 2 (two) times daily. For HTN     docusate sodium 100 MG capsule  Commonly known as:  COLACE  Take 100 mg by mouth 2 (two) times daily.     fesoterodine 8 MG Tb24 tablet  Commonly known as:  TOVIAZ  Take 8 mg by mouth daily.     gabapentin 300 MG capsule  Commonly known as:  NEURONTIN  Take 1 capsule (300 mg total) by mouth 3 (three) times daily. Reduce your dose to 300 mg 3 x a day.     HYDROmorphone 4 MG tablet  Commonly known as:  DILAUDID  Take 4 mg by mouth 2 (two) times daily as needed for moderate pain or severe pain.     JUVEN Powd  Take 4 oz by mouth 2 (two) times daily.     ENSURE PO  Take 237  mLs by mouth 2 (two) times daily.     lactose free nutrition Liqd  Take 237 mLs by mouth 2 (two) times daily between meals.     morphine 15 MG tablet  Commonly known as:  MSIR  Take 1 tablet (15 mg total) by mouth every 8 (eight) hours.     polyethylene glycol packet  Commonly known as:  MIRALAX / GLYCOLAX  Take 17 g by mouth as needed for mild constipation.     rOPINIRole 2 MG tablet  Commonly known as:  REQUIP  Take 4 mg by mouth at bedtime.     rOPINIRole 2 MG tablet  Commonly known as:  REQUIP  TAKE 1-2 TABLETS BY MOUTH TWICE DAILY AS NEEDED (RESTLESS LEG).     sennosides-docusate sodium 8.6-50 MG tablet  Commonly known as:  SENOKOT-S  Take 2 tablets by mouth at bedtime.        No orders of the defined types were placed in this encounter.    Immunization History  Administered Date(s) Administered  . Influenza Split  04/15/2012  . Influenza,inj,Quad PF,36+ Mos 03/06/2014  . PPD Test 08/30/2014  . Pneumococcal Polysaccharide-23 08/29/2013    History  Substance Use Topics  . Smoking status: Never Smoker   . Smokeless tobacco: Never Used  . Alcohol Use: Yes     Comment: rarely    Family history is noncontributory    Review of Systems  DATA OBTAINED: from patient, nurse GENERAL:  no fevers, fatigue, appetite changes SKIN: History of decubitus ulcer as noted above EYES: No eye pain, redness, discharge EARS: No earache, tinnitus, change in hearing NOSE: No congestion, drainage or bleeding  MOUTH/THROAT: No mouth or tooth pain, No sore throat RESPIRATORY: No cough, wheezing, SOB CARDIAC: No chest pain, palpitations has chronic mild pedal edema, GI: No abdominal pain, No N/V/D or constipation, No heartburn or reflux  GU: No dysuria,  MUSCULOSKELETAL: Has history of chronic pain especially of her legs-on morphine 15 mg every 8 hours as well as Dilaudid 4 mg twice a day when necessary Complaining spasm type pain in her right leg has a history of this as well in her left leg  NEUROLOGIC: No headache, dizziness or focal weakness PSYCHIATRIC: No overt anxiety or sadness, No behavior issue.   Filed Vitals:   10/31/14 1559  BP: 148/66  Pulse: 90  Resp: 18    Physical Exam  GENERAL APPEARANCE: Alert, conversant,  No acute distress--normally frail.  SKIN: No diaphoresis rash HEAD: Normocephalic, atraumatic  EYES: Conjunctiva/lids clear. Pupils round, reactive. EOMs intact.  EARS: External exam WNL, canals clear. Hearing grossly normal.  NOSE: No deformity or discharge.  MOUTH/THROAT: Lips w/o lesions  RESPIRATORY: Breathing is even, unlabored. Lung sounds are clear  and what shallow air entry  CARDIOVASCULAR: Heart RRR  4/6 murmur,no  rubs or gallops. mild peripheral edema mainly her feet bilaterally pedal pulses are intact bilaterally .   GASTROINTESTINAL: Abdomen is soft, non-tender, not  distended w/ normal bowel sounds. MUSCULOSKELETAL: No abnormal joints or musculature--there was some pain with flexion and extension at her right hip and upper leg although she does not really describe this as new pain-I did not note any deformity other than arthritic changes of either leg She is able to stand without assistance and use her walker it appears without acute pain  NEUROLOGIC:  Cranial nerves 2-12 grossly intact. Moves all extremities  PSYCHIATRIC: Mood and affect appropriate to situation, no behavioral issues somewhat anxious   Patient Active  Problem List   Diagnosis Date Noted  . Pain in joint, lower leg 10/31/2014  . Decubitus ulcer of sacral area 04/15/2014  . Moderate aortic stenosis 04/14/2014  . Pre-syncope 04/14/2014  . Hypotension 04/14/2014  . Acute on chronic renal failure 04/14/2014  . Anemia, iron deficiency 04/14/2014  . Protein-calorie malnutrition, severe 04/14/2014  . Altered mental status   . Dehydration 04/13/2014  . Altered mental state 04/13/2014  . Acute kidney injury   . Hypokalemia 03/25/2014  . Syncope 03/16/2014  . Acute encephalopathy 03/16/2014  . Leg swelling 03/02/2014  . Avascular necrosis of bone of right hip 09/27/2013  . Primary osteoarthritis of right hip 09/07/2013  . Spinal stenosis, lumbar region, with neurogenic claudication 09/07/2013  . Closed fracture of pubic ramus 09/07/2013  . BPPV (benign paroxysmal positional vertigo) 09/07/2013  . Pharyngeal swelling 08/30/2013  . Pelvic fracture 08/28/2013  . Fall 08/26/2013  . Dizziness 08/26/2013  . Abnormal stress test 08/26/2013  . Preop cardiovascular exam 08/01/2013  . Aortic stenosis 08/01/2013  . Edema 06/30/2013  . Loss of weight 01/04/2013  . Situational stress 01/04/2013  . Chronic right hip pain 07/05/2012  . Knee pain, acute 06/29/2012  . Vertigo 06/24/2012  . Acute exacerbation of chronic low back pain 06/24/2012  . Lumbar pain with radiation down right leg  06/09/2012  . Osteoarthritis of hip 04/29/2012  . History of TIAs 04/17/2012  . S/P total hysterectomy and bilateral salpingo-oophorectomy 04/17/2012  . Hard of hearing 04/17/2012  . History of anemia 04/17/2012  . RLS (restless legs syndrome) 04/17/2012  . Chronic back pain 04/17/2012  . Spinal stenosis, lumbar 04/17/2012  . Lower back pain 12/20/2011  . Personal history of colonic polyps 10/08/2011  . H/O: CVA (cardiovascular accident) 09/14/2011  . Hypertension 09/14/2011  . Restless legs syndrome 09/14/2011  . Constipation 08/20/2011  . Urinary incontinence 08/20/2011    CBC    Component Value Date/Time   WBC 7.6 09/06/2014 2058   RBC 3.51* 09/06/2014 2058   RBC 3.42* 06/28/2014 1205   HGB 9.7* 09/06/2014 2058   HCT 30.5* 09/06/2014 2058   PLT 236 09/06/2014 2058   MCV 86.9 09/06/2014 2058   LYMPHSABS 1.1 06/28/2014 1205   MONOABS 0.4 06/28/2014 1205   EOSABS 0.0 06/28/2014 1205   BASOSABS 0.0 06/28/2014 1205    CMP     Component Value Date/Time   NA 136 09/06/2014 2142   K 4.2 09/06/2014 2142   CL 99 09/06/2014 2142   CO2 27 09/06/2014 2142   GLUCOSE 119* 09/06/2014 2142   BUN 39* 09/06/2014 2142   CREATININE 0.76 09/06/2014 2142   CREATININE 0.78 06/28/2014 1205   CALCIUM 8.2* 09/06/2014 2142   PROT 5.4* 06/28/2014 1205   ALBUMIN 3.0* 06/28/2014 1205   AST 14 06/28/2014 1205   ALT <8 06/28/2014 1205   ALKPHOS 94 06/28/2014 1205   BILITOT 0.5 06/28/2014 1205   GFRNONAA 70* 09/06/2014 2142   GFRAA 81* 09/06/2014 2142    Assessment and Plan  #1-leg pain-she does have an extensive history of restless legs-she is on Requip at night-she is also on extensive pain medication with Dilaudid 4 mg twice a day as needed-as well as morphine 15 mg every 8 hours-- would be very hesitant to increase this secondary to general frailty her advanced age.  This was discussed with patient-at this point will recheck her electrolytes lab work-also check an x-ray of her right  leg and hip to rule out any bony process.  We will await these findings-and patient has expressed agreement with this.  #2 hypertension apparently when she first came this was an issue but blood pressures appear to be stabilized somewhat elevated today at 148/66 although she is somewhat anxious which I think may be contributing to this previous blood pressures 119/76-109/60-at this point will monitor  For-- anxiety will order a psychiatric consult as well for follow-up  [id number]    LASSEN, ARLO C,

## 2014-12-07 ENCOUNTER — Non-Acute Institutional Stay (SKILLED_NURSING_FACILITY): Payer: Medicare Other | Admitting: Internal Medicine

## 2014-12-07 ENCOUNTER — Encounter: Payer: Self-pay | Admitting: Internal Medicine

## 2014-12-07 DIAGNOSIS — M25569 Pain in unspecified knee: Secondary | ICD-10-CM

## 2014-12-07 DIAGNOSIS — I10 Essential (primary) hypertension: Secondary | ICD-10-CM

## 2014-12-07 DIAGNOSIS — M159 Polyosteoarthritis, unspecified: Secondary | ICD-10-CM | POA: Diagnosis not present

## 2014-12-07 DIAGNOSIS — R609 Edema, unspecified: Secondary | ICD-10-CM

## 2014-12-08 ENCOUNTER — Other Ambulatory Visit: Payer: Self-pay | Admitting: Internal Medicine

## 2014-12-08 ENCOUNTER — Ambulatory Visit (HOSPITAL_COMMUNITY)
Admission: RE | Admit: 2014-12-08 | Discharge: 2014-12-08 | Disposition: A | Discharge status: Home / Self Care | Payer: Medicare Other | Source: Ambulatory Visit | Attending: Internal Medicine | Admitting: Internal Medicine

## 2014-12-08 DIAGNOSIS — S8991XA Unspecified injury of right lower leg, initial encounter: Secondary | ICD-10-CM

## 2014-12-08 DIAGNOSIS — M179 Osteoarthritis of knee, unspecified: Secondary | ICD-10-CM | POA: Diagnosis not present

## 2014-12-08 DIAGNOSIS — W010XXA Fall on same level from slipping, tripping and stumbling without subsequent striking against object, initial encounter: Secondary | ICD-10-CM | POA: Diagnosis not present

## 2014-12-08 DIAGNOSIS — M8580 Other specified disorders of bone density and structure, unspecified site: Secondary | ICD-10-CM | POA: Insufficient documentation

## 2014-12-10 NOTE — Progress Notes (Signed)
Patient ID: Shelly Martin, female   DOB: 1919-03-01, 81 y.o.   MRN: [id number] MRN: [id number] Name: Shelly Martin  Sex: female Age: 81 y.o. DOB: 05-25-1919  [place] [facility]: [name] Facility/Room:104 Level Of Care: SNF Provider: Wille Celeste Emergency Contacts: Extended Emergency Contact Information Primary Emergency Contact: [facility] Address: [place], [place] [address] [place] Phone: [phone] Relation: Daughter Secondary Emergency Contact: [place], [place] Mobile Phone: [phone] Relation: Son  Code Status:DNR   Allergies: Amoxicillin; Baclofen; Hctz; and Valium  Chief Complaint  Patient presents with  . Medical Management of Chronic Issues    HPI: Patient is 81 y.o. female who is admitted form her assisted living facility for wound care of a stage 4 decubitus ulcer.--This is followed by wound care and apparently is stable.  She also has a history of restless legs in continues on Requip.  She has a history of hypertension recently she was all for Norvasc but blood pressure was significantly elevated she is now on Norvasc 5 mg twice a day and this appears to be stable recent blood pressures 130/70-133/67-122/64.  Patient did complain of some right leg pain recently an x-ray was ordered which was with somewhat vague saying they could not rule out the possibility of a right tibial plateau fracture-we will need to do a fixed side x-ray to follow-up on this actually her pain appears to be controlled and a bit better.  Nursing staff also think she's had some increased lower extremity foot edema I did discuss this with nursing staff appears she's gained about 7 pounds the past 2 months this has been gradual about 3 pounds in the past month although this is been stable here for the past week or so-she does not complain of any increased shortness of breath or cough.  Her chart  review I see she did have an echo done in October 2015 which showed an ejection fraction of 65% with some diastolic dysfunction.    Past Medical History  Diagnosis Date  . Hypertension   . Restless legs syndrome     on Requip  . Ulcer causing bleeding and hole in wall of stomach or small intestine   . Gastric ulcer     years ago  . Arthritis     Back, knees  . Constipation   . Urinary bladder calculus   . Urinary incontinence   . Bowel incontinence   . Aortic stenosis   . Stroke     Mini, no residual    Past Surgical History  Procedure Laterality Date  . Colon resection  50 years ago  . Tonsillectomy    . Uterine fibroid surgery    . Lumbar laminectomy/decompression microdiscectomy  06/25/2011    Procedure: LUMBAR LAMINECTOMY/DECOMPRESSION MICRODISCECTOMY;  Surgeon: Hosie Spangle, MD;  Location: [facility];  Service: Neurosurgery;  Laterality: Right;  RIGHT Lumbar Two-Three hemilaminectomy and microdiskectomy  . Vaginal hysterectomy        Medication List       This list is accurate as of: 12/07/14 11:59 PM.  Always use your most recent med list.               amLODipine 5 MG tablet  Commonly known as:  NORVASC  Take 5 mg by mouth 2 (two) times daily. For HTN     docusate  sodium 100 MG capsule  Commonly known as:  COLACE  Take 100 mg by mouth 2 (two) times daily.     fesoterodine 8 MG Tb24 tablet  Commonly known as:  TOVIAZ  Take 8 mg by mouth daily.     gabapentin 300 MG capsule  Commonly known as:  NEURONTIN  Take 1 capsule (300 mg total) by mouth 3 (three) times daily. Reduce your dose to 300 mg 3 x a day.     HYDROmorphone 4 MG tablet  Commonly known as:  DILAUDID  Take 2 mg by mouth 2 (two) times daily as needed for moderate pain or severe pain.     JUVEN Powd  Take 4 oz by mouth 2 (two) times daily.     ENSURE PO  Take 237 mLs by mouth 2 (two) times daily.     lactose free nutrition Liqd  Take 237 mLs by mouth 2 (two) times daily between  meals.     morphine 15 MG tablet  Commonly known as:  MSIR  Take 1 tablet (15 mg total) by mouth every 8 (eight) hours.     polyethylene glycol packet  Commonly known as:  MIRALAX / GLYCOLAX  Take 17 g by mouth as needed for mild constipation.     rOPINIRole 2 MG tablet  Commonly known as:  REQUIP  Take 4 mg by mouth at bedtime.     rOPINIRole 2 MG tablet  Commonly known as:  REQUIP  TAKE 1-2 TABLETS BY MOUTH TWICE DAILY AS NEEDED (RESTLESS LEG).     sennosides-docusate sodium 8.6-50 MG tablet  Commonly known as:  SENOKOT-S  Take 2 tablets by mouth at bedtime.        No orders of the defined types were placed in this encounter.    Immunization History  Administered Date(s) Administered  . Influenza Split 04/15/2012  . Influenza,inj,Quad PF,36+ Mos 03/06/2014  . PPD Test 08/30/2014  . Pneumococcal Polysaccharide-23 08/29/2013    History  Substance Use Topics  . Smoking status: Never Smoker   . Smokeless tobacco: Never Used  . Alcohol Use: Yes     Comment: rarely    Family history is noncontributory    Review of Systems  DATA OBTAINED: from patient, nurse, medical record,  GENERAL:  no fevers, fatigue, appetite changes SKIN: pain with decubitus ulcer followed by wound care and apparently stable EYES: No eye pain, redness, discharge EARS: No earache, tinnitus, change in hearing NOSE: No congestion, drainage or bleeding  MOUTH/THROAT: No mouth or tooth pain, No sore throat RESPIRATORY: No cough, wheezing, SOB CARDIAC: No chest pain, palpitations, lower extremity edema noted in the feet bilaterally  GI: No abdominal pain, No N/V/D or constipation, No heartburn or reflux  GU: No dysuria, frequency or urgency, or incontinence  MUSCULOSKELETAL: No unrelieved bone/joint pain NEUROLOGIC: No headache, dizziness or focal weakness PSYCHIATRIC: No overt anxiety or sadness, No behavior issue.   Filed Vitals:   12/07/14 1543  BP: 130/70  Pulse: 86  Resp: 20   is  afebrile  Physical Exam  GENERAL APPEARANCE: Alert, conversant,  No acute distress.  SKIN: No diaphoresis rash decubitus ulcer sacral followed by wound care HEAD: Normocephalic, atraumatic  EYES: Conjunctiva/lids clear. Pupils round, reactive. EOMs intact.  EARS: External exam WNL, canals clear. Hearing grossly normal.  NOSE: No deformity or discharge.  MOUTH/THROAT: Lips w/o lesions  RESPIRATORY: Breathing is even, unlabored. Lung sounds are clear   CARDIOVASCULAR: Heart RRR  4/6 murmur,no  rubs or  gallops. Appears to have some increased pedal edema bilaterally I would say one plus this is cool to touch non-erythematous.   GASTROINTESTINAL: Abdomen is soft, non-tender, protuberant w/ normal bowel sounds. GENITOURINARY: Bladder non tender, not distended  MUSCULOSKELETAL: No abnormal joints or musculature other than arthritic changes moves all extremities 4 she does ambulate with a walker NEUROLOGIC:  Cranial nerves 2-12 grossly intact. Moves all extremities  PSYCHIATRIC: Mood and affect appropriate to situation, no behavioral issues  Patient Active Problem List   Diagnosis Date Noted  . Pain in joint, lower leg 10/31/2014  . Decubitus ulcer of sacral area 04/15/2014  . Moderate aortic stenosis 04/14/2014  . Pre-syncope 04/14/2014  . Hypotension 04/14/2014  . Acute on chronic renal failure 04/14/2014  . Anemia, iron deficiency 04/14/2014  . Protein-calorie malnutrition, severe 04/14/2014  . Altered mental status   . Dehydration 04/13/2014  . Altered mental state 04/13/2014  . Acute kidney injury   . Hypokalemia 03/25/2014  . Syncope 03/16/2014  . Acute encephalopathy 03/16/2014  . Leg swelling 03/02/2014  . Avascular necrosis of bone of right hip 09/27/2013  . Primary osteoarthritis of right hip 09/07/2013  . Spinal stenosis, lumbar region, with neurogenic claudication 09/07/2013  . Closed fracture of pubic ramus 09/07/2013  . BPPV (benign paroxysmal positional vertigo)  09/07/2013  . Pharyngeal swelling 08/30/2013  . Pelvic fracture 08/28/2013  . Fall 08/26/2013  . Dizziness 08/26/2013  . Abnormal stress test 08/26/2013  . Preop cardiovascular exam 08/01/2013  . Aortic stenosis 08/01/2013  . Edema 06/30/2013  . Loss of weight 01/04/2013  . Situational stress 01/04/2013  . Chronic right hip pain 07/05/2012  . Knee pain, acute 06/29/2012  . Vertigo 06/24/2012  . Acute exacerbation of chronic low back pain 06/24/2012  . Lumbar pain with radiation down right leg 06/09/2012  . Osteoarthritis of hip 04/29/2012  . History of TIAs 04/17/2012  . S/P total hysterectomy and bilateral salpingo-oophorectomy 04/17/2012  . Hard of hearing 04/17/2012  . History of anemia 04/17/2012  . RLS (restless legs syndrome) 04/17/2012  . Chronic back pain 04/17/2012  . Spinal stenosis, lumbar 04/17/2012  . Lower back pain 12/20/2011  . Personal history of colonic polyps 10/08/2011  . H/O: CVA (cardiovascular accident) 09/14/2011  . Hypertension 09/14/2011  . Restless legs syndrome 09/14/2011  . Constipation 08/20/2011  . Urinary incontinence 08/20/2011   Labs.  11/01/2014.  WBC 6.4 hemoglobin 10.4 platelets 282.  Sodium 136 potassium 3.8 BUN 23 creatinine 0.8.  Albumin 3.4 otherwise liver function tests within normal limits.  TSH-2.90 CBC    Component Value Date/Time   WBC 7.6 09/06/2014 2058   RBC 3.51* 09/06/2014 2058   RBC 3.42* 06/28/2014 1205   HGB 9.7* 09/06/2014 2058   HCT 30.5* 09/06/2014 2058   PLT 236 09/06/2014 2058   MCV 86.9 09/06/2014 2058   LYMPHSABS 1.1 06/28/2014 1205   MONOABS 0.4 06/28/2014 1205   EOSABS 0.0 06/28/2014 1205   BASOSABS 0.0 06/28/2014 1205    CMP     Component Value Date/Time   NA 136 09/06/2014 2142   K 4.2 09/06/2014 2142   CL 99 09/06/2014 2142   CO2 27 09/06/2014 2142   GLUCOSE 119* 09/06/2014 2142   BUN 39* 09/06/2014 2142   CREATININE 0.76 09/06/2014 2142   CREATININE 0.78 06/28/2014 1205   CALCIUM  8.2* 09/06/2014 2142   PROT 5.4* 06/28/2014 1205   ALBUMIN 3.0* 06/28/2014 1205   AST 14 06/28/2014 1205   ALT <8 06/28/2014 1205  ALKPHOS 94 06/28/2014 1205   BILITOT 0.5 06/28/2014 1205   GFRNONAA 70* 09/06/2014 2142   GFRAA 81* 09/06/2014 2142    Assessment and Plan Pedal edema-apparently this has increased somewhat-she has gained some weight although some of this could be appetite related-we will cautiously start Lasix 20 mg a day for 3 days with low-dose potassium for 3 days-her blood pressure will have to be monitored however to avoid any risk of hypotension-I note she does have a hydrochlorothiazide allergy although patient cannot really tell me why-nursing staff will talk to daughter about this as well  Also will update a metabolic panel-as well as a BNP tomorrow   #2 hypertension this appears to be stable she is back on Norvasc again we will have to watch this.  #3 she is on Remeron low-dose I suspect for possible weight loss this has reversed itself at this point continue to monitor.  #4-history of chronic pain with  osteoarthritis-spinal stenosis-- she is on morphine routinely as well as Dilaudid when necessary this appears to be relatively stable.  #5-history of decubitus ulcer sacral this is followed by wound care as noted above.  #6-history restless legs she is on Requip she does not complain of issues today.  #7-history right leg pain and this actually appears to be improved she did have a recent x-ray which could not totally rule out a right tibial plateau fracture-again we have ordered repeat x-ray here for follow-up clinically she appears to be stable in this regard.  #8 anemia this appears stable most recent hemoglobin 10.4 and 11/01/2014 will update a CBC as well.  [id number] note greater than 40 minutes spent assessing patient reviewing her chart and coordinating and formulating a plan of care for numerous diagnoses-of note greater than 50% of time spent  coordinating plan of care again with  chart review as well as discussion with staff about weight trends and medical management   Marykathryn Carboni C,

## 2014-12-28 ENCOUNTER — Non-Acute Institutional Stay (SKILLED_NURSING_FACILITY): Payer: Medicare Other | Admitting: Internal Medicine

## 2014-12-28 ENCOUNTER — Encounter: Payer: Self-pay | Admitting: Internal Medicine

## 2014-12-28 DIAGNOSIS — I5032 Chronic diastolic (congestive) heart failure: Secondary | ICD-10-CM | POA: Diagnosis not present

## 2014-12-28 DIAGNOSIS — M7989 Other specified soft tissue disorders: Secondary | ICD-10-CM

## 2014-12-28 DIAGNOSIS — R600 Localized edema: Secondary | ICD-10-CM

## 2014-12-28 NOTE — Progress Notes (Signed)
Patient ID: Shelly Martin, female   DOB: [birth year], 81 y.o.   MRN: [id number]  MRN: [id number] Name: Shelly Martin  Sex: female Age: 81 y.o. DOB: January 23, 1919  [place] #: [name] Facility/Room:104 Level Of Care: SNF Provider: Wille Celeste Emergency Contacts: Extended Emergency Contact Information Primary Emergency Contact: [facility] Address: [place], [place] [address] [place] Phone: [phone] Relation: Daughter Secondary Emergency Contact: [place], [place] Mobile Phone: [phone] Relation: Son  Code Status:DNR   Allergies: Amoxicillin; Baclofen; Hctz; and Valium Chief complaint-acute visit secondary to increased lower extremity edema possible weight gain   HPI: Patient is 81 y.o. female who apparently is having some increased lower extremity edema-I had seen her previously for some increased pedal edema and a give her a short course of Lasix-however edema now appears to be somewhat more persistent.  She does not really complain of increased pain with this or any shortness of breath or cough  It appears she's had some weight gain about 4 pounds over the last few days although she does have some variability in weights apparently she is eating fairly well    per chart review I see she did have an echo done in October 2015 which showed an ejection fraction of 65% with some diastolic dysfunction.    Past Medical History  Diagnosis Date  . Hypertension   . Restless legs syndrome     on Requip  . Ulcer causing bleeding and hole in wall of stomach or small intestine   . Gastric ulcer     years ago  . Arthritis     Back, knees  . Constipation   . Urinary bladder calculus   . Urinary incontinence   . Bowel incontinence   . Aortic stenosis   . Stroke     Mini, no residual    Past Surgical History  Procedure Laterality Date  . Colon resection  50 years ago  .  Tonsillectomy    . Uterine fibroid surgery    . Lumbar laminectomy/decompression microdiscectomy  06/25/2011    Procedure: LUMBAR LAMINECTOMY/DECOMPRESSION MICRODISCECTOMY;  Surgeon: Hosie Spangle, MD;  Location: [facility];  Service: Neurosurgery;  Laterality: Right;  RIGHT Lumbar Two-Three hemilaminectomy and microdiskectomy  . Vaginal hysterectomy        Medication List       This list is accurate as of: 12/28/14 11:59 PM.  Always use your most recent med list.               amLODipine 5 MG tablet  Commonly known as:  NORVASC  Take 5 mg by mouth 2 (two) times daily. For HTN     docusate sodium 100 MG capsule  Commonly known as:  COLACE  Take 100 mg by mouth 2 (two) times daily.     fesoterodine 8 MG Tb24 tablet  Commonly known as:  TOVIAZ  Take 8 mg by mouth daily.     gabapentin 300 MG capsule  Commonly known as:  NEURONTIN  Take 1 capsule (300 mg total) by mouth 3 (three) times daily. Reduce your dose to 300 mg 3 x a day.     HYDROmorphone 4 MG tablet  Commonly known as:  DILAUDID  Take 2 mg by mouth 2 (two) times daily as needed for moderate pain or severe pain.  JUVEN Powd  Take 4 oz by mouth 2 (two) times daily.     ENSURE PO  Take 237 mLs by mouth 2 (two) times daily.     lactose free nutrition Liqd  Take 237 mLs by mouth 2 (two) times daily between meals.     morphine 15 MG tablet  Commonly known as:  MSIR  Take 1 tablet (15 mg total) by mouth every 8 (eight) hours.     polyethylene glycol packet  Commonly known as:  MIRALAX / GLYCOLAX  Take 17 g by mouth as needed for mild constipation.     rOPINIRole 2 MG tablet  Commonly known as:  REQUIP  Take 4 mg by mouth at bedtime.     rOPINIRole 2 MG tablet  Commonly known as:  REQUIP  TAKE 1-2 TABLETS BY MOUTH TWICE DAILY AS NEEDED (RESTLESS LEG).     sennosides-docusate sodium 8.6-50 MG tablet  Commonly known as:  SENOKOT-S  Take 2 tablets by mouth at bedtime.          Immunization  History  Administered Date(s) Administered  . Influenza Split 04/15/2012  . Influenza,inj,Quad PF,36+ Mos 03/06/2014  . PPD Test 08/30/2014  . Pneumococcal Polysaccharide-23 08/29/2013    Social History  Substance Use Topics  . Smoking status: Never Smoker   . Smokeless tobacco: Never Used  . Alcohol Use: Yes     Comment: rarely    Family history is noncontributory    Review of Systems  DATA OBTAINED: from patient, nurse, medical record,  GENERAL:  no fevers, fatigue, appetite changes--apparently eats fairly well appears to have gained some weight SKIN:  decubitus ulcer followed by wound care and apparently stable EYES: No eye pain, redness, discharge EARS: No earache, tinnitus, change in hearing NOSE: No congestion, drainage or bleeding  MOUTH/THROAT: No mouth or tooth pain, No sore throat RESPIRATORY: No cough, wheezing, SOB CARDIAC: No chest pain, palpitations, lower extremity edema noted be somewhat increased GI: No abdominal pain, No N/V/D or constipation, No heartburn or reflux  GU: No dysuria, frequency or urgency, or incontinence  MUSCULOSKELETAL: No unrelieved bone/joint pain NEUROLOGIC: No headache, dizziness or focal weakness PSYCHIATRIC: No overt anxiety or sadness, No behavior issue.   Filed Vitals:   12/28/14 1106  BP: 101/68  Pulse: 75  Temp: 97.8 F (36.6 C)  Resp: 16   is afebrile  Physical Exam  GENERAL APPEARANCE: Alert, conversant,  No acute distress.  SKIN: No diaphoresis rash decubitus ulcer sacral followed by wound care HEAD: Normocephalic, atraumatic  EYES: Conjunctiva/lids clear. Pupils round, reactive. EOMs intact.  EARS: External exam WNL, canals clear. Hearing grossly normal.  NOSE: No deformity or discharge.  MOUTH/THROAT: Lips w/o lesions oropharynx clear mucous membranes moist RESPIRATORY: Breathing is even, unlabored. Lung sounds are clear   CARDIOVASCULAR: Heart RRR  4/6 murmur,no  rubs or gallops. Appears to have some increased l  edema bilaterally I would say one plus this is cool to touch non-erythematous this is somewhat increased more on the right versus the left-- pedal pulses somewhat reduced bilaterally I suspect secondary to the edema.   GASTROINTESTINAL: Abdomen is soft, non-tender, protuberant w/ normal bowel sounds. GENITOURINARY: Bladder non tender, not distended  MUSCULOSKELETAL: No abnormal joints or musculature other than arthritic changes moves all extremities 4 she does ambulate with a walker NEUROLOGIC:  Cranial nerves 2-12 grossly intact. Moves all extremities  PSYCHIATRIC: Mood and affect appropriate to situation, no behavioral issues  Patient Active Problem List   Diagnosis  Date Noted  . Pain in joint, lower leg 10/31/2014  . Decubitus ulcer of sacral area 04/15/2014  . Moderate aortic stenosis 04/14/2014  . Pre-syncope 04/14/2014  . Hypotension 04/14/2014  . Acute on chronic renal failure 04/14/2014  . Anemia, iron deficiency 04/14/2014  . Protein-calorie malnutrition, severe 04/14/2014  . Altered mental status   . Dehydration 04/13/2014  . Altered mental state 04/13/2014  . Acute kidney injury   . Hypokalemia 03/25/2014  . Syncope 03/16/2014  . Acute encephalopathy 03/16/2014  . Leg swelling 03/02/2014  . Avascular necrosis of bone of right hip 09/27/2013  . Primary osteoarthritis of right hip 09/07/2013  . Spinal stenosis, lumbar region, with neurogenic claudication 09/07/2013  . Closed fracture of pubic ramus 09/07/2013  . BPPV (benign paroxysmal positional vertigo) 09/07/2013  . Pharyngeal swelling 08/30/2013  . Pelvic fracture 08/28/2013  . Fall 08/26/2013  . Dizziness 08/26/2013  . Abnormal stress test 08/26/2013  . Preop cardiovascular exam 08/01/2013  . Aortic stenosis 08/01/2013  . Edema 06/30/2013  . Loss of weight 01/04/2013  . Situational stress 01/04/2013  . Chronic right hip pain 07/05/2012  . Knee pain, acute 06/29/2012  . Vertigo 06/24/2012  . Acute exacerbation  of chronic low back pain 06/24/2012  . Lumbar pain with radiation down right leg 06/09/2012  . Osteoarthritis of hip 04/29/2012  . History of TIAs 04/17/2012  . S/P total hysterectomy and bilateral salpingo-oophorectomy 04/17/2012  . Hard of hearing 04/17/2012  . History of anemia 04/17/2012  . RLS (restless legs syndrome) 04/17/2012  . Chronic back pain 04/17/2012  . Spinal stenosis, lumbar 04/17/2012  . Lower back pain 12/20/2011  . Personal history of colonic polyps 10/08/2011  . H/O: CVA (cardiovascular accident) 09/14/2011  . Hypertension 09/14/2011  . Restless legs syndrome 09/14/2011  . Constipation 08/20/2011  . Urinary incontinence 08/20/2011   Labs.  12/11/2014.  Sodium 136 potassium 3.7 BUN 28 creatinine 0.9  11/01/2014.  WBC 6.4 hemoglobin 10.4 platelets 282.  Sodium 136 potassium 3.8 BUN 23 creatinine 0.8.  Albumin 3.4 otherwise liver function tests within normal limits.  TSH-2.90 CBC    Component Value Date/Time   WBC 7.6 09/06/2014 2058   RBC 3.51* 09/06/2014 2058   RBC 3.42* 06/28/2014 1205   HGB 9.7* 09/06/2014 2058   HCT 30.5* 09/06/2014 2058   PLT 236 09/06/2014 2058   MCV 86.9 09/06/2014 2058   LYMPHSABS 1.1 06/28/2014 1205   MONOABS 0.4 06/28/2014 1205   EOSABS 0.0 06/28/2014 1205   BASOSABS 0.0 06/28/2014 1205    CMP     Component Value Date/Time   NA 136 09/06/2014 2142   K 4.2 09/06/2014 2142   CL 99 09/06/2014 2142   CO2 27 09/06/2014 2142   GLUCOSE 119* 09/06/2014 2142   BUN 39* 09/06/2014 2142   CREATININE 0.76 09/06/2014 2142   CREATININE 0.78 06/28/2014 1205   CALCIUM 8.2* 09/06/2014 2142   PROT 5.4* 06/28/2014 1205   ALBUMIN 3.0* 06/28/2014 1205   AST 14 06/28/2014 1205   ALT <8 06/28/2014 1205   ALKPHOS 94 06/28/2014 1205   BILITOT 0.5 06/28/2014 1205   GFRNONAA 70* 09/06/2014 2142   GFRAA 81* 09/06/2014 2142    Assessment and Plan  . #1-increased lower extremity edema-she has received short courses of Lasix in  the past-at this point will make this routine 20 mg a day will give her 40 mg initially today Will supplement this with 10 mEq of potassium a day-obtain a CBC BMP BNP tomorrow.  Also monitor weights closely daily for 1 week end and reduce to 3 times a week notify provider of gain greater than 3 pounds.  Also update a BMP in 1 week to make sure renal function and electrolytes are stable.  Also we'll discontinue her Norvasc since this could lead to lower extremity edema as well.  Also would like to obtain a venous Doppler of the right leg since his has somewhat more increased edema compared to the left  [id number]        LASSEN, ARLO C,

## 2015-01-08 ENCOUNTER — Encounter: Payer: Self-pay | Admitting: Internal Medicine

## 2015-01-08 ENCOUNTER — Non-Acute Institutional Stay (SKILLED_NURSING_FACILITY): Payer: Medicare Other | Admitting: Internal Medicine

## 2015-01-08 DIAGNOSIS — R609 Edema, unspecified: Secondary | ICD-10-CM | POA: Diagnosis not present

## 2015-01-08 DIAGNOSIS — I10 Essential (primary) hypertension: Secondary | ICD-10-CM | POA: Diagnosis not present

## 2015-01-08 DIAGNOSIS — D649 Anemia, unspecified: Secondary | ICD-10-CM | POA: Diagnosis not present

## 2015-01-08 NOTE — Progress Notes (Signed)
Patient ID: [name], female   DOB: April 29, 1919, 81 y.o.   MRN: [id number]  MRN: [id number] Name: [facility]ly [facility]  Sex: female Age: 81 y.o. DOB: 29-Mar-1919  [facility]: [facility] Facility/Room:104 Level Of Care: SNF Provider: Wille [facility] Emergency Contacts: Extended Emergency Contact Information Primary Emergency Contact: [facility] Address: [place], [place] [address] [place] Phone: [phone] Relation: Daughter Secondary Emergency Contact: [facility], [place] of [facility] [facility] Mobile Phone: [phone] Relation: Son  Code Status:DNR   Allergies: Amoxicillin; Baclofen; Hctz; and Valium  Chief Complaint  Patient presents with  . Acute Visit   acute visit follow-up lower extremity edema-blood  pressure issues  HPI: Patient is 81 y.o. female  Was all recently for increased lower extremity edema and some mild weight gain-she was started on routine [facility] [facility] 20 mg a day as well as potassium 20 mEq a day-the edema appears to be significantly better she is wearing compression hose as well.  Lab work was ordered including a BNP which was unremarkable at 90.8   Per chart review I see she did have an echo done in October 2015 which showed an ejection fraction of 65% with some diastolic dysfunction.  She is also had variable blood pressures according to nursing she had a systolic around 932 earlier today however on recheck this was down into the 120s-I took it manually a couple hours later and got 120/68-per chart review it appears she has  variable blood pressures highest one that I see listed is 160/80-also see 94/42-one would wonder possibly about some machine variation here I did take the blood pressure manually as well as nursing took it manually earlier today as well Is not complaining of any increased dizziness syncope headache At one point was on Norvasc this has been discontinued mainly to some low  blood pressures at times listed as well as increased edema I suspect    Past Medical History  Diagnosis Date  . Hypertension   . Restless legs syndrome     on Requip  . Ulcer causing bleeding and hole in wall of stomach or small intestine   . Gastric ulcer     years ago  . Arthritis     Back, knees  . Constipation   . Urinary bladder calculus   . Urinary incontinence   . Bowel incontinence   . Aortic stenosis   . Stroke     Mini, no residual    Past Surgical History  Procedure Laterality Date  . Colon resection  50 years ago  . Tonsillectomy    . Uterine fibroid surgery    . Lumbar laminectomy/decompression microdiscectomy  06/25/2011    Procedure: LUMBAR LAMINECTOMY/DECOMPRESSION MICRODISCECTOMY;  Surgeon: Shelly Spangle, MD;  Location: [facility];  Service: Neurosurgery;  Laterality: Right;  RIGHT Lumbar Two-Three hemilaminectomy and microdiskectomy  . Vaginal hysterectomy        Medication List       This list is accurate as of: 01/08/15  8:59 PM.  Always use your most recent med list.               amLODipine 5 MG tablet  Commonly known as:  NORVASC  Take 5 mg by mouth 2 (two) times daily. For HTN     docusate sodium 100 MG capsule  Commonly known as:  COLACE  Take 100  mg by mouth 2 (two) times daily.     fesoterodine 8 MG Tb24 tablet  Commonly known as:  TOVIAZ  Take 8 mg by mouth daily.     furosemide 20 MG tablet  Commonly known as:  [facility]  Take 20 mg by mouth daily.     gabapentin 300 MG capsule  Commonly known as:  [facility]  Take 1 capsule (300 mg total) by mouth 3 (three) times daily. Reduce your dose to 300 mg 3 x a day.     HYDROmorphone 4 MG tablet  Commonly known as:  DILAUDID  Take 2 mg by mouth 2 (two) times daily as needed for moderate pain or severe pain.     JUVEN Powd  Take 4 oz by mouth 2 (two) times daily.     ENSURE PO  Take 237 mLs by mouth 2 (two) times daily.     lactose free nutrition Liqd  Take 237 mLs by mouth  2 (two) times daily between meals.     morphine 15 MG tablet  Commonly known as:  MSIR  Take 1 tablet (15 mg total) by mouth every 8 (eight) hours.     polyethylene glycol packet  Commonly known as:  MIRALAX / GLYCOLAX  Take 17 g by mouth as needed for mild constipation.     potassium chloride 10 MEQ tablet  Commonly known as:  [facility] [facility]  Take 10 mEq by mouth daily.     rOPINIRole 2 MG tablet  Commonly known as:  REQUIP  Take 4 mg by mouth at bedtime.     rOPINIRole 2 MG tablet  Commonly known as:  REQUIP  TAKE 1-2 TABLETS BY MOUTH TWICE DAILY AS NEEDED (RESTLESS LEG).     sennosides-docusate sodium 8.6-50 MG tablet  Commonly known as:  SENOKOT-S  Take 2 tablets by mouth at bedtime.        Meds ordered this encounter  Medications  . furosemide ([facility]) 20 MG tablet    Sig: Take 20 mg by mouth daily.  . potassium chloride ([facility]) 10 MEQ tablet    Sig: Take 10 mEq by mouth daily.    Immunization History  Administered Date(s) Administered  . Influenza Split 04/15/2012  . Influenza,inj,Quad PF,36+ Mos 03/06/2014  . PPD Test 08/30/2014  . Pneumococcal Polysaccharide-23 08/29/2013    Social History  Substance Use Topics  . Smoking status: Never Smoker   . Smokeless tobacco: Never Used  . Alcohol Use: Yes     Comment: rarely    Family history is noncontributory    Review of Systems  DATA OBTAINED: from patient, nurse, medical record,  GENERAL:  no fevers, fatigue, appetite changes SKIN: pain with decubitus ulcer followed by wound care and apparently stable EYES: No eye pain, redness, discharge EARS: No earache, tinnitus, change in hearing NOSE: No congestion, drainage or bleeding  MOUTH/THROAT: No mouth or tooth pain, No sore throat RESPIRATORY: No cough, wheezing, SOB CARDIAC: No chest pain, palpitations, lower extremity edema appears improved GI: No abdominal pain, No N/V/D or constipation, No heartburn or reflux  GU: No dysuria, frequency or urgency, or  incontinence  MUSCULOSKELETAL: No unrelieved bone/joint pain NEUROLOGIC: No headache, dizziness or focal weakness PSYCHIATRIC: No overt anxiety or sadness, No behavior issue.   Filed Vitals:   01/08/15 2054  BP: 120/68  Pulse: 78  Resp: 18   is afebrile  Physical Exam  GENERAL APPEARANCE: Alert, conversant,  No acute distress.  SKIN: No diaphoresis rash decubitus ulcer sacral followed  by wound care HEAD: Normocephalic, atraumatic  EYES: Conjunctiva/lids clear. Pupils round, reactive. EOMs intact.  EARS: External exam WNL, canals clear. Hearing grossly normal.  NOSE: No deformity or discharge.  MOUTH/THROAT: Oropharynx clear mucous membranes moist  RESPIRATORY: Breathing is even, unlabored. Lung sounds are clear   CARDIOVASCULAR: Heart RRR  4/6 murmur,no  rubs or gallops 1+ lower extremity edema  bilat she has holes applied bilaterally.   GASTROINTESTINAL: Abdomen is soft, non-tender, protuberant w/ normal bowel sounds.   MUSCULOSKELETAL: No abnormal joints or musculature other than arthritic changes moves all extremities 4 she does ambulate with a walker NEUROLOGIC:  Cranial nerves 2-12 grossly intact. Moves all extremities no lateralizing findings  PSYCHIATRIC: Mood and affect appropriate to situation, no behavioral issues  Patient Active Problem List   Diagnosis Date Noted  . Pain in joint, lower leg 10/31/2014  . Decubitus ulcer of sacral area 04/15/2014  . Moderate aortic stenosis 04/14/2014  . Pre-syncope 04/14/2014  . Hypotension 04/14/2014  . Acute on chronic renal failure 04/14/2014  . Anemia, iron deficiency 04/14/2014  . Protein-calorie malnutrition, severe 04/14/2014  . Altered mental status   . Dehydration 04/13/2014  . Altered mental state 04/13/2014  . Acute kidney injury   . Hypokalemia 03/25/2014  . Syncope 03/16/2014  . Acute encephalopathy 03/16/2014  . Leg swelling 03/02/2014  . Avascular necrosis of bone of right hip 09/27/2013  . Primary  osteoarthritis of right hip 09/07/2013  . Spinal stenosis, lumbar region, with neurogenic claudication 09/07/2013  . Closed fracture of pubic ramus 09/07/2013  . BPPV (benign paroxysmal positional vertigo) 09/07/2013  . Pharyngeal swelling 08/30/2013  . Pelvic fracture 08/28/2013  . Fall 08/26/2013  . Dizziness 08/26/2013  . Abnormal stress test 08/26/2013  . Preop cardiovascular exam 08/01/2013  . Aortic stenosis 08/01/2013  . Edema 06/30/2013  . Loss of weight 01/04/2013  . Situational stress 01/04/2013  . Chronic right hip pain 07/05/2012  . Knee pain, acute 06/29/2012  . Vertigo 06/24/2012  . Acute exacerbation of chronic low back pain 06/24/2012  . Lumbar pain with radiation down right leg 06/09/2012  . Osteoarthritis of hip 04/29/2012  . History of TIAs 04/17/2012  . S/P total hysterectomy and bilateral salpingo-oophorectomy 04/17/2012  . Hard of hearing 04/17/2012  . History of anemia 04/17/2012  . RLS (restless legs syndrome) 04/17/2012  . Chronic back pain 04/17/2012  . Spinal stenosis, lumbar 04/17/2012  . Lower back pain 12/20/2011  . Personal history of colonic polyps 10/08/2011  . H/O: CVA (cardiovascular accident) 09/14/2011  . Hypertension 09/14/2011  . Restless legs syndrome 09/14/2011  . Constipation 08/20/2011  . Urinary incontinence 08/20/2011   Labs.  12/28/2014.  Sodium 137 potassium 4.1 CO2 33-BUN 23 8 creatinine 0.8.  WBC 7.4 hemoglobin 10.6 platelets 270.  BNP-90.8  11/01/2014.  WBC 6.4 hemoglobin 10.4 platelets 282.  Sodium 136 potassium 3.8 BUN 23 creatinine 0.8.  Albumin 3.4 otherwise liver function tests within normal limits.  TSH-2.90 CBC    Component Value Date/Time   WBC 7.6 09/06/2014 2058   RBC 3.51* 09/06/2014 2058   RBC 3.42* 06/28/2014 1205   HGB 9.7* 09/06/2014 2058   HCT 30.5* 09/06/2014 2058   PLT 236 09/06/2014 2058   MCV 86.9 09/06/2014 2058   LYMPHSABS 1.1 06/28/2014 1205   MONOABS 0.4 06/28/2014 1205   EOSABS  0.0 06/28/2014 1205   BASOSABS 0.0 06/28/2014 1205    CMP     Component Value Date/Time   NA 136 09/06/2014 2142  K 4.2 09/06/2014 2142   CL 99 09/06/2014 2142   CO2 27 09/06/2014 2142   GLUCOSE 119* 09/06/2014 2142   BUN 39* 09/06/2014 2142   CREATININE 0.76 09/06/2014 2142   CREATININE 0.78 06/28/2014 1205   CALCIUM 8.2* 09/06/2014 2142   PROT 5.4* 06/28/2014 1205   ALBUMIN 3.0* 06/28/2014 1205   AST 14 06/28/2014 1205   ALT <8 06/28/2014 1205   ALKPHOS 94 06/28/2014 1205   BILITOT 0.5 06/28/2014 1205   GFRNONAA 70* 09/06/2014 2142   GFRAA 81* 09/06/2014 2142    Assessment and Plan  #1-edema this appears improved she is on low-dose [facility] with potassium and updated metabolic panel is pending her weight appears to be stable in the low 90s Her BNP was unremarkable at 90.8     #2 hypertension -again this is quite variable one would wonder possibly about some machine variations-she is asymptomatic of hypotension-at this point monitor pulse and blood pressure every shift-she is in no longer on Norvasc. November by provider systolic greater than 176 or diastolic greater than 98     #3-history of chronic pain with  osteoarthritis-spinal stenosis-- she is on morphine routinely as well as Dilaudid when necessary this appears to be relatively stable.   .  #8 anemia this appears stable most recent hemoglobin 10. On lab done 12/28/2014.  [id number]    Neftali Thurow C,

## 2015-01-24 ENCOUNTER — Encounter: Payer: Self-pay | Admitting: Internal Medicine

## 2015-02-05 ENCOUNTER — Other Ambulatory Visit: Payer: Self-pay | Admitting: Internal Medicine

## 2015-02-06 ENCOUNTER — Other Ambulatory Visit: Payer: Self-pay | Admitting: Internal Medicine

## 2015-02-06 DIAGNOSIS — M869 Osteomyelitis, unspecified: Secondary | ICD-10-CM

## 2015-02-16 ENCOUNTER — Ambulatory Visit (HOSPITAL_COMMUNITY)
Admission: RE | Admit: 2015-02-16 | Discharge: 2015-02-16 | Disposition: A | Discharge status: Home / Self Care | Payer: Medicare Other | Source: Ambulatory Visit | Attending: Internal Medicine | Admitting: Internal Medicine

## 2015-02-16 DIAGNOSIS — M869 Osteomyelitis, unspecified: Secondary | ICD-10-CM | POA: Insufficient documentation

## 2015-02-16 LAB — POCT I-STAT CREATININE: Creatinine, Ser: 1 mg/dL (ref 0.44–1.00)

## 2015-02-16 MED ORDER — GADOBENATE DIMEGLUMINE 529 MG/ML IV SOLN
10.0000 mL | Freq: Once | INTRAVENOUS | Status: AC | PRN
  Administered 2015-02-16: 9 mL via INTRAVENOUS

## 2015-02-19 ENCOUNTER — Encounter: Payer: Self-pay | Admitting: Internal Medicine

## 2015-02-19 ENCOUNTER — Non-Acute Institutional Stay (SKILLED_NURSING_FACILITY): Payer: Medicare Other | Admitting: Internal Medicine

## 2015-02-19 DIAGNOSIS — R609 Edema, unspecified: Secondary | ICD-10-CM | POA: Diagnosis not present

## 2015-02-19 DIAGNOSIS — M25551 Pain in right hip: Secondary | ICD-10-CM

## 2015-02-19 DIAGNOSIS — M1610 Unilateral primary osteoarthritis, unspecified hip: Secondary | ICD-10-CM

## 2015-02-19 DIAGNOSIS — G8929 Other chronic pain: Secondary | ICD-10-CM

## 2015-02-19 DIAGNOSIS — I10 Essential (primary) hypertension: Secondary | ICD-10-CM

## 2015-02-19 NOTE — Progress Notes (Signed)
Patient ID: Shel[facility], female   DOB: November 16, 1918, 81 y.o.   MRN: [id number]  MRN: [id number] Name: Shelly Martin  Sex: female Age: 81 y.o. DOB: [birth year]  [place] #: [name] Facility/Room:104 Level Of Care: SNF Provider: Wille [facility] Emergency Contacts: Extended Emergency Contact Information Primary Emergency Contact: [facility] Address: [place], [place] [address] [place] Phone: [phone] Relation: Daughter Secondary Emergency Contact: [place], [place] of [place] Mobile Phone: [phone] Relation: Son  Code Status:DNR   Allergies: Amoxicillin; Baclofen; Hctz; and Valium  Chief Complaint  Patient presents with  . Acute Visit  . Medical Management of Chronic Issues   acute visit secondary right shoulder right hip pain  HPI: Patient is 81 y.o. female who is admitted form her assisted living facility for wound care of a stage 4 decubitus ulcer.--This is followed by wound care and apparently is stable he  Recently an MRI was scheduled secondary to concerns of osteomyelitis however this was negative for osteomyelitis and again wound care is following this  She does complain somewhat chronic right hip--knee pain and actually at one point an x-ray showed a possible right tibial plateau fracture-however  fixed side x-ray did not show this  .  She also has a history of restless legs in continues on Requip.  She has a history of hypertension This appears stable recent blood pressures 130/60-102/51-at one point she had been on Norvasc but this has been discontinued.   When I saw her about a month ago there was some increased lower extremity edema and weight gain of about 7 pounds over 2 months-her Lasix was made routine at 20 mg a day and this appears to have help she is also wearing her compression hose edema appears to be improved.  Her chart review I see she did have an echo done  in October 2015 which showed an ejection fraction of 65% with some diastolic dysfunction  .    Past Medical History  Diagnosis Date  . Hypertension   . Restless legs syndrome     on Requip  . Ulcer causing bleeding and hole in wall of stomach or small intestine   . Gastric ulcer     years ago  . Arthritis     Back, knees  . Constipation   . Urinary bladder calculus   . Urinary incontinence   . Bowel incontinence   . Aortic stenosis   . Stroke     Mini, no residual    Past Surgical History  Procedure Laterality Date  . Colon resection  50 years ago  . Tonsillectomy    . Uterine fibroid surgery    . Lumbar laminectomy/decompression microdiscectomy  06/25/2011    Procedure: LUMBAR LAMINECTOMY/DECOMPRESSION MICRODISCECTOMY;  Surgeon: Hosie Spangle, MD;  Location: [facility];  Service: Neurosurgery;  Laterality: Right;  RIGHT Lumbar Two-Three hemilaminectomy and microdiskectomy  . Vaginal hysterectomy        Medication List       This list is accurate as of: 02/19/15  8:20 PM.  Always use your most recent med list.               amLODipine 5 MG tablet  Commonly known as:  NORVASC  Take 5 mg by mouth 2 (two) times daily. For HTN     docusate sodium 100 MG capsule  Commonly known as:  COLACE  Take 100 mg by mouth 2 (two) times daily.     fesoterodine 8 MG Tb24 tablet  Commonly known as:  TOVIAZ  Take 8 mg by mouth daily.     furosemide 20 MG tablet  Commonly known as:  LASIX  Take 20 mg by mouth daily.     gabapentin 300 MG capsule  Commonly known as:  NEURONTIN  Take 1 capsule (300 mg total) by mouth 3 (three) times daily. Reduce your dose to 300 mg 3 x a day.     HYDROmorphone 4 MG tablet  Commonly known as:  DILAUDID  Take 2 mg by mouth 2 (two) times daily as needed for moderate pain or severe pain.     JUVEN Powd  Take 4 oz by mouth 2 (two) times daily.     ENSURE PO  Take 237 mLs by mouth 2 (two) times daily.     lactose free nutrition Liqd   Take 237 mLs by mouth 2 (two) times daily between meals.     morphine 15 MG tablet  Commonly known as:  MSIR  Take 1 tablet (15 mg total) by mouth every 8 (eight) hours.     polyethylene glycol packet  Commonly known as:  MIRALAX / GLYCOLAX  Take 17 g by mouth as needed for mild constipation.     potassium chloride 10 MEQ tablet  Commonly known as:  K-DUR  Take 20 mEq by mouth daily.     rOPINIRole 2 MG tablet  Commonly known as:  REQUIP  Take 4 mg by mouth at bedtime.     rOPINIRole 2 MG tablet  Commonly known as:  REQUIP  TAKE 1-2 TABLETS BY MOUTH TWICE DAILY AS NEEDED (RESTLESS LEG).     sennosides-docusate sodium 8.6-50 MG tablet  Commonly known as:  SENOKOT-S  Take 2 tablets by mouth at bedtime.        No orders of the defined types were placed in this encounter.    Immunization History  Administered Date(s) Administered  . Influenza Split 04/15/2012  . Influenza,inj,Quad PF,36+ Mos 03/06/2014  . PPD Test 08/30/2014  . Pneumococcal Polysaccharide-23 08/29/2013    Social History  Substance Use Topics  . Smoking status: Never Smoker   . Smokeless tobacco: Never Used  . Alcohol Use: Yes     Comment: rarely    Family history is noncontributory    Review of Systems  DATA OBTAINED: from patient, nurse, medical record,  GENERAL:  no fevers, fatigue, appetite changes SKIN: pain with decubitus ulcer followed by wound care and apparently stable per review of MRI done on September 23 no sign of osteomyelitis EYES: No eye pain, redness, discharge EARS: No earache, tinnitus, change in hearing NOSE: No congestion, drainage or bleeding  MOUTH/THROAT: No mouth or tooth pain, No sore throat RESPIRATORY: No cough, wheezing, SOB CARDIAC: No chest pain, palpitations, lower extremity edema noted in the feet bilaterally  GI: No abdominal pain, No N/V/D or constipation, No heartburn or reflux  GU: No dysuria, frequency or urgency, or incontinence  MUSCULOSKELETAL:  Complains of some right shoulder hip and knee pain a-- this is somewhat chronic NEUROLOGIC: No headache, dizziness or focal weakness PSYCHIATRIC: No overt anxiety or sadness, No behavior issue.   Filed Vitals:   02/19/15 1648  BP: 130/60  Pulse: 70  Resp: 20   is afebrile  Physical Exam  GENERAL APPEARANCE: Alert, conversant,  No acute distress.  SKIN: No diaphoresis rash  decubitus ulcer sacral followed by wound care HEAD: Normocephalic, atraumatic  EYES: Conjunctiva/lids clear. Pupils round, reactive. EOMs intact.  EARS: External exam WNL, canals clear. Hearing grossly normal.  NOSE: No deformity or discharge.  MOUTH/THROAT: Lips w/o lesions oropharynx clear mucous membranes moist  RESPIRATORY: Breathing is even, unlabored. Lung sounds are clear   CARDIOVASCULAR: Heart RRR  4/6 murmur,no  rubs or gallops. Edema appears to be improved I would say trace to 1+ she has compression hose on.   GASTROINTESTINAL: Abdomen is soft, non-tender, protuberant w/ normal bowel sounds. GENITOURINARY: Bladder non tender, not distended  MUSCULOSKELETAL:  Moves all extremities 4 she ambulates with a walker-I did not note any deformity of her hip or knee other than arthritic changes of her knee-there is no deformity noted noticeable on her right shoulder she does have limited range of motion of her right shoulder NEUROLOGIC:  Cranial nerves 2-12 grossly intact. Moves all extremities  PSYCHIATRIC: Mood and affect appropriate to situation, no behavioral issues  Patient Active Problem List   Diagnosis Date Noted  . Pain in joint, lower leg 10/31/2014  . Decubitus ulcer of sacral area 04/15/2014  . Moderate aortic stenosis 04/14/2014  . Pre-syncope 04/14/2014  . Hypotension 04/14/2014  . Acute on chronic renal failure 04/14/2014  . Anemia, iron deficiency 04/14/2014  . Protein-calorie malnutrition, severe 04/14/2014  . Altered mental status   . Dehydration 04/13/2014  . Altered mental state  04/13/2014  . Acute kidney injury   . Hypokalemia 03/25/2014  . Syncope 03/16/2014  . Acute encephalopathy 03/16/2014  . Leg swelling 03/02/2014  . Avascular necrosis of bone of right hip 09/27/2013  . Primary osteoarthritis of right hip 09/07/2013  . Spinal stenosis, lumbar region, with neurogenic claudication 09/07/2013  . Closed fracture of pubic ramus 09/07/2013  . BPPV (benign paroxysmal positional vertigo) 09/07/2013  . Pharyngeal swelling 08/30/2013  . Pelvic fracture 08/28/2013  . Fall 08/26/2013  . Dizziness 08/26/2013  . Abnormal stress test 08/26/2013  . Preop cardiovascular exam 08/01/2013  . Aortic stenosis 08/01/2013  . Edema 06/30/2013  . Loss of weight 01/04/2013  . Situational stress 01/04/2013  . Chronic right hip pain 07/05/2012  . Knee pain, acute 06/29/2012  . Vertigo 06/24/2012  . Acute exacerbation of chronic low back pain 06/24/2012  . Lumbar pain with radiation down right leg 06/09/2012  . Osteoarthritis of hip 04/29/2012  . History of TIAs 04/17/2012  . S/P total hysterectomy and bilateral salpingo-oophorectomy 04/17/2012  . Hard of hearing 04/17/2012  . History of anemia 04/17/2012  . RLS (restless legs syndrome) 04/17/2012  . Chronic back pain 04/17/2012  . Spinal stenosis, lumbar 04/17/2012  . Lower back pain 12/20/2011  . Personal history of colonic polyps 10/08/2011  . H/O: CVA (cardiovascular accident) 09/14/2011  . Hypertension 09/14/2011  . Restless legs syndrome 09/14/2011  . Constipation 08/20/2011  . Urinary incontinence 08/20/2011   Labs.  02/02/2015.  Sodium 140 potassium 4.4 BUN 40 creatinine 0.9.    11/01/2014.  WBC 6.4 hemoglobin 10.4 platelets 282.  Sodium 136 potassium 3.8 BUN 23 creatinine 0.8.  Albumin 3.4 otherwise liver function tests within normal limits.  TSH-2.90 CBC    Component Value Date/Time   WBC 7.6 09/06/2014 2058   RBC 3.51* 09/06/2014 2058   RBC 3.42* 06/28/2014 1205   HGB 9.7* 09/06/2014 2058    HCT 30.5* 09/06/2014 2058   PLT 236 09/06/2014 2058   MCV 86.9 09/06/2014 2058   LYMPHSABS 1.1 06/28/2014 1205   MONOABS  0.4 06/28/2014 1205   EOSABS 0.0 06/28/2014 1205   BASOSABS 0.0 06/28/2014 1205    CMP     Component Value Date/Time   NA 136 09/06/2014 2142   K 4.2 09/06/2014 2142   CL 99 09/06/2014 2142   CO2 27 09/06/2014 2142   GLUCOSE 119* 09/06/2014 2142   BUN 39* 09/06/2014 2142   CREATININE 1.00 02/16/2015 0932   CREATININE 0.78 06/28/2014 1205   CALCIUM 8.2* 09/06/2014 2142   PROT 5.4* 06/28/2014 1205   ALBUMIN 3.0* 06/28/2014 1205   AST 14 06/28/2014 1205   ALT <8 06/28/2014 1205   ALKPHOS 94 06/28/2014 1205   BILITOT 0.5 06/28/2014 1205   GFRNONAA 70* 09/06/2014 2142   GFRAA 81* 09/06/2014 2142    Assessment and Plan  Edema with suspected history of diastolic CHF-this appears stable edema appears improved she is on Lasix with potassium supplementation-potassium at one point was slightly low consult was increased we will update a metabolic panel to insure stability   #2 hypertension this appears to be stable recent blood pressures are 130/60-102/51-her Norvasc has been discontinued.  #3 she is on Remeron low-dose I suspect for possible weight loss this has reversed itself at this point continue to monitor.  #4-history of chronic pain with  osteoarthritis-spinal stenosis-- she is on morphine routinely as well as Dilaudid when necessary this appears to be relatively stable.-She is complaining at times of some right sided hip knee and shoulder pain Will update x-rays although I suspect were talking more about chronic arthritic changes degeneration  #5-history of decubitus ulcer sacral this is followed by wound care as noted above.  #6-history restless legs she is on Requip she does not complain of issues today .  7- anemia this appears stable most recent hemoglobin 10.4 on 11/01/2014 will update a CBC as well.  [id number]   LASSEN, ARLO C,

## 2015-02-22 ENCOUNTER — Encounter: Payer: Self-pay | Admitting: Internal Medicine

## 2015-02-22 ENCOUNTER — Non-Acute Institutional Stay (SKILLED_NURSING_FACILITY): Payer: Medicare Other | Admitting: Internal Medicine

## 2015-02-22 DIAGNOSIS — I10 Essential (primary) hypertension: Secondary | ICD-10-CM

## 2015-02-22 DIAGNOSIS — S72413A Displaced unspecified condyle fracture of lower end of unspecified femur, initial encounter for closed fracture: Secondary | ICD-10-CM

## 2015-02-22 DIAGNOSIS — M1711 Unilateral primary osteoarthritis, right knee: Secondary | ICD-10-CM | POA: Insufficient documentation

## 2015-02-22 DIAGNOSIS — M179 Osteoarthritis of knee, unspecified: Secondary | ICD-10-CM

## 2015-02-22 DIAGNOSIS — R634 Abnormal weight loss: Secondary | ICD-10-CM | POA: Diagnosis not present

## 2015-02-22 NOTE — Progress Notes (Signed)
Patient ID: Orella Cushman, female   DOB: 10/11/1918, 81 y.o.   MRN: [id number]   MRN: [id number] Name: Khara Renaud  Sex: female Age: 81 y.o. DOB: 1919/04/22  [place] #: [name] Facility/Room:104 Level Of Care: SNF [facility] [place]son: [facility] Emergency Contacts: Extended Emergency Contact Information Primary Emergency Contact: [facility] Address: [place], [place] [address] [place] Phone: [phone] Relation: Daughter Secondary Emergency Contact: [place], [place] Mobile Phone: [phone] Relation: Son  Code Status:DNR   Allergies: Amoxicillin; Baclofen; Hctz; and Valium  Chief Complaint  Patient presents with  . Acute Visit   acute visit follow-up right hip and knee pain-patient concerns about not gaining weight-follow-up hypertension  HPI: Patient is 81 y.o. female who is admitted form her assisted living facility for wound care of a stage 4 decubitus ulcer.--This is followed by wound care and apparently is stable he  Recently an MRI was scheduled secondary to concerns of osteomyelitis however this was negative for osteomyelitis and again wound care is following this  She does complain somewhat chronic right hip--knee pain and actually at one point an x-ray showed a possible right tibial plateau fracture-however  fixed side x-ray did not show this  I did order an x-ray and this has come back with somewhat complex results right shoulder x-ray showed essentially degenerative disease with no fracture right hip also showed degenerative change with no fracture-right femur x-ray showed a focal irregularity of the lateral condyle suspicious for nondisplaced fracture area  Right knee x-ray also showed a curvilinear lunacy in the lateral condyle consistent with an acute to subacute nondisplaced fracture.  She does have somewhat chronic right leg and hip pain she does not specifically  complain of knee pain today however nursing staff says over the last few days it appears she's had some decreased mobility of her right leg and the knee area.  She continues to ambulate in her walker    She has a history of hypertension  Occasionally I will see systolics in the 62I but this is not consistent in today I got 140/80 sitting and 136/80 standing-she does not complain of any dizziness or orthostatic symptoms one would wonder whether the lower readings at times are more a function of getting her blood pressure taken by machine.  She been on Norvasc at one point this has been discontinued.  Patient weight has been stable in the 90s for some time-however she continues to be concerned that she eats well but does not appear to be gaining weight---she feels may be something happening at her GI track although she is not really specific about this--- per chart review I could not really see extensive GI appointments however-I do see she had one back in May 2013 this appeared to be more in reference to chronic idiopathic constipation she saw Dr. Deatra Ina this has not really been an issue recently to my knowledge.  She also had a colonoscopy apparently in 2013 which showed a adenomatous polyp     When I saw her about a month ago there was some increased lower extremity edema and weight gain of about 7 pounds over 2 months-her Lasix was made routine at 20 mg a day and this appears to have help she is also wearing her compression hose edema appears to be improved.--We have been following serial metabolic panels to ensure  stability of her renal function-this appears to be stable per lab done on September 27 with a creatinine of 1.0 BUN of 37-her CO2 level slightly elevated at 34 which appears to be relatively her baseline  Per chart review I see she did have an echo done in October 2015 which showed an ejection fraction of 65% with some diastolic dysfunction  .    Past Medical History   Diagnosis Date  . Hypertension   . Restless legs syndrome     on Requip  . Ulcer causing bleeding and hole in wall of stomach or small intestine   . Gastric ulcer     years ago  . Arthritis     Back, knees  . Constipation   . Urinary bladder calculus   . Urinary incontinence   . Bowel incontinence   . Aortic stenosis   . Stroke     Mini, no residual    Past Surgical History  Procedure Laterality Date  . Colon resection  50 years ago  . Tonsillectomy    . Uterine fibroid surgery    . Lumbar laminectomy/decompression microdiscectomy  06/25/2011    Procedure: LUMBAR LAMINECTOMY/DECOMPRESSION MICRODISCECTOMY;  Surgeon: Hosie Spangle, MD;  Location: [facility];  Service: Neurosurgery;  Laterality: Right;  RIGHT Lumbar Two-Three hemilaminectomy and microdiskectomy  . Vaginal hysterectomy        Medication List       This list is accurate as of: 02/22/15 10:52 PM.  Always use your most recent med list.               amLODipine 5 MG tablet  Commonly known as:  NORVASC  Take 5 mg by mouth 2 (two) times daily. For HTN     docusate sodium 100 MG capsule  Commonly known as:  COLACE  Take 100 mg by mouth 2 (two) times daily.     fesoterodine 8 MG Tb24 tablet  Commonly known as:  TOVIAZ  Take 8 mg by mouth daily.     furosemide 20 MG tablet  Commonly known as:  LASIX  Take 20 mg by mouth daily.     gabapentin 300 MG capsule  Commonly known as:  NEURONTIN  Take 1 capsule (300 mg total) by mouth 3 (three) times daily. Reduce your dose to 300 mg 3 x a day.     HYDROmorphone 4 MG tablet  Commonly known as:  DILAUDID  Take 2 mg by mouth 2 (two) times daily as needed for moderate pain or severe pain.     JUVEN Powd  Take 4 oz by mouth 2 (two) times daily.     ENSURE PO  Take 237 mLs by mouth 2 (two) times daily.     lactose free nutrition Liqd  Take 237 mLs by mouth 2 (two) times daily between meals.     morphine 15 MG tablet  Commonly known as:  MSIR  Take  1 tablet (15 mg total) by mouth every 8 (eight) hours.     polyethylene glycol packet  Commonly known as:  MIRALAX / GLYCOLAX  Take 17 g by mouth as needed for mild constipation.     potassium chloride 10 MEQ tablet  Commonly known as:  K-DUR  Take 20 mEq by mouth daily.     rOPINIRole 2 MG tablet  Commonly known as:  REQUIP  Take 4 mg by mouth at bedtime.     rOPINIRole 2 MG tablet  Commonly known as:  REQUIP  TAKE 1-2 TABLETS BY MOUTH TWICE DAILY AS NEEDED (RESTLESS LEG).     sennosides-docusate sodium 8.6-50 MG tablet  Commonly known as:  SENOKOT-S  Take 2 tablets by mouth at bedtime.       of note her Norvasc has been discontinued    Immunization History  Administered Date(s) Administered  . Influenza Split 04/15/2012  . Influenza,inj,Quad PF,36+ Mos 03/06/2014  . PPD Test 08/30/2014  . Pneumococcal Polysaccharide-23 08/29/2013    Social History  Substance Use Topics  . Smoking status: Never Smoker   . Smokeless tobacco: Never Used  . Alcohol Use: Yes     Comment: rarely    Family history is noncontributory    Review of Systems  DATA OBTAINED: from patient, nurse, medical record,  GENERAL:  no fevers, fatigue, appetite changes SKIN: pain with decubitus ulcer followed by wound care and apparently stable per review of MRI done on September 23 no sign of osteomyelitis EYES: No eye pain, redness, discharge EARS: No earache, tinnitus, change in hearing NOSE: No congestion, drainage or bleeding  MOUTH/THROAT: No mouth or tooth pain, No sore throat RESPIRATORY: No cough, wheezing, SOB CARDIAC: No chest pain, palpitations, lower extremity edema noted in the feet bilaterally  GI: No abdominal pain, No N/V/D or constipation, No heartburn or reflux  GU: No dysuria, frequency or urgency, or incontinence  MUSCULOSKELETAL: Complains of some right shoulder hip and knee pain a-- this is somewhat chronic--but according nursing staff she does appear to have some more  stiffness of her right leg knee area NEUROLOGIC: No headache, dizziness or focal weakness PSYCHIATRIC: No overt anxiety or sadness, No behavior issue.   Filed Vitals:   02/22/15 1436  BP: 130/62  Pulse: 92  Resp: 18   is afebrile  Physical Exam  GENERAL APPEARANCE: Alert, conversant,  No acute distress.  SKIN: No diaphoresis rash decubitus ulcer sacral followed by wound care HEAD: Normocephalic, atraumatic  EYES: Conjunctiva/lids clear. Pupils round, reactive. EOMs intact.  EARS: External exam WNL, canals clear. Hearing grossly normal.  NOSE: No deformity or discharge.  MOUTH/THROAT: Lips w/o lesions oropharynx clear mucous membranes moist  RESPIRATORY: Breathing is even, unlabored. Lung sounds are clear   CARDIOVASCULAR: Heart RRR  4/6 murmur,no  rubs or gallops. Edema appears to be improved I would say trace to 1+ she has compression hose on.   GASTROINTESTINAL: Abdomen is soft, non-tender, protuberant w/ normal bowel sounds. GENITOURINARY: Bladder non tender, not distended  MUSCULOSKELETAL:  Moves all extremities 4 she ambulates with a walker-I did not note any deformity of her hip or knee other than arthritic changes of her knee- There is no obvious deformity of her right knee-or for that matter tenderness to palpation today-she does appear to have some stiffness in movement of her right leg and knee area--again nursing staff does state they have noticed some changes in her mobility over the last few days Do not see any bruising-of the area--does not appear to have acute pain here NEUROLOGIC:  Cranial nerves 2-12 grossly intact. Moves all extremities  PSYCHIATRIC: Mood and affect appropriate to situation, no behavioral issues  Patient Active Problem List   Diagnosis Date Noted  . Osteoarthritis of right knee 02/22/2015  . Pain in joint, lower leg 10/31/2014  . Decubitus ulcer of sacral area 04/15/2014  . Moderate aortic stenosis 04/14/2014  . Pre-syncope 04/14/2014  .  Hypotension 04/14/2014  . Acute on chronic renal failure 04/14/2014  . Anemia, iron deficiency 04/14/2014  . Protein-calorie malnutrition, severe  04/14/2014  . Altered mental status   . Dehydration 04/13/2014  . Altered mental state 04/13/2014  . Acute kidney injury   . Hypokalemia 03/25/2014  . Syncope 03/16/2014  . Acute encephalopathy 03/16/2014  . Leg swelling 03/02/2014  . Avascular necrosis of bone of right hip 09/27/2013  . Primary osteoarthritis of right hip 09/07/2013  . Spinal stenosis, lumbar region, with neurogenic claudication 09/07/2013  . Closed fracture of pubic ramus 09/07/2013  . BPPV (benign paroxysmal positional vertigo) 09/07/2013  . Pharyngeal swelling 08/30/2013  . Pelvic fracture 08/28/2013  . Fall 08/26/2013  . Dizziness 08/26/2013  . Abnormal stress test 08/26/2013  . Preop cardiovascular exam 08/01/2013  . Aortic stenosis 08/01/2013  . Edema 06/30/2013  . Loss of weight 01/04/2013  . Situational stress 01/04/2013  . Chronic right hip pain 07/05/2012  . Knee pain, acute 06/29/2012  . Vertigo 06/24/2012  . Acute exacerbation of chronic low back pain 06/24/2012  . Lumbar pain with radiation down right leg 06/09/2012  . Osteoarthritis of hip 04/29/2012  . History of TIAs 04/17/2012  . S/P total hysterectomy and bilateral salpingo-oophorectomy 04/17/2012  . Hard of hearing 04/17/2012  . History of anemia 04/17/2012  . RLS (restless legs syndrome) 04/17/2012  . Chronic back pain 04/17/2012  . Spinal stenosis, lumbar 04/17/2012  . Lower back pain 12/20/2011  . Personal history of colonic polyps 10/08/2011  . H/O: CVA (cardiovascular accident) 09/14/2011  . Hypertension 09/14/2011  . Restless legs syndrome 09/14/2011  . Constipation 08/20/2011  . Urinary incontinence 08/20/2011   Labs.  02/20/2015.  WBC 6.7 hemoglobin 10.3 platelets 243.  Sodium 140 potassium 4 BUN 37 creatinine 1.0  02/02/2015.  Sodium 140 potassium 4.4 BUN 40 creatinine  0.9.    11/01/2014.  WBC 6.4 hemoglobin 10.4 platelets 282.  Sodium 136 potassium 3.8 BUN 23 creatinine 0.8.  Albumin 3.4 otherwise liver function tests within normal limits.  TSH-2.90 CBC    Component Value Date/Time   WBC 7.6 09/06/2014 2058   RBC 3.51* 09/06/2014 2058   RBC 3.42* 06/28/2014 1205   HGB 9.7* 09/06/2014 2058   HCT 30.5* 09/06/2014 2058   PLT 236 09/06/2014 2058   MCV 86.9 09/06/2014 2058   LYMPHSABS 1.1 06/28/2014 1205   MONOABS 0.4 06/28/2014 1205   EOSABS 0.0 06/28/2014 1205   BASOSABS 0.0 06/28/2014 1205    CMP     Component Value Date/Time   NA 136 09/06/2014 2142   K 4.2 09/06/2014 2142   CL 99 09/06/2014 2142   CO2 27 09/06/2014 2142   GLUCOSE 119* 09/06/2014 2142   BUN 39* 09/06/2014 2142   CREATININE 1.00 02/16/2015 0932   CREATININE 0.78 06/28/2014 1205   CALCIUM 8.2* 09/06/2014 2142   PROT 5.4* 06/28/2014 1205   ALBUMIN 3.0* 06/28/2014 1205   AST 14 06/28/2014 1205   ALT <8 06/28/2014 1205   ALKPHOS 94 06/28/2014 1205   BILITOT 0.5 06/28/2014 1205   GFRNONAA 70* 09/06/2014 2142   GFRAA 81* 09/06/2014 2142    Assessment and Plan      #1 hypertension At this point continue to monitor again blood pressure systolically largely in the 130s today sitting and standing-- -occasionally systolics in the 27P per machine she does not appear to be symptomatic of any orthostatic hypotension at this point monitor-encourage manual readings if at all possible.     #2-history of chronic pain with  osteoarthritis-spinal stenosis-- she is on morphine routinely as well as Dilaudid when necessary this appears to  be relatively stable.-She is complaining at times of some right sided hip knee and shoulder pain at this point would be hesitant to increase her medications secondary to fragile status this appears per nursing discussion to be fairly well controlled-but will continue to monitor- Fact patient usually somewhat variable complaints--saying at  times  she has pain --and  then shortly thereafter saying she doesn't want any more pain medication--concern that she doesn't want to take too much  At this point monitor.  #3-x-ray showing possible lateral condyle fracture on the right nondisplaced-this was discussed extensively with Dr. [name] via phone-we willorder a fixed side x-ray as soon as possible-also nonweightbearing until x-ray results are known-I did discuss this with the patient-I suspect this may be a challenge however with patient's history of ambulating-    #4-history of weight issues-patient's weight has been stable--her edema has improved and this may result in some weight decline which may be part of the reason she is not gaining weight-she does have some history of failure to thrive but appears per nursing to be doing fairly well-however she is concerned she is not gaining a significant amount of weight because she eats well-she really would like a GI consult and will order this-- she would like Dr. Maurene Capes and will write for Dr. Maurene Capes consult--previous GI workup as noted above   [id number]-of note greater than 40 minutes spent assessing patient-discussing her concerns at bedside-discussing her status with nursing staff-reviewing her chart-and coordinating and formulating a plan of care for numerous diagnoses-of note greater than 50% of time spent coordinating plan of care with extensive patient input t   LASSEN, ARLO C,

## 2015-02-23 ENCOUNTER — Encounter: Payer: Self-pay | Admitting: Gastroenterology

## 2015-02-26 ENCOUNTER — Non-Acute Institutional Stay (SKILLED_NURSING_FACILITY): Payer: Medicare Other | Admitting: Internal Medicine

## 2015-02-26 DIAGNOSIS — J029 Acute pharyngitis, unspecified: Secondary | ICD-10-CM | POA: Diagnosis not present

## 2015-02-26 NOTE — Progress Notes (Signed)
Patient ID: Shelly Martin, female   DOB: July 08, 1918, 81 y.o.   MRN: [id number]    MRN: [id number] Name: Shelly Martin  Sex: female Age: 81 y.o. DOB: 12-Jan-1919  [facility] #: [name] Facility/Room:104 Level Of Care: SNF Provider: Wille Celeste Emergency Contacts: Extended Emergency Contact Information Primary Emergency Contact: [facility] Address: [place], [place] [address] [place] Phone: [phone] Relation: Daughter Secondary Emergency Contact: [place], [place] Mobile Phone: [phone] Relation: Son  Code Status:DNR   Allergies: Amoxicillin; Baclofen; Hctz; and Valium  Chief Complaint  Patient presents with  . Acute Visit   Secondary to sore throat  HPI: Patient is 81 y.o. female was complaining of a sore throat today her vital signs are stable she is not complaining of any cough shortness of breath or wheezing just essentially says her throat feels somewhat sore    She does complain somewhat chronic right hip--knee pain and actually at one point an x-ray showed a possible right tibial plateau fracture-however  fixed side x-ray did not show this  I did order an x-ray and this came back with somewhat complex results right shoulder x-ray showed essentially degenerative disease with no fracture right hip also showed degenerative change with no fracture-right femur x-ray showed a focal irregularity of the lateral condyle suspicious for nondisplaced fracture area  Right knee x-ray also showed a curvilinear lunacy in the lateral condyle consistent with an acute to subacute nondisplaced fracture.  A fixed site  x-ray is pending she has been made nonweightbearing although this is a challenge since patient does like to ambulate with a walker-this pain actually does not appear to be increased from her baseline she appears to be stable in this regard but will need to get an x-ray  here           .    Past Medical History  Diagnosis Date  . Hypertension   . Restless legs syndrome     on Requip  . Ulcer causing bleeding and hole in wall of stomach or small intestine   . Gastric ulcer     years ago  . Arthritis     Back, knees  . Constipation   . Urinary bladder calculus   . Urinary incontinence   . Bowel incontinence   . Aortic stenosis   . Stroke     Mini, no residual    Past Surgical History  Procedure Laterality Date  . Colon resection  50 years ago  . Tonsillectomy    . Uterine fibroid surgery    . Lumbar laminectomy/decompression microdiscectomy  06/25/2011    Procedure: LUMBAR LAMINECTOMY/DECOMPRESSION MICRODISCECTOMY;  Surgeon: Hosie Spangle, MD;  Location: [facility];  Service: Neurosurgery;  Laterality: Right;  RIGHT Lumbar Two-Three hemilaminectomy and microdiskectomy  . Vaginal hysterectomy        Medication List       This list is accurate as of: 02/26/15 11:59 PM.  Always use your most recent med list.               amLODipine 5 MG tablet  Commonly known as:  NORVASC  Take 5 mg by mouth 2 (two) times daily. For HTN     docusate sodium 100 MG capsule  Commonly known as:  COLACE  Take 100 mg by mouth 2 (two) times daily.  fesoterodine 8 MG Tb24 tablet  Commonly known as:  TOVIAZ  Take 8 mg by mouth daily.     furosemide 20 MG tablet  Commonly known as:  LASIX  Take 20 mg by mouth daily.     gabapentin 300 MG capsule  Commonly known as:  NEURONTIN  Take 1 capsule (300 mg total) by mouth 3 (three) times daily. Reduce your dose to 300 mg 3 x a day.     HYDROmorphone 4 MG tablet  Commonly known as:  DILAUDID  Take 2 mg by mouth 2 (two) times daily as needed for moderate pain or severe pain.     JUVEN Powd  Take 4 oz by mouth 2 (two) times daily.     ENSURE PO  Take 237 mLs by mouth 2 (two) times daily.     lactose free nutrition Liqd  Take 237 mLs by mouth 2 (two) times daily between meals.      morphine 15 MG tablet  Commonly known as:  MSIR  Take 1 tablet (15 mg total) by mouth every 8 (eight) hours.     polyethylene glycol packet  Commonly known as:  MIRALAX / GLYCOLAX  Take 17 g by mouth as needed for mild constipation.     potassium chloride 10 MEQ tablet  Commonly known as:  K-DUR  Take 20 mEq by mouth daily.     rOPINIRole 2 MG tablet  Commonly known as:  REQUIP  Take 4 mg by mouth at bedtime.     rOPINIRole 2 MG tablet  Commonly known as:  REQUIP  TAKE 1-2 TABLETS BY MOUTH TWICE DAILY AS NEEDED (RESTLESS LEG).     sennosides-docusate sodium 8.6-50 MG tablet  Commonly known as:  SENOKOT-S  Take 2 tablets by mouth at bedtime.       of note her Norvasc has been discontinued    Immunization History  Administered Date(s) Administered  . Influenza Split 04/15/2012  . Influenza,inj,Quad PF,36+ Mos 03/06/2014  . PPD Test 08/30/2014  . Pneumococcal Polysaccharide-23 08/29/2013    Social History  Substance Use Topics  . Smoking status: Never Smoker   . Smokeless tobacco: Never Used  . Alcohol Use: Yes     Comment: rarely    Family history is noncontributory    Review of Systems  DATA OBTAINED: from patient, nurse, medical record,  GENERAL:  no fevers, fatigue, appetite changes SKIN: pain with decubitus ulcer followed by wound care and apparently stable per review of MRI done on September 23 no sign of osteomyelitis EYES: No eye pain, redness, discharge EARS: No earache, tinnitus, change in hearing  NOSE: No congestion, drainage or bleeding  MOUTH/THROAT: No mouth or tooth pain, has a sore throat RESPIRATORY: No cough, wheezing, SOB CARDIAC: No chest pain, palpitations, lower extremity edema noted in the feet bilaterally  GI: No abdominal pain, No N/V/D or constipation, No heartburn or reflux  GU: No dysuria, frequency or urgency, or incontinence  MUSCULOSKELETAL: Complains of some right shoulder hip and knee pain a-- this is somewhat chronic--but  according nursing staff she does appear to have some more stiffness of her right leg knee arearecently NEUROLOGIC: No headache, dizziness or focal weakness PSYCHIATRIC: No overt anxiety or sadness, No behavior issue.   Filed Vitals:   02/26/15 2217  BP: 129/60  Pulse: 70  Temp: 98.8 F (37.1 C)  Resp: 18   is afebrile  Physical Exam  GENERAL APPEARANCE: Alert, conversant,  No acute distress.  SKIN: No diaphoresis  rash decubitus ulcer sacral followed by wound care HEAD: Normocephalic, atraumatic  EYES: Conjunctiva/lids clear. Pupils round, reactive. EOMs intact.  EARS: External exam WNL, canals clear. Hearing grossly normal.  NOSE: No deformity or discharge.  MOUTH/THROAT: Lips w/o lesions oropharynx clear mucous membranes moistthroat exam occurs quite benign possibly minimally increased erythema oropharynx but this is quite mild-there is no exudate  RESPIRATORY: Breathing is even, unlabored. Lung sounds are clear   CARDIOVASCULAR: Heart RRR  4/6 murmur,no  rubs or gallops. Edema appears to be improved I would say trace to 1+ she has compression hose on.   GASTROINTESTINAL: Abdomen is soft, non-tender, protuberant w/ normal bowel sounds. GENITOURINARY: Bladder non tender, not distended  MUSCULOSKELETAL:  Moves all extremities 4 she ambulates with a walker-I did not note any deformity of her hip or knee other than arthritic changes of her knee- There is no obvious deformity of her right knee-or for that matter tenderness to palpation today-she does appear to have some stiffness in movement of her right leg and knee area--again nursing staff does state they have noticed some changes in her mobility over the last week or so Do not see any bruising-of the area--does not appear to have acute pain here NEUROLOGIC:  Cranial nerves 2-12 grossly intact. Moves all extremities  PSYCHIATRIC: Mood and affect appropriate to situation, no behavioral issues  Patient Active Problem List   Diagnosis  Date Noted  . Acute pharyngitis 02/26/2015  . Osteoarthritis of right knee 02/22/2015  . Pain in joint, lower leg 10/31/2014  . Decubitus ulcer of sacral area 04/15/2014  . Moderate aortic stenosis 04/14/2014  . Pre-syncope 04/14/2014  . Hypotension 04/14/2014  . Acute on chronic renal failure ([place]) 04/14/2014  . Anemia, iron deficiency 04/14/2014  . Protein-calorie malnutrition, severe ([place]) 04/14/2014  . Altered mental status   . Dehydration 04/13/2014  . Altered mental state 04/13/2014  . Acute kidney injury ([place])   . Hypokalemia 03/25/2014  . Syncope 03/16/2014  . Acute encephalopathy 03/16/2014  . Leg swelling 03/02/2014  . Avascular necrosis of bone of right hip ([place]) 09/27/2013  . Primary osteoarthritis of right hip 09/07/2013  . Spinal stenosis, lumbar region, with neurogenic claudication 09/07/2013  . Closed fracture of pubic ramus ([place]) 09/07/2013  . BPPV (benign paroxysmal positional vertigo) 09/07/2013  . Pharyngeal swelling 08/30/2013  . Pelvic fracture ([place]) 08/28/2013  . Fall 08/26/2013  . Dizziness 08/26/2013  . Abnormal stress test 08/26/2013  . Preop cardiovascular exam 08/01/2013  . Aortic stenosis 08/01/2013  . Edema 06/30/2013  . Loss of weight 01/04/2013  . Situational stress 01/04/2013  . Chronic right hip pain 07/05/2012  . Knee pain, acute 06/29/2012  . Vertigo 06/24/2012  . Acute exacerbation of chronic low back pain 06/24/2012  . Lumbar pain with radiation down right leg 06/09/2012  . Osteoarthritis of hip 04/29/2012  . History of TIAs 04/17/2012  . S/P total hysterectomy and bilateral salpingo-oophorectomy 04/17/2012  . Hard of hearing 04/17/2012  . History of anemia 04/17/2012  . RLS (restless legs syndrome) 04/17/2012  . Chronic back pain 04/17/2012  . Spinal stenosis, lumbar 04/17/2012  . Lower back pain 12/20/2011  . Personal history of colonic polyps 10/08/2011  . H/O: CVA (cardiovascular accident) 09/14/2011  . Hypertension  09/14/2011  . Restless legs syndrome 09/14/2011  . Constipation 08/20/2011  . Urinary incontinence 08/20/2011   Labs.    02/20/2015.  WBC 6.7 hemoglobin 10.3 platelets 243.  Sodium 140 potassium 4 BUN 37 creatinine 1.0  02/02/2015.  Sodium 140 potassium 4.4 BUN 40 creatinine 0.9.    11/01/2014.  WBC 6.4 hemoglobin 10.4 platelets 282.  Sodium 136 potassium 3.8 BUN 23 creatinine 0.8.  Albumin 3.4 otherwise liver function tests within normal limits.  TSH-2.90 CBC    Component Value Date/Time   WBC 7.6 09/06/2014 2058   RBC 3.51* 09/06/2014 2058   RBC 3.42* 06/28/2014 1205   HGB 9.7* 09/06/2014 2058   HCT 30.5* 09/06/2014 2058   PLT 236 09/06/2014 2058   MCV 86.9 09/06/2014 2058   LYMPHSABS 1.1 06/28/2014 1205   MONOABS 0.4 06/28/2014 1205   EOSABS 0.0 06/28/2014 1205   BASOSABS 0.0 06/28/2014 1205    CMP     Component Value Date/Time   NA 136 09/06/2014 2142   K 4.2 09/06/2014 2142   CL 99 09/06/2014 2142   CO2 27 09/06/2014 2142   GLUCOSE 119* 09/06/2014 2142   BUN 39* 09/06/2014 2142   CREATININE 1.00 02/16/2015 0932   CREATININE 0.78 06/28/2014 1205   CALCIUM 8.2* 09/06/2014 2142   PROT 5.4* 06/28/2014 1205   ALBUMIN 3.0* 06/28/2014 1205   AST 14 06/28/2014 1205   ALT <8 06/28/2014 1205   ALKPHOS 94 06/28/2014 1205   BILITOT 0.5 06/28/2014 1205   GFRNONAA 70* 09/06/2014 2142   GFRAA 81* 09/06/2014 2142    Assessment and Plan      #1 --sore throat-at this point monitor with vital signs for 48 hours every shift-also will start Chloraseptic spray when necessary for 5 days-monitor for any changes   #2-x-ray showing possible lateral condyle fracture on the right nondisplaced- Clinically she appears stable-a fixed site x-ray has been ordered have encouraged her to be nonweightbearing although again this continues to be a challenge-nonetheless she appears to be relatively stable and at baseline      t   Reta Norgren C,

## 2015-03-01 ENCOUNTER — Other Ambulatory Visit: Payer: Self-pay | Admitting: Internal Medicine

## 2015-03-01 ENCOUNTER — Ambulatory Visit (HOSPITAL_COMMUNITY)
Admission: RE | Admit: 2015-03-01 | Discharge: 2015-03-01 | Disposition: A | Discharge status: Home / Self Care | Payer: Medicare Other | Source: Ambulatory Visit | Attending: Internal Medicine | Admitting: Internal Medicine

## 2015-03-01 DIAGNOSIS — M179 Osteoarthritis of knee, unspecified: Secondary | ICD-10-CM | POA: Insufficient documentation

## 2015-03-01 DIAGNOSIS — I739 Peripheral vascular disease, unspecified: Secondary | ICD-10-CM | POA: Insufficient documentation

## 2015-03-01 DIAGNOSIS — T148XXA Other injury of unspecified body region, initial encounter: Secondary | ICD-10-CM

## 2015-03-01 DIAGNOSIS — M25561 Pain in right knee: Secondary | ICD-10-CM | POA: Diagnosis not present

## 2015-03-09 ENCOUNTER — Encounter: Payer: Self-pay | Admitting: Gastroenterology

## 2015-03-16 ENCOUNTER — Ambulatory Visit: Payer: Medicare Other | Admitting: Gastroenterology

## 2015-03-28 ENCOUNTER — Ambulatory Visit (INDEPENDENT_AMBULATORY_CARE_PROVIDER_SITE_OTHER): Payer: Medicare Other | Admitting: Gastroenterology

## 2015-03-28 ENCOUNTER — Encounter: Payer: Self-pay | Admitting: Gastroenterology

## 2015-03-28 ENCOUNTER — Other Ambulatory Visit (INDEPENDENT_AMBULATORY_CARE_PROVIDER_SITE_OTHER): Payer: Medicare Other

## 2015-03-28 ENCOUNTER — Telehealth: Payer: Self-pay | Admitting: Gastroenterology

## 2015-03-28 VITALS — BP 106/40 | HR 84

## 2015-03-28 DIAGNOSIS — R197 Diarrhea, unspecified: Secondary | ICD-10-CM | POA: Diagnosis not present

## 2015-03-28 DIAGNOSIS — R634 Abnormal weight loss: Secondary | ICD-10-CM

## 2015-03-28 LAB — CBC WITH DIFFERENTIAL/PLATELET
Basophils Absolute: 0 10*3/uL (ref 0.0–0.1)
Basophils Relative: 0.7 % (ref 0.0–3.0)
EOS PCT: 1 % (ref 0.0–5.0)
Eosinophils Absolute: 0.1 10*3/uL (ref 0.0–0.7)
HCT: 32.5 % — ABNORMAL LOW (ref 36.0–46.0)
Hemoglobin: 10.4 g/dL — ABNORMAL LOW (ref 12.0–15.0)
LYMPHS ABS: 1.5 10*3/uL (ref 0.7–4.0)
Lymphocytes Relative: 25.5 % (ref 12.0–46.0)
MCHC: 32 g/dL (ref 30.0–36.0)
MCV: 85 fl (ref 78.0–100.0)
MONO ABS: 0.4 10*3/uL (ref 0.1–1.0)
Monocytes Relative: 7.1 % (ref 3.0–12.0)
NEUTROS PCT: 65.7 % (ref 43.0–77.0)
Neutro Abs: 3.9 10*3/uL (ref 1.4–7.7)
Platelets: 250 10*3/uL (ref 150.0–400.0)
RBC: 3.82 Mil/uL — AB (ref 3.87–5.11)
RDW: 15.3 % (ref 11.5–15.5)
WBC: 6 10*3/uL (ref 4.0–10.5)

## 2015-03-28 LAB — BASIC METABOLIC PANEL
BUN: 35 mg/dL — ABNORMAL HIGH (ref 6–23)
CALCIUM: 9 mg/dL (ref 8.4–10.5)
CO2: 36 meq/L — AB (ref 19–32)
Chloride: 101 mEq/L (ref 96–112)
Creatinine, Ser: 0.88 mg/dL (ref 0.40–1.20)
GFR: 63.49 mL/min (ref >60.00)
Glucose, Bld: 87 mg/dL (ref 70–99)
Potassium: 4.2 mEq/L (ref 3.5–5.1)
SODIUM: 142 meq/L (ref 135–145)

## 2015-03-28 LAB — IGA: IGA: 126 mg/dL (ref 68–378)

## 2015-03-28 NOTE — Patient Instructions (Addendum)
You have been scheduled for a CT scan of the abdomen and pelvis at [facility] ([address]---this is in the same building as [facility]).   You are scheduled on 04-02-2015 at 1:30pm. You should arrive 15 minutes prior to your appointment time for registration. Please follow the written instructions below on the day of your exam:  WARNING: IF YOU ARE ALLERGIC TO IODINE/X-RAY DYE, PLEASE NOTIFY RADIOLOGY IMMEDIATELY AT [phone]! YOU WILL BE GIVEN A 13 HOUR PREMEDICATION PREP.  1) Do not eat or drink anything after 9:30am (4 hours prior to your test) 2) You have been given 2 bottles of oral contrast to drink. The solution may taste better if refrigerated, but do NOT add ice or any other liquid to this solution. Shake well before drinking.    Drink 1 bottle of contrast @ 11:30am (2 hours prior to your exam)  Drink 1 bottle of contrast @ 12:30am (1 hour prior to your exam)  You may take any medications as prescribed with a small amount of water except for the following: Metformin, Glucophage, Glucovance, Avandamet, Riomet, Fortamet, Actoplus Met, Janumet, Glumetza or Metaglip. The above medications must be held the day of the exam AND 48 hours after the exam.  The purpose of you drinking the oral contrast is to aid in the visualization of your intestinal tract. The contrast solution may cause some diarrhea. Before your exam is started, you will be given a small amount of fluid to drink. Depending on your individual set of symptoms, you may also receive an intravenous injection of x-ray contrast/dye. Plan on being at [facility] for 30 minutes or long, depending on the type of exam you are having performed.  This test typically takes 30-45 minutes to complete.  If you have any questions regarding your exam or if you need to reschedule, you may call the CT department at [phone] between the hours of 8:00 am and 5:00 pm,  Monday-Friday.  ________________________________________________________________________    Your physician has requested that you go to the basement for lab work before leaving today.   Please STOP the Miralax and Senokot.

## 2015-03-29 LAB — TISSUE TRANSGLUTAMINASE, IGA: Tissue Transglutaminase Ab, IgA: 1 U/mL (ref <4)

## 2015-03-29 NOTE — Telephone Encounter (Signed)
Spoke to Shelly Martin at [facility]. Questions regarding her stool studies were answered. They are going to order labs there as they have no way to have the stool specimen back to out lab in the allowed time. Once results are in they will fax [place] a copy.

## 2015-03-30 ENCOUNTER — Encounter: Payer: Self-pay | Admitting: Gastroenterology

## 2015-03-30 ENCOUNTER — Encounter: Payer: Self-pay | Admitting: *Deleted

## 2015-03-30 DIAGNOSIS — R197 Diarrhea, unspecified: Secondary | ICD-10-CM | POA: Insufficient documentation

## 2015-03-30 NOTE — Progress Notes (Signed)
03/28/2015 Shelly Martin [id number] Apr 24, 1919   HISTORY OF PRESENT ILLNESS:  This is a 81 year old female who is previously known to Dr. Deatra Ina. Her last colonoscopy was in April 2013 at which time she was found have one polyp removed from the cecum that was a sessile serrated adenoma.  She is here by herself from [facility] living facility. Referred here by her PCP, Dr. [name].  She complains of diarrhea usually throughout the day, however, she did have an episode at night on one occasion. Also has some intermittent nausea and abdominal cramping, particularly in her upper abdomen.  Denies seeing any blood in her stool. She says that she has lost 50 pounds within the past year. She is determined to find the cause of her symptoms and is willing to undergo whatever test we deem necessary to investigate these issues. She tells me that she thinks she has a worm that is causing her to have diarrhea and lose weight.    We are unclear on what medication she is actually receiving at the facility, however, it appears that she is at least getting MiraLAX every other day despite her complaints of diarrhea. She also has Senokot, magnesium, and dulcolax on the list as well but does not appear that she has received these recently.   Past Medical History  Diagnosis Date  . Hypertension   . Restless legs syndrome     on Requip  . Ulcer causing bleeding and hole in wall of stomach or small intestine   . Gastric ulcer     years ago  . Arthritis     Back, knees  . Constipation   . Urinary bladder calculus   . Urinary incontinence   . Bowel incontinence   . Aortic stenosis   . Stroke ([place])     Mini, no residual  . TIA (transient ischemic attack)   . Colon polyps   . Anemia   . Spinal stenosis   . Vertigo   . Situational stress   . Acute encephalopathy   . Hypokalemia   . AKI (acute kidney injury) ([place])   . Altered mental state    Past Surgical History  Procedure Laterality Date  .  Colon resection  50 years ago  . Tonsillectomy    . Uterine fibroid surgery    . Lumbar laminectomy/decompression microdiscectomy  06/25/2011    Procedure: LUMBAR LAMINECTOMY/DECOMPRESSION MICRODISCECTOMY;  Surgeon: Hosie Spangle, MD;  Location: [facility];  Service: Neurosurgery;  Laterality: Right;  RIGHT Lumbar Two-Three hemilaminectomy and microdiskectomy  . Vaginal hysterectomy      reports that she has never smoked. She has never used smokeless tobacco. She reports that she drinks alcohol. She reports that she does not use illicit drugs. family history includes Dementia in her mother; Pancreatic cancer in her brother; Stroke in her father. There is no history of Anesthesia problems, Colon cancer, Esophageal cancer, Stomach cancer, Diabetes, Kidney disease, or Liver disease. Allergies  Allergen Reactions  . Amoxicillin Other (See Comments)    Reaction unknown  . Baclofen Other (See Comments)    "fuzzy in the eyes" and dizzy  . Hctz [Hydrochlorothiazide]     nausea  . Valium [Diazepam] Other (See Comments)    Pt became unresponsive and O2 sats dropped.      Outpatient Encounter Prescriptions as of 03/28/2015  Medication Sig  . polyethylene glycol (MIRALAX / GLYCOLAX) packet Take 17 g by mouth every other day.   . Amino Acids-Protein  Hydrolys (FEEDING SUPPLEMENT, PRO-STAT SUGAR FREE 64,) LIQD Take 30 mLs by mouth daily.  Marland Kitchen docusate sodium (COLACE) 100 MG capsule Take 100 mg by mouth 2 (two) times daily.  . furosemide (LASIX) 20 MG tablet Take 20 mg by mouth daily.  Marland Kitchen HYDROmorphone (DILAUDID) 4 MG tablet Take 2 mg by mouth 2 (two) times daily as needed for moderate pain or severe pain.   Marland Kitchen loperamide (IMODIUM A-D) 2 MG tablet Take 4 mg by mouth 4 (four) times daily as needed for diarrhea or loose stools.  . magnesium hydroxide (MILK OF MAGNESIA) 400 MG/5ML suspension Take 30 mLs by mouth daily as needed for mild constipation.  . mirtazapine (REMERON) 15 MG tablet Take 7.5 mg by  mouth at bedtime.  . Multiple Vitamin (MULTIVITAMIN) tablet Take 1 tablet by mouth daily.  . Nutritional Supplements (ENSURE PO) Take 237 mLs by mouth 2 (two) times daily.  . Nutritional Supplements (JUVEN) POWD Take 4 oz by mouth 2 (two) times daily.  . potassium chloride (K-DUR) 10 MEQ tablet Take 20 mEq by mouth daily.   . promethazine (PHENERGAN) 25 MG tablet Take 25 mg by mouth every 6 (six) hours as needed for nausea or vomiting.  Marland Kitchen rOPINIRole (REQUIP) 2 MG tablet TAKE 1-2 TABLETS BY MOUTH TWICE DAILY AS NEEDED (RESTLESS LEG). (Patient not taking: Reported on 09/06/2014)  . sennosides-docusate sodium (SENOKOT-S) 8.6-50 MG tablet Take 2 tablets by mouth at bedtime.  . [DISCONTINUED] amLODipine (NORVASC) 5 MG tablet Take 5 mg by mouth 2 (two) times daily. For HTN  . [DISCONTINUED] fesoterodine (TOVIAZ) 8 MG TB24 tablet Take 8 mg by mouth daily.  . [DISCONTINUED] gabapentin (NEURONTIN) 300 MG capsule Take 1 capsule (300 mg total) by mouth 3 (three) times daily. Reduce your dose to 300 mg 3 x a day. (Patient not taking: Reported on 09/06/2014)  . [DISCONTINUED] lactose free nutrition (BOOST PLUS) LIQD Take 237 mLs by mouth 2 (two) times daily between meals. (Patient not taking: Reported on 09/06/2014)  . [DISCONTINUED] morphine (MSIR) 15 MG tablet Take 1 tablet (15 mg total) by mouth every 8 (eight) hours. (Patient taking differently: Take 15 mg by mouth 2 (two) times daily. One tab po Q 12 hours)  . [DISCONTINUED] rOPINIRole (REQUIP) 2 MG tablet Take 4 mg by mouth at bedtime.    No facility-administered encounter medications on file as of 03/28/2015.     REVIEW OF SYSTEMS  : All other systems reviewed and negative except where noted in the History of Present Illness.   PHYSICAL EXAM: BP 106/40 mmHg  Pulse 84  Ht   Wt  General: Thin elderly white female in no acute distress; in wheelchair Head: Normocephalic and atraumatic Eyes:  Sclerae anicteric, conjunctiva pink. Ears: Normal auditory  acuity Lungs: Clear throughout to auscultation Heart: Regular rate and rhythm Abdomen: Soft, non-distended.  Normal bowel sounds.  Non-tender. Musculoskeletal: Symmetrical with no gross deformities; decreased mobility on the right due to arthritis  Skin: No lesions on visible extremities Extremities: No edema  Neurological: Alert oriented x 4, grossly non-focal Psychological:  Alert and cooperative. Normal mood and affect  ASSESSMENT AND PLAN: -Weight loss:  50 pounds in one year.  Will order CT scan abdomen and pelvis to rule out malignancy and other causes of diarrhea and weight loss. -Diarrhea:  Unsure of the cause of her diarrhea at this point.  Will check stool studies for now including GI pathogen panel and ova/parasites. We'll also check labs including CBC, BMP, celiac labs.  *  We are unclear on what medication she is actually receiving at the facility, however, it appears that she is at least getting MiraLAX every other day despite her complaints of diarrhea. She also has Senokot, magnesium, and dulcolax on the list as well but does not appear that she has received these recently.  I have asked that they hold the Miralax and any other laxative medications for now.  **If any procedures are necessary then they will likely need to be done at [facility] due to patient immobility due to arthritis.  CC:  Dr. Serita Sheller

## 2015-03-30 NOTE — Progress Notes (Signed)
Reviewed and agree with documentation and assessment and plan. Will try to manage conservatively and hold off endoscopic evaluation K. Denzil Magnuson , MD

## 2015-04-02 ENCOUNTER — Ambulatory Visit (INDEPENDENT_AMBULATORY_CARE_PROVIDER_SITE_OTHER)
Admission: RE | Admit: 2015-04-02 | Discharge: 2015-04-02 | Disposition: A | Discharge status: Home / Self Care | Payer: Medicare Other | Source: Ambulatory Visit | Attending: Gastroenterology | Admitting: Gastroenterology

## 2015-04-02 DIAGNOSIS — R197 Diarrhea, unspecified: Secondary | ICD-10-CM

## 2015-04-02 DIAGNOSIS — R634 Abnormal weight loss: Secondary | ICD-10-CM | POA: Diagnosis not present

## 2015-04-02 MED ORDER — IOHEXOL 300 MG/ML  SOLN
100.0000 mL | Freq: Once | INTRAMUSCULAR | Status: AC | PRN
  Administered 2015-04-02: 80 mL via INTRAVENOUS

## 2015-04-23 ENCOUNTER — Other Ambulatory Visit: Payer: Self-pay | Admitting: *Deleted

## 2015-04-23 DIAGNOSIS — R109 Unspecified abdominal pain: Secondary | ICD-10-CM

## 2015-04-23 MED ORDER — MORPHINE SULFATE ER 15 MG PO TBCR: EXTENDED_RELEASE_TABLET | ORAL | Status: DC

## 2015-04-23 NOTE — Telephone Encounter (Signed)
[facility] 

## 2015-04-24 ENCOUNTER — Other Ambulatory Visit: Payer: Self-pay | Admitting: *Deleted

## 2015-04-24 MED ORDER — MORPHINE SULFATE ER 15 MG PO TBCR: EXTENDED_RELEASE_TABLET | ORAL | Status: DC

## 2015-04-24 NOTE — Telephone Encounter (Signed)
[facility] 

## 2015-04-27 LAB — BASIC METABOLIC PANEL: Glucose: 81 mg/dL

## 2015-05-01 ENCOUNTER — Telehealth: Payer: Self-pay | Admitting: *Deleted

## 2015-05-01 NOTE — Telephone Encounter (Signed)
Spoke with [facility] and [phone]). They are not sure if stool study has been done. Refaxed order to [phone].

## 2015-05-14 LAB — BASIC METABOLIC PANEL
BUN: 38 mg/dL — AB (ref 4–21)
Creatinine: 0.9 mg/dL (ref <=1.1)
POTASSIUM: 4.2 mmol/L (ref 3.4–5.3)
Sodium: 120 mmol/L — AB (ref 137–147)

## 2015-05-14 LAB — CBC AND DIFFERENTIAL
HEMATOCRIT: 28 % — AB (ref 36–46)
HEMOGLOBIN: 9.2 g/dL — AB (ref 12.0–16.0)
PLATELETS: 207 10*3/uL (ref 150–399)
WBC: 6.3 10*3/mL

## 2015-05-15 ENCOUNTER — Encounter: Payer: Self-pay | Admitting: Internal Medicine

## 2015-05-15 ENCOUNTER — Non-Acute Institutional Stay (SKILLED_NURSING_FACILITY): Payer: Medicare Other | Admitting: Internal Medicine

## 2015-05-15 DIAGNOSIS — F1120 Opioid dependence, uncomplicated: Secondary | ICD-10-CM | POA: Diagnosis not present

## 2015-05-15 DIAGNOSIS — I10 Essential (primary) hypertension: Secondary | ICD-10-CM

## 2015-05-15 DIAGNOSIS — L89154 Pressure ulcer of sacral region, stage 4: Secondary | ICD-10-CM

## 2015-05-15 DIAGNOSIS — L98429 Non-pressure chronic ulcer of back with unspecified severity: Secondary | ICD-10-CM

## 2015-05-15 NOTE — Assessment & Plan Note (Addendum)
Pt is, and has been , on huge doses narcotics, morphine and dilaudid,  scheduled for pain since before she came to SNF ; we have tried to decrease amount with small success. In all likely hood pt is addicted;however at her age and with some dementia there will be no way to ever get her off these meds

## 2015-05-15 NOTE — Assessment & Plan Note (Signed)
Pt came to SNF for wound care to heal this ulcer; ulcer has improved, however still far from being healed;plan - cont current wound care

## 2015-05-15 NOTE — Assessment & Plan Note (Signed)
Pt 's BP a little high today, on no meds other than lasix 20 mg; will put on norvasc 5 mg daily

## 2015-05-15 NOTE — Progress Notes (Signed)
MRN: [id number] Name: Shelly Martin  Sex: female Age: 81 y.o. DOB: 1918-10-30  [place] #: [name] Facility/Room: Level Of Care: SNF Provider: Inocencio Homes D Emergency Contacts: Extended Emergency Contact Information Primary Emergency Contact: [facility] Address: [place], [place] [address] [place] Phone: [phone] Relation: Daughter Secondary Emergency Contact: [place], [place] of [place] Phone: [phone] Relation: Son  Code Status:   Allergies: Amoxicillin; Baclofen; Hctz; and Valium  Chief Complaint  Patient presents with  . Medical Management of Chronic Issues    HPI: Patient is 81 y.o. female who was admitted form her assisted living facility in 08/2014  for wound care of a stage 4 decubitus ulcer who is being seen for routine issues of HTN, sacral ulcer stage 4 and narcotic addiction.  Past Medical History  Diagnosis Date  . Hypertension   . Restless legs syndrome     on Requip  . Ulcer causing bleeding and hole in wall of stomach or small intestine   . Gastric ulcer     years ago  . Arthritis     Back, knees  . Constipation   . Urinary bladder calculus   . Urinary incontinence   . Bowel incontinence   . Aortic stenosis   . Stroke ([place])     Mini, no residual  . TIA (transient ischemic attack)   . Colon polyps   . Anemia   . Spinal stenosis   . Vertigo   . Situational stress   . Acute encephalopathy   . Hypokalemia   . AKI (acute kidney injury) ([place])   . Altered mental state     Past Surgical History  Procedure Laterality Date  . Colon resection  50 years ago  . Tonsillectomy    . Uterine fibroid surgery    . Lumbar laminectomy/decompression microdiscectomy  06/25/2011    Procedure: LUMBAR LAMINECTOMY/DECOMPRESSION MICRODISCECTOMY;  Surgeon: Hosie Spangle, MD;  Location: [facility];  Service: Neurosurgery;  Laterality: Right;  RIGHT Lumbar  Two-Three hemilaminectomy and microdiskectomy  . Vaginal hysterectomy        Medication List       This list is accurate as of: 05/15/15 11:59 PM.  Always use your most recent med list.               docusate sodium 100 MG capsule  Commonly known as:  COLACE  Take 100 mg by mouth 2 (two) times daily.     feeding supplement (PRO-STAT SUGAR FREE 64) Liqd  Take 30 mLs by mouth daily.     furosemide 20 MG tablet  Commonly known as:  LASIX  Take 20 mg by mouth daily.     HYDROmorphone 4 MG tablet  Commonly known as:  DILAUDID  Take 2 mg by mouth 2 (two) times daily as needed for moderate pain or severe pain.     JUVEN Powd  Take 4 oz by mouth 2 (two) times daily.     ENSURE PO  Take 237 mLs by mouth 2 (two) times daily.     loperamide 2 MG tablet  Commonly known as:  IMODIUM A-D  Take 4 mg by mouth 4 (four) times daily as needed for diarrhea or loose stools.     magnesium hydroxide 400 MG/5ML suspension  Commonly known as:  MILK OF MAGNESIA  Take 30 mLs by  mouth daily as needed for mild constipation.     mirtazapine 15 MG tablet  Commonly known as:  REMERON  Take 7.5 mg by mouth at bedtime.     morphine 15 MG 12 hr tablet  Commonly known as:  MS CONTIN  Take one tablet by mouth twice daily for pain. Do not crush     multivitamin tablet  Take 1 tablet by mouth daily.     polyethylene glycol packet  Commonly known as:  MIRALAX / GLYCOLAX  Take 17 g by mouth every other day.     potassium chloride 10 MEQ tablet  Commonly known as:  K-DUR  Take 20 mEq by mouth daily.     promethazine 25 MG tablet  Commonly known as:  PHENERGAN  Take 25 mg by mouth every 6 (six) hours as needed for nausea or vomiting.     rOPINIRole 2 MG tablet  Commonly known as:  REQUIP  TAKE 1-2 TABLETS BY MOUTH TWICE DAILY AS NEEDED (RESTLESS LEG).     sennosides-docusate sodium 8.6-50 MG tablet  Commonly known as:  SENOKOT-S  Take 2 tablets by mouth at bedtime.        No orders  of the defined types were placed in this encounter.    Immunization History  Administered Date(s) Administered  . Influenza Split 04/15/2012  . Influenza,inj,Quad PF,36+ Mos 03/06/2014  . PPD Test 08/30/2014  . Pneumococcal Polysaccharide-23 08/29/2013    Social History  Substance Use Topics  . Smoking status: Never Smoker   . Smokeless tobacco: Never Used  . Alcohol Use: 0.0 oz/week    0 Standard drinks or equivalent per week     Comment: rarely    Review of Systems  DATA OBTAINED: from patient, nurse GENERAL:  no fevers, fatigue, appetite changes SKIN: No itching, rash HEENT: No complaint RESPIRATORY: No cough, wheezing, SOB CARDIAC: No chest pain, palpitations, lower extremity edema  GI: No abdominal pain, No N/V/D or constipation, No heartburn or reflux  GU: No dysuria, frequency or urgency, or incontinence  MUSCULOSKELETAL: No unrelieved bone/joint pain NEUROLOGIC: No headache, dizziness  PSYCHIATRIC: No overt anxiety or sadness  Filed Vitals:   05/15/15 1512  BP: 155/73  Pulse: 80  Temp: 97.2 F (36.2 C)  Resp: 16    Physical Exam  GENERAL APPEARANCE: Alert, conversant, No acute distress  SKIN: No diaphoresis rash; wound dressed HEENT: Unremarkable RESPIRATORY: Breathing is even, unlabored. Lung sounds are clear   CARDIOVASCULAR: Heart RRR no murmurs, rubs or gallops. No peripheral edema  GASTROINTESTINAL: Abdomen is soft, non-tender, not distended w/ normal bowel sounds.  GENITOURINARY: Bladder non tender, not distended  MUSCULOSKELETAL: No abnormal joints or musculature NEUROLOGIC: Cranial nerves 2-12 grossly intact. Moves all extremities PSYCHIATRIC: Mood and affect appropriate to situation, no behavioral issues  Patient Active Problem List   Diagnosis Date Noted  . Narcotic addiction ([place]) 05/15/2015  . Diarrhea 03/30/2015  . Acute pharyngitis 02/26/2015  . Osteoarthritis of right knee 02/22/2015  . Pain in joint, lower leg 10/31/2014  . Ulcer  of sacral region, stage 4 ([place]) 04/15/2014  . Moderate aortic stenosis 04/14/2014  . Pre-syncope 04/14/2014  . Hypotension 04/14/2014  . Acute on chronic renal failure ([place]) 04/14/2014  . Anemia, iron deficiency 04/14/2014  . Protein-calorie malnutrition, severe ([place]) 04/14/2014  . Altered mental status   . Dehydration 04/13/2014  . Altered mental state 04/13/2014  . Acute kidney injury ([place])   . Hypokalemia 03/25/2014  . Syncope 03/16/2014  . Acute  encephalopathy 03/16/2014  . Leg swelling 03/02/2014  . Avascular necrosis of bone of right hip ([place]) 09/27/2013  . Primary osteoarthritis of right hip 09/07/2013  . Spinal stenosis, lumbar region, with neurogenic claudication 09/07/2013  . Closed fracture of pubic ramus ([place]) 09/07/2013  . BPPV (benign paroxysmal positional vertigo) 09/07/2013  . Pharyngeal swelling 08/30/2013  . Pelvic fracture ([place]) 08/28/2013  . Fall 08/26/2013  . Dizziness 08/26/2013  . Abnormal stress test 08/26/2013  . Preop cardiovascular exam 08/01/2013  . Aortic stenosis 08/01/2013  . Edema 06/30/2013  . Loss of weight 01/04/2013  . Situational stress 01/04/2013  . Chronic right hip pain 07/05/2012  . Knee pain, acute 06/29/2012  . Vertigo 06/24/2012  . Acute exacerbation of chronic low back pain 06/24/2012  . Lumbar pain with radiation down right leg 06/09/2012  . Osteoarthritis of hip 04/29/2012  . History of TIAs 04/17/2012  . S/P total hysterectomy and bilateral salpingo-oophorectomy 04/17/2012  . Hard of hearing 04/17/2012  . History of anemia 04/17/2012  . RLS (restless legs syndrome) 04/17/2012  . Chronic back pain 04/17/2012  . Spinal stenosis, lumbar 04/17/2012  . Lower back pain 12/20/2011  . Personal history of colonic polyps 10/08/2011  . H/O: CVA (cardiovascular accident) 09/14/2011  . Hypertension 09/14/2011  . Restless legs syndrome 09/14/2011  . Constipation 08/20/2011  . Urinary incontinence 08/20/2011    CBC    Component  Value Date/Time   WBC 6.0 03/28/2015 1031   RBC 3.82* 03/28/2015 1031   RBC 3.42* 06/28/2014 1205   HGB 10.4* 03/28/2015 1031   HCT 32.5* 03/28/2015 1031   PLT 250.0 03/28/2015 1031   MCV 85.0 03/28/2015 1031   LYMPHSABS 1.5 03/28/2015 1031   MONOABS 0.4 03/28/2015 1031   EOSABS 0.1 03/28/2015 1031   BASOSABS 0.0 03/28/2015 1031    CMP     Component Value Date/Time   NA 142 03/28/2015 1031   K 4.2 03/28/2015 1031   CL 101 03/28/2015 1031   CO2 36* 03/28/2015 1031   GLUCOSE 87 03/28/2015 1031   BUN 35* 03/28/2015 1031   CREATININE 0.88 03/28/2015 1031   CREATININE 0.78 06/28/2014 1205   CALCIUM 9.0 03/28/2015 1031   PROT 5.4* 06/28/2014 1205   ALBUMIN 3.0* 06/28/2014 1205   AST 14 06/28/2014 1205   ALT <8 06/28/2014 1205   ALKPHOS 94 06/28/2014 1205   BILITOT 0.5 06/28/2014 1205   GFRNONAA 70* 09/06/2014 2142   GFRAA 81* 09/06/2014 2142    Assessment and Plan  Hypertension Pt 's BP a little high today, on no meds other than lasix 20 mg; will put on norvasc 5 mg daily  Ulcer of sacral region, stage 4 ([facility]) Pt came to SNF for wound care to heal this ulcer; ulcer has improved, however still far from being healed;plan - cont current wound care  Narcotic addiction ([place]) Pt is, and has been , on huge doses narcotics, morphine and dilaudid,  scheduled for pain ; we have tried to decrease amount with small success. In all likely hood is addicted;however at her age and with dementia there will be no way to ever get her off these meds    [name], MD

## 2015-05-30 ENCOUNTER — Telehealth: Payer: Self-pay | Admitting: *Deleted

## 2015-05-30 NOTE — Telephone Encounter (Signed)
Called and left a message for patient's  Nurse to call me re: Pancreatic elastase results.

## 2015-05-31 NOTE — Telephone Encounter (Signed)
Left a message for patient's nurse to call me.

## 2015-06-07 NOTE — Telephone Encounter (Signed)
Spoke with Shelly Martin in medical records at [facility]. She states she will work on getting the result and call/fax me.

## 2015-06-08 NOTE — Telephone Encounter (Signed)
Spoke with Bridgett Larsson the nurse at [facility] and she states the Pancreatic elastase has not been done. She took the order and states they will fax results to [place].

## 2015-07-03 ENCOUNTER — Telehealth: Payer: Self-pay | Admitting: Gastroenterology

## 2015-07-03 NOTE — Telephone Encounter (Signed)
Finally received fecal elastase results.  They were normal at 396.

## 2015-07-23 ENCOUNTER — Other Ambulatory Visit: Payer: Self-pay | Admitting: *Deleted

## 2015-07-23 MED ORDER — MORPHINE SULFATE ER 15 MG PO TBCR: EXTENDED_RELEASE_TABLET | ORAL | Status: DC

## 2015-07-23 NOTE — Telephone Encounter (Signed)
[facility] 

## 2015-07-27 ENCOUNTER — Non-Acute Institutional Stay (SKILLED_NURSING_FACILITY): Payer: Medicare Other | Admitting: Internal Medicine

## 2015-07-27 ENCOUNTER — Encounter: Payer: Self-pay | Admitting: Internal Medicine

## 2015-07-27 DIAGNOSIS — M25551 Pain in right hip: Secondary | ICD-10-CM | POA: Diagnosis not present

## 2015-07-27 DIAGNOSIS — G8929 Other chronic pain: Secondary | ICD-10-CM

## 2015-07-27 DIAGNOSIS — K5901 Slow transit constipation: Secondary | ICD-10-CM | POA: Diagnosis not present

## 2015-07-27 DIAGNOSIS — G2581 Restless legs syndrome: Secondary | ICD-10-CM

## 2015-07-27 NOTE — Progress Notes (Deleted)
Location:  [facility] Room Number: [place] of Service:  SNF [phone]) Provider: Beverlyn Roux, MD  Patient Care Team: Lanice Shirts, MD as PCP - General (Internal Medicine) [name], MD as Consulting Physician (Orthopedic Surgery) [name], MD as Consulting Physician (Otolaryngology)  Extended Emergency Contact Information Primary Emergency Contact: [name] Address: [place], [place] [address] [name] of [place] Phone: [phone] Relation: Daughter Secondary Emergency Contact: [place], [place] of [place] Mobile Phone: [phone] Relation: Son  Code Status: DNR Goals of care: Advanced Directive information Advanced Directives 07/27/2015  Does patient have an advance directive? Yes  Type of Advance Directive Out of facility DNR (pink MOST or yellow form)  Does patient want to make changes to advanced directive? No - Patient declined  Copy of advanced directive(s) in chart? No - copy requested     Chief Complaint  Patient presents with  . Medical Management of Chronic Issues    HPI:  Pt is a 82 y.o. female seen today for medical management of chronic diseases.     Past Medical History  Diagnosis Date  . Hypertension   . Restless legs syndrome     on Requip  . Ulcer causing bleeding and hole in wall of stomach or small intestine   . Gastric ulcer     years ago  . Arthritis     Back, knees  . Constipation   . Urinary bladder calculus   . Urinary incontinence   . Bowel incontinence   . Aortic stenosis   . Stroke ([place])     Mini, no residual  . TIA (transient ischemic attack)   . Colon polyps   . Anemia   . Spinal stenosis   . Vertigo   . Situational stress   . Acute encephalopathy   . Hypokalemia   . AKI (acute kidney injury) ([place])   . Altered mental state    Past Surgical History  Procedure  Laterality Date  . Colon resection  50 years ago  . Tonsillectomy    . Uterine fibroid surgery    . Lumbar laminectomy/decompression microdiscectomy  06/25/2011    Procedure: LUMBAR LAMINECTOMY/DECOMPRESSION MICRODISCECTOMY;  Surgeon: Hosie Spangle, MD;  Location: [facility];  Service: Neurosurgery;  Laterality: Right;  RIGHT Lumbar Two-Three hemilaminectomy and microdiskectomy  . Vaginal hysterectomy      Allergies  Allergen Reactions  . Amoxicillin Other (See Comments)    Reaction unknown  . Baclofen Other (See Comments)    "fuzzy in the eyes" and dizzy  . Hctz [Hydrochlorothiazide]     nausea  . Valium [Diazepam] Other (See Comments)    Pt became unresponsive and O2 sats dropped.      Medication List       This list is accurate as of: 07/27/15  3:36 PM.  Always use your most recent med list.               docusate sodium 100 MG capsule  Commonly known as:  COLACE  Take 100 mg by mouth 2 (two) times daily. Reported on 07/27/2015     furosemide 20 MG tablet  Commonly known as:  LASIX  Take 20 mg by mouth daily.     HYDROmorphone 4 MG tablet  Commonly known as:  DILAUDID  Take 2  mg by mouth 2 (two) times daily as needed for moderate pain or severe pain.     loperamide 2 MG tablet  Commonly known as:  IMODIUM A-D  Take 4 mg by mouth 4 (four) times daily as needed for diarrhea or loose stools. Reported on 07/27/2015     magnesium hydroxide 400 MG/5ML suspension  Commonly known as:  MILK OF MAGNESIA  Take 30 mLs by mouth daily as needed for mild constipation. Reported on 07/27/2015     mirtazapine 15 MG tablet  Commonly known as:  REMERON  Take 7.5 mg by mouth at bedtime.     morphine 15 MG 12 hr tablet  Commonly known as:  MS CONTIN  Take one tablet by mouth twice daily for pain. Do not crush     multivitamin tablet  Take 1 tablet by mouth daily.     polyethylene glycol packet  Commonly known as:  MIRALAX / GLYCOLAX  Take 17 g by mouth every other day.  Reported on 07/27/2015     potassium chloride 10 MEQ tablet  Commonly known as:  K-DUR  Take 20 mEq by mouth daily.     promethazine 25 MG tablet  Commonly known as:  PHENERGAN  Take 25 mg by mouth every 6 (six) hours as needed for nausea or vomiting.     rOPINIRole 2 MG tablet  Commonly known as:  REQUIP  TAKE 1-2 TABLETS BY MOUTH TWICE DAILY AS NEEDED (RESTLESS LEG).     sennosides-docusate sodium 8.6-50 MG tablet  Commonly known as:  SENOKOT-S  Take 2 tablets by mouth at bedtime.        Review of Systems  Immunization History  Administered Date(s) Administered  . Influenza Split 04/15/2012  . Influenza,inj,Quad PF,36+ Mos 03/06/2014  . PPD Test 08/30/2014, 09/18/2014  . Pneumococcal Polysaccharide-23 08/29/2013   Pertinent  Health Maintenance Due  Topic Date Due  . DEXA SCAN  06/09/1985  . PNA vac Low Risk Adult (2 of 2 - PCV13) 08/30/2014  . INFLUENZA VACCINE  12/25/2014   Fall Risk  06/30/2013 01/04/2013  Falls in the past year? Yes No  Risk for fall due to : Impaired balance/gait;Impaired mobility;Medication side effect -   Functional Status Survey:    Filed Vitals:   07/27/15 1505  BP: 155/73  Pulse: 74  Temp: 97.7 F (36.5 C)  TempSrc: Oral  Resp: 18  Height: 5\' 2"  (1.575 m)  Weight: 93 lb 12.8 oz (42.547 kg)   Body mass index is 17.15 kg/(m^2). Physical Exam  Labs reviewed:  Recent Labs  09/06/14 2142 02/16/15 0932 03/28/15 1031 05/14/15  NA 136  --  142 120*  K 4.2  --  4.2 4.2  CL 99  --  101  --   CO2 27  --  36*  --   GLUCOSE 119*  --  87  --   BUN 39*  --  35* 38*  CREATININE 0.76 1.00 0.88 0.9  CALCIUM 8.2*  --  9.0  --    No results for input(s): AST, ALT, ALKPHOS, BILITOT, PROT, ALBUMIN in the last 8760 hours.  Recent Labs  09/06/14 2058 03/28/15 1031 05/14/15  WBC 7.6 6.0 6.3  NEUTROABS  --  3.9  --   HGB 9.7* 10.4* 9.2*  HCT 30.5* 32.5* 28*  MCV 86.9 85.0  --   PLT 236 250.0 207   Lab Results  Component Value Date    TSH 1.438 06/28/2014   Lab Results  Component Value Date   HGBA1C 5.6 06/28/2014   Lab Results  Component Value Date   CHOL 172 03/06/2014   HDL 61 03/06/2014   LDLCALC 88 03/06/2014   TRIG 114 03/06/2014   CHOLHDL 2.8 03/06/2014    Significant Diagnostic Results in last 30 days:  No results found.  Assessment/Plan There are no diagnoses linked to this encounter.   Family/ staff Communication: ***  Labs/tests ordered:  ***

## 2015-07-28 ENCOUNTER — Encounter: Payer: Self-pay | Admitting: Internal Medicine

## 2015-07-28 NOTE — Assessment & Plan Note (Signed)
Pt is without pain; pt on MS contin and scheduled dilaudid ; of course she is addicted but there is nothing to be done in a 82 yo for that

## 2015-07-28 NOTE — Assessment & Plan Note (Signed)
Pt on scheduled colace and senokot and without problems; cont cu=rrent meds

## 2015-07-28 NOTE — Progress Notes (Signed)
MRN: [id number] Name: Shelly Martin  Sex: female Age: 82 y.o. DOB: [birth year]  [place] #: [name] Facility/Room: Level Of Care: SNF Provider: Inocencio Homes D Emergency Contacts: Extended Emergency Contact Information Primary Emergency Contact: [facility] Address: [place], [place] [address] [place] Phone: [phone] Relation: Daughter Secondary Emergency Contact: [place], [place] of [place] Phone: [phone] Relation: Son  Code Status:   Allergies: Amoxicillin; Baclofen; Hctz; and Valium  Chief Complaint  Patient presents with  . Medical Management of Chronic Issues    HPI: Patient is 82 y.o. female who is being seen for routine issues of RLS, chronic R hip pain 2/2 avascular necrosis and constipation.  Past Medical History  Diagnosis Date  . Hypertension   . Restless legs syndrome     on Requip  . Ulcer causing bleeding and hole in wall of stomach or small intestine   . Gastric ulcer     years ago  . Arthritis     Back, knees  . Constipation   . Urinary bladder calculus   . Urinary incontinence   . Bowel incontinence   . Aortic stenosis   . Stroke ([place])     Mini, no residual  . TIA (transient ischemic attack)   . Colon polyps   . Anemia   . Spinal stenosis   . Vertigo   . Situational stress   . Acute encephalopathy   . Hypokalemia   . AKI (acute kidney injury) ([place])   . Altered mental state     Past Surgical History  Procedure Laterality Date  . Colon resection  50 years ago  . Tonsillectomy    . Uterine fibroid surgery    . Lumbar laminectomy/decompression microdiscectomy  06/25/2011    Procedure: LUMBAR LAMINECTOMY/DECOMPRESSION MICRODISCECTOMY;  Surgeon: Hosie Spangle, MD;  Location: [facility];  Service: Neurosurgery;  Laterality: Right;  RIGHT Lumbar Two-Three hemilaminectomy and microdiskectomy  . Vaginal hysterectomy        Medication  List       This list is accurate as of: 07/27/15 11:59 PM.  Always use your most recent med list.               docusate sodium 100 MG capsule  Commonly known as:  COLACE  Take 100 mg by mouth 2 (two) times daily. Reported on 07/27/2015     furosemide 20 MG tablet  Commonly known as:  LASIX  Take 20 mg by mouth daily.     HYDROmorphone 4 MG tablet  Commonly known as:  DILAUDID  Take 2 mg by mouth 2 (two) times daily as needed for moderate pain or severe pain.     loperamide 2 MG tablet  Commonly known as:  IMODIUM A-D  Take 4 mg by mouth 4 (four) times daily as needed for diarrhea or loose stools. Reported on 07/27/2015     magnesium hydroxide 400 MG/5ML suspension  Commonly known as:  MILK OF MAGNESIA  Take 30 mLs by mouth daily as needed for mild constipation. Reported on 07/27/2015     mirtazapine 15 MG tablet  Commonly known as:  REMERON  Take 7.5 mg by mouth at bedtime.     morphine 15 MG 12 hr tablet  Commonly known as:  MS CONTIN  Take one tablet by mouth twice daily for pain. Do not crush  multivitamin tablet  Take 1 tablet by mouth daily.     polyethylene glycol packet  Commonly known as:  MIRALAX / GLYCOLAX  Take 17 g by mouth every other day. Reported on 07/27/2015     potassium chloride 10 MEQ tablet  Commonly known as:  K-DUR  Take 20 mEq by mouth daily.     promethazine 25 MG tablet  Commonly known as:  PHENERGAN  Take 25 mg by mouth every 6 (six) hours as needed for nausea or vomiting.     rOPINIRole 2 MG tablet  Commonly known as:  REQUIP  TAKE 1-2 TABLETS BY MOUTH TWICE DAILY AS NEEDED (RESTLESS LEG).     sennosides-docusate sodium 8.6-50 MG tablet  Commonly known as:  SENOKOT-S  Take 2 tablets by mouth at bedtime.        No orders of the defined types were placed in this encounter.    Immunization History  Administered Date(s) Administered  . Influenza Split 04/15/2012  . Influenza,inj,Quad PF,36+ Mos 03/06/2014  . PPD Test 08/30/2014,  09/18/2014  . Pneumococcal Polysaccharide-23 08/29/2013    Social History  Substance Use Topics  . Smoking status: Never Smoker   . Smokeless tobacco: Never Used  . Alcohol Use: 0.0 oz/week    0 Standard drinks or equivalent per week     Comment: rarely    Review of Systems  DATA OBTAINED: from patient, nurse GENERAL:  no fevers, fatigue, appetite changes SKIN: No itching, rash HEENT: No complaint RESPIRATORY: No cough, wheezing, SOB CARDIAC: No chest pain, palpitations, lower extremity edema  GI: No abdominal pain, No N/V/D or constipation, No heartburn or reflux  GU: No dysuria, frequency or urgency, or incontinence  MUSCULOSKELETAL: No unrelieved bone/joint pain NEUROLOGIC: No headache, dizziness  PSYCHIATRIC: No overt anxiety or sadness  Filed Vitals:   07/28/15 2032  BP: 155/73  Pulse: 74  Temp: 97.7 F (36.5 C)  Resp: 18    Physical Exam  GENERAL APPEARANCE: Alert, conversant, No acute distress , sitting in her room reading the paper SKIN: No diaphoresis rash HEENT: Unremarkable RESPIRATORY: Breathing is even, unlabored. Lung sounds are clear   CARDIOVASCULAR: Heart RRR no murmurs, rubs or gallops. No peripheral edema  GASTROINTESTINAL: Abdomen is soft, non-tender, not distended w/ normal bowel sounds.  GENITOURINARY: Bladder non tender, not distended  MUSCULOSKELETAL: No abnormal joints or musculature NEUROLOGIC: Cranial nerves 2-12 grossly intact. Moves all extremities PSYCHIATRIC: Mood and affect appropriate to situation, no behavioral issues  Patient Active Problem List   Diagnosis Date Noted  . Narcotic addiction ([place]) 05/15/2015  . Diarrhea 03/30/2015  . Acute pharyngitis 02/26/2015  . Osteoarthritis of right knee 02/22/2015  . Pain in joint, lower leg 10/31/2014  . Ulcer of sacral region, stage 4 ([place]) 04/15/2014  . Moderate aortic stenosis 04/14/2014  . Pre-syncope 04/14/2014  . Hypotension 04/14/2014  . Acute on chronic renal failure ([place])  04/14/2014  . Anemia, iron deficiency 04/14/2014  . Protein-calorie malnutrition, severe ([place]) 04/14/2014  . Altered mental status   . Dehydration 04/13/2014  . Altered mental state 04/13/2014  . Acute kidney injury ([place])   . Hypokalemia 03/25/2014  . Syncope 03/16/2014  . Acute encephalopathy 03/16/2014  . Leg swelling 03/02/2014  . Avascular necrosis of bone of right hip ([place]) 09/27/2013  . Primary osteoarthritis of right hip 09/07/2013  . Spinal stenosis, lumbar region, with neurogenic claudication 09/07/2013  . Closed fracture of pubic ramus ([place]) 09/07/2013  . BPPV (benign paroxysmal positional vertigo) 09/07/2013  .  Pharyngeal swelling 08/30/2013  . Pelvic fracture ([place]) 08/28/2013  . Fall 08/26/2013  . Dizziness 08/26/2013  . Abnormal stress test 08/26/2013  . Preop cardiovascular exam 08/01/2013  . Aortic stenosis 08/01/2013  . Edema 06/30/2013  . Loss of weight 01/04/2013  . Situational stress 01/04/2013  . Chronic right hip pain 07/05/2012  . Knee pain, acute 06/29/2012  . Vertigo 06/24/2012  . Acute exacerbation of chronic low back pain 06/24/2012  . Lumbar pain with radiation down right leg 06/09/2012  . Osteoarthritis of hip 04/29/2012  . History of TIAs 04/17/2012  . S/P total hysterectomy and bilateral salpingo-oophorectomy 04/17/2012  . Hard of hearing 04/17/2012  . History of anemia 04/17/2012  . RLS (restless legs syndrome) 04/17/2012  . Chronic back pain 04/17/2012  . Spinal stenosis, lumbar 04/17/2012  . Lower back pain 12/20/2011  . Personal history of colonic polyps 10/08/2011  . H/O: CVA (cardiovascular accident) 09/14/2011  . Hypertension 09/14/2011  . Restless legs syndrome 09/14/2011  . Constipation 08/20/2011  . Urinary incontinence 08/20/2011    CBC    Component Value Date/Time   WBC 6.3 05/14/2015   WBC 6.0 03/28/2015 1031   RBC 3.82* 03/28/2015 1031   RBC 3.42* 06/28/2014 1205   HGB 9.2* 05/14/2015   HCT 28* 05/14/2015   PLT 207  05/14/2015   MCV 85.0 03/28/2015 1031   LYMPHSABS 1.5 03/28/2015 1031   MONOABS 0.4 03/28/2015 1031   EOSABS 0.1 03/28/2015 1031   BASOSABS 0.0 03/28/2015 1031    CMP     Component Value Date/Time   NA 120* 05/14/2015   NA 142 03/28/2015 1031   K 4.2 05/14/2015   CL 101 03/28/2015 1031   CO2 36* 03/28/2015 1031   GLUCOSE 87 03/28/2015 1031   BUN 38* 05/14/2015   BUN 35* 03/28/2015 1031   CREATININE 0.9 05/14/2015   CREATININE 0.88 03/28/2015 1031   CREATININE 0.78 06/28/2014 1205   CALCIUM 9.0 03/28/2015 1031   PROT 5.4* 06/28/2014 1205   ALBUMIN 3.0* 06/28/2014 1205   AST 14 06/28/2014 1205   ALT <8 06/28/2014 1205   ALKPHOS 94 06/28/2014 1205   BILITOT 0.5 06/28/2014 1205   GFRNONAA 70* 09/06/2014 2142   GFRAA 81* 09/06/2014 2142    Assessment and Plan  RLS (restless legs syndrome) No c/o;will continue prn requip  Chronic right hip pain Pt is without pain; pt on MS contin and scheduled dilaudid ; of course she is addicted but there is nothing to be done in a 82 yo for that  Constipation Pt on scheduled colace and senokot and without problems; cont cu=rrent meds    [name], MD

## 2015-07-28 NOTE — Assessment & Plan Note (Signed)
No c/o;will continue prn requip

## 2015-08-13 ENCOUNTER — Non-Acute Institutional Stay (SKILLED_NURSING_FACILITY): Payer: Medicare Other | Admitting: Internal Medicine

## 2015-08-13 DIAGNOSIS — J029 Acute pharyngitis, unspecified: Secondary | ICD-10-CM

## 2015-08-13 DIAGNOSIS — R609 Edema, unspecified: Secondary | ICD-10-CM

## 2015-08-13 DIAGNOSIS — D509 Iron deficiency anemia, unspecified: Secondary | ICD-10-CM | POA: Diagnosis not present

## 2015-08-13 NOTE — Progress Notes (Signed)
Patient ID: Shelly Martin, female   DOB: 02-Aug-1918, 82 y.o.   MRN: [id number] MRN: [id number] Name: Shelly Martin  Sex: female Age: 82 y.o. DOB: [birth year]  [place] [facility]: [name] Facility/Room: Level Of Care: SNF Provider: Wille Celeste Emergency Contacts: Extended Emergency Contact Information Primary Emergency Contact: [facility] Address: [place],  [address] [place] Phone: [phone] Relation: Daughter Secondary Emergency Contact: [place], [place] of [place] Phone: [phone] Relation: Son  Code Status:   Allergies: Amoxicillin; Baclofen; Hctz; and Valium  Chief Complaint  Patient presents with  . Acute Visit  secondary to sore throat--  HPI: Patient is 82 y.o. female who is being seen for complaints of sore throat-she says this is intermittent and at times has gotten better but it still bothers her.  She does not complain of any difficulty swallowing or chest congestion or cough or increased shortness of breath.  She also has a history of chronic lower extremity edema but this actually appears somewhat better as well-she does have compression socks on she says this helps with her pedal edema.  She is on Lasix 20 mg a day as well as potassium 20 mEq a day-most recent metabolic panel shows a creatinine 0.9 BUN of 43 we will update this-apparently she is eating and drinking fairly well.  Also note her hemoglobin in December 2016 was 9.2 which is relatively baseline this will need updating as well  Past Medical History  Diagnosis Date  . Hypertension   . Restless legs syndrome     on Requip  . Ulcer causing bleeding and hole in wall of stomach or small intestine   . Gastric ulcer     years ago  . Arthritis     Back, knees  . Constipation   . Urinary bladder calculus   . Urinary incontinence   . Bowel incontinence   . Aortic stenosis   . Stroke ([place])     Mini,  no residual  . TIA (transient ischemic attack)   . Colon polyps   . Anemia   . Spinal stenosis   . Vertigo   . Situational stress   . Acute encephalopathy   . Hypokalemia   . AKI (acute kidney injury) ([place])   . Altered mental state     Past Surgical History  Procedure Laterality Date  . Colon resection  50 years ago  . Tonsillectomy    . Uterine fibroid surgery    . Lumbar laminectomy/decompression microdiscectomy  06/25/2011    Procedure: LUMBAR LAMINECTOMY/DECOMPRESSION MICRODISCECTOMY;  Surgeon: Hosie Spangle, MD;  Location: [facility];  Service: Neurosurgery;  Laterality: Right;  RIGHT Lumbar Two-Three hemilaminectomy and microdiskectomy  . Vaginal hysterectomy        Medication List       This list is accurate as of: 08/13/15 11:59 PM.  Always use your most recent med list.               amLODipine 5 MG tablet  Commonly known as:  NORVASC  Take 5 mg by mouth daily.     docusate sodium 100 MG capsule  Commonly known as:  COLACE  Take 100 mg by mouth 2 (two) times daily. Reported on 07/27/2015     ENSURE PLUS Liqd  Take 237 mLs by mouth daily.     feeding supplement (PRO-STAT  SUGAR FREE 64) Liqd  Take 30 mLs by mouth 2 (two) times daily.     furosemide 20 MG tablet  Commonly known as:  LASIX  Take 20 mg by mouth daily.     HYDROmorphone 4 MG tablet  Commonly known as:  DILAUDID  Take 2 mg by mouth 2 (two) times daily as needed for moderate pain or severe pain.     loperamide 2 MG tablet  Commonly known as:  IMODIUM A-D  Take 4 mg by mouth 4 (four) times daily as needed for diarrhea or loose stools. Reported on 07/27/2015     magnesium hydroxide 400 MG/5ML suspension  Commonly known as:  MILK OF MAGNESIA  Take 30 mLs by mouth daily as needed for mild constipation. Reported on 07/27/2015     mirtazapine 15 MG tablet  Commonly known as:  REMERON  Take 7.5 mg by mouth at bedtime.     morphine 15 MG 12 hr tablet  Commonly known as:  MS CONTIN  Take one  tablet by mouth twice daily for pain. Do not crush     multivitamin tablet  Take 1 tablet by mouth daily.     polyethylene glycol packet  Commonly known as:  MIRALAX / GLYCOLAX  Take 17 g by mouth every other day. Reported on 07/27/2015     potassium chloride 10 MEQ tablet  Commonly known as:  K-DUR  Take 20 mEq by mouth daily.     promethazine 25 MG tablet  Commonly known as:  PHENERGAN  Take 25 mg by mouth every 6 (six) hours as needed for nausea or vomiting.     rOPINIRole 2 MG tablet  Commonly known as:  REQUIP  TAKE 1-2 TABLETS BY MOUTH TWICE DAILY AS NEEDED (RESTLESS LEG).     sennosides-docusate sodium 8.6-50 MG tablet  Commonly known as:  SENOKOT-S  Take 1 tablet by mouth at bedtime.          Immunization History  Administered Date(s) Administered  . Influenza Split 04/15/2012  . Influenza,inj,Quad PF,36+ Mos 03/06/2014  . PPD Test 08/30/2014, 09/18/2014  . Pneumococcal Polysaccharide-23 08/29/2013    Social History  Substance Use Topics  . Smoking status: Never Smoker   . Smokeless tobacco: Never Used  . Alcohol Use: 0.0 oz/week    0 Standard drinks or equivalent per week     Comment: rarely    Review of Systems  DATA OBTAINED: from patient, nurse GENERAL:  no fevers, fatigue, appetite changes SKIN: No itching, rash HEENT: Complains of intermittent sore throat RESPIRATORY: No cough, wheezing, SOB CARDIAC: No chest pain, palpitations, lower extremity edema  GI: No abdominal pain, No N/V/D or constipation, No heartburn or reflux  GU: No dysuria, frequency or urgency, or incontinence  MUSCULOSKELETAL: No unrelieved bone/joint pain history of chronic right hip pain continues on MS Contin and Dilaudid when necessary NEUROLOGIC: No headache, dizziness  PSYCHIATRIC: No overt anxiety or sadness  Filed Vitals:   08/13/15 2258  BP: 140/74  Pulse: 93    Physical Exam  GENERAL APPEARANCE: Alert, conversant, No acute distress , sitting in her room SKIN:  No diaphoresis rash HEENT:Visual acuity appears intact-oropharynx is clear mucous membranes moist I did not really see any increased significantly erythema or exudate in the throat RESPIRATORY: Breathing is even, unlabored. Lung sounds are clear   CARDIOVASCULAR: Heart RRR with a 2-3/6 murmur murmurs, rubs or gallops. Improved lower extremity edema I would say one plus which is somewhat improved for when  I last saw her iGASTROINTESTINAL: Abdomen is soft, non-tender, not distended w/ normal bowel sounds.  GENITOURINARY: Bladder non tender, not distended  MUSCULOSKELETAL: No abnormal joints or musculature--is ambulatory with a walker  NEUROLOGIC: Cranial nerves 2-12 grossly intact. Moves all extremities PSYCHIATRIC: Mood and affect appropriate to situation, no behavioral issues  Patient Active Problem List   Diagnosis Date Noted  . Narcotic addiction ([place]) 05/15/2015  . Diarrhea 03/30/2015  . Acute pharyngitis 02/26/2015  . Osteoarthritis of right knee 02/22/2015  . Pain in joint, lower leg 10/31/2014  . Ulcer of sacral region, stage 4 ([place]) 04/15/2014  . Moderate aortic stenosis 04/14/2014  . Pre-syncope 04/14/2014  . Hypotension 04/14/2014  . Acute on chronic renal failure ([place]) 04/14/2014  . Anemia, iron deficiency 04/14/2014  . Protein-calorie malnutrition, severe ([place]) 04/14/2014  . Altered mental status   . Dehydration 04/13/2014  . Altered mental state 04/13/2014  . Acute kidney injury ([place])   . Hypokalemia 03/25/2014  . Syncope 03/16/2014  . Acute encephalopathy 03/16/2014  . Leg swelling 03/02/2014  . Avascular necrosis of bone of right hip ([place]) 09/27/2013  . Primary osteoarthritis of right hip 09/07/2013  . Spinal stenosis, lumbar region, with neurogenic claudication 09/07/2013  . Closed fracture of pubic ramus ([place]) 09/07/2013  . BPPV (benign paroxysmal positional vertigo) 09/07/2013  . Pharyngeal swelling 08/30/2013  . Pelvic fracture ([place]) 08/28/2013  . Fall  08/26/2013  . Dizziness 08/26/2013  . Abnormal stress test 08/26/2013  . Preop cardiovascular exam 08/01/2013  . Aortic stenosis 08/01/2013  . Edema 06/30/2013  . Loss of weight 01/04/2013  . Situational stress 01/04/2013  . Chronic right hip pain 07/05/2012  . Knee pain, acute 06/29/2012  . Vertigo 06/24/2012  . Acute exacerbation of chronic low back pain 06/24/2012  . Lumbar pain with radiation down right leg 06/09/2012  . Osteoarthritis of hip 04/29/2012  . History of TIAs 04/17/2012  . S/P total hysterectomy and bilateral salpingo-oophorectomy 04/17/2012  . Hard of hearing 04/17/2012  . History of anemia 04/17/2012  . RLS (restless legs syndrome) 04/17/2012  . Chronic back pain 04/17/2012  . Spinal stenosis, lumbar 04/17/2012  . Lower back pain 12/20/2011  . Personal history of colonic polyps 10/08/2011  . H/O: CVA (cardiovascular accident) 09/14/2011  . Hypertension 09/14/2011  . Restless legs syndrome 09/14/2011  . Constipation 08/20/2011  . Urinary incontinence 08/20/2011     Labs.  05/14/2015.  WBC 6.3 hemoglobin 9.2 platelets 207.  05/04/2015.  Sodium 140 potassium 4.2 BUN 43 creatinine 0.9 CO2 34    Assessment and Plan  #1 pharyngitis-physical exam was quite benign at this point will monitor and order Chloraseptic spray when necessary if problem persist certainly will have to readdress.  #2 history of lower extremity edema this appears to be stable to somewhat improved she is on Lasix with potassium will update a metabolic panel to ensure stability of electrolytes and renal function.  #3 anemia most recent hemoglobin 9.2*suspect there is an element of chronic disease she actually has seen GI as well-will update a CBC  [id number]   Arion Morgan C,

## 2015-08-15 LAB — CBC AND DIFFERENTIAL
HEMATOCRIT: 30 % — AB (ref 36–46)
HEMOGLOBIN: 9.6 g/dL — AB (ref 12.0–16.0)
PLATELETS: 302 10*3/uL (ref 150–399)
WBC: 9.2 10^3/mL

## 2015-08-15 LAB — BASIC METABOLIC PANEL
BUN: 34 mg/dL — AB (ref 4–21)
Creatinine: 0.8 mg/dL (ref 0.5–1.1)
GLUCOSE: 69 mg/dL
POTASSIUM: 4.7 mmol/L (ref 3.4–5.3)
SODIUM: 141 mmol/L (ref 137–147)

## 2015-08-15 LAB — HEPATIC FUNCTION PANEL
ALK PHOS: 89 U/L (ref 25–125)
ALT: 8 U/L (ref 7–35)
AST: 15 U/L (ref 13–35)
Bilirubin, Total: 0.4 mg/dL

## 2015-08-23 ENCOUNTER — Other Ambulatory Visit: Payer: Self-pay

## 2015-08-23 MED ORDER — MORPHINE SULFATE ER 15 MG PO TBCR: EXTENDED_RELEASE_TABLET | ORAL | Status: DC

## 2015-08-28 NOTE — Progress Notes (Signed)
This encounter was created in error - please disregard.

## 2015-09-25 ENCOUNTER — Non-Acute Institutional Stay (SKILLED_NURSING_FACILITY): Payer: Medicare Other | Admitting: Internal Medicine

## 2015-09-25 ENCOUNTER — Encounter: Payer: Self-pay | Admitting: Internal Medicine

## 2015-09-25 DIAGNOSIS — L98429 Non-pressure chronic ulcer of back with unspecified severity: Secondary | ICD-10-CM

## 2015-09-25 DIAGNOSIS — I10 Essential (primary) hypertension: Secondary | ICD-10-CM | POA: Diagnosis not present

## 2015-09-25 DIAGNOSIS — L89154 Pressure ulcer of sacral region, stage 4: Secondary | ICD-10-CM | POA: Diagnosis not present

## 2015-09-25 NOTE — Assessment & Plan Note (Signed)
Hb 9.6, stable but not improving; have ordered anemia panel

## 2015-09-25 NOTE — Assessment & Plan Note (Signed)
Chronic and stable; still being followed by wound care; taking Joven now for 30 days to help with wound care

## 2015-09-25 NOTE — Assessment & Plan Note (Signed)
Controlled; cont norvasc 5 mg and lasix 40 mg daily

## 2015-09-25 NOTE — Progress Notes (Addendum)
MRN: [id number] Name: Shelly Martin  Sex: female Age: 82 y.o. DOB: 1919/03/29  [place] #: [name] Facility/Room:303 Level Of Care: SNF Provider: Inocencio Homes D Emergency Contacts: Extended Emergency Contact Information Primary Emergency Contact: [facility] Address: [place], [place] [address] [place] Phone: [phone] Relation: Daughter Secondary Emergency Contact: [place], [place] of [place] Phone: [phone] Relation: Son  Code Status:   Allergies: Amoxicillin; Baclofen; Hctz; and Valium  Chief Complaint  Patient presents with  . Medical Management of Chronic Issues    HPI: Patient is 82 y.o. female who is being seen for routine issues of HTN, stage 4 sacral wound and anemia.  Past Medical History  Diagnosis Date  . Hypertension   . Restless legs syndrome     on Requip  . Ulcer causing bleeding and hole in wall of stomach or small intestine   . Gastric ulcer     years ago  . Arthritis     Back, knees  . Constipation   . Urinary bladder calculus   . Urinary incontinence   . Bowel incontinence   . Aortic stenosis   . Stroke ([place])     Mini, no residual  . TIA (transient ischemic attack)   . Colon polyps   . Anemia   . Spinal stenosis   . Vertigo   . Situational stress   . Acute encephalopathy   . Hypokalemia   . AKI (acute kidney injury) ([place])   . Altered mental state     Past Surgical History  Procedure Laterality Date  . Colon resection  50 years ago  . Tonsillectomy    . Uterine fibroid surgery    . Lumbar laminectomy/decompression microdiscectomy  06/25/2011    Procedure: LUMBAR LAMINECTOMY/DECOMPRESSION MICRODISCECTOMY;  Surgeon: Hosie Spangle, MD;  Location: [facility];  Service: Neurosurgery;  Laterality: Right;  RIGHT Lumbar Two-Three hemilaminectomy and microdiskectomy  . Vaginal hysterectomy        Medication List       This list is  accurate as of: 09/25/15 11:59 PM.  Always use your most recent med list.               amLODipine 5 MG tablet  Commonly known as:  NORVASC  Take 5 mg by mouth daily.     docusate sodium 100 MG capsule  Commonly known as:  COLACE  Take 100 mg by mouth 2 (two) times daily. Reported on 07/27/2015     ENSURE PLUS Liqd  Take 237 mLs by mouth daily.     feeding supplement (PRO-STAT SUGAR FREE 64) Liqd  Take 30 mLs by mouth 2 (two) times daily.     furosemide 20 MG tablet  Commonly known as:  LASIX  Take 20 mg by mouth daily.     HYDROmorphone 4 MG tablet  Commonly known as:  DILAUDID  Take 2 mg by mouth 2 (two) times daily as needed for moderate pain or severe pain.     loperamide 2 MG tablet  Commonly known as:  IMODIUM A-D  Take 4 mg by mouth 4 (four) times daily as needed for diarrhea or loose stools. Reported on 07/27/2015     magnesium hydroxide 400 MG/5ML suspension  Commonly known as:  MILK OF MAGNESIA  Take 30 mLs by mouth daily as needed for mild constipation. Reported on  07/27/2015     mirtazapine 15 MG tablet  Commonly known as:  REMERON  Take 7.5 mg by mouth at bedtime.     morphine 15 MG 12 hr tablet  Commonly known as:  MS CONTIN  Take one tablet by mouth twice daily for pain. Do not crush     multivitamin tablet  Take 1 tablet by mouth daily.     polyethylene glycol packet  Commonly known as:  MIRALAX / GLYCOLAX  Take 17 g by mouth every other day. Reported on 07/27/2015     potassium chloride 10 MEQ tablet  Commonly known as:  K-DUR  Take 20 mEq by mouth daily.     promethazine 25 MG tablet  Commonly known as:  PHENERGAN  Take 25 mg by mouth every 6 (six) hours as needed for nausea or vomiting.     rOPINIRole 2 MG tablet  Commonly known as:  REQUIP  TAKE 1-2 TABLETS BY MOUTH TWICE DAILY AS NEEDED (RESTLESS LEG).     sennosides-docusate sodium 8.6-50 MG tablet  Commonly known as:  SENOKOT-S  Take 1 tablet by mouth at bedtime.        No orders of  the defined types were placed in this encounter.    Immunization History  Administered Date(s) Administered  . Influenza Split 04/15/2012  . Influenza,inj,Quad PF,36+ Mos 03/06/2014  . PPD Test 08/30/2014, 09/18/2014  . Pneumococcal Polysaccharide-23 08/29/2013    Social History  Substance Use Topics  . Smoking status: Never Smoker   . Smokeless tobacco: Never Used  . Alcohol Use: 0.0 oz/week    0 Standard drinks or equivalent per week     Comment: rarely    Review of Systems  DATA OBTAINED: from nurse GENERAL:  no fevers, fatigue, appetite changes SKIN: No itching, rash HEENT: No complaint RESPIRATORY: No cough, wheezing, SOB CARDIAC: No chest pain, palpitations, lower extremity edema  GI: No abdominal pain, No N/V/D or constipation, No heartburn or reflux  GU: No dysuria, frequency or urgency, or incontinence  MUSCULOSKELETAL: No unrelieved bone/joint pain NEUROLOGIC: No headache, dizziness  PSYCHIATRIC: No overt anxiety or sadness  Filed Vitals:   09/25/15 1454  BP: 114/70  Pulse: 66  Temp: 97.3 F (36.3 C)  Resp: 17    Physical Exam  GENERAL APPEARANCE: Alert, conversant, No acute distress  SKIN: No diaphoresis rash, or wounds HEENT: Unremarkable RESPIRATORY: Breathing is even, unlabored. Lung sounds are clear   CARDIOVASCULAR: Heart RRR no murmurs, rubs or gallops; trace  peripheral edema ankles R>L GASTROINTESTINAL: Abdomen is soft, non-tender, not distended w/ normal bowel sounds.  GENITOURINARY: Bladder non tender, not distended  MUSCULOSKELETAL: No abnormal joints or musculature NEUROLOGIC: Cranial nerves 2-12 grossly intact. Moves all extremities PSYCHIATRIC: Mood and affect appropriate to situation with dementia, no behavioral issues  Patient Active Problem List   Diagnosis Date Noted  . Narcotic addiction ([place]) 05/15/2015  . Diarrhea 03/30/2015  . Acute pharyngitis 02/26/2015  . Osteoarthritis of right knee 02/22/2015  . Pain in joint, lower  leg 10/31/2014  . Ulcer of sacral region, stage 4 ([place]) 04/15/2014  . Moderate aortic stenosis 04/14/2014  . Pre-syncope 04/14/2014  . Hypotension 04/14/2014  . Acute on chronic renal failure ([place]) 04/14/2014  . Anemia 04/14/2014  . Protein-calorie malnutrition, severe ([place]) 04/14/2014  . Altered mental status   . Dehydration 04/13/2014  . Altered mental state 04/13/2014  . Acute kidney injury ([place])   . Hypokalemia 03/25/2014  . Syncope 03/16/2014  . Acute encephalopathy  03/16/2014  . Leg swelling 03/02/2014  . Avascular necrosis of bone of right hip ([place]) 09/27/2013  . Primary osteoarthritis of right hip 09/07/2013  . Spinal stenosis, lumbar region, with neurogenic claudication 09/07/2013  . Closed fracture of pubic ramus ([place]) 09/07/2013  . BPPV (benign paroxysmal positional vertigo) 09/07/2013  . Pharyngeal swelling 08/30/2013  . Pelvic fracture ([place]) 08/28/2013  . Fall 08/26/2013  . Dizziness 08/26/2013  . Abnormal stress test 08/26/2013  . Preop cardiovascular exam 08/01/2013  . Aortic stenosis 08/01/2013  . Edema 06/30/2013  . Loss of weight 01/04/2013  . Situational stress 01/04/2013  . Chronic right hip pain 07/05/2012  . Knee pain, acute 06/29/2012  . Vertigo 06/24/2012  . Acute exacerbation of chronic low back pain 06/24/2012  . Lumbar pain with radiation down right leg 06/09/2012  . Osteoarthritis of hip 04/29/2012  . History of TIAs 04/17/2012  . S/P total hysterectomy and bilateral salpingo-oophorectomy 04/17/2012  . Hard of hearing 04/17/2012  . History of anemia 04/17/2012  . RLS (restless legs syndrome) 04/17/2012  . Chronic back pain 04/17/2012  . Spinal stenosis, lumbar 04/17/2012  . Lower back pain 12/20/2011  . Personal history of colonic polyps 10/08/2011  . H/O: CVA (cardiovascular accident) 09/14/2011  . Hypertension 09/14/2011  . Restless legs syndrome 09/14/2011  . Constipation 08/20/2011  . Urinary incontinence 08/20/2011    CBC     Component Value Date/Time   WBC 6.3 05/14/2015   WBC 6.0 03/28/2015 1031   RBC 3.82* 03/28/2015 1031   RBC 3.42* 06/28/2014 1205   HGB 9.2* 05/14/2015   HCT 28* 05/14/2015   PLT 207 05/14/2015   MCV 85.0 03/28/2015 1031   LYMPHSABS 1.5 03/28/2015 1031   MONOABS 0.4 03/28/2015 1031   EOSABS 0.1 03/28/2015 1031   BASOSABS 0.0 03/28/2015 1031    CMP     Component Value Date/Time   NA 120* 05/14/2015   NA 142 03/28/2015 1031   K 4.2 05/14/2015   CL 101 03/28/2015 1031   CO2 36* 03/28/2015 1031   GLUCOSE 87 03/28/2015 1031   BUN 38* 05/14/2015   BUN 35* 03/28/2015 1031   CREATININE 0.9 05/14/2015   CREATININE 0.88 03/28/2015 1031   CREATININE 0.78 06/28/2014 1205   CALCIUM 9.0 03/28/2015 1031   PROT 5.4* 06/28/2014 1205   ALBUMIN 3.0* 06/28/2014 1205   AST 14 06/28/2014 1205   ALT <8 06/28/2014 1205   ALKPHOS 94 06/28/2014 1205   BILITOT 0.5 06/28/2014 1205   GFRNONAA 70* 09/06/2014 2142   GFRAA 81* 09/06/2014 2142    Assessment and Plan  Hypertension Controlled; cont norvasc 5 mg and lasix 40 mg daily  Ulcer of sacral region, stage 4 ([facility]) Chronic and stable; still being followed by wound care; taking Joven now for 30 days to help with wound care  Anemia Hb 9.6, stable but not improving; have ordered anemia panel    [name], MD

## 2015-09-26 ENCOUNTER — Encounter: Payer: Self-pay | Admitting: Internal Medicine

## 2015-09-26 NOTE — Progress Notes (Signed)
MRN: [id number] Name: Shelly Martin  Sex: female Age: 82 y.o. DOB: Apr 23, 1919  [place] #:  Facility/Room: Level Of Care: SNF Provider: Inocencio Homes D Emergency Contacts: Extended Emergency Contact Information Primary Emergency Contact: [facility] Address: [place], [place] [address] [place] Phone: [phone] Relation: Daughter Secondary Emergency Contact: [place], [place] of [place] Phone: [phone] Relation: Son  Code Status:   Allergies: Amoxicillin; Baclofen; Hctz; and Valium  Chief Complaint  Patient presents with  . Acute Visit    HPI: Patient is 82 y.o. female who increased pedal edema, wearing TED hose already  Past Medical History  Diagnosis Date  . Hypertension   . Restless legs syndrome     on Requip  . Ulcer causing bleeding and hole in wall of stomach or small intestine   . Gastric ulcer     years ago  . Arthritis     Back, knees  . Constipation   . Urinary bladder calculus   . Urinary incontinence   . Bowel incontinence   . Aortic stenosis   . Stroke ([place])     Mini, no residual  . TIA (transient ischemic attack)   . Colon polyps   . Anemia   . Spinal stenosis   . Vertigo   . Situational stress   . Acute encephalopathy   . Hypokalemia   . AKI (acute kidney injury) ([place])   . Altered mental state     Past Surgical History  Procedure Laterality Date  . Colon resection  50 years ago  . Tonsillectomy    . Uterine fibroid surgery    . Lumbar laminectomy/decompression microdiscectomy  06/25/2011    Procedure: LUMBAR LAMINECTOMY/DECOMPRESSION MICRODISCECTOMY;  Surgeon: Hosie Spangle, MD;  Location: [facility];  Service: Neurosurgery;  Laterality: Right;  RIGHT Lumbar Two-Three hemilaminectomy and microdiskectomy  . Vaginal hysterectomy        Medication List       This list is accurate as of: 09/26/15  3:20 PM.  Always use your most recent  med list.               amLODipine 5 MG tablet  Commonly known as:  NORVASC  Take 5 mg by mouth daily.     docusate sodium 100 MG capsule  Commonly known as:  COLACE  Take 100 mg by mouth 2 (two) times daily. Reported on 07/27/2015     ENSURE PLUS Liqd  Take 237 mLs by mouth daily.     feeding supplement (PRO-STAT SUGAR FREE 64) Liqd  Take 30 mLs by mouth 2 (two) times daily.     furosemide 20 MG tablet  Commonly known as:  LASIX  Take 20 mg by mouth daily.     HYDROmorphone 4 MG tablet  Commonly known as:  DILAUDID  Take 2 mg by mouth 2 (two) times daily as needed for moderate pain or severe pain.     loperamide 2 MG tablet  Commonly known as:  IMODIUM A-D  Take 4 mg by mouth 4 (four) times daily as needed for diarrhea or loose stools. Reported on 07/27/2015     magnesium hydroxide 400 MG/5ML suspension  Commonly known as:  MILK OF MAGNESIA  Take 30 mLs by mouth daily as needed for mild constipation. Reported on 07/27/2015     mirtazapine 15 MG tablet  Commonly known as:  REMERON  Take 7.5 mg by mouth at bedtime.     morphine 15 MG 12 hr tablet  Commonly known as:  MS CONTIN  Take one tablet by mouth twice daily for pain. Do not crush     multivitamin tablet  Take 1 tablet by mouth daily.     polyethylene glycol packet  Commonly known as:  MIRALAX / GLYCOLAX  Take 17 g by mouth every other day. Reported on 07/27/2015     potassium chloride 10 MEQ tablet  Commonly known as:  K-DUR  Take 20 mEq by mouth daily.     promethazine 25 MG tablet  Commonly known as:  PHENERGAN  Take 25 mg by mouth every 6 (six) hours as needed for nausea or vomiting.     rOPINIRole 2 MG tablet  Commonly known as:  REQUIP  TAKE 1-2 TABLETS BY MOUTH TWICE DAILY AS NEEDED (RESTLESS LEG).     sennosides-docusate sodium 8.6-50 MG tablet  Commonly known as:  SENOKOT-S  Take 1 tablet by mouth at bedtime.        No orders of the defined types were placed in this encounter.     Immunization History  Administered Date(s) Administered  . Influenza Split 04/15/2012  . Influenza,inj,Quad PF,36+ Mos 03/06/2014  . PPD Test 08/30/2014, 09/18/2014  . Pneumococcal Polysaccharide-23 08/29/2013    Social History  Substance Use Topics  . Smoking status: Never Smoker   . Smokeless tobacco: Never Used  . Alcohol Use: 0.0 oz/week    0 Standard drinks or equivalent per week     Comment: rarely    Review of Systems  DATA OBTAINED: from patient, nurse, medical record, family member GENERAL:  no fevers, fatigue, appetite changes SKIN: No itching, rash HEENT: No complaint RESPIRATORY: No cough, wheezing, SOB CARDIAC: No chest pain, palpitations, lower extremity edema  GI: No abdominal pain, No N/V/D or constipation, No heartburn or reflux  GU: No dysuria, frequency or urgency, or incontinence  MUSCULOSKELETAL: No unrelieved bone/joint pain NEUROLOGIC: No headache, dizziness  PSYCHIATRIC: No overt anxiety or sadness  Filed Vitals:   09/26/15 1512  BP: 114/70  Pulse: 66  Temp: 97.3 F (36.3 C)  Resp: 17    Physical Exam  GENERAL APPEARANCE: Alert, conversant, No acute distress  SKIN: No diaphoresis rash, or wounds HEENT: Unremarkable RESPIRATORY: Breathing is even, unlabored. Lung sounds are clear   CARDIOVASCULAR: Heart RRR no murmurs, rubs or gallops. No peripheral edema  GASTROINTESTINAL: Abdomen is soft, non-tender, not distended w/ normal bowel sounds.  GENITOURINARY: Bladder non tender, not distended  MUSCULOSKELETAL: No abnormal joints or musculature NEUROLOGIC: Cranial nerves 2-12 grossly intact. Moves all extremities PSYCHIATRIC: Mood and affect appropriate to situation, no behavioral issues  Patient Active Problem List   Diagnosis Date Noted  . Narcotic addiction ([place]) 05/15/2015  . Diarrhea 03/30/2015  . Acute pharyngitis 02/26/2015  . Osteoarthritis of right knee 02/22/2015  . Pain in joint, lower leg 10/31/2014  . Ulcer of sacral  region, stage 4 ([place]) 04/15/2014  . Moderate aortic stenosis 04/14/2014  . Pre-syncope 04/14/2014  . Hypotension 04/14/2014  . Acute on chronic renal failure ([place]) 04/14/2014  . Anemia 04/14/2014  . Protein-calorie malnutrition, severe ([place]) 04/14/2014  . Altered mental status   . Dehydration 04/13/2014  . Altered mental state 04/13/2014  . Acute kidney injury ([place])   . Hypokalemia 03/25/2014  . Syncope 03/16/2014  . Acute encephalopathy 03/16/2014  . Leg swelling 03/02/2014  . Avascular  necrosis of bone of right hip ([place]) 09/27/2013  . Primary osteoarthritis of right hip 09/07/2013  . Spinal stenosis, lumbar region, with neurogenic claudication 09/07/2013  . Closed fracture of pubic ramus ([place]) 09/07/2013  . BPPV (benign paroxysmal positional vertigo) 09/07/2013  . Pharyngeal swelling 08/30/2013  . Pelvic fracture ([place]) 08/28/2013  . Fall 08/26/2013  . Dizziness 08/26/2013  . Abnormal stress test 08/26/2013  . Preop cardiovascular exam 08/01/2013  . Aortic stenosis 08/01/2013  . Edema 06/30/2013  . Loss of weight 01/04/2013  . Situational stress 01/04/2013  . Chronic right hip pain 07/05/2012  . Knee pain, acute 06/29/2012  . Vertigo 06/24/2012  . Acute exacerbation of chronic low back pain 06/24/2012  . Lumbar pain with radiation down right leg 06/09/2012  . Osteoarthritis of hip 04/29/2012  . History of TIAs 04/17/2012  . S/P total hysterectomy and bilateral salpingo-oophorectomy 04/17/2012  . Hard of hearing 04/17/2012  . History of anemia 04/17/2012  . RLS (restless legs syndrome) 04/17/2012  . Chronic back pain 04/17/2012  . Spinal stenosis, lumbar 04/17/2012  . Lower back pain 12/20/2011  . Personal history of colonic polyps 10/08/2011  . H/O: CVA (cardiovascular accident) 09/14/2011  . Hypertension 09/14/2011  . Restless legs syndrome 09/14/2011  . Constipation 08/20/2011  . Urinary incontinence 08/20/2011    CBC    Component Value Date/Time   WBC 6.3  05/14/2015   WBC 6.0 03/28/2015 1031   RBC 3.82* 03/28/2015 1031   RBC 3.42* 06/28/2014 1205   HGB 9.2* 05/14/2015   HCT 28* 05/14/2015   PLT 207 05/14/2015   MCV 85.0 03/28/2015 1031   LYMPHSABS 1.5 03/28/2015 1031   MONOABS 0.4 03/28/2015 1031   EOSABS 0.1 03/28/2015 1031   BASOSABS 0.0 03/28/2015 1031    CMP     Component Value Date/Time   NA 120* 05/14/2015   NA 142 03/28/2015 1031   K 4.2 05/14/2015   CL 101 03/28/2015 1031   CO2 36* 03/28/2015 1031   GLUCOSE 87 03/28/2015 1031   BUN 38* 05/14/2015   BUN 35* 03/28/2015 1031   CREATININE 0.9 05/14/2015   CREATININE 0.88 03/28/2015 1031   CREATININE 0.78 06/28/2014 1205   CALCIUM 9.0 03/28/2015 1031   PROT 5.4* 06/28/2014 1205   ALBUMIN 3.0* 06/28/2014 1205   AST 14 06/28/2014 1205   ALT <8 06/28/2014 1205   ALKPHOS 94 06/28/2014 1205   BILITOT 0.5 06/28/2014 1205   GFRNONAA 70* 09/06/2014 2142   GFRAA 81* 09/06/2014 2142    Assessment and Plan  No problem-specific assessment & plan notes found for this encounter.   [name], MD     This encounter was created in error - please disregard.

## 2015-10-05 LAB — BASIC METABOLIC PANEL
BUN: 38 mg/dL — AB (ref 4–21)
Creatinine: 0.8 mg/dL (ref 0.5–1.1)
GLUCOSE: 68 mg/dL
Potassium: 4.5 mmol/L (ref 3.4–5.3)
Sodium: 138 mmol/L (ref 137–147)

## 2015-10-26 ENCOUNTER — Non-Acute Institutional Stay (SKILLED_NURSING_FACILITY): Payer: Medicare Other | Admitting: Internal Medicine

## 2015-10-26 ENCOUNTER — Encounter: Payer: Self-pay | Admitting: Internal Medicine

## 2015-10-26 DIAGNOSIS — M1611 Unilateral primary osteoarthritis, right hip: Secondary | ICD-10-CM | POA: Diagnosis not present

## 2015-10-26 DIAGNOSIS — G2581 Restless legs syndrome: Secondary | ICD-10-CM | POA: Diagnosis not present

## 2015-10-26 DIAGNOSIS — K5901 Slow transit constipation: Secondary | ICD-10-CM | POA: Diagnosis not present

## 2015-10-26 NOTE — Progress Notes (Signed)
MRN: [id number] Name: Shelly Martin  Sex: female Age: 82 y.o. DOB: [birth year]  [place] #:  Facility/Room: [facility] / 303 D Level Of Care: SNF Provider: [name], MD Emergency Contacts: Extended Emergency Contact Information Primary Emergency Contact: [facility] Address: [place], [place] [address] [name] of [place] Phone: [phone] Relation: Daughter Secondary Emergency Contact: [place], [place] of [place] Phone: [phone] Relation: Son  Code Status: DNR  Allergies: Amoxicillin; Baclofen; Hctz; and Valium  Chief Complaint  Patient presents with  . Medical Management of Chronic Issues    Routine Visit    HPI: Patient is 82 y.o. female who is being seen for routine issues of constipation, RLS and chronic hip pain.   Past Medical History  Diagnosis Date  . Hypertension   . Restless legs syndrome     on Requip  . Ulcer causing bleeding and hole in wall of stomach or small intestine   . Gastric ulcer     years ago  . Arthritis     Back, knees  . Constipation   . Urinary bladder calculus   . Urinary incontinence   . Bowel incontinence   . Aortic stenosis   . Stroke ([place])     Mini, no residual  . TIA (transient ischemic attack)   . Colon polyps   . Anemia   . Spinal stenosis   . Vertigo   . Situational stress   . Acute encephalopathy   . Hypokalemia   . AKI (acute kidney injury) ([place])   . Altered mental state     Past Surgical History  Procedure Laterality Date  . Colon resection  50 years ago  . Tonsillectomy    . Uterine fibroid surgery    . Lumbar laminectomy/decompression microdiscectomy  06/25/2011    Procedure: LUMBAR LAMINECTOMY/DECOMPRESSION MICRODISCECTOMY;  Surgeon: Hosie Spangle, MD;  Location: [facility];  Service: Neurosurgery;  Laterality: Right;  RIGHT Lumbar Two-Three hemilaminectomy and microdiskectomy  . Vaginal hysterectomy         Medication List       This list is accurate as of: 10/26/15 11:59 PM.  Always use your most recent med list.               amLODipine 5 MG tablet  Commonly known as:  NORVASC  Take 5 mg by mouth daily.     cetaphil lotion  Apply to dry skin on body once daily     ENSURE PLUS Liqd  Take 237 mLs by mouth 2 (two) times daily between meals. Chocolate     feeding supplement (PRO-STAT SUGAR FREE 64) Liqd  Take 30 mLs by mouth 2 (two) times daily.     furosemide 20 MG tablet  Commonly known as:  LASIX  Take 40 mg by mouth daily.     HYDROmorphone 4 MG tablet  Commonly known as:  DILAUDID  Take 2 mg by mouth 2 (two) times daily as needed for moderate pain or severe pain.     magnesium hydroxide 400 MG/5ML suspension  Commonly known as:  MILK OF MAGNESIA  Take 30 mLs by mouth daily as needed for mild constipation. Reported on 07/27/2015     mirtazapine 15 MG tablet  Commonly known as:  REMERON  Take 7.5 mg by mouth at bedtime.     morphine 15 MG 12 hr  tablet  Commonly known as:  MS CONTIN  Take one tablet by mouth twice daily for pain. Do not crush     multivitamin tablet  Take 1 tablet by mouth daily.     ondansetron 4 MG tablet  Commonly known as:  ZOFRAN  Take 4 mg by mouth every 12 (twelve) hours as needed for nausea or vomiting.     phenol 1.4 % Liqd  Commonly known as:  CHLORASEPTIC  Use as directed 1 spray in the mouth or throat 3 (three) times daily as needed for throat irritation / pain. If sore throat persist notify the MD     potassium chloride 10 MEQ tablet  Commonly known as:  K-DUR  Take 20 mEq by mouth daily.     rOPINIRole 2 MG tablet  Commonly known as:  REQUIP  Take 4 mg by mouth at bedtime.     sennosides-docusate sodium 8.6-50 MG tablet  Commonly known as:  SENOKOT-S  Take 1 tablet by mouth 2 (two) times daily. Hold for diarrhea        Meds ordered this encounter  Medications  . ondansetron (ZOFRAN) 4 MG tablet    Sig: Take 4 mg by mouth  every 12 (twelve) hours as needed for nausea or vomiting.  . phenol (CHLORASEPTIC) 1.4 % LIQD    Sig: Use as directed 1 spray in the mouth or throat 3 (three) times daily as needed for throat irritation / pain. If sore throat persist notify the MD  . rOPINIRole (REQUIP) 2 MG tablet    Sig: Take 4 mg by mouth at bedtime.  . cetaphil (CETAPHIL) lotion    Sig: Apply to dry skin on body once daily    Immunization History  Administered Date(s) Administered  . Influenza Split 04/15/2012  . Influenza,inj,Quad PF,36+ Mos 03/06/2014  . PPD Test 08/30/2014, 09/18/2014  . Pneumococcal Polysaccharide-23 08/29/2013    Social History  Substance Use Topics  . Smoking status: Never Smoker   . Smokeless tobacco: Never Used  . Alcohol Use: 0.0 oz/week    0 Standard drinks or equivalent per week     Comment: rarely    Review of Systems  DATA OBTAINED: from patient, nurse GENERAL:  no fevers, fatigue, appetite changes SKIN: No itching, rash HEENT: No complaint RESPIRATORY: No cough, wheezing, SOB CARDIAC: No chest pain, palpitations, lower extremity edema  GI: No abdominal pain, No N/V/D or constipation, No heartburn or reflux  GU: No dysuria, frequency or urgency, or incontinence  MUSCULOSKELETAL: No unrelieved bone/joint pain NEUROLOGIC: No headache, dizziness  PSYCHIATRIC: No overt anxiety or sadness  Filed Vitals:   10/26/15 1230  BP: 114/70  Pulse: 76  Temp: 97.3 F (36.3 C)  Resp: 16    Physical Exam  GENERAL APPEARANCE: Alert, conversant, No acute distress; in WC in hall SKIN: No diaphoresis rash HEENT: Unremarkable RESPIRATORY: Breathing is even, unlabored. Lung sounds are clear   CARDIOVASCULAR: Heart RRR no murmurs, rubs or gallops. No peripheral edema  GASTROINTESTINAL: Abdomen is soft, non-tender, not distended w/ normal bowel sounds.  GENITOURINARY: Bladder non tender, not distended  MUSCULOSKELETAL: No abnormal joints or musculature NEUROLOGIC: Cranial nerves 2-12  grossly intact. Moves all extremities PSYCHIATRIC: Mood and affect appropriate to situation, no behavioral issues  Patient Active Problem List   Diagnosis Date Noted  . Narcotic addiction ([place]) 05/15/2015  . Diarrhea 03/30/2015  . Acute pharyngitis 02/26/2015  . Osteoarthritis of right knee 02/22/2015  . Pain in joint, lower leg 10/31/2014  .  Ulcer of sacral region, stage 4 ([place]) 04/15/2014  . Moderate aortic stenosis 04/14/2014  . Pre-syncope 04/14/2014  . Hypotension 04/14/2014  . Acute on chronic renal failure ([place]) 04/14/2014  . Anemia 04/14/2014  . Protein-calorie malnutrition, severe ([place]) 04/14/2014  . Altered mental status   . Dehydration 04/13/2014  . Altered mental state 04/13/2014  . Acute kidney injury ([place])   . Hypokalemia 03/25/2014  . Syncope 03/16/2014  . Acute encephalopathy 03/16/2014  . Leg swelling 03/02/2014  . Avascular necrosis of bone of right hip ([place]) 09/27/2013  . Primary osteoarthritis of right hip 09/07/2013  . Spinal stenosis, lumbar region, with neurogenic claudication 09/07/2013  . Closed fracture of pubic ramus ([place]) 09/07/2013  . BPPV (benign paroxysmal positional vertigo) 09/07/2013  . Pharyngeal swelling 08/30/2013  . Pelvic fracture ([place]) 08/28/2013  . Fall 08/26/2013  . Dizziness 08/26/2013  . Abnormal stress test 08/26/2013  . Preop cardiovascular exam 08/01/2013  . Aortic stenosis 08/01/2013  . Edema 06/30/2013  . Loss of weight 01/04/2013  . Situational stress 01/04/2013  . Chronic right hip pain 07/05/2012  . Knee pain, acute 06/29/2012  . Vertigo 06/24/2012  . Acute exacerbation of chronic low back pain 06/24/2012  . Lumbar pain with radiation down right leg 06/09/2012  . Osteoarthritis of hip 04/29/2012  . History of TIAs 04/17/2012  . S/P total hysterectomy and bilateral salpingo-oophorectomy 04/17/2012  . Hard of hearing 04/17/2012  . History of anemia 04/17/2012  . RLS (restless legs syndrome) 04/17/2012  . Chronic back  pain 04/17/2012  . Spinal stenosis, lumbar 04/17/2012  . Lower back pain 12/20/2011  . Personal history of colonic polyps 10/08/2011  . H/O: CVA (cardiovascular accident) 09/14/2011  . Hypertension 09/14/2011  . Restless legs syndrome 09/14/2011  . Constipation 08/20/2011  . Urinary incontinence 08/20/2011    CBC    Component Value Date/Time   WBC 9.2 08/15/2015   WBC 6.0 03/28/2015 1031   RBC 3.82* 03/28/2015 1031   RBC 3.42* 06/28/2014 1205   HGB 9.6* 08/15/2015   HCT 30* 08/15/2015   PLT 302 08/15/2015   MCV 85.0 03/28/2015 1031   LYMPHSABS 1.5 03/28/2015 1031   MONOABS 0.4 03/28/2015 1031   EOSABS 0.1 03/28/2015 1031   BASOSABS 0.0 03/28/2015 1031    CMP     Component Value Date/Time   NA 138 10/05/2015   NA 142 03/28/2015 1031   K 4.5 10/05/2015   CL 101 03/28/2015 1031   CO2 36* 03/28/2015 1031   GLUCOSE 87 03/28/2015 1031   BUN 38* 10/05/2015   BUN 35* 03/28/2015 1031   CREATININE 0.8 10/05/2015   CREATININE 0.88 03/28/2015 1031   CREATININE 0.78 06/28/2014 1205   CALCIUM 9.0 03/28/2015 1031   PROT 5.4* 06/28/2014 1205   ALBUMIN 3.0* 06/28/2014 1205   AST 15 08/15/2015   ALT 8 08/15/2015   ALKPHOS 89 08/15/2015   BILITOT 0.5 06/28/2014 1205   GFRNONAA 70* 09/06/2014 2142   GFRAA 81* 09/06/2014 2142    Assessment and Plan  RLS (restless legs syndrome) No c/o;will continue prn requip  Chronic right hip pain Pt is without pain; pt on MS contin and prn dilaudid ; of course she is addicted but there is nothing to be done in a 82 yo for that  Constipation Pt on scheduled colace and senokot and without problems; cont current meds    Jayani Rozman D. [name], MD

## 2015-11-16 ENCOUNTER — Encounter: Payer: Self-pay | Admitting: Internal Medicine

## 2015-11-19 ENCOUNTER — Non-Acute Institutional Stay (SKILLED_NURSING_FACILITY): Payer: Medicare Other | Admitting: Internal Medicine

## 2015-11-19 ENCOUNTER — Encounter: Payer: Self-pay | Admitting: Internal Medicine

## 2015-11-19 DIAGNOSIS — M160 Bilateral primary osteoarthritis of hip: Secondary | ICD-10-CM

## 2015-11-19 DIAGNOSIS — R609 Edema, unspecified: Secondary | ICD-10-CM | POA: Diagnosis not present

## 2015-11-19 DIAGNOSIS — Z862 Personal history of diseases of the blood and blood-forming organs and certain disorders involving the immune mechanism: Secondary | ICD-10-CM

## 2015-11-19 DIAGNOSIS — I10 Essential (primary) hypertension: Secondary | ICD-10-CM | POA: Diagnosis not present

## 2015-11-19 DIAGNOSIS — G2581 Restless legs syndrome: Secondary | ICD-10-CM | POA: Diagnosis not present

## 2015-11-19 NOTE — Progress Notes (Signed)
Location:    [facility] Home Room Number: 303/D Place of Service:  SNF (31) Provider:  Traci Sermon, MD  Patient Care Team: Lanice Shirts, MD as PCP - General (Internal Medicine) [name], MD as Consulting Physician (Orthopedic Surgery) [name], MD as Consulting Physician (Otolaryngology)  Extended Emergency Contact Information Primary Emergency Contact: [name] Address: [place], [place] [address] [name] of [place] Phone: [phone] Relation: Daughter Secondary Emergency Contact: [place], [place] of [place] Mobile Phone: [phone] Relation: Son  Code Status:  DNR Goals of care: Advanced Directive information Advanced Directives 11/19/2015  Does patient have an advance directive? Yes  Type of Advance Directive Out of facility DNR (pink MOST or yellow form)  Does patient want to make changes to advanced directive? No - Patient declined  Copy of advanced directive(s) in chart? Yes     Chief Complaint  Patient presents with  . Medical Management of Chronic Issues    Routine visit    HPI:  Pt is a 82 y.o. female seen today For medical management of chronic medical issues including osteoarthritis-hypertension-restless legs-constipation-anemia-  Clinically she appears to be doing fairly well and has had period of  stability.  She has a history of chronic pain osteoarthritis pain including hip osteoarthritis-she is on morphine 50 mg twice a day controlled-release routine as well as Dilaudid 2 mg as needed twice a day.  At times she continues to complain of pain however there are concerns for narcotic dependence as noted previously by Dr. [name].  In regards to hypertension this appears stable recent blood pressures 110/68-100/59 she is on Norvasc  5 mg a day and Lasix 40 mg a day  She also has a history of restless leg syndrome is on Requip when  necessary she does not complain of symptoms today in regards to this.  She has complained of constipation in the past she is on Colace and Senokot-she actually had seen GI in November 2016 for complaints of weight loss-they did do a CT scan of the abdomen which did not show any acute process-stool cultures apparently were also done which apparently were negative although I do not see the final results.  At one point she was complaining of diarrhea but she is not complaining of diarrhea today-apparently her MiraLAX was discontinued previously-she appears to be tolerating the  Senokot  In regards to anemia  hemoglobin most recently 9.6 we will update this.  .     Past Medical History  Diagnosis Date  . Hypertension   . Restless legs syndrome     on Requip  . Ulcer causing bleeding and hole in wall of stomach or small intestine   . Gastric ulcer     years ago  . Arthritis     Back, knees  . Constipation   . Urinary bladder calculus   . Urinary incontinence   . Bowel incontinence   . Aortic stenosis   . Stroke ([place])     Mini, no residual  . TIA (transient ischemic attack)   . Colon polyps   . Anemia   . Spinal stenosis   . Vertigo   . Situational stress   . Acute encephalopathy   . Hypokalemia   . AKI (acute kidney injury) ([place])   . Altered mental state    Past Surgical History  Procedure Laterality Date  . Colon resection  50 years ago  . Tonsillectomy    . Uterine fibroid surgery    . Lumbar laminectomy/decompression microdiscectomy  06/25/2011    Procedure: LUMBAR LAMINECTOMY/DECOMPRESSION MICRODISCECTOMY;  Surgeon: Hosie Spangle, MD;  Location: [facility];  Service: Neurosurgery;  Laterality: Right;  RIGHT Lumbar Two-Three hemilaminectomy and microdiskectomy  . Vaginal hysterectomy      Allergies  Allergen Reactions  . Amoxicillin Other (See Comments)    Reaction unknown  . Baclofen Other (See Comments)    "fuzzy in the eyes" and dizzy  . Hctz  [Hydrochlorothiazide]     nausea  . Valium [Diazepam] Other (See Comments)    Pt became unresponsive and O2 sats dropped.      Medication List       This list is accurate as of: 11/19/15  3:39 PM.  Always use your most recent med list.               amLODipine 5 MG tablet  Commonly known as:  NORVASC  Take 5 mg by mouth daily.     cetaphil lotion  Apply to dry skin on body once daily     ENSURE PLUS Liqd  Give one can Chocolate ensure between meals for weight maintenance.     feeding supplement (PRO-STAT SUGAR FREE 64) Liqd  Take 30 mLs by mouth 2 (two) times daily.     furosemide 20 MG tablet  Commonly known as:  LASIX  Take 40 mg by mouth daily.     HYDROmorphone 4 MG tablet  Commonly known as:  DILAUDID  Take 2 mg by mouth 2 (two) times daily as needed for moderate pain or severe pain.     magnesium hydroxide 400 MG/5ML suspension  Commonly known as:  MILK OF MAGNESIA  Take 30 mLs by mouth daily as needed for mild constipation. Reported on 07/27/2015     mirtazapine 15 MG tablet  Commonly known as:  REMERON  Take 7.5 mg by mouth at bedtime.     morphine 15 MG 12 hr tablet  Commonly known as:  MS CONTIN  Give 1 tablet by mouth BID scheduled ( pain ). Give 1 tablet by mouth BID PRN for pain.     multivitamin tablet  Take 1 tablet by mouth daily.     ondansetron 4 MG tablet  Commonly known as:  ZOFRAN  Take 4 mg by mouth every 12 (twelve) hours as needed for nausea or vomiting.     phenol 1.4 % Liqd  Commonly known as:  CHLORASEPTIC  Use as directed 1 spray in the mouth or throat 3 (three) times daily as needed for throat irritation / pain. If sore throat persist notify the MD     potassium chloride 10 MEQ tablet  Commonly known as:  K-DUR  Take 20 mEq by mouth daily.     rOPINIRole 2 MG tablet  Commonly known as:  REQUIP  Take 4 mg by mouth at bedtime.     sennosides-docusate sodium 8.6-50 MG tablet  Commonly known as:  SENOKOT-S  Take 1 tablet by  mouth 2 (two) times daily. Hold for diarrhea        Review of Systems   In general does not complain of fever or chills.  Skin does not complain of rashes or itching does have a chronic sacral ulcer that is followed by wound care-apparently this is stable and thought to be chronic and difficult to fully  heal.  Head ears eyes nose mouth and throat does not complaining of any sore throat or visual changes.  Respiratory does not complain of shortness of breath or cough.  Cardiac does not complain of chest pain has lower extremity edema which appears to be baseline she has compression hose on.  GI does not complain of any nausea or vomiting diarrhea or constipation has had a history of constipation at times.  GU is not complaining of dysuria.  Musculoskeletal continues to complain of diffuse joint pain this appears to be more in the pelvic hip area-again on extensive pain medication as noted above.  Neurologic is not complaining of dizziness or headache.  Psych is not complaining overtly of anxiety or depression has had a period. If stability here-she continues to be pleasant and interactive  Immunization History  Administered Date(s) Administered  . Influenza Split 04/15/2012  . Influenza,inj,Quad PF,36+ Mos 03/06/2014  . PPD Test 08/30/2014, 09/18/2014  . Pneumococcal Polysaccharide-23 08/29/2013   Pertinent  Health Maintenance Due  Topic Date Due  . INFLUENZA VACCINE  08/22/2016 (Originally 12/25/2015)  . PNA vac Low Risk Adult (2 of 2 - PCV13) 08/22/2016 (Originally 08/30/2014)  . DEXA SCAN  05/27/2023 (Originally 06/09/1985)   Fall Risk  06/30/2013 01/04/2013  Falls in the past year? Yes No  Risk for fall due to : Impaired balance/gait;Impaired mobility;Medication side effect -   Functional Status Survey:    Filed Vitals:   11/19/15 1449  BP: 110/68  Pulse: 71  Temp: 98 F (36.7 C)  TempSrc: Oral  Resp: 17  Height: 5' (1.524 m)  Weight: 97 lb 14.4 oz (44.407 kg)    Body mass index is 19.12 kg/(m^2). Physical Exam  Gen. this is a frail elderly female in no distress she is bright and alert.  Her skin is warm and dry.--Has a chronic sacral wound that was not assessed secondary to patient positioning-this is followed closely by wound care provider as well as wound care nurse-per report this is stable and likely chronic  Eyes pupils appear reactive light visual acuity appears grossly intact.  Oropharynx is clear mucous membranes moist.  Chest is clear to auscultation with somewhat shallow air entry there is no labored breathing.  Heart sounds are distant regular rate and rhythm does have a history of a murmur--continues with one plus edema bilaterally she has floaters on  Abdomen is soft nontender with positive bowel sounds-continues to be protuberant but soft.  Musculoskeletal does have arthritic changes most prominently lower extremities--ambulates with a wheelchair and a walker no acute deformities noted.  Neurologic is grossly intact her speech is clear and could not really appreciate lateralizing findings.  Psych she is grossly alert and oriented pleasant and appropriate        Labs reviewed:  Recent Labs  03/28/15 1031 05/14/15 08/15/15 10/05/15  NA 142 120* 141 138  K 4.2 4.2 4.7 4.5  CL 101  --   --   --   CO2 36*  --   --   --   GLUCOSE 87  --   --   --   BUN 35* 38* 34* 38*  CREATININE 0.88 0.9 0.8 0.8  CALCIUM 9.0  --   --   --     Recent Labs  08/15/15  AST 15  ALT 8  ALKPHOS 89    Recent Labs  03/28/15 1031 05/14/15 08/15/15  WBC 6.0 6.3 9.2  NEUTROABS 3.9  --   --   HGB  10.4* 9.2* 9.6*  HCT 32.5* 28* 30*  MCV 85.0  --   --   PLT 250.0 207 302   Lab Results  Component Value Date   TSH 1.438 06/28/2014   Lab Results  Component Value Date   HGBA1C 5.6 06/28/2014   Lab Results  Component Value Date   CHOL 172 03/06/2014   HDL 61 03/06/2014   LDLCALC 88 03/06/2014   TRIG 114 03/06/2014   CHOLHDL  2.8 03/06/2014    Significant Diagnostic Results in last 30 days:  No results found.  Assessment/Plan  \#1 history of osteoarthritis-again continues on MS Contin and when necessary Dilaudid have been hesitant to increase is secondary to concerns of narcotic dependence as noted previously by Dr. [name] she appears to be doing relatively well today ambulating in her wheelchair her vital wrist aware of this.  With this appears stable on Norvasc and Lasix.  #3-history of restless legs she is on when necessary Requip this appears to have stabilized.  #4-history of anemia-this appears to be relatively stable at 9.6 we will update this.  #5-history of constipation-this appears complicated with a history of diarrhea per review of GI note on 04/02/2015-at this point continues on Senokot has milk of magnesia as needed-apparently this is stabilized with no reports recently of profuse diarrhea or constipation but this will have to be watched.  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  #7 weight loss-she is on Remeron apparently this is helping --awaiting updated weights for comparison but apparently this has stabilized-.  #8-edema-this appears to have stabilized-she is on Lasix with potassium will update a metabolic panel to make sure renal function remains stable per chart review she does have a history of acute renal insufficiency although clinically she appears to be doing fairly well today in that regards    [id number]-of note greater than 35 minutes spent assessing patient-discussing her status with nursing staff-reviewing her chart-her labs including GI progress note-and coordinating and formulating a plan of care for numerous  diagnoses-of note greater than 50% of time spent coordinating plan of care       Oralia Manis, [place]

## 2015-11-27 IMAGING — CT CT NECK W/ CM
4 of 6 series · 14 of 33 positions shown, 16 images · IV contrast (omnipaque)
Comparison: Cervical spine CT 08/26/2013

CLINICAL DATA: Neck mass. Prominent vascular structure. Abnormal CT
of the cervical spine 08/26/2013.

EXAM:
CT NECK WITH CONTRAST
TECHNIQUE: Multidetector CT imaging of the neck was performed using the
standard protocol following the bolus administration of intravenous
contrast.
CONTRAST:  75mL OMNIPAQUE IOHEXOL 300 MG/ML  SOLN

[Series 2: axial neck · axial · 0.43mm/px · z∈[-29,+86]mm · 3 of 92 slices shown]
[im 23/92  bone]
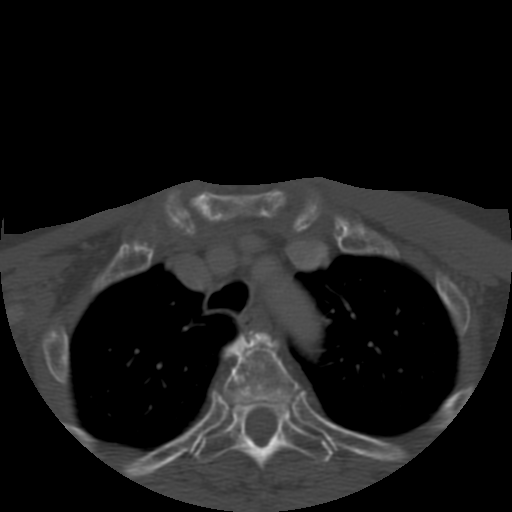
[im 46/92  bone]
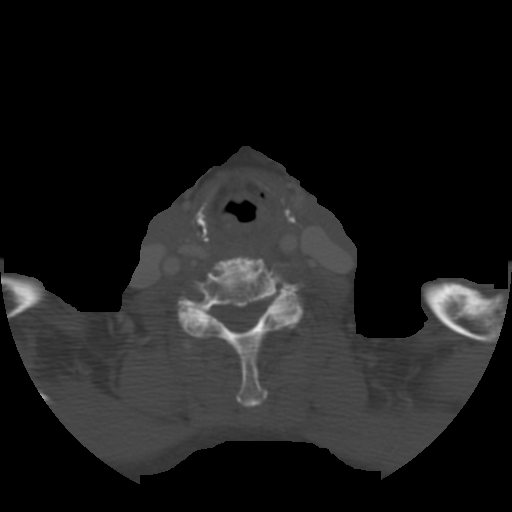
[im 69/92  bone]
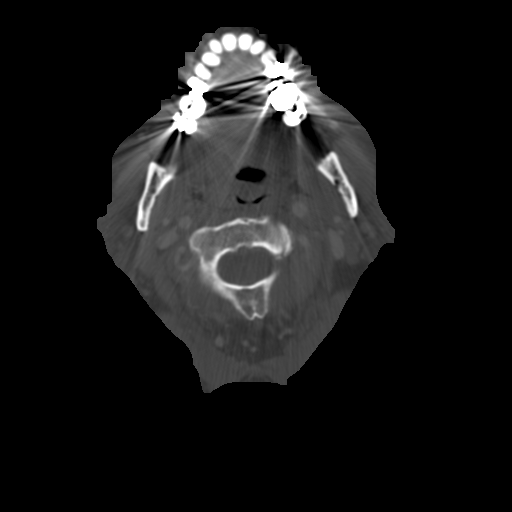

[Series 400: sagittal · sagittal · 0.46mm/px · 5 of 67 slices shown, 6 images]
[im 23/67  bone]
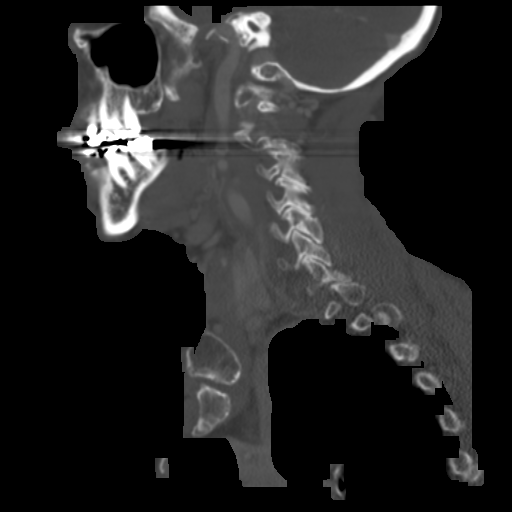
[im 28/67  bone]
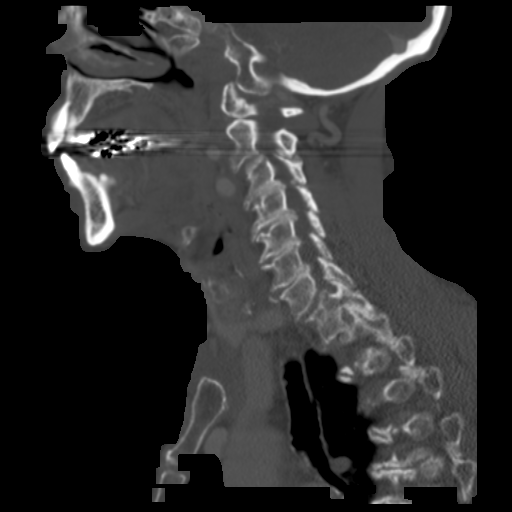
[im 34/67  soft-tissue]
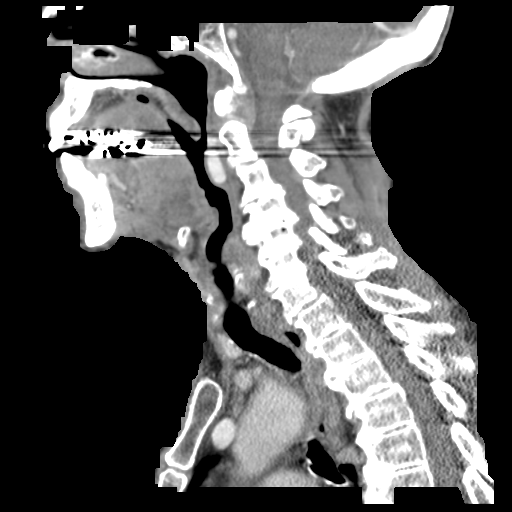
[im 34/67  bone]
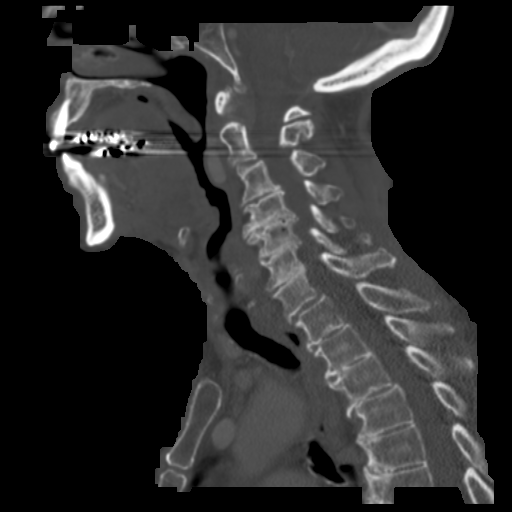
[im 39/67  bone]
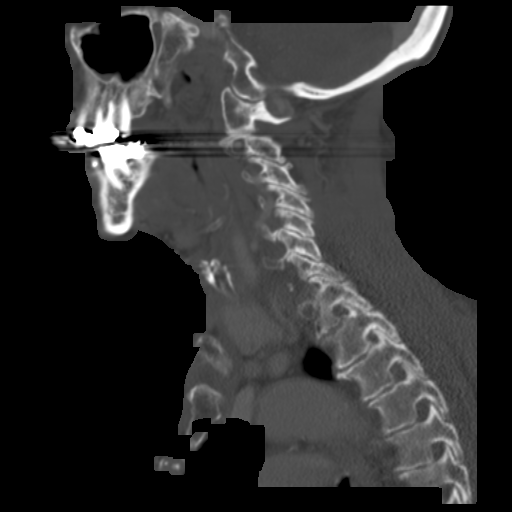
[im 45/67  bone]
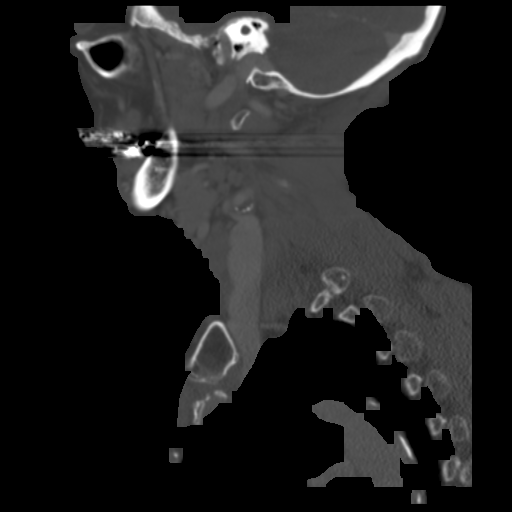

[Series 401: coronal · coronal · 0.46mm/px · 3 of 74 slices shown]
[im 18/74  bone]
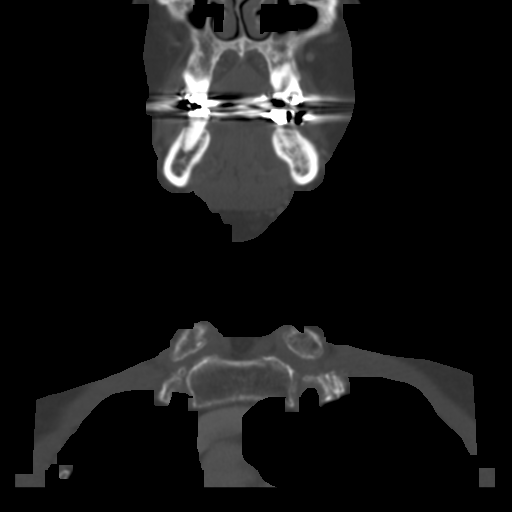
[im 31/74  bone]
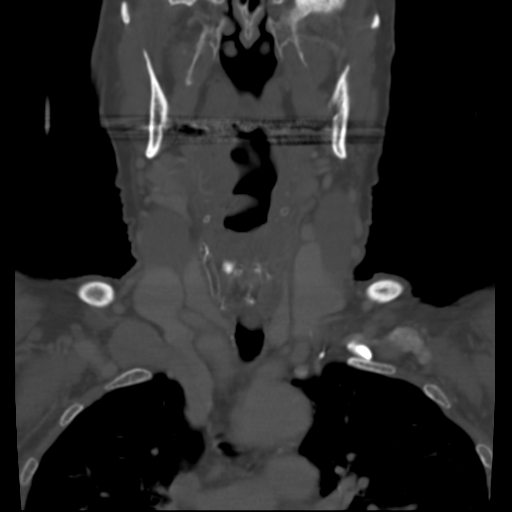
[im 44/74  bone]
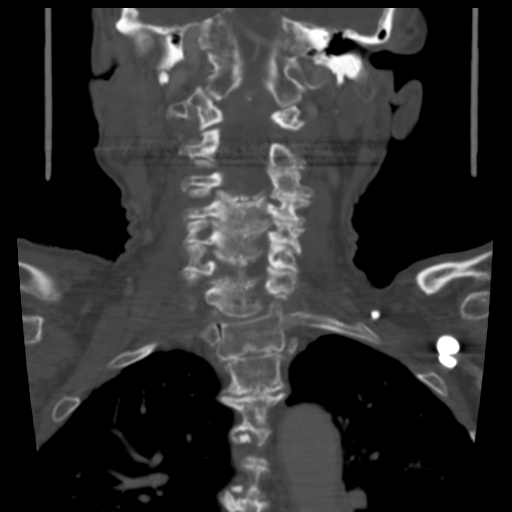

[Series 402: angled axial · axial · 0.46mm/px · z∈[-64,+44]mm · 3 of 96 slices shown, 4 images]
[im 24/96  soft-tissue]
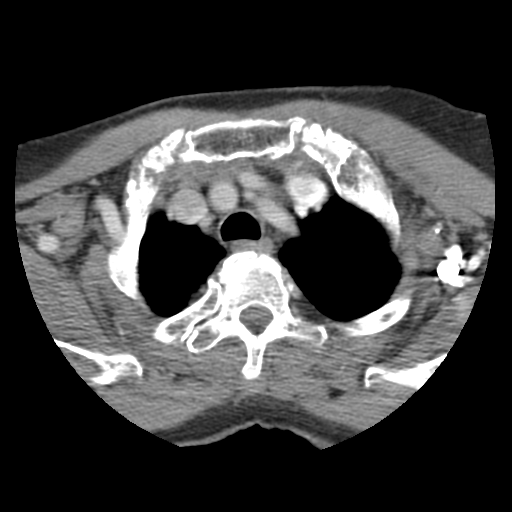
[im 24/96  bone]
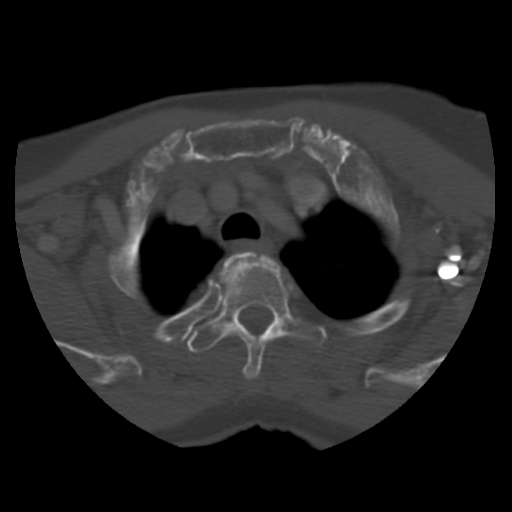
[im 48/96  bone]
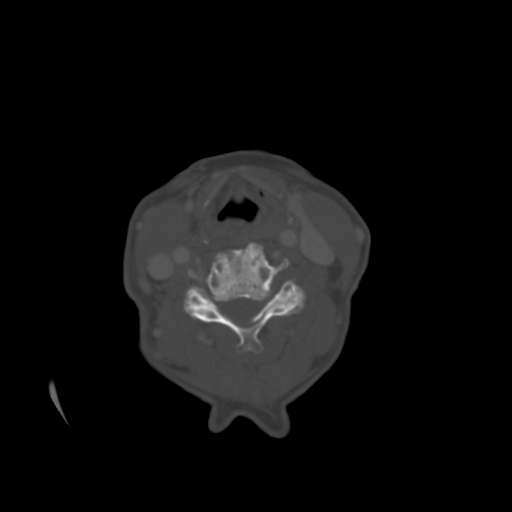
[im 72/96  bone]
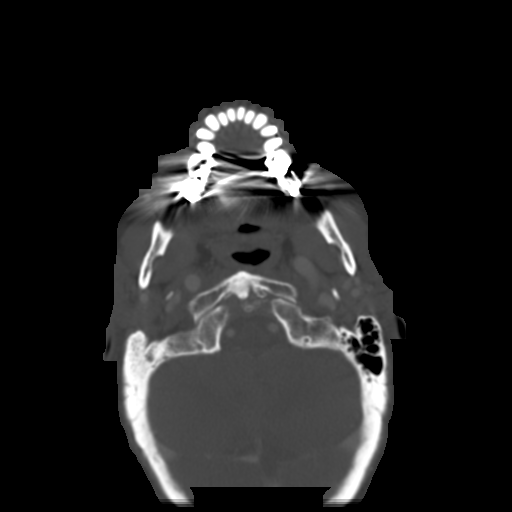

[14 of 33 positions shown; findings below may reference images not displayed]

FINDINGS: There is marked medial deviation of the cervical right internal
carotid artery which accounts for the abnormality identified on the
noncontrast CT of the cervical spine. Atherosclerotic calcifications
are present in the proximal portion of this. No other mass lesion is
present.

No focal mucosal or submucosal lesion is evident. Limited imaging of
the brain is within normal limits. No significant adenopathy is
present.

The lung apices are clear.

Bone windows demonstrate marked multilevel spondylosis of the
cervical spine with uncovertebral spurring and foraminal stenosis
bilaterally at C3-4, C4-5, C5-6, and C6-7. Grade 1 anterolisthesis
and facet hypertrophy is present at C7-T1.
IMPRESSION: 1. Medial deviation and calcification of the proximal internal
carotid artery accounts for the abnormal finding at CT.
2. Bilateral atherosclerotic changes without a significant focal
stenosis.
3. No primary mass lesion in the neck.
4. Moderate spondylosis of the cervical spine.

## 2015-12-14 ENCOUNTER — Non-Acute Institutional Stay (SKILLED_NURSING_FACILITY): Payer: Medicare Other

## 2015-12-14 ENCOUNTER — Non-Acute Institutional Stay: Payer: Self-pay | Admitting: Internal Medicine

## 2015-12-14 DIAGNOSIS — M25551 Pain in right hip: Secondary | ICD-10-CM

## 2015-12-14 DIAGNOSIS — G8929 Other chronic pain: Secondary | ICD-10-CM

## 2015-12-14 DIAGNOSIS — G2581 Restless legs syndrome: Secondary | ICD-10-CM | POA: Diagnosis not present

## 2015-12-14 DIAGNOSIS — K5901 Slow transit constipation: Secondary | ICD-10-CM

## 2015-12-14 NOTE — Progress Notes (Addendum)
MRN: [id number] Name: Shelly Martin  Sex: female Age: 82 y.o. DOB: [birth year]  [place] #:  Facility/Room: [facility] / 303 D Level Of Care: SNF Provider: [name], MD Emergency Contacts: Extended Emergency Contact Information Primary Emergency Contact: [facility] Address: [place], [place] [address] [name] of [place] Phone: [phone] Relation: Daughter Secondary Emergency Contact: [place], [place] of [place] Phone: [phone] Relation: Son  Code Status: DNR  Allergies: Amoxicillin; Baclofen; Hctz; and Valium  Chief Complaint  Patient presents with  . Medical Management of Chronic Issues    Routine Visit    HPI: Patient is 82 y.o. female who is being seen for constipation, chronic hip pain and RLS. Pt is doing really well.  Past Medical History  Diagnosis Date  . Hypertension   . Restless legs syndrome     on Requip  . Ulcer causing bleeding and hole in wall of stomach or small intestine   . Gastric ulcer     years ago  . Arthritis     Back, knees  . Constipation   . Urinary bladder calculus   . Urinary incontinence   . Bowel incontinence   . Aortic stenosis   . Stroke ([place])     Mini, no residual  . TIA (transient ischemic attack)   . Colon polyps   . Anemia   . Spinal stenosis   . Vertigo   . Situational stress   . Acute encephalopathy   . Hypokalemia   . AKI (acute kidney injury) ([place])   . Altered mental state     Past Surgical History  Procedure Laterality Date  . Colon resection  50 years ago  . Tonsillectomy    . Uterine fibroid surgery    . Lumbar laminectomy/decompression microdiscectomy  06/25/2011    Procedure: LUMBAR LAMINECTOMY/DECOMPRESSION MICRODISCECTOMY;  Surgeon: Hosie Spangle, MD;  Location: [facility];  Service: Neurosurgery;  Laterality: Right;  RIGHT Lumbar Two-Three hemilaminectomy and microdiskectomy  . Vaginal hysterectomy         Medication List       This list is accurate as of: 12/14/15  9:02 PM.  Always use your most recent med list.               amLODipine 5 MG tablet  Commonly known as:  NORVASC  Take 5 mg by mouth daily.     cetaphil lotion  Apply to dry skin on body once daily     ENSURE PLUS Liqd  Give one can Chocolate ensure between meals for weight maintenance.     feeding supplement Liqd  Take 1 Container by mouth 2 (two) times daily between meals.     feeding supplement (ENSURE) Pudg  Take 1 Container by mouth daily. Chocolate     feeding supplement (PRO-STAT SUGAR FREE 64) Liqd  Take 30 mLs by mouth 2 (two) times daily.     furosemide 20 MG tablet  Commonly known as:  LASIX  Take 40 mg by mouth daily.     magnesium hydroxide 400 MG/5ML suspension  Commonly known as:  MILK OF MAGNESIA  Take 30 mLs by mouth daily as needed for mild constipation. Reported on 07/27/2015     mirtazapine 15 MG tablet  Commonly known as:  REMERON  Take 7.5 mg by mouth at bedtime.     morphine 15  MG 12 hr tablet  Commonly known as:  MS CONTIN  Give 1 tablet by mouth BID scheduled ( pain ). Give 1 tablet by mouth BID PRN for pain.     multivitamin tablet  Take 1 tablet by mouth daily.     ondansetron 4 MG tablet  Commonly known as:  ZOFRAN  Take 4 mg by mouth every 12 (twelve) hours as needed for nausea or vomiting.     oxyCODONE-acetaminophen 5-325 MG tablet  Commonly known as:  PERCOCET/ROXICET  Take 1 tablet by mouth 3 (three) times daily as needed (for break through pain).     phenol 1.4 % Liqd  Commonly known as:  CHLORASEPTIC  Use as directed 1 spray in the mouth or throat 3 (three) times daily as needed for throat irritation / pain. If sore throat persist notify the MD     potassium chloride 10 MEQ tablet  Commonly known as:  K-DUR  Take 20 mEq by mouth daily.     rOPINIRole 2 MG tablet  Commonly known as:  REQUIP  Take 4 mg by mouth at bedtime.     sennosides-docusate sodium  8.6-50 MG tablet  Commonly known as:  SENOKOT-S  Take 1 tablet by mouth 2 (two) times daily. Hold for diarrhea        Meds ordered this encounter  Medications  . feeding supplement (BOOST / RESOURCE BREEZE) LIQD    Sig: Take 1 Container by mouth 2 (two) times daily between meals.  . feeding supplement, ENSURE, (ENSURE) PUDG    Sig: Take 1 Container by mouth daily. Chocolate  . oxyCODONE-acetaminophen (PERCOCET/ROXICET) 5-325 MG tablet    Sig: Take 1 tablet by mouth 3 (three) times daily as needed (for break through pain).    Immunization History  Administered Date(s) Administered  . Influenza Split 04/15/2012  . Influenza,inj,Quad PF,36+ Mos 03/06/2014  . PPD Test 08/30/2014, 09/18/2014  . Pneumococcal Polysaccharide-23 08/29/2013    Social History  Substance Use Topics  . Smoking status: Never Smoker   . Smokeless tobacco: Never Used  . Alcohol Use: 0.0 oz/week    0 Standard drinks or equivalent per week     Comment: rarely    Review of Systems  DATA OBTAINED: from patient GENERAL:  no fevers, fatigue, appetite changes SKIN: No itching, rash HEENT: No complaint RESPIRATORY: No cough, wheezing, SOB CARDIAC: No chest pain, palpitations, lower extremity edema  GI: No abdominal pain, No N/V/D or constipation, No heartburn or reflux  GU: No dysuria, frequency or urgency, or incontinence  MUSCULOSKELETAL: No unrelieved bone/joint pain NEUROLOGIC: No headache, dizziness  PSYCHIATRIC: No overt anxiety or sadness  Filed Vitals:   12/14/15 1232  BP: 110/59  Pulse: 67  Temp: 99.7 F (37.6 C)  Resp: 16    Physical Exam  GENERAL APPEARANCE: Alert, conversant, No acute distress  SKIN: No diaphoresis rash HEENT: Unremarkable RESPIRATORY: Breathing is even, unlabored. Lung sounds are clear   CARDIOVASCULAR: Heart RRR no murmurs, rubs or gallops. No peripheral edema  GASTROINTESTINAL: Abdomen is soft, non-tender, not distended w/ normal bowel sounds.  GENITOURINARY:  Bladder non tender, not distended  MUSCULOSKELETAL: No abnormal joints or musculature NEUROLOGIC: Cranial nerves 2-12 grossly intact. Moves all extremities PSYCHIATRIC: Mood and affect appropriate to situation, no behavioral issues  Patient Active Problem List   Diagnosis Date Noted  . Narcotic addiction ([place]) 05/15/2015  . Diarrhea 03/30/2015  . Acute pharyngitis 02/26/2015  . Osteoarthritis of right knee 02/22/2015  . Pain in joint,  lower leg 10/31/2014  . Ulcer of sacral region, stage 4 ([place]) 04/15/2014  . Moderate aortic stenosis 04/14/2014  . Pre-syncope 04/14/2014  . Hypotension 04/14/2014  . Acute on chronic renal failure ([place]) 04/14/2014  . Anemia 04/14/2014  . Protein-calorie malnutrition, severe ([place]) 04/14/2014  . Altered mental status   . Dehydration 04/13/2014  . Altered mental state 04/13/2014  . Acute kidney injury ([place])   . Hypokalemia 03/25/2014  . Syncope 03/16/2014  . Acute encephalopathy 03/16/2014  . Leg swelling 03/02/2014  . Avascular necrosis of bone of right hip ([place]) 09/27/2013  . Primary osteoarthritis of right hip 09/07/2013  . Spinal stenosis, lumbar region, with neurogenic claudication 09/07/2013  . Closed fracture of pubic ramus ([place]) 09/07/2013  . BPPV (benign paroxysmal positional vertigo) 09/07/2013  . Pharyngeal swelling 08/30/2013  . Pelvic fracture ([place]) 08/28/2013  . Fall 08/26/2013  . Dizziness 08/26/2013  . Abnormal stress test 08/26/2013  . Preop cardiovascular exam 08/01/2013  . Aortic stenosis 08/01/2013  . Edema 06/30/2013  . Loss of weight 01/04/2013  . Situational stress 01/04/2013  . Chronic right hip pain 07/05/2012  . Knee pain, acute 06/29/2012  . Vertigo 06/24/2012  . Acute exacerbation of chronic low back pain 06/24/2012  . Lumbar pain with radiation down right leg 06/09/2012  . Osteoarthritis of hip 04/29/2012  . History of TIAs 04/17/2012  . S/P total hysterectomy and bilateral salpingo-oophorectomy 04/17/2012  .  Hard of hearing 04/17/2012  . History of anemia 04/17/2012  . RLS (restless legs syndrome) 04/17/2012  . Chronic back pain 04/17/2012  . Spinal stenosis, lumbar 04/17/2012  . Lower back pain 12/20/2011  . Personal history of colonic polyps 10/08/2011  . H/O: CVA (cardiovascular accident) 09/14/2011  . Hypertension 09/14/2011  . Restless legs syndrome 09/14/2011  . Constipation 08/20/2011  . Urinary incontinence 08/20/2011    CBC    Component Value Date/Time   WBC 9.2 08/15/2015   WBC 6.0 03/28/2015 1031   RBC 3.82* 03/28/2015 1031   RBC 3.42* 06/28/2014 1205   HGB 9.6* 08/15/2015   HCT 30* 08/15/2015   PLT 302 08/15/2015   MCV 85.0 03/28/2015 1031   LYMPHSABS 1.5 03/28/2015 1031   MONOABS 0.4 03/28/2015 1031   EOSABS 0.1 03/28/2015 1031   BASOSABS 0.0 03/28/2015 1031    CMP     Component Value Date/Time   NA 138 10/05/2015   NA 142 03/28/2015 1031   K 4.5 10/05/2015   CL 101 03/28/2015 1031   CO2 36* 03/28/2015 1031   GLUCOSE 87 03/28/2015 1031   BUN 38* 10/05/2015   BUN 35* 03/28/2015 1031   CREATININE 0.8 10/05/2015   CREATININE 0.88 03/28/2015 1031   CREATININE 0.78 06/28/2014 1205   CALCIUM 9.0 03/28/2015 1031   PROT 5.4* 06/28/2014 1205   ALBUMIN 3.0* 06/28/2014 1205   AST 15 08/15/2015   ALT 8 08/15/2015   ALKPHOS 89 08/15/2015   BILITOT 0.5 06/28/2014 1205   GFRNONAA 70* 09/06/2014 2142   GFRAA 81* 09/06/2014 2142    Assessment and Plan  RLS (restless legs syndrome) No c/o;will continue prn requip  Chronic right hip pain 2/2 OA; pt appears without pain; pt is seeing a pain clinic now; pt on MS contin and percocet instead of dilaudid which is fabulous  Constipation Pt on scheduled colace and senokot and without problems; cont current meds    Evalyse Stroope D. [name], MD

## 2015-12-20 ENCOUNTER — Encounter: Payer: Self-pay | Admitting: Internal Medicine

## 2015-12-20 NOTE — Progress Notes (Signed)
MRN: [id number] Name: Shelly Martin  Sex: female Age: 82 y.o. DOB: 17-Aug-1918  [place] #:  Facility/Room: [facility] / [address] D Level Of Care: SNF Provider: [name], MD Emergency Contacts: Extended Emergency Contact Information Primary Emergency Contact: [facility] Address: [place], [place] [address] [name] of [place] Phone: [phone] Relation: Daughter Secondary Emergency Contact: [place], [place] of [place] Phone: [phone] Relation: Son  Code Status: DNR  Allergies: Amoxicillin; Baclofen; Hctz; and Valium      Chief Complaint  Patient presents with  . Medical Management of Chronic Issues    Routine Visit    HPI: Patient is 82 y.o. female who is being seen for constipation, chronic hip pain and RLS. Pt is doing really well.       Past Medical History  Diagnosis Date  . Hypertension   . Restless legs syndrome     on Requip  . Ulcer causing bleeding and hole in wall of stomach or small intestine   . Gastric ulcer     years ago  . Arthritis     Back, knees  . Constipation   . Urinary bladder calculus   . Urinary incontinence   . Bowel incontinence   . Aortic stenosis   . Stroke ([place])     Mini, no residual  . TIA (transient ischemic attack)   . Colon polyps   . Anemia   . Spinal stenosis   . Vertigo   . Situational stress   . Acute encephalopathy   . Hypokalemia   . AKI (acute kidney injury) ([place])   . Altered mental state           Past Surgical History  Procedure Laterality Date  . Colon resection  50 years ago  . Tonsillectomy    . Uterine fibroid surgery    . Lumbar laminectomy/decompression microdiscectomy  06/25/2011    Procedure: LUMBAR LAMINECTOMY/DECOMPRESSION MICRODISCECTOMY;  Surgeon: Hosie Spangle, MD;  Location: [facility];  Service: Neurosurgery;  Laterality: Right;  RIGHT Lumbar Two-Three  hemilaminectomy and microdiskectomy  . Vaginal hysterectomy            Medication List       This list is accurate as of: 12/14/15  9:02 PM.  Always use your most recent med list.                amLODipine 5 MG tablet  Commonly known as:  NORVASC  Take 5 mg by mouth daily.     cetaphil lotion  Apply to dry skin on body once daily     ENSURE PLUS Liqd  Give one can Chocolate ensure between meals for weight maintenance.     feeding supplement Liqd  Take 1 Container by mouth 2 (two) times daily between meals.     feeding supplement (ENSURE) Pudg  Take 1 Container by mouth daily. Chocolate     feeding supplement (PRO-STAT SUGAR FREE 64) Liqd  Take 30 mLs by mouth 2 (two) times daily.     furosemide 20 MG tablet  Commonly known as:  LASIX  Take 40 mg by mouth daily.     magnesium hydroxide 400 MG/5ML suspension  Commonly known as:  MILK OF MAGNESIA  Take 30 mLs by mouth daily as needed for mild constipation. Reported on 07/27/2015     mirtazapine 15 MG tablet  Commonly known as:  REMERON  Take 7.5 mg by mouth at bedtime.     morphine 15 MG 12 hr tablet  Commonly known as:  MS CONTIN  Give 1 tablet by mouth BID scheduled ( pain ). Give 1 tablet by mouth BID PRN for pain.     multivitamin tablet  Take 1 tablet by mouth daily.     ondansetron 4 MG tablet  Commonly known as:  ZOFRAN  Take 4 mg by mouth every 12 (twelve) hours as needed for nausea or vomiting.     oxyCODONE-acetaminophen 5-325 MG tablet  Commonly known as:  PERCOCET/ROXICET  Take 1 tablet by mouth 3 (three) times daily as needed (for break through pain).     phenol 1.4 % Liqd  Commonly known as:  CHLORASEPTIC  Use as directed 1 spray in the mouth or throat 3 (three) times daily as needed for throat irritation / pain. If sore throat persist notify the MD     potassium chloride 10 MEQ tablet  Commonly known as:  K-DUR  Take 20 mEq by mouth daily.      rOPINIRole 2 MG tablet  Commonly known as:  REQUIP  Take 4 mg by mouth at bedtime.     sennosides-docusate sodium 8.6-50 MG tablet  Commonly known as:  SENOKOT-S  Take 1 tablet by mouth 2 (two) times daily. Hold for diarrhea            Meds ordered this encounter  Medications  . feeding supplement (BOOST / RESOURCE BREEZE) LIQD    Sig: Take 1 Container by mouth 2 (two) times daily between meals.  . feeding supplement, ENSURE, (ENSURE) PUDG    Sig: Take 1 Container by mouth daily. Chocolate  . oxyCODONE-acetaminophen (PERCOCET/ROXICET) 5-325 MG tablet    Sig: Take 1 tablet by mouth 3 (three) times daily as needed (for break through pain).        Immunization History  Administered Date(s) Administered  . Influenza Split 04/15/2012  . Influenza,inj,Quad PF,36+ Mos 03/06/2014  . PPD Test 08/30/2014, 09/18/2014  . Pneumococcal Polysaccharide-23 08/29/2013          Social History  Substance Use Topics  . Smoking status: Never Smoker   . Smokeless tobacco: Never Used  . Alcohol Use: 0.0 oz/week    0 Standard drinks or equivalent per week     Comment: rarely    Review of Systems  DATA OBTAINED: from patient GENERAL:  no fevers, fatigue, appetite changes SKIN: No itching, rash HEENT: No complaint RESPIRATORY: No cough, wheezing, SOB CARDIAC: No chest pain, palpitations, lower extremity edema  GI: No abdominal pain, No N/V/D or constipation, No heartburn or reflux  GU: No dysuria, frequency or urgency, or incontinence  MUSCULOSKELETAL: No unrelieved bone/joint pain NEUROLOGIC: No headache, dizziness  PSYCHIATRIC: No overt anxiety or sadness     Filed Vitals:   12/14/15 1232  BP: 110/59  Pulse: 67  Temp: 99.7 F (37.6 C)  Resp: 16    Physical Exam  GENERAL APPEARANCE: Alert, conversant, No acute distress  SKIN: No diaphoresis rash HEENT: Unremarkable RESPIRATORY: Breathing is even, unlabored. Lung sounds are clear     CARDIOVASCULAR: Heart RRR no murmurs, rubs or gallops. No peripheral edema  GASTROINTESTINAL: Abdomen is soft, non-tender, not distended w/ normal bowel sounds.  GENITOURINARY: Bladder non tender, not distended  MUSCULOSKELETAL: No abnormal joints or musculature NEUROLOGIC: Cranial nerves 2-12 grossly intact. Moves all extremities PSYCHIATRIC: Mood and affect appropriate to situation, no behavioral issues  Patient Active Problem List   Diagnosis Date Noted  . Narcotic addiction ([place]) 05/15/2015  . Diarrhea 03/30/2015  . Acute pharyngitis 02/26/2015  . Osteoarthritis of right knee 02/22/2015  . Pain in joint, lower leg 10/31/2014  . Ulcer of sacral region, stage 4 ([place]) 04/15/2014  . Moderate aortic stenosis 04/14/2014  . Pre-syncope 04/14/2014  . Hypotension 04/14/2014  . Acute on chronic renal failure ([place]) 04/14/2014  . Anemia 04/14/2014  . Protein-calorie malnutrition, severe ([place]) 04/14/2014  . Altered mental status   . Dehydration 04/13/2014  . Altered mental state 04/13/2014  . Acute kidney injury ([place])   . Hypokalemia 03/25/2014  . Syncope 03/16/2014  . Acute encephalopathy 03/16/2014  . Leg swelling 03/02/2014  . Avascular necrosis of bone of right hip ([place]) 09/27/2013  . Primary osteoarthritis of right hip 09/07/2013  . Spinal stenosis, lumbar region, with neurogenic claudication 09/07/2013  . Closed fracture of pubic ramus ([place]) 09/07/2013  . BPPV (benign paroxysmal positional vertigo) 09/07/2013  . Pharyngeal swelling 08/30/2013  . Pelvic fracture ([place]) 08/28/2013  . Fall 08/26/2013  . Dizziness 08/26/2013  . Abnormal stress test 08/26/2013  . Preop cardiovascular exam 08/01/2013  . Aortic stenosis 08/01/2013  . Edema 06/30/2013  . Loss of weight 01/04/2013  . Situational stress 01/04/2013  . Chronic right hip pain 07/05/2012  . Knee pain, acute 06/29/2012  . Vertigo 06/24/2012  . Acute exacerbation of chronic low back pain 06/24/2012  . Lumbar  pain with radiation down right leg 06/09/2012  . Osteoarthritis of hip 04/29/2012  . History of TIAs 04/17/2012  . S/P total hysterectomy and bilateral salpingo-oophorectomy 04/17/2012  . Hard of hearing 04/17/2012  . History of anemia 04/17/2012  . RLS (restless legs syndrome) 04/17/2012  . Chronic back pain 04/17/2012  . Spinal stenosis, lumbar 04/17/2012  . Lower back pain 12/20/2011  . Personal history of colonic polyps 10/08/2011  . H/O: CVA (cardiovascular accident) 09/14/2011  . Hypertension 09/14/2011  . Restless legs syndrome 09/14/2011  . Constipation 08/20/2011  . Urinary incontinence 08/20/2011    CBC Labs(Brief)          Component Value Date/Time   WBC 9.2 08/15/2015   WBC 6.0 03/28/2015 1031   RBC 3.82* 03/28/2015 1031   RBC 3.42* 06/28/2014 1205   HGB 9.6* 08/15/2015   HCT 30* 08/15/2015   PLT 302 08/15/2015   MCV 85.0 03/28/2015 1031   LYMPHSABS 1.5 03/28/2015 1031   MONOABS 0.4 03/28/2015 1031   EOSABS 0.1 03/28/2015 1031   BASOSABS 0.0 03/28/2015 1031      CMP               Labs(Brief)          Component Value Date/Time   NA 138 10/05/2015   NA 142 03/28/2015 1031   K 4.5 10/05/2015   CL 101 03/28/2015 1031   CO2 36* 03/28/2015 1031   GLUCOSE 87 03/28/2015 1031   BUN 38* 10/05/2015   BUN 35* 03/28/2015 1031   CREATININE 0.8 10/05/2015   CREATININE 0.88 03/28/2015 1031   CREATININE 0.78 06/28/2014 1205   CALCIUM 9.0 03/28/2015 1031   PROT 5.4* 06/28/2014 1205   ALBUMIN 3.0* 06/28/2014 1205   AST 15 08/15/2015   ALT 8 08/15/2015   ALKPHOS 89 08/15/2015   BILITOT 0.5 06/28/2014 1205   GFRNONAA 70* 09/06/2014 2142   GFRAA 81* 09/06/2014 2142      Assessment and Plan  RLS (restless legs syndrome) No c/o;will continue prn requip  Chronic right hip pain 2/2 OA; pt appears without pain; pt is seeing a pain clinic now; pt on MS contin and percocet instead of dilaudid which is  fabulous  Constipation Pt on scheduled colace and senokot and without problems; cont current meds    Webb Silversmith D. [name], MD       Additional Documentation   Vitals:   BP  110/59   Pulse 67   Temp 99.7 F (37.6 C)   Resp 16   Ht 5' (1.524 m)   Wt 98 lb 6.4 oz (44.6 kg)   BMI 19.22 kg/m   BSA 1.37 m   Flowsheets:   Nursing Home Patient Info,   MEWS Score,   Custom Formula Data,   Anthropometrics,   Healthcare Directives   .Marland Kitchen.(3 more)  Encounter Info:   Billing Info,   History,   Allergies,   Detailed Report

## 2015-12-25 ENCOUNTER — Encounter: Payer: Self-pay | Admitting: Internal Medicine

## 2015-12-25 ENCOUNTER — Non-Acute Institutional Stay (SKILLED_NURSING_FACILITY): Payer: Medicare Other | Admitting: Internal Medicine

## 2015-12-25 DIAGNOSIS — M25551 Pain in right hip: Secondary | ICD-10-CM | POA: Diagnosis not present

## 2015-12-25 DIAGNOSIS — G8929 Other chronic pain: Secondary | ICD-10-CM | POA: Diagnosis not present

## 2015-12-25 DIAGNOSIS — M7989 Other specified soft tissue disorders: Secondary | ICD-10-CM | POA: Diagnosis not present

## 2015-12-25 DIAGNOSIS — K5909 Other constipation: Secondary | ICD-10-CM

## 2015-12-25 NOTE — Progress Notes (Signed)
Location:   [facility] Room Number: 303/D Place of Service:  SNF [phone]) Provider:  Traci Sermon, MD  Patient Care Team: Lanice Shirts, MD as PCP - General (Internal Medicine) [name], MD as Consulting Physician (Orthopedic Surgery) [name], MD as Consulting Physician (Otolaryngology)  Extended Emergency Contact Information Primary Emergency Contact: [name] Address: [place], [place] [address] [name] of [place] Phone: [phone] Relation: Daughter Secondary Emergency Contact: [place], [place] of [place] Mobile Phone: [phone] Relation: Son  Code Status:  DNR Goals of care: Advanced Directive information Advanced Directives 12/25/2015  Does patient have an advance directive? Yes  Type of Advance Directive Out of facility DNR (pink MOST or yellow form)  Does patient want to make changes to advanced directive? No - Patient declined  Copy of advanced directive(s) in chart? Yes  Would patient like information on creating an advanced directive? -  Pre-existing out of facility DNR order (yellow form or pink MOST form) -     Chief Complaint  Patient presents with  . Acute Visit    Constipation    HPI:  Pt is a 82 y.o. female seen today for an acute visit for  Follow-up with constipation-patient is on significant narcotic secondary to history of pain including MS Contin 15 mg extended release every 12 hours as well as Percocet 5-3 25 milligrams 3 times a day when necessary for breakthrough pain. She is on Senokot twice a day but according to nurse and refuses this frequently.  Apparently she also has refused anything liquid-she has received enemas apparently with some success but continues to have apparently difficulty having normal bowel movements.  She is not complaining of any abdominal discomfort here nausea or vomiting.  Her main  complaint again appears to be constipation she also at times will complain of pain but she is on significant narcotics and has been seen by outside physician for pain management      Past Medical History:  Diagnosis Date  . Acute encephalopathy   . AKI (acute kidney injury) ([facility])   . Altered mental state   . Anemia   . Aortic stenosis   . Arthritis    Back, knees  . Bowel incontinence   . Colon polyps   . Constipation   . Gastric ulcer    years ago  . Hypertension   . Hypokalemia   . Restless legs syndrome    on Requip  . Situational stress   . Spinal stenosis   . Stroke ([facility])    Mini, no residual  . TIA (transient ischemic attack)   . Ulcer causing bleeding and hole in wall of stomach or small intestine   . Urinary bladder calculus   . Urinary incontinence   . Vertigo    Past Surgical History:  Procedure Laterality Date  . COLON RESECTION  50 years ago  . LUMBAR LAMINECTOMY/DECOMPRESSION MICRODISCECTOMY  06/25/2011   Procedure: LUMBAR LAMINECTOMY/DECOMPRESSION MICRODISCECTOMY;  Surgeon: Hosie Spangle, MD;  Location: [facility];  Service: Neurosurgery;  Laterality: Right;  RIGHT Lumbar Two-Three hemilaminectomy and microdiskectomy  . TONSILLECTOMY    . UTERINE FIBROID SURGERY    . VAGINAL HYSTERECTOMY      Allergies  Allergen Reactions  . Amoxicillin Other (See Comments)    Reaction unknown  . Baclofen Other (See Comments)    "  fuzzy in the eyes" and dizzy  . Hctz [Hydrochlorothiazide]     nausea  . Valium [Diazepam] Other (See Comments)    Pt became unresponsive and O2 sats dropped.      Medication List       Accurate as of 12/25/15  1:47 PM. Always use your most recent med list.          amLODipine 5 MG tablet Commonly known as:  NORVASC Take 5 mg by mouth daily.   cetaphil lotion Apply to dry skin on body once daily   ENSURE PLUS Liqd Give one can Chocolate ensure between meals for weight maintenance.   feeding supplement (PRO-STAT SUGAR  FREE 64) Liqd Take 30 mLs by mouth 2 (two) times daily.   furosemide 20 MG tablet Commonly known as:  LASIX Take 40 mg by mouth daily.   magnesium hydroxide 400 MG/5ML suspension Commonly known as:  MILK OF MAGNESIA Take 30 mLs by mouth daily as needed for mild constipation. Reported on 07/27/2015   mirtazapine 15 MG tablet Commonly known as:  REMERON Take 7.5 mg by mouth at bedtime.   morphine 15 MG 12 hr tablet Commonly known as:  MS CONTIN Give 1 tablet by mouth BID scheduled ( pain ).   multivitamin tablet Take 1 tablet by mouth daily.   ondansetron 4 MG tablet Commonly known as:  ZOFRAN Take 4 mg by mouth every 12 (twelve) hours as needed for nausea or vomiting.   oxyCODONE-acetaminophen 5-325 MG tablet Commonly known as:  PERCOCET/ROXICET Take 1 tablet by mouth 3 (three) times daily as needed (for break through pain).   phenol 1.4 % Liqd Commonly known as:  CHLORASEPTIC Use as directed 1 spray in the mouth or throat 3 (three) times daily as needed for throat irritation / pain. If sore throat persist notify the MD   potassium chloride 10 MEQ tablet Commonly known as:  K-DUR Take 20 mEq by mouth daily.   rOPINIRole 2 MG tablet Commonly known as:  REQUIP Take 4 mg by mouth at bedtime.   sennosides-docusate sodium 8.6-50 MG tablet Commonly known as:  SENOKOT-S Take 1 tablet by mouth 2 (two) times daily. Hold for diarrhea       Review of Systems  DATA OBTAINED: from patient GENERAL: no fevers, fatigue, appetite changes SKIN: No itching, rash HEENT: No complaint RESPIRATORY: No cough, wheezing, SOB CARDIAC: No chest pain, palpitations,  Baseline   lower extremity edema  GI: No abdominal pain---, positive for constipation--- No heartburn or reflux  GU: No dysuria, frequency or urgency, or incontinence  MUSCULOSKELETAL: Can use to complain of some leg pain which is chronic but she does state current pain medication does help NEUROLOGIC: No headache, dizziness    PSYCHIATRIC: No overt anxiety or sadness  Immunization History  Administered Date(s) Administered  . Influenza Split 04/15/2012  . Influenza,inj,Quad PF,36+ Mos 03/06/2014  . PPD Test 08/30/2014, 09/18/2014  . Pneumococcal Polysaccharide-23 08/29/2013   Pertinent  Health Maintenance Due  Topic Date Due  . INFLUENZA VACCINE  08/22/2016 (Originally 12/25/2015)  . PNA vac Low Risk Adult (2 of 2 - PCV13) 08/22/2016 (Originally 08/30/2014)  . DEXA SCAN  05/27/2023 (Originally 06/09/1985)   Fall Risk  06/30/2013 01/04/2013  Falls in the past year? Yes No  Risk for fall due to : Impaired balance/gait;Impaired mobility;Medication side effect -   Functional Status Survey:      Physical Exam She is afebrile pulses 76 respirations 18 blood pressure 143/76  GENERAL  APPEARANCE: Alert, conversant, No acute distress  SKIN: No diaphoresis rash HEENT: Unremarkable RESPIRATORY: Breathing is even, unlabored. Lung sounds are clear somewhat shallow CARDIOVASCULAR: Heart RRR n--with a 2 out of 6 murmur, . Trace-1+ peripheral edema  GASTROINTESTINAL: Abdomen is soft, non-tender, slightly protuberant w/ normal bowel sounds.  GENITOURINARY: Bladder non tender, not distended  MUSCULOSKELETAL: No abnormal joints or musculature-other than arthritic changes-is able to stand unassisted but is quite weak is able to transfer to bed without assistance NEUROLOGIC: Cranial nerves 2-12 grossly intact. Moves all extremities PSYCHIATRIC: Mood and affect appropriate to situation, no behavioral issues  Labs reviewed:  Recent Labs  03/28/15 1031 05/14/15 08/15/15 10/05/15  NA 142 120* 141 138  K 4.2 4.2 4.7 4.5  CL 101  --   --   --   CO2 36*  --   --   --   GLUCOSE 87  --   --   --   BUN 35* 38* 34* 38*  CREATININE 0.88 0.9 0.8 0.8  CALCIUM 9.0  --   --   --     Recent Labs  08/15/15  AST 15  ALT 8  ALKPHOS 89    Recent Labs  03/28/15 1031 05/14/15 08/15/15  WBC 6.0 6.3 9.2  NEUTROABS 3.9  --    --   HGB 10.4* 9.2* 9.6*  HCT 32.5* 28* 30*  MCV 85.0  --   --   PLT 250.0 207 302   Lab Results  Component Value Date   TSH 1.438 06/28/2014   Lab Results  Component Value Date   HGBA1C 5.6 06/28/2014   Lab Results  Component Value Date   CHOL 172 03/06/2014   HDL 61 03/06/2014   LDLCALC 88 03/06/2014   TRIG 114 03/06/2014   CHOLHDL 2.8 03/06/2014    Significant Diagnostic Results in last 30 days:  No results found.  Assessment/Plan  #1 constipation-again she is apparently refusing Senokot fairly frequently will start Linzess145 g daily give her an 30 minutes before first meal to see if this is effective.  Regards to pain management will not alter this she is already seen at the pain clinic and they have follow this closely her pain appears to be relatively well controlled-  will check her electrolytes with her history constipation see where her potassium is will check a BMP also will check a C BC for updated values area  She does have a history of anemia.  In regards to the edema this appears stable she is on Lasix with potassium supplementation again will update a metabolic panel.  [place], [place], [place]

## 2015-12-27 ENCOUNTER — Encounter: Payer: Self-pay | Admitting: Internal Medicine

## 2015-12-27 ENCOUNTER — Non-Acute Institutional Stay (SKILLED_NURSING_FACILITY): Payer: Medicare Other | Admitting: Internal Medicine

## 2015-12-27 DIAGNOSIS — R194 Change in bowel habit: Secondary | ICD-10-CM | POA: Diagnosis not present

## 2015-12-27 DIAGNOSIS — K5900 Constipation, unspecified: Secondary | ICD-10-CM

## 2015-12-27 NOTE — Progress Notes (Signed)
Location:   [facility] Room Number: 303/D Place of Service:  SNF [phone]) Provider:  Traci Sermon, MD  Patient Care Team: Lanice Shirts, MD as PCP - General (Internal Medicine) [name], MD as Consulting Physician (Orthopedic Surgery) [name], MD as Consulting Physician (Otolaryngology)  Extended Emergency Contact Information Primary Emergency Contact: [name] Address: [place], [place] [address] [name] of [place] Phone: [phone] Relation: Daughter Secondary Emergency Contact: [place], [place] of [place] Mobile Phone: [phone] Relation: Son  Code Status:  DNR Goals of care: Advanced Directive information Advanced Directives 12/27/2015  Does patient have an advance directive? Yes  Type of Advance Directive Out of facility DNR (pink MOST or yellow form)  Does patient want to make changes to advanced directive? No - Patient declined  Copy of advanced directive(s) in chart? Yes  Would patient like information on creating an advanced directive? -  Pre-existing out of facility DNR order (yellow form or pink MOST form) -     Chief Complaint  Patient presents with  . Acute Visit    Patients c/o Says she has been vomiting up something brown    HPI:  Pt is a 82 y.o. female seen today for an acute visit for Following up of question vomiting episode yesterday.  However, with patient she states she did not vomit but this was more a bowel movement that she had--she denies any bleeding says it was somewhat watery but did have brown fecal matter.  She states she had some mild abdominal discomfort apparently before this but has had none since and feels fine today.  There've been no further episodes she denies any nausea or vomiting.  She was seen earlier this week with complaints of constipation she was started on Linzess-- She is also on Senokot  twice a day although apparently she refuses this often  She does continue on significant narcotics including MS Contin 50 mg extended release every 12 hours until his Percocet 3 times a day when necessary   Past Medical History:  Diagnosis Date  . Acute encephalopathy   . AKI (acute kidney injury) ([facility])   . Altered mental state   . Anemia   . Aortic stenosis   . Arthritis    Back, knees  . Bowel incontinence   . Colon polyps   . Constipation   . Gastric ulcer    years ago  . Hypertension   . Hypokalemia   . Restless legs syndrome    on Requip  . Situational stress   . Spinal stenosis   . Stroke ([facility])    Mini, no residual  . TIA (transient ischemic attack)   . Ulcer causing bleeding and hole in wall of stomach or small intestine   . Urinary bladder calculus   . Urinary incontinence   . Vertigo    Past Surgical History:  Procedure Laterality Date  . COLON RESECTION  50 years ago  . LUMBAR LAMINECTOMY/DECOMPRESSION MICRODISCECTOMY  06/25/2011   Procedure: LUMBAR LAMINECTOMY/DECOMPRESSION MICRODISCECTOMY;  Surgeon: Hosie Spangle, MD;  Location: [facility];  Service: Neurosurgery;  Laterality: Right;  RIGHT Lumbar Two-Three hemilaminectomy and microdiskectomy  . TONSILLECTOMY    . UTERINE FIBROID SURGERY    . VAGINAL HYSTERECTOMY      Allergies  Allergen Reactions  . Amoxicillin Other (See Comments)  Reaction unknown  . Baclofen Other (See Comments)    "fuzzy in the eyes" and dizzy  . Hctz [Hydrochlorothiazide]     nausea  . Valium [Diazepam] Other (See Comments)    Pt became unresponsive and O2 sats dropped.      Medication List       Accurate as of 12/27/15 11:12 AM. Always use your most recent med list.          amLODipine 5 MG tablet Commonly known as:  NORVASC Take 5 mg by mouth daily.   cetaphil lotion Apply to dry skin on body once daily   feeding supplement (PRO-STAT SUGAR FREE 64) Liqd Take 30 mLs by mouth 2 (two) times daily.     feeding supplement Liqd Take 1 Container by mouth 2 (two) times daily between meals.   feeding supplement (ENSURE) Pudg Take 1 Container by mouth daily. For weight maintenance. Give chocolate   furosemide 20 MG tablet Commonly known as:  LASIX Take 40 mg by mouth daily.   magnesium hydroxide 400 MG/5ML suspension Commonly known as:  MILK OF MAGNESIA Take 30 mLs by mouth daily as needed for mild constipation. Reported on 07/27/2015   mirtazapine 15 MG tablet Commonly known as:  REMERON Take 7.5 mg by mouth at bedtime.   morphine 15 MG 12 hr tablet Commonly known as:  MS CONTIN Give 1 tablet by mouth BID scheduled ( pain ).   multivitamin tablet Take 1 tablet by mouth daily.   ondansetron 4 MG tablet Commonly known as:  ZOFRAN Take 4 mg by mouth every 12 (twelve) hours as needed for nausea or vomiting.   oxyCODONE-acetaminophen 5-325 MG tablet Commonly known as:  PERCOCET/ROXICET Take 1 tablet by mouth 3 (three) times daily as needed (for break through pain).   phenol 1.4 % Liqd Commonly known as:  CHLORASEPTIC Use as directed 1 spray in the mouth or throat 3 (three) times daily as needed for throat irritation / pain. If sore throat persist notify the MD   potassium chloride 10 MEQ tablet Commonly known as:  K-DUR Take 20 mEq by mouth daily.   rOPINIRole 2 MG tablet Commonly known as:  REQUIP Take 4 mg by mouth at bedtime.   sennosides-docusate sodium 8.6-50 MG tablet Commonly known as:  SENOKOT-S Take 1 tablet by mouth 2 (two) times daily. Hold for diarrhea       Review of Systems  DATA OBTAINED: from patient GENERAL: no fevers, fatigue, appetite changes says she ate breakfast this morning SKIN: No itching, rash HEENT: No complaint RESPIRATORY: No cough, wheezing, SOB CARDIAC: No chest pain, palpitations,  Baseline   lower extremity edema  GI: No abdominal pain---,-- No heartburn or reflux --question abnormal bowel movement yesterday as noted above--denies  vomiting or nausea GU: Has somewhat chronic urinary urgency MUSCULOSKELETAL:  Has chronic complaints of intermittent hip and leg pain it appears recent pain medications have helped-thought to have some element of possible narcotic dependency NEUROLOGIC: No headache, dizziness  PSYCHIATRIC: No overt anxiety or sadness  Immunization History  Administered Date(s) Administered  . Influenza Split 04/15/2012  . Influenza,inj,Quad PF,36+ Mos 03/06/2014  . PPD Test 08/30/2014, 09/18/2014  . Pneumococcal Polysaccharide-23 08/29/2013   Pertinent  Health Maintenance Due  Topic Date Due  . INFLUENZA VACCINE  08/22/2016 (Originally 12/25/2015)  . PNA vac Low Risk Adult (2 of 2 - PCV13) 08/22/2016 (Originally 08/30/2014)  . DEXA SCAN  05/27/2023 (Originally 06/09/1985)   Fall Risk  06/30/2013 01/04/2013  Falls  in the past year? Yes No  Risk for fall due to : Impaired balance/gait;Impaired mobility;Medication side effect -   Functional Status Survey:    Vitals:   12/27/15 1110  BP: (!) 115/97  Pulse: 79  Resp: 16  Temp: 98.7 F (37.1 C)  TempSrc: Oral  SpO2: 97%   There is no height or weight on file to calculate BMI. Physical Exam      GENERAL APPEARANCE: Alert, conversant, No acute distress  SKIN: No diaphoresis rash HEENT: Unremarkable this membranes moist oropharynx clear RESPIRATORY: Breathing is even, unlabored. Lung sounds are clear  shallow CARDIOVASCULAR: Heart RRR n--with a 2 out of 6 murmur, . Trace-1+ peripheral edema  GASTROINTESTINAL: Abdomen is soft, non-tender, slightly protuberant w/ normal bowel sounds  Rectal-I could not appreciate any impaction on digital exam occult blood testing appears to be negative.  GENITOURINARY: Bladder non tender, not distended  MUSCULOSKELETAL: No abnormal joints or musculature-other than arthritic changes-is able to stand unassisted but is quite weak is able to transfer to bed without assistance NEUROLOGIC: Cranial nerves 2-12 grossly  intact. Moves all extremities PSYCHIATRIC: Mood and affect appropriate to situation, no behavioral issues  Labs reviewed:  Recent Labs  03/28/15 1031 05/14/15 08/15/15 10/05/15  NA 142 120* 141 138  K 4.2 4.2 4.7 4.5  CL 101  --   --   --   CO2 36*  --   --   --   GLUCOSE 87  --   --   --   BUN 35* 38* 34* 38*  CREATININE 0.88 0.9 0.8 0.8  CALCIUM 9.0  --   --   --     Recent Labs  08/15/15  AST 15  ALT 8  ALKPHOS 89    Recent Labs  03/28/15 1031 05/14/15 08/15/15  WBC 6.0 6.3 9.2  NEUTROABS 3.9  --   --   HGB 10.4* 9.2* 9.6*  HCT 32.5* 28* 30*  MCV 85.0  --   --   PLT 250.0 207 302   Lab Results  Component Value Date   TSH 1.438 06/28/2014   Lab Results  Component Value Date   HGBA1C 5.6 06/28/2014   Lab Results  Component Value Date   CHOL 172 03/06/2014   HDL 61 03/06/2014   LDLCALC 88 03/06/2014   TRIG 114 03/06/2014   CHOLHDL 2.8 03/06/2014    Significant Diagnostic Results in last 30 days:  No results found.  Assessment/Plan . #1-question abnormal bowel movement yesterday-this is really kind of hard to fully appreciate what occurred-however she appears to be fine today is 970 abdominal pain diarrhea or nausea or vomiting-at this point will monitor and continue current medications includingLinzess and monitor for changes.  I also have ordered updated CBC and CMP will see if this can be drawn tomorrow for updated values Also C. Difficile  Culture  any loose stools.  At this point continue to monitor although she appears to be essentially at her baseline and comfortable today.  [place], [place], [place]

## 2015-12-31 LAB — BASIC METABOLIC PANEL
BUN: 21 mg/dL (ref 4–21)
Creatinine: 0.9 mg/dL (ref 0.5–1.1)
Glucose: 99 mg/dL
Potassium: 4.4 mmol/L (ref 3.4–5.3)
SODIUM: 138 mmol/L (ref 137–147)

## 2015-12-31 LAB — HEPATIC FUNCTION PANEL
ALK PHOS: 99 U/L (ref 25–125)
ALT: 10 U/L (ref 7–35)
AST: 17 U/L (ref 13–35)
BILIRUBIN, TOTAL: 0.6 mg/dL

## 2015-12-31 LAB — CBC AND DIFFERENTIAL
HEMATOCRIT: 32 % — AB (ref 36–46)
Hemoglobin: 10.2 g/dL — AB (ref 12.0–16.0)
PLATELETS: 310 10*3/uL (ref 150–399)
WBC: 7.6 10^3/mL

## 2016-01-04 ENCOUNTER — Non-Acute Institutional Stay (SKILLED_NURSING_FACILITY): Payer: Medicare Other | Admitting: Internal Medicine

## 2016-01-04 ENCOUNTER — Encounter: Payer: Self-pay | Admitting: Internal Medicine

## 2016-01-04 DIAGNOSIS — I10 Essential (primary) hypertension: Secondary | ICD-10-CM

## 2016-01-04 DIAGNOSIS — G8929 Other chronic pain: Secondary | ICD-10-CM

## 2016-01-04 DIAGNOSIS — M25551 Pain in right hip: Secondary | ICD-10-CM

## 2016-01-04 DIAGNOSIS — K5901 Slow transit constipation: Secondary | ICD-10-CM | POA: Diagnosis not present

## 2016-01-04 NOTE — Progress Notes (Signed)
MRN: [id number] Name: Shelly Martin  Sex: female Age: 82 y.o. DOB: [birth year]  [place] #:  Facility/Room: [facility] / 303 D Level Of Care: SNF Provider: [name], MD Emergency Contacts: Extended Emergency Contact Information Primary Emergency Contact: [facility] Address: [place], [place] [address] [name] of [place] Phone: [phone] Relation: Daughter Secondary Emergency Contact: Wellman, Alma of [place] Phone: [phone] Relation: Son  Code Status: DNR   Allergies: Amoxicillin; Baclofen; Hctz [hydrochlorothiazide]; and Valium [diazepam]  Chief Complaint  Patient presents with  . Medical Management of Chronic Issues    Routine Visit    HPI: Patient is 82 y.o. female who is being seen for routine issues of HTN, vconstipation and chronic R hip pain.  Past Medical History:  Diagnosis Date  . Acute encephalopathy   . AKI (acute kidney injury) ([place])   . Altered mental state   . Anemia   . Aortic stenosis   . Arthritis    Back, knees  . Bowel incontinence   . Colon polyps   . Constipation   . Gastric ulcer    years ago  . Hypertension   . Hypokalemia   . Restless legs syndrome    on Requip  . Situational stress   . Spinal stenosis   . Stroke ([place])    Mini, no residual  . TIA (transient ischemic attack)   . Ulcer causing bleeding and hole in wall of stomach or small intestine   . Urinary bladder calculus   . Urinary incontinence   . Vertigo     Past Surgical History:  Procedure Laterality Date  . COLON RESECTION  50 years ago  . LUMBAR LAMINECTOMY/DECOMPRESSION MICRODISCECTOMY  06/25/2011   Procedure: LUMBAR LAMINECTOMY/DECOMPRESSION MICRODISCECTOMY;  Surgeon: Hosie Spangle, MD;  Location: [facility];  Service: Neurosurgery;  Laterality: Right;  RIGHT Lumbar Two-Three hemilaminectomy and microdiskectomy  . TONSILLECTOMY    . UTERINE FIBROID SURGERY    .  VAGINAL HYSTERECTOMY        Medication List       Accurate as of 01/04/16  9:19 AM. Always use your most recent med list.          amLODipine 5 MG tablet Commonly known as:  NORVASC Take 5 mg by mouth daily.   cetaphil lotion Apply to dry skin on body once daily   feeding supplement (PRO-STAT SUGAR FREE 64) Liqd Take 30 mLs by mouth 2 (two) times daily.   feeding supplement Liqd Take 1 Container by mouth 2 (two) times daily between meals.   furosemide 20 MG tablet Commonly known as:  LASIX Take 40 mg by mouth daily.   magnesium hydroxide 400 MG/5ML suspension Commonly known as:  MILK OF MAGNESIA Take 30 mLs by mouth daily as needed for mild constipation. Reported on 07/27/2015   mirtazapine 15 MG tablet Commonly known as:  REMERON Take 7.5 mg by mouth at bedtime.   morphine 15 MG 12 hr tablet Commonly known as:  MS CONTIN Take 15 mg by mouth 2 (two) times daily.   multivitamin tablet Take 1 tablet by mouth daily.   ondansetron 4 MG tablet Commonly known as:  ZOFRAN Take 4 mg by mouth every 12 (twelve) hours as needed for nausea or vomiting.   oxyCODONE-acetaminophen 5-325 MG tablet Commonly known as:  PERCOCET/ROXICET Take 1 tablet by mouth  3 (three) times daily as needed. Breakthrough pain   phenol 1.4 % Liqd Commonly known as:  CHLORASEPTIC Use as directed 1 spray in the mouth or throat 3 (three) times daily as needed for throat irritation / pain. If sore throat persist notify the MD   potassium chloride 10 MEQ tablet Commonly known as:  K-DUR Take 20 mEq by mouth daily.   rOPINIRole 2 MG tablet Commonly known as:  REQUIP Take 4 mg by mouth at bedtime.   SANTYL ointment Generic drug:  collagenase Apply 1 application topically daily. Apply nickel thick layer to wound on sacrum   sennosides-docusate sodium 8.6-50 MG tablet Commonly known as:  SENOKOT-S Take 1 tablet by mouth 2 (two) times daily. Hold for diarrhea       Meds ordered this  encounter  Medications  . oxyCODONE-acetaminophen (PERCOCET/ROXICET) 5-325 MG tablet    Sig: Take 1 tablet by mouth 3 (three) times daily as needed. Breakthrough pain  . morphine (MS CONTIN) 15 MG 12 hr tablet    Sig: Take 15 mg by mouth 2 (two) times daily.  . collagenase (SANTYL) ointment    Sig: Apply 1 application topically daily. Apply nickel thick layer to wound on sacrum    Immunization History  Administered Date(s) Administered  . Influenza Split 04/15/2012  . Influenza,inj,Quad PF,36+ Mos 03/06/2014  . PPD Test 08/30/2014, 09/18/2014  . Pneumococcal Polysaccharide-23 08/29/2013    Social History  Substance Use Topics  . Smoking status: Never Smoker  . Smokeless tobacco: Never Used  . Alcohol use 0.0 oz/week     Comment: rarely    Review of Systems  DATA OBTAINED: from patient, nurse, medical record, family member GENERAL:  no fevers, fatigue, appetite changes SKIN: No itching, rash HEENT: No complaint RESPIRATORY: No cough, wheezing, SOB CARDIAC: No chest pain, palpitations, lower extremity edema  GI: No abdominal pain, No N/V/D or constipation, No heartburn or reflux  GU: No dysuria, frequency or urgency, or incontinence  MUSCULOSKELETAL: No unrelieved bone/joint pain NEUROLOGIC: No headache, dizziness  PSYCHIATRIC: No overt anxiety or sadness  Vitals:   01/04/16 0906  BP: (!) 110/59  Pulse: 66  Resp: 16  Temp: 97.8 F (36.6 C)    Physical Exam  GENERAL APPEARANCE: Alert, conversant, No acute distress  SKIN: No diaphoresis rash HEENT: Unremarkable RESPIRATORY: Breathing is even, unlabored. Lung sounds are clear   CARDIOVASCULAR: Heart RRR no murmurs, rubs or gallops. No peripheral edema  GASTROINTESTINAL: Abdomen is soft, non-tender, not distended w/ normal bowel sounds.  GENITOURINARY: Bladder non tender, not distended  MUSCULOSKELETAL: No abnormal joints or musculature NEUROLOGIC: Cranial nerves 2-12 grossly intact. Moves all  extremities PSYCHIATRIC: Mood and affect appropriate to situation, no behavioral issues  Patient Active Problem List   Diagnosis Date Noted  . Narcotic addiction ([place]) 05/15/2015  . Diarrhea 03/30/2015  . Acute pharyngitis 02/26/2015  . Osteoarthritis of right knee 02/22/2015  . Pain in joint, lower leg 10/31/2014  . Ulcer of sacral region, stage 4 ([place]) 04/15/2014  . Moderate aortic stenosis 04/14/2014  . Pre-syncope 04/14/2014  . Hypotension 04/14/2014  . Acute on chronic renal failure ([place]) 04/14/2014  . Anemia 04/14/2014  . Protein-calorie malnutrition, severe ([place]) 04/14/2014  . Altered mental status   . Dehydration 04/13/2014  . Altered mental state 04/13/2014  . Acute kidney injury ([place])   . Hypokalemia 03/25/2014  . Syncope 03/16/2014  . Acute encephalopathy 03/16/2014  . Leg swelling 03/02/2014  . Avascular necrosis of bone of right hip (  [place]) 09/27/2013  . Primary osteoarthritis of right hip 09/07/2013  . Spinal stenosis, lumbar region, with neurogenic claudication 09/07/2013  . Closed fracture of pubic ramus ([place]) 09/07/2013  . BPPV (benign paroxysmal positional vertigo) 09/07/2013  . Pharyngeal swelling 08/30/2013  . Pelvic fracture ([place]) 08/28/2013  . Fall 08/26/2013  . Dizziness 08/26/2013  . Abnormal stress test 08/26/2013  . Preop cardiovascular exam 08/01/2013  . Aortic stenosis 08/01/2013  . Edema 06/30/2013  . Loss of weight 01/04/2013  . Situational stress 01/04/2013  . Chronic right hip pain 07/05/2012  . Knee pain, acute 06/29/2012  . Vertigo 06/24/2012  . Acute exacerbation of chronic low back pain 06/24/2012  . Lumbar pain with radiation down right leg 06/09/2012  . Osteoarthritis of hip 04/29/2012  . History of TIAs 04/17/2012  . S/P total hysterectomy and bilateral salpingo-oophorectomy 04/17/2012  . Hard of hearing 04/17/2012  . History of anemia 04/17/2012  . RLS (restless legs syndrome) 04/17/2012  . Chronic back pain 04/17/2012  .  Spinal stenosis, lumbar 04/17/2012  . Lower back pain 12/20/2011  . Personal history of colonic polyps 10/08/2011  . H/O: CVA (cardiovascular accident) 09/14/2011  . Hypertension 09/14/2011  . Restless legs syndrome 09/14/2011  . Constipation 08/20/2011  . Urinary incontinence 08/20/2011    CBC    Component Value Date/Time   WBC 9.2 08/15/2015   WBC 6.0 03/28/2015 1031   RBC 3.82 (L) 03/28/2015 1031   HGB 9.6 (A) 08/15/2015   HCT 30 (A) 08/15/2015   PLT 302 08/15/2015   MCV 85.0 03/28/2015 1031   LYMPHSABS 1.5 03/28/2015 1031   MONOABS 0.4 03/28/2015 1031   EOSABS 0.1 03/28/2015 1031   BASOSABS 0.0 03/28/2015 1031    CMP     Component Value Date/Time   NA 138 10/05/2015   K 4.5 10/05/2015   CL 101 03/28/2015 1031   CO2 36 (H) 03/28/2015 1031   GLUCOSE 87 03/28/2015 1031   BUN 38 (A) 10/05/2015   CREATININE 0.8 10/05/2015   CREATININE 0.88 03/28/2015 1031   CREATININE 0.78 06/28/2014 1205   CALCIUM 9.0 03/28/2015 1031   PROT 5.4 (L) 06/28/2014 1205   ALBUMIN 3.0 (L) 06/28/2014 1205   AST 15 08/15/2015   ALT 8 08/15/2015   ALKPHOS 89 08/15/2015   BILITOT 0.5 06/28/2014 1205   GFRNONAA 70 (L) 09/06/2014 2142   GFRAA 81 (L) 09/06/2014 2142    Assessment and Plan  HTN - Good control on norvasc 5 mg and lasix 40 mg daily;plan - cont current meds  CONSTIPATION -Pt now on senokot -s BID and linzess 145 mcg daily with good control;cont current meds  CHRONIC R HIP PAIN 2/2 AVASCULAR NECROSIS-Chronic; pt was referred to pain clinic and they have decreased her medications for pain and she has done well with a good attitude; plan - cont MS Contin 15 mg q 12 amd percocet 5/325 po TID prn     No problem-specific Assessment & Plan notes found for this encounter.   [name], MD

## 2016-01-08 ENCOUNTER — Inpatient Hospital Stay (HOSPITAL_COMMUNITY)
Admission: EM | Admit: 2016-01-08 | Discharge: 2016-01-15 | DRG: 388 | Disposition: A | Discharge status: Skilled Nursing Facility | Payer: Medicare Other | Attending: Family Medicine | Admitting: Family Medicine

## 2016-01-08 ENCOUNTER — Encounter (HOSPITAL_COMMUNITY): Payer: Self-pay

## 2016-01-08 ENCOUNTER — Emergency Department (HOSPITAL_COMMUNITY): Payer: Medicare Other

## 2016-01-08 DIAGNOSIS — E43 Unspecified severe protein-calorie malnutrition: Secondary | ICD-10-CM | POA: Diagnosis present

## 2016-01-08 DIAGNOSIS — K529 Noninfective gastroenteritis and colitis, unspecified: Secondary | ICD-10-CM | POA: Diagnosis present

## 2016-01-08 DIAGNOSIS — R188 Other ascites: Secondary | ICD-10-CM | POA: Diagnosis present

## 2016-01-08 DIAGNOSIS — Z681 Body mass index (BMI) 19 or less, adult: Secondary | ICD-10-CM | POA: Diagnosis not present

## 2016-01-08 DIAGNOSIS — G8929 Other chronic pain: Secondary | ICD-10-CM | POA: Diagnosis present

## 2016-01-08 DIAGNOSIS — Z8673 Personal history of transient ischemic attack (TIA), and cerebral infarction without residual deficits: Secondary | ICD-10-CM

## 2016-01-08 DIAGNOSIS — Z66 Do not resuscitate: Secondary | ICD-10-CM | POA: Diagnosis present

## 2016-01-08 DIAGNOSIS — M5430 Sciatica, unspecified side: Secondary | ICD-10-CM | POA: Diagnosis present

## 2016-01-08 DIAGNOSIS — Z8711 Personal history of peptic ulcer disease: Secondary | ICD-10-CM

## 2016-01-08 DIAGNOSIS — I350 Nonrheumatic aortic (valve) stenosis: Secondary | ICD-10-CM

## 2016-01-08 DIAGNOSIS — J9811 Atelectasis: Secondary | ICD-10-CM | POA: Diagnosis present

## 2016-01-08 DIAGNOSIS — Z800 Family history of malignant neoplasm of digestive organs: Secondary | ICD-10-CM | POA: Diagnosis not present

## 2016-01-08 DIAGNOSIS — I10 Essential (primary) hypertension: Secondary | ICD-10-CM | POA: Diagnosis present

## 2016-01-08 DIAGNOSIS — L89151 Pressure ulcer of sacral region, stage 1: Secondary | ICD-10-CM | POA: Diagnosis present

## 2016-01-08 DIAGNOSIS — K5660 Unspecified intestinal obstruction: Secondary | ICD-10-CM | POA: Diagnosis present

## 2016-01-08 DIAGNOSIS — Z86010 Personal history of colonic polyps: Secondary | ICD-10-CM

## 2016-01-08 DIAGNOSIS — H9190 Unspecified hearing loss, unspecified ear: Secondary | ICD-10-CM | POA: Diagnosis present

## 2016-01-08 DIAGNOSIS — F1120 Opioid dependence, uncomplicated: Secondary | ICD-10-CM | POA: Diagnosis present

## 2016-01-08 DIAGNOSIS — R1011 Right upper quadrant pain: Secondary | ICD-10-CM | POA: Diagnosis present

## 2016-01-08 DIAGNOSIS — R32 Unspecified urinary incontinence: Secondary | ICD-10-CM | POA: Diagnosis present

## 2016-01-08 DIAGNOSIS — E46 Unspecified protein-calorie malnutrition: Secondary | ICD-10-CM | POA: Diagnosis present

## 2016-01-08 DIAGNOSIS — T40605A Adverse effect of unspecified narcotics, initial encounter: Secondary | ICD-10-CM | POA: Diagnosis present

## 2016-01-08 DIAGNOSIS — K5669 Other intestinal obstruction: Secondary | ICD-10-CM | POA: Diagnosis not present

## 2016-01-08 DIAGNOSIS — K56609 Unspecified intestinal obstruction, unspecified as to partial versus complete obstruction: Secondary | ICD-10-CM | POA: Diagnosis present

## 2016-01-08 DIAGNOSIS — L8990 Pressure ulcer of unspecified site, unspecified stage: Secondary | ICD-10-CM | POA: Insufficient documentation

## 2016-01-08 DIAGNOSIS — Z888 Allergy status to other drugs, medicaments and biological substances status: Secondary | ICD-10-CM

## 2016-01-08 DIAGNOSIS — Z823 Family history of stroke: Secondary | ICD-10-CM

## 2016-01-08 DIAGNOSIS — E876 Hypokalemia: Secondary | ICD-10-CM | POA: Diagnosis not present

## 2016-01-08 DIAGNOSIS — G2581 Restless legs syndrome: Secondary | ICD-10-CM | POA: Diagnosis present

## 2016-01-08 DIAGNOSIS — K5903 Drug induced constipation: Secondary | ICD-10-CM | POA: Diagnosis present

## 2016-01-08 DIAGNOSIS — Z79899 Other long term (current) drug therapy: Secondary | ICD-10-CM

## 2016-01-08 DIAGNOSIS — Z880 Allergy status to penicillin: Secondary | ICD-10-CM

## 2016-01-08 LAB — CBC WITH DIFFERENTIAL/PLATELET
BASOS PCT: 0 %
Basophils Absolute: 0 10*3/uL (ref 0.0–0.1)
Eosinophils Absolute: 0 10*3/uL (ref 0.0–0.7)
Eosinophils Relative: 0 %
HEMATOCRIT: 39.2 % (ref 36.0–46.0)
HEMOGLOBIN: 12.2 g/dL (ref 12.0–15.0)
LYMPHS PCT: 5 %
Lymphs Abs: 1.3 10*3/uL (ref 0.7–4.0)
MCH: 26.9 pg (ref 26.0–34.0)
MCHC: 31.1 g/dL (ref 30.0–36.0)
MCV: 86.5 fL (ref 78.0–100.0)
MONOS PCT: 4 %
Monocytes Absolute: 1.1 10*3/uL — ABNORMAL HIGH (ref 0.1–1.0)
NEUTROS ABS: 24 10*3/uL — AB (ref 1.7–7.7)
Neutrophils Relative %: 91 %
Platelets: 336 10*3/uL (ref 150–400)
RBC: 4.53 MIL/uL (ref 3.87–5.11)
RDW: 15.1 % (ref 11.5–15.5)
WBC: 26.4 10*3/uL — ABNORMAL HIGH (ref 4.0–10.5)

## 2016-01-08 LAB — COMPREHENSIVE METABOLIC PANEL
ALBUMIN: 4.4 g/dL (ref 3.5–5.0)
ALK PHOS: 109 U/L (ref 38–126)
ALT: 15 U/L (ref 14–54)
ANION GAP: 11 (ref 5–15)
AST: 21 U/L (ref 15–41)
BILIRUBIN TOTAL: 0.5 mg/dL (ref 0.3–1.2)
BUN: 53 mg/dL — ABNORMAL HIGH (ref 6–20)
CALCIUM: 9 mg/dL (ref 8.9–10.3)
CO2: 25 mmol/L (ref 22–32)
CREATININE: 1.13 mg/dL — AB (ref 0.44–1.00)
Chloride: 102 mmol/L (ref 101–111)
GFR calc Af Amer: 46 mL/min — ABNORMAL LOW (ref >60)
GFR calc non Af Amer: 40 mL/min — ABNORMAL LOW (ref >60)
GLUCOSE: 170 mg/dL — AB (ref 65–99)
Potassium: 4.6 mmol/L (ref 3.5–5.1)
Sodium: 138 mmol/L (ref 135–145)
TOTAL PROTEIN: 7.6 g/dL (ref 6.5–8.1)

## 2016-01-08 LAB — URINALYSIS, ROUTINE W REFLEX MICROSCOPIC
Bilirubin Urine: NEGATIVE
GLUCOSE, UA: NEGATIVE mg/dL
HGB URINE DIPSTICK: NEGATIVE
Ketones, ur: NEGATIVE mg/dL
Nitrite: NEGATIVE
PH: 5 (ref 5.0–8.0)
PROTEIN: 30 mg/dL — AB
SPECIFIC GRAVITY, URINE: 1.023 (ref 1.005–1.030)

## 2016-01-08 LAB — I-STAT CG4 LACTIC ACID, ED: Lactic Acid, Venous: 2.79 mmol/L (ref 0.5–1.9)

## 2016-01-08 LAB — URINE MICROSCOPIC-ADD ON

## 2016-01-08 LAB — LIPASE, BLOOD: Lipase: 101 U/L — ABNORMAL HIGH (ref 11–51)

## 2016-01-08 MED ORDER — IOPAMIDOL (ISOVUE-300) INJECTION 61%
100.0000 mL | Freq: Once | INTRAVENOUS | Status: AC | PRN
  Administered 2016-01-08: 75 mL via INTRAVENOUS

## 2016-01-08 MED ORDER — SODIUM CHLORIDE 0.9 % IV SOLN
500.0000 mg | Freq: Two times a day (BID) | INTRAVENOUS | Status: DC
  Administered 2016-01-08 – 2016-01-10 (×4): 500 mg via INTRAVENOUS
  Filled 2016-01-08 (×5): qty 0.5

## 2016-01-08 MED ORDER — ONDANSETRON HCL 4 MG/2ML IJ SOLN
4.0000 mg | Freq: Four times a day (QID) | INTRAMUSCULAR | Status: DC | PRN
  Administered 2016-01-08 – 2016-01-15 (×6): 4 mg via INTRAVENOUS
  Filled 2016-01-08 (×7): qty 2

## 2016-01-08 MED ORDER — PHENOL 1.4 % MT LIQD
1.0000 | OROMUCOSAL | Status: DC | PRN
  Filled 2016-01-08: qty 177

## 2016-01-08 MED ORDER — ONDANSETRON HCL 4 MG/2ML IJ SOLN
4.0000 mg | Freq: Once | INTRAMUSCULAR | Status: AC
  Administered 2016-01-08: 4 mg via INTRAVENOUS
  Filled 2016-01-08: qty 2

## 2016-01-08 MED ORDER — CHLORHEXIDINE GLUCONATE 0.12 % MT SOLN
15.0000 mL | Freq: Two times a day (BID) | OROMUCOSAL | Status: DC
  Administered 2016-01-08 – 2016-01-15 (×14): 15 mL via OROMUCOSAL
  Filled 2016-01-08 (×10): qty 15

## 2016-01-08 MED ORDER — FENTANYL CITRATE (PF) 100 MCG/2ML IJ SOLN
50.0000 ug | Freq: Once | INTRAMUSCULAR | Status: AC
Start: 2016-01-08 — End: 2016-01-08
  Administered 2016-01-08: 50 ug via INTRAVENOUS
  Filled 2016-01-08: qty 2

## 2016-01-08 MED ORDER — HYDRALAZINE HCL 20 MG/ML IJ SOLN
10.0000 mg | Freq: Four times a day (QID) | INTRAMUSCULAR | Status: DC | PRN
  Administered 2016-01-08 – 2016-01-14 (×9): 10 mg via INTRAVENOUS
  Filled 2016-01-08 (×9): qty 1

## 2016-01-08 MED ORDER — MORPHINE SULFATE (PF) 2 MG/ML IV SOLN
2.0000 mg | INTRAVENOUS | Status: DC | PRN
  Administered 2016-01-09 – 2016-01-15 (×8): 2 mg via INTRAVENOUS
  Filled 2016-01-08 (×8): qty 1

## 2016-01-08 MED ORDER — ALBUTEROL SULFATE (2.5 MG/3ML) 0.083% IN NEBU: 2.5000 mg | INHALATION_SOLUTION | RESPIRATORY_TRACT | Status: DC | PRN

## 2016-01-08 MED ORDER — SODIUM CHLORIDE 0.9 % IV SOLN
INTRAVENOUS | Status: AC
  Administered 2016-01-08: 16:00:00 via INTRAVENOUS
  Administered 2016-01-08: 50 mL/h via INTRAVENOUS
  Administered 2016-01-09 (×2): via INTRAVENOUS

## 2016-01-08 MED ORDER — HEPARIN SODIUM (PORCINE) 5000 UNIT/ML IJ SOLN
5000.0000 [IU] | Freq: Three times a day (TID) | INTRAMUSCULAR | Status: DC
  Administered 2016-01-08: 5000 [IU] via SUBCUTANEOUS
  Filled 2016-01-08: qty 1

## 2016-01-08 MED ORDER — SODIUM CHLORIDE 0.9 % IV SOLN
500.0000 mg | Freq: Once | INTRAVENOUS | Status: AC
  Administered 2016-01-08: 500 mg via INTRAVENOUS
  Filled 2016-01-08: qty 0.5

## 2016-01-08 MED ORDER — SODIUM CHLORIDE 0.9 % IV BOLUS (SEPSIS)
1000.0000 mL | Freq: Once | INTRAVENOUS | Status: AC
  Administered 2016-01-08: 1000 mL via INTRAVENOUS

## 2016-01-08 NOTE — ED Notes (Signed)
Pt complains of nausea and vomiting since about 9pm, pt states that she didn't eat anything out of the ordinary

## 2016-01-08 NOTE — ED Notes (Signed)
pts daughter arrived at bedside and is requesting information about her mother.

## 2016-01-08 NOTE — Consult Note (Signed)
Reason for Consult: SBO possible enteritis PPC:  Dr. Serita Sheller Referring Physician: DR. Suzie Portela  Shelly Martin is an 82 y.o. female.  HPI: 82 year old presents about 4 AM with nausea and vomiting since 9 PM. Symptoms were acute, she has never had this before.  She lives at [facility], and I can't tell if she is in the SNF or a Assisted living situation.  She says staff there prescribe her chronic pain meds for sciatica. She normally has issues with constipation.  Yesterday she had acute onset of nausea, vomiting, and loose stool.  She also reports urinary incontinence she cannot control.  Workup on admission shows she is afebrile blood pressure is elevated.  Lab shows a BUN of 53 a creatinine of 1.13 glucose is 170 lactate is 2.79. WBC is 26.4 hemoglobin 12.2 hematocrit 39.2 platelets 336,000. Left shift with 91% neutrophils. Urine analysis was not impressive, culture is pending.  CT scan shows atelectasis in both lung bases. Small esophageal hiatal hernia. Distended fluid-filled small bowel loops are present terminal ileum is decompressed distal jejunum demonstrates fold thickening.  There is mesenteric edema small amount ascites changes suggest small bowel obstruction transition zone is not localized but appears to be in the central pelvis. All thickening may indicate presence of enteritis no free air post operative changes anterior abdominal wall surgical sutures present. We are asked to see.  Past Medical History:  Diagnosis Date  . Acute encephalopathy   . AKI (acute kidney injury) ([place])   . Altered mental state   . Anemia   . Aortic stenosis   . Arthritis    Back, knees  . Bowel incontinence   . Colon polyps   . Constipation   . Gastric ulcer    years ago  . Hypertension   . Hypokalemia   . Restless legs syndrome    on Requip  . Situational stress   . Spinal stenosis   . Stroke ([place])    Mini, no residual  . TIA (transient ischemic attack)   . Ulcer causing bleeding and  hole in wall of stomach or small intestine   . Urinary bladder calculus   . Urinary incontinence   . Vertigo     Past Surgical History:  Procedure Laterality Date  . COLON RESECTION  50 years ago  . LUMBAR LAMINECTOMY/DECOMPRESSION MICRODISCECTOMY  06/25/2011   Procedure: LUMBAR LAMINECTOMY/DECOMPRESSION MICRODISCECTOMY;  Surgeon: Hosie Spangle, MD;  Location: [facility];  Service: Neurosurgery;  Laterality: Right;  RIGHT Lumbar Two-Three hemilaminectomy and microdiskectomy  . TONSILLECTOMY    . UTERINE FIBROID SURGERY    . VAGINAL HYSTERECTOMY      Family History  Problem Relation Age of Onset  . Dementia Mother   . Stroke Father   . Pancreatic cancer Brother   . Anesthesia problems Neg Hx   . Colon cancer Neg Hx   . Esophageal cancer Neg Hx   . Stomach cancer Neg Hx   . Diabetes Neg Hx   . Kidney disease Neg Hx   . Liver disease Neg Hx     Social History:  reports that she has never smoked. She has never used smokeless tobacco. She reports that she drinks alcohol. She reports that she does not use drugs.  Allergies:  Allergies  Allergen Reactions  . Amoxicillin Other (See Comments)    Reaction unknown  . Baclofen Other (See Comments)    "fuzzy in the eyes" and dizzy  . Hctz [Hydrochlorothiazide]  nausea  . Valium [Diazepam] Other (See Comments)    Pt became unresponsive and O2 sats dropped.   Prior to Admission medications   Medication Sig Start Date End Date Taking? Authorizing Provider  Amino Acids-Protein Hydrolys (FEEDING SUPPLEMENT, PRO-STAT SUGAR FREE 64,) LIQD Take 30 mLs by mouth 2 (two) times daily.   Yes Historical Provider, MD  amLODipine (NORVASC) 5 MG tablet Take 5 mg by mouth daily.   Yes Historical Provider, MD  feeding supplement (BOOST / RESOURCE BREEZE) LIQD Take 1 Container by mouth 2 (two) times daily between meals.   Yes Historical Provider, MD  furosemide (LASIX) 20 MG tablet Take 40 mg by mouth daily.    Yes Historical Provider, MD   linaclotide (LINZESS) 145 MCG CAPS capsule Take 145 mcg by mouth daily before breakfast.   Yes Historical Provider, MD  magnesium hydroxide (MILK OF MAGNESIA) 400 MG/5ML suspension Take 30 mLs by mouth daily as needed for mild constipation. Reported on 07/27/2015   Yes Historical Provider, MD  mirtazapine (REMERON) 15 MG tablet Take 7.5 mg by mouth at bedtime.    Yes Historical Provider, MD  morphine (MS CONTIN) 15 MG 12 hr tablet Take 15 mg by mouth 2 (two) times daily.   Yes Historical Provider, MD  Multiple Vitamin (MULTIVITAMIN) tablet Take 1 tablet by mouth daily.   Yes Historical Provider, MD  ondansetron (ZOFRAN) 4 MG tablet Take 4 mg by mouth every 12 (twelve) hours as needed for nausea or vomiting.   Yes Historical Provider, MD  oxyCODONE-acetaminophen (PERCOCET/ROXICET) 5-325 MG tablet Take 1 tablet by mouth 3 (three) times daily as needed. Breakthrough pain   Yes Historical Provider, MD  phenol (CHLORASEPTIC) 1.4 % LIQD Use as directed 1 spray in the mouth or throat 3 (three) times daily as needed for throat irritation / pain. If sore throat persist notify the MD   Yes Historical Provider, MD  potassium chloride (K-DUR) 10 MEQ tablet Take 20 mEq by mouth daily.    Yes Historical Provider, MD  rOPINIRole (REQUIP) 2 MG tablet Take 4 mg by mouth at bedtime.   Yes Historical Provider, MD  sennosides-docusate sodium (SENOKOT-S) 8.6-50 MG tablet Take 1 tablet by mouth 2 (two) times daily. Hold for diarrhea   Yes Historical Provider, MD     Results for orders placed or performed during the hospital encounter of 01/08/16 (from the past 48 hour(s))  CBC with Differential/Platelet     Status: Abnormal   Collection Time: 01/08/16  4:12 AM  Result Value Ref Range   WBC 26.4 (H) 4.0 - 10.5 K/uL   RBC 4.53 3.87 - 5.11 MIL/uL   Hemoglobin 12.2 12.0 - 15.0 g/dL   HCT 39.2 36.0 - 46.0 %   MCV 86.5 78.0 - 100.0 fL   MCH 26.9 26.0 - 34.0 pg   MCHC 31.1 30.0 - 36.0 g/dL   RDW 15.1 11.5 - 15.5 %    Platelets 336 150 - 400 K/uL   Neutrophils Relative % 91 %   Lymphocytes Relative 5 %   Monocytes Relative 4 %   Eosinophils Relative 0 %   Basophils Relative 0 %   Neutro Abs 24.0 (H) 1.7 - 7.7 K/uL   Lymphs Abs 1.3 0.7 - 4.0 K/uL   Monocytes Absolute 1.1 (H) 0.1 - 1.0 K/uL   Eosinophils Absolute 0.0 0.0 - 0.7 K/uL   Basophils Absolute 0.0 0.0 - 0.1 K/uL   Smear Review MORPHOLOGY UNREMARKABLE   Comprehensive metabolic panel  Status: Abnormal   Collection Time: 01/08/16  4:12 AM  Result Value Ref Range   Sodium 138 135 - 145 mmol/L   Potassium 4.6 3.5 - 5.1 mmol/L   Chloride 102 101 - 111 mmol/L   CO2 25 22 - 32 mmol/L   Glucose, Bld 170 (H) 65 - 99 mg/dL   BUN 53 (H) 6 - 20 mg/dL   Creatinine, Ser 0.51 (H) 0.44 - 1.00 mg/dL   Calcium 9.0 8.9 - 10.2 mg/dL   Total Protein 7.6 6.5 - 8.1 g/dL   Albumin 4.4 3.5 - 5.0 g/dL   AST 21 15 - 41 U/L   ALT 15 14 - 54 U/L   Alkaline Phosphatase 109 38 - 126 U/L   Total Bilirubin 0.5 0.3 - 1.2 mg/dL   GFR calc non Af Amer 40 (L) >60 mL/min   GFR calc Af Amer 46 (L) >60 mL/min    Comment: (NOTE) The eGFR has been calculated using the CKD EPI equation. This calculation has not been validated in all clinical situations. eGFR's persistently <60 mL/min signify possible Chronic Kidney Disease.    Anion gap 11 5 - 15  Lipase, blood     Status: Abnormal   Collection Time: 01/08/16  4:12 AM  Result Value Ref Range   Lipase 101 (H) 11 - 51 U/L  Urinalysis, Routine w reflex microscopic (not at [facility])     Status: Abnormal   Collection Time: 01/08/16  4:38 AM  Result Value Ref Range   Color, Urine YELLOW YELLOW   APPearance CLEAR CLEAR   Specific Gravity, Urine 1.023 1.005 - 1.030   pH 5.0 5.0 - 8.0   Glucose, UA NEGATIVE NEGATIVE mg/dL   Hgb urine dipstick NEGATIVE NEGATIVE   Bilirubin Urine NEGATIVE NEGATIVE   Ketones, ur NEGATIVE NEGATIVE mg/dL   Protein, ur 30 (A) NEGATIVE mg/dL   Nitrite NEGATIVE NEGATIVE   Leukocytes, UA SMALL (A)  NEGATIVE  Urine microscopic-add on     Status: Abnormal   Collection Time: 01/08/16  4:38 AM  Result Value Ref Range   Squamous Epithelial / LPF 0-5 (A) NONE SEEN   WBC, UA 0-5 0 - 5 WBC/hpf   RBC / HPF 0-5 0 - 5 RBC/hpf   Bacteria, UA FEW (A) NONE SEEN  I-Stat CG4 Lactic Acid, ED     Status: Abnormal   Collection Time: 01/08/16  5:02 AM  Result Value Ref Range   Lactic Acid, Venous 2.79 (HH) 0.5 - 1.9 mmol/L   Comment NOTIFIED PHYSICIAN     Ct Abdomen Pelvis W Contrast  Result Date: 01/08/2016 CLINICAL DATA:  Nausea and vomiting since 01/07/2016. Elevated white cell count at 26.4. Elevated lipase at 101. EXAM: CT ABDOMEN AND PELVIS WITH CONTRAST TECHNIQUE: Multidetector CT imaging of the abdomen and pelvis was performed using the standard protocol following bolus administration of intravenous contrast. CONTRAST:  29mL ISOVUE-300 IOPAMIDOL (ISOVUE-300) INJECTION 61% COMPARISON:  04/02/2015 FINDINGS: Atelectasis in the lung bases. Coronary artery calcifications. Small esophageal hiatal hernia. Tiny sub cm low-attenuation lesions in the liver similar to prior study likely representing small cysts. Cholelithiasis with single stone in the gallbladder. No gallbladder wall edema or infiltration. No bile duct dilatation. Pancreas appears somewhat atrophic and there is mild pancreatic ductal dilatation. This is similar to previous study and likely relates to chronic pancreatitis. No definite acute infiltration. The spleen, adrenal glands, kidneys, inferior vena cava, and retroperitoneal lymph nodes are unremarkable. Calcification of the aorta without aneurysm. Distended fluid-filled small  bowel loops are present. Terminal ileum is decompressed. Distal jejunal loops demonstrate fold thickening. There is mesenteric edema and a small amount of ascites. Changes suggest small bowel obstruction. Transition zone is not distinctly localized but appears to be in the central pelvis. Wall thickening may indicate the  presence of enteritis as well. Inflammatory cause for obstruction should be considered. No free air. Postoperative changes in the anterior abdominal wall with surgical sutures present. Pelvis: Small amount of free fluid in the pelvis. Uterus is surgically absent. Bladder wall is not thickened. No pelvic mass or lymphadenopathy. Degenerative changes in the spine and hips. No destructive bone lesions. IMPRESSION: Distended fluid-filled small bowel with small bowel fold thickening distally, mesenteric edema, and small amount of ascites. Changes likely represent small bowel obstruction possibly with associated enteritis. Transition zone appears to be in the low pelvis. Cause is not determined but consider inflammatory etiologies. Electronically Signed   By: Burman Nieves M.D.   On: 01/08/2016 06:51    Review of Systems  Constitutional: Positive for weight loss (she isn't sure, but ongoing wt loss over last few years.). Negative for chills, diaphoresis, fever and malaise/fatigue.  HENT: Negative for congestion, ear discharge, ear pain, hearing loss (hard of hearing), nosebleeds, sore throat and tinnitus.   Eyes: Negative.   Respiratory: Negative.  Negative for stridor.   Cardiovascular: Positive for claudication (she walks with walker) and leg swelling. Negative for chest pain, palpitations, orthopnea and PND.  Gastrointestinal: Positive for constipation, diarrhea, nausea and vomiting. Negative for abdominal pain and blood in stool.  Genitourinary: Positive for dysuria, frequency and urgency.  Musculoskeletal: Positive for back pain and joint pain.       She has sciatica and cannot remember how long she has been on chronic morphine for her pain.  [facility] at [facility].  Skin: Negative.   Neurological: Negative.  Negative for weakness.  Endo/Heme/Allergies: Bruises/bleeds easily.  Psychiatric/Behavioral: Positive for depression and memory loss.   Blood pressure 173/68, pulse 95, temperature  98.3 F (36.8 C), temperature source Oral, resp. rate 18, SpO2 94 %. Physical Exam  Constitutional: She is oriented to person, place, and time.  Frail cachectic woman still having nausea and vomiting.  HENT:  Head: Normocephalic and atraumatic.  Nose: Nose normal.  Eyes: Right eye exhibits no discharge. Left eye exhibits no discharge. No scleral icterus.  Neck: No JVD present. No tracheal deviation present. No thyromegaly present.  Bruits both necks, but I think it is from the AS  Cardiovascular: Normal rate and regular rhythm.   Murmur (Aortic stenosis) heard. I did not feel pulses in feet  Respiratory: Effort normal and breath sounds normal. No respiratory distress. She has no wheezes. She has no rales. She exhibits no tenderness.  GI: Soft. She exhibits distension. She exhibits no mass. There is no tenderness. There is no rebound and no guarding.  Soft, no bowel sounds.  She is thin and you can feel loops of distended bowel lower abodomen.  Still vomiting some when I WAS IN THE ROOM.  Lower abdominal mid line incision below umbilicus.  Musculoskeletal: She exhibits edema (trace).  Lymphadenopathy:    She has no cervical adenopathy.  Neurological: She is alert and oriented to person, place, and time. No cranial nerve deficit.  Skin: Skin is warm and dry. No rash noted. No erythema. No pallor.  Psychiatric: She has a normal mood and affect. Her behavior is normal. Judgment and thought content normal.    Assessment/Plan: Acute  onset of nausea, vomiting, diarrhea, and some urinary symptoms SBO on CT with possible enteritis Hx of abdominal hysterectomy (40 years ago - best guess) Hx of chronic pain on MS contin and percocet for some time (sciatica) Hx of chronic constipation Aortic stenosis Deconditioning and malnutrition Restless leg Some memory issues  Plan:  Agree with admit, NG placement, bowel rest, IV fluids, chronic pain control, and antibiotics.  Conservative management  for now.  Not a good candidate for surgery.    Kameren Pargas 01/08/2016, 7:45 AM

## 2016-01-08 NOTE — ED Notes (Signed)
This RN has called patient's daughter twice with no answer either time.  No name to identify who's voicemail box, so RN didn't leave a message.

## 2016-01-08 NOTE — ED Notes (Signed)
Pts daughter updated on plan of care, per pts consent.

## 2016-01-08 NOTE — ED Notes (Signed)
Patient's daughter requesting to know what is going on. States that she has been trying to get answers for hours now.  Patient's daughter informed that this RN was tied up in another room with a critical patient. Informed patient's daughter that surgery PA was at bedside earlier and dont know if she will be going to surgery today or not.  Daughter states that patient is 82 yo and need to hold off on any decision for surgery.  Informed her that I know staff has paged the hospitalist for her and she states, "yeah and for 3 hours now I have heard nothing from him".  RN apologized for the delay and updated her phone number so RN could contact her when information of care plan is available.

## 2016-01-08 NOTE — Progress Notes (Signed)
Pharmacy Antibiotic Note  Shelly Martin is a 82 y.o. female admitted on 01/08/2016 with intra-abdominal infection.  CT scan reveals SBO.  WBC elevated so surgery also concerned possible associated enteritis.  Pharmacy has been consulted for meropenem dosing due to concern for penicillin allergy; however, patient appears to have tolerated multiple cephalosporins per chart review.  Plan: Meropenem 500 mg IV q12h. F/u for narrowing therapy.  Another antibiotic option could be Levaquin or Cipro + Flagyl.  Or since patient has tolerated cephalosporins in the past, could transition to Ceftriaxone + Flagyl.    Temp (24hrs), Avg:98.3 F (36.8 C), Min:98.3 F (36.8 C), Max:98.3 F (36.8 C)   Recent Labs Lab 01/08/16 0412 01/08/16 0502  WBC 26.4*  --   CREATININE 1.13*  --   LATICACIDVEN  --  2.79*    Estimated Creatinine Clearance: 20.4 mL/min (by C-G formula based on SCr of 1.13 mg/dL).    Allergies  Allergen Reactions  . Amoxicillin Other (See Comments)    Reaction unknown  . Baclofen Other (See Comments)    "fuzzy in the eyes" and dizzy  . Hctz [Hydrochlorothiazide]     nausea  . Valium [Diazepam] Other (See Comments)    Pt became unresponsive and O2 sats dropped.    Antimicrobials this admission: 8/15 meropenem >>   Dose adjustments this admission: -  Microbiology results: 8/15 UCx: sent   Thank you for allowing pharmacy to be a part of this patient's care.  Hershal Coria 01/08/2016 8:04 AM

## 2016-01-08 NOTE — ED Provider Notes (Signed)
[facility] DEPT Provider Note   CSN: [id number] Arrival date & time: 01/08/16  0347     History   Chief Complaint Chief Complaint  Patient presents with  . Emesis    HPI Elease Legan is a 82 y.o. female no sig PMH, here with RUQ abdominal pain and vomiting multiple times.  She denies any fevers or recent infections.  Her only abdominal surgery she states is a hysterectomy.  She has no urinary complaints or diarrhea.  There are no further complaints.  10 Systems reviewed and are negative for acute change except as noted in the HPI.   HPI  Past Medical History:  Diagnosis Date  . Acute encephalopathy   . AKI (acute kidney injury) ([place])   . Altered mental state   . Anemia   . Aortic stenosis   . Arthritis    Back, knees  . Bowel incontinence   . Colon polyps   . Constipation   . Gastric ulcer    years ago  . Hypertension   . Hypokalemia   . Restless legs syndrome    on Requip  . Situational stress   . Spinal stenosis   . Stroke ([place])    Mini, no residual  . TIA (transient ischemic attack)   . Ulcer causing bleeding and hole in wall of stomach or small intestine   . Urinary bladder calculus   . Urinary incontinence   . Vertigo     Patient Active Problem List   Diagnosis Date Noted  . Narcotic addiction ([place]) 05/15/2015  . Diarrhea 03/30/2015  . Acute pharyngitis 02/26/2015  . Osteoarthritis of right knee 02/22/2015  . Pain in joint, lower leg 10/31/2014  . Ulcer of sacral region, stage 4 ([place]) 04/15/2014  . Moderate aortic stenosis 04/14/2014  . Pre-syncope 04/14/2014  . Hypotension 04/14/2014  . Acute on chronic renal failure ([place]) 04/14/2014  . Anemia 04/14/2014  . Protein-calorie malnutrition, severe ([place]) 04/14/2014  . Altered mental status   . Dehydration 04/13/2014  . Altered mental state 04/13/2014  . Acute kidney injury ([place])   . Hypokalemia 03/25/2014  . Syncope 03/16/2014  . Acute encephalopathy 03/16/2014  . Leg swelling  03/02/2014  . Avascular necrosis of bone of right hip ([place]) 09/27/2013  . Primary osteoarthritis of right hip 09/07/2013  . Spinal stenosis, lumbar region, with neurogenic claudication 09/07/2013  . Closed fracture of pubic ramus ([place]) 09/07/2013  . BPPV (benign paroxysmal positional vertigo) 09/07/2013  . Pharyngeal swelling 08/30/2013  . Pelvic fracture ([place]) 08/28/2013  . Fall 08/26/2013  . Dizziness 08/26/2013  . Abnormal stress test 08/26/2013  . Preop cardiovascular exam 08/01/2013  . Aortic stenosis 08/01/2013  . Edema 06/30/2013  . Loss of weight 01/04/2013  . Situational stress 01/04/2013  . Chronic right hip pain 07/05/2012  . Knee pain, acute 06/29/2012  . Vertigo 06/24/2012  . Acute exacerbation of chronic low back pain 06/24/2012  . Lumbar pain with radiation down right leg 06/09/2012  . Osteoarthritis of hip 04/29/2012  . History of TIAs 04/17/2012  . S/P total hysterectomy and bilateral salpingo-oophorectomy 04/17/2012  . Hard of hearing 04/17/2012  . History of anemia 04/17/2012  . RLS (restless legs syndrome) 04/17/2012  . Chronic back pain 04/17/2012  . Spinal stenosis, lumbar 04/17/2012  . Lower back pain 12/20/2011  . Personal history of colonic polyps 10/08/2011  . H/O: CVA (cardiovascular accident) 09/14/2011  . Hypertension 09/14/2011  . Restless legs syndrome 09/14/2011  . Constipation 08/20/2011  . Urinary  incontinence 08/20/2011    Past Surgical History:  Procedure Laterality Date  . COLON RESECTION  50 years ago  . LUMBAR LAMINECTOMY/DECOMPRESSION MICRODISCECTOMY  06/25/2011   Procedure: LUMBAR LAMINECTOMY/DECOMPRESSION MICRODISCECTOMY;  Surgeon: Hosie Spangle, MD;  Location: [facility];  Service: Neurosurgery;  Laterality: Right;  RIGHT Lumbar Two-Three hemilaminectomy and microdiskectomy  . TONSILLECTOMY    . UTERINE FIBROID SURGERY    . VAGINAL HYSTERECTOMY      OB History    No data available       Home Medications    Prior  to Admission medications   Medication Sig Start Date End Date Taking? Authorizing Provider  Amino Acids-Protein Hydrolys (FEEDING SUPPLEMENT, PRO-STAT SUGAR FREE 64,) LIQD Take 30 mLs by mouth 2 (two) times daily.   Yes Historical Provider, MD  amLODipine (NORVASC) 5 MG tablet Take 5 mg by mouth daily.   Yes Historical Provider, MD  feeding supplement (BOOST / RESOURCE BREEZE) LIQD Take 1 Container by mouth 2 (two) times daily between meals.   Yes Historical Provider, MD  furosemide (LASIX) 20 MG tablet Take 40 mg by mouth daily.    Yes Historical Provider, MD  linaclotide (LINZESS) 145 MCG CAPS capsule Take 145 mcg by mouth daily before breakfast.   Yes Historical Provider, MD  magnesium hydroxide (MILK OF MAGNESIA) 400 MG/5ML suspension Take 30 mLs by mouth daily as needed for mild constipation. Reported on 07/27/2015   Yes Historical Provider, MD  mirtazapine (REMERON) 15 MG tablet Take 7.5 mg by mouth at bedtime.    Yes Historical Provider, MD  morphine (MS CONTIN) 15 MG 12 hr tablet Take 15 mg by mouth 2 (two) times daily.   Yes Historical Provider, MD  Multiple Vitamin (MULTIVITAMIN) tablet Take 1 tablet by mouth daily.   Yes Historical Provider, MD  ondansetron (ZOFRAN) 4 MG tablet Take 4 mg by mouth every 12 (twelve) hours as needed for nausea or vomiting.   Yes Historical Provider, MD  oxyCODONE-acetaminophen (PERCOCET/ROXICET) 5-325 MG tablet Take 1 tablet by mouth 3 (three) times daily as needed. Breakthrough pain   Yes Historical Provider, MD  phenol (CHLORASEPTIC) 1.4 % LIQD Use as directed 1 spray in the mouth or throat 3 (three) times daily as needed for throat irritation / pain. If sore throat persist notify the MD   Yes Historical Provider, MD  potassium chloride (K-DUR) 10 MEQ tablet Take 20 mEq by mouth daily.    Yes Historical Provider, MD  rOPINIRole (REQUIP) 2 MG tablet Take 4 mg by mouth at bedtime.   Yes Historical Provider, MD  sennosides-docusate sodium (SENOKOT-S) 8.6-50 MG  tablet Take 1 tablet by mouth 2 (two) times daily. Hold for diarrhea   Yes Historical Provider, MD    Family History Family History  Problem Relation Age of Onset  . Dementia Mother   . Stroke Father   . Pancreatic cancer Brother   . Anesthesia problems Neg Hx   . Colon cancer Neg Hx   . Esophageal cancer Neg Hx   . Stomach cancer Neg Hx   . Diabetes Neg Hx   . Kidney disease Neg Hx   . Liver disease Neg Hx     Social History Social History  Substance Use Topics  . Smoking status: Never Smoker  . Smokeless tobacco: Never Used  . Alcohol use 0.0 oz/week     Comment: rarely     Allergies   Amoxicillin; Baclofen; Hctz [hydrochlorothiazide]; and Valium [diazepam]   Review of Systems  Review of Systems   Physical Exam Updated Vital Signs BP 173/68   Pulse 95   Temp 98.3 F (36.8 C) (Oral)   Resp 18   SpO2 94%   Physical Exam  Constitutional: She is oriented to person, place, and time. She appears well-developed and well-nourished. No distress.  HENT:  Head: Normocephalic and atraumatic.  Nose: Nose normal.  Mouth/Throat: Oropharynx is clear and moist. No oropharyngeal exudate.  Eyes: Conjunctivae and EOM are normal. Pupils are equal, round, and reactive to light. No scleral icterus.  Neck: Normal range of motion. Neck supple. No JVD present. No tracheal deviation present. No thyromegaly present.  Cardiovascular: Normal rate, regular rhythm and normal heart sounds.  Exam reveals no gallop and no friction rub.   No murmur heard. Pulmonary/Chest: Effort normal and breath sounds normal. No respiratory distress. She has no wheezes. She exhibits no tenderness.  Abdominal: Soft. Bowel sounds are normal. She exhibits distension. She exhibits no mass. There is tenderness. There is no rebound and no guarding.  Musculoskeletal: Normal range of motion. She exhibits no edema or tenderness.  Lymphadenopathy:    She has no cervical adenopathy.  Neurological: She is alert and  oriented to person, place, and time. No cranial nerve deficit. She exhibits normal muscle tone.  Skin: Skin is warm and dry. No rash noted. No erythema. No pallor.  Nursing note and vitals reviewed.    ED Treatments / Results  Labs (all labs ordered are listed, but only abnormal results are displayed) Labs Reviewed  CBC WITH DIFFERENTIAL/PLATELET - Abnormal; Notable for the following:       Result Value   WBC 26.4 (*)    Neutro Abs 24.0 (*)    Monocytes Absolute 1.1 (*)    All other components within normal limits  COMPREHENSIVE METABOLIC PANEL - Abnormal; Notable for the following:    Glucose, Bld 170 (*)    BUN 53 (*)    Creatinine, Ser 1.13 (*)    GFR calc non Af Amer 40 (*)    GFR calc Af Amer 46 (*)    All other components within normal limits  LIPASE, BLOOD - Abnormal; Notable for the following:    Lipase 101 (*)    All other components within normal limits  URINALYSIS, ROUTINE W REFLEX MICROSCOPIC (NOT AT [facility]) - Abnormal; Notable for the following:    Protein, ur 30 (*)    Leukocytes, UA SMALL (*)    All other components within normal limits  URINE MICROSCOPIC-ADD ON - Abnormal; Notable for the following:    Squamous Epithelial / LPF 0-5 (*)    Bacteria, UA FEW (*)    All other components within normal limits  I-STAT CG4 LACTIC ACID, ED - Abnormal; Notable for the following:    Lactic Acid, Venous 2.79 (*)    All other components within normal limits  URINE CULTURE    EKG  EKG Interpretation  Date/Time:  Tuesday January 08 2016 04:49:43 EDT Ventricular Rate:  91 PR Interval:    QRS Duration: 88 QT Interval:  365 QTC Calculation: 450 R Axis:   -7 Text Interpretation:  Sinus rhythm peaked twaves new since last tracing Confirmed by Glynn Octave [phone]) on 01/08/2016 4:54:04 AM       Radiology Ct Abdomen Pelvis W Contrast  Result Date: 01/08/2016 CLINICAL DATA:  Nausea and vomiting since 01/07/2016. Elevated white cell count at 26.4. Elevated  lipase at 101. EXAM: CT ABDOMEN AND PELVIS WITH CONTRAST TECHNIQUE: Multidetector  CT imaging of the abdomen and pelvis was performed using the standard protocol following bolus administration of intravenous contrast. CONTRAST:  1mL ISOVUE-300 IOPAMIDOL (ISOVUE-300) INJECTION 61% COMPARISON:  04/02/2015 FINDINGS: Atelectasis in the lung bases. Coronary artery calcifications. Small esophageal hiatal hernia. Tiny sub cm low-attenuation lesions in the liver similar to prior study likely representing small cysts. Cholelithiasis with single stone in the gallbladder. No gallbladder wall edema or infiltration. No bile duct dilatation. Pancreas appears somewhat atrophic and there is mild pancreatic ductal dilatation. This is similar to previous study and likely relates to chronic pancreatitis. No definite acute infiltration. The spleen, adrenal glands, kidneys, inferior vena cava, and retroperitoneal lymph nodes are unremarkable. Calcification of the aorta without aneurysm. Distended fluid-filled small bowel loops are present. Terminal ileum is decompressed. Distal jejunal loops demonstrate fold thickening. There is mesenteric edema and a small amount of ascites. Changes suggest small bowel obstruction. Transition zone is not distinctly localized but appears to be in the central pelvis. Wall thickening may indicate the presence of enteritis as well. Inflammatory cause for obstruction should be considered. No free air. Postoperative changes in the anterior abdominal wall with surgical sutures present. Pelvis: Small amount of free fluid in the pelvis. Uterus is surgically absent. Bladder wall is not thickened. No pelvic mass or lymphadenopathy. Degenerative changes in the spine and hips. No destructive bone lesions. IMPRESSION: Distended fluid-filled small bowel with small bowel fold thickening distally, mesenteric edema, and small amount of ascites. Changes likely represent small bowel obstruction possibly with associated  enteritis. Transition zone appears to be in the low pelvis. Cause is not determined but consider inflammatory etiologies. Electronically Signed   By: Lucienne Capers M.D.   On: 01/08/2016 06:51    Procedures Procedures (including critical care time)  Medications Ordered in ED Medications  sodium chloride 0.9 % bolus 1,000 mL (1,000 mLs Intravenous New Bag/Given 01/08/16 0441)  ondansetron (ZOFRAN) injection 4 mg (4 mg Intravenous Given 01/08/16 0441)  fentaNYL (SUBLIMAZE) injection 50 mcg (50 mcg Intravenous Given 01/08/16 0536)  ondansetron (ZOFRAN) injection 4 mg (4 mg Intravenous Given 01/08/16 0536)  iopamidol (ISOVUE-300) 61 % injection 100 mL (75 mLs Intravenous Contrast Given 01/08/16 [id number])     Initial Impression / Assessment and Plan / ED Course  I have reviewed the triage vital signs and the nursing notes.  Pertinent labs & imaging results that were available during my care of the patient were reviewed by me and considered in my medical decision making (see chart for details).  Clinical Course    Patient presents to the ED for abdominal pain.  WBC is 24.  She was given fentanyl and zofran for her symptoms.  CT scan reveals SBO. Will page surgery for admission.  7:36 AM Gen surg recs for medicine admission, as this may be enteritis and her WBC is 24.  Hospitalist has been paged.  Final Clinical Impressions(s) / ED Diagnoses   Final diagnoses:  None    New Prescriptions New Prescriptions   No medications on file     Everlene Balls, MD 01/08/16 [phone]

## 2016-01-08 NOTE — ED Notes (Signed)
Will, Surgical PA at bedside.

## 2016-01-08 NOTE — Progress Notes (Signed)
Called patient's daughter per patient's request to update her on status and plan. The patient and daughter were very appreciative.

## 2016-01-08 NOTE — H&P (Signed)
TRH H&P   Patient Demographics:    Shelly Martin, is a 82 y.o. female  MRN: [id number]   DOB - [birth year]  Admit Date - 01/08/2016  Outpatient Primary MD for the patient is Kelton Pillar, MD    Patient coming from: [place] [facility]  Chief Complaint  Patient presents with  . Emesis      HPI:    Shelly Martin  is a 82 y.o. female, Chronic back pain, hypertension, TIA, mild memory loss, otherwise healthy for her age comes back with on and off nausea for the last few days however since yesterday she has developed some lower quadrant abdominal pain which is diffuse in the lower sections nonradiating, constant, associated with worsening nausea, no bowel movements or passing of flatus in the last 12 hours, came to the ER where CT scan suggestive of ileitis with small bowel obstruction, possible transition point. General surgery was called who requested medicine to admit. Patient denies any fever or chills, denies any weight loss, denies any history of colon cancer or polyps, no other significant subjective complaints whatsoever.    Review of systems:    In addition to the HPI above,  No Fever-chills, No Headache, No changes with Vision or hearing, No problems swallowing food or Liquids, No Chest pain, Cough or Shortness of Breath, GI symptoms as above, No Blood in stool or Urine, No dysuria, No new skin rashes or bruises, No new joints pains-aches,  No new weakness, tingling, numbness in any extremity, No recent weight gain or loss, No polyuria, polydypsia or polyphagia, No significant Mental Stressors.  A full 10 point Review of Systems was done, except as stated above, all other Review of Systems were  negative.   With Past History of the following :    Past Medical History:  Diagnosis Date  . Acute encephalopathy   . AKI (acute kidney injury) ([place])   . Altered mental state   . Anemia   . Aortic stenosis   . Arthritis    Back, knees  . Bowel incontinence   . Colon polyps   . Constipation   . Gastric ulcer    years ago  . Hypertension   . Hypokalemia   . Restless legs syndrome    on Requip  . Situational stress   . Spinal stenosis   .  Stroke ([place])    Mini, no residual  . TIA (transient ischemic attack)   . Ulcer causing bleeding and hole in wall of stomach or small intestine   . Urinary bladder calculus   . Urinary incontinence   . Vertigo       Past Surgical History:  Procedure Laterality Date  . COLON RESECTION  50 years ago  . LUMBAR LAMINECTOMY/DECOMPRESSION MICRODISCECTOMY  06/25/2011   Procedure: LUMBAR LAMINECTOMY/DECOMPRESSION MICRODISCECTOMY;  Surgeon: Hosie Spangle, MD;  Location: [facility];  Service: Neurosurgery;  Laterality: Right;  RIGHT Lumbar Two-Three hemilaminectomy and microdiskectomy  . TONSILLECTOMY    . UTERINE FIBROID SURGERY    . VAGINAL HYSTERECTOMY        Social History:     Social History  Substance Use Topics  . Smoking status: Never Smoker  . Smokeless tobacco: Never Used  . Alcohol use 0.0 oz/week     Comment: rarely         Family History :     Family History  Problem Relation Age of Onset  . Dementia Mother   . Stroke Father   . Pancreatic cancer Brother   . Anesthesia problems Neg Hx   . Colon cancer Neg Hx   . Esophageal cancer Neg Hx   . Stomach cancer Neg Hx   . Diabetes Neg Hx   . Kidney disease Neg Hx   . Liver disease Neg Hx        Home Medications:   Prior to Admission medications   Medication Sig Start Date End Date Taking? Authorizing Provider  Amino Acids-Protein Hydrolys (FEEDING SUPPLEMENT, PRO-STAT SUGAR FREE 64,) LIQD Take 30 mLs by mouth 2 (two) times daily.   Yes Historical  Provider, MD  amLODipine (NORVASC) 5 MG tablet Take 5 mg by mouth daily.   Yes Historical Provider, MD  feeding supplement (BOOST / RESOURCE BREEZE) LIQD Take 1 Container by mouth 2 (two) times daily between meals.   Yes Historical Provider, MD  furosemide (LASIX) 20 MG tablet Take 40 mg by mouth daily.    Yes Historical Provider, MD  linaclotide (LINZESS) 145 MCG CAPS capsule Take 145 mcg by mouth daily before breakfast.   Yes Historical Provider, MD  magnesium hydroxide (MILK OF MAGNESIA) 400 MG/5ML suspension Take 30 mLs by mouth daily as needed for mild constipation. Reported on 07/27/2015   Yes Historical Provider, MD  mirtazapine (REMERON) 15 MG tablet Take 7.5 mg by mouth at bedtime.    Yes Historical Provider, MD  morphine (MS CONTIN) 15 MG 12 hr tablet Take 15 mg by mouth 2 (two) times daily.   Yes Historical Provider, MD  Multiple Vitamin (MULTIVITAMIN) tablet Take 1 tablet by mouth daily.   Yes Historical Provider, MD  ondansetron (ZOFRAN) 4 MG tablet Take 4 mg by mouth every 12 (twelve) hours as needed for nausea or vomiting.   Yes Historical Provider, MD  oxyCODONE-acetaminophen (PERCOCET/ROXICET) 5-325 MG tablet Take 1 tablet by mouth 3 (three) times daily as needed. Breakthrough pain   Yes Historical Provider, MD  phenol (CHLORASEPTIC) 1.4 % LIQD Use as directed 1 spray in the mouth or throat 3 (three) times daily as needed for throat irritation / pain. If sore throat persist notify the MD   Yes Historical Provider, MD  potassium chloride (K-DUR) 10 MEQ tablet Take 20 mEq by mouth daily.    Yes Historical Provider, MD  rOPINIRole (REQUIP) 2 MG tablet Take 4 mg by mouth at bedtime.  Yes Historical Provider, MD  sennosides-docusate sodium (SENOKOT-S) 8.6-50 MG tablet Take 1 tablet by mouth 2 (two) times daily. Hold for diarrhea   Yes Historical Provider, MD     Allergies:     Allergies  Allergen Reactions  . Amoxicillin Other (See Comments)    Reaction unknown  . Baclofen Other  (See Comments)    "fuzzy in the eyes" and dizzy  . Hctz [Hydrochlorothiazide]     nausea  . Valium [Diazepam] Other (See Comments)    Pt became unresponsive and O2 sats dropped.     Physical Exam:   Vitals  Blood pressure 173/68, pulse 95, temperature 98.3 F (36.8 C), temperature source Oral, resp. rate 18, SpO2 94 %.   1. General Pleasant white elderly female lying in bed in NAD,  looks much younger than her age,  2. Normal affect and insight, Not Suicidal or Homicidal, Awake Alert, Oriented X 3.  3. No F.N deficits, ALL C.Nerves Intact, Strength 5/5 all 4 extremities, Sensation intact all 4 extremities, Plantars down going.  4. Ears and Eyes appear Normal, Conjunctivae clear, PERRLA. Moist Oral Mucosa.  5. Supple Neck, No JVD, No cervical lymphadenopathy appriciated, No Carotid Bruits.  6. Symmetrical Chest wall movement, Good air movement bilaterally, CTAB.  7. RRR, No Gallops, Rubs, soft aortic systolic murmur, No Parasternal Heave.  8. Positive Bowel Sounds, Abdomen Soft, mild lower quadrant tenderness, No organomegaly appriciated,No rebound -guarding or rigidity.  9.  No Cyanosis, Normal Skin Turgor, No Skin Rash or Bruise.  10. Good muscle tone,  joints appear normal , no effusions, Normal ROM.  11. No Palpable Lymph Nodes in Neck or Axillae      Data Review:    CBC  Recent Labs Lab 01/08/16 0412  WBC 26.4*  HGB 12.2  HCT 39.2  PLT 336  MCV 86.5  MCH 26.9  MCHC 31.1  RDW 15.1  LYMPHSABS 1.3  MONOABS 1.1*  EOSABS 0.0  BASOSABS 0.0   ------------------------------------------------------------------------------------------------------------------  Chemistries   Recent Labs Lab 01/08/16 0412  NA 138  K 4.6  CL 102  CO2 25  GLUCOSE 170*  BUN 53*  CREATININE 1.13*  CALCIUM 9.0  AST 21  ALT 15  ALKPHOS 109  BILITOT 0.5    ------------------------------------------------------------------------------------------------------------------ estimated creatinine clearance is 20.4 mL/min (by C-G formula based on SCr of 1.13 mg/dL). ------------------------------------------------------------------------------------------------------------------ No results for input(s): TSH, T4TOTAL, T3FREE, THYROIDAB in the last 72 hours.  Invalid input(s): FREET3  Coagulation profile No results for input(s): INR, PROTIME in the last 168 hours. ------------------------------------------------------------------------------------------------------------------- No results for input(s): DDIMER in the last 72 hours. -------------------------------------------------------------------------------------------------------------------  Cardiac Enzymes No results for input(s): CKMB, TROPONINI, MYOGLOBIN in the last 168 hours.  Invalid input(s): CK ------------------------------------------------------------------------------------------------------------------ No results found for: BNP   ---------------------------------------------------------------------------------------------------------------  Urinalysis    Component Value Date/Time   COLORURINE YELLOW 01/08/2016 [place] 01/08/2016 0438   LABSPEC 1.023 01/08/2016 0438   PHURINE 5.0 01/08/2016 0438   GLUCOSEU NEGATIVE 01/08/2016 0438   HGBUR NEGATIVE 01/08/2016 0438   BILIRUBINUR NEGATIVE 01/08/2016 0438   BILIRUBINUR neg 04/15/2012 1702   KETONESUR NEGATIVE 01/08/2016 0438   PROTEINUR 30 (A) 01/08/2016 0438   UROBILINOGEN 0.2 05/25/2014 0327   NITRITE NEGATIVE 01/08/2016 0438   LEUKOCYTESUR SMALL (A) 01/08/2016 0438    ----------------------------------------------------------------------------------------------------------------   Imaging Results:    Ct Abdomen Pelvis W Contrast  Result Date: 01/08/2016 CLINICAL DATA:  Nausea and vomiting since  01/07/2016. Elevated white cell count at 26.4.  Elevated lipase at 101. EXAM: CT ABDOMEN AND PELVIS WITH CONTRAST TECHNIQUE: Multidetector CT imaging of the abdomen and pelvis was performed using the standard protocol following bolus administration of intravenous contrast. CONTRAST:  36mL ISOVUE-300 IOPAMIDOL (ISOVUE-300) INJECTION 61% COMPARISON:  04/02/2015 FINDINGS: Atelectasis in the lung bases. Coronary artery calcifications. Small esophageal hiatal hernia. Tiny sub cm low-attenuation lesions in the liver similar to prior study likely representing small cysts. Cholelithiasis with single stone in the gallbladder. No gallbladder wall edema or infiltration. No bile duct dilatation. Pancreas appears somewhat atrophic and there is mild pancreatic ductal dilatation. This is similar to previous study and likely relates to chronic pancreatitis. No definite acute infiltration. The spleen, adrenal glands, kidneys, inferior vena cava, and retroperitoneal lymph nodes are unremarkable. Calcification of the aorta without aneurysm. Distended fluid-filled small bowel loops are present. Terminal ileum is decompressed. Distal jejunal loops demonstrate fold thickening. There is mesenteric edema and a small amount of ascites. Changes suggest small bowel obstruction. Transition zone is not distinctly localized but appears to be in the central pelvis. Wall thickening may indicate the presence of enteritis as well. Inflammatory cause for obstruction should be considered. No free air. Postoperative changes in the anterior abdominal wall with surgical sutures present. Pelvis: Small amount of free fluid in the pelvis. Uterus is surgically absent. Bladder wall is not thickened. No pelvic mass or lymphadenopathy. Degenerative changes in the spine and hips. No destructive bone lesions. IMPRESSION: Distended fluid-filled small bowel with small bowel fold thickening distally, mesenteric edema, and small amount of ascites. Changes likely  represent small bowel obstruction possibly with associated enteritis. Transition zone appears to be in the low pelvis. Cause is not determined but consider inflammatory etiologies. Electronically Signed   By: Lucienne Capers M.D.   On: 01/08/2016 06:51    My personal review of EKG: Rhythm NSR, Rate  91 /min,  no Acute ST changes   Assessment & Plan:      1. SBO with possible transition zone with associated enteritis. Will be admitted, bowel rest, IV fluids, IV meropenem as she has penicillin allergy, general surgery consulted. Supportive care for nausea and pain.  2. Hypertension. As needed IV hydralazine.  3. History of TIA, chronic back pain and narcotic addiction. Supportive care.  4. Moderate AS. No acute issues.   DVT Prophylaxis Heparin    AM Labs Ordered, also please review Full Orders  Family Communication: Admission, patients condition and plan of care including tests being ordered have been discussed with the patient and who indicates understanding and agree with the plan and Code Status.  Code Status DNR  Likely DC to  [facility] 3-4 days  Condition GUARDED    Consults called: [facility]CS    Admission status: Inpt    Time spent in minutes : 35   SINGH,PRASHANT K M.D on 01/08/2016 at 8:06 AM  Between 7am to 7pm - Pager - [phone]. After 7pm go to [email or web address] - password [facility]  [facility] - Office  [phone]

## 2016-01-09 ENCOUNTER — Inpatient Hospital Stay (HOSPITAL_COMMUNITY): Payer: Medicare Other

## 2016-01-09 DIAGNOSIS — K5669 Other intestinal obstruction: Secondary | ICD-10-CM

## 2016-01-09 DIAGNOSIS — L8990 Pressure ulcer of unspecified site, unspecified stage: Secondary | ICD-10-CM | POA: Insufficient documentation

## 2016-01-09 LAB — CBC
HCT: 39 % (ref 36.0–46.0)
HEMOGLOBIN: 12.5 g/dL (ref 12.0–15.0)
MCH: 27.8 pg (ref 26.0–34.0)
MCHC: 32.1 g/dL (ref 30.0–36.0)
MCV: 86.7 fL (ref 78.0–100.0)
PLATELETS: 321 10*3/uL (ref 150–400)
RBC: 4.5 MIL/uL (ref 3.87–5.11)
RDW: 15.3 % (ref 11.5–15.5)
WBC: 17.1 10*3/uL — ABNORMAL HIGH (ref 4.0–10.5)

## 2016-01-09 LAB — BASIC METABOLIC PANEL
ANION GAP: 7 (ref 5–15)
BUN: 29 mg/dL — AB (ref 4–21)
BUN: 29 mg/dL — ABNORMAL HIGH (ref 6–20)
CALCIUM: 8.4 mg/dL — AB (ref 8.9–10.3)
CO2: 28 mmol/L (ref 22–32)
CREATININE: 0.8 mg/dL (ref 0.5–1.1)
CREATININE: 0.75 mg/dL (ref 0.44–1.00)
Chloride: 104 mmol/L (ref 101–111)
GLUCOSE: 142 mg/dL
GLUCOSE: 142 mg/dL — AB (ref 65–99)
Potassium: 3.9 mmol/L (ref 3.4–5.3)
Potassium: 3.9 mmol/L (ref 3.5–5.1)
Sodium: 139 mmol/L (ref 135–145)
Sodium: 139 mmol/L (ref 137–147)

## 2016-01-09 LAB — URINE CULTURE

## 2016-01-09 LAB — CBC AND DIFFERENTIAL
HCT: 39 % (ref 36–46)
HEMOGLOBIN: 12.5 g/dL (ref 12.0–16.0)
PLATELETS: 321 10*3/uL (ref 150–399)
WBC: 17.1 10*3/mL

## 2016-01-09 MED ORDER — LORAZEPAM 2 MG/ML IJ SOLN
0.2500 mg | Freq: Once | INTRAMUSCULAR | Status: AC
  Administered 2016-01-09: 0.25 mg via INTRAVENOUS
  Filled 2016-01-09: qty 1

## 2016-01-09 MED ORDER — LORAZEPAM 2 MG/ML IJ SOLN: 0.5000 mg | Freq: Once | INTRAMUSCULAR | Status: DC

## 2016-01-09 NOTE — Progress Notes (Signed)
As I was assessing pt's NGT I observed bright red blood mixed with stool. Notified provider on call.

## 2016-01-09 NOTE — Progress Notes (Signed)
NGT was advance after reviewing recommendation from abd XR. Kizzie Ide, RN

## 2016-01-09 NOTE — Progress Notes (Signed)
Patient ID: Shelly Martin, female   DOB: 08/01/1918, 82 y.o.   MRN: [id number] [facility] Progress Note:   * No surgery found *  Subjective: Mental status is clear for her age.  Not feeling quite as well as yesterday but continuing to have BM and gas Objective: Vital signs in last 24 hours: Temp:  [98.1 F (36.7 C)-99.3 F (37.4 C)] 98.1 F (36.7 C) (08/16 0523) Pulse Rate:  [99-102] 99 (08/16 0523) Resp:  [15-19] 19 (08/16 0523) BP: (160-185)/(66-99) 160/99 (08/16 1141) SpO2:  [97 %-100 %] 100 % (08/16 0523) Weight:  [42.5 kg (93 lb 11.1 oz)] 42.5 kg (93 lb 11.1 oz) (08/15 1900)  Intake/Output from previous day: 08/15 0701 - 08/16 0700 In: 945 [I.V.:895; IV Piggyback:50] Out: 500 [Emesis/NG output:500] Intake/Output this shift: No intake/output data recorded.  Physical Exam: Work of breathing is not labored.  Abdomen is nontender  Lab Results:  Results for orders placed or performed during the hospital encounter of 01/08/16 (from the past 48 hour(s))  CBC with Differential/Platelet     Status: Abnormal   Collection Time: 01/08/16  4:12 AM  Result Value Ref Range   WBC 26.4 (H) 4.0 - 10.5 K/uL   RBC 4.53 3.87 - 5.11 MIL/uL   Hemoglobin 12.2 12.0 - 15.0 g/dL   HCT 39.2 36.0 - 46.0 %   MCV 86.5 78.0 - 100.0 fL   MCH 26.9 26.0 - 34.0 pg   MCHC 31.1 30.0 - 36.0 g/dL   RDW 15.1 11.5 - 15.5 %   Platelets 336 150 - 400 K/uL   Neutrophils Relative % 91 %   Lymphocytes Relative 5 %   Monocytes Relative 4 %   Eosinophils Relative 0 %   Basophils Relative 0 %   Neutro Abs 24.0 (H) 1.7 - 7.7 K/uL   Lymphs Abs 1.3 0.7 - 4.0 K/uL   Monocytes Absolute 1.1 (H) 0.1 - 1.0 K/uL   Eosinophils Absolute 0.0 0.0 - 0.7 K/uL   Basophils Absolute 0.0 0.0 - 0.1 K/uL   Smear Review MORPHOLOGY UNREMARKABLE   Comprehensive metabolic panel     Status: Abnormal   Collection Time: 01/08/16  4:12 AM  Result Value Ref Range   Sodium 138 135 - 145 mmol/L   Potassium 4.6 3.5 - 5.1  mmol/L   Chloride 102 101 - 111 mmol/L   CO2 25 22 - 32 mmol/L   Glucose, Bld 170 (H) 65 - 99 mg/dL   BUN 53 (H) 6 - 20 mg/dL   Creatinine, Ser 1.13 (H) 0.44 - 1.00 mg/dL   Calcium 9.0 8.9 - 10.3 mg/dL   Total Protein 7.6 6.5 - 8.1 g/dL   Albumin 4.4 3.5 - 5.0 g/dL   AST 21 15 - 41 U/L   ALT 15 14 - 54 U/L   Alkaline Phosphatase 109 38 - 126 U/L   Total Bilirubin 0.5 0.3 - 1.2 mg/dL   GFR calc non Af Amer 40 (L) >60 mL/min   GFR calc Af Amer 46 (L) >60 mL/min    Comment: (NOTE) The eGFR has been calculated using the CKD EPI equation. This calculation has not been validated in all clinical situations. eGFR's persistently <60 mL/min signify possible Chronic Kidney Disease.    Anion gap 11 5 - 15  Lipase, blood     Status: Abnormal   Collection Time: 01/08/16  4:12 AM  Result Value Ref Range   Lipase 101 (H) 11 - 51 U/L  Urinalysis, Routine w  reflex microscopic (not at [facility])     Status: Abnormal   Collection Time: 01/08/16  4:38 AM  Result Value Ref Range   Color, Urine YELLOW YELLOW   APPearance CLEAR CLEAR   Specific Gravity, Urine 1.023 1.005 - 1.030   pH 5.0 5.0 - 8.0   Glucose, UA NEGATIVE NEGATIVE mg/dL   Hgb urine dipstick NEGATIVE NEGATIVE   Bilirubin Urine NEGATIVE NEGATIVE   Ketones, ur NEGATIVE NEGATIVE mg/dL   Protein, ur 30 (A) NEGATIVE mg/dL   Nitrite NEGATIVE NEGATIVE   Leukocytes, UA SMALL (A) NEGATIVE  Urine culture     Status: Abnormal   Collection Time: 01/08/16  4:38 AM  Result Value Ref Range   Specimen Description URINE, CLEAN CATCH    Special Requests NONE    Culture MULTIPLE SPECIES PRESENT, SUGGEST RECOLLECTION (A)    Report Status 01/09/2016 FINAL   Urine microscopic-add on     Status: Abnormal   Collection Time: 01/08/16  4:38 AM  Result Value Ref Range   Squamous Epithelial / LPF 0-5 (A) NONE SEEN   WBC, UA 0-5 0 - 5 WBC/hpf   RBC / HPF 0-5 0 - 5 RBC/hpf   Bacteria, UA FEW (A) NONE SEEN  I-Stat CG4 Lactic Acid, ED     Status: Abnormal    Collection Time: 01/08/16  5:02 AM  Result Value Ref Range   Lactic Acid, Venous 2.79 (HH) 0.5 - 1.9 mmol/L   Comment NOTIFIED PHYSICIAN   Basic metabolic panel     Status: Abnormal   Collection Time: 01/09/16  6:06 AM  Result Value Ref Range   Sodium 139 135 - 145 mmol/L   Potassium 3.9 3.5 - 5.1 mmol/L   Chloride 104 101 - 111 mmol/L   CO2 28 22 - 32 mmol/L   Glucose, Bld 142 (H) 65 - 99 mg/dL   BUN 29 (H) 6 - 20 mg/dL   Creatinine, Ser 0.75 0.44 - 1.00 mg/dL   Calcium 8.4 (L) 8.9 - 10.3 mg/dL   GFR calc non Af Amer >60 >60 mL/min   GFR calc Af Amer >60 >60 mL/min    Comment: (NOTE) The eGFR has been calculated using the CKD EPI equation. This calculation has not been validated in all clinical situations. eGFR's persistently <60 mL/min signify possible Chronic Kidney Disease.    Anion gap 7 5 - 15  CBC     Status: Abnormal   Collection Time: 01/09/16  6:06 AM  Result Value Ref Range   WBC 17.1 (H) 4.0 - 10.5 K/uL   RBC 4.50 3.87 - 5.11 MIL/uL   Hemoglobin 12.5 12.0 - 15.0 g/dL   HCT 39.0 36.0 - 46.0 %   MCV 86.7 78.0 - 100.0 fL   MCH 27.8 26.0 - 34.0 pg   MCHC 32.1 30.0 - 36.0 g/dL   RDW 15.3 11.5 - 15.5 %   Platelets 321 150 - 400 K/uL    Radiology/Results: Ct Abdomen Pelvis W Contrast  Result Date: 01/08/2016 CLINICAL DATA:  Nausea and vomiting since 01/07/2016. Elevated white cell count at 26.4. Elevated lipase at 101. EXAM: CT ABDOMEN AND PELVIS WITH CONTRAST TECHNIQUE: Multidetector CT imaging of the abdomen and pelvis was performed using the standard protocol following bolus administration of intravenous contrast. CONTRAST:  41m ISOVUE-300 IOPAMIDOL (ISOVUE-300) INJECTION 61% COMPARISON:  04/02/2015 FINDINGS: Atelectasis in the lung bases. Coronary artery calcifications. Small esophageal hiatal hernia. Tiny sub cm low-attenuation lesions in the liver similar to prior study likely representing small  cysts. Cholelithiasis with single stone in the gallbladder. No  gallbladder wall edema or infiltration. No bile duct dilatation. Pancreas appears somewhat atrophic and there is mild pancreatic ductal dilatation. This is similar to previous study and likely relates to chronic pancreatitis. No definite acute infiltration. The spleen, adrenal glands, kidneys, inferior vena cava, and retroperitoneal lymph nodes are unremarkable. Calcification of the aorta without aneurysm. Distended fluid-filled small bowel loops are present. Terminal ileum is decompressed. Distal jejunal loops demonstrate fold thickening. There is mesenteric edema and a small amount of ascites. Changes suggest small bowel obstruction. Transition zone is not distinctly localized but appears to be in the central pelvis. Wall thickening may indicate the presence of enteritis as well. Inflammatory cause for obstruction should be considered. No free air. Postoperative changes in the anterior abdominal wall with surgical sutures present. Pelvis: Small amount of free fluid in the pelvis. Uterus is surgically absent. Bladder wall is not thickened. No pelvic mass or lymphadenopathy. Degenerative changes in the spine and hips. No destructive bone lesions. IMPRESSION: Distended fluid-filled small bowel with small bowel fold thickening distally, mesenteric edema, and small amount of ascites. Changes likely represent small bowel obstruction possibly with associated enteritis. Transition zone appears to be in the low pelvis. Cause is not determined but consider inflammatory etiologies. Electronically Signed   By: Lucienne Capers M.D.   On: 01/08/2016 06:51   Dg Abd 2 Views  Result Date: 01/09/2016 CLINICAL DATA:  Small-bowel obstruction follow-up. EXAM: ABDOMEN - 2 VIEW COMPARISON:  01/09/2016. FINDINGS: NG tube tip noted in the upper stomach. Side hole left gastroesophageal junction. Advancement of the NG tube approximately 6 -8 cm should be considered. Soft tissue structures are unremarkable. Persistent small bowel  distention with interim improvement from prior exam. Colonic gas pattern normal. Stool noted in the colon and rectum. No free air. Degenerative changes lumbar spine and both hips. Surgical sutures are noted over the abdomen. IMPRESSION: 1.  Persistent but improving small bowel distention. 2. NG tube tip noted in the upper portion stomach. Side hole noted at the gastroesophageal junction. Advancement of the NG tube tip approximately 6 -8 cm should be considered. Electronically Signed   By: Marcello Moores  Register   On: 01/09/2016 08:38   Dg Abd Portable 1v  Result Date: 01/09/2016 CLINICAL DATA:  PE 82 y/o  F; small bowel obstruction. EXAM: PORTABLE ABDOMEN - 1 VIEW COMPARISON:  CT of abdomen and pelvis dated 01/08/2016. FINDINGS: No pneumoperitoneum. Persistent dilated loops of small bowel and the mid and lower abdomen. Surgical sutures in material from anterior mesh repair. Bones are diffusely demineralized. Degenerative changes of the right greater than left hip joints and lower lumbar spine. IMPRESSION: Persistent dilated loops of small bowel in the mid and lower abdomen. No pneumoperitoneum. Electronically Signed   By: Kristine Garbe M.D.   On: 01/09/2016 06:28    Anti-infectives: Anti-infectives    Start     Dose/Rate Route Frequency Ordered Stop   01/08/16 2200  meropenem (MERREM) 500 mg in sodium chloride 0.9 % 50 mL IVPB     500 mg 100 mL/hr over 30 Minutes Intravenous Every 12 hours 01/08/16 0945     01/08/16 0815  meropenem (MERREM) 500 mg in sodium chloride 0.9 % 50 mL IVPB     500 mg 100 mL/hr over 30 Minutes Intravenous  Once 01/08/16 4742 01/08/16 1105      Assessment/Plan: Problem List: Patient Active Problem List   Diagnosis Date Noted  . Pressure ulcer 01/09/2016  .  SBO (small bowel obstruction) ([place]) 01/08/2016  . Narcotic addiction ([place]) 05/15/2015  . Diarrhea 03/30/2015  . Acute pharyngitis 02/26/2015  . Osteoarthritis of right knee 02/22/2015  . Pain in joint,  lower leg 10/31/2014  . Ulcer of sacral region, stage 4 ([place]) 04/15/2014  . Moderate aortic stenosis 04/14/2014  . Pre-syncope 04/14/2014  . Hypotension 04/14/2014  . Acute on chronic renal failure ([place]) 04/14/2014  . Anemia 04/14/2014  . Protein-calorie malnutrition, severe ([place]) 04/14/2014  . Altered mental status   . Dehydration 04/13/2014  . Altered mental state 04/13/2014  . Acute kidney injury ([place])   . Hypokalemia 03/25/2014  . Syncope 03/16/2014  . Acute encephalopathy 03/16/2014  . Leg swelling 03/02/2014  . Avascular necrosis of bone of right hip ([place]) 09/27/2013  . Primary osteoarthritis of right hip 09/07/2013  . Spinal stenosis, lumbar region, with neurogenic claudication 09/07/2013  . Closed fracture of pubic ramus ([place]) 09/07/2013  . BPPV (benign paroxysmal positional vertigo) 09/07/2013  . Pharyngeal swelling 08/30/2013  . Pelvic fracture ([place]) 08/28/2013  . Fall 08/26/2013  . Dizziness 08/26/2013  . Abnormal stress test 08/26/2013  . Preop cardiovascular exam 08/01/2013  . Aortic stenosis 08/01/2013  . Edema 06/30/2013  . Loss of weight 01/04/2013  . Situational stress 01/04/2013  . Chronic right hip pain 07/05/2012  . Knee pain, acute 06/29/2012  . Vertigo 06/24/2012  . Acute exacerbation of chronic low back pain 06/24/2012  . Lumbar pain with radiation down right leg 06/09/2012  . Osteoarthritis of hip 04/29/2012  . History of TIAs 04/17/2012  . S/P total hysterectomy and bilateral salpingo-oophorectomy 04/17/2012  . Hard of hearing 04/17/2012  . History of anemia 04/17/2012  . RLS (restless legs syndrome) 04/17/2012  . Chronic back pain 04/17/2012  . Spinal stenosis, lumbar 04/17/2012  . Lower back pain 12/20/2011  . Personal history of colonic polyps 10/08/2011  . H/O: CVA (cardiovascular accident) 09/14/2011  . Essential hypertension 09/14/2011  . Restless legs syndrome 09/14/2011  . Constipation 08/20/2011  . Urinary incontinence 08/20/2011     Xray is slightly improved.  Clinically passing flatus.  Continue observation NPO * No surgery found *    LOS: 1 day   Matt B. Hassell Done, MD, [facility] [phone] beeper [phone]  01/09/2016 1:52 PM

## 2016-01-09 NOTE — Progress Notes (Signed)
Initial Nutrition Assessment  DOCUMENTATION CODES:   Severe malnutrition in context of chronic illness  INTERVENTION:   Recommend consider parenteral nutrition support if pt to remain NPO as pt is chronically severely malnourished.    NUTRITION DIAGNOSIS:   Malnutrition (Severe) related to chronic illness as evidenced by severe depletion of body fat, severe depletion of muscle mass.  GOAL:   Patient will meet greater than or equal to 90% of their needs  MONITOR:   PO intake, Supplement acceptance, I & O's, Weight trends  REASON FOR ASSESSMENT:   Malnutrition Screening Tool    ASSESSMENT:   Pt with hx of chronic back pain, hypertension, TIA, mild memory loss, otherwise healthy for her age comes in with on and off nausea for the last few days, since yesterday she has developed some lower quadrant abdominal pain associated with worsening nausea. CT scan suggestive of ileitis with small bowel obstruction, possible transition point.    Pt sleeping during most of visit. Daughter at bedside and reports pt's weight fluctuates from 92-99 lb. She is unsure of her usual intake at meals or of supplements.   Medications reviewed Labs reviewed: glucose 142 Nutrition-Focused physical exam completed. Findings are severe fat depletion, severe muscle depletion, and no edema. Per daughter pt is wheelchair bound therefore bilateral lower extremity muscle depletion from disuse.  71 F NG: 500 ml output 8/15   Diet Order:  Diet NPO time specified  Skin:  Wound (see comment) (stage I on sacrum)  Last BM:  8/16  Height:   Ht Readings from Last 1 Encounters:  01/08/16 5' (1.524 m)    Weight:   Wt Readings from Last 1 Encounters:  01/08/16 93 lb 11.1 oz (42.5 kg)    Ideal Body Weight:  45.4 kg  BMI:  Body mass index is 18.3 kg/m.  Estimated Nutritional Needs:   Kcal:  [id number]  Protein:  60-70 grams  Fluid:  > 1.5 L/day  EDUCATION NEEDS:   No education needs identified  at this time  [place], [place], [place] Pager [phone] After Hours Pager

## 2016-01-09 NOTE — Clinical Social Work Note (Signed)
Clinical Social Work Assessment  Patient Details  Name: Shelly Martin MRN: [id number] Date of Birth: [birth year]  Date of referral:  01/09/16               Reason for consult:  Facility Placement                Permission sought to share information with:  Case Manager, Facility Sport and exercise psychologist, Family Supports Permission granted to share information::     Name::        Agency::   [facility]  Relationship::   Daughter  Contact Information:   [phone]  Housing/Transportation Living arrangements for the past 2 months:  [place] of Information:  Patient, Adult Children Patient Interpreter Needed:  None Criminal Activity/Legal Involvement Pertinent to Current Situation/Hospitalization:  No - Comment as needed Significant Relationships:  Adult Children Lives with:  Facility Resident Do you feel safe going back to the place where you live?  Yes Need for family participation in patient care:  Yes (Comment)  Care giving concerns: Patient reports, "I am tired of this, I don't think I am going to leave this hospital. Call my daughter and tell her I am tired, I am done." Patient expressed she is tired of treatment and being in the hospital. Patient does not believe she will go back to SNF but to a Hospice home. Patient reports she talked with her daughter about it and asked LCSWA to call her daughter.  LCSWA spoke with patient daughter and she expressed the patient is in one of "moods again".Patient daughter expressed her and patient did not have a conversation about end of life stuff. She reports the patient talks about end of life and then changes her mind.  Daughter reports the patient will return to the [facility] at discharge.    Social Worker assessment / plan:  LCSWA met with patient at bedside, patient agreeable to talk. Patient seemed irritable about being in the hospital. Patient explained she had discussed end of life with her daughter and she  wanted me to follow up. Patient daughter denied having the conversation and reports this is normal behavior for her mom when she is in the hospital. LCSWA provided emotional support to patient.  LCSWA will assist pt. With disposition return to SNF.   Employment status:  Retired Forensic scientist:  Medicare PT Recommendations:   SNF Information / Referral to community resources:  [place]  Patient/Family's Response to care:  Agreeable.   Patient/Family's Understanding of and Emotional Response to Diagnosis, Current Treatment, and Prognosis:  Patient is having difficulty understanding her reason for being in the hospital. Daughter reports she will continue to be supportive of her mother health. Patient wil return to SNF-[facility] to continue treatment and care.   Emotional Assessment Appearance:  Appears stated age Attitude/Demeanor/Rapport:    Affect (typically observed):  Irritable, Overwhelmed, Accepting Orientation:  Oriented to Self, Oriented to Place, Oriented to Situation, Oriented to  Time Alcohol / Substance use:  Not Applicable Psych involvement (Current and /or in the community):  No (Comment)  Discharge Needs  Concerns to be addressed:  Care Coordination, Decision making concerns Readmission within the last 30 days:    Current discharge risk:  None Barriers to Discharge:  Continued Medical Work up   [employer], LCSW 01/09/2016, 12:56 PM

## 2016-01-09 NOTE — Progress Notes (Signed)
PROGRESS NOTE    Kimberely Arnet  [id number] DOB: 08-02-1918 DOA: 01/08/2016 PCP: Kelton Pillar, MD    Brief Narrative:   Patient is a 82 year old who presented with SBO. Gen. surgery on board and assisting. There is transition zone with associated enteritis and as such patient has been placed on IV antibiotics.  Assessment & Plan:   Principal Problem:   SBO (small bowel obstruction) (HCC) - Continue supportive therapy. General surgery assisting with management.  Enteritis - Plan will be to continue IV antibiotics.  Active Problems:   History of TIAs  - No new focal neurological deficits. Stable   Hard of hearing   Aortic stenosis - Stable   Narcotic addiction (HCC)    Pressure ulcer - Continue current management  DVT prophylaxis: SCDs Code Status: DNR Family Communication: None at bedside Disposition Plan: Pending improvement in condition and specialist recommendations   Consultants:   General surgery   Procedures: None   Antimicrobials: Meropenem   Subjective: Patient has no new complaints. No acute issues overnight  Objective: Vitals:   01/08/16 2059 01/09/16 0523 01/09/16 1141 01/09/16 1508  BP: (!) 168/69 (!) 185/77 (!) 160/99 (!) 150/76  Pulse: 99 99  (!) 124  Resp: 19 19  19   Temp: 99.3 F (37.4 C) 98.1 F (36.7 C)  99 F (37.2 C)  TempSrc: Oral Oral  Oral  SpO2: 97% 100%  97%  Weight:      Height:        Intake/Output Summary (Last 24 hours) at 01/09/16 1537 Last data filed at 01/09/16 0804  Gross per 24 hour  Intake              945 ml  Output              500 ml  Net              445 ml   Filed Weights   01/08/16 1900  Weight: 42.5 kg (93 lb 11.1 oz)    Examination:  General exam: Appears calm and comfortable , In no acute distress Respiratory system: Clear to auscultation. Respiratory effort normal., Equal chest rise Cardiovascular system: S1 & S2 heard, RRR. No JVD, murmurs, rubs, gallops or clicks. No pedal  edema. Gastrointestinal system: Abdomen is nondistended, soft and nontender. No organomegaly or masses felt. Normal bowel sounds heard. Central nervous system: Alert and oriented. No focal neurological deficits. Extremities: Symmetric 5 x 5 power. Skin: No rashes, lesions or ulcers Psychiatry: Judgement and insight appear normal. Mood & affect appropriate.   Data Reviewed: I have personally reviewed following labs and imaging studies  CBC:  Recent Labs Lab 01/08/16 0412 01/09/16 0606  WBC 26.4* 17.1*  NEUTROABS 24.0*  --   HGB 12.2 12.5  HCT 39.2 39.0  MCV 86.5 86.7  PLT 336 [id number]   Basic Metabolic Panel:  Recent Labs Lab 01/08/16 0412 01/09/16 0606  NA 138 139  K 4.6 3.9  CL 102 104  CO2 25 28  GLUCOSE 170* 142*  BUN 53* 29*  CREATININE 1.13* 0.75  CALCIUM 9.0 8.4*   GFR: Estimated Creatinine Clearance: 28.2 mL/min (by C-G formula based on SCr of 0.8 mg/dL). Liver Function Tests:  Recent Labs Lab 01/08/16 0412  AST 21  ALT 15  ALKPHOS 109  BILITOT 0.5  PROT 7.6  ALBUMIN 4.4    Recent Labs Lab 01/08/16 0412  LIPASE 101*   No results for input(s): AMMONIA in the last 168 hours. Coagulation Profile: No  results for input(s): INR, PROTIME in the last 168 hours. Cardiac Enzymes: No results for input(s): CKTOTAL, CKMB, CKMBINDEX, TROPONINI in the last 168 hours. BNP (last 3 results) No results for input(s): PROBNP in the last 8760 hours. HbA1C: No results for input(s): HGBA1C in the last 72 hours. CBG: No results for input(s): GLUCAP in the last 168 hours. Lipid Profile: No results for input(s): CHOL, HDL, LDLCALC, TRIG, CHOLHDL, LDLDIRECT in the last 72 hours. Thyroid Function Tests: No results for input(s): TSH, T4TOTAL, FREET4, T3FREE, THYROIDAB in the last 72 hours. Anemia Panel: No results for input(s): VITAMINB12, FOLATE, FERRITIN, TIBC, IRON, RETICCTPCT in the last 72 hours. Sepsis Labs:  Recent Labs Lab 01/08/16 0502  LATICACIDVEN 2.79*      Recent Results (from the past 240 hour(s))  Urine culture     Status: Abnormal   Collection Time: 01/08/16  4:38 AM  Result Value Ref Range Status   Specimen Description URINE, CLEAN CATCH  Final   Special Requests NONE  Final   Culture MULTIPLE SPECIES PRESENT, SUGGEST RECOLLECTION (A)  Final   Report Status 01/09/2016 FINAL  Final         Radiology Studies: Ct Abdomen Pelvis W Contrast  Result Date: 01/08/2016 CLINICAL DATA:  Nausea and vomiting since 01/07/2016. Elevated white cell count at 26.4. Elevated lipase at 101. EXAM: CT ABDOMEN AND PELVIS WITH CONTRAST TECHNIQUE: Multidetector CT imaging of the abdomen and pelvis was performed using the standard protocol following bolus administration of intravenous contrast. CONTRAST:  67mL ISOVUE-300 IOPAMIDOL (ISOVUE-300) INJECTION 61% COMPARISON:  04/02/2015 FINDINGS: Atelectasis in the lung bases. Coronary artery calcifications. Small esophageal hiatal hernia. Tiny sub cm low-attenuation lesions in the liver similar to prior study likely representing small cysts. Cholelithiasis with single stone in the gallbladder. No gallbladder wall edema or infiltration. No bile duct dilatation. Pancreas appears somewhat atrophic and there is mild pancreatic ductal dilatation. This is similar to previous study and likely relates to chronic pancreatitis. No definite acute infiltration. The spleen, adrenal glands, kidneys, inferior vena cava, and retroperitoneal lymph nodes are unremarkable. Calcification of the aorta without aneurysm. Distended fluid-filled small bowel loops are present. Terminal ileum is decompressed. Distal jejunal loops demonstrate fold thickening. There is mesenteric edema and a small amount of ascites. Changes suggest small bowel obstruction. Transition zone is not distinctly localized but appears to be in the central pelvis. Wall thickening may indicate the presence of enteritis as well. Inflammatory cause for obstruction should be  considered. No free air. Postoperative changes in the anterior abdominal wall with surgical sutures present. Pelvis: Small amount of free fluid in the pelvis. Uterus is surgically absent. Bladder wall is not thickened. No pelvic mass or lymphadenopathy. Degenerative changes in the spine and hips. No destructive bone lesions. IMPRESSION: Distended fluid-filled small bowel with small bowel fold thickening distally, mesenteric edema, and small amount of ascites. Changes likely represent small bowel obstruction possibly with associated enteritis. Transition zone appears to be in the low pelvis. Cause is not determined but consider inflammatory etiologies. Electronically Signed   By: Lucienne Capers M.D.   On: 01/08/2016 06:51   Dg Abd 2 Views  Result Date: 01/09/2016 CLINICAL DATA:  Small-bowel obstruction follow-up. EXAM: ABDOMEN - 2 VIEW COMPARISON:  01/09/2016. FINDINGS: NG tube tip noted in the upper stomach. Side hole left gastroesophageal junction. Advancement of the NG tube approximately 6 -8 cm should be considered. Soft tissue structures are unremarkable. Persistent small bowel distention with interim improvement from prior exam. Colonic gas  pattern normal. Stool noted in the colon and rectum. No free air. Degenerative changes lumbar spine and both hips. Surgical sutures are noted over the abdomen. IMPRESSION: 1.  Persistent but improving small bowel distention. 2. NG tube tip noted in the upper portion stomach. Side hole noted at the gastroesophageal junction. Advancement of the NG tube tip approximately 6 -8 cm should be considered. Electronically Signed   By: Marcello Moores  Register   On: 01/09/2016 08:38   Dg Abd Portable 1v  Result Date: 01/09/2016 CLINICAL DATA:  PE 82 y/o  F; small bowel obstruction. EXAM: PORTABLE ABDOMEN - 1 VIEW COMPARISON:  CT of abdomen and pelvis dated 01/08/2016. FINDINGS: No pneumoperitoneum. Persistent dilated loops of small bowel and the mid and lower abdomen. Surgical  sutures in material from anterior mesh repair. Bones are diffusely demineralized. Degenerative changes of the right greater than left hip joints and lower lumbar spine. IMPRESSION: Persistent dilated loops of small bowel in the mid and lower abdomen. No pneumoperitoneum. Electronically Signed   By: Kristine Garbe M.D.   On: 01/09/2016 06:28        Scheduled Meds: . chlorhexidine  15 mL Mouth/Throat BID  . meropenem (MERREM) IV  500 mg Intravenous Q12H   Continuous Infusions: . sodium chloride 50 mL/hr at 01/09/16 1333     LOS: 1 day    Time spent: > 35 minutes  Velvet Bathe, MD [facility] Pager [phone]  If 7PM-7AM, please contact night-coverage [email or web address] Password [facility] 01/09/2016, 3:37 PM

## 2016-01-10 ENCOUNTER — Inpatient Hospital Stay (HOSPITAL_COMMUNITY): Payer: Medicare Other

## 2016-01-10 LAB — CBC
HCT: 36.9 % (ref 36.0–46.0)
Hemoglobin: 11.9 g/dL — ABNORMAL LOW (ref 12.0–15.0)
MCH: 27.5 pg (ref 26.0–34.0)
MCHC: 32.2 g/dL (ref 30.0–36.0)
MCV: 85.2 fL (ref 78.0–100.0)
PLATELETS: 311 10*3/uL (ref 150–400)
RBC: 4.33 MIL/uL (ref 3.87–5.11)
RDW: 14.9 % (ref 11.5–15.5)
WBC: 15.1 10*3/uL — AB (ref 4.0–10.5)

## 2016-01-10 LAB — BASIC METABOLIC PANEL
Anion gap: 9 (ref 5–15)
BUN: 29 mg/dL — AB (ref 4–21)
BUN: 29 mg/dL — AB (ref 6–20)
CALCIUM: 8.8 mg/dL — AB (ref 8.9–10.3)
CO2: 27 mmol/L (ref 22–32)
CREATININE: 0.71 mg/dL (ref 0.44–1.00)
Chloride: 107 mmol/L (ref 101–111)
Creatinine: 0.7 mg/dL (ref 0.5–1.1)
GFR calc Af Amer: >60 mL/min (ref >60)
GLUCOSE: 133 mg/dL
GLUCOSE: 133 mg/dL — AB (ref 65–99)
Potassium: 3.3 mmol/L — AB (ref 3.4–5.3)
Potassium: 3.3 mmol/L — ABNORMAL LOW (ref 3.5–5.1)
Sodium: 143 mmol/L (ref 135–145)
Sodium: 143 mmol/L (ref 137–147)

## 2016-01-10 LAB — CBC AND DIFFERENTIAL
HEMATOCRIT: 37 % (ref 36–46)
Hemoglobin: 11.9 g/dL — AB (ref 12.0–16.0)
PLATELETS: 311 10*3/uL (ref 150–399)
WBC: 15.1 10^3/mL

## 2016-01-10 LAB — MAGNESIUM: Magnesium: 2 mg/dL (ref 1.7–2.4)

## 2016-01-10 MED ORDER — ROPINIROLE HCL ER 8 MG PO TB24
8.0000 mg | ORAL_TABLET | Freq: Every day | ORAL | Status: DC
  Administered 2016-01-10 – 2016-01-14 (×5): 8 mg via ORAL
  Filled 2016-01-10 (×6): qty 1

## 2016-01-10 MED ORDER — DEXTROSE 5 % IV SOLN
2.0000 g | INTRAVENOUS | Status: DC
  Administered 2016-01-10 – 2016-01-14 (×5): 2 g via INTRAVENOUS
  Filled 2016-01-10 (×6): qty 2

## 2016-01-10 MED ORDER — METRONIDAZOLE IN NACL 5-0.79 MG/ML-% IV SOLN
500.0000 mg | Freq: Three times a day (TID) | INTRAVENOUS | Status: DC
  Administered 2016-01-10 – 2016-01-15 (×15): 500 mg via INTRAVENOUS
  Filled 2016-01-10 (×15): qty 100

## 2016-01-10 MED ORDER — LORAZEPAM 2 MG/ML IJ SOLN
0.5000 mg | Freq: Once | INTRAMUSCULAR | Status: AC
  Administered 2016-01-10: 0.5 mg via INTRAVENOUS
  Filled 2016-01-10: qty 1

## 2016-01-10 MED ORDER — POTASSIUM CHLORIDE CRYS ER 10 MEQ PO TBCR
10.0000 meq | EXTENDED_RELEASE_TABLET | Freq: Three times a day (TID) | ORAL | Status: AC
  Administered 2016-01-10 – 2016-01-11 (×4): 10 meq via ORAL
  Filled 2016-01-10 (×4): qty 1

## 2016-01-10 NOTE — NC FL2 (Addendum)
[place] LEVEL OF CARE SCREENING TOOL     IDENTIFICATION  Patient Name: Shelly Martin Birthdate: [birth year] Sex: female Admission Date (Current Location): 01/08/2016  [facility] and [place] Number:  [place] and Address:  [facility],  [address], [place]      Provider Number: [id number]  Attending Physician Name and Address:  Velvet Bathe, MD  Relative Name and Phone Number:   [name]   [phone]  Current Level of Care: Hospital Recommended Level of Care: [place] Prior Approval Number:    Date Approved/Denied:   PASRR Number:  [id number]  Discharge Plan: SNF    Current Diagnoses: Patient Active Problem List   Diagnosis Date Noted  . Pressure ulcer 01/09/2016  . SBO (small bowel obstruction) ([place]) 01/08/2016  . Narcotic addiction ([place]) 05/15/2015  . Diarrhea 03/30/2015  . Acute pharyngitis 02/26/2015  . Osteoarthritis of right knee 02/22/2015  . Pain in joint, lower leg 10/31/2014  . Ulcer of sacral region, stage 4 ([place]) 04/15/2014  . Moderate aortic stenosis 04/14/2014  . Pre-syncope 04/14/2014  . Hypotension 04/14/2014  . Acute on chronic renal failure ([place]) 04/14/2014  . Anemia 04/14/2014  . Protein-calorie malnutrition, severe ([place]) 04/14/2014  . Altered mental status   . Dehydration 04/13/2014  . Altered mental state 04/13/2014  . Acute kidney injury ([place])   . Hypokalemia 03/25/2014  . Syncope 03/16/2014  . Acute encephalopathy 03/16/2014  . Leg swelling 03/02/2014  . Avascular necrosis of bone of right hip ([place]) 09/27/2013  . Primary osteoarthritis of right hip 09/07/2013  . Spinal stenosis, lumbar region, with neurogenic claudication 09/07/2013  . Closed fracture of pubic ramus ([place]) 09/07/2013  . BPPV (benign paroxysmal positional vertigo) 09/07/2013  . Pharyngeal swelling 08/30/2013  . Pelvic fracture ([place]) 08/28/2013  . Fall 08/26/2013  . Dizziness 08/26/2013  .  Abnormal stress test 08/26/2013  . Preop cardiovascular exam 08/01/2013  . Aortic stenosis 08/01/2013  . Edema 06/30/2013  . Loss of weight 01/04/2013  . Situational stress 01/04/2013  . Chronic right hip pain 07/05/2012  . Knee pain, acute 06/29/2012  . Vertigo 06/24/2012  . Acute exacerbation of chronic low back pain 06/24/2012  . Lumbar pain with radiation down right leg 06/09/2012  . Osteoarthritis of hip 04/29/2012  . History of TIAs 04/17/2012  . S/P total hysterectomy and bilateral salpingo-oophorectomy 04/17/2012  . Hard of hearing 04/17/2012  . History of anemia 04/17/2012  . RLS (restless legs syndrome) 04/17/2012  . Chronic back pain 04/17/2012  . Spinal stenosis, lumbar 04/17/2012  . Lower back pain 12/20/2011  . Personal history of colonic polyps 10/08/2011  . H/O: CVA (cardiovascular accident) 09/14/2011  . Essential hypertension 09/14/2011  . Restless legs syndrome 09/14/2011  . Constipation 08/20/2011  . Urinary incontinence 08/20/2011    Orientation RESPIRATION BLADDER Height & Weight     Self, Time, Situation, Place  Normal Incontinent Weight: 93 lb 11.1 oz (42.5 kg) Height:  5' (152.4 cm)  BEHAVIORAL SYMPTOMS/MOOD NEUROLOGICAL BOWEL NUTRITION STATUS      Incontinent Diet (Full Liquid )  AMBULATORY STATUS COMMUNICATION OF NEEDS Skin   Limited Assist Verbally Other (Comment) (Pressure Ulcer)                       Personal Care Assistance Level of Assistance  Bathing, Feeding, Dressing Bathing Assistance: Limited assistance Feeding assistance: Independent Dressing Assistance: Limited assistance     Functional Limitations Info  Sight,  Hearing, Speech Sight Info: Impaired Hearing Info: Adequate Speech Info: Adequate    SPECIAL CARE FACTORS FREQUENCY  PT (By licensed PT), OT (By licensed OT)  3x                  Contractures Contractures Info: Not present    Additional Factors Info  Code Status, Allergies Code Status Info:  DNR Allergies Info: : Amoxicillin, Baclofen, Hctz Hydrochlorothiazide, Valium Diazepam           Current Medications (01/10/2016):  This is the current hospital active medication list Current Facility-Administered Medications  Medication Dose Route Frequency Provider Last Rate Last Dose  . albuterol (PROVENTIL) (2.5 MG/3ML) 0.083% nebulizer solution 2.5 mg  2.5 mg Nebulization Q4H PRN Thurnell Lose, MD      . cefTRIAXone (ROCEPHIN) 2 g in dextrose 5 % 50 mL IVPB  2 g Intravenous Q24H Velvet Bathe, MD      . chlorhexidine (PERIDEX) 0.12 % solution 15 mL  15 mL Mouth/Throat BID Thurnell Lose, MD   15 mL at 01/10/16 1001  . hydrALAZINE (APRESOLINE) injection 10 mg  10 mg Intravenous Q6H PRN Thurnell Lose, MD   10 mg at 01/10/16 [id number]  . metroNIDAZOLE (FLAGYL) IVPB 500 mg  500 mg Intravenous Q8H Velvet Bathe, MD      . morphine 2 MG/ML injection 2 mg  2 mg Intravenous Q4H PRN Thurnell Lose, MD   2 mg at 01/09/16 2119  . ondansetron (ZOFRAN) injection 4 mg  4 mg Intravenous Q6H PRN Thurnell Lose, MD   4 mg at 01/08/16 1314  . phenol (CHLORASEPTIC) mouth spray 1 spray  1 spray Mouth/Throat PRN Thurnell Lose, MD         Discharge Medications: Please see discharge summary for a list of discharge medications.  Relevant Imaging Results:  Relevant Lab Results:   Additional Information [id number]  Yi Falletta A Maanav Kassabian, LCSW

## 2016-01-10 NOTE — Progress Notes (Signed)
Subjective: She actually looks pretty good. She didn't really remember she had a bowel movement initially, but later she did remember it. No nausea minimal NG drainage. She would like to get this tube out, began walking, and have a milkshake.  Objective: Vital signs in last 24 hours: Temp:  [99 F (37.2 C)-99.7 F (37.6 C)] 99.7 F (37.6 C) (08/16 2214) Pulse Rate:  [108-124] 108 (08/16 2214) Resp:  [19] 19 (08/16 1508) BP: (150-168)/(76-98) 168/98 (08/16 2214) SpO2:  [96 %-97 %] 96 % (08/16 2214) Last BM Date: 01/10/16  NG: 400 intake recorded nothing output side Urine output 7 Stools: 10 recorded yesterday a.m. 1 recorded this a.m. Afebrile vital signs are stable MAXIMUM TEMPERATURE 99.7 BP is up. Potassium is 3.3 WBC down to 15.1 Abdominal films a.m. still some loops of small bowel distention noted no free air Intake/Output from previous day: 08/16 0701 - 08/17 0700 In: 1700 [I.V.:1200; NG/GT:400; IV Piggyback:100] Out: -  Intake/Output this shift: No intake/output data recorded.  General appearance: alert, cooperative and no distress Resp: Some expiratory wheezing, but otherwise clear GI: soft, non-tender; bowel sounds normal; no masses,  no organomegaly  Lab Results:   Recent Labs  01/09/16 0606 01/10/16 0838  WBC 17.1* 15.1*  HGB 12.5 11.9*  HCT 39.0 36.9  PLT 321 311    BMET  Recent Labs  01/09/16 0606 01/10/16 0838  NA 139 143  K 3.9 3.3*  CL 104 107  CO2 28 27  GLUCOSE 142* 133*  BUN 29* 29*  CREATININE 0.75 0.71  CALCIUM 8.4* 8.8*   PT/INR No results for input(s): LABPROT, INR in the last 72 hours.   Recent Labs Lab 01/08/16 0412  AST 21  ALT 15  ALKPHOS 109  BILITOT 0.5  PROT 7.6  ALBUMIN 4.4     Lipase     Component Value Date/Time   LIPASE 101 (H) 01/08/2016 0412     Studies/Results: Dg Abd 2 Views  Result Date: 01/10/2016 CLINICAL DATA:  Bowel obstruction. EXAM: ABDOMEN - 2 VIEW COMPARISON:  01/09/2016. FINDINGS:  Surgical sutures noted over the abdomen. NG tube noted with tip projected over the stomach. NG tube is been slightly advanced. Several air-filled loops of small bowel noted. No free air. Degenerative changes thoracolumbar spine. Terra changes both hips. IMPRESSION: 1. NG tube noted with its tip projected over the upper stomach. G tube is been slightly advanced. 2. Several air-filled loops of minimally prominent small bowel are again noted. No evidence of progressive distention. Colonic gas pattern is normal. No free air. Electronically Signed   By: Marcello Moores  Register   On: 01/10/2016 08:34   Dg Abd 2 Views  Result Date: 01/09/2016 CLINICAL DATA:  Small-bowel obstruction follow-up. EXAM: ABDOMEN - 2 VIEW COMPARISON:  01/09/2016. FINDINGS: NG tube tip noted in the upper stomach. Side hole left gastroesophageal junction. Advancement of the NG tube approximately 6 -8 cm should be considered. Soft tissue structures are unremarkable. Persistent small bowel distention with interim improvement from prior exam. Colonic gas pattern normal. Stool noted in the colon and rectum. No free air. Degenerative changes lumbar spine and both hips. Surgical sutures are noted over the abdomen. IMPRESSION: 1.  Persistent but improving small bowel distention. 2. NG tube tip noted in the upper portion stomach. Side hole noted at the gastroesophageal junction. Advancement of the NG tube tip approximately 6 -8 cm should be considered. Electronically Signed   By: Marcello Moores  Register   On: 01/09/2016 08:38   Dg  Abd Portable 1v  Result Date: 01/09/2016 CLINICAL DATA:  PE 82 y/o  F; small bowel obstruction. EXAM: PORTABLE ABDOMEN - 1 VIEW COMPARISON:  CT of abdomen and pelvis dated 01/08/2016. FINDINGS: No pneumoperitoneum. Persistent dilated loops of small bowel and the mid and lower abdomen. Surgical sutures in material from anterior mesh repair. Bones are diffusely demineralized. Degenerative changes of the right greater than left hip joints  and lower lumbar spine. IMPRESSION: Persistent dilated loops of small bowel in the mid and lower abdomen. No pneumoperitoneum. Electronically Signed   By: Kristine Garbe M.D.   On: 01/09/2016 06:28    Medications: . cefTRIAXone (ROCEPHIN)  IV  2 g Intravenous Q24H  . chlorhexidine  15 mL Mouth/Throat BID  . metronidazole  500 mg Intravenous Q8H      Hx of abdominal hysterectomy (40 years ago - best guess) Hx of chronic pain on MS contin and percocet for some time (sciatica) Hx of chronic constipation Aortic stenosis Deconditioning and malnutrition Restless leg Some memory issues    Assessment/Plan Acute onset of nausea, vomiting, diarrhea, and some urinary symptoms SBO on CT with possible enteritis Hypokalemia - check Mag, and add K+ PO FEN:  NPO - DC NG, start clear liquids, if she does well advanced. This p.m. ID: Day 3 Meropenem DVT:  SCDs   Plan: DC NG tube now. Start clear liquids now advanced to full liquids this evening. She needs to be ambulating. Nursing staff reports requesting PT evaluation.       LOS: 2 days    Shelly Martin 01/10/2016 [phone]

## 2016-01-10 NOTE — Progress Notes (Signed)
PROGRESS NOTE    Shelly Martin  [id number] DOB: 1918-06-03 DOA: 01/08/2016 PCP: Kelton Pillar, MD   Brief Narrative:   Patient is a 82 year old who presented with SBO. Gen. surgery on board and assisting. There is transition zone with associated enteritis and as such patient has been placed on IV antibiotics.  Assessment & Plan:   Principal Problem:   SBO (small bowel obstruction) (HCC) - Continue supportive therapy. - General surgery assisting with management.  Enteritis - will transition to ceftriaxone and metronidazole  Active Problems:   History of TIAs  - No new focal neurological deficits. Stable   Hard of hearing   Aortic stenosis - Stable   Narcotic addiction (HCC)    Pressure ulcer - Continue current management  DVT prophylaxis: SCDs Code Status: DNR Family Communication: None at bedside Disposition Plan: Pending improvement in condition and specialist recommendations   Consultants:   General surgery   Procedures: None   Antimicrobials: Meropenem   Subjective: Patient has no new complaints. No acute issues overnight  Objective: Vitals:   01/09/16 1141 01/09/16 1508 01/09/16 2214 01/10/16 1526  BP: (!) 160/99 (!) 150/76 (!) 168/98 (!) 164/77  Pulse:  (!) 124 (!) 108 (!) 107  Resp:  19  19  Temp:  99 F (37.2 C) 99.7 F (37.6 C) 99 F (37.2 C)  TempSrc:  Oral Oral Oral  SpO2:  97% 96% 98%  Weight:      Height:        Intake/Output Summary (Last 24 hours) at 01/10/16 1619 Last data filed at 01/10/16 1314  Gross per 24 hour  Intake          1387.83 ml  Output              300 ml  Net          1087.83 ml   Filed Weights   01/08/16 1900  Weight: 42.5 kg (93 lb 11.1 oz)    Examination:  General exam: Appears calm and comfortable , In no acute distress Respiratory system: Clear to auscultation. Respiratory effort normal., Equal chest rise Cardiovascular system: S1 & S2 heard, RRR. No JVD, murmurs, rubs, gallops or  clicks. No pedal edema. Gastrointestinal system: Abdomen is nondistended, soft and nontender. No organomegaly or masses felt. Normal bowel sounds heard. Central nervous system: Alert and oriented. No focal neurological deficits. Extremities: Symmetric 5 x 5 power. Skin: No rashes, lesions or ulcers Psychiatry: Judgement and insight appear normal. Mood & affect appropriate.   Data Reviewed: I have personally reviewed following labs and imaging studies  CBC:  Recent Labs Lab 01/08/16 0412 01/09/16 0606 01/10/16 0838  WBC 26.4* 17.1* 15.1*  NEUTROABS 24.0*  --   --   HGB 12.2 12.5 11.9*  HCT 39.2 39.0 36.9  MCV 86.5 86.7 85.2  PLT 336 321 [id number]   Basic Metabolic Panel:  Recent Labs Lab 01/08/16 0412 01/09/16 0606 01/10/16 0838  NA 138 139 143  K 4.6 3.9 3.3*  CL 102 104 107  CO2 25 28 27   GLUCOSE 170* 142* 133*  BUN 53* 29* 29*  CREATININE 1.13* 0.75 0.71  CALCIUM 9.0 8.4* 8.8*  MG  --   --  2.0   GFR: Estimated Creatinine Clearance: 28.2 mL/min (by C-G formula based on SCr of 0.8 mg/dL). Liver Function Tests:  Recent Labs Lab 01/08/16 0412  AST 21  ALT 15  ALKPHOS 109  BILITOT 0.5  PROT 7.6  ALBUMIN 4.4    Recent  Labs Lab 01/08/16 0412  LIPASE 101*   No results for input(s): AMMONIA in the last 168 hours. Coagulation Profile: No results for input(s): INR, PROTIME in the last 168 hours. Cardiac Enzymes: No results for input(s): CKTOTAL, CKMB, CKMBINDEX, TROPONINI in the last 168 hours. BNP (last 3 results) No results for input(s): PROBNP in the last 8760 hours. HbA1C: No results for input(s): HGBA1C in the last 72 hours. CBG: No results for input(s): GLUCAP in the last 168 hours. Lipid Profile: No results for input(s): CHOL, HDL, LDLCALC, TRIG, CHOLHDL, LDLDIRECT in the last 72 hours. Thyroid Function Tests: No results for input(s): TSH, T4TOTAL, FREET4, T3FREE, THYROIDAB in the last 72 hours. Anemia Panel: No results for input(s): VITAMINB12,  FOLATE, FERRITIN, TIBC, IRON, RETICCTPCT in the last 72 hours. Sepsis Labs:  Recent Labs Lab 01/08/16 0502  LATICACIDVEN 2.79*    Recent Results (from the past 240 hour(s))  Urine culture     Status: Abnormal   Collection Time: 01/08/16  4:38 AM  Result Value Ref Range Status   Specimen Description URINE, CLEAN CATCH  Final   Special Requests NONE  Final   Culture MULTIPLE SPECIES PRESENT, SUGGEST RECOLLECTION (A)  Final   Report Status 01/09/2016 FINAL  Final         Radiology Studies: Dg Abd 2 Views  Result Date: 01/10/2016 CLINICAL DATA:  Bowel obstruction. EXAM: ABDOMEN - 2 VIEW COMPARISON:  01/09/2016. FINDINGS: Surgical sutures noted over the abdomen. NG tube noted with tip projected over the stomach. NG tube is been slightly advanced. Several air-filled loops of small bowel noted. No free air. Degenerative changes thoracolumbar spine. Terra changes both hips. IMPRESSION: 1. NG tube noted with its tip projected over the upper stomach. G tube is been slightly advanced. 2. Several air-filled loops of minimally prominent small bowel are again noted. No evidence of progressive distention. Colonic gas pattern is normal. No free air. Electronically Signed   By: Marcello Moores  Register   On: 01/10/2016 08:34   Dg Abd 2 Views  Result Date: 01/09/2016 CLINICAL DATA:  Small-bowel obstruction follow-up. EXAM: ABDOMEN - 2 VIEW COMPARISON:  01/09/2016. FINDINGS: NG tube tip noted in the upper stomach. Side hole left gastroesophageal junction. Advancement of the NG tube approximately 6 -8 cm should be considered. Soft tissue structures are unremarkable. Persistent small bowel distention with interim improvement from prior exam. Colonic gas pattern normal. Stool noted in the colon and rectum. No free air. Degenerative changes lumbar spine and both hips. Surgical sutures are noted over the abdomen. IMPRESSION: 1.  Persistent but improving small bowel distention. 2. NG tube tip noted in the upper portion  stomach. Side hole noted at the gastroesophageal junction. Advancement of the NG tube tip approximately 6 -8 cm should be considered. Electronically Signed   By: Marcello Moores  Register   On: 01/09/2016 08:38   Dg Abd Portable 1v  Result Date: 01/09/2016 CLINICAL DATA:  PE 82 y/o  F; small bowel obstruction. EXAM: PORTABLE ABDOMEN - 1 VIEW COMPARISON:  CT of abdomen and pelvis dated 01/08/2016. FINDINGS: No pneumoperitoneum. Persistent dilated loops of small bowel and the mid and lower abdomen. Surgical sutures in material from anterior mesh repair. Bones are diffusely demineralized. Degenerative changes of the right greater than left hip joints and lower lumbar spine. IMPRESSION: Persistent dilated loops of small bowel in the mid and lower abdomen. No pneumoperitoneum. Electronically Signed   By: Kristine Garbe M.D.   On: 01/09/2016 06:28  Scheduled Meds: . cefTRIAXone (ROCEPHIN)  IV  2 g Intravenous Q24H  . chlorhexidine  15 mL Mouth/Throat BID  . metronidazole  500 mg Intravenous Q8H  . potassium chloride  10 mEq Oral TID AC & HS   Continuous Infusions:     LOS: 2 days    Time spent: > 35 minutes  Velvet Bathe, MD [facility] Pager [phone]  If 7PM-7AM, please contact night-coverage [email or web address] Password [facility] 01/10/2016, 4:19 PM

## 2016-01-11 LAB — BASIC METABOLIC PANEL
Anion gap: 9 (ref 5–15)
BUN: 28 mg/dL — ABNORMAL HIGH (ref 6–20)
BUN: 28 mg/dL — AB (ref 4–21)
CALCIUM: 8.4 mg/dL — AB (ref 8.9–10.3)
CO2: 26 mmol/L (ref 22–32)
CREATININE: 0.74 mg/dL (ref 0.44–1.00)
CREATININE: 0.7 mg/dL (ref 0.5–1.1)
Chloride: 102 mmol/L (ref 101–111)
GFR calc Af Amer: >60 mL/min (ref >60)
GLUCOSE: 133 mg/dL — AB (ref 65–99)
GLUCOSE: 133 mg/dL
Potassium: 3 mmol/L — AB (ref 3.4–5.3)
Potassium: 3 mmol/L — ABNORMAL LOW (ref 3.5–5.1)
Sodium: 137 mmol/L (ref 135–145)
Sodium: 137 mmol/L (ref 137–147)

## 2016-01-11 LAB — CBC AND DIFFERENTIAL
HCT: 34 % — AB (ref 36–46)
Hemoglobin: 10.9 g/dL — AB (ref 12.0–16.0)
PLATELETS: 307 10*3/uL (ref 150–399)
WBC: 12.6 10*3/mL

## 2016-01-11 LAB — CBC
HEMATOCRIT: 33.5 % — AB (ref 36.0–46.0)
Hemoglobin: 10.9 g/dL — ABNORMAL LOW (ref 12.0–15.0)
MCH: 27.7 pg (ref 26.0–34.0)
MCHC: 32.5 g/dL (ref 30.0–36.0)
MCV: 85 fL (ref 78.0–100.0)
PLATELETS: 307 10*3/uL (ref 150–400)
RBC: 3.94 MIL/uL (ref 3.87–5.11)
RDW: 14.8 % (ref 11.5–15.5)
WBC: 12.6 10*3/uL — ABNORMAL HIGH (ref 4.0–10.5)

## 2016-01-11 MED ORDER — SACCHAROMYCES BOULARDII 250 MG PO CAPS
250.0000 mg | ORAL_CAPSULE | Freq: Two times a day (BID) | ORAL | Status: DC
  Administered 2016-01-11 – 2016-01-15 (×9): 250 mg via ORAL
  Filled 2016-01-11 (×9): qty 1

## 2016-01-11 MED ORDER — POTASSIUM CHLORIDE CRYS ER 20 MEQ PO TBCR
20.0000 meq | EXTENDED_RELEASE_TABLET | Freq: Three times a day (TID) | ORAL | Status: AC
  Administered 2016-01-11 – 2016-01-12 (×3): 20 meq via ORAL
  Filled 2016-01-11 (×3): qty 1

## 2016-01-11 MED ORDER — HEPARIN SODIUM (PORCINE) 5000 UNIT/ML IJ SOLN
5000.0000 [IU] | Freq: Three times a day (TID) | INTRAMUSCULAR | Status: DC
  Administered 2016-01-11 – 2016-01-15 (×13): 5000 [IU] via SUBCUTANEOUS
  Filled 2016-01-11 (×13): qty 1

## 2016-01-11 MED ORDER — LORAZEPAM 2 MG/ML IJ SOLN
0.5000 mg | Freq: Once | INTRAMUSCULAR | Status: AC
  Administered 2016-01-11: 0.5 mg via INTRAVENOUS
  Filled 2016-01-11: qty 1

## 2016-01-11 NOTE — Progress Notes (Signed)
PT Cancellation Note  Patient Details Name: Shelly Martin MRN: [id number] DOB: May 31, 1918   Cancelled Treatment:    Reason Eval/Treat Not Completed: Patient declined, no reason specified; pt adamantly refuses to get OOB or do any form of activity; she is not oriented to place or time of day, only  To self; attempted multiple ways to explain importance of mobility and pt continued to refuse; will attempt again next day or  as schedule permits   [facility] 01/11/2016, 12:51 PM

## 2016-01-11 NOTE — Progress Notes (Signed)
Subjective: She is doing better, still fairly weak.  Not taking much PO yet.  Some nausea this AM.  Ongoing low grade fever.     Objective: Vital signs in last 24 hours: Temp:  [99 F (37.2 C)-100 F (37.8 C)] 99.2 F (37.3 C) (08/18 0611) Pulse Rate:  [103-109] 107 (08/18 0611) Resp:  [18-19] 18 (08/18 0611) BP: (126-168)/(60-84) 168/84 (08/18 0611) SpO2:  [94 %-98 %] 94 % (08/18 0611) Last BM Date: 01/11/16 512 PO BM x 5 Tm 100 Labs pending Intake/Output from previous day: 08/17 0701 - 08/18 0700 In: 512 [P.O.:512] Out: 400 [Urine:100; Emesis/NG output:300] Intake/Output this shift: No intake/output data recorded.  General appearance: alert, cooperative and no distress GI: soft, + BS, having BM's.  Not obstructed now.   Lab Results:   Recent Labs  01/09/16 0606 01/10/16 0838  WBC 17.1* 15.1*  HGB 12.5 11.9*  HCT 39.0 36.9  PLT 321 311    BMET  Recent Labs  01/09/16 0606 01/10/16 0838  NA 139 143  K 3.9 3.3*  CL 104 107  CO2 28 27  GLUCOSE 142* 133*  BUN 29* 29*  CREATININE 0.75 0.71  CALCIUM 8.4* 8.8*   PT/INR No results for input(s): LABPROT, INR in the last 72 hours.   Recent Labs Lab 01/08/16 0412  AST 21  ALT 15  ALKPHOS 109  BILITOT 0.5  PROT 7.6  ALBUMIN 4.4     Lipase     Component Value Date/Time   LIPASE 101 (H) 01/08/2016 0412     Studies/Results: Dg Abd 2 Views  Result Date: 01/10/2016 CLINICAL DATA:  Bowel obstruction. EXAM: ABDOMEN - 2 VIEW COMPARISON:  01/09/2016. FINDINGS: Surgical sutures noted over the abdomen. NG tube noted with tip projected over the stomach. NG tube is been slightly advanced. Several air-filled loops of small bowel noted. No free air. Degenerative changes thoracolumbar spine. Terra changes both hips. IMPRESSION: 1. NG tube noted with its tip projected over the upper stomach. G tube is been slightly advanced. 2. Several air-filled loops of minimally prominent small bowel are again noted. No  evidence of progressive distention. Colonic gas pattern is normal. No free air. Electronically Signed   By: Marcello Moores  Register   On: 01/10/2016 08:34   Prior to Admission medications   Medication Sig Start Date End Date Taking? Authorizing Provider  Amino Acids-Protein Hydrolys (FEEDING SUPPLEMENT, PRO-STAT SUGAR FREE 64,) LIQD Take 30 mLs by mouth 2 (two) times daily.   Yes Historical Provider, MD  amLODipine (NORVASC) 5 MG tablet Take 5 mg by mouth daily.   Yes Historical Provider, MD  feeding supplement (BOOST / RESOURCE BREEZE) LIQD Take 1 Container by mouth 2 (two) times daily between meals.   Yes Historical Provider, MD  furosemide (LASIX) 20 MG tablet Take 40 mg by mouth daily.    Yes Historical Provider, MD  linaclotide (LINZESS) 145 MCG CAPS capsule Take 145 mcg by mouth daily before breakfast.   Yes Historical Provider, MD  magnesium hydroxide (MILK OF MAGNESIA) 400 MG/5ML suspension Take 30 mLs by mouth daily as needed for mild constipation. Reported on 07/27/2015   Yes Historical Provider, MD  mirtazapine (REMERON) 15 MG tablet Take 7.5 mg by mouth at bedtime.    Yes Historical Provider, MD  morphine (MS CONTIN) 15 MG 12 hr tablet Take 15 mg by mouth 2 (two) times daily.   Yes Historical Provider, MD  Multiple Vitamin (MULTIVITAMIN) tablet Take 1 tablet by mouth daily.   Yes  Historical Provider, MD  ondansetron (ZOFRAN) 4 MG tablet Take 4 mg by mouth every 12 (twelve) hours as needed for nausea or vomiting.   Yes Historical Provider, MD  oxyCODONE-acetaminophen (PERCOCET/ROXICET) 5-325 MG tablet Take 1 tablet by mouth 3 (three) times daily as needed. Breakthrough pain   Yes Historical Provider, MD  phenol (CHLORASEPTIC) 1.4 % LIQD Use as directed 1 spray in the mouth or throat 3 (three) times daily as needed for throat irritation / pain. If sore throat persist notify the MD   Yes Historical Provider, MD  potassium chloride (K-DUR) 10 MEQ tablet Take 20 mEq by mouth daily.    Yes Historical  Provider, MD  rOPINIRole (REQUIP) 2 MG tablet Take 4 mg by mouth at bedtime.   Yes Historical Provider, MD  sennosides-docusate sodium (SENOKOT-S) 8.6-50 MG tablet Take 1 tablet by mouth 2 (two) times daily. Hold for diarrhea   Yes Historical Provider, MD    Medications: . cefTRIAXone (ROCEPHIN)  IV  2 g Intravenous Q24H  . chlorhexidine  15 mL Mouth/Throat BID  . metronidazole  500 mg Intravenous Q8H  . potassium chloride  10 mEq Oral TID AC & HS  . rOPINIRole  8 mg Oral QHS    Hx of abdominal hysterectomy (40 years ago - best guess) Hx of chronic pain on MS contin and percocet for some time (sciatica) Hx of chronic constipation Aortic stenosis Deconditioning and malnutrition Restless leg Some memory issues   Assessment/Plan Acute onset of nausea, vomiting, diarrhea, and some urinary symptoms SBO on CT with possible enteritis Hypokalemia - check Mag, and add K+ PO  K+ 3.0 today FEN: Full liquids ID: Day 3 Meropenem converted to Flagyl/Rocephin 8/17 day 2 today DVT:  SCDs/add heparin for DVT.  Plan:  I would leaver her on full liquids, restart some of her home meds.  She has not been on MS here. Add probiotic. Mag OK, continue replacing K+ up to 4.0 range.    LOS: 3 days    Issaih Kaus 01/11/2016 [phone]

## 2016-01-11 NOTE — Progress Notes (Signed)
PROGRESS NOTE    Shelly Martin  [id number] DOB: 1918/12/28 DOA: 01/08/2016 PCP: Kelton Pillar, MD   Brief Narrative:   Patient is a 82 year old who presented with SBO. Gen. surgery on board and assisting. There is transition zone with associated enteritis and as such patient has been placed on IV antibiotics.  Assessment & Plan:   Principal Problem:   SBO (small bowel obstruction) (HCC) - Continue supportive therapy. Pt had some nausea recently - General surgery assisting with management.  Enteritis - will transition to ceftriaxone and metronidazole  Active Problems:   History of TIAs  - No new focal neurological deficits. Stable    Hard of hearing   Aortic stenosis - Stable    Narcotic addiction (HCC)    Pressure ulcer - Continue current management  DVT prophylaxis: SCDs Code Status: DNR Family Communication: None at bedside Disposition Plan: Pending improvement in condition and specialist recommendations   Consultants:   General surgery   Procedures: None   Antimicrobials: Meropenem   Subjective: Patient has no new complaints. No acute issues overnight  Objective: Vitals:   01/10/16 1850 01/10/16 2112 01/11/16 0611 01/11/16 1520  BP: 126/60 136/74 (!) 168/84 (!) 170/94  Pulse: (!) 109 (!) 103 (!) 107 (!) 106  Resp:  18 18 17   Temp:  100 F (37.8 C) 99.2 F (37.3 C) 98.4 F (36.9 C)  TempSrc:  Oral Oral Oral  SpO2:  97% 94% 95%  Weight:      Height:        Intake/Output Summary (Last 24 hours) at 01/11/16 1542 Last data filed at 01/11/16 1321  Gross per 24 hour  Intake              160 ml  Output                0 ml  Net              160 ml   Filed Weights   01/08/16 1900  Weight: 42.5 kg (93 lb 11.1 oz)    Examination:  General exam: Appears calm and comfortable , In no acute distress Respiratory system: Clear to auscultation. Respiratory effort normal., Equal chest rise Cardiovascular system: S1 & S2 heard, RRR. No  JVD, murmurs, rubs, gallops or clicks. No pedal edema. Gastrointestinal system: Abdomen is nondistended, soft and nontender. No organomegaly or masses felt. Normal bowel sounds heard. Central nervous system: Alert and oriented. No focal neurological deficits. Extremities: Symmetric 5 x 5 power. Skin: No rashes, lesions or ulcers Psychiatry: Judgement and insight appear normal. Mood & affect appropriate.   Data Reviewed: I have personally reviewed following labs and imaging studies  CBC:  Recent Labs Lab 01/08/16 0412 01/09/16 0606 01/10/16 0838 01/11/16 0751  WBC 26.4* 17.1* 15.1* 12.6*  NEUTROABS 24.0*  --   --   --   HGB 12.2 12.5 11.9* 10.9*  HCT 39.2 39.0 36.9 33.5*  MCV 86.5 86.7 85.2 85.0  PLT 336 321 311 [id number]   Basic Metabolic Panel:  Recent Labs Lab 01/08/16 0412 01/09/16 0606 01/10/16 0838 01/11/16 0751  NA 138 139 143 137  K 4.6 3.9 3.3* 3.0*  CL 102 104 107 102  CO2 25 28 27 26   GLUCOSE 170* 142* 133* 133*  BUN 53* 29* 29* 28*  CREATININE 1.13* 0.75 0.71 0.74  CALCIUM 9.0 8.4* 8.8* 8.4*  MG  --   --  2.0  --    GFR: Estimated Creatinine Clearance: 28.2 mL/min (by  C-G formula based on SCr of 0.8 mg/dL). Liver Function Tests:  Recent Labs Lab 01/08/16 0412  AST 21  ALT 15  ALKPHOS 109  BILITOT 0.5  PROT 7.6  ALBUMIN 4.4    Recent Labs Lab 01/08/16 0412  LIPASE 101*   No results for input(s): AMMONIA in the last 168 hours. Coagulation Profile: No results for input(s): INR, PROTIME in the last 168 hours. Cardiac Enzymes: No results for input(s): CKTOTAL, CKMB, CKMBINDEX, TROPONINI in the last 168 hours. BNP (last 3 results) No results for input(s): PROBNP in the last 8760 hours. HbA1C: No results for input(s): HGBA1C in the last 72 hours. CBG: No results for input(s): GLUCAP in the last 168 hours. Lipid Profile: No results for input(s): CHOL, HDL, LDLCALC, TRIG, CHOLHDL, LDLDIRECT in the last 72 hours. Thyroid Function Tests: No  results for input(s): TSH, T4TOTAL, FREET4, T3FREE, THYROIDAB in the last 72 hours. Anemia Panel: No results for input(s): VITAMINB12, FOLATE, FERRITIN, TIBC, IRON, RETICCTPCT in the last 72 hours. Sepsis Labs:  Recent Labs Lab 01/08/16 0502  LATICACIDVEN 2.79*    Recent Results (from the past 240 hour(s))  Urine culture     Status: Abnormal   Collection Time: 01/08/16  4:38 AM  Result Value Ref Range Status   Specimen Description URINE, CLEAN CATCH  Final   Special Requests NONE  Final   Culture MULTIPLE SPECIES PRESENT, SUGGEST RECOLLECTION (A)  Final   Report Status 01/09/2016 FINAL  Final         Radiology Studies: Dg Abd 2 Views  Result Date: 01/10/2016 CLINICAL DATA:  Bowel obstruction. EXAM: ABDOMEN - 2 VIEW COMPARISON:  01/09/2016. FINDINGS: Surgical sutures noted over the abdomen. NG tube noted with tip projected over the stomach. NG tube is been slightly advanced. Several air-filled loops of small bowel noted. No free air. Degenerative changes thoracolumbar spine. Terra changes both hips. IMPRESSION: 1. NG tube noted with its tip projected over the upper stomach. G tube is been slightly advanced. 2. Several air-filled loops of minimally prominent small bowel are again noted. No evidence of progressive distention. Colonic gas pattern is normal. No free air. Electronically Signed   By: Marcello Moores  Register   On: 01/10/2016 08:34        Scheduled Meds: . cefTRIAXone (ROCEPHIN)  IV  2 g Intravenous Q24H  . chlorhexidine  15 mL Mouth/Throat BID  . heparin subcutaneous  5,000 Units Subcutaneous Q8H  . metronidazole  500 mg Intravenous Q8H  . potassium chloride  20 mEq Oral TID  . rOPINIRole  8 mg Oral QHS  . saccharomyces boulardii  250 mg Oral BID   Continuous Infusions:     LOS: 3 days    Time spent: > 35 minutes  Velvet Bathe, MD [facility] Pager [phone]  If 7PM-7AM, please contact night-coverage [email or web address] Password [facility] 01/11/2016, 3:42  PM

## 2016-01-12 LAB — BASIC METABOLIC PANEL
Anion gap: 7 (ref 5–15)
BUN: 23 mg/dL — AB (ref 4–21)
BUN: 23 mg/dL — ABNORMAL HIGH (ref 6–20)
CALCIUM: 8.3 mg/dL — AB (ref 8.9–10.3)
CHLORIDE: 101 mmol/L (ref 101–111)
CO2: 27 mmol/L (ref 22–32)
CREATININE: 0.51 mg/dL (ref 0.44–1.00)
Creatinine: 0.5 mg/dL (ref 0.5–1.1)
Glucose, Bld: 120 mg/dL — ABNORMAL HIGH (ref 65–99)
Glucose: 120 mg/dL
Potassium: 3.7 mmol/L (ref 3.5–5.1)
SODIUM: 135 mmol/L — AB (ref 137–147)
SODIUM: 135 mmol/L (ref 135–145)

## 2016-01-12 LAB — CBC
HCT: 32.6 % — ABNORMAL LOW (ref 36.0–46.0)
Hemoglobin: 10.5 g/dL — ABNORMAL LOW (ref 12.0–15.0)
MCH: 27 pg (ref 26.0–34.0)
MCHC: 32.2 g/dL (ref 30.0–36.0)
MCV: 83.8 fL (ref 78.0–100.0)
PLATELETS: 250 10*3/uL (ref 150–400)
RBC: 3.89 MIL/uL (ref 3.87–5.11)
RDW: 14.5 % (ref 11.5–15.5)
WBC: 9.4 10*3/uL (ref 4.0–10.5)

## 2016-01-12 LAB — CBC AND DIFFERENTIAL: WBC: 9.4 10^3/mL

## 2016-01-12 MED ORDER — ADULT MULTIVITAMIN W/MINERALS CH
1.0000 | ORAL_TABLET | Freq: Every day | ORAL | Status: DC
  Administered 2016-01-12 – 2016-01-15 (×4): 1 via ORAL
  Filled 2016-01-12 (×4): qty 1

## 2016-01-12 MED ORDER — BISACODYL 10 MG RE SUPP
10.0000 mg | Freq: Every day | RECTAL | Status: DC
  Administered 2016-01-12 – 2016-01-13 (×2): 10 mg via RECTAL
  Filled 2016-01-12 (×2): qty 1

## 2016-01-12 MED ORDER — LINACLOTIDE 145 MCG PO CAPS
145.0000 ug | ORAL_CAPSULE | Freq: Every day | ORAL | Status: DC
  Administered 2016-01-13 – 2016-01-15 (×2): 145 ug via ORAL
  Filled 2016-01-12 (×3): qty 1

## 2016-01-12 MED ORDER — ALUM & MAG HYDROXIDE-SIMETH 200-200-20 MG/5ML PO SUSP: 30.0000 mL | Freq: Four times a day (QID) | ORAL | Status: DC | PRN

## 2016-01-12 MED ORDER — LIP MEDEX EX OINT
1.0000 "application " | TOPICAL_OINTMENT | Freq: Two times a day (BID) | CUTANEOUS | Status: DC
  Administered 2016-01-12 – 2016-01-15 (×7): 1 via TOPICAL
  Filled 2016-01-12 (×2): qty 7

## 2016-01-12 MED ORDER — BOOST / RESOURCE BREEZE PO LIQD
1.0000 | Freq: Two times a day (BID) | ORAL | Status: DC
  Administered 2016-01-12 – 2016-01-15 (×8): 1 via ORAL

## 2016-01-12 MED ORDER — ROPINIROLE HCL 1 MG PO TABS: 4.0000 mg | ORAL_TABLET | Freq: Every day | ORAL | Status: DC

## 2016-01-12 MED ORDER — MENTHOL 3 MG MT LOZG: 1.0000 | LOZENGE | OROMUCOSAL | Status: DC | PRN

## 2016-01-12 MED ORDER — LACTATED RINGERS IV BOLUS (SEPSIS): 1000.0000 mL | Freq: Three times a day (TID) | INTRAVENOUS | Status: AC | PRN

## 2016-01-12 MED ORDER — PRO-STAT SUGAR FREE PO LIQD
30.0000 mL | Freq: Two times a day (BID) | ORAL | Status: DC
  Administered 2016-01-12 – 2016-01-15 (×7): 30 mL via ORAL
  Filled 2016-01-12 (×7): qty 30

## 2016-01-12 MED ORDER — MIRTAZAPINE 15 MG PO TABS
7.5000 mg | ORAL_TABLET | Freq: Every day | ORAL | Status: DC
  Administered 2016-01-12 – 2016-01-13 (×2): 7.5 mg via ORAL
  Administered 2016-01-14: 15 mg via ORAL
  Filled 2016-01-12 (×3): qty 1

## 2016-01-12 MED ORDER — MAGIC MOUTHWASH
15.0000 mL | Freq: Four times a day (QID) | ORAL | Status: DC | PRN
  Filled 2016-01-12: qty 15

## 2016-01-12 NOTE — Progress Notes (Signed)
[address] [place], [place] [address] Phone: [phone] FAX: [phone]   [name] [id number] 16-Sep-1918  CARE TEAM:  PCP: Kelton Pillar, MD  Outpatient Care Team: Patient Care Team: Lanice Shirts, MD as PCP - General (Internal Medicine) [name], MD as Consulting Physician (Orthopedic Surgery) [name], MD as Consulting Physician (Otolaryngology)  Inpatient Treatment Team: Treatment Team: Attending Provider: Velvet Bathe, MD; Consulting Physician: Nolon Nations, MD; Technician: Colonel Bald, NT; Rounding Team: Ian Bushman, MD; Registered Nurse: Bailey Mech, RN; Registered Nurse: Scarlett Presto Pinnix, RN; Physical Therapist: Fuller Mandril, PT; Technician: Charlaine Dalton, NT; Registered Nurse: Danie Chandler, RN  Problem List:   Principal Problem:   SBO (small bowel obstruction) [facility]) Active Problems:   History of TIAs   Hard of hearing   Aortic stenosis   Narcotic addiction ([place])   Pressure ulcer           Assessment  PSBO ?eneteritis resolving  Plan:  -adv to soft diet -supp shakes -AXR 3 way with feels worse -try to lower chronic narcotics (= constipation) as tolerated ID:  Meropenem converted to Flagyl/Rocephin 8/17 day 2 today Hypokalemia - better  FEN: Full liquids DVT: SCDs/add heparin for DVT. -VTE prophylaxis- SCDs, etc -mobilize as tolerated to help recovery  Shelly Martin, M.D., F.A.C.S. Gastrointestinal and Minimally Invasive Surgery [facility] [address] [place], [place] [address] [phone] Main / Paging   01/12/2016  Subjective:  C/o sore hip No nausea Appetite fair  Objective:  Vital signs:  Vitals:   01/11/16 1520 01/11/16 1750 01/11/16 2037 01/12/16 0446  BP: (!) 170/94 137/73 (!) 149/67 (!) 179/57  Pulse: (!) 106 (!) 108 90 90  Resp: _0 Temp: 98.4 F (36.9 C)  98.4 F (36.9  C) 98.9 F (37.2 C)  TempSrc: Oral  Oral Oral  SpO2: 95%  97% 96%  Weight:      Height:        Last BM Date: 01/11/16  Intake/Output   Yesterday:  08/18 0701 - 08/19 0700 In: 350 [P.O.:350] Out: 1 [Urine:1] This shift:  Total I/O In: -  Out: 100 [Urine:100]  Bowel function:  Flatus: YES  BM:  No  Drain: (No drain)   Physical Exam:  General: Pt awake/alert/oriented x4 in No acute distress Eyes: PERRL, normal EOM.  Sclera clear.  No icterus Neuro: CN II-XII intact w/o focal sensory/motor deficits. Lymph: No head/neck/groin lymphadenopathy Psych:  No delerium/psychosis/paranoia HENT: Normocephalic, Mucus membranes moist.  No thrush.  HOH Neck: Supple, No tracheal deviation Chest: No chest wall pain w good excursion CV:  Pulses intact.  Regular rhythm MS: Normal AROM mjr joints.  No obvious deformity Abdomen: Soft.  Nondistended.  Nontender.  No evidence of peritonitis.  No incarcerated hernias. Ext:  SCDs BLE.  No mjr edema.  No cyanosis Skin: No petechiae / purpura  Results:   Labs: Results for orders placed or performed during the hospital encounter of 01/08/16 (from the past 48 hour(s))  CBC     Status: Abnormal   Collection Time: 01/11/16  7:51 AM  Result Value Ref Range   WBC 12.6 (H) 4.0 - 10.5 K/uL   RBC 3.94 3.87 - 5.11 MIL/uL   Hemoglobin 10.9 (L) 12.0 - 15.0 g/dL   HCT 33.5 (L) 36.0 - 46.0 %   MCV 85.0 78.0 - 100.0 fL   MCH  27.7 26.0 - 34.0 pg   MCHC 32.5 30.0 - 36.0 g/dL   RDW 14.8 11.5 - 15.5 %   Platelets 307 150 - 400 K/uL  Basic metabolic panel     Status: Abnormal   Collection Time: 01/11/16  7:51 AM  Result Value Ref Range   Sodium 137 135 - 145 mmol/L   Potassium 3.0 (L) 3.5 - 5.1 mmol/L   Chloride 102 101 - 111 mmol/L   CO2 26 22 - 32 mmol/L   Glucose, Bld 133 (H) 65 - 99 mg/dL   BUN 28 (H) 6 - 20 mg/dL   Creatinine, Ser 0.74 0.44 - 1.00 mg/dL   Calcium 8.4 (L) 8.9 - 10.3 mg/dL   GFR calc non Af Amer >60 >60 mL/min   GFR calc Af  Amer >60 >60 mL/min    Comment: (NOTE) The eGFR has been calculated using the CKD EPI equation. This calculation has not been validated in all clinical situations. eGFR's persistently <60 mL/min signify possible Chronic Kidney Disease.    Anion gap 9 5 - 15  Basic metabolic panel     Status: Abnormal   Collection Time: 01/12/16  5:36 AM  Result Value Ref Range   Sodium 135 135 - 145 mmol/L   Potassium 3.7 3.5 - 5.1 mmol/L    Comment: DELTA CHECK NOTED NO VISIBLE HEMOLYSIS    Chloride 101 101 - 111 mmol/L   CO2 27 22 - 32 mmol/L   Glucose, Bld 120 (H) 65 - 99 mg/dL   BUN 23 (H) 6 - 20 mg/dL   Creatinine, Ser 0.51 0.44 - 1.00 mg/dL   Calcium 8.3 (L) 8.9 - 10.3 mg/dL   GFR calc non Af Amer >60 >60 mL/min   GFR calc Af Amer >60 >60 mL/min    Comment: (NOTE) The eGFR has been calculated using the CKD EPI equation. This calculation has not been validated in all clinical situations. eGFR's persistently <60 mL/min signify possible Chronic Kidney Disease.    Anion gap 7 5 - 15  CBC     Status: Abnormal   Collection Time: 01/12/16  5:36 AM  Result Value Ref Range   WBC 9.4 4.0 - 10.5 K/uL   RBC 3.89 3.87 - 5.11 MIL/uL   Hemoglobin 10.5 (L) 12.0 - 15.0 g/dL   HCT 32.6 (L) 36.0 - 46.0 %   MCV 83.8 78.0 - 100.0 fL   MCH 27.0 26.0 - 34.0 pg   MCHC 32.2 30.0 - 36.0 g/dL   RDW 14.5 11.5 - 15.5 %   Platelets 250 150 - 400 K/uL    Imaging / Studies: No results found.  Medications / Allergies: per chart  Antibiotics: Anti-infectives    Start     Dose/Rate Route Frequency Ordered Stop   01/10/16 2200  cefTRIAXone (ROCEPHIN) 2 g in dextrose 5 % 50 mL IVPB     2 g 100 mL/hr over 30 Minutes Intravenous Every 24 hours 01/10/16 1042     01/10/16 2200  metroNIDAZOLE (FLAGYL) IVPB 500 mg     500 mg 100 mL/hr over 60 Minutes Intravenous Every 8 hours 01/10/16 1042     01/08/16 2200  meropenem (MERREM) 500 mg in sodium chloride 0.9 % 50 mL IVPB  Status:  Discontinued     500 mg 100  mL/hr over 30 Minutes Intravenous Every 12 hours 01/08/16 0945 01/10/16 1041   01/08/16 0815  meropenem (MERREM) 500 mg in sodium chloride 0.9 % 50 mL IVPB  500 mg 100 mL/hr over 30 Minutes Intravenous  Once 01/08/16 9924 01/08/16 1105        Note: Portions of this report may have been transcribed using voice recognition software. Every effort was made to ensure accuracy; however, inadvertent computerized transcription errors may be present.   Any transcriptional errors that result from this process are unintentional.     Shelly Martin, M.D., F.A.C.S. Gastrointestinal and Minimally Invasive Surgery [facility] [address] [place], [place] [address] [phone] Main / Paging   01/12/2016

## 2016-01-12 NOTE — Progress Notes (Signed)
PT Cancellation Note  Patient Details Name: Shelly Martin MRN: [id number] DOB: 1919-04-11   Cancelled Treatment:    Reason Eval/Treat Not Completed: Patient declined,  (states that she just got up  to recliner. She wants to mobilize more but not right at this time. Awaiting breakfast. Will check back later and attempt to ambulate.) Patient is from [employer].   Marcelino Freestone PT [id number]  01/12/2016, 8:52 AM

## 2016-01-12 NOTE — Progress Notes (Signed)
Pt is wanting to speak with daughter. Reports she has not heard from daughter for a while now. Call placed to daughter [name]) at [phone]. Unable to get daughter on the phone. Left a message for daughter to call this Probation officer or mom. Left both numbers for daughter to return call. VWilliams,rn.

## 2016-01-12 NOTE — Progress Notes (Signed)
PT Cancellation Note  Patient Details Name: Shenoa Bachtell MRN: [id number] DOB: 1918/11/29   Cancelled Treatment:    Reason Eval/Treat Not Completed: Fatigue/lethargy limiting ability to participate (back in bed and sound asleep/ check back tomorrow. Patient was limited ambulator PTA per patient, used WC to mobilize  Mostly.)   Claretha Cooper 01/12/2016, 5:18 PM

## 2016-01-12 NOTE — Progress Notes (Signed)
PROGRESS NOTE    Shelly Martin  [id number] DOB: December 04, 1918 DOA: 01/08/2016 PCP: Kelton Pillar, MD   Brief Narrative:   Patient is a 82 year old who presented with SBO. Gen. surgery on board and assisting. There is transition zone with associated enteritis and as such patient has been placed on IV antibiotics.  Assessment & Plan:   Principal Problem:   SBO (small bowel obstruction) (HCC) - Continue supportive therapy. Pt had some nausea recently - General surgery assisting with management.  Essential hypertension - Hydralazine prn  Enteritis - will transition to ceftriaxone and metronidazole  Active Problems:   History of TIAs  - No new focal neurological deficits. Stable    Hard of hearing   Aortic stenosis - Stable    Narcotic addiction (HCC)    Pressure ulcer - Continue current management  DVT prophylaxis: SCDs Code Status: DNR Family Communication: None at bedside Disposition Plan: Pending improvement in condition and specialist recommendations   Consultants:   General surgery   Procedures: None   Antimicrobials: Meropenem   Subjective: Patient has no new complaints. No acute issues overnight  Objective: Vitals:   01/11/16 1520 01/11/16 1750 01/11/16 2037 01/12/16 0446  BP: (!) 170/94 137/73 (!) 149/67 (!) 179/57  Pulse: (!) 106 (!) 108 90 90  Resp: 17  17 18   Temp: 98.4 F (36.9 C)  98.4 F (36.9 C) 98.9 F (37.2 C)  TempSrc: Oral  Oral Oral  SpO2: 95%  97% 96%  Weight:      Height:        Intake/Output Summary (Last 24 hours) at 01/12/16 1050 Last data filed at 01/12/16 0838  Gross per 24 hour  Intake              290 ml  Output              101 ml  Net              189 ml   Filed Weights   01/08/16 1900  Weight: 42.5 kg (93 lb 11.1 oz)    Examination:  General exam: Appears calm and comfortable , In no acute distress Respiratory system: Clear to auscultation. Respiratory effort normal., Equal chest  rise Cardiovascular system: S1 & S2 heard, RRR. No JVD, murmurs, rubs, gallops or clicks. No pedal edema. Gastrointestinal system: Abdomen is nondistended, soft and nontender. No organomegaly or masses felt. Normal bowel sounds heard. Central nervous system: Alert and oriented. No focal neurological deficits. Extremities: Symmetric 5 x 5 power. Skin: No rashes, lesions or ulcers Psychiatry: Judgement and insight appear normal. Mood & affect appropriate.   Data Reviewed: I have personally reviewed following labs and imaging studies  CBC:  Recent Labs Lab 01/08/16 0412 01/09/16 0606 01/10/16 0838 01/11/16 0751 01/12/16 0536  WBC 26.4* 17.1* 15.1* 12.6* 9.4  NEUTROABS 24.0*  --   --   --   --   HGB 12.2 12.5 11.9* 10.9* 10.5*  HCT 39.2 39.0 36.9 33.5* 32.6*  MCV 86.5 86.7 85.2 85.0 83.8  PLT 336 321 311 307 [id number]   Basic Metabolic Panel:  Recent Labs Lab 01/08/16 0412 01/09/16 0606 01/10/16 0838 01/11/16 0751 01/12/16 0536  NA 138 139 143 137 135  K 4.6 3.9 3.3* 3.0* 3.7  CL 102 104 107 102 101  CO2 25 28 27 26 27   GLUCOSE 170* 142* 133* 133* 120*  BUN 53* 29* 29* 28* 23*  CREATININE 1.13* 0.75 0.71 0.74 0.51  CALCIUM 9.0 8.4* 8.8*  8.4* 8.3*  MG  --   --  2.0  --   --    GFR: Estimated Creatinine Clearance: 28.2 mL/min (by C-G formula based on SCr of 0.8 mg/dL). Liver Function Tests:  Recent Labs Lab 01/08/16 0412  AST 21  ALT 15  ALKPHOS 109  BILITOT 0.5  PROT 7.6  ALBUMIN 4.4    Recent Labs Lab 01/08/16 0412  LIPASE 101*   No results for input(s): AMMONIA in the last 168 hours. Coagulation Profile: No results for input(s): INR, PROTIME in the last 168 hours. Cardiac Enzymes: No results for input(s): CKTOTAL, CKMB, CKMBINDEX, TROPONINI in the last 168 hours. BNP (last 3 results) No results for input(s): PROBNP in the last 8760 hours. HbA1C: No results for input(s): HGBA1C in the last 72 hours. CBG: No results for input(s): GLUCAP in the last 168  hours. Lipid Profile: No results for input(s): CHOL, HDL, LDLCALC, TRIG, CHOLHDL, LDLDIRECT in the last 72 hours. Thyroid Function Tests: No results for input(s): TSH, T4TOTAL, FREET4, T3FREE, THYROIDAB in the last 72 hours. Anemia Panel: No results for input(s): VITAMINB12, FOLATE, FERRITIN, TIBC, IRON, RETICCTPCT in the last 72 hours. Sepsis Labs:  Recent Labs Lab 01/08/16 0502  LATICACIDVEN 2.79*    Recent Results (from the past 240 hour(s))  Urine culture     Status: Abnormal   Collection Time: 01/08/16  4:38 AM  Result Value Ref Range Status   Specimen Description URINE, CLEAN CATCH  Final   Special Requests NONE  Final   Culture MULTIPLE SPECIES PRESENT, SUGGEST RECOLLECTION (A)  Final   Report Status 01/09/2016 FINAL  Final         Radiology Studies: No results found.      Scheduled Meds: . bisacodyl  10 mg Rectal Daily  . cefTRIAXone (ROCEPHIN)  IV  2 g Intravenous Q24H  . chlorhexidine  15 mL Mouth/Throat BID  . feeding supplement  1 Container Oral BID BM  . feeding supplement (PRO-STAT SUGAR FREE 64)  30 mL Oral BID  . heparin subcutaneous  5,000 Units Subcutaneous Q8H  . [START ON 01/13/2016] linaclotide  145 mcg Oral QAC breakfast  . lip balm  1 application Topical BID  . metronidazole  500 mg Intravenous Q8H  . mirtazapine  7.5 mg Oral QHS  . multivitamin with minerals  1 tablet Oral Daily  . potassium chloride  20 mEq Oral TID  . rOPINIRole  8 mg Oral QHS  . saccharomyces boulardii  250 mg Oral BID   Continuous Infusions:     LOS: 4 days    Time spent: > 35 minutes  Velvet Bathe, MD [facility] Pager [phone]  If 7PM-7AM, please contact night-coverage [email or web address] Password [email or web address] 01/12/2016, 10:50 AM

## 2016-01-13 ENCOUNTER — Encounter: Payer: Self-pay | Admitting: Internal Medicine

## 2016-01-13 MED ORDER — PSYLLIUM 95 % PO PACK
1.0000 | PACK | Freq: Every day | ORAL | Status: DC
  Administered 2016-01-13 – 2016-01-15 (×3): 1 via ORAL
  Filled 2016-01-13 (×3): qty 1

## 2016-01-13 MED ORDER — BISMUTH SUBSALICYLATE 262 MG/15ML PO SUSP
30.0000 mL | Freq: Two times a day (BID) | ORAL | Status: DC
  Administered 2016-01-13 – 2016-01-14 (×4): 30 mL via ORAL
  Filled 2016-01-13: qty 118

## 2016-01-13 MED ORDER — ACETAMINOPHEN 325 MG PO TABS
650.0000 mg | ORAL_TABLET | Freq: Four times a day (QID) | ORAL | Status: DC | PRN
  Administered 2016-01-13 – 2016-01-14 (×2): 650 mg via ORAL
  Filled 2016-01-13 (×2): qty 2

## 2016-01-13 NOTE — Assessment & Plan Note (Deleted)
Pt now on senokot -s BID and linzess 145 mcg daily with good control;cont current meds

## 2016-01-13 NOTE — Progress Notes (Signed)
PROGRESS NOTE    Shelly Martin  [id number] DOB: [birth year] DOA: 01/08/2016 PCP: Kelton Pillar, MD   Brief Narrative:   Patient is a 82 year old who presented with SBO. Gen. surgery on board and assisting. There is transition zone with associated enteritis and as such patient has been placed on IV antibiotics.  Assessment & Plan:   Principal Problem:   SBO (small bowel obstruction) (HCC) - Continue supportive therapy.  - General surgery assisting with management and advancing diet.   Essential hypertension - Hydralazine prn  Enteritis - will transition to ceftriaxone and metronidazole  Active Problems:   History of TIAs  - No new focal neurological deficits. Stable    Hard of hearing   Aortic stenosis - Stable    Narcotic addiction (HCC)    Pressure ulcer - Continue current management  DVT prophylaxis: SCDs Code Status: DNR Family Communication: None at bedside Disposition Plan: Pending improvement in condition and specialist recommendations   Consultants:   General surgery   Procedures: None   Antimicrobials: Meropenem   Subjective: Patient has no new complaints. No new problems reported.  Objective: Vitals:   01/13/16 0456 01/13/16 0459 01/13/16 0617 01/13/16 1155  BP: (!) 168/89 (!) 172/92 (!) 144/64 (!) 161/76  Pulse: 85 83 96 96  Resp: 18     Temp: 98.9 F (37.2 C)     TempSrc: Oral     SpO2: 95%   96%  Weight: 41.1 kg (90 lb 9.7 oz)     Height:        Intake/Output Summary (Last 24 hours) at 01/13/16 1604 Last data filed at 01/13/16 1413  Gross per 24 hour  Intake              790 ml  Output                0 ml  Net              790 ml   Filed Weights   01/08/16 1900 01/13/16 0456  Weight: 42.5 kg (93 lb 11.1 oz) 41.1 kg (90 lb 9.7 oz)    Examination:  General exam: Appears calm and comfortable , In no acute distress Respiratory system: Clear to auscultation. Respiratory effort normal., Equal chest  rise Cardiovascular system: S1 & S2 heard, RRR. No JVD, murmurs, rubs, gallops or clicks. No pedal edema. Gastrointestinal system: Abdomen is nondistended, soft and nontender. No organomegaly or masses felt. Normal bowel sounds heard. Central nervous system: Alert and oriented. No focal neurological deficits. Extremities: Symmetric 5 x 5 power. Skin: No rashes, lesions or ulcers Psychiatry: Judgement and insight appear normal. Mood & affect appropriate.   Data Reviewed: I have personally reviewed following labs and imaging studies  CBC:  Recent Labs Lab 01/08/16 0412 01/09/16 0606 01/10/16 0838 01/11/16 0751 01/12/16 0536  WBC 26.4* 17.1* 15.1* 12.6* 9.4  NEUTROABS 24.0*  --   --   --   --   HGB 12.2 12.5 11.9* 10.9* 10.5*  HCT 39.2 39.0 36.9 33.5* 32.6*  MCV 86.5 86.7 85.2 85.0 83.8  PLT 336 321 311 307 [id number]   Basic Metabolic Panel:  Recent Labs Lab 01/08/16 0412 01/09/16 0606 01/10/16 0838 01/11/16 0751 01/12/16 0536  NA 138 139 143 137 135  K 4.6 3.9 3.3* 3.0* 3.7  CL 102 104 107 102 101  CO2 25 28 27 26 27   GLUCOSE 170* 142* 133* 133* 120*  BUN 53* 29* 29* 28* 23*  CREATININE 1.13* 0.75  0.71 0.74 0.51  CALCIUM 9.0 8.4* 8.8* 8.4* 8.3*  MG  --   --  2.0  --   --    GFR: Estimated Creatinine Clearance: 27.3 mL/min (by C-G formula based on SCr of 0.8 mg/dL). Liver Function Tests:  Recent Labs Lab 01/08/16 0412  AST 21  ALT 15  ALKPHOS 109  BILITOT 0.5  PROT 7.6  ALBUMIN 4.4    Recent Labs Lab 01/08/16 0412  LIPASE 101*   No results for input(s): AMMONIA in the last 168 hours. Coagulation Profile: No results for input(s): INR, PROTIME in the last 168 hours. Cardiac Enzymes: No results for input(s): CKTOTAL, CKMB, CKMBINDEX, TROPONINI in the last 168 hours. BNP (last 3 results) No results for input(s): PROBNP in the last 8760 hours. HbA1C: No results for input(s): HGBA1C in the last 72 hours. CBG: No results for input(s): GLUCAP in the last 168  hours. Lipid Profile: No results for input(s): CHOL, HDL, LDLCALC, TRIG, CHOLHDL, LDLDIRECT in the last 72 hours. Thyroid Function Tests: No results for input(s): TSH, T4TOTAL, FREET4, T3FREE, THYROIDAB in the last 72 hours. Anemia Panel: No results for input(s): VITAMINB12, FOLATE, FERRITIN, TIBC, IRON, RETICCTPCT in the last 72 hours. Sepsis Labs:  Recent Labs Lab 01/08/16 0502  LATICACIDVEN 2.79*    Recent Results (from the past 240 hour(s))  Urine culture     Status: Abnormal   Collection Time: 01/08/16  4:38 AM  Result Value Ref Range Status   Specimen Description URINE, CLEAN CATCH  Final   Special Requests NONE  Final   Culture MULTIPLE SPECIES PRESENT, SUGGEST RECOLLECTION (A)  Final   Report Status 01/09/2016 FINAL  Final         Radiology Studies: No results found.      Scheduled Meds: . bismuth subsalicylate  30 mL Oral BID  . cefTRIAXone (ROCEPHIN)  IV  2 g Intravenous Q24H  . chlorhexidine  15 mL Mouth/Throat BID  . feeding supplement  1 Container Oral BID BM  . feeding supplement (PRO-STAT SUGAR FREE 64)  30 mL Oral BID  . heparin subcutaneous  5,000 Units Subcutaneous Q8H  . linaclotide  145 mcg Oral QAC breakfast  . lip balm  1 application Topical BID  . metronidazole  500 mg Intravenous Q8H  . mirtazapine  7.5 mg Oral QHS  . multivitamin with minerals  1 tablet Oral Daily  . psyllium  1 packet Oral Daily  . rOPINIRole  8 mg Oral QHS  . saccharomyces boulardii  250 mg Oral BID   Continuous Infusions:     LOS: 5 days    Time spent: > 35 minutes  Velvet Bathe, MD [facility] Pager [phone]  If 7PM-7AM, please contact night-coverage [email or web address] Password [email or web address] 01/13/2016, 4:04 PM

## 2016-01-13 NOTE — Assessment & Plan Note (Deleted)
Good control on norvasc 5 mg and lasix 40 mg daily;plan - cont current meds

## 2016-01-13 NOTE — Progress Notes (Signed)
[facility]  [address] [place], [place] [address] Phone: [phone] FAX: [phone]   Shelly Martin [id number] [birth year]  CARE TEAM:  PCP: Kelton Pillar, MD  Outpatient Care Team: Patient Care Team: Lanice Shirts, MD as PCP - General (Internal Medicine) [name], MD as Consulting Physician (Orthopedic Surgery) [name], MD as Consulting Physician (Otolaryngology)  Inpatient Treatment Team: Treatment Team: Attending Provider: Velvet Bathe, MD; Consulting Physician: Nolon Nations, MD; Technician: Colonel Bald, NT; Rounding Team: Ian Bushman, MD; Registered Nurse: Bailey Mech, RN; Registered Nurse: Scarlett Presto Pinnix, RN; Technician: Charlaine Dalton, NT; Registered Nurse: Kizzie Ide, RN  Problem List:   Principal Problem:   SBO (small bowel obstruction) [facility]) Active Problems:   History of TIAs   Hard of hearing   Aortic stenosis   Narcotic addiction ([place])   Pressure ulcer           Assessment  PSBO ?enteritis resolved  Diarrhea  Plan:  -adv to [place] solid diet -supp shakes -fiber & Pepto regimen to thicken stools.  Try to hold off on imodium. -AXR 3 way if feels worse -try to lower chronic narcotics (= constipation) as tolerated ID:  Shelly Martin converted to Flagyl/Rocephin x 3/? Days.  WBC better.  Defer to medicine Hypokalemia - better  FEN:  DVT: SCDs/add heparin for DVT. -VTE prophylaxis- SCDs, etc -mobilize as tolerated to help recovery  [name], M.D., F.A.C.S. Gastrointestinal and Minimally Invasive Surgery [facility] [address] [place],  [address] [phone] Main / Paging   01/13/2016  Subjective:  C/o sore hip No nausea Appetite fair C/o diarrhea  Objective:  Vital signs:  Vitals:   01/13/16 0456 01/13/16 0459 01/13/16 0617 01/13/16 1155  BP: (!) 168/89 (!) 172/92 (!) 144/64 (!) 161/76  Pulse: 85  83 96 96  Resp: 18     Temp: 98.9 F (37.2 C)     TempSrc: Oral     SpO2: 95%   96%  Weight: 41.1 kg (90 lb 9.7 oz)     Height:        Last BM Date: 01/11/16  Intake/Output   Yesterday:  08/19 0701 - 08/20 0700 In: 15 [P.O.:360; IV Piggyback:100] Out: 200 [Urine:200] This shift:  Total I/O In: 60 [P.O.:60] Out: -   Bowel function:  Flatus: YES  BM:  YES - many loose  Drain: (No drain)   Physical Exam:  General: Pt awake/alert/oriented x4 in No acute distress Eyes: PERRL, normal EOM.  Sclera clear.  No icterus Neuro: CN II-XII intact w/o focal sensory/motor deficits. Lymph: No head/neck/groin lymphadenopathy Psych:  No delerium/psychosis/paranoia HENT: Normocephalic, Mucus membranes moist.  No thrush.  HOH Neck: Supple, No tracheal deviation Chest: No chest wall pain w good excursion CV:  Pulses intact.  Regular rhythm MS: Normal AROM mjr joints.  No obvious deformity Abdomen: Soft.  Nondistended.  Nontender.  No evidence of peritonitis.  No incarcerated hernias. Ext:  SCDs BLE.  No mjr edema.  No cyanosis Skin: No petechiae / purpura  Results:   Labs: Results for orders placed or performed during the hospital encounter of 01/08/16 (from the past 48 hour(s))  Basic metabolic panel     Status: Abnormal   Collection Time: 01/12/16  5:36 AM  Result Value Ref Range   Sodium 135 135 - 145 mmol/L   Potassium 3.7 3.5 - 5.1 mmol/L    Comment: DELTA CHECK NOTED  NO VISIBLE HEMOLYSIS    Chloride 101 101 - 111 mmol/L   CO2 27 22 - 32 mmol/L   Glucose, Bld 120 (H) 65 - 99 mg/dL   BUN 23 (H) 6 - 20 mg/dL   Creatinine, Ser 0.51 0.44 - 1.00 mg/dL   Calcium 8.3 (L) 8.9 - 10.3 mg/dL   GFR calc non Af Amer >60 >60 mL/min   GFR calc Af Amer >60 >60 mL/min    Comment: (NOTE) The eGFR has been calculated using the CKD EPI equation. This calculation has not been validated in all clinical situations. eGFR's persistently <60 mL/min signify possible Chronic  Kidney Disease.    Anion gap 7 5 - 15  CBC     Status: Abnormal   Collection Time: 01/12/16  5:36 AM  Result Value Ref Range   WBC 9.4 4.0 - 10.5 K/uL   RBC 3.89 3.87 - 5.11 MIL/uL   Hemoglobin 10.5 (L) 12.0 - 15.0 g/dL   HCT 32.6 (L) 36.0 - 46.0 %   MCV 83.8 78.0 - 100.0 fL   MCH 27.0 26.0 - 34.0 pg   MCHC 32.2 30.0 - 36.0 g/dL   RDW 14.5 11.5 - 15.5 %   Platelets 250 150 - 400 K/uL    Imaging / Studies: No results found.  Medications / Allergies: per chart  Antibiotics: Anti-infectives    Start     Dose/Rate Route Frequency Ordered Stop   01/10/16 2200  cefTRIAXone (ROCEPHIN) 2 g in dextrose 5 % 50 mL IVPB     2 g 100 mL/hr over 30 Minutes Intravenous Every 24 hours 01/10/16 1042     01/10/16 2200  metroNIDAZOLE (FLAGYL) IVPB 500 mg     500 mg 100 mL/hr over 60 Minutes Intravenous Every 8 hours 01/10/16 1042     01/08/16 2200  Shelly Martin (MERREM) 500 mg in sodium chloride 0.9 % 50 mL IVPB  Status:  Discontinued     500 mg 100 mL/hr over 30 Minutes Intravenous Every 12 hours 01/08/16 0945 01/10/16 1041   01/08/16 0815  Shelly Martin (MERREM) 500 mg in sodium chloride 0.9 % 50 mL IVPB     500 mg 100 mL/hr over 30 Minutes Intravenous  Once 01/08/16 0808 01/08/16 1105        Note: Portions of this report may have been transcribed using voice recognition software. Every effort was made to ensure accuracy; however, inadvertent computerized transcription errors may be present.   Any transcriptional errors that result from this process are unintentional.     [name], M.D., F.A.C.S. Gastrointestinal and Minimally Invasive Surgery [facility] [address] [place], [place] [address] [phone] Main / Paging   01/13/2016

## 2016-01-13 NOTE — Assessment & Plan Note (Deleted)
Chronic; pt was referred to pain clinic and they have decreased her medications for pain and she has done well with a good attitude; plan - cont MS Contin 15 mg q 12 amd percocet 5/325 po TID prn

## 2016-01-14 NOTE — Progress Notes (Signed)
Nutrition Follow-up  DOCUMENTATION CODES:   Severe malnutrition in context of chronic illness  INTERVENTION:  -Continue Boost Breeze po TID, each supplement provides 250 kcal and 9 grams of protein -Continue Pro-Stat 31mL BID -Multivitamin with minerals  NUTRITION DIAGNOSIS:   Malnutrition (Severe) related to chronic illness as evidenced by severe depletion of body fat, severe depletion of muscle mass. -ongoing  GOAL:   Patient will meet greater than or equal to 90% of their needs -progressing  MONITOR:   PO intake, Supplement acceptance, I & O's, Weight trends  REASON FOR ASSESSMENT:   Malnutrition Screening Tool    ASSESSMENT:   Pt with hx of chronic back pain, hypertension, TIA, mild memory loss, otherwise healthy for her age comes in with on and off nausea for the last few days, since yesterday she has developed some lower quadrant abdominal pain associated with worsening nausea. CT scan suggestive of ileitis with small bowel obstruction, possible transition point.   Ms. Kesten states she is "just starting to feel like herself today." Helped her order grilled chicken and a sweet potato for lunch. She a bagel with cream cheese and a slice of orange this morning that she ate 1/4 of. Consuming boost breeze regularly Was concerned with going back to [employer], she doesn't like the food there. Encouraged her to find a few things she make like and try to stick to those, eat snacks, and consume boost breeze there as well. Encouraged her to eat as much as she could during her stay, she understands she needs to gain weight.  Labs and medications reviewed: Metamucil  Diet Order:  Diet Heart Room service appropriate? Yes; Fluid consistency: Thin  Skin:  Wound (see comment) (stage I on sacrum)  Last BM:  8/16  Height:   Ht Readings from Last 1 Encounters:  01/08/16 5' (1.524 m)    Weight:   Wt Readings from Last 1 Encounters:  01/13/16 90 lb 9.7 oz (41.1 kg)     Ideal Body Weight:  45.4 kg  BMI:  Body mass index is 17.7 kg/m.  Estimated Nutritional Needs:   Kcal:  [id number]  Protein:  60-70 grams  Fluid:  > 1.5 L/day  EDUCATION NEEDS:   No education needs identified at this time  Satira Anis. Kristl Morioka, MS, RD LDN Inpatient Clinical Dietitian Pager [phone]

## 2016-01-14 NOTE — Evaluation (Signed)
Physical Therapy Evaluation Patient Details Name: Shelly Martin MRN: [id number] DOB: 10-16-1918 Today's Date: 01/14/2016   History of Present Illness  Patient is a 82 year old who presented with SBO.    PMHx: Chronic back pain, hypertension, TIA, mild memory loss  Clinical Impression  Pt admitted with above diagnosis. Pt currently with functional limitations due to the deficits listed below (see PT Problem List). Min to Mod assist for bed mobility and to transfer to recliner, pt is non-ambulatory at baseline. Return to SNF recommended.  Pt will benefit from skilled PT to increase their independence and safety with mobility to allow discharge to the venue listed below.       Follow Up Recommendations SNF    Equipment Recommendations  None recommended by PT    Recommendations for Other Services       Precautions / Restrictions Precautions Precautions: Fall Restrictions Weight Bearing Restrictions: No      Mobility  Bed Mobility Overal bed mobility: Needs Assistance Bed Mobility: Supine to Sit     Supine to sit: Mod assist     General bed mobility comments: assist to raise trunk and advance BLEs  Transfers Overall transfer level: Needs assistance Equipment used: Standard walker Transfers: Sit to/from Stand;Stand Pivot Transfers Sit to Stand: Mod assist Stand pivot transfers: Min assist       General transfer comment: mod A to rise/steady, mildly dizzy initially upon standing, min A to take 3 pivotal steps to recliner; HR 116 at rest, 125 with activity  Ambulation/Gait                Stairs            Wheelchair Mobility    Modified Rankin (Stroke Patients Only)       Balance Overall balance assessment: Needs assistance   Sitting balance-Leahy Scale: Fair       Standing balance-Leahy Scale: Poor                               Pertinent Vitals/Pain Pain Assessment: 0-10 Pain Score: 10-Worst pain ever Pain Location: R  shoulder and hip Pain Descriptors / Indicators: Sharp Pain Intervention(s): Repositioned;Patient requesting pain meds-RN notified    Home Living Family/patient expects to be discharged to:: Skilled nursing facility                      Prior Function Level of Independence: Needs assistance   Gait / Transfers Assistance Needed: assistance to pivot to [facility] with RW; non-ambulatory x 1 year due to "sciatica in my hip and shoulder" per pt  ADL's / Homemaking Assistance Needed: asisstance to bathe, dress        Hand Dominance   Dominant Hand: Right    Extremity/Trunk Assessment   Upper Extremity Assessment: Generalized weakness           Lower Extremity Assessment: Generalized weakness      Cervical / Trunk Assessment: Kyphotic  Communication   Communication: HOH  Cognition Arousal/Alertness: Awake/alert Behavior During Therapy: WFL for tasks assessed/performed Overall Cognitive Status: No family/caregiver present to determine baseline cognitive functioning (oriented to self and location, some short term memory deficits, reached for boxed drink on table and asked if it was the phone )       Memory: Decreased short-term memory              General Comments      Exercises  Assessment/Plan    PT Assessment Patient needs continued PT services  PT Diagnosis Generalized weakness;Acute pain   PT Problem List Decreased activity tolerance;Decreased balance;Decreased mobility;Decreased cognition;Pain  PT Treatment Interventions Functional mobility training;Balance training;Therapeutic exercise;Therapeutic activities;Patient/family education   PT Goals (Current goals can be found in the Care Plan section) Acute Rehab PT Goals Patient Stated Goal: to get my strength back PT Goal Formulation: With patient Time For Goal Achievement: 01/28/16 Potential to Achieve Goals: Good    Frequency Min 3X/week   Barriers to discharge        Co-evaluation                End of Session Equipment Utilized During Treatment: Gait belt Activity Tolerance: Patient tolerated treatment well Patient left: in chair;with call bell/phone within reach;with chair alarm set Nurse Communication: Mobility status         Time: [id number] PT Time Calculation (min) (ACUTE ONLY): 21 min   Charges:   PT Evaluation $PT Eval Low Complexity: 1 Procedure     PT G CodesPhilomena Doheny 01/14/2016, 10:23 AM [phone]

## 2016-01-14 NOTE — Care Management Important Message (Signed)
Important Message  Patient Details  Name: Canary Madhavan MRN: [id number] Date of Birth: [birth year]   Medicare Important Message Given:  Yes    Camillo Flaming 01/14/2016, 10:34 AMImportant Message  Patient Details  Name: Roann Danger MRN: [id number] Date of Birth: March 02, 1919   Medicare Important Message Given:  Yes    Camillo Flaming 01/14/2016, 10:34 AM

## 2016-01-14 NOTE — Progress Notes (Signed)
PROGRESS NOTE    Shelly Martin  [id number] DOB: [birth year] DOA: 01/08/2016 PCP: Kelton Pillar, MD   Brief Narrative:   Patient is a 82 year old who presented with SBO. Gen. surgery on board and assisting. There is transition zone with associated enteritis and as such patient has been placed on IV antibiotics.  Assessment & Plan:   Principal Problem:   SBO (small bowel obstruction) (HCC) - Continue supportive therapy.  - General surgery assisted and signed off - Pt complaining of nausea, will monitor for the rest of the day. If continued or worsened nausea will plan on further imaging studies.   Essential hypertension - Hydralazine prn  Enteritis - will transition to ceftriaxone and metronidazole  Active Problems:   History of TIAs  - No new focal neurological deficits. Stable    Hard of hearing   Aortic stenosis - Stable    Narcotic addiction (HCC)    Pressure ulcer - Continue current management  DVT prophylaxis: SCDs Code Status: DNR Family Communication: None at bedside Disposition Plan: d/c possibly tomorrow.    Consultants:   General surgery   Procedures: None   Antimicrobials: Meropenem   Subjective: Still c/o nausea. States she has not been ambulating much  Objective: Vitals:   01/13/16 2221 01/14/16 0505 01/14/16 0825 01/14/16 1507  BP: (!) 159/69 (!) 161/70 (!) 171/75 129/62  Pulse: 94 80 87 93  Resp: 19 20 18 18   Temp: 99.3 F (37.4 C) 98.8 F (37.1 C)  98.6 F (37 C)  TempSrc: Oral Oral  Oral  SpO2: 99% 96%  98%  Weight:      Height:        Intake/Output Summary (Last 24 hours) at 01/14/16 1650 Last data filed at 01/14/16 [id number]  Gross per 24 hour  Intake              340 ml  Output                0 ml  Net              340 ml   Filed Weights   01/08/16 1900 01/13/16 0456  Weight: 42.5 kg (93 lb 11.1 oz) 41.1 kg (90 lb 9.7 oz)    Examination:  General exam: Appears calm and comfortable , In no acute  distress Respiratory system: Clear to auscultation. Respiratory effort normal., Equal chest rise Cardiovascular system: S1 & S2 heard, RRR. No JVD, murmurs, rubs, gallops or clicks. No pedal edema. Gastrointestinal system: Abdomen is nondistended, soft and nontender. No organomegaly or masses felt. Normal bowel sounds heard. Central nervous system: Alert and oriented. No focal neurological deficits. Extremities: Symmetric 5 x 5 power. Skin: No rashes, lesions or ulcers Psychiatry: Judgement and insight appear normal. Mood & affect appropriate.   Data Reviewed: I have personally reviewed following labs and imaging studies  CBC:  Recent Labs Lab 01/08/16 0412 01/09/16 0606 01/10/16 0838 01/11/16 0751 01/12/16 0536  WBC 26.4* 17.1* 15.1* 12.6* 9.4  NEUTROABS 24.0*  --   --   --   --   HGB 12.2 12.5 11.9* 10.9* 10.5*  HCT 39.2 39.0 36.9 33.5* 32.6*  MCV 86.5 86.7 85.2 85.0 83.8  PLT 336 321 311 307 [id number]   Basic Metabolic Panel:  Recent Labs Lab 01/08/16 0412 01/09/16 0606 01/10/16 0838 01/11/16 0751 01/12/16 0536  NA 138 139 143 137 135  K 4.6 3.9 3.3* 3.0* 3.7  CL 102 104 107 102 101  CO2 25 28 27  26 27  GLUCOSE 170* 142* 133* 133* 120*  BUN 53* 29* 29* 28* 23*  CREATININE 1.13* 0.75 0.71 0.74 0.51  CALCIUM 9.0 8.4* 8.8* 8.4* 8.3*  MG  --   --  2.0  --   --    GFR: Estimated Creatinine Clearance: 27.3 mL/min (by C-G formula based on SCr of 0.8 mg/dL). Liver Function Tests:  Recent Labs Lab 01/08/16 0412  AST 21  ALT 15  ALKPHOS 109  BILITOT 0.5  PROT 7.6  ALBUMIN 4.4    Recent Labs Lab 01/08/16 0412  LIPASE 101*   No results for input(s): AMMONIA in the last 168 hours. Coagulation Profile: No results for input(s): INR, PROTIME in the last 168 hours. Cardiac Enzymes: No results for input(s): CKTOTAL, CKMB, CKMBINDEX, TROPONINI in the last 168 hours. BNP (last 3 results) No results for input(s): PROBNP in the last 8760 hours. HbA1C: No results for  input(s): HGBA1C in the last 72 hours. CBG: No results for input(s): GLUCAP in the last 168 hours. Lipid Profile: No results for input(s): CHOL, HDL, LDLCALC, TRIG, CHOLHDL, LDLDIRECT in the last 72 hours. Thyroid Function Tests: No results for input(s): TSH, T4TOTAL, FREET4, T3FREE, THYROIDAB in the last 72 hours. Anemia Panel: No results for input(s): VITAMINB12, FOLATE, FERRITIN, TIBC, IRON, RETICCTPCT in the last 72 hours. Sepsis Labs:  Recent Labs Lab 01/08/16 0502  LATICACIDVEN 2.79*    Recent Results (from the past 240 hour(s))  Urine culture     Status: Abnormal   Collection Time: 01/08/16  4:38 AM  Result Value Ref Range Status   Specimen Description URINE, CLEAN CATCH  Final   Special Requests NONE  Final   Culture MULTIPLE SPECIES PRESENT, SUGGEST RECOLLECTION (A)  Final   Report Status 01/09/2016 FINAL  Final         Radiology Studies: No results found.      Scheduled Meds: . bismuth subsalicylate  30 mL Oral BID  . cefTRIAXone (ROCEPHIN)  IV  2 g Intravenous Q24H  . chlorhexidine  15 mL Mouth/Throat BID  . feeding supplement  1 Container Oral BID BM  . feeding supplement (PRO-STAT SUGAR FREE 64)  30 mL Oral BID  . heparin subcutaneous  5,000 Units Subcutaneous Q8H  . linaclotide  145 mcg Oral QAC breakfast  . lip balm  1 application Topical BID  . metronidazole  500 mg Intravenous Q8H  . mirtazapine  7.5 mg Oral QHS  . multivitamin with minerals  1 tablet Oral Daily  . psyllium  1 packet Oral Daily  . rOPINIRole  8 mg Oral QHS  . saccharomyces boulardii  250 mg Oral BID   Continuous Infusions:     LOS: 6 days    Time spent: > 35 minutes  Velvet Bathe, MD [facility] Pager [phone]  If 7PM-7AM, please contact night-coverage [email or web address] Password [facility] 01/14/2016, 4:50 PM

## 2016-01-14 NOTE — Progress Notes (Signed)
  Subjective: Tolerating diet Denies abdominal pain Having BM's  Objective: Vital signs in last 24 hours: Temp:  [98.3 F (36.8 C)-99.3 F (37.4 C)] 98.8 F (37.1 C) (08/21 0505) Pulse Rate:  [80-101] 87 (08/21 0825) Resp:  [18-20] 18 (08/21 0825) BP: (132-171)/(68-75) 171/75 (08/21 0825) SpO2:  [96 %-99 %] 96 % (08/21 0505) Last BM Date: 01/13/16  Intake/Output from previous day: 08/20 0701 - 08/21 0700 In: 730 [P.O.:280; IV Piggyback:450] Out: -  Intake/Output this shift: Total I/O In: 120 [P.O.:120] Out: -   Exam: Abdomen soft, non tender, non distended  Lab Results:   Recent Labs  01/12/16 0536  WBC 9.4  HGB 10.5*  HCT 32.6*  PLT 250   BMET  Recent Labs  01/12/16 0536  NA 135  K 3.7  CL 101  CO2 27  GLUCOSE 120*  BUN 23*  CREATININE 0.51  CALCIUM 8.3*   PT/INR No results for input(s): LABPROT, INR in the last 72 hours. ABG No results for input(s): PHART, HCO3 in the last 72 hours.  Invalid input(s): PCO2, PO2  Studies/Results: No results found.  Anti-infectives: Anti-infectives    Start     Dose/Rate Route Frequency Ordered Stop   01/10/16 2200  cefTRIAXone (ROCEPHIN) 2 g in dextrose 5 % 50 mL IVPB     2 g 100 mL/hr over 30 Minutes Intravenous Every 24 hours 01/10/16 1042     01/10/16 2200  metroNIDAZOLE (FLAGYL) IVPB 500 mg     500 mg 100 mL/hr over 60 Minutes Intravenous Every 8 hours 01/10/16 1042     01/08/16 2200  meropenem (MERREM) 500 mg in sodium chloride 0.9 % 50 mL IVPB  Status:  Discontinued     500 mg 100 mL/hr over 30 Minutes Intravenous Every 12 hours 01/08/16 0945 01/10/16 1041   01/08/16 0815  meropenem (MERREM) 500 mg in sodium chloride 0.9 % 50 mL IVPB     500 mg 100 mL/hr over 30 Minutes Intravenous  Once 01/08/16 0808 01/08/16 1105      Assessment/Plan:  Resolved SBO  From this point, with her improved and tolerating po, there is really nothing further for surgery to offer.  Will sign off for now.  Call  back if she regresses.  LOS: 6 days    Betha Shadix A 01/14/2016

## 2016-01-15 MED ORDER — CEFDINIR 300 MG PO CAPS: 300.0000 mg | ORAL_CAPSULE | Freq: Two times a day (BID) | ORAL | 0 refills | Status: AC

## 2016-01-15 MED ORDER — SENNA 8.6 MG PO TABS: 1.0000 | ORAL_TABLET | Freq: Every day | ORAL | Status: DC | PRN

## 2016-01-15 MED ORDER — METRONIDAZOLE 500 MG PO TABS: 500.0000 mg | ORAL_TABLET | Freq: Three times a day (TID) | ORAL | 0 refills | Status: AC

## 2016-01-15 NOTE — Progress Notes (Signed)
Patient was stable at discharge. Report given to nurse at [facility].

## 2016-01-15 NOTE — Clinical Social Work Placement (Signed)
   CLINICAL SOCIAL WORK PLACEMENT  NOTE  Date:  01/15/2016  Patient Details  Name: Shelly Martin MRN: [id number] Date of Birth: [birth year]  Clinical Social Work is seeking post-discharge placement for this patient at the [place] level of care (*CSW will initial, date and re-position this form in  chart as items are completed):  No   Patient/family provided with [place] Work Department's list of facilities offering this level of care within the geographic area requested by the patient (or if unable, by the patient's family).  Yes   Patient/family informed of their freedom to choose among providers that offer the needed level of care, that participate in Medicare, Medicaid or managed care program needed by the patient, have an available bed and are willing to accept the patient.      Patient/family informed of [facility]'s ownership interest in [facility] and [facility], as well as of the fact that they are under no obligation to receive care at these facilities.  PASRR submitted to EDS on       PASRR number received on       Existing PASRR number confirmed on 01/15/16     FL2 transmitted to all facilities in geographic area requested by pt/family on       FL2 transmitted to all facilities within larger geographic area on       Patient informed that his/her managed care company has contracts with or will negotiate with certain facilities, including the following:  [facility]         Patient/family informed of bed offers received.  Patient chooses bed at [facility]     Physician recommends and patient chooses bed at      Patient to be transferred to [facility] on 01/15/16.  Patient to be transferred to facility by [facility]     Patient family notified on 01/15/16 of transfer.  Name of family member notified:  Kathrin Greathouse MSW, [place], [place]       Additional  Comment:  Patient is returning to [facility] FAXED, Phoebe Perch UPDATED  _______________________________________________ Lia Hopping, LCSW 01/15/2016, 2:52 PM

## 2016-01-15 NOTE — Discharge Summary (Signed)
Physician Discharge Summary  Shelly Martin [id number] DOB: 13-May-1919 DOA: 01/08/2016  PCP: Kelton Pillar, MD  Admit date: 01/08/2016 Discharge date: 01/15/2016  Time spent: > 35 minutes  Recommendations for Outpatient Follow-up:  1. Monitor blood pressures 2. Avoid opiods for abdominal discomfort   Discharge Diagnoses:  Principal Problem:   SBO (small bowel obstruction) (HCC) Active Problems:   History of TIAs   Hard of hearing   Aortic stenosis   Narcotic addiction ([place])   Pressure ulcer   Discharge Condition: stable  Diet recommendation: Heart healthy  Filed Weights   01/08/16 1900 01/13/16 0456  Weight: 42.5 kg (93 lb 11.1 oz) 41.1 kg (90 lb 9.7 oz)    History of present illness:  Patient is a 82 year old who presented with SBO.   Hospital Course:  Principal Problem:   SBO (small bowel obstruction) ([place]) - resolved patient tolerating oral intake. - General surgery assisted and signed off  Essential hypertension - hold amlodipine on d/c due to low normal blood pressure readings.   Enteritis - will d/c on Third generation cephalosporin and metronidazole for 3 more days can complete a 10 day treatment course  Active Problems:   History of TIAs  - No new focal neurological deficits. Stable    Hard of hearing   Aortic stenosis - Stable    Pressure ulcer - Continue current management  Procedures:  None  Consultations:  Gen. surgery  Discharge Exam: Vitals:   01/14/16 2047 01/15/16 0646  BP: (!) 119/56 (!) 147/74  Pulse: 86 92  Resp: 18 18  Temp: 98 F (36.7 C) 98.2 F (36.8 C)    General: Patient in no acute distress, in no acute distress Cardiovascular: Regular rate and rhythm, no rubs Respiratory: No increased work of breathing no wheezes  Discharge Instructions   Discharge Instructions    Call MD for:  difficulty breathing, headache or visual disturbances    Complete by:  As directed   Call MD for:  persistant  nausea and vomiting    Complete by:  As directed   Call MD for:  temperature >100.4    Complete by:  As directed   Diet - low sodium heart healthy    Complete by:  As directed   Increase activity slowly    Complete by:  As directed     Current Discharge Medication List    START taking these medications   Details  cefdinir (OMNICEF) 300 MG capsule Take 1 capsule (300 mg total) by mouth 2 (two) times daily. Qty: 6 capsule, Refills: 0    metroNIDAZOLE (FLAGYL) 500 MG tablet Take 1 tablet (500 mg total) by mouth 3 (three) times daily. Qty: 9 tablet, Refills: 0      CONTINUE these medications which have NOT CHANGED   Details  Amino Acids-Protein Hydrolys (FEEDING SUPPLEMENT, PRO-STAT SUGAR FREE 64,) LIQD Take 30 mLs by mouth 2 (two) times daily.    feeding supplement (BOOST / RESOURCE BREEZE) LIQD Take 1 Container by mouth 2 (two) times daily between meals.    furosemide (LASIX) 20 MG tablet Take 40 mg by mouth daily.     linaclotide (LINZESS) 145 MCG CAPS capsule Take 145 mcg by mouth daily before breakfast.    magnesium hydroxide (MILK OF MAGNESIA) 400 MG/5ML suspension Take 30 mLs by mouth daily as needed for mild constipation. Reported on 07/27/2015    mirtazapine (REMERON) 15 MG tablet Take 7.5 mg by mouth at bedtime.     morphine (MS CONTIN) 15  MG 12 hr tablet Take 15 mg by mouth 2 (two) times daily.    Multiple Vitamin (MULTIVITAMIN) tablet Take 1 tablet by mouth daily.    ondansetron (ZOFRAN) 4 MG tablet Take 4 mg by mouth every 12 (twelve) hours as needed for nausea or vomiting.    phenol (CHLORASEPTIC) 1.4 % LIQD Use as directed 1 spray in the mouth or throat 3 (three) times daily as needed for throat irritation / pain. If sore throat persist notify the MD    potassium chloride (K-DUR) 10 MEQ tablet Take 20 mEq by mouth daily.     rOPINIRole (REQUIP) 2 MG tablet Take 4 mg by mouth at bedtime.    sennosides-docusate sodium (SENOKOT-S) 8.6-50 MG tablet Take 1 tablet by  mouth 2 (two) times daily. Hold for diarrhea      STOP taking these medications     amLODipine (NORVASC) 5 MG tablet      oxyCODONE-acetaminophen (PERCOCET/ROXICET) 5-325 MG tablet        Allergies  Allergen Reactions  . Amoxicillin Other (See Comments)    Reaction unknown  . Baclofen Other (See Comments)    "fuzzy in the eyes" and dizzy  . Hctz [Hydrochlorothiazide]     nausea  . Valium [Diazepam] Other (See Comments)    Pt became unresponsive and O2 sats dropped.      The results of significant diagnostics from this hospitalization (including imaging, microbiology, ancillary and laboratory) are listed below for reference.    Significant Diagnostic Studies: Ct Abdomen Pelvis W Contrast  Result Date: 01/08/2016 CLINICAL DATA:  Nausea and vomiting since 01/07/2016. Elevated white cell count at 26.4. Elevated lipase at 101. EXAM: CT ABDOMEN AND PELVIS WITH CONTRAST TECHNIQUE: Multidetector CT imaging of the abdomen and pelvis was performed using the standard protocol following bolus administration of intravenous contrast. CONTRAST:  74mL ISOVUE-300 IOPAMIDOL (ISOVUE-300) INJECTION 61% COMPARISON:  04/02/2015 FINDINGS: Atelectasis in the lung bases. Coronary artery calcifications. Small esophageal hiatal hernia. Tiny sub cm low-attenuation lesions in the liver similar to prior study likely representing small cysts. Cholelithiasis with single stone in the gallbladder. No gallbladder wall edema or infiltration. No bile duct dilatation. Pancreas appears somewhat atrophic and there is mild pancreatic ductal dilatation. This is similar to previous study and likely relates to chronic pancreatitis. No definite acute infiltration. The spleen, adrenal glands, kidneys, inferior vena cava, and retroperitoneal lymph nodes are unremarkable. Calcification of the aorta without aneurysm. Distended fluid-filled small bowel loops are present. Terminal ileum is decompressed. Distal jejunal loops demonstrate  fold thickening. There is mesenteric edema and a small amount of ascites. Changes suggest small bowel obstruction. Transition zone is not distinctly localized but appears to be in the central pelvis. Wall thickening may indicate the presence of enteritis as well. Inflammatory cause for obstruction should be considered. No free air. Postoperative changes in the anterior abdominal wall with surgical sutures present. Pelvis: Small amount of free fluid in the pelvis. Uterus is surgically absent. Bladder wall is not thickened. No pelvic mass or lymphadenopathy. Degenerative changes in the spine and hips. No destructive bone lesions. IMPRESSION: Distended fluid-filled small bowel with small bowel fold thickening distally, mesenteric edema, and small amount of ascites. Changes likely represent small bowel obstruction possibly with associated enteritis. Transition zone appears to be in the low pelvis. Cause is not determined but consider inflammatory etiologies. Electronically Signed   By: Lucienne Capers M.D.   On: 01/08/2016 06:51   Dg Abd 2 Views  Result Date: 01/10/2016 CLINICAL  DATA:  Bowel obstruction. EXAM: ABDOMEN - 2 VIEW COMPARISON:  01/09/2016. FINDINGS: Surgical sutures noted over the abdomen. NG tube noted with tip projected over the stomach. NG tube is been slightly advanced. Several air-filled loops of small bowel noted. No free air. Degenerative changes thoracolumbar spine. Terra changes both hips. IMPRESSION: 1. NG tube noted with its tip projected over the upper stomach. G tube is been slightly advanced. 2. Several air-filled loops of minimally prominent small bowel are again noted. No evidence of progressive distention. Colonic gas pattern is normal. No free air. Electronically Signed   By: Marcello Moores  Register   On: 01/10/2016 08:34   Dg Abd 2 Views  Result Date: 01/09/2016 CLINICAL DATA:  Small-bowel obstruction follow-up. EXAM: ABDOMEN - 2 VIEW COMPARISON:  01/09/2016. FINDINGS: NG tube tip noted  in the upper stomach. Side hole left gastroesophageal junction. Advancement of the NG tube approximately 6 -8 cm should be considered. Soft tissue structures are unremarkable. Persistent small bowel distention with interim improvement from prior exam. Colonic gas pattern normal. Stool noted in the colon and rectum. No free air. Degenerative changes lumbar spine and both hips. Surgical sutures are noted over the abdomen. IMPRESSION: 1.  Persistent but improving small bowel distention. 2. NG tube tip noted in the upper portion stomach. Side hole noted at the gastroesophageal junction. Advancement of the NG tube tip approximately 6 -8 cm should be considered. Electronically Signed   By: Marcello Moores  Register   On: 01/09/2016 08:38   Dg Abd Portable 1v  Result Date: 01/09/2016 CLINICAL DATA:  PE 82 y/o  F; small bowel obstruction. EXAM: PORTABLE ABDOMEN - 1 VIEW COMPARISON:  CT of abdomen and pelvis dated 01/08/2016. FINDINGS: No pneumoperitoneum. Persistent dilated loops of small bowel and the mid and lower abdomen. Surgical sutures in material from anterior mesh repair. Bones are diffusely demineralized. Degenerative changes of the right greater than left hip joints and lower lumbar spine. IMPRESSION: Persistent dilated loops of small bowel in the mid and lower abdomen. No pneumoperitoneum. Electronically Signed   By: Kristine Garbe M.D.   On: 01/09/2016 06:28    Microbiology: Recent Results (from the past 240 hour(s))  Urine culture     Status: Abnormal   Collection Time: 01/08/16  4:38 AM  Result Value Ref Range Status   Specimen Description URINE, CLEAN CATCH  Final   Special Requests NONE  Final   Culture MULTIPLE SPECIES PRESENT, SUGGEST RECOLLECTION (A)  Final   Report Status 01/09/2016 FINAL  Final     Labs: Basic Metabolic Panel:  Recent Labs Lab 01/09/16 0606 01/10/16 0838 01/11/16 0751 01/12/16 0536  NA 139 143 137 135  K 3.9 3.3* 3.0* 3.7  CL 104 107 102 101  CO2 28 27 26  27   GLUCOSE 142* 133* 133* 120*  BUN 29* 29* 28* 23*  CREATININE 0.75 0.71 0.74 0.51  CALCIUM 8.4* 8.8* 8.4* 8.3*  MG  --  2.0  --   --    Liver Function Tests: No results for input(s): AST, ALT, ALKPHOS, BILITOT, PROT, ALBUMIN in the last 168 hours. No results for input(s): LIPASE, AMYLASE in the last 168 hours. No results for input(s): AMMONIA in the last 168 hours. CBC:  Recent Labs Lab 01/09/16 0606 01/10/16 0838 01/11/16 0751 01/12/16 0536  WBC 17.1* 15.1* 12.6* 9.4  HGB 12.5 11.9* 10.9* 10.5*  HCT 39.0 36.9 33.5* 32.6*  MCV 86.7 85.2 85.0 83.8  PLT 321 311 307 250   Cardiac Enzymes: No  results for input(s): CKTOTAL, CKMB, CKMBINDEX, TROPONINI in the last 168 hours. BNP: BNP (last 3 results) No results for input(s): BNP in the last 8760 hours.  ProBNP (last 3 results) No results for input(s): PROBNP in the last 8760 hours.  CBG: No results for input(s): GLUCAP in the last 168 hours.  Signed:  Velvet Bathe MD.  [facility] 01/15/2016, 2:17 PM

## 2016-01-15 NOTE — Progress Notes (Signed)
LCSWA spoke with AC-Nikki at [facility] updated FL2 and therapy notes. Patient daughter aware of discharge, reports she will visit patient around 8:00am tonight.  Patient agreeable to return. [facility] called for Transport.

## 2016-01-16 ENCOUNTER — Encounter: Payer: Self-pay | Admitting: Internal Medicine

## 2016-01-16 ENCOUNTER — Non-Acute Institutional Stay (SKILLED_NURSING_FACILITY): Payer: Medicare Other | Admitting: Internal Medicine

## 2016-01-16 DIAGNOSIS — G2581 Restless legs syndrome: Secondary | ICD-10-CM

## 2016-01-16 DIAGNOSIS — I10 Essential (primary) hypertension: Secondary | ICD-10-CM | POA: Diagnosis not present

## 2016-01-16 DIAGNOSIS — I350 Nonrheumatic aortic (valve) stenosis: Secondary | ICD-10-CM | POA: Diagnosis not present

## 2016-01-16 DIAGNOSIS — E876 Hypokalemia: Secondary | ICD-10-CM | POA: Diagnosis not present

## 2016-01-16 DIAGNOSIS — M25551 Pain in right hip: Secondary | ICD-10-CM | POA: Diagnosis not present

## 2016-01-16 DIAGNOSIS — L89154 Pressure ulcer of sacral region, stage 4: Secondary | ICD-10-CM

## 2016-01-16 DIAGNOSIS — G8929 Other chronic pain: Secondary | ICD-10-CM | POA: Diagnosis not present

## 2016-01-16 DIAGNOSIS — K5669 Other intestinal obstruction: Secondary | ICD-10-CM | POA: Diagnosis not present

## 2016-01-16 DIAGNOSIS — K529 Noninfective gastroenteritis and colitis, unspecified: Secondary | ICD-10-CM

## 2016-01-16 DIAGNOSIS — K56609 Unspecified intestinal obstruction, unspecified as to partial versus complete obstruction: Secondary | ICD-10-CM

## 2016-01-16 DIAGNOSIS — L98429 Non-pressure chronic ulcer of back with unspecified severity: Secondary | ICD-10-CM

## 2016-01-16 NOTE — Progress Notes (Addendum)
MRN: [id number] Name: Shelly Martin  Sex: female Age: 82 y.o. DOB: [birth year]  [place] #:  Facility/Room: [facility] / 303 D Level Of Care: SNF Provider: [name], MD Emergency Contacts: Extended Emergency Contact Information Primary Emergency Contact: [facility] Address: [place], [place] [address] [name] of [place] Phone: [phone] Relation: Daughter Secondary Emergency Contact: [place], [place] Phone: [phone] Relation: Son  Code Status: DNR  Allergies: Amoxicillin; Baclofen; Hctz [hydrochlorothiazide]; and Valium [diazepam]  Chief Complaint  Patient presents with  . New Admit To SNF    Admit to Facility    HPI: Patient is 82 y.o. female with chronic back pain, hypertension, TIA, mild memory loss, otherwise healthy for her age with on and off nausea for the last few days but then developed some lower quadrant abdominal pain which wass diffuse in the lower sections nonradiating, constant, associated with worsening nausea, no bowel movements or passing of flatus in the last 12 hours, who  presented to the ER . Pt was admitted to [facility] from 8/15-22 where she was dx with ileitis with SBO by CT. She was tx with IV antibiotics and NG tube with resolution then switched to po meds. Pt is admitted to SNF for residential care and for OT/PT. While at SNF pt will be followed for HTN, pt was taken off norvasc but BP today is elevated so she will go back on it, chronic pain , ytx with MS contin 15 mg BID and RLS, tx with reqiuip.  Past Medical History:  Diagnosis Date  . Acute encephalopathy   . AKI (acute kidney injury) ([place])   . Altered mental state   . Anemia   . Aortic stenosis   . Arthritis    Back, knees  . Bowel incontinence   . Colon polyps   . Constipation   . Gastric ulcer    years ago  . Hypertension   . Hypokalemia   . Restless legs syndrome    on Requip  .  Situational stress   . Spinal stenosis   . Stroke ([place])    Mini, no residual  . TIA (transient ischemic attack)   . Ulcer causing bleeding and hole in wall of stomach or small intestine   . Urinary bladder calculus   . Urinary incontinence   . Vertigo     Past Surgical History:  Procedure Laterality Date  . COLON RESECTION  50 years ago  . LUMBAR LAMINECTOMY/DECOMPRESSION MICRODISCECTOMY  06/25/2011   Procedure: LUMBAR LAMINECTOMY/DECOMPRESSION MICRODISCECTOMY;  Surgeon: Hosie Spangle, MD;  Location: [facility];  Service: Neurosurgery;  Laterality: Right;  RIGHT Lumbar Two-Three hemilaminectomy and microdiskectomy  . TONSILLECTOMY    . UTERINE FIBROID SURGERY    . VAGINAL HYSTERECTOMY        Medication List       Accurate as of 01/16/16 10:09 AM. Always use your most recent med list.          cefdinir 300 MG capsule Commonly known as:  OMNICEF Take 1 capsule (300 mg total) by mouth 2 (two) times daily.   feeding supplement (PRO-STAT SUGAR FREE 64) Liqd Take 30 mLs by mouth 2 (two) times daily.   feeding supplement Liqd Take 1 Container by mouth 2 (two) times daily between meals.   furosemide 20 MG tablet Commonly known as:  LASIX Take  40 mg by mouth daily.   LINZESS 145 MCG Caps capsule Generic drug:  linaclotide Take 145 mcg by mouth daily before breakfast.   magnesium hydroxide 400 MG/5ML suspension Commonly known as:  MILK OF MAGNESIA Take 30 mLs by mouth daily as needed for mild constipation. Reported on 07/27/2015   metroNIDAZOLE 500 MG tablet Commonly known as:  FLAGYL Take 1 tablet (500 mg total) by mouth 3 (three) times daily.   mirtazapine 15 MG tablet Commonly known as:  REMERON Take 7.5 mg by mouth at bedtime.   morphine 15 MG 12 hr tablet Commonly known as:  MS CONTIN Take 15 mg by mouth 2 (two) times daily.   multivitamin tablet Take 1 tablet by mouth daily.   ondansetron 4 MG tablet Commonly known as:  ZOFRAN Take 4 mg by mouth  every 12 (twelve) hours as needed for nausea or vomiting.   phenol 1.4 % Liqd Commonly known as:  CHLORASEPTIC Use as directed 1 spray in the mouth or throat 3 (three) times daily as needed for throat irritation / pain. If sore throat persist notify the MD   potassium chloride 10 MEQ tablet Commonly known as:  K-DUR Take 20 mEq by mouth daily.   rOPINIRole 2 MG tablet Commonly known as:  REQUIP Take 4 mg by mouth at bedtime.   sennosides-docusate sodium 8.6-50 MG tablet Commonly known as:  SENOKOT-S Take 1 tablet by mouth 2 (two) times daily. Hold for diarrhea       No orders of the defined types were placed in this encounter.   Immunization History  Administered Date(s) Administered  . Influenza Split 04/15/2012  . Influenza,inj,Quad PF,36+ Mos 03/06/2014  . PPD Test 08/30/2014, 09/18/2014  . Pneumococcal Polysaccharide-23 08/29/2013    Social History  Substance Use Topics  . Smoking status: Never Smoker  . Smokeless tobacco: Never Used  . Alcohol use 0.0 oz/week     Comment: rarely    Family history is   Family History  Problem Relation Age of Onset  . Dementia Mother   . Stroke Father   . Pancreatic cancer Brother   . Anesthesia problems Neg Hx   . Colon cancer Neg Hx   . Esophageal cancer Neg Hx   . Stomach cancer Neg Hx   . Diabetes Neg Hx   . Kidney disease Neg Hx   . Liver disease Neg Hx       Review of Systems  DATA OBTAINED: from patient GENERAL:  no fevers, fatigue, appetite changes- pt says in past she hasn't eaten everything and if she didn't like a meal didn't eat it but now she realizes she needs to gain weight so se will eat her meals plus her supplements SKIN: No itching, rash or wounds EYES: No eye pain, redness, discharge EARS: No earache, tinnitus, change in hearing NOSE: No congestion, drainage or bleeding  MOUTH/THROAT: No mouth or tooth pain, No sore throat RESPIRATORY: No cough, wheezing, SOB CARDIAC: No chest pain,  palpitations, lower extremity edema  GI: No abdominal pain, No N/V/D or constipation, No heartburn or reflux  GU: No dysuria, frequency or urgency, or incontinence  MUSCULOSKELETAL: No unrelieved bone/joint pain NEUROLOGIC: No headache, dizziness or focal weakness PSYCHIATRIC: No c/o anxiety or sadness   Vitals:   01/16/16 0930  BP: (!) 152/87  Pulse: 95  Resp: 20  Temp: 97.4 F (36.3 C)    SpO2 Readings from Last 1 Encounters:  01/15/16 97%  Physical Exam  GENERAL APPEARANCE: Alert, conversant,  No acute distress.  SKIN: No diaphoresis rash HEAD: Normocephalic, atraumatic  EYES: Conjunctiva/lids clear. Pupils round, reactive. EOMs intact.  EARS: External exam WNL, canals clear. Hearing grossly normal.  NOSE: No deformity or discharge.  MOUTH/THROAT: Lips w/o lesions  RESPIRATORY: Breathing is even, unlabored. Lung sounds are clear   CARDIOVASCULAR: Heart RRR 1/6 murmur, no  rubs or gallops. No peripheral edema.   GASTROINTESTINAL: Abdomen is soft, non-tender, not distended w/ normal bowel sounds. GENITOURINARY: Bladder non tender, not distended  MUSCULOSKELETAL: No abnormal joints or musculature NEUROLOGIC:  Cranial nerves 2-12 grossly intact. Moves all extremities  PSYCHIATRIC: Mood and affect appropriate to situation, no behavioral issues  Patient Active Problem List   Diagnosis Date Noted  . Pressure ulcer 01/09/2016  . SBO (small bowel obstruction) ([place]) 01/08/2016  . Narcotic addiction ([place]) 05/15/2015  . Diarrhea 03/30/2015  . Acute pharyngitis 02/26/2015  . Osteoarthritis of right knee 02/22/2015  . Pain in joint, lower leg 10/31/2014  . Ulcer of sacral region, stage 4 ([place]) 04/15/2014  . Moderate aortic stenosis 04/14/2014  . Pre-syncope 04/14/2014  . Hypotension 04/14/2014  . Acute on chronic renal failure ([place]) 04/14/2014  . Anemia 04/14/2014  . Protein-calorie malnutrition, severe ([place]) 04/14/2014  . Altered mental status   . Dehydration  04/13/2014  . Altered mental state 04/13/2014  . Acute kidney injury ([place])   . Hypokalemia 03/25/2014  . Syncope 03/16/2014  . Acute encephalopathy 03/16/2014  . Leg swelling 03/02/2014  . Avascular necrosis of bone of right hip ([place]) 09/27/2013  . Primary osteoarthritis of right hip 09/07/2013  . Spinal stenosis, lumbar region, with neurogenic claudication 09/07/2013  . Closed fracture of pubic ramus ([place]) 09/07/2013  . BPPV (benign paroxysmal positional vertigo) 09/07/2013  . Pharyngeal swelling 08/30/2013  . Pelvic fracture ([place]) 08/28/2013  . Fall 08/26/2013  . Dizziness 08/26/2013  . Abnormal stress test 08/26/2013  . Preop cardiovascular exam 08/01/2013  . Aortic stenosis 08/01/2013  . Edema 06/30/2013  . Loss of weight 01/04/2013  . Situational stress 01/04/2013  . Chronic right hip pain 07/05/2012  . Knee pain, acute 06/29/2012  . Vertigo 06/24/2012  . Acute exacerbation of chronic low back pain 06/24/2012  . Lumbar pain with radiation down right leg 06/09/2012  . Osteoarthritis of hip 04/29/2012  . History of TIAs 04/17/2012  . S/P total hysterectomy and bilateral salpingo-oophorectomy 04/17/2012  . Hard of hearing 04/17/2012  . History of anemia 04/17/2012  . RLS (restless legs syndrome) 04/17/2012  . Chronic back pain 04/17/2012  . Spinal stenosis, lumbar 04/17/2012  . Lower back pain 12/20/2011  . Personal history of colonic polyps 10/08/2011  . H/O: CVA (cardiovascular accident) 09/14/2011  . Essential hypertension 09/14/2011  . Restless legs syndrome 09/14/2011  . Constipation 08/20/2011  . Urinary incontinence 08/20/2011       Component Value Date/Time   WBC 9.4 01/12/2016 0536   RBC 3.89 01/12/2016 0536   HGB 10.5 (L) 01/12/2016 0536   HCT 32.6 (L) 01/12/2016 0536   PLT 250 01/12/2016 0536   MCV 83.8 01/12/2016 0536   LYMPHSABS 1.3 01/08/2016 0412   MONOABS 1.1 (H) 01/08/2016 0412   EOSABS 0.0 01/08/2016 0412   BASOSABS 0.0 01/08/2016 0412         Component Value Date/Time   NA 135 01/12/2016 0536   NA 135 (A) 01/12/2016   K 3.7 01/12/2016 0536   CL 101 01/12/2016 0536   CO2 27 01/12/2016 0536  GLUCOSE 120 (H) 01/12/2016 0536   BUN 23 (H) 01/12/2016 0536   BUN 23 (A) 01/12/2016   CREATININE 0.51 01/12/2016 0536   CREATININE 0.78 06/28/2014 1205   CALCIUM 8.3 (L) 01/12/2016 0536   PROT 7.6 01/08/2016 0412   ALBUMIN 4.4 01/08/2016 0412   AST 21 01/08/2016 0412   ALT 15 01/08/2016 0412   ALKPHOS 109 01/08/2016 0412   BILITOT 0.5 01/08/2016 0412   GFRNONAA >60 01/12/2016 0536   GFRAA >60 01/12/2016 0536    Lab Results  Component Value Date   HGBA1C 5.6 06/28/2014    Lab Results  Component Value Date   CHOL 172 03/06/2014   HDL 61 03/06/2014   LDLCALC 88 03/06/2014   TRIG 114 03/06/2014   CHOLHDL 2.8 03/06/2014     Ct Abdomen Pelvis W Contrast  Result Date: 01/08/2016 CLINICAL DATA:  Nausea and vomiting since 01/07/2016. Elevated white cell count at 26.4. Elevated lipase at 101. EXAM: CT ABDOMEN AND PELVIS WITH CONTRAST TECHNIQUE: Multidetector CT imaging of the abdomen and pelvis was performed using the standard protocol following bolus administration of intravenous contrast. CONTRAST:  59mL ISOVUE-300 IOPAMIDOL (ISOVUE-300) INJECTION 61% COMPARISON:  04/02/2015 FINDINGS: Atelectasis in the lung bases. Coronary artery calcifications. Small esophageal hiatal hernia. Tiny sub cm low-attenuation lesions in the liver similar to prior study likely representing small cysts. Cholelithiasis with single stone in the gallbladder. No gallbladder wall edema or infiltration. No bile duct dilatation. Pancreas appears somewhat atrophic and there is mild pancreatic ductal dilatation. This is similar to previous study and likely relates to chronic pancreatitis. No definite acute infiltration. The spleen, adrenal glands, kidneys, inferior vena cava, and retroperitoneal lymph nodes are unremarkable. Calcification of the aorta  without aneurysm. Distended fluid-filled small bowel loops are present. Terminal ileum is decompressed. Distal jejunal loops demonstrate fold thickening. There is mesenteric edema and a small amount of ascites. Changes suggest small bowel obstruction. Transition zone is not distinctly localized but appears to be in the central pelvis. Wall thickening may indicate the presence of enteritis as well. Inflammatory cause for obstruction should be considered. No free air. Postoperative changes in the anterior abdominal wall with surgical sutures present. Pelvis: Small amount of free fluid in the pelvis. Uterus is surgically absent. Bladder wall is not thickened. No pelvic mass or lymphadenopathy. Degenerative changes in the spine and hips. No destructive bone lesions. IMPRESSION: Distended fluid-filled small bowel with small bowel fold thickening distally, mesenteric edema, and small amount of ascites. Changes likely represent small bowel obstruction possibly with associated enteritis. Transition zone appears to be in the low pelvis. Cause is not determined but consider inflammatory etiologies. Electronically Signed   By: Lucienne Capers M.D.   On: 01/08/2016 06:51   Dg Abd 2 Views  Result Date: 01/09/2016 CLINICAL DATA:  Small-bowel obstruction follow-up. EXAM: ABDOMEN - 2 VIEW COMPARISON:  01/09/2016. FINDINGS: NG tube tip noted in the upper stomach. Side hole left gastroesophageal junction. Advancement of the NG tube approximately 6 -8 cm should be considered. Soft tissue structures are unremarkable. Persistent small bowel distention with interim improvement from prior exam. Colonic gas pattern normal. Stool noted in the colon and rectum. No free air. Degenerative changes lumbar spine and both hips. Surgical sutures are noted over the abdomen. IMPRESSION: 1.  Persistent but improving small bowel distention. 2. NG tube tip noted in the upper portion stomach. Side hole noted at the gastroesophageal junction.  Advancement of the NG tube tip approximately 6 -8 cm should be considered.  Electronically Signed   By: Marcello Moores  Register   On: 01/09/2016 08:38   Dg Abd Portable 1v  Result Date: 01/09/2016 CLINICAL DATA:  PE 82 y/o  F; small bowel obstruction. EXAM: PORTABLE ABDOMEN - 1 VIEW COMPARISON:  CT of abdomen and pelvis dated 01/08/2016. FINDINGS: No pneumoperitoneum. Persistent dilated loops of small bowel and the mid and lower abdomen. Surgical sutures in material from anterior mesh repair. Bones are diffusely demineralized. Degenerative changes of the right greater than left hip joints and lower lumbar spine. IMPRESSION: Persistent dilated loops of small bowel in the mid and lower abdomen. No pneumoperitoneum. Electronically Signed   By: Kristine Garbe M.D.   On: 01/09/2016 06:28    Not all labs, radiology exams or other studies done during hospitalization come through on my EPIC note; however they are reviewed by me.    Assessment and Plan  ILEITIS/SBO- treated per surgical consult with IVF, NG tube and IV antibiotics, meropenum, then switched to flagyl and rocephin 8/17 with improvement SNF - pt was d/c on omniceff 300 mg BID and flagyl 300 mg TID for 3 more days to complete a 10 day course and will start back with PT since she has had a condition chang  HTN SNF - pt was taken off her norvasc but today her SPB is 152, therefore will restart norvasc 5 mg daily  AORTIC STENOSIS -reported stable SNF - will continue with lasix 40 mg with f/u BMP  HYPOKALEMIA SNF - 2/2 lasix ; will cont replacement and f/u BMP  SACRAL pressure ulcer LONG TERM SNF- healing very slowly, but healing, is followed by wound care nurse  RLS SNf - improved with requip; cont requip  CHRONIC PAIN -  SNF- Pt is followed at the pain clinic but her percocet was stopped in the hospital and pt is now only on MS COntin 15 mg q 12; she did not say anything to be about pain issues    Time spent > 45 min;> 50% of  time with patient was spent reviewing records, labs, tests and studies, counseling and developing plan of care  Webb Silversmith D. [name], MD

## 2016-01-19 ENCOUNTER — Encounter: Payer: Self-pay | Admitting: Internal Medicine

## 2016-01-19 DIAGNOSIS — K529 Noninfective gastroenteritis and colitis, unspecified: Secondary | ICD-10-CM | POA: Insufficient documentation

## 2016-01-31 ENCOUNTER — Encounter: Payer: Self-pay | Admitting: Internal Medicine

## 2016-01-31 ENCOUNTER — Non-Acute Institutional Stay (SKILLED_NURSING_FACILITY): Payer: Medicare Other | Admitting: Internal Medicine

## 2016-01-31 DIAGNOSIS — R600 Localized edema: Secondary | ICD-10-CM | POA: Diagnosis not present

## 2016-01-31 NOTE — Progress Notes (Signed)
MRN: [id number] Name: Shelly Martin  Sex: female Age: 82 y.o. DOB: [birth year]  [place] #:  Facility/Room: [facility] / [address] Level Of Care: SNF Provider: [name], MD Emergency Contacts: Extended Emergency Contact Information Primary Emergency Contact: [facility] Address: [place], [place] [address] [name] of [place] Phone: [phone] Relation: Daughter Secondary Emergency Contact: [place], [place] of [place] Phone: [phone] Relation: Son  Code Status: DNR  Allergies: Amoxicillin; Baclofen; Hctz [hydrochlorothiazide]; and Valium [diazepam]  Chief Complaint  Patient presents with  . Acute Visit    Acute    HPI: Patient is 82 y.o. female who IS BEING SEEN FOR ble EDEMA.pt says this is a new thing for her, onset sfer d/c from hospital about a month ago. Pt denies SOB. Pt admits that legs have ben up all day except for the 2 hours around lunch but elevation is not making much difference.Pt is already wearing TED hose.  Past Medical History:  Diagnosis Date  . Acute encephalopathy   . AKI (acute kidney injury) ([place])   . Altered mental state   . Anemia   . Aortic stenosis   . Arthritis    Back, knees  . Bowel incontinence   . Colon polyps   . Constipation   . Gastric ulcer    years ago  . Hypertension   . Hypokalemia   . Restless legs syndrome    on Requip  . Situational stress   . Spinal stenosis   . Stroke ([place])    Mini, no residual  . TIA (transient ischemic attack)   . Ulcer causing bleeding and hole in wall of stomach or small intestine   . Urinary bladder calculus   . Urinary incontinence   . Vertigo     Past Surgical History:  Procedure Laterality Date  . COLON RESECTION  50 years ago  . LUMBAR LAMINECTOMY/DECOMPRESSION MICRODISCECTOMY  06/25/2011   Procedure: LUMBAR LAMINECTOMY/DECOMPRESSION MICRODISCECTOMY;  Surgeon: Hosie Spangle, MD;  Location: [facility];  Service: Neurosurgery;  Laterality: Right;  RIGHT Lumbar Two-Three hemilaminectomy and microdiskectomy  . TONSILLECTOMY    . UTERINE FIBROID SURGERY    . VAGINAL HYSTERECTOMY        Medication List       Accurate as of 01/31/16 11:59 PM. Always use your most recent med list.          amLODipine 5 MG tablet Commonly known as:  NORVASC Take 5 mg by mouth daily.   cefdinir 300 MG capsule Commonly known as:  OMNICEF Take 300 mg by mouth 2 (two) times daily.   feeding supplement (PRO-STAT SUGAR FREE 64) Liqd Take 30 mLs by mouth 2 (two) times daily.   feeding supplement Liqd Take 1 Container by mouth 2 (two) times daily between meals.   furosemide 20 MG tablet Commonly known as:  LASIX Take 40 mg by mouth daily.   LINZESS 145 MCG Caps capsule Generic drug:  linaclotide Take 145 mcg by mouth daily before breakfast.   magnesium hydroxide 400 MG/5ML suspension Commonly known as:  MILK OF MAGNESIA Take 30 mLs by mouth daily as needed for mild constipation. Reported on 07/27/2015   metroNIDAZOLE 500 MG tablet Commonly known as:  FLAGYL Take 500 mg by mouth 3 (three) times daily.   mirtazapine 15 MG tablet Commonly known as:  REMERON Take  7.5 mg by mouth at bedtime.   morphine 15 MG 12 hr tablet Commonly known as:  MS CONTIN Take 15 mg by mouth 2 (two) times daily.   multivitamin tablet Take 1 tablet by mouth daily.   ondansetron 4 MG tablet Commonly known as:  ZOFRAN Take 4 mg by mouth every 12 (twelve) hours as needed for nausea or vomiting.   phenol 1.4 % Liqd Commonly known as:  CHLORASEPTIC Use as directed 1 spray in the mouth or throat 3 (three) times daily as needed for throat irritation / pain. If sore throat persist notify the MD   potassium chloride 10 MEQ tablet Commonly known as:  K-DUR Take 20 mEq by mouth daily.   rOPINIRole 2 MG tablet Commonly known as:  REQUIP Take 4 mg by mouth at bedtime.   sennosides-docusate sodium 8.6-50 MG  tablet Commonly known as:  SENOKOT-S Take 1 tablet by mouth 2 (two) times daily. Hold for diarrhea       No orders of the defined types were placed in this encounter.   Immunization History  Administered Date(s) Administered  . Influenza Split 04/15/2012  . Influenza,inj,Quad PF,36+ Mos 03/06/2014  . PPD Test 08/30/2014, 09/18/2014  . Pneumococcal Polysaccharide-23 08/29/2013    Social History  Substance Use Topics  . Smoking status: Never Smoker  . Smokeless tobacco: Never Used  . Alcohol use 0.0 oz/week     Comment: rarely    Review of Systems  DATA OBTAINED: from patient, nurse GENERAL:  no fevers, fatigue, appetite- pt has been eating well and taking supplements to increase her weight SKIN: No itching, rash HEENT: No complaint RESPIRATORY: No cough, wheezing, SOB CARDIAC: No chest pain, palpitations, +lower extremity edema  GI: No abdominal pain, No N/V/D or constipation, No heartburn or reflux  GU: No dysuria, frequency or urgency, or incontinence  MUSCULOSKELETAL: No unrelieved bone/joint pain NEUROLOGIC: No headache, dizziness  PSYCHIATRIC: No overt anxiety or sadness  Vitals:   01/31/16 1359  BP: (!) 141/61  Pulse: 98  Resp: 18  Temp: 97.4 F (36.3 C)    Physical Exam  GENERAL APPEARANCE: Alert, conversant, No acute distress  SKIN: No diaphoresis rash HEENT: Unremarkable RESPIRATORY: Breathing is even, unlabored. Lung sounds are clear   CARDIOVASCULAR: Heart RRR no murmurs, rubs or gallops. 2+peripheral edema  GASTROINTESTINAL: Abdomen is soft, non-tender, not distended w/ normal bowel sounds.  GENITOURINARY: Bladder non tender, not distended  MUSCULOSKELETAL: No abnormal joints or musculature NEUROLOGIC: Cranial nerves 2-12 grossly intact. Moves all extremities PSYCHIATRIC: Mood and affect appropriate to situation, no behavioral issues  Patient Active Problem List   Diagnosis Date Noted  . Ileitis 01/19/2016  . Pressure ulcer 01/09/2016  .  SBO (small bowel obstruction) ([place]) 01/08/2016  . Narcotic addiction ([place]) 05/15/2015  . Diarrhea 03/30/2015  . Acute pharyngitis 02/26/2015  . Osteoarthritis of right knee 02/22/2015  . Pain in joint, lower leg 10/31/2014  . Ulcer of sacral region, stage 4 ([place]) 04/15/2014  . Moderate aortic stenosis 04/14/2014  . Pre-syncope 04/14/2014  . Hypotension 04/14/2014  . Acute on chronic renal failure ([place]) 04/14/2014  . Anemia 04/14/2014  . Protein-calorie malnutrition, severe ([place]) 04/14/2014  . Altered mental status   . Dehydration 04/13/2014  . Altered mental state 04/13/2014  . Acute kidney injury ([place])   . Hypokalemia 03/25/2014  . Syncope 03/16/2014  . Acute encephalopathy 03/16/2014  . Leg swelling 03/02/2014  . Avascular necrosis of bone of right hip ([place]) 09/27/2013  . Primary osteoarthritis  of right hip 09/07/2013  . Spinal stenosis, lumbar region, with neurogenic claudication 09/07/2013  . Closed fracture of pubic ramus ([place]) 09/07/2013  . BPPV (benign paroxysmal positional vertigo) 09/07/2013  . Pharyngeal swelling 08/30/2013  . Pelvic fracture ([place]) 08/28/2013  . Fall 08/26/2013  . Dizziness 08/26/2013  . Abnormal stress test 08/26/2013  . Preop cardiovascular exam 08/01/2013  . Aortic stenosis 08/01/2013  . Edema 06/30/2013  . Loss of weight 01/04/2013  . Situational stress 01/04/2013  . Chronic right hip pain 07/05/2012  . Knee pain, acute 06/29/2012  . Vertigo 06/24/2012  . Acute exacerbation of chronic low back pain 06/24/2012  . Lumbar pain with radiation down right leg 06/09/2012  . Osteoarthritis of hip 04/29/2012  . History of TIAs 04/17/2012  . S/P total hysterectomy and bilateral salpingo-oophorectomy 04/17/2012  . Hard of hearing 04/17/2012  . History of anemia 04/17/2012  . RLS (restless legs syndrome) 04/17/2012  . Chronic back pain 04/17/2012  . Spinal stenosis, lumbar 04/17/2012  . Lower back pain 12/20/2011  . Personal history of colonic  polyps 10/08/2011  . H/O: CVA (cardiovascular accident) 09/14/2011  . Essential hypertension 09/14/2011  . Restless legs syndrome 09/14/2011  . Constipation 08/20/2011  . Urinary incontinence 08/20/2011    CBC    Component Value Date/Time   WBC 9.4 01/12/2016 0536   RBC 3.89 01/12/2016 0536   HGB 10.5 (L) 01/12/2016 0536   HCT 32.6 (L) 01/12/2016 0536   PLT 250 01/12/2016 0536   MCV 83.8 01/12/2016 0536   LYMPHSABS 1.3 01/08/2016 0412   MONOABS 1.1 (H) 01/08/2016 0412   EOSABS 0.0 01/08/2016 0412   BASOSABS 0.0 01/08/2016 0412    CMP     Component Value Date/Time   NA 135 01/12/2016 0536   NA 135 (A) 01/12/2016   K 3.7 01/12/2016 0536   CL 101 01/12/2016 0536   CO2 27 01/12/2016 0536   GLUCOSE 120 (H) 01/12/2016 0536   BUN 23 (H) 01/12/2016 0536   BUN 23 (A) 01/12/2016   CREATININE 0.51 01/12/2016 0536   CREATININE 0.78 06/28/2014 1205   CALCIUM 8.3 (L) 01/12/2016 0536   PROT 7.6 01/08/2016 0412   ALBUMIN 4.4 01/08/2016 0412   AST 21 01/08/2016 0412   ALT 15 01/08/2016 0412   ALKPHOS 109 01/08/2016 0412   BILITOT 0.5 01/08/2016 0412   GFRNONAA >60 01/12/2016 0536   GFRAA >60 01/12/2016 0536    Assessment and Plan  BLE edema - have increased lasix to 60 mg daily and increased K+ to 30 meq daily and cont TED hose   Later entry- Kasie, wound care nurse says that LLE wound is weeping ; skin is very delicate; we decided to wrap her legs with coban   Time spent > 25 min;> 50% of time with patient was spent reviewing records, labs, tests and studies, counseling and developing plan of care  Webb Silversmith D. [name], MD

## 2016-02-02 ENCOUNTER — Encounter: Payer: Self-pay | Admitting: Internal Medicine

## 2016-02-02 DIAGNOSIS — R600 Localized edema: Secondary | ICD-10-CM | POA: Insufficient documentation

## 2016-02-04 LAB — BASIC METABOLIC PANEL
BUN: 34 mg/dL — AB (ref 4–21)
Creatinine: 1 mg/dL (ref 0.5–1.1)
GLUCOSE: 223 mg/dL
Potassium: 4.8 mmol/L (ref 3.4–5.3)
Sodium: 138 mmol/L (ref 137–147)

## 2016-02-05 ENCOUNTER — Encounter: Payer: Self-pay | Admitting: Internal Medicine

## 2016-02-05 ENCOUNTER — Non-Acute Institutional Stay (SKILLED_NURSING_FACILITY): Payer: Medicare Other | Admitting: Internal Medicine

## 2016-02-05 DIAGNOSIS — M25511 Pain in right shoulder: Secondary | ICD-10-CM

## 2016-02-05 DIAGNOSIS — G8929 Other chronic pain: Secondary | ICD-10-CM | POA: Diagnosis not present

## 2016-02-05 DIAGNOSIS — L89154 Pressure ulcer of sacral region, stage 4: Secondary | ICD-10-CM

## 2016-02-05 DIAGNOSIS — M549 Dorsalgia, unspecified: Secondary | ICD-10-CM | POA: Diagnosis not present

## 2016-02-05 DIAGNOSIS — M25551 Pain in right hip: Secondary | ICD-10-CM

## 2016-02-05 DIAGNOSIS — G2581 Restless legs syndrome: Secondary | ICD-10-CM

## 2016-02-05 DIAGNOSIS — L98429 Non-pressure chronic ulcer of back with unspecified severity: Secondary | ICD-10-CM

## 2016-02-05 NOTE — Progress Notes (Addendum)
Location:  [facility] Room Number: [place] of Service:  SNF ([id number])  [name], MD  Patient Care Team: Lanice Shirts, MD as PCP - General (Internal Medicine) [name], MD as Consulting Physician (Orthopedic Surgery) [name], MD as Consulting Physician (Otolaryngology)  Extended Emergency Contact Information Primary Emergency Contact: [name] Address: [place], [place] [address] [name] of [place] Phone: [phone] Relation: Daughter Secondary Emergency Contact: [place], [place] of [place] Phone: [phone] Relation: Son   Chief Complaint  Patient presents with  . Medical Management of Chronic Issues    Routine Visit    HPI:  Pt is a 82 y.o. female seen today for routine issues of chronic pain, stage 4 ulcer of sarum and RLS.   Past Medical History:  Diagnosis Date  . Acute encephalopathy   . AKI (acute kidney injury) ([place])   . Altered mental state   . Anemia   . Aortic stenosis   . Arthritis    Back, knees  . Bowel incontinence   . Colon polyps   . Constipation   . Gastric ulcer    years ago  . Hypertension   . Hypokalemia   . Restless legs syndrome    on Requip  . Situational stress   . Spinal stenosis   . Stroke ([place])    Mini, no residual  . TIA (transient ischemic attack)   . Ulcer causing bleeding and hole in wall of stomach or small intestine   . Urinary bladder calculus   . Urinary incontinence   . Vertigo    Past Surgical History:  Procedure Laterality Date  . COLON RESECTION  50 years ago  . LUMBAR LAMINECTOMY/DECOMPRESSION MICRODISCECTOMY  06/25/2011   Procedure: LUMBAR LAMINECTOMY/DECOMPRESSION MICRODISCECTOMY;  Surgeon: Hosie Spangle, MD;  Location: [facility];  Service: Neurosurgery;  Laterality: Right;  RIGHT Lumbar Two-Three hemilaminectomy and microdiskectomy  . TONSILLECTOMY    .  UTERINE FIBROID SURGERY    . VAGINAL HYSTERECTOMY      Allergies  Allergen Reactions  . Amoxicillin Other (See Comments)    Reaction unknown  . Baclofen Other (See Comments)    "fuzzy in the eyes" and dizzy  . Hctz [Hydrochlorothiazide]     nausea  . Valium [Diazepam] Other (See Comments)    Pt became unresponsive and O2 sats dropped.      Medication List       Accurate as of 02/05/16 10:16 AM. Always use your most recent med list.          amLODipine 5 MG tablet Commonly known as:  NORVASC Take 5 mg by mouth daily.   cefdinir 300 MG capsule Commonly known as:  OMNICEF Take 300 mg by mouth 2 (two) times daily.   feeding supplement (PRO-STAT SUGAR FREE 64) Liqd Take 30 mLs by mouth 2 (two) times daily.   feeding supplement Liqd Take 1 Container by mouth 2 (two) times daily between meals.   furosemide 20 MG tablet Commonly known as:  LASIX Take 60 mg by mouth daily.   LINZESS 145 MCG Caps capsule Generic drug:  linaclotide Take 145 mcg by mouth daily before breakfast.   magnesium hydroxide 400 MG/5ML suspension Commonly known as:  MILK OF MAGNESIA Take 30 mLs by mouth daily as needed for mild constipation. Reported  on 07/27/2015   metroNIDAZOLE 500 MG tablet Commonly known as:  FLAGYL Take 500 mg by mouth 3 (three) times daily.   mirtazapine 15 MG tablet Commonly known as:  REMERON Take 7.5 mg by mouth at bedtime.   morphine 15 MG 12 hr tablet Commonly known as:  MS CONTIN Take 15 mg by mouth 2 (two) times daily.   multivitamin tablet Take 1 tablet by mouth daily.   ondansetron 4 MG tablet Commonly known as:  ZOFRAN Take 4 mg by mouth every 12 (twelve) hours as needed for nausea or vomiting.   phenol 1.4 % Liqd Commonly known as:  CHLORASEPTIC Use as directed 1 spray in the mouth or throat 3 (three) times daily as needed for throat irritation / pain. If sore throat persist notify the MD   potassium chloride 10 MEQ tablet Commonly known as:   K-DUR Take 30 mEq by mouth daily.   rOPINIRole 2 MG tablet Commonly known as:  REQUIP Take 4 mg by mouth at bedtime.   sennosides-docusate sodium 8.6-50 MG tablet Commonly known as:  SENOKOT-S Take 1 tablet by mouth 2 (two) times daily. Hold for diarrhea       Review of Systems  DATA OBTAINED: from patient GENERAL:  no fevers, fatigue, appetite changes SKIN: No itching, rash HEENT: No complaint RESPIRATORY: No cough, wheezing, SOB CARDIAC: No chest pain, palpitations, lower extremity edema  GI: No abdominal pain, No N/V/D or constipation, No heartburn or reflux  GU: No dysuria, frequency or urgency, or incontinence  MUSCULOSKELETAL: No unrelieved bone/joint pain NEUROLOGIC: No headache, dizziness  PSYCHIATRIC: No overt anxiety or sadness   Immunization History  Administered Date(s) Administered  . Influenza Split 04/15/2012  . Influenza,inj,Quad PF,36+ Mos 03/06/2014  . PPD Test 08/30/2014, 09/18/2014  . Pneumococcal Polysaccharide-23 08/29/2013   Pertinent  Health Maintenance Due  Topic Date Due  . INFLUENZA VACCINE  08/22/2016 (Originally 12/25/2015)  . PNA vac Low Risk Adult (2 of 2 - PCV13) 08/22/2016 (Originally 08/30/2014)  . DEXA SCAN  05/27/2023 (Originally 06/09/1985)   Fall Risk  06/30/2013 01/04/2013  Falls in the past year? Yes No  Risk for fall due to : Impaired balance/gait;Impaired mobility;Medication side effect -    Vitals:   02/05/16 1008  BP: (!) 108/50  Pulse: 71  Resp: 18  Temp: 98.1 F (36.7 C)  Weight: 98 lb 9.6 oz (44.7 kg)  Height: 5' (1.524 m)   Body mass index is 19.26 kg/m.  Physical Exam  GENERAL APPEARANCE: Alert, conversant, No acute distress  SKIN: No diaphoresis rash HEENT: Unremarkable RESPIRATORY: Breathing is even, unlabored. Lung sounds are clear   CARDIOVASCULAR: Heart RRR no murmurs, rubs or gallops. No peripheral edema  GASTROINTESTINAL: Abdomen is soft, non-tender, not distended w/ normal bowel sounds.   GENITOURINARY: Bladder non tender, not distended  MUSCULOSKELETAL: No abnormal joints or musculature NEUROLOGIC: Cranial nerves 2-12 grossly intact. Moves all extremities PSYCHIATRIC: Mood and affect appropriate to situation, no behavioral issues  Labs reviewed:  Recent Labs  01/10/16 0838 01/11/16 01/11/16 0751 01/12/16 01/12/16 0536  NA 143 137 137 135* 135  K 3.3* 3.0* 3.0*  --  3.7  CL 107  --  102  --  101  CO2 27  --  26  --  27  GLUCOSE 133*  --  133*  --  120*  BUN 29* 28* 28* 23* 23*  CREATININE 0.71 0.7 0.74 0.5 0.51  CALCIUM 8.8*  --  8.4*  --  8.3*  MG 2.0  --   --   --   --     Recent Labs  08/15/15 12/31/15 01/08/16 0412  AST 15 17 21   ALT 8 10 15   ALKPHOS 89 99 109  BILITOT  --   --  0.5  PROT  --   --  7.6  ALBUMIN  --   --  4.4    Recent Labs  03/28/15 1031  01/08/16 0412  01/10/16 0838 01/11/16 01/11/16 0751 01/12/16 01/12/16 0536  WBC 6.0  < > 26.4*  < > 15.1* 12.6 12.6* 9.4 9.4  NEUTROABS 3.9  --  24.0*  --   --   --   --   --   --   HGB 10.4*  < > 12.2  < > 11.9* 10.9* 10.9*  --  10.5*  HCT 32.5*  < > 39.2  < > 36.9 34* 33.5*  --  32.6*  MCV 85.0  --  86.5  < > 85.2  --  85.0  --  83.8  PLT 250.0  < > 336  < > 311 307 307  --  250  < > = values in this interval not displayed. No results for input(s): CHOL, LDLCALC, TRIG in the last 8760 hours.  Invalid input(s): HCL No results found for: [facility] Lab Results  Component Value Date   TSH 1.438 06/28/2014   Lab Results  Component Value Date   HGBA1C 5.6 06/28/2014   Lab Results  Component Value Date   CHOL 172 03/06/2014   HDL 61 03/06/2014   LDLCALC 88 03/06/2014   TRIG 114 03/06/2014   CHOLHDL 2.8 03/06/2014    Significant Diagnostic Results in last 30 days:  Ct Abdomen Pelvis W Contrast  Result Date: 01/08/2016 CLINICAL DATA:  Nausea and vomiting since 01/07/2016. Elevated white cell count at 26.4. Elevated lipase at 101. EXAM: CT ABDOMEN AND PELVIS WITH CONTRAST  TECHNIQUE: Multidetector CT imaging of the abdomen and pelvis was performed using the standard protocol following bolus administration of intravenous contrast. CONTRAST:  69mL ISOVUE-300 IOPAMIDOL (ISOVUE-300) INJECTION 61% COMPARISON:  04/02/2015 FINDINGS: Atelectasis in the lung bases. Coronary artery calcifications. Small esophageal hiatal hernia. Tiny sub cm low-attenuation lesions in the liver similar to prior study likely representing small cysts. Cholelithiasis with single stone in the gallbladder. No gallbladder wall edema or infiltration. No bile duct dilatation. Pancreas appears somewhat atrophic and there is mild pancreatic ductal dilatation. This is similar to previous study and likely relates to chronic pancreatitis. No definite acute infiltration. The spleen, adrenal glands, kidneys, inferior vena cava, and retroperitoneal lymph nodes are unremarkable. Calcification of the aorta without aneurysm. Distended fluid-filled small bowel loops are present. Terminal ileum is decompressed. Distal jejunal loops demonstrate fold thickening. There is mesenteric edema and a small amount of ascites. Changes suggest small bowel obstruction. Transition zone is not distinctly localized but appears to be in the central pelvis. Wall thickening may indicate the presence of enteritis as well. Inflammatory cause for obstruction should be considered. No free air. Postoperative changes in the anterior abdominal wall with surgical sutures present. Pelvis: Small amount of free fluid in the pelvis. Uterus is surgically absent. Bladder wall is not thickened. No pelvic mass or lymphadenopathy. Degenerative changes in the spine and hips. No destructive bone lesions. IMPRESSION: Distended fluid-filled small bowel with small bowel fold thickening distally, mesenteric edema, and small amount of ascites. Changes likely represent small bowel obstruction possibly with associated enteritis. Transition zone appears to be in  the low pelvis.  Cause is not determined but consider inflammatory etiologies. Electronically Signed   By: Lucienne Capers M.D.   On: 01/08/2016 06:51   Dg Abd 2 Views  Result Date: 01/10/2016 CLINICAL DATA:  Bowel obstruction. EXAM: ABDOMEN - 2 VIEW COMPARISON:  01/09/2016. FINDINGS: Surgical sutures noted over the abdomen. NG tube noted with tip projected over the stomach. NG tube is been slightly advanced. Several air-filled loops of small bowel noted. No free air. Degenerative changes thoracolumbar spine. Terra changes both hips. IMPRESSION: 1. NG tube noted with its tip projected over the upper stomach. G tube is been slightly advanced. 2. Several air-filled loops of minimally prominent small bowel are again noted. No evidence of progressive distention. Colonic gas pattern is normal. No free air. Electronically Signed   By: Marcello Moores  Register   On: 01/10/2016 08:34   Dg Abd 2 Views  Result Date: 01/09/2016 CLINICAL DATA:  Small-bowel obstruction follow-up. EXAM: ABDOMEN - 2 VIEW COMPARISON:  01/09/2016. FINDINGS: NG tube tip noted in the upper stomach. Side hole left gastroesophageal junction. Advancement of the NG tube approximately 6 -8 cm should be considered. Soft tissue structures are unremarkable. Persistent small bowel distention with interim improvement from prior exam. Colonic gas pattern normal. Stool noted in the colon and rectum. No free air. Degenerative changes lumbar spine and both hips. Surgical sutures are noted over the abdomen. IMPRESSION: 1.  Persistent but improving small bowel distention. 2. NG tube tip noted in the upper portion stomach. Side hole noted at the gastroesophageal junction. Advancement of the NG tube tip approximately 6 -8 cm should be considered. Electronically Signed   By: Marcello Moores  Register   On: 01/09/2016 08:38   Dg Abd Portable 1v  Result Date: 01/09/2016 CLINICAL DATA:  PE 82 y/o  F; small bowel obstruction. EXAM: PORTABLE ABDOMEN - 1 VIEW COMPARISON:  CT of abdomen and pelvis  dated 01/08/2016. FINDINGS: No pneumoperitoneum. Persistent dilated loops of small bowel and the mid and lower abdomen. Surgical sutures in material from anterior mesh repair. Bones are diffusely demineralized. Degenerative changes of the right greater than left hip joints and lower lumbar spine. IMPRESSION: Persistent dilated loops of small bowel in the mid and lower abdomen. No pneumoperitoneum. Electronically Signed   By: Kristine Garbe M.D.   On: 01/09/2016 06:28    Assessment/Plan  CHRONIC HIP PAIN/CHRONIC SHOULDER PAIN/ CHRONIC BACK PAIN -  I was completely wrong;pt's pain meds have been able to be decreased by [facility] ; pt seen 9/5 and pt was sent out with tramadol 50 mg QID prn;pt continued MS Contin 15 mf q 12  STAGE 4 ULCER OF SACRUM - still present getting better very slowly, but not worse; will cont wound care per wound care nurse  RLS - controlled;plan to cont requip 4 mg qHS    No diagnosis found.   Family/ staff Communication:   Labs/tests ordered:    [name], MD

## 2016-02-09 ENCOUNTER — Encounter: Payer: Self-pay | Admitting: Internal Medicine

## 2016-02-09 DIAGNOSIS — M25511 Pain in right shoulder: Secondary | ICD-10-CM

## 2016-02-09 DIAGNOSIS — G8929 Other chronic pain: Secondary | ICD-10-CM | POA: Insufficient documentation

## 2016-02-15 ENCOUNTER — Non-Acute Institutional Stay (SKILLED_NURSING_FACILITY): Payer: Medicare Other | Admitting: Internal Medicine

## 2016-02-15 DIAGNOSIS — R300 Dysuria: Secondary | ICD-10-CM

## 2016-02-16 ENCOUNTER — Encounter: Payer: Self-pay | Admitting: Internal Medicine

## 2016-02-16 NOTE — Progress Notes (Signed)
Location:  [facility] of Service:  SNF (31)  Kelton Pillar, MD  Patient Care Team: Lanice Shirts, MD as PCP - General (Internal Medicine) [name], MD as Consulting Physician (Orthopedic Surgery) [name], MD as Consulting Physician (Otolaryngology)  Extended Emergency Contact Information Primary Emergency Contact: [name] Address: [place], [place] [address] [place] of [place] Phone: [phone] Relation: Daughter Secondary Emergency Contact: [place], [place] of [place] Phone: [phone] Relation: Son   Chief Complaint  Patient presents with  . Acute Visit    HPI:  Pt is a 82 y.o. female seen today for new onset  Dysuria , low abd pressure and frequency. No fever, no MS change.   Past Medical History:  Diagnosis Date  . Acute encephalopathy   . AKI (acute kidney injury) ([place])   . Altered mental state   . Anemia   . Aortic stenosis   . Arthritis    Back, knees  . Bowel incontinence   . Colon polyps   . Constipation   . Gastric ulcer    years ago  . Hypertension   . Hypokalemia   . Restless legs syndrome    on Requip  . Situational stress   . Spinal stenosis   . Stroke ([place])    Mini, no residual  . TIA (transient ischemic attack)   . Ulcer causing bleeding and hole in wall of stomach or small intestine   . Urinary bladder calculus   . Urinary incontinence   . Vertigo    Past Surgical History:  Procedure Laterality Date  . COLON RESECTION  50 years ago  . LUMBAR LAMINECTOMY/DECOMPRESSION MICRODISCECTOMY  06/25/2011   Procedure: LUMBAR LAMINECTOMY/DECOMPRESSION MICRODISCECTOMY;  Surgeon: Hosie Spangle, MD;  Location: [facility];  Service: Neurosurgery;  Laterality: Right;  RIGHT Lumbar Two-Three hemilaminectomy and microdiskectomy  . TONSILLECTOMY    . UTERINE FIBROID SURGERY    . VAGINAL HYSTERECTOMY       Allergies  Allergen Reactions  . Amoxicillin Other (See Comments)    Reaction unknown  . Baclofen Other (See Comments)    "fuzzy in the eyes" and dizzy  . Hctz [Hydrochlorothiazide]     nausea  . Valium [Diazepam] Other (See Comments)    Pt became unresponsive and O2 sats dropped.      Medication List       Accurate as of 02/15/16 11:59 PM. Always use your most recent med list.          amLODipine 5 MG tablet Commonly known as:  NORVASC Take 5 mg by mouth daily.   cefdinir 300 MG capsule Commonly known as:  OMNICEF Take 300 mg by mouth 2 (two) times daily.   feeding supplement (PRO-STAT SUGAR FREE 64) Liqd Take 30 mLs by mouth 2 (two) times daily.   feeding supplement Liqd Take 1 Container by mouth 2 (two) times daily between meals.   furosemide 20 MG tablet Commonly known as:  LASIX Take 60 mg by mouth daily.   LINZESS 145 MCG Caps capsule Generic drug:  linaclotide Take 145 mcg by mouth daily before breakfast.   magnesium hydroxide 400 MG/5ML suspension Commonly known as:  MILK OF MAGNESIA Take 30 mLs by mouth daily as needed for mild constipation. Reported on 07/27/2015   metroNIDAZOLE 500 MG tablet Commonly known  as:  FLAGYL Take 500 mg by mouth 3 (three) times daily.   mirtazapine 15 MG tablet Commonly known as:  REMERON Take 7.5 mg by mouth at bedtime.   morphine 15 MG 12 hr tablet Commonly known as:  MS CONTIN Take 15 mg by mouth 2 (two) times daily.   multivitamin tablet Take 1 tablet by mouth daily.   ondansetron 4 MG tablet Commonly known as:  ZOFRAN Take 4 mg by mouth every 12 (twelve) hours as needed for nausea or vomiting.   phenol 1.4 % Liqd Commonly known as:  CHLORASEPTIC Use as directed 1 spray in the mouth or throat 3 (three) times daily as needed for throat irritation / pain. If sore throat persist notify the MD   potassium chloride 10 MEQ tablet Commonly known as:  K-DUR Take 30 mEq by mouth daily.   rOPINIRole 2 MG  tablet Commonly known as:  REQUIP Take 4 mg by mouth at bedtime.   sennosides-docusate sodium 8.6-50 MG tablet Commonly known as:  SENOKOT-S Take 1 tablet by mouth 2 (two) times daily. Hold for diarrhea       Review of Systems  DATA OBTAINED: from patient GENERAL:  no fevers, fatigue, appetite changes SKIN: No itching, rash HEENT: No complaint RESPIRATORY: No cough, wheezing, SOB CARDIAC: No chest pain, palpitations, lower extremity edema  GI: No abdominal pain, No N/V/D or constipation, No heartburn or reflux  GU: + dysuria,+ frequency + urgency, no incontinence  MUSCULOSKELETAL: No unrelieved bone/joint pain NEUROLOGIC: No headache, dizziness  PSYCHIATRIC: No overt anxiety or sadness   Immunization History  Administered Date(s) Administered  . Influenza Split 04/15/2012  . Influenza,inj,Quad PF,36+ Mos 03/06/2014  . PPD Test 08/30/2014, 09/18/2014  . Pneumococcal Polysaccharide-23 08/29/2013   Pertinent  Health Maintenance Due  Topic Date Due  . INFLUENZA VACCINE  08/22/2016 (Originally 12/25/2015)  . PNA vac Low Risk Adult (2 of 2 - PCV13) 08/22/2016 (Originally 08/30/2014)  . DEXA SCAN  05/27/2023 (Originally 06/09/1985)   Fall Risk  06/30/2013 01/04/2013  Falls in the past year? Yes No  Risk for fall due to : Impaired balance/gait;Impaired mobility;Medication side effect -    There were no vitals filed for this visit. There is no height or weight on file to calculate BMI.  Physical Exam  GENERAL APPEARANCE: Alert, conversant, No acute distress  SKIN: No diaphoresis rash HEENT: Unremarkable RESPIRATORY: Breathing is even, unlabored. Lung sounds are clear   CARDIOVASCULAR: Heart RRR no murmurs, rubs or gallops. No peripheral edema  GASTROINTESTINAL: Abdomen is soft,  not distended w/ normal bowel sounds; non tender  GENITOURINARY: Bladder+ tender, not distended  MUSCULOSKELETAL: No abnormal joints or musculature NEUROLOGIC: Cranial nerves 2-12 grossly intact. Moves  all extremities PSYCHIATRIC: Mood and affect appropriate to situation, no behavioral issues  Labs reviewed:  Recent Labs  01/10/16 0838 01/11/16 01/11/16 0751 01/12/16 01/12/16 0536  NA 143 137 137 135* 135  K 3.3* 3.0* 3.0*  --  3.7  CL 107  --  102  --  101  CO2 27  --  26  --  27  GLUCOSE 133*  --  133*  --  120*  BUN 29* 28* 28* 23* 23*  CREATININE 0.71 0.7 0.74 0.5 0.51  CALCIUM 8.8*  --  8.4*  --  8.3*  MG 2.0  --   --   --   --     Recent Labs  08/15/15 12/31/15 01/08/16 0412  AST 15 17 21   ALT 8  10 15  ALKPHOS 89 99 109  BILITOT  --   --  0.5  PROT  --   --  7.6  ALBUMIN  --   --  4.4    Recent Labs  03/28/15 1031  01/08/16 0412  01/10/16 0838 01/11/16 01/11/16 0751 01/12/16 01/12/16 0536  WBC 6.0  < > 26.4*  < > 15.1* 12.6 12.6* 9.4 9.4  NEUTROABS 3.9  --  24.0*  --   --   --   --   --   --   HGB 10.4*  < > 12.2  < > 11.9* 10.9* 10.9*  --  10.5*  HCT 32.5*  < > 39.2  < > 36.9 34* 33.5*  --  32.6*  MCV 85.0  --  86.5  < > 85.2  --  85.0  --  83.8  PLT 250.0  < > 336  < > 311 307 307  --  250  < > = values in this interval not displayed. No results for input(s): CHOL, LDLCALC, TRIG in the last 8760 hours.  Invalid input(s): HCL No results found for: [facility] Lab Results  Component Value Date   TSH 1.438 06/28/2014   Lab Results  Component Value Date   HGBA1C 5.6 06/28/2014   Lab Results  Component Value Date   CHOL 172 03/06/2014   HDL 61 03/06/2014   LDLCALC 88 03/06/2014   TRIG 114 03/06/2014   CHOLHDL 2.8 03/06/2014    Significant Diagnostic Results in last 30 days:  No results found.  Assessment/Plan  PROBABLE UTI- have ordered U/A with C&S- will wait to treat until MIC comes back    Family/ staff Communication:   Labs/tests ordered:    Inocencio Homes, MD

## 2016-02-18 ENCOUNTER — Encounter: Payer: Self-pay | Admitting: Internal Medicine

## 2016-02-22 ENCOUNTER — Encounter: Payer: Self-pay | Admitting: Internal Medicine

## 2016-02-22 ENCOUNTER — Non-Acute Institutional Stay (SKILLED_NURSING_FACILITY): Payer: Medicare Other | Admitting: Internal Medicine

## 2016-02-22 DIAGNOSIS — A498 Other bacterial infections of unspecified site: Secondary | ICD-10-CM

## 2016-02-22 DIAGNOSIS — Z1612 Extended spectrum beta lactamase (ESBL) resistance: Secondary | ICD-10-CM | POA: Diagnosis not present

## 2016-02-22 DIAGNOSIS — B9629 Other Escherichia coli [E. coli] as the cause of diseases classified elsewhere: Secondary | ICD-10-CM

## 2016-02-22 DIAGNOSIS — N390 Urinary tract infection, site not specified: Secondary | ICD-10-CM

## 2016-02-22 NOTE — Progress Notes (Signed)
Location:  [facility] Room Number: [place] of Service:  SNF (63)  [name], MD  Patient Care Team: Lanice Shirts, MD as PCP - General (Internal Medicine) [name], MD as Consulting Physician (Orthopedic Surgery) [name], MD as Consulting Physician (Otolaryngology)  Extended Emergency Contact Information Primary Emergency Contact: [name] Address: [place], [place] [address] [name] of [place] Phone: [phone] Relation: Daughter Secondary Emergency Contact: [place], [place] of [place] Mobile Phone: [phone] Relation: Son    Allergies: Amoxicillin; Baclofen; Hctz [hydrochlorothiazide]; and Valium [diazepam]  Chief Complaint  Patient presents with  . Acute Visit    Acute    HPI: Patient is 82 y.o. female who is being seen for a UTI. Pt had  Dysuria and slt decline in functioning and a U/A was ordered which MIC has returned today. Pt's status did not deteriorate in past 3 days so we were able to wait for the MIC. Pt's urine grew out >100,000 ESBL E Coli. Pt is still having symptoms.  Past Medical History:  Diagnosis Date  . Acute encephalopathy   . AKI (acute kidney injury) ([place])   . Altered mental state   . Anemia   . Aortic stenosis   . Arthritis    Back, knees  . Bowel incontinence   . Colon polyps   . Constipation   . Gastric ulcer    years ago  . Hypertension   . Hypokalemia   . Restless legs syndrome    on Requip  . Situational stress   . Spinal stenosis   . Stroke ([place])    Mini, no residual  . TIA (transient ischemic attack)   . Ulcer causing bleeding and hole in wall of stomach or small intestine   . Urinary bladder calculus   . Urinary incontinence   . Vertigo     Past Surgical History:  Procedure Laterality Date  . COLON RESECTION  50 years ago  . LUMBAR LAMINECTOMY/DECOMPRESSION MICRODISCECTOMY   06/25/2011   Procedure: LUMBAR LAMINECTOMY/DECOMPRESSION MICRODISCECTOMY;  Surgeon: Hosie Spangle, MD;  Location: [facility];  Service: Neurosurgery;  Laterality: Right;  RIGHT Lumbar Two-Three hemilaminectomy and microdiskectomy  . TONSILLECTOMY    . UTERINE FIBROID SURGERY    . VAGINAL HYSTERECTOMY        Medication List       Accurate as of 02/22/16  1:51 PM. Always use your most recent med list.          amLODipine 5 MG tablet Commonly known as:  NORVASC Take 5 mg by mouth daily.   cefdinir 300 MG capsule Commonly known as:  OMNICEF Take 300 mg by mouth 2 (two) times daily.   feeding supplement (PRO-STAT SUGAR FREE 64) Liqd Take 30 mLs by mouth 2 (two) times daily.   feeding supplement Liqd Take 1 Container by mouth 2 (two) times daily between meals.   furosemide 20 MG tablet Commonly known as:  LASIX Take 60 mg by mouth daily.   LINZESS 145 MCG Caps capsule Generic drug:  linaclotide Take 145 mcg by mouth daily before breakfast.   magnesium hydroxide 400 MG/5ML suspension Commonly known as:  MILK OF MAGNESIA Take 30 mLs by mouth daily as needed for mild constipation. Reported on 07/27/2015   metroNIDAZOLE 500 MG tablet Commonly known as:  FLAGYL Take 500 mg by mouth 3 (three) times daily.   mirtazapine 15 MG tablet Commonly known as:  REMERON Take 7.5 mg by mouth at bedtime.   morphine 15 MG 12 hr tablet Commonly known as:  MS CONTIN Take 15 mg by mouth 2 (two) times daily.   multivitamin tablet Take 1 tablet by mouth daily.   ondansetron 4 MG tablet Commonly known as:  ZOFRAN Take 4 mg by mouth every 12 (twelve) hours as needed for nausea or vomiting.   phenol 1.4 % Liqd Commonly known as:  CHLORASEPTIC Use as directed 1 spray in the mouth or throat 3 (three) times daily as needed for throat irritation / pain. If sore throat persist notify the MD   potassium chloride 10 MEQ tablet Commonly known as:  K-DUR Take 30 mEq by mouth daily.     rOPINIRole 2 MG tablet Commonly known as:  REQUIP Take 4 mg by mouth at bedtime.   sennosides-docusate sodium 8.6-50 MG tablet Commonly known as:  SENOKOT-S Take 1 tablet by mouth 2 (two) times daily. Hold for diarrhea       No orders of the defined types were placed in this encounter.   Immunization History  Administered Date(s) Administered  . Influenza Split 04/15/2012  . Influenza,inj,Quad PF,36+ Mos 03/06/2014  . PPD Test 08/30/2014, 09/18/2014  . Pneumococcal Polysaccharide-23 08/29/2013    Social History  Substance Use Topics  . Smoking status: Never Smoker  . Smokeless tobacco: Never Used  . Alcohol use 0.0 oz/week     Comment: rarely    Review of Systems  DATA OBTAINED: from patient, nurse, medical record, family member GENERAL:  no fevers, fatigue, appetite changes SKIN: No itching, rash HEENT: No complaint RESPIRATORY: No cough, wheezing, SOB CARDIAC: No chest pain, palpitations, lower extremity edema  GI: No abdominal pain, No N/V/D or constipation, No heartburn or reflux  GU: No dysuria, frequency or urgency, or incontinence  MUSCULOSKELETAL: No unrelieved bone/joint pain NEUROLOGIC: No headache, dizziness  PSYCHIATRIC: No overt anxiety or sadness  Vitals:   02/22/16 1349  BP: (!) 108/50  Pulse: 88  Resp: 18  Temp: 97.4 F (36.3 C)   Body mass index is 18.94 kg/m. Physical Exam  GENERAL APPEARANCE: Alert, conversant, No acute distress  SKIN: No diaphoresis rash HEENT: Unremarkable RESPIRATORY: Breathing is even, unlabored. Lung sounds are clear   CARDIOVASCULAR: Heart RRR no murmurs, rubs or gallops. No peripheral edema  GASTROINTESTINAL: Abdomen is soft, non-tender, not distended w/ normal bowel sounds.  GENITOURINARY: Bladder  tender, not distended  MUSCULOSKELETAL: No abnormal joints or musculature NEUROLOGIC: Cranial nerves 2-12 grossly intact. Moves all extremities PSYCHIATRIC: Mood and affect appropriate to situation, no behavioral  issues  Patient Active Problem List   Diagnosis Date Noted  . Chronic right shoulder pain 02/09/2016  . Bilateral lower extremity edema 02/02/2016  . Ileitis 01/19/2016  . Pressure ulcer 01/09/2016  . SBO (small bowel obstruction) ([place]) 01/08/2016  . Narcotic addiction ([place]) 05/15/2015  . Diarrhea 03/30/2015  . Acute pharyngitis 02/26/2015  . Osteoarthritis of right knee 02/22/2015  . Pain in joint, lower leg 10/31/2014  . Ulcer of sacral region, stage 4 ([place]) 04/15/2014  . Moderate aortic stenosis 04/14/2014  . Pre-syncope 04/14/2014  . Hypotension 04/14/2014  . Acute on chronic renal failure ([place]) 04/14/2014  . Anemia 04/14/2014  . Protein-calorie malnutrition, severe ([place]) 04/14/2014  . Altered mental status   . Dehydration 04/13/2014  . Altered mental state 04/13/2014  . Acute kidney  injury ([place])   . Hypokalemia 03/25/2014  . Syncope 03/16/2014  . Acute encephalopathy 03/16/2014  . Leg swelling 03/02/2014  . Avascular necrosis of bone of right hip ([place]) 09/27/2013  . Primary osteoarthritis of right hip 09/07/2013  . Spinal stenosis, lumbar region, with neurogenic claudication 09/07/2013  . Closed fracture of pubic ramus ([place]) 09/07/2013  . BPPV (benign paroxysmal positional vertigo) 09/07/2013  . Pharyngeal swelling 08/30/2013  . Pelvic fracture ([place]) 08/28/2013  . Fall 08/26/2013  . Dizziness 08/26/2013  . Abnormal stress test 08/26/2013  . Preop cardiovascular exam 08/01/2013  . Aortic stenosis 08/01/2013  . Edema 06/30/2013  . Loss of weight 01/04/2013  . Situational stress 01/04/2013  . Chronic right hip pain 07/05/2012  . Knee pain, acute 06/29/2012  . Vertigo 06/24/2012  . Acute exacerbation of chronic low back pain 06/24/2012  . Lumbar pain with radiation down right leg 06/09/2012  . Osteoarthritis of hip 04/29/2012  . History of TIAs 04/17/2012  . S/P total hysterectomy and bilateral salpingo-oophorectomy 04/17/2012  . Hard of hearing 04/17/2012  .  History of anemia 04/17/2012  . RLS (restless legs syndrome) 04/17/2012  . Chronic back pain 04/17/2012  . Spinal stenosis, lumbar 04/17/2012  . Lower back pain 12/20/2011  . Personal history of colonic polyps 10/08/2011  . H/O: CVA (cardiovascular accident) 09/14/2011  . Essential hypertension 09/14/2011  . Restless legs syndrome 09/14/2011  . Constipation 08/20/2011  . Urinary incontinence 08/20/2011    CMP     Component Value Date/Time   NA 135 01/12/2016 0536   NA 135 (A) 01/12/2016   K 3.7 01/12/2016 0536   CL 101 01/12/2016 0536   CO2 27 01/12/2016 0536   GLUCOSE 120 (H) 01/12/2016 0536   BUN 23 (H) 01/12/2016 0536   BUN 23 (A) 01/12/2016   CREATININE 0.51 01/12/2016 0536   CREATININE 0.78 06/28/2014 1205   CALCIUM 8.3 (L) 01/12/2016 0536   PROT 7.6 01/08/2016 0412   ALBUMIN 4.4 01/08/2016 0412   AST 21 01/08/2016 0412   ALT 15 01/08/2016 0412   ALKPHOS 109 01/08/2016 0412   BILITOT 0.5 01/08/2016 0412   GFRNONAA >60 01/12/2016 0536   GFRAA >60 01/12/2016 0536    Recent Labs  01/10/16 0838 01/11/16 01/11/16 0751 01/12/16 01/12/16 0536  NA 143 137 137 135* 135  K 3.3* 3.0* 3.0*  --  3.7  CL 107  --  102  --  101  CO2 27  --  26  --  27  GLUCOSE 133*  --  133*  --  120*  BUN 29* 28* 28* 23* 23*  CREATININE 0.71 0.7 0.74 0.5 0.51  CALCIUM 8.8*  --  8.4*  --  8.3*  MG 2.0  --   --   --   --     Recent Labs  08/15/15 12/31/15 01/08/16 0412  AST 15 17 21   ALT 8 10 15   ALKPHOS 89 99 109  BILITOT  --   --  0.5  PROT  --   --  7.6  ALBUMIN  --   --  4.4    Recent Labs  03/28/15 1031  01/08/16 0412  01/10/16 0838 01/11/16 01/11/16 0751 01/12/16 01/12/16 0536  WBC 6.0  < > 26.4*  < > 15.1* 12.6 12.6* 9.4 9.4  NEUTROABS 3.9  --  24.0*  --   --   --   --   --   --   HGB 10.4*  < > 12.2  < >  11.9* 10.9* 10.9*  --  10.5*  HCT 32.5*  < > 39.2  < > 36.9 34* 33.5*  --  32.6*  MCV 85.0  --  86.5  < > 85.2  --  85.0  --  83.8  PLT 250.0  < > 336  < > 311  307 307  --  250  < > = values in this interval not displayed. No results for input(s): CHOL, LDLCALC, TRIG in the last 8760 hours.  Invalid input(s): HCL No results found for: [facility] Lab Results  Component Value Date   TSH 1.438 06/28/2014   Lab Results  Component Value Date   HGBA1C 5.6 06/28/2014   Lab Results  Component Value Date   CHOL 172 03/06/2014   HDL 61 03/06/2014   LDLCALC 88 03/06/2014   TRIG 114 03/06/2014   CHOLHDL 2.8 03/06/2014    Significant Diagnostic Results in last 30 days:  No results found.  Assessment and Plan  ESBL E COLI UTI - pt's CrCl is 37; her renal dose of Imipenem will be 500 mg q 8 hours for 7 days; pt is able to drink so will no need IVF; a PICC line has been ordered   Time spent . 35 min;> 50% of time with patient was spent reviewing records, labs, tests and studies, counseling and developing plan of care  Webb Silversmith D. [name], MD

## 2016-03-04 ENCOUNTER — Encounter: Payer: Self-pay | Admitting: Internal Medicine

## 2016-03-04 ENCOUNTER — Non-Acute Institutional Stay (SKILLED_NURSING_FACILITY): Payer: Medicare Other | Admitting: Internal Medicine

## 2016-03-04 DIAGNOSIS — K5901 Slow transit constipation: Secondary | ICD-10-CM

## 2016-03-04 DIAGNOSIS — I10 Essential (primary) hypertension: Secondary | ICD-10-CM | POA: Diagnosis not present

## 2016-03-04 DIAGNOSIS — G2581 Restless legs syndrome: Secondary | ICD-10-CM | POA: Diagnosis not present

## 2016-03-04 NOTE — Progress Notes (Signed)
Patient ID: Shelly Martin, female   DOB: 06/25/1918, 82 y.o.   MRN: [id number]  Location:  [facility] Room Number: [place] of Service:  SNF (31)  Kelton Pillar, MD  Patient Care Team: Lanice Shirts, MD as PCP - General (Internal Medicine) [name], MD as Consulting Physician (Orthopedic Surgery) [name], MD as Consulting Physician (Otolaryngology)  Extended Emergency Contact Information Primary Emergency Contact: [name] Address: [place], [place] [address] [name] of [place] Phone: [phone] Relation: Daughter Secondary Emergency Contact: [place], [place] of [place] Mobile Phone: [phone] Relation: Son    Allergies: Amoxicillin; Baclofen; Hctz [hydrochlorothiazide]; and Valium [diazepam]  Chief Complaint  Patient presents with  . Medical Management of Chronic Issues    routine visit    HPI: Patient is 82 y.o. female who is being seen for routine issues of RLS, HTN, and constipation. Pt has done very well since her hospitalization has been eating well and been trying to gain weight. Has recovered well fro her ESBL E Coli UTI.  Past Medical History:  Diagnosis Date  . Acute encephalopathy   . AKI (acute kidney injury) ([place])   . Altered mental state   . Anemia   . Aortic stenosis   . Arthritis    Back, knees  . Bowel incontinence   . Colon polyps   . Constipation   . Gastric ulcer    years ago  . Hypertension   . Hypokalemia   . Restless legs syndrome    on Requip  . Situational stress   . Spinal stenosis   . Stroke ([place])    Mini, no residual  . TIA (transient ischemic attack)   . Ulcer causing bleeding and hole in wall of stomach or small intestine   . Urinary bladder calculus   . Urinary incontinence   . Vertigo     Past Surgical History:  Procedure Laterality Date  . COLON RESECTION  50 years ago  .  LUMBAR LAMINECTOMY/DECOMPRESSION MICRODISCECTOMY  06/25/2011   Procedure: LUMBAR LAMINECTOMY/DECOMPRESSION MICRODISCECTOMY;  Surgeon: Hosie Spangle, MD;  Location: [facility];  Service: Neurosurgery;  Laterality: Right;  RIGHT Lumbar Two-Three hemilaminectomy and microdiskectomy  . TONSILLECTOMY    . UTERINE FIBROID SURGERY    . VAGINAL HYSTERECTOMY        Medication List       Accurate as of 03/04/16 11:59 PM. Always use your most recent med list.          amLODipine 5 MG tablet Commonly known as:  NORVASC Take 5 mg by mouth daily.   CHLORASEPTIC MAX SORE THROAT 1.5-33 % Liqd Generic drug:  Phenol-Glycerin Use as directed 1 spray in the mouth or throat 3 (three) times daily as needed.   feeding supplement (PRO-STAT SUGAR FREE 64) Liqd Take 30 mLs by mouth 2 (two) times daily.   feeding supplement Liqd Take 1 Container by mouth 2 (two) times daily between meals.   furosemide 20 MG tablet Commonly known as:  LASIX Take 60 mg by mouth daily.   HYDROcodone-acetaminophen 5-325 MG tablet Commonly known as:  NORCO/VICODIN Take 1 tablet by mouth 4 (four) times daily.   LINZESS 145 MCG Caps capsule Generic drug:  linaclotide Take 145 mcg by mouth daily before breakfast.   magnesium hydroxide 400 MG/5ML suspension Commonly known  as:  MILK OF MAGNESIA Take 30 mLs by mouth daily as needed for mild constipation. Reported on 07/27/2015   mirtazapine 15 MG tablet Commonly known as:  REMERON Take 7.5 mg by mouth at bedtime.   morphine 15 MG 12 hr tablet Commonly known as:  MS CONTIN Take 15 mg by mouth 2 (two) times daily.   multivitamin tablet Take 1 tablet by mouth daily.   ondansetron 4 MG tablet Commonly known as:  ZOFRAN Take 4 mg by mouth every 12 (twelve) hours as needed for nausea or vomiting.   potassium chloride 10 MEQ tablet Commonly known as:  K-DUR Take 30 mEq by mouth daily.   rOPINIRole 2 MG tablet Commonly known as:  REQUIP Take 4 mg by mouth at  bedtime.   sennosides-docusate sodium 8.6-50 MG tablet Commonly known as:  SENOKOT-S Take 1 tablet by mouth 2 (two) times daily. Hold for diarrhea   traMADol 50 MG tablet Commonly known as:  ULTRAM Take 50 mg by mouth every 6 (six) hours as needed.       Meds ordered this encounter  Medications  . HYDROcodone-acetaminophen (NORCO/VICODIN) 5-325 MG tablet    Sig: Take 1 tablet by mouth 4 (four) times daily.  . Phenol-Glycerin (CHLORASEPTIC MAX SORE THROAT) 1.5-33 % LIQD    Sig: Use as directed 1 spray in the mouth or throat 3 (three) times daily as needed.  . traMADol (ULTRAM) 50 MG tablet    Sig: Take 50 mg by mouth every 6 (six) hours as needed.    Immunization History  Administered Date(s) Administered  . Influenza Split 04/15/2012  . Influenza,inj,Quad PF,36+ Mos 03/06/2014  . PPD Test 08/30/2014, 09/18/2014  . Pneumococcal Polysaccharide-23 08/29/2013    Social History  Substance Use Topics  . Smoking status: Never Smoker  . Smokeless tobacco: Never Used  . Alcohol use 0.0 oz/week     Comment: rarely    Review of Systems  DATA OBTAINED: from patient GENERAL:  no fevers, fatigue, appetite changes SKIN: No itching, rash HEENT: No complaint RESPIRATORY: No cough, wheezing, SOB CARDIAC: No chest pain, palpitations, lower extremity edema  GI: No abdominal pain, No N/V/D or constipation, No heartburn or reflux  GU: No dysuria, frequency or urgency, or incontinence  MUSCULOSKELETAL: No unrelieved bone/joint pain NEUROLOGIC: No headache, dizziness  PSYCHIATRIC: No overt anxiety or sadness  Vitals:   03/04/16 1048  BP: (!) 109/58  Pulse: 79  Resp: 16  Temp: 97.5 F (36.4 C)   Body mass index is 18.36 kg/m. Physical Exam  GENERAL APPEARANCE: Alert, conversant, No acute distress  SKIN: No diaphoresis rash HEENT: Unremarkable RESPIRATORY: Breathing is even, unlabored. Lung sounds are clear   CARDIOVASCULAR: Heart RRR no murmurs, rubs or gallops. No  peripheral edema  GASTROINTESTINAL: Abdomen is soft, non-tender, not distended w/ normal bowel sounds.  GENITOURINARY: Bladder non tender, not distended  MUSCULOSKELETAL: No abnormal joints or musculature NEUROLOGIC: Cranial nerves 2-12 grossly intact. Moves all extremities PSYCHIATRIC: Mood and affect appropriate to situation, no behavioral issues  Patient Active Problem List   Diagnosis Date Noted  . Chronic right shoulder pain 02/09/2016  . Bilateral lower extremity edema 02/02/2016  . Ileitis 01/19/2016  . Pressure ulcer 01/09/2016  . SBO (small bowel obstruction) 01/08/2016  . Narcotic addiction ([place]) 05/15/2015  . Diarrhea 03/30/2015  . Acute pharyngitis 02/26/2015  . Osteoarthritis of right knee 02/22/2015  . Pain in joint, lower leg 10/31/2014  . Ulcer of sacral region, stage 4 ([place]) 04/15/2014  .  Moderate aortic stenosis 04/14/2014  . Pre-syncope 04/14/2014  . Hypotension 04/14/2014  . Acute on chronic renal failure ([place]) 04/14/2014  . Anemia 04/14/2014  . Protein-calorie malnutrition, severe ([place]) 04/14/2014  . Altered mental status   . Dehydration 04/13/2014  . Altered mental state 04/13/2014  . Acute kidney injury ([place])   . Hypokalemia 03/25/2014  . Syncope 03/16/2014  . Acute encephalopathy 03/16/2014  . Leg swelling 03/02/2014  . Avascular necrosis of bone of right hip ([place]) 09/27/2013  . Primary osteoarthritis of right hip 09/07/2013  . Spinal stenosis, lumbar region, with neurogenic claudication 09/07/2013  . Closed fracture of pubic ramus ([place]) 09/07/2013  . BPPV (benign paroxysmal positional vertigo) 09/07/2013  . Pharyngeal swelling 08/30/2013  . Pelvic fracture ([place]) 08/28/2013  . Fall 08/26/2013  . Dizziness 08/26/2013  . Abnormal stress test 08/26/2013  . Preop cardiovascular exam 08/01/2013  . Aortic stenosis 08/01/2013  . Edema 06/30/2013  . Loss of weight 01/04/2013  . Situational stress 01/04/2013  . Chronic right hip pain 07/05/2012  . Knee  pain, acute 06/29/2012  . Vertigo 06/24/2012  . Acute exacerbation of chronic low back pain 06/24/2012  . Lumbar pain with radiation down right leg 06/09/2012  . Osteoarthritis of hip 04/29/2012  . History of TIAs 04/17/2012  . S/P total hysterectomy and bilateral salpingo-oophorectomy 04/17/2012  . Hard of hearing 04/17/2012  . History of anemia 04/17/2012  . RLS (restless legs syndrome) 04/17/2012  . Chronic back pain 04/17/2012  . Spinal stenosis, lumbar 04/17/2012  . Lower back pain 12/20/2011  . Personal history of colonic polyps 10/08/2011  . H/O: CVA (cardiovascular accident) 09/14/2011  . Essential hypertension 09/14/2011  . Restless legs syndrome 09/14/2011  . Constipation 08/20/2011  . Urinary incontinence 08/20/2011    CMP     Component Value Date/Time   NA 138 02/04/2016   K 4.8 02/04/2016   CL 101 01/12/2016 0536   CO2 27 01/12/2016 0536   GLUCOSE 120 (H) 01/12/2016 0536   BUN 34 (A) 02/04/2016   CREATININE 1.0 02/04/2016   CREATININE 0.51 01/12/2016 0536   CREATININE 0.78 06/28/2014 1205   CALCIUM 8.3 (L) 01/12/2016 0536   PROT 7.6 01/08/2016 0412   ALBUMIN 4.4 01/08/2016 0412   AST 21 01/08/2016 0412   ALT 15 01/08/2016 0412   ALKPHOS 109 01/08/2016 0412   BILITOT 0.5 01/08/2016 0412   GFRNONAA >60 01/12/2016 0536   GFRAA >60 01/12/2016 0536    Recent Labs  01/10/16 0838  01/11/16 0751 01/12/16 01/12/16 0536 02/04/16  NA 143  < > 137 135* 135 138  K 3.3*  < > 3.0*  --  3.7 4.8  CL 107  --  102  --  101  --   CO2 27  --  26  --  27  --   GLUCOSE 133*  --  133*  --  120*  --   BUN 29*  < > 28* 23* 23* 34*  CREATININE 0.71  < > 0.74 0.5 0.51 1.0  CALCIUM 8.8*  --  8.4*  --  8.3*  --   MG 2.0  --   --   --   --   --   < > = values in this interval not displayed.  Recent Labs  08/15/15 12/31/15 01/08/16 0412  AST 15 17 21   ALT 8 10 15   ALKPHOS 89 99 109  BILITOT  --   --  0.5  PROT  --   --  7.6  ALBUMIN  --   --  4.4    Recent Labs   03/28/15 1031  01/08/16 0412  01/10/16 0838 01/11/16 01/11/16 0751 01/12/16 01/12/16 0536  WBC 6.0  < > 26.4*  < > 15.1* 12.6 12.6* 9.4 9.4  NEUTROABS 3.9  --  24.0*  --   --   --   --   --   --   HGB 10.4*  < > 12.2  < > 11.9* 10.9* 10.9*  --  10.5*  HCT 32.5*  < > 39.2  < > 36.9 34* 33.5*  --  32.6*  MCV 85.0  --  86.5  < > 85.2  --  85.0  --  83.8  PLT 250.0  < > 336  < > 311 307 307  --  250  < > = values in this interval not displayed. No results for input(s): CHOL, LDLCALC, TRIG in the last 8760 hours.  Invalid input(s): HCL No results found for: [facility] Lab Results  Component Value Date   TSH 1.438 06/28/2014   Lab Results  Component Value Date   HGBA1C 5.6 06/28/2014   Lab Results  Component Value Date   CHOL 172 03/06/2014   HDL 61 03/06/2014   LDLCALC 88 03/06/2014   TRIG 114 03/06/2014   CHOLHDL 2.8 03/06/2014    Significant Diagnostic Results in last 30 days:  No results found.  Assessment and Plan  RLS (restless legs syndrome) Controlled ; plan to cont requip 4 mg qHS  Essential hypertension Controlled ; plan to cont norvasc 5 mg and lasix 40 mg daily  Constipation Controlled;cont senokot -S daily and linzess daily    Inocencio Homes, MD

## 2016-03-15 ENCOUNTER — Encounter: Payer: Self-pay | Admitting: Internal Medicine

## 2016-03-15 NOTE — Assessment & Plan Note (Signed)
Controlled ; plan to cont requip 4 mg qHS

## 2016-03-15 NOTE — Assessment & Plan Note (Signed)
Controlled;cont senokot -S daily and linzess daily

## 2016-03-15 NOTE — Assessment & Plan Note (Signed)
Controlled ; plan to cont norvasc 5 mg and lasix 40 mg daily

## 2016-03-18 LAB — VITAMIN D 25 HYDROXY (VIT D DEFICIENCY, FRACTURES): Vit D, 25-Hydroxy: 31

## 2016-03-19 LAB — LIPID PANEL
Cholesterol: 141 mg/dL (ref 0–200)
HDL: 55 mg/dL (ref 35–70)
LDL Cholesterol: 71 mg/dL
Triglycerides: 77 mg/dL (ref 40–160)

## 2016-03-19 LAB — HEMOGLOBIN A1C: HEMOGLOBIN A1C: 5.6

## 2016-03-19 LAB — CBC AND DIFFERENTIAL
HCT: 30 % — AB (ref 36–46)
Hemoglobin: 9.6 g/dL — AB (ref 12.0–16.0)
PLATELETS: 278 10*3/uL (ref 150–399)
WBC: 8 10*3/mL

## 2016-03-19 LAB — BASIC METABOLIC PANEL
BUN: 40 mg/dL — AB (ref 4–21)
CREATININE: 1.2 mg/dL — AB (ref 0.5–1.1)
GLUCOSE: 125 mg/dL
Potassium: 4.6 mmol/L (ref 3.4–5.3)
Sodium: 136 mmol/L — AB (ref 137–147)

## 2016-03-19 LAB — HEPATIC FUNCTION PANEL
ALK PHOS: 104 U/L (ref 25–125)
ALT: 12 U/L (ref 7–35)
AST: 21 U/L (ref 13–35)
BILIRUBIN, TOTAL: 0.6 mg/dL

## 2016-03-19 LAB — TSH: TSH: 2.09 u[IU]/mL (ref 0.41–5.90)

## 2016-04-04 ENCOUNTER — Non-Acute Institutional Stay (SKILLED_NURSING_FACILITY): Payer: Medicare Other | Admitting: Internal Medicine

## 2016-04-04 ENCOUNTER — Encounter: Payer: Self-pay | Admitting: Internal Medicine

## 2016-04-04 DIAGNOSIS — L89154 Pressure ulcer of sacral region, stage 4: Secondary | ICD-10-CM | POA: Diagnosis not present

## 2016-04-04 DIAGNOSIS — G8929 Other chronic pain: Secondary | ICD-10-CM

## 2016-04-04 DIAGNOSIS — F1120 Opioid dependence, uncomplicated: Secondary | ICD-10-CM | POA: Diagnosis not present

## 2016-04-04 DIAGNOSIS — L98429 Non-pressure chronic ulcer of back with unspecified severity: Secondary | ICD-10-CM

## 2016-04-04 DIAGNOSIS — M25551 Pain in right hip: Secondary | ICD-10-CM | POA: Diagnosis not present

## 2016-04-04 NOTE — Progress Notes (Signed)
Location:  [facility] Room Number: 303D Place of Service:  SNF [phone])  [name], MD  Patient Care Team: Lanice Shirts, MD as PCP - General (Internal Medicine) [name], MD as Consulting Physician (Orthopedic Surgery) [name], MD as Consulting Physician (Otolaryngology)  Extended Emergency Contact Information Primary Emergency Contact: [name] Address: [place], [place] [address] [name] of [place] Phone: [phone] Relation: Daughter Secondary Emergency Contact: [place], [place] of [place] Mobile Phone: [phone] Relation: Son    Allergies: Amoxicillin; Baclofen; Hctz [hydrochlorothiazide]; and Valium [diazepam]  Chief Complaint  Patient presents with  . Medical Management of Chronic Issues    Routine Visit    HPI: Patient is 82 y.o. female who is being seen for routine issues of stage 4 sacral ulcer, healed, R hip pain and narcotic addiction.  Past Medical History:  Diagnosis Date  . Acute encephalopathy   . AKI (acute kidney injury) ([place])   . Altered mental state   . Anemia   . Aortic stenosis   . Arthritis    Back, knees  . Bowel incontinence   . Colon polyps   . Constipation   . Gastric ulcer    years ago  . Hypertension   . Hypokalemia   . Restless legs syndrome    on Requip  . Situational stress   . Spinal stenosis   . Stroke ([place])    Mini, no residual  . TIA (transient ischemic attack)   . Ulcer causing bleeding and hole in wall of stomach or small intestine   . Urinary bladder calculus   . Urinary incontinence   . Vertigo     Past Surgical History:  Procedure Laterality Date  . COLON RESECTION  50 years ago  . LUMBAR LAMINECTOMY/DECOMPRESSION MICRODISCECTOMY  06/25/2011   Procedure: LUMBAR LAMINECTOMY/DECOMPRESSION MICRODISCECTOMY;  Surgeon: Hosie Spangle, MD;  Location: [facility];  Service:  Neurosurgery;  Laterality: Right;  RIGHT Lumbar Two-Three hemilaminectomy and microdiskectomy  . TONSILLECTOMY    . UTERINE FIBROID SURGERY    . VAGINAL HYSTERECTOMY        Medication List       Accurate as of 04/04/16 11:59 PM. Always use your most recent med list.          acetaminophen 325 MG tablet Commonly known as:  TYLENOL Take 650 mg by mouth every 6 (six) hours as needed. Notify MD if not relieved. Not to exceed 3000 mg in 24 hour period.   amLODipine 5 MG tablet Commonly known as:  NORVASC Take 5 mg by mouth daily.   CHLORASEPTIC MAX SORE THROAT 1.5-33 % Liqd Generic drug:  Phenol-Glycerin Use as directed 1 spray in the mouth or throat 3 (three) times daily as needed.   feeding supplement Liqd Take 1 Container by mouth 2 (two) times daily between meals.   furosemide 20 MG tablet Commonly known as:  LASIX Take 20 mg by mouth daily. Take 1 tab by mouth daily along with lasix 40 mg tab to equal total dose of 60 mg.   furosemide 40 MG tablet Commonly known as:  LASIX Take 40 mg by mouth daily. Take 1 tab by mouth daily along with lasix 40 mg tab to equal total dose of 60 mg   HYDROcodone-acetaminophen 5-325 MG tablet Commonly known as:  NORCO/VICODIN  Take 1 tablet by mouth 4 (four) times daily.   LINZESS 145 MCG Caps capsule Generic drug:  linaclotide Take 145 mcg by mouth daily before breakfast.   magnesium hydroxide 400 MG/5ML suspension Commonly known as:  MILK OF MAGNESIA Take 30 mLs by mouth daily as needed for mild constipation. Reported on 07/27/2015   mirtazapine 15 MG tablet Commonly known as:  REMERON Take 7.5 mg by mouth at bedtime.   morphine 15 MG 12 hr tablet Commonly known as:  MS CONTIN Take 15 mg by mouth 2 (two) times daily.   multivitamin tablet Take 1 tablet by mouth daily.   ondansetron 4 MG tablet Commonly known as:  ZOFRAN Take 4 mg by mouth every 12 (twelve) hours as needed for nausea or vomiting.   potassium chloride 10 MEQ  tablet Commonly known as:  K-DUR Take 30 mEq by mouth daily.   rOPINIRole 4 MG tablet Commonly known as:  REQUIP Take 4 mg by mouth at bedtime.   sennosides-docusate sodium 8.6-50 MG tablet Commonly known as:  SENOKOT-S Take 1 tablet by mouth 2 (two) times daily. Hold for diarrhea   Vitamin D3 50000 units Caps Take 1 capsule by mouth daily.       No orders of the defined types were placed in this encounter.   Immunization History  Administered Date(s) Administered  . Influenza Split 04/15/2012  . Influenza,inj,Quad PF,36+ Mos 03/06/2014  . Influenza-Unspecified 03/01/2016  . PPD Test 08/30/2014, 09/18/2014  . Pneumococcal Polysaccharide-23 08/29/2013    Social History  Substance Use Topics  . Smoking status: Never Smoker  . Smokeless tobacco: Never Used  . Alcohol use 0.0 oz/week     Comment: rarely    Review of Systems  DATA OBTAINED: from patient, nurse GENERAL:  no fevers, fatigue, appetite changes SKIN: No itching, rash HEENT: No complaint RESPIRATORY: No cough, wheezing, SOB CARDIAC: No chest pain, palpitations, lower extremity edema  GI: No abdominal pain, No N/V/D or constipation, No heartburn or reflux  GU: No dysuria, frequency or urgency, or incontinence  MUSCULOSKELETAL: No unrelieved bone/joint pain NEUROLOGIC: No headache, dizziness  PSYCHIATRIC: No overt anxiety or sadness  Vitals:   04/04/16 0911  BP: (!) 113/55  Pulse: 76  Resp: 18  Temp: 98.4 F (36.9 C)   Body mass index is 19.53 kg/m. Physical Exam  GENERAL APPEARANCE: Alert, conversant, No acute distress  SKIN: No diaphoresis rash HEENT: Unremarkable RESPIRATORY: Breathing is even, unlabored. Lung sounds are clear   CARDIOVASCULAR: Heart RRR no murmurs, rubs or gallops. No peripheral edema  GASTROINTESTINAL: Abdomen is soft, non-tender, not distended w/ normal bowel sounds.  GENITOURINARY: Bladder non tender, not distended  MUSCULOSKELETAL: No abnormal joints or  musculature NEUROLOGIC: Cranial nerves 2-12 grossly intact. Moves all extremities PSYCHIATRIC: Mood and affect appropriate to situation with some dementia, no behavioral issues  Patient Active Problem List   Diagnosis Date Noted  . Chronic right shoulder pain 02/09/2016  . Bilateral lower extremity edema 02/02/2016  . Ileitis 01/19/2016  . Pressure ulcer 01/09/2016  . SBO (small bowel obstruction) 01/08/2016  . Narcotic addiction ([place]) 05/15/2015  . Diarrhea 03/30/2015  . Acute pharyngitis 02/26/2015  . Osteoarthritis of right knee 02/22/2015  . Pain in joint, lower leg 10/31/2014  . Ulcer of sacral region, stage 4 ([place]) 04/15/2014  . Moderate aortic stenosis 04/14/2014  . Pre-syncope 04/14/2014  . Hypotension 04/14/2014  . Acute on chronic renal failure ([place]) 04/14/2014  . Anemia 04/14/2014  . Protein-calorie malnutrition, severe ([place])  04/14/2014  . Altered mental status   . Dehydration 04/13/2014  . Altered mental state 04/13/2014  . Acute kidney injury ([place])   . Hypokalemia 03/25/2014  . Syncope 03/16/2014  . Acute encephalopathy 03/16/2014  . Leg swelling 03/02/2014  . Avascular necrosis of bone of right hip ([place]) 09/27/2013  . Primary osteoarthritis of right hip 09/07/2013  . Spinal stenosis, lumbar region, with neurogenic claudication 09/07/2013  . Closed fracture of pubic ramus ([place]) 09/07/2013  . BPPV (benign paroxysmal positional vertigo) 09/07/2013  . Pharyngeal swelling 08/30/2013  . Pelvic fracture ([place]) 08/28/2013  . Fall 08/26/2013  . Dizziness 08/26/2013  . Abnormal stress test 08/26/2013  . Preop cardiovascular exam 08/01/2013  . Aortic stenosis 08/01/2013  . Edema 06/30/2013  . Loss of weight 01/04/2013  . Situational stress 01/04/2013  . Chronic right hip pain 07/05/2012  . Knee pain, acute 06/29/2012  . Vertigo 06/24/2012  . Acute exacerbation of chronic low back pain 06/24/2012  . Lumbar pain with radiation down right leg 06/09/2012  .  Osteoarthritis of hip 04/29/2012  . History of TIAs 04/17/2012  . S/P total hysterectomy and bilateral salpingo-oophorectomy 04/17/2012  . Hard of hearing 04/17/2012  . History of anemia 04/17/2012  . RLS (restless legs syndrome) 04/17/2012  . Chronic back pain 04/17/2012  . Spinal stenosis, lumbar 04/17/2012  . Lower back pain 12/20/2011  . Personal history of colonic polyps 10/08/2011  . H/O: CVA (cardiovascular accident) 09/14/2011  . Essential hypertension 09/14/2011  . Restless legs syndrome 09/14/2011  . Constipation 08/20/2011  . Urinary incontinence 08/20/2011    CMP     Component Value Date/Time   NA 136 (A) 03/19/2016   K 4.6 03/19/2016   CL 101 01/12/2016 0536   CO2 27 01/12/2016 0536   GLUCOSE 120 (H) 01/12/2016 0536   BUN 40 (A) 03/19/2016   CREATININE 1.2 (A) 03/19/2016   CREATININE 0.51 01/12/2016 0536   CREATININE 0.78 06/28/2014 1205   CALCIUM 8.3 (L) 01/12/2016 0536   PROT 7.6 01/08/2016 0412   ALBUMIN 4.4 01/08/2016 0412   AST 21 03/19/2016   ALT 12 03/19/2016   ALKPHOS 104 03/19/2016   BILITOT 0.5 01/08/2016 0412   GFRNONAA >60 01/12/2016 0536   GFRAA >60 01/12/2016 0536    Recent Labs  01/10/16 0838  01/11/16 0751  01/12/16 0536 02/04/16 03/19/16  NA 143  < > 137  < > 135 138 136*  K 3.3*  < > 3.0*  --  3.7 4.8 4.6  CL 107  --  102  --  101  --   --   CO2 27  --  26  --  27  --   --   GLUCOSE 133*  --  133*  --  120*  --   --   BUN 29*  < > 28*  < > 23* 34* 40*  CREATININE 0.71  < > 0.74  < > 0.51 1.0 1.2*  CALCIUM 8.8*  --  8.4*  --  8.3*  --   --   MG 2.0  --   --   --   --   --   --   < > = values in this interval not displayed.  Recent Labs  12/31/15 01/08/16 0412 03/19/16  AST 17 21 21   ALT 10 15 12   ALKPHOS 99 109 104  BILITOT  --  0.5  --   PROT  --  7.6  --   ALBUMIN  --  4.4  --     Recent Labs  01/08/16 0412  01/10/16 0838  01/11/16 0751 01/12/16 01/12/16 0536 03/19/16  WBC 26.4*  < > 15.1*  < > 12.6* 9.4 9.4 8.0   NEUTROABS 24.0*  --   --   --   --   --   --   --   HGB 12.2  < > 11.9*  < > 10.9*  --  10.5* 9.6*  HCT 39.2  < > 36.9  < > 33.5*  --  32.6* 30*  MCV 86.5  < > 85.2  --  85.0  --  83.8  --   PLT 336  < > 311  < > 307  --  250 278  < > = values in this interval not displayed.  Recent Labs  03/19/16  CHOL 141  LDLCALC 71  TRIG 77   No results found for: [facility] Lab Results  Component Value Date   TSH 2.09 03/19/2016   Lab Results  Component Value Date   HGBA1C 5.6 03/19/2016   Lab Results  Component Value Date   CHOL 141 03/19/2016   HDL 55 03/19/2016   LDLCALC 71 03/19/2016   TRIG 77 03/19/2016   CHOLHDL 2.8 03/06/2014    Significant Diagnostic Results in last 30 days:  No results found.  Assessment and Plan  Ulcer of sacral region, stage 4 ([place]) Healed; a problem no longer   Chronic right hip pain Continues with hip pain but rarely complains to me; pt pain meds have been decreased to norco 7.5 mg 5X daily prn for 30 days per pain clinic  Narcotic addiction ([place]) Pt  And preferred pain management clinic have done an excellent job;I never thought to see it;pt is now on Norco7.5 mg 5X daily prn for 30 days; really excellent compared to a year ago     Webb Silversmith D. [name], MD

## 2016-04-06 ENCOUNTER — Encounter: Payer: Self-pay | Admitting: Internal Medicine

## 2016-04-06 NOTE — Assessment & Plan Note (Deleted)
Controlled on requip;plan to cont requip 4 mg qHS

## 2016-04-06 NOTE — Assessment & Plan Note (Signed)
Continues with hip pain but rarely complains to me; pt pain meds have been decreased to norco 7.5 mg 5X daily prn for 30 days per pain clinic

## 2016-04-06 NOTE — Assessment & Plan Note (Signed)
Healed; a problem no longer

## 2016-04-06 NOTE — Assessment & Plan Note (Signed)
Pt  And preferred pain management clinic have done an excellent job;I never thought to see it;pt is now on Norco7.5 mg 5X daily prn for 30 days; really excellent compared to a year ago

## 2016-04-25 ENCOUNTER — Non-Acute Institutional Stay (SKILLED_NURSING_FACILITY): Payer: Medicare Other | Admitting: Internal Medicine

## 2016-04-25 ENCOUNTER — Encounter: Payer: Self-pay | Admitting: Internal Medicine

## 2016-04-25 DIAGNOSIS — M5431 Sciatica, right side: Secondary | ICD-10-CM | POA: Diagnosis not present

## 2016-04-25 DIAGNOSIS — R110 Nausea: Secondary | ICD-10-CM

## 2016-04-25 DIAGNOSIS — R600 Localized edema: Secondary | ICD-10-CM

## 2016-04-25 NOTE — Progress Notes (Signed)
Location:  [facility] Room Number: 303D Place of Service:  SNF [phone])  [name], MD  Patient Care Team: Lanice Shirts, MD as PCP - General (Internal Medicine) [name], MD as Consulting Physician (Orthopedic Surgery) [name], MD as Consulting Physician (Otolaryngology)  Extended Emergency Contact Information Primary Emergency Contact: [name] Address: [place], [place] [address] [name] of [place] Phone: [phone] Relation: Daughter Secondary Emergency Contact: [place], [place] of [place] Mobile Phone: [phone] Relation: Son    Allergies: Amoxicillin; Baclofen; Hctz [hydrochlorothiazide]; and Valium [diazepam]  Chief Complaint  Patient presents with  . Acute Visit    Acute    HPI: Patient is 82 y.o. female who is being seen acutely. She stopped me in the hall and has several issues she wishes to discuss with me.One is her LE edema. Its worse than it has ever ben. She won't leave her room now because of it. She admits that the swelling goes away overnight but comes back again during the day. It is better when she puts her feet up and worse when she leaves room for activities. No SOB or CP. TED hose improves re situatuin but she dreads putting them on daily, it hurts. Two-pt is having nausea frequently, not daily, but almost. Goes away with zofran. Denies stomach pain or reflux. Denies constipation. Three-pt's sciatica is really hurting her, is not made better by the meds the pain clinic gives her. She denies she has ever been on neurontin or lyrica.  Past Medical History:  Diagnosis Date  . Acute encephalopathy   . AKI (acute kidney injury) ([facility])   . Altered mental state   . Anemia   . Aortic stenosis   . Arthritis    Back, knees  . Bowel incontinence   . Colon polyps   . Constipation   . Gastric ulcer    years ago  .  Hypertension   . Hypokalemia   . Restless legs syndrome    on Requip  . Situational stress   . Spinal stenosis   . Stroke ([facility])    Mini, no residual  . TIA (transient ischemic attack)   . Ulcer causing bleeding and hole in wall of stomach or small intestine   . Urinary bladder calculus   . Urinary incontinence   . Vertigo     Past Surgical History:  Procedure Laterality Date  . COLON RESECTION  50 years ago  . LUMBAR LAMINECTOMY/DECOMPRESSION MICRODISCECTOMY  06/25/2011   Procedure: LUMBAR LAMINECTOMY/DECOMPRESSION MICRODISCECTOMY;  Surgeon: Hosie Spangle, MD;  Location: [facility];  Service: Neurosurgery;  Laterality: Right;  RIGHT Lumbar Two-Three hemilaminectomy and microdiskectomy  . TONSILLECTOMY    . UTERINE FIBROID SURGERY    . VAGINAL HYSTERECTOMY        Medication List       Accurate as of 04/25/16  4:11 PM. Always use your most recent med list.          acetaminophen 325 MG tablet Commonly known as:  TYLENOL Take 650 mg by mouth every 6 (six) hours as needed. Notify MD if not relieved. Not to exceed 3000 mg in 24 hour period.   amLODipine 5 MG tablet Commonly known as:  NORVASC Take 5 mg by mouth daily.   CHLORASEPTIC MAX SORE THROAT 1.5-33 % Liqd Generic drug:  Phenol-Glycerin Use as directed 1 spray in the mouth or throat 3 (three) times daily as needed. For throat irritation/pain. If sore throat persist notify the MD   feeding supplement Liqd Take 1 Container by mouth 2 (two) times daily between meals.   furosemide 20 MG tablet Commonly known as:  LASIX Take 20 mg by mouth daily. Take 1 tab by mouth daily along with lasix 40 mg tab to equal total dose of 60 mg.   furosemide 40 MG tablet Commonly known as:  LASIX Take 40 mg by mouth daily. Take 1 tab by mouth daily along with lasix 40 mg tab to equal total dose of 60 mg   HYDROcodone-acetaminophen 7.5-325 MG tablet Commonly known as:  NORCO Take 1 tablet by mouth daily at 12 noon.     hypromellose 0.3 % Gel ophthalmic ointment Commonly known as:  GENTEAL Place 1 application into both eyes 2 (two) times daily. For dry eyes. Referral to retina specialist for evaluation.   lubiprostone 24 MCG capsule Commonly known as:  AMITIZA Take 24 mcg by mouth 2 (two) times daily with a meal.   magnesium hydroxide 400 MG/5ML suspension Commonly known as:  MILK OF MAGNESIA Take 30 mLs by mouth daily as needed for mild constipation. Reported on 07/27/2015   mirtazapine 15 MG tablet Commonly known as:  REMERON Take 7.5 mg by mouth at bedtime.   morphine 15 MG 12 hr tablet Commonly known as:  MS CONTIN Take 15 mg by mouth 2 (two) times daily.   multivitamin tablet Take 1 tablet by mouth daily.   ondansetron 4 MG tablet Commonly known as:  ZOFRAN Take 4 mg by mouth every 12 (twelve) hours as needed for nausea or vomiting.   potassium chloride 10 MEQ tablet Commonly known as:  K-DUR Take 30 mEq by mouth daily. 3 caps   rOPINIRole 4 MG tablet Commonly known as:  REQUIP Take 4 mg by mouth at bedtime.   sennosides-docusate sodium 8.6-50 MG tablet Commonly known as:  SENOKOT-S Take 1 tablet by mouth 2 (two) times daily. Hold for diarrhea   Vitamin D3 50000 units Caps Take 1 capsule by mouth once a week. On Wednesday.       Meds ordered this encounter  Medications  . hypromellose (GENTEAL) 0.3 % GEL ophthalmic ointment    Sig: Place 1 application into both eyes 2 (two) times daily. For dry eyes. Referral to retina specialist for evaluation.  Marland Kitchen HYDROcodone-acetaminophen (NORCO) 7.5-325 MG tablet    Sig: Take 1 tablet by mouth daily at 12 noon.  . lubiprostone (AMITIZA) 24 MCG capsule    Sig: Take 24 mcg by mouth 2 (two) times daily with a meal.    Immunization History  Administered Date(s) Administered  . Influenza Split 04/15/2012  . Influenza,inj,Quad PF,36+ Mos 03/06/2014  . Influenza-Unspecified 03/01/2016  . PPD Test 08/30/2014, 09/18/2014  . Pneumococcal  Polysaccharide-23 08/29/2013    Social History  Substance Use Topics  . Smoking status: Never Smoker  . Smokeless tobacco: Never Used  . Alcohol use 0.0 oz/week     Comment: rarely    Review of Systems  DATA OBTAINED: from patient GENERAL:  no fevers, fatigue, appetite changes SKIN: No itching, rash HEENT: No complaint RESPIRATORY: No cough, wheezing, SOB CARDIAC: No chest pain, palpitations, +lower extremity edema  GI: No abdominal pain, no V/D or constipation, No heartburn or reflux + nausea GU: No dysuria, frequency or urgency, or incontinence  MUSCULOSKELETAL: No unrelieved bone/joint pain NEUROLOGIC:  No headache, dizziness ;+ sciatic pain PSYCHIATRIC: No overt anxiety or sadness  Vitals:   04/25/16 1539  BP: (!) 100/58  Pulse: 76  Resp: 18  Temp: 98.6 F (37 C)   Body mass index is 19.8 kg/m. Physical Exam  GENERAL APPEARANCE: Alert, conversant, No acute distress  SKIN: No diaphoresis rash HEENT: Unremarkable RESPIRATORY: Breathing is even, unlabored. Lung sounds are clear   CARDIOVASCULAR: Heart RRR no murmurs, rubs or gallops. 1-2+ peripheral edema mostly distal legs ankles,feet GASTROINTESTINAL: Abdomen is soft, non-tender, not distended w/ normal bowel sounds.  GENITOURINARY: Bladder non tender, not distended  MUSCULOSKELETAL: No abnormal joints or musculature NEUROLOGIC: Cranial nerves 2-12 grossly intact. Moves all extremities PSYCHIATRIC: Mood and affect appropriate to situation, no behavioral issues  Patient Active Problem List   Diagnosis Date Noted  . Chronic right shoulder pain 02/09/2016  . Bilateral lower extremity edema 02/02/2016  . Ileitis 01/19/2016  . Pressure ulcer 01/09/2016  . SBO (small bowel obstruction) 01/08/2016  . Narcotic addiction ([facility]) 05/15/2015  . Diarrhea 03/30/2015  . Acute pharyngitis 02/26/2015  . Osteoarthritis of right knee 02/22/2015  . Pain in joint, lower leg 10/31/2014  . Ulcer of sacral region, stage 4 ([facility])  04/15/2014  . Moderate aortic stenosis 04/14/2014  . Pre-syncope 04/14/2014  . Hypotension 04/14/2014  . Acute on chronic renal failure ([facility]) 04/14/2014  . Anemia 04/14/2014  . Protein-calorie malnutrition, severe ([facility]) 04/14/2014  . Altered mental status   . Dehydration 04/13/2014  . Altered mental state 04/13/2014  . Acute kidney injury ([facility])   . Hypokalemia 03/25/2014  . Syncope 03/16/2014  . Acute encephalopathy 03/16/2014  . Leg swelling 03/02/2014  . Avascular necrosis of bone of right hip ([facility]) 09/27/2013  . Primary osteoarthritis of right hip 09/07/2013  . Spinal stenosis, lumbar region, with neurogenic claudication 09/07/2013  . Closed fracture of pubic ramus ([facility]) 09/07/2013  . BPPV (benign paroxysmal positional vertigo) 09/07/2013  . Pharyngeal swelling 08/30/2013  . Pelvic fracture ([facility]) 08/28/2013  . Fall 08/26/2013  . Dizziness 08/26/2013  . Abnormal stress test 08/26/2013  . Preop cardiovascular exam 08/01/2013  . Aortic stenosis 08/01/2013  . Edema 06/30/2013  . Loss of weight 01/04/2013  . Situational stress 01/04/2013  . Chronic right hip pain 07/05/2012  . Knee pain, acute 06/29/2012  . Vertigo 06/24/2012  . Acute exacerbation of chronic low back pain 06/24/2012  . Lumbar pain with radiation down right leg 06/09/2012  . Osteoarthritis of hip 04/29/2012  . History of TIAs 04/17/2012  . S/P total hysterectomy and bilateral salpingo-oophorectomy 04/17/2012  . Hard of hearing 04/17/2012  . History of anemia 04/17/2012  . RLS (restless legs syndrome) 04/17/2012  . Chronic back pain 04/17/2012  . Spinal stenosis, lumbar 04/17/2012  . Lower back pain 12/20/2011  . Personal history of colonic polyps 10/08/2011  . H/O: CVA (cardiovascular accident) 09/14/2011  . Essential hypertension 09/14/2011  . Restless legs syndrome 09/14/2011  . Constipation 08/20/2011  . Urinary incontinence 08/20/2011    CMP     Component Value Date/Time   NA 136 (A) 03/19/2016    K 4.6 03/19/2016   CL 101 01/12/2016 0536   CO2 27 01/12/2016 0536   GLUCOSE 120 (H) 01/12/2016 0536   BUN 40 (A) 03/19/2016   CREATININE 1.2 (A) 03/19/2016   CREATININE 0.51 01/12/2016 0536   CREATININE 0.78 06/28/2014 1205   CALCIUM 8.3 (L) 01/12/2016 0536   PROT 7.6 01/08/2016 0412   ALBUMIN 4.4 01/08/2016 0412   AST  21 03/19/2016   ALT 12 03/19/2016   ALKPHOS 104 03/19/2016   BILITOT 0.5 01/08/2016 0412   GFRNONAA >60 01/12/2016 0536   GFRAA >60 01/12/2016 0536    Recent Labs  01/10/16 0838  01/11/16 0751  01/12/16 0536 02/04/16 03/19/16  NA 143  < > 137  < > 135 138 136*  K 3.3*  < > 3.0*  --  3.7 4.8 4.6  CL 107  --  102  --  101  --   --   CO2 27  --  26  --  27  --   --   GLUCOSE 133*  --  133*  --  120*  --   --   BUN 29*  < > 28*  < > 23* 34* 40*  CREATININE 0.71  < > 0.74  < > 0.51 1.0 1.2*  CALCIUM 8.8*  --  8.4*  --  8.3*  --   --   MG 2.0  --   --   --   --   --   --   < > = values in this interval not displayed.  Recent Labs  12/31/15 01/08/16 0412 03/19/16  AST 17 21 21   ALT 10 15 12   ALKPHOS 99 109 104  BILITOT  --  0.5  --   PROT  --  7.6  --   ALBUMIN  --  4.4  --     Recent Labs  01/08/16 0412  01/10/16 0838  01/11/16 0751 01/12/16 01/12/16 0536 03/19/16  WBC 26.4*  < > 15.1*  < > 12.6* 9.4 9.4 8.0  NEUTROABS 24.0*  --   --   --   --   --   --   --   HGB 12.2  < > 11.9*  < > 10.9*  --  10.5* 9.6*  HCT 39.2  < > 36.9  < > 33.5*  --  32.6* 30*  MCV 86.5  < > 85.2  --  85.0  --  83.8  --   PLT 336  < > 311  < > 307  --  250 278  < > = values in this interval not displayed.  Recent Labs  03/19/16  CHOL 141  LDLCALC 71  TRIG 77   No results found for: [facility] Lab Results  Component Value Date   TSH 2.09 03/19/2016   Lab Results  Component Value Date   HGBA1C 5.6 03/19/2016   Lab Results  Component Value Date   CHOL 141 03/19/2016   HDL 55 03/19/2016   LDLCALC 71 03/19/2016   TRIG 77 03/19/2016   CHOLHDL 2.8  03/06/2014    Significant Diagnostic Results in last 30 days:  No results found.  Assessment and Plan  BLE EDEMA- Reassurred pt that swelling that goes away overnight is about her legs, not her heart;encouraged her to leave room regardless; have inc lasix form 60 mg to 80 mg daily and K+ from 30 meq to 40 meq daily; have written for TED hose to remain on -thigh high- for 72 hours at a time  NAUSEA - have started protonix 40 mg daily; pt is not on any new meds or meds that should make her nauseated  SCIATICA - I think neurontin will help but would like to get rid of nausea first    Labs/tests ordered: CBC, CMP, TSH  Time spent > 35 min Malena Timpone D. [name], MD

## 2016-04-26 LAB — BASIC METABOLIC PANEL
BUN: 23 mg/dL — AB (ref 4–21)
CREATININE: 1.1 mg/dL (ref 0.5–1.1)
Glucose: 85 mg/dL
POTASSIUM: 3.8 mmol/L (ref 3.4–5.3)
SODIUM: 141 mmol/L (ref 137–147)

## 2016-04-26 LAB — CBC AND DIFFERENTIAL
HCT: 27 % — AB (ref 36–46)
HEMOGLOBIN: 8.5 g/dL — AB (ref 12.0–16.0)
Platelets: 266 10*3/uL (ref 150–399)
WBC: 6.5 10*3/mL

## 2016-04-26 LAB — TSH: TSH: 2.46 u[IU]/mL (ref 0.41–5.90)

## 2016-05-04 ENCOUNTER — Encounter: Payer: Self-pay | Admitting: Internal Medicine

## 2016-05-07 ENCOUNTER — Non-Acute Institutional Stay (SKILLED_NURSING_FACILITY): Payer: Medicare Other | Admitting: Internal Medicine

## 2016-05-07 ENCOUNTER — Encounter: Payer: Self-pay | Admitting: Internal Medicine

## 2016-05-07 DIAGNOSIS — D649 Anemia, unspecified: Secondary | ICD-10-CM

## 2016-05-07 DIAGNOSIS — M25511 Pain in right shoulder: Secondary | ICD-10-CM

## 2016-05-07 DIAGNOSIS — G8929 Other chronic pain: Secondary | ICD-10-CM | POA: Diagnosis not present

## 2016-05-07 DIAGNOSIS — M25551 Pain in right hip: Secondary | ICD-10-CM

## 2016-05-07 NOTE — Progress Notes (Signed)
Location:  [facility] Room Number: 303D Place of Service:  SNF [phone])  Shelly Delaine. [name], MD  Patient Care Team: Lanice Shirts, MD as PCP - General (Internal Medicine) [name], MD as Consulting Physician (Orthopedic Surgery) [name], MD as Consulting Physician (Otolaryngology)  Extended Emergency Contact Information Primary Emergency Contact: [name] Address: [place], [place] [address] [name] of [place] Phone: [phone] Relation: Daughter Secondary Emergency Contact: [place], [place] of [place] Mobile Phone: [phone] Relation: Son    Allergies: Amoxicillin; Baclofen; Hctz [hydrochlorothiazide]; and Valium [diazepam]  Chief Complaint  Patient presents with  . Medical Management of Chronic Issues    Routine Visit    HPI: Patient is 82 y.o. female who is being seen for routine issues of anemia, chronic R hip pain, and chronic R shoulder pain.  Past Medical History:  Diagnosis Date  . Acute encephalopathy   . AKI (acute kidney injury) ([facility])   . Altered mental state   . Anemia   . Aortic stenosis   . Arthritis    Back, knees  . Bowel incontinence   . Colon polyps   . Constipation   . Gastric ulcer    years ago  . Hypertension   . Hypokalemia   . Restless legs syndrome    on Requip  . Situational stress   . Spinal stenosis   . Stroke ([facility])    Mini, no residual  . TIA (transient ischemic attack)   . Ulcer causing bleeding and hole in wall of stomach or small intestine   . Urinary bladder calculus   . Urinary incontinence   . Vertigo     Past Surgical History:  Procedure Laterality Date  . COLON RESECTION  50 years ago  . LUMBAR LAMINECTOMY/DECOMPRESSION MICRODISCECTOMY  06/25/2011   Procedure: LUMBAR LAMINECTOMY/DECOMPRESSION MICRODISCECTOMY;  Surgeon: Hosie Spangle, MD;  Location: [facility];  Service: Neurosurgery;   Laterality: Right;  RIGHT Lumbar Two-Three hemilaminectomy and microdiskectomy  . TONSILLECTOMY    . UTERINE FIBROID SURGERY    . VAGINAL HYSTERECTOMY      Allergies as of 05/07/2016      Reactions   Amoxicillin Other (See Comments)   Reaction unknown   Baclofen Other (See Comments)   "fuzzy in the eyes" and dizzy   Hctz [hydrochlorothiazide]    nausea   Valium [diazepam] Other (See Comments)   Pt became unresponsive and O2 sats dropped.      Medication List       Accurate as of 05/07/16 11:59 PM. Always use your most recent med list.          acetaminophen 325 MG tablet Commonly known as:  TYLENOL Take 650 mg by mouth every 6 (six) hours as needed. Notify MD if not relieved. Not to exceed 3000 mg in 24 hour period.   amLODipine 5 MG tablet Commonly known as:  NORVASC Take 5 mg by mouth daily.   CHLORASEPTIC MAX SORE THROAT 1.5-33 % Liqd Generic drug:  Phenol-Glycerin Use as directed 1 spray in the mouth or throat 3 (three) times daily as needed. For throat irritation/pain. If sore throat persist notify the MD   feeding supplement Liqd Take 1 Container by mouth 2 (two) times daily between meals.   furosemide 40 MG tablet Commonly known as:  LASIX Take 40  mg by mouth every morning.   HYDROcodone-acetaminophen 7.5-325 MG tablet Commonly known as:  NORCO Take 1 tablet by mouth daily at 12 noon.   hypromellose 0.3 % Gel ophthalmic ointment Commonly known as:  GENTEAL Place 1 application into both eyes 2 (two) times daily. For dry eyes. Referral to retina specialist for evaluation.   magnesium hydroxide 400 MG/5ML suspension Commonly known as:  MILK OF MAGNESIA Take 30 mLs by mouth daily as needed for mild constipation. Reported on 07/27/2015   mirtazapine 15 MG tablet Commonly known as:  REMERON Take 7.5 mg by mouth at bedtime.   morphine 15 MG 12 hr tablet Commonly known as:  MS CONTIN Take 15 mg by mouth 2 (two) times daily.   multivitamin tablet Take 1  tablet by mouth daily.   ondansetron 4 MG tablet Commonly known as:  ZOFRAN Take 4 mg by mouth every 12 (twelve) hours as needed for nausea or vomiting.   pantoprazole 40 MG tablet Commonly known as:  PROTONIX Take 40 mg by mouth daily.   potassium chloride 10 MEQ tablet Commonly known as:  K-DUR Take 30 mEq by mouth daily. 3 caps   rOPINIRole 4 MG tablet Commonly known as:  REQUIP Take 4 mg by mouth at bedtime.   sennosides-docusate sodium 8.6-50 MG tablet Commonly known as:  SENOKOT-S Take 1 tablet by mouth 2 (two) times daily. Hold for diarrhea   Vitamin D3 50000 units Caps Take 1 capsule by mouth once a week. On Wednesday.       Meds ordered this encounter  Medications  . pantoprazole (PROTONIX) 40 MG tablet    Sig: Take 40 mg by mouth daily.     Immunization History  Administered Date(s) Administered  . Influenza Split 04/15/2012  . Influenza,inj,Quad PF,36+ Mos 03/06/2014  . Influenza-Unspecified 03/01/2016  . PPD Test 08/30/2014, 09/18/2014  . Pneumococcal Polysaccharide-23 08/29/2013    Social History  Substance Use Topics  . Smoking status: Never Smoker  . Smokeless tobacco: Never Used  . Alcohol use 0.0 oz/week     Comment: rarely    Review of Systems  DATA OBTAINED: from patient, nurse GENERAL:  no fevers, fatigue, appetite changes SKIN: No itching, rash HEENT: No complaint RESPIRATORY: No cough, wheezing, SOB CARDIAC: No chest pain, palpitations, lower extremity edema  GI: No abdominal pain, No N/V/D or constipation, No heartburn or reflux  GU: No dysuria, frequency or urgency, or incontinence  MUSCULOSKELETAL: No unrelieved bone/joint pain NEUROLOGIC: No headache, dizziness  PSYCHIATRIC: No overt anxiety or sadness  Vitals:   05/07/16 0906  BP: (!) 100/58  Pulse: 74  Resp: 18  Temp: 98.6 F (37 C)   Body mass index is 19.73 kg/m. Physical Exam  GENERAL APPEARANCE: Alert, conversant, No acute distress  SKIN: No diaphoresis  rash HEENT: Unremarkable RESPIRATORY: Breathing is even, unlabored. Lung sounds are clear   CARDIOVASCULAR: Heart RRR no murmurs, rubs or gallops. + peripheral edema slt improved from prior GASTROINTESTINAL: Abdomen is soft, non-tender, not distended w/ normal bowel sounds.  GENITOURINARY: Bladder non tender, not distended  MUSCULOSKELETAL: No abnormal joints or musculature NEUROLOGIC: Cranial nerves 2-12 grossly intact. Moves all extremities PSYCHIATRIC: Mood and affect appropriate to situation, no behavioral issues  Patient Active Problem List   Diagnosis Date Noted  . Chronic right shoulder pain 02/09/2016  . Bilateral lower extremity edema 02/02/2016  . Ileitis 01/19/2016  . Pressure ulcer 01/09/2016  . SBO (small bowel obstruction) 01/08/2016  . Narcotic addiction ([place]) 05/15/2015  .  Diarrhea 03/30/2015  . Acute pharyngitis 02/26/2015  . Osteoarthritis of right knee 02/22/2015  . Pain in joint, lower leg 10/31/2014  . Ulcer of sacral region, stage 4 ([place]) 04/15/2014  . Moderate aortic stenosis 04/14/2014  . Pre-syncope 04/14/2014  . Hypotension 04/14/2014  . Acute on chronic renal failure ([place]) 04/14/2014  . Anemia 04/14/2014  . Protein-calorie malnutrition, severe ([place]) 04/14/2014  . Altered mental status   . Dehydration 04/13/2014  . Altered mental state 04/13/2014  . Acute kidney injury ([place])   . Hypokalemia 03/25/2014  . Syncope 03/16/2014  . Acute encephalopathy 03/16/2014  . Leg swelling 03/02/2014  . Avascular necrosis of bone of right hip ([place]) 09/27/2013  . Primary osteoarthritis of right hip 09/07/2013  . Spinal stenosis, lumbar region, with neurogenic claudication 09/07/2013  . Closed fracture of pubic ramus ([place]) 09/07/2013  . BPPV (benign paroxysmal positional vertigo) 09/07/2013  . Pharyngeal swelling 08/30/2013  . Pelvic fracture ([place]) 08/28/2013  . Fall 08/26/2013  . Dizziness 08/26/2013  . Abnormal stress test 08/26/2013  . Preop cardiovascular  exam 08/01/2013  . Aortic stenosis 08/01/2013  . Edema 06/30/2013  . Loss of weight 01/04/2013  . Situational stress 01/04/2013  . Chronic right hip pain 07/05/2012  . Knee pain, acute 06/29/2012  . Vertigo 06/24/2012  . Acute exacerbation of chronic low back pain 06/24/2012  . Lumbar pain with radiation down right leg 06/09/2012  . Osteoarthritis of hip 04/29/2012  . History of TIAs 04/17/2012  . S/P total hysterectomy and bilateral salpingo-oophorectomy 04/17/2012  . Hard of hearing 04/17/2012  . History of anemia 04/17/2012  . RLS (restless legs syndrome) 04/17/2012  . Chronic back pain 04/17/2012  . Spinal stenosis, lumbar 04/17/2012  . Lower back pain 12/20/2011  . Personal history of colonic polyps 10/08/2011  . H/O: CVA (cardiovascular accident) 09/14/2011  . Essential hypertension 09/14/2011  . Restless legs syndrome 09/14/2011  . Constipation 08/20/2011  . Urinary incontinence 08/20/2011    CMP     Component Value Date/Time   NA 136 (A) 03/19/2016   K 4.6 03/19/2016   CL 101 01/12/2016 0536   CO2 27 01/12/2016 0536   GLUCOSE 120 (H) 01/12/2016 0536   BUN 40 (A) 03/19/2016   CREATININE 1.2 (A) 03/19/2016   CREATININE 0.51 01/12/2016 0536   CREATININE 0.78 06/28/2014 1205   CALCIUM 8.3 (L) 01/12/2016 0536   PROT 7.6 01/08/2016 0412   ALBUMIN 4.4 01/08/2016 0412   AST 21 03/19/2016   ALT 12 03/19/2016   ALKPHOS 104 03/19/2016   BILITOT 0.5 01/08/2016 0412   GFRNONAA >60 01/12/2016 0536   GFRAA >60 01/12/2016 0536    Recent Labs  01/10/16 0838  01/11/16 0751  01/12/16 0536 02/04/16 03/19/16  NA 143  < > 137  < > 135 138 136*  K 3.3*  < > 3.0*  --  3.7 4.8 4.6  CL 107  --  102  --  101  --   --   CO2 27  --  26  --  27  --   --   GLUCOSE 133*  --  133*  --  120*  --   --   BUN 29*  < > 28*  < > 23* 34* 40*  CREATININE 0.71  < > 0.74  < > 0.51 1.0 1.2*  CALCIUM 8.8*  --  8.4*  --  8.3*  --   --   MG 2.0  --   --   --   --   --   --   < > =  values in  this interval not displayed.  Recent Labs  12/31/15 01/08/16 0412 03/19/16  AST 17 21 21   ALT 10 15 12   ALKPHOS 99 109 104  BILITOT  --  0.5  --   PROT  --  7.6  --   ALBUMIN  --  4.4  --     Recent Labs  01/08/16 0412  01/10/16 0838  01/11/16 0751 01/12/16 01/12/16 0536 03/19/16  WBC 26.4*  < > 15.1*  < > 12.6* 9.4 9.4 8.0  NEUTROABS 24.0*  --   --   --   --   --   --   --   HGB 12.2  < > 11.9*  < > 10.9*  --  10.5* 9.6*  HCT 39.2  < > 36.9  < > 33.5*  --  32.6* 30*  MCV 86.5  < > 85.2  --  85.0  --  83.8  --   PLT 336  < > 311  < > 307  --  250 278  < > = values in this interval not displayed.  Recent Labs  03/19/16  CHOL 141  LDLCALC 71  TRIG 77   No results found for: [facility] Lab Results  Component Value Date   TSH 2.09 03/19/2016   Lab Results  Component Value Date   HGBA1C 5.6 03/19/2016   Lab Results  Component Value Date   CHOL 141 03/19/2016   HDL 55 03/19/2016   LDLCALC 71 03/19/2016   TRIG 77 03/19/2016   CHOLHDL 2.8 03/06/2014    Significant Diagnostic Results in last 30 days:  No results found.  Assessment and Plan  Anemia Most recent 9.6, a slt drop from prior; will cont to monitor at intervals  Chronic right hip pain Continues with hip pain ; rarely complains ;is followed by pain clinic and is on much less meds than prior, norco 5 mg 5X dai;y prn.;plan for pt to cont with pain clinic  Chronic right shoulder pain Same as hip. Chronic, does seem to respond to physical therapy; is followed by pain clinic on norco 5mg  up to 5X daily prn;plan to cont pain clinic rsc     Deondrea Aguado D. [name], MD

## 2016-05-28 IMAGING — CT CT HEAD W/O CM
1 series · 16 of 30 positions shown, 20 images · non-contrast
Comparison: CT head 08/26/2013.

CLINICAL DATA: Altered mental status. Increased confusion.
Disorientation.

EXAM:
CT HEAD WITHOUT CONTRAST
TECHNIQUE: Contiguous axial images were obtained from the base of the skull
through the vertex without contrast.

[Series 2: head 5.0 h30s · axial · 0.44mm/px · z∈[-122,+13]mm · 16 of 31 slices shown, 20 images]
[im 2/31  brain]
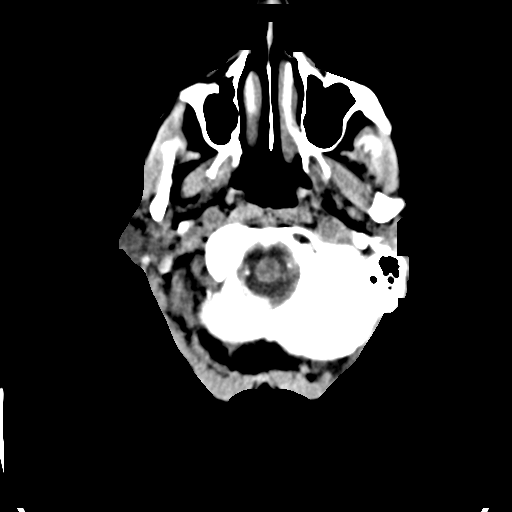
[im 2/31  bone]
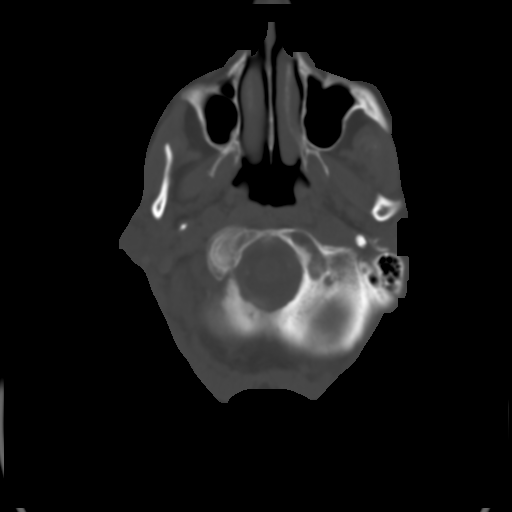
[im 4/31  brain]
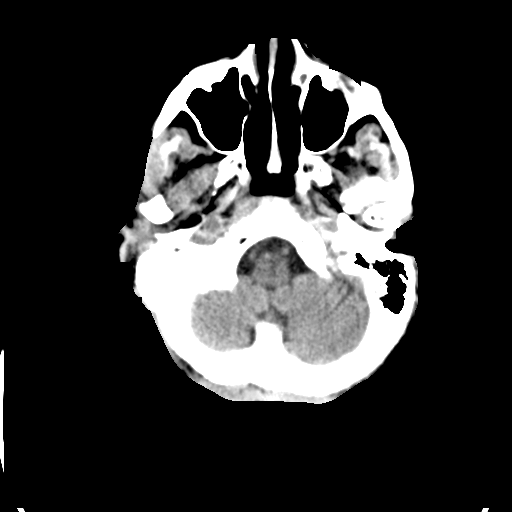
[im 6/31  brain]
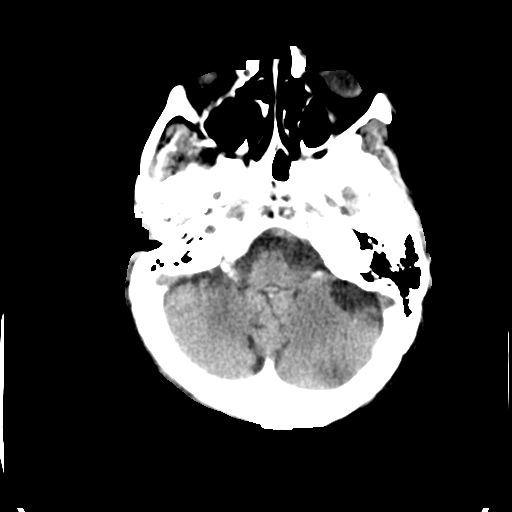
[im 8/31  brain]
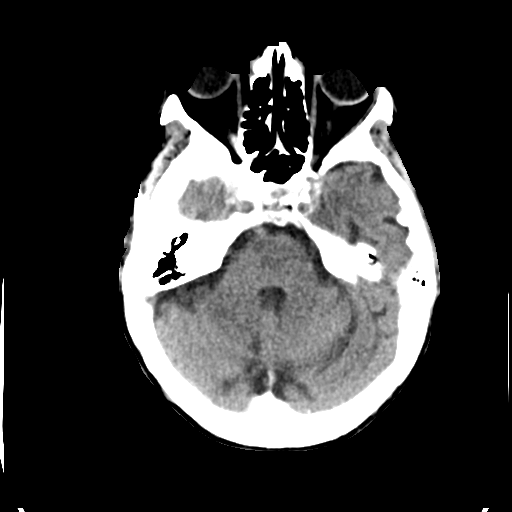
[im 9/31  brain]
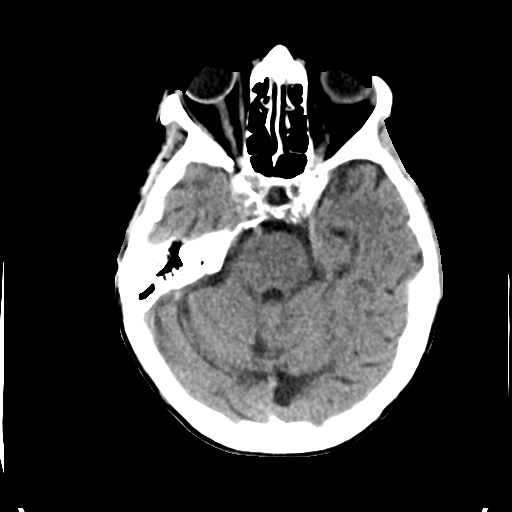
[im 9/31  bone]
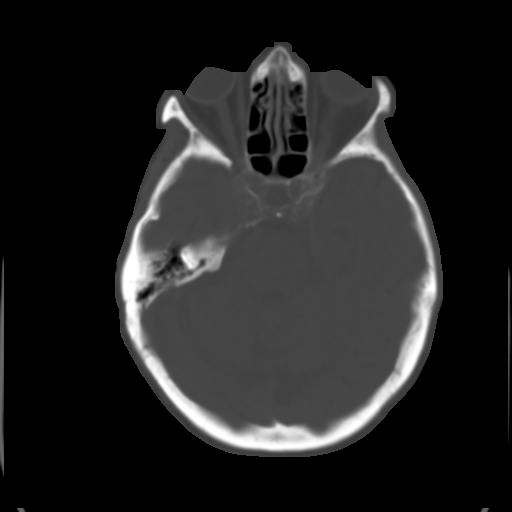
[im 11/31  brain]
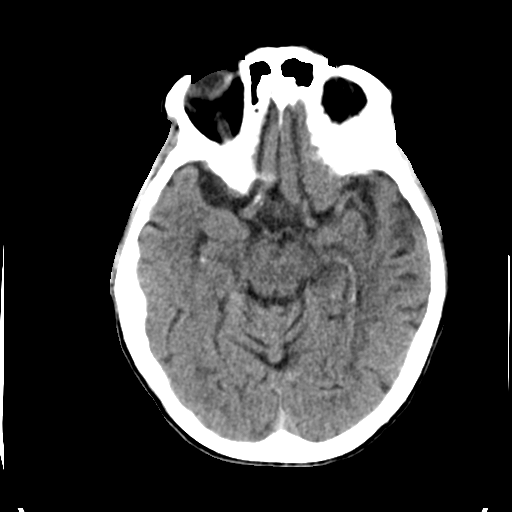
[im 13/31  brain]
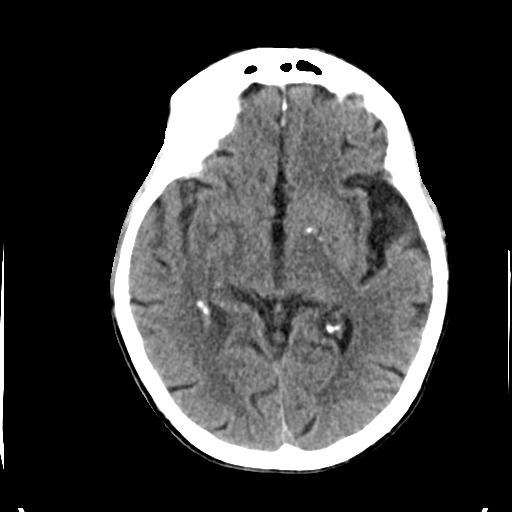
[im 15/31  brain]
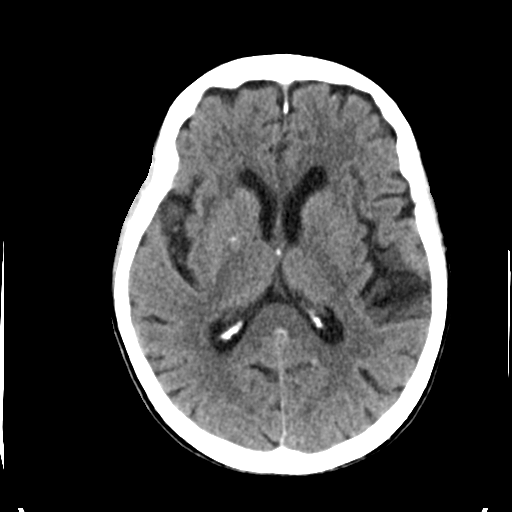
[im 16/31  brain]
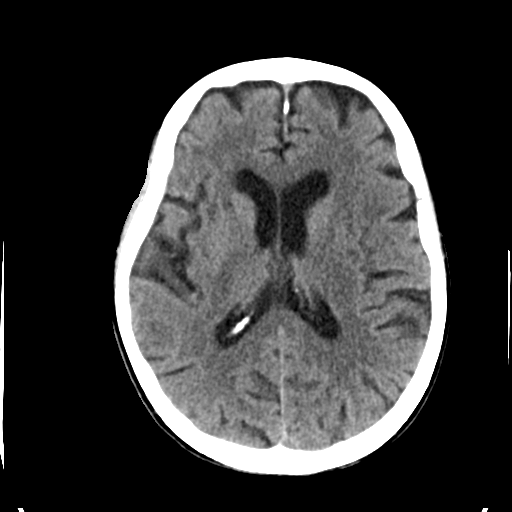
[im 16/31  bone]
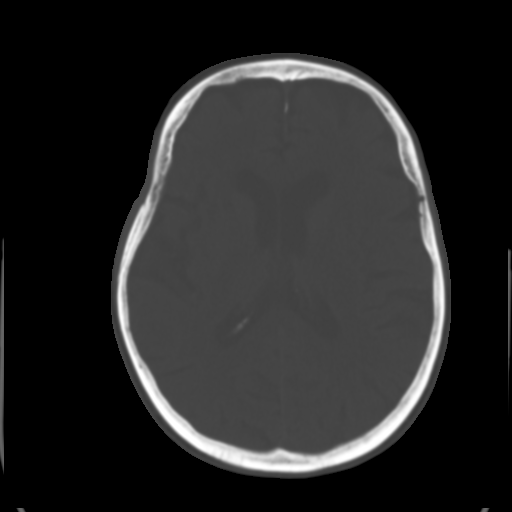
[im 18/31  brain]
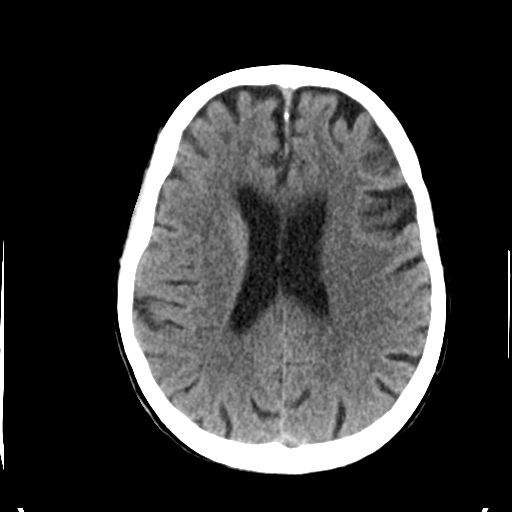
[im 20/31  brain]
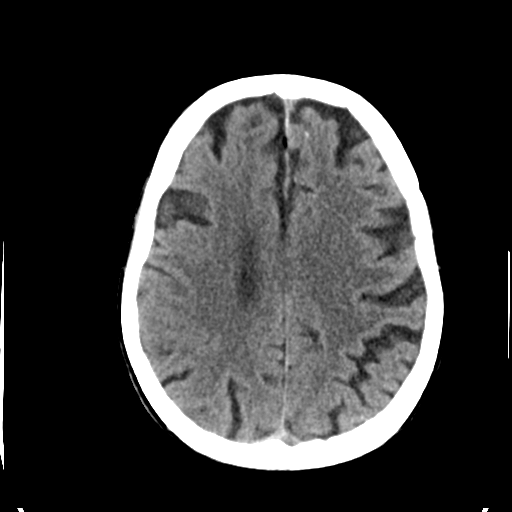
[im 22/31  brain]
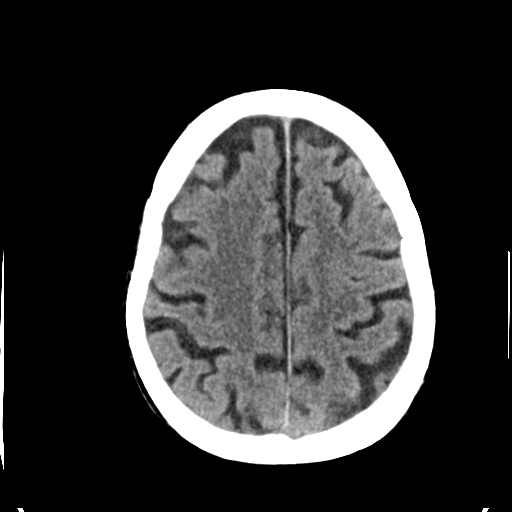
[im 23/31  brain]
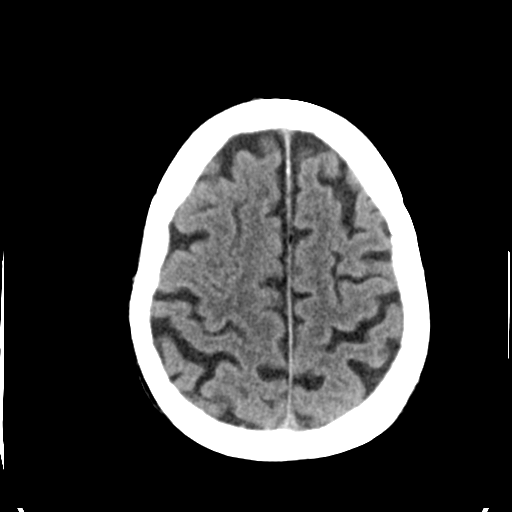
[im 23/31  bone]
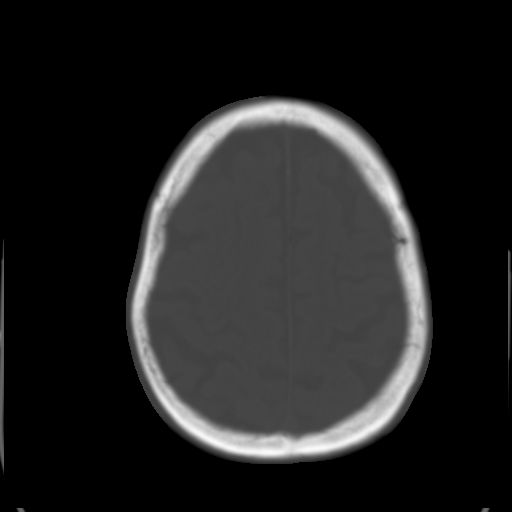
[im 25/31  brain]
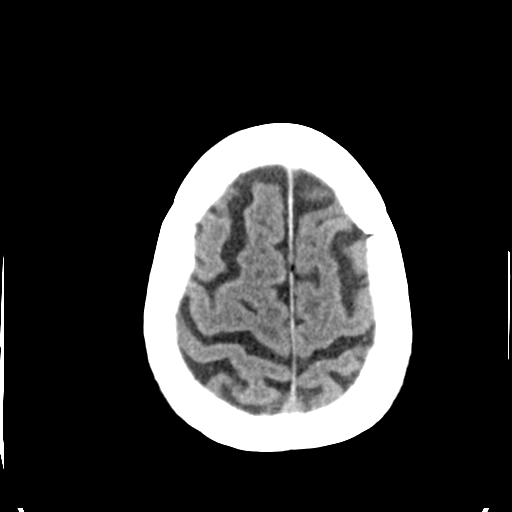
[im 27/31  brain]
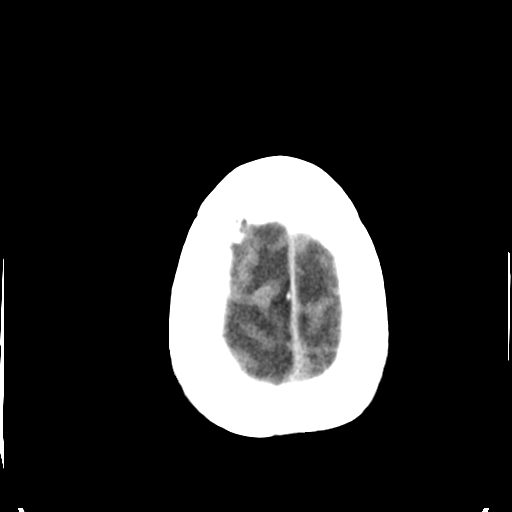
[im 29/31  brain]
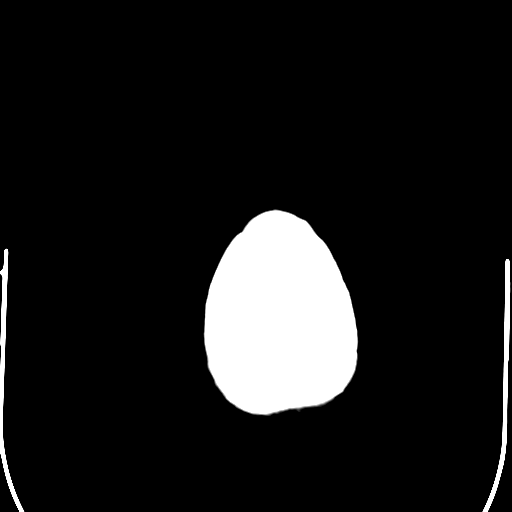

[16 of 30 positions shown; findings below may reference images not displayed]

FINDINGS: Generalized atrophy with chronic microvascular ischemic change
persists. There is no acute stroke or hemorrhage. A 11 mm LEFT
anterior falx meningioma is redemonstrated and stable. Calvarium is
intact. There is mild chronic sinus disease. No mastoid fluid.
Vascular calcification is noted.
IMPRESSION: Stable exam.  No acute intracranial findings.

## 2016-05-31 ENCOUNTER — Encounter: Payer: Self-pay | Admitting: Internal Medicine

## 2016-05-31 NOTE — Assessment & Plan Note (Signed)
Same as hip. Chronic, does seem to respond to physical therapy; is followed by pain clinic on norco 5mg  up to 5X daily prn;plan to cont pain clinic rsc

## 2016-05-31 NOTE — Assessment & Plan Note (Signed)
Most recent 9.6, a slt drop from prior; will cont to monitor at intervals

## 2016-05-31 NOTE — Assessment & Plan Note (Signed)
Continues with hip pain ; rarely complains ;is followed by pain clinic and is on much less meds than prior, norco 5 mg 5X dai;y prn.;plan for pt to cont with pain clinic

## 2016-06-04 LAB — BASIC METABOLIC PANEL
BUN: 34 mg/dL — AB (ref 4–21)
Creatinine: 1.3 mg/dL — AB (ref 0.5–1.1)
Glucose: 91 mg/dL
Potassium: 4.4 mmol/L (ref 3.4–5.3)
SODIUM: 140 mmol/L (ref 137–147)

## 2016-06-04 LAB — HEPATIC FUNCTION PANEL
ALK PHOS: 109 U/L (ref 25–125)
ALT: 6 U/L — AB (ref 7–35)
AST: 16 U/L (ref 13–35)
BILIRUBIN, TOTAL: 0.2 mg/dL

## 2016-06-09 ENCOUNTER — Non-Acute Institutional Stay (SKILLED_NURSING_FACILITY): Payer: Medicare Other | Admitting: Internal Medicine

## 2016-06-09 ENCOUNTER — Encounter: Payer: Self-pay | Admitting: Internal Medicine

## 2016-06-09 DIAGNOSIS — R600 Localized edema: Secondary | ICD-10-CM | POA: Diagnosis not present

## 2016-06-09 DIAGNOSIS — F329 Major depressive disorder, single episode, unspecified: Secondary | ICD-10-CM

## 2016-06-09 DIAGNOSIS — M62838 Other muscle spasm: Secondary | ICD-10-CM

## 2016-06-09 DIAGNOSIS — F32A Depression, unspecified: Secondary | ICD-10-CM

## 2016-06-09 NOTE — Progress Notes (Signed)
Location:  [facility] Room Number: 303D Place of Service:  SNF [phone])  Shelly Martin. Shelly Coil, MD  Patient Care Team: Lanice Shirts, MD as PCP - General (Internal Medicine) [name], MD as Consulting Physician (Orthopedic Surgery) [name], MD as Consulting Physician (Otolaryngology)  Extended Emergency Contact Information Primary Emergency Contact: Shelly Martin Address: [place], [place] [address] Shelly Martin of [place] Phone: [phone] Relation: Daughter Secondary Emergency Contact: [place], Shelly Martin Mobile Phone: [phone] Relation: Son    Allergies: Amoxicillin; Baclofen; Hctz [hydrochlorothiazide]; and Valium [diazepam]  Chief Complaint  Patient presents with  . Acute Visit    Acute     HPI: Patient is 83 y.o. female who is 83 years old today! Who has several issues that she wishes to discuss with me.  She does this not infrequently, she gets anxious, starts to worry. She has a pain behind her R ear, she wants to know if she is OK, wants to know how she is doing overall.  Past Medical History:  Diagnosis Date  . Acute encephalopathy   . AKI (acute kidney injury) ([place])   . Altered mental state   . Anemia   . Aortic stenosis   . Arthritis    Back, knees  . Bowel incontinence   . Colon polyps   . Constipation   . Gastric ulcer    years ago  . Hypertension   . Hypokalemia   . Restless legs syndrome    on Requip  . Situational stress   . Spinal stenosis   . Stroke ([place])    Mini, no residual  . TIA (transient ischemic attack)   . Ulcer causing bleeding and hole in wall of stomach or small intestine   . Urinary bladder calculus   . Urinary incontinence   . Vertigo     Past Surgical History:  Procedure Laterality Date  . COLON RESECTION  50 years ago  . LUMBAR LAMINECTOMY/DECOMPRESSION MICRODISCECTOMY  06/25/2011   Procedure:  LUMBAR LAMINECTOMY/DECOMPRESSION MICRODISCECTOMY;  Surgeon: Hosie Spangle, MD;  Location: [facility];  Service: Neurosurgery;  Laterality: Right;  RIGHT Lumbar Two-Three hemilaminectomy and microdiskectomy  . TONSILLECTOMY    . UTERINE FIBROID SURGERY    . VAGINAL HYSTERECTOMY      Allergies as of 06/09/2016      Reactions   Amoxicillin Other (See Comments)   Reaction unknown   Baclofen Other (See Comments)   "fuzzy in the eyes" and dizzy   Hctz [hydrochlorothiazide]    nausea   Valium [diazepam] Other (See Comments)   Pt became unresponsive and O2 sats dropped.      Medication List       Accurate as of 06/09/16  1:26 PM. Always use your most recent med list.          amLODipine 5 MG tablet Commonly known as:  NORVASC Take 5 mg by mouth daily.   CHLORASEPTIC MAX SORE THROAT 1.5-33 % Liqd Generic drug:  Phenol-Glycerin Use as directed 1 spray in the mouth or throat 3 (three) times daily as needed. For throat irritation/pain. If sore throat persist notify the MD   feeding supplement Liqd Take 1 Container by mouth 2 (two) times daily between meals.   furosemide 80 MG tablet Commonly known as:  LASIX Take 80 mg by mouth  every morning.   lubiprostone 24 MCG capsule Commonly known as:  AMITIZA Take 24 mcg by mouth 2 (two) times daily with a meal.   magnesium hydroxide 400 MG/5ML suspension Commonly known as:  MILK OF MAGNESIA Take 30 mLs by mouth daily as needed for mild constipation. Reported on 07/27/2015   mirtazapine 15 MG tablet Commonly known as:  REMERON Take 7.5 mg by mouth at bedtime.   morphine 15 MG 12 hr tablet Commonly known as:  MS CONTIN Take 15 mg by mouth 2 (two) times daily.   multivitamin tablet Take 1 tablet by mouth daily.   ondansetron 4 MG tablet Commonly known as:  ZOFRAN Take 4 mg by mouth every 12 (twelve) hours as needed for nausea or vomiting.   pantoprazole 40 MG tablet Commonly known as:  PROTONIX Take 40 mg by mouth daily.    potassium chloride 20 MEQ packet Commonly known as:  KLOR-CON Take 20 mEq by mouth 2 (two) times daily.   rOPINIRole 4 MG tablet Commonly known as:  REQUIP Take 4 mg by mouth at bedtime.   SYSTANE BALANCE 0.6 % Soln Generic drug:  Propylene Glycol Place 1 drop into both eyes 2 (two) times daily.   Vitamin D3 50000 units Caps Take 1 capsule by mouth once a week. On Wednesday.       No orders of the defined types were placed in this encounter.   Immunization History  Administered Date(s) Administered  . Influenza Split 04/15/2012  . Influenza,inj,Quad PF,36+ Mos 03/06/2014  . Influenza-Unspecified 03/01/2016  . PPD Test 08/30/2014, 09/18/2014  . Pneumococcal Polysaccharide-23 08/29/2013    Social History  Substance Use Topics  . Smoking status: Never Smoker  . Smokeless tobacco: Never Used  . Alcohol use 0.0 oz/week     Comment: rarely    Review of Systems  DATA OBTAINED: from patient GENERAL:  no fevers, fatigue, appetite changes SKIN: No itching, rash HEENT: pain behind R hear/nack for several days RESPIRATORY: No cough, wheezing, SOB CARDIAC: No chest pain, palpitations, lower extremity edema is better GI: No abdominal pain, No N/V/D or constipation, No heartburn or reflux  GU: No dysuria, frequency or urgency, or incontinence  MUSCULOSKELETAL: No unrelieved bone/joint pain NEUROLOGIC: No headache, dizziness  PSYCHIATRIC: No overt anxiety or sadness  Vitals:   06/09/16 1324  BP: (!) 100/58  Pulse: 88  Resp: 17  Temp: 98.6 F (37 C)   Body mass index is 20.08 kg/m. Physical Exam  GENERAL APPEARANCE: Alert, conversant, No acute distress  SKIN: No diaphoresis rash HEENT: Unremarkable NECK : R suboccipital neck with small  tight tender muscle spasm RESPIRATORY: Breathing is even, unlabored. Lung sounds are clear   CARDIOVASCULAR: Heart RRR no murmurs, rubs or gallops. 1+ peripheral edema , improved from prior GASTROINTESTINAL: Abdomen is soft,  non-tender, not distended w/ normal bowel sounds.  GENITOURINARY: Bladder non tender, not distended  MUSCULOSKELETAL: No abnormal joints or musculature NEUROLOGIC: Cranial nerves 2-12 grossly intact. Moves all extremities PSYCHIATRIC: Mood and affect appropriate to situation, no behavioral issues  Patient Active Problem List   Diagnosis Date Noted  . Chronic right shoulder pain 02/09/2016  . Bilateral lower extremity edema 02/02/2016  . Ileitis 01/19/2016  . Pressure ulcer 01/09/2016  . SBO (small bowel obstruction) 01/08/2016  . Narcotic addiction ([place]) 05/15/2015  . Diarrhea 03/30/2015  . Acute pharyngitis 02/26/2015  . Osteoarthritis of right knee 02/22/2015  . Pain in joint, lower leg 10/31/2014  . Ulcer of sacral region, stage  4 ([place]) 04/15/2014  . Moderate aortic stenosis 04/14/2014  . Pre-syncope 04/14/2014  . Hypotension 04/14/2014  . Acute on chronic renal failure ([place]) 04/14/2014  . Anemia 04/14/2014  . Protein-calorie malnutrition, severe ([place]) 04/14/2014  . Altered mental status   . Dehydration 04/13/2014  . Altered mental state 04/13/2014  . Acute kidney injury ([place])   . Hypokalemia 03/25/2014  . Syncope 03/16/2014  . Acute encephalopathy 03/16/2014  . Leg swelling 03/02/2014  . Avascular necrosis of bone of right hip ([place]) 09/27/2013  . Primary osteoarthritis of right hip 09/07/2013  . Spinal stenosis, lumbar region, with neurogenic claudication 09/07/2013  . Closed fracture of pubic ramus ([place]) 09/07/2013  . BPPV (benign paroxysmal positional vertigo) 09/07/2013  . Pharyngeal swelling 08/30/2013  . Pelvic fracture ([place]) 08/28/2013  . Fall 08/26/2013  . Dizziness 08/26/2013  . Abnormal stress test 08/26/2013  . Preop cardiovascular exam 08/01/2013  . Aortic stenosis 08/01/2013  . Edema 06/30/2013  . Loss of weight 01/04/2013  . Situational stress 01/04/2013  . Chronic right hip pain 07/05/2012  . Knee pain, acute 06/29/2012  . Vertigo 06/24/2012  .  Acute exacerbation of chronic low back pain 06/24/2012  . Lumbar pain with radiation down right leg 06/09/2012  . Osteoarthritis of hip 04/29/2012  . History of TIAs 04/17/2012  . S/P total hysterectomy and bilateral salpingo-oophorectomy 04/17/2012  . Hard of hearing 04/17/2012  . History of anemia 04/17/2012  . RLS (restless legs syndrome) 04/17/2012  . Chronic back pain 04/17/2012  . Spinal stenosis, lumbar 04/17/2012  . Lower back pain 12/20/2011  . Personal history of colonic polyps 10/08/2011  . H/O: CVA (cardiovascular accident) 09/14/2011  . Essential hypertension 09/14/2011  . Restless legs syndrome 09/14/2011  . Constipation 08/20/2011  . Urinary incontinence 08/20/2011    CMP     Component Value Date/Time   NA 136 (A) 03/19/2016   K 4.6 03/19/2016   CL 101 01/12/2016 0536   CO2 27 01/12/2016 0536   GLUCOSE 120 (H) 01/12/2016 0536   BUN 40 (A) 03/19/2016   CREATININE 1.2 (A) 03/19/2016   CREATININE 0.51 01/12/2016 0536   CREATININE 0.78 06/28/2014 1205   CALCIUM 8.3 (L) 01/12/2016 0536   PROT 7.6 01/08/2016 0412   ALBUMIN 4.4 01/08/2016 0412   AST 21 03/19/2016   ALT 12 03/19/2016   ALKPHOS 104 03/19/2016   BILITOT 0.5 01/08/2016 0412   GFRNONAA >60 01/12/2016 0536   GFRAA >60 01/12/2016 0536    Recent Labs  01/10/16 0838  01/11/16 0751  01/12/16 0536 02/04/16 03/19/16  NA 143  < > 137  < > 135 138 136*  K 3.3*  < > 3.0*  --  3.7 4.8 4.6  CL 107  --  102  --  101  --   --   CO2 27  --  26  --  27  --   --   GLUCOSE 133*  --  133*  --  120*  --   --   BUN 29*  < > 28*  < > 23* 34* 40*  CREATININE 0.71  < > 0.74  < > 0.51 1.0 1.2*  CALCIUM 8.8*  --  8.4*  --  8.3*  --   --   MG 2.0  --   --   --   --   --   --   < > = values in this interval not displayed.  Recent Labs  12/31/15 01/08/16 0412 03/19/16  AST  17 21 21   ALT 10 15 12   ALKPHOS 99 109 104  BILITOT  --  0.5  --   PROT  --  7.6  --   ALBUMIN  --  4.4  --     Recent Labs   01/08/16 0412  01/10/16 0838  01/11/16 0751 01/12/16 01/12/16 0536 03/19/16  WBC 26.4*  < > 15.1*  < > 12.6* 9.4 9.4 8.0  NEUTROABS 24.0*  --   --   --   --   --   --   --   HGB 12.2  < > 11.9*  < > 10.9*  --  10.5* 9.6*  HCT 39.2  < > 36.9  < > 33.5*  --  32.6* 30*  MCV 86.5  < > 85.2  --  85.0  --  83.8  --   PLT 336  < > 311  < > 307  --  250 278  < > = values in this interval not displayed.  Recent Labs  03/19/16  CHOL 141  LDLCALC 71  TRIG 77   No results found for: [facility] Lab Results  Component Value Date   TSH 2.09 03/19/2016   Lab Results  Component Value Date   HGBA1C 5.6 03/19/2016   Lab Results  Component Value Date   CHOL 141 03/19/2016   HDL 55 03/19/2016   LDLCALC 71 03/19/2016   TRIG 77 03/19/2016   CHOLHDL 2.8 03/06/2014    Significant Diagnostic Results in last 30 days:  No results found.  Assessment and Plan  DEPRESSION - I spoke with pt at some length about multiple issues. She is not getting out of her room as much as she used to. I'm going to increase her remeron to 15 mg a depression dose; encouraged he rto get out of her room more.  NECK MUSCLE SPASM - have written for flrxeril 5 mg qHS prn  LE EDEMA - IMPROVED WITH DEMADEX; gave pt permission to take her hose off every night since he swelling is so much better   Time spent > 35 min Fletcher Ostermiller D. Shelly Coil, MD

## 2016-06-13 ENCOUNTER — Non-Acute Institutional Stay (SKILLED_NURSING_FACILITY): Payer: Medicare Other | Admitting: Internal Medicine

## 2016-06-13 ENCOUNTER — Encounter: Payer: Self-pay | Admitting: Internal Medicine

## 2016-06-13 DIAGNOSIS — K5901 Slow transit constipation: Secondary | ICD-10-CM | POA: Diagnosis not present

## 2016-06-13 DIAGNOSIS — I10 Essential (primary) hypertension: Secondary | ICD-10-CM

## 2016-06-13 DIAGNOSIS — E876 Hypokalemia: Secondary | ICD-10-CM

## 2016-06-13 LAB — BASIC METABOLIC PANEL
BUN: 36 mg/dL — AB (ref 4–21)
Creatinine: 1.4 mg/dL — AB (ref 0.5–1.1)
Glucose: 188 mg/dL
POTASSIUM: 4.1 mmol/L (ref 3.4–5.3)
SODIUM: 138 mmol/L (ref 137–147)

## 2016-06-13 NOTE — Progress Notes (Signed)
Location:  [facility] Room Number: 303D Place of Service:  SNF [phone]) Shelly Martin. [name], MD    Patient Care Team: Lanice Shirts, MD as PCP - General (Internal Medicine) [name], MD as Consulting Physician (Orthopedic Surgery) [name], MD as Consulting Physician (Otolaryngology)  Extended Emergency Contact Information Primary Emergency Contact: [name] Address: [place], [place] [address] [name] of [place] Phone: [phone] Relation: Daughter Secondary Emergency Contact: [place], [place] of [place] Mobile Phone: [phone] Relation: Son    Allergies: Amoxicillin; Baclofen; Hctz [hydrochlorothiazide]; and Valium [diazepam]  Chief Complaint  Patient presents with  . Medical Management of Chronic Issues    Routine Visit    HPI: Patient is 83 y.o. female who is being seen for routine issues of HTN, constipation and hypokalemia.  Past Medical History:  Diagnosis Date  . Acute encephalopathy   . AKI (acute kidney injury) ([place])   . Altered mental state   . Anemia   . Aortic stenosis   . Arthritis    Back, knees  . Bowel incontinence   . Colon polyps   . Constipation   . Gastric ulcer    years ago  . Hypertension   . Hypokalemia   . Restless legs syndrome    on Requip  . Situational stress   . Spinal stenosis   . Stroke ([place])    Mini, no residual  . TIA (transient ischemic attack)   . Ulcer causing bleeding and hole in wall of stomach or small intestine   . Urinary bladder calculus   . Urinary incontinence   . Vertigo     Past Surgical History:  Procedure Laterality Date  . COLON RESECTION  50 years ago  . LUMBAR LAMINECTOMY/DECOMPRESSION MICRODISCECTOMY  06/25/2011   Procedure: LUMBAR LAMINECTOMY/DECOMPRESSION MICRODISCECTOMY;  Surgeon: Hosie Spangle, MD;  Location: [facility];  Service: Neurosurgery;  Laterality: Right;   RIGHT Lumbar Two-Three hemilaminectomy and microdiskectomy  . TONSILLECTOMY    . UTERINE FIBROID SURGERY    . VAGINAL HYSTERECTOMY      Allergies as of 06/13/2016      Reactions   Amoxicillin Other (See Comments)   Reaction unknown   Baclofen Other (See Comments)   "fuzzy in the eyes" and dizzy   Hctz [hydrochlorothiazide]    nausea   Valium [diazepam] Other (See Comments)   Pt became unresponsive and O2 sats dropped.      Medication List       Accurate as of 06/13/16 11:59 PM. Always use your most recent med list.          amLODipine 5 MG tablet Commonly known as:  NORVASC Take 5 mg by mouth daily.   CHLORASEPTIC MAX SORE THROAT 1.5-33 % Liqd Generic drug:  Phenol-Glycerin Use as directed 1 spray in the mouth or throat 3 (three) times daily as needed. For throat irritation/pain. If sore throat persist notify the MD   feeding supplement Liqd Take 1 Container by mouth 2 (two) times daily between meals.   furosemide 80 MG tablet Commonly known as:  LASIX Take 80 mg by mouth every morning.   lubiprostone 24 MCG capsule Commonly known as:  AMITIZA Take 24 mcg by mouth 2 (two) times daily with a meal.   magnesium hydroxide 400 MG/5ML suspension Commonly known as:  MILK OF  MAGNESIA Take 30 mLs by mouth daily as needed for mild constipation. Reported on 07/27/2015   mirtazapine 15 MG tablet Commonly known as:  REMERON Take 7.5 mg by mouth at bedtime.   morphine 15 MG 12 hr tablet Commonly known as:  MS CONTIN Take 15 mg by mouth 2 (two) times daily.   multivitamin tablet Take 1 tablet by mouth daily.   ondansetron 4 MG tablet Commonly known as:  ZOFRAN Take 4 mg by mouth every 12 (twelve) hours as needed for nausea or vomiting.   pantoprazole 40 MG tablet Commonly known as:  PROTONIX Take 40 mg by mouth daily.   potassium chloride 20 MEQ packet Commonly known as:  KLOR-CON Take 20 mEq by mouth 2 (two) times daily.   rOPINIRole 4 MG tablet Commonly known  as:  REQUIP Take 4 mg by mouth at bedtime.   SYSTANE BALANCE 0.6 % Soln Generic drug:  Propylene Glycol Place 1 drop into both eyes 2 (two) times daily.       No orders of the defined types were placed in this encounter.   Immunization History  Administered Date(s) Administered  . Influenza Split 04/15/2012  . Influenza,inj,Quad PF,36+ Mos 03/06/2014  . Influenza-Unspecified 03/01/2016  . PPD Test 08/30/2014, 09/18/2014  . Pneumococcal Polysaccharide-23 08/29/2013    Social History  Substance Use Topics  . Smoking status: Never Smoker  . Smokeless tobacco: Never Used  . Alcohol use 0.0 oz/week     Comment: rarely    Review of Systems  DATA OBTAINED: from patient GENERAL:  no fevers, fatigue, appetite changes SKIN: No itching, rash HEENT: No complaint RESPIRATORY: No cough, wheezing, SOB CARDIAC: No chest pain, palpitations, lower extremity edema is better  GI: No abdominal pain, No N/V/D or constipation, No heartburn or reflux  GU: No dysuria, frequency or urgency, or incontinence  MUSCULOSKELETAL: No unrelieved bone/joint pain NEUROLOGIC: No headache, dizziness  PSYCHIATRIC: No overt anxiety or sadness  Vitals:   06/13/16 1250  BP: (!) 100/58  Pulse: 86  Resp: 17  Temp: 98.6 F (37 C)   Body mass index is 19.92 kg/m. Physical Exam  GENERAL APPEARANCE: Alert, conversant, No acute distress  SKIN: No diaphoresis rash HEENT: Unremarkable RESPIRATORY: Breathing is even, unlabored. Lung sounds are clear   CARDIOVASCULAR: Heart RRR no murmurs, rubs or gallops. 1+ peripheral edema , improved GASTROINTESTINAL: Abdomen is soft, non-tender, not distended w/ normal bowel sounds.  GENITOURINARY: Bladder non tender, not distended  MUSCULOSKELETAL: No abnormal joints or musculature NEUROLOGIC: Cranial nerves 2-12 grossly intact. Moves all extremities PSYCHIATRIC: a little generalized anxiety, better after we spoke, no behavioral issues  Patient Active Problem List    Diagnosis Date Noted  . Chronic right shoulder pain 02/09/2016  . Bilateral lower extremity edema 02/02/2016  . Ileitis 01/19/2016  . Pressure ulcer 01/09/2016  . SBO (small bowel obstruction) 01/08/2016  . Narcotic addiction ([place]) 05/15/2015  . Diarrhea 03/30/2015  . Acute pharyngitis 02/26/2015  . Osteoarthritis of right knee 02/22/2015  . Pain in joint, lower leg 10/31/2014  . Ulcer of sacral region, stage 4 ([place]) 04/15/2014  . Moderate aortic stenosis 04/14/2014  . Pre-syncope 04/14/2014  . Hypotension 04/14/2014  . Acute on chronic renal failure ([place]) 04/14/2014  . Anemia 04/14/2014  . Protein-calorie malnutrition, severe ([place]) 04/14/2014  . Altered mental status   . Dehydration 04/13/2014  . Altered mental state 04/13/2014  . Acute kidney injury ([place])   . Hypokalemia 03/25/2014  . Syncope 03/16/2014  . Acute  encephalopathy 03/16/2014  . Leg swelling 03/02/2014  . Avascular necrosis of bone of right hip ([place]) 09/27/2013  . Primary osteoarthritis of right hip 09/07/2013  . Spinal stenosis, lumbar region, with neurogenic claudication 09/07/2013  . Closed fracture of pubic ramus ([place]) 09/07/2013  . BPPV (benign paroxysmal positional vertigo) 09/07/2013  . Pharyngeal swelling 08/30/2013  . Pelvic fracture ([place]) 08/28/2013  . Fall 08/26/2013  . Dizziness 08/26/2013  . Abnormal stress test 08/26/2013  . Preop cardiovascular exam 08/01/2013  . Aortic stenosis 08/01/2013  . Edema 06/30/2013  . Loss of weight 01/04/2013  . Situational stress 01/04/2013  . Chronic right hip pain 07/05/2012  . Knee pain, acute 06/29/2012  . Vertigo 06/24/2012  . Acute exacerbation of chronic low back pain 06/24/2012  . Lumbar pain with radiation down right leg 06/09/2012  . Osteoarthritis of hip 04/29/2012  . History of TIAs 04/17/2012  . S/P total hysterectomy and bilateral salpingo-oophorectomy 04/17/2012  . Hard of hearing 04/17/2012  . History of anemia 04/17/2012  . RLS (restless  legs syndrome) 04/17/2012  . Chronic back pain 04/17/2012  . Spinal stenosis, lumbar 04/17/2012  . Lower back pain 12/20/2011  . Personal history of colonic polyps 10/08/2011  . H/O: CVA (cardiovascular accident) 09/14/2011  . Essential hypertension 09/14/2011  . Restless legs syndrome 09/14/2011  . Constipation 08/20/2011  . Urinary incontinence 08/20/2011    CMP     Component Value Date/Time   NA 138 06/13/2016   K 4.1 06/13/2016   CL 101 01/12/2016 0536   CO2 27 01/12/2016 0536   GLUCOSE 120 (H) 01/12/2016 0536   BUN 36 (A) 06/13/2016   CREATININE 1.4 (A) 06/13/2016   CREATININE 0.51 01/12/2016 0536   CREATININE 0.78 06/28/2014 1205   CALCIUM 8.3 (L) 01/12/2016 0536   PROT 7.6 01/08/2016 0412   ALBUMIN 4.4 01/08/2016 0412   AST 21 03/19/2016   ALT 12 03/19/2016   ALKPHOS 104 03/19/2016   BILITOT 0.5 01/08/2016 0412   GFRNONAA >60 01/12/2016 0536   GFRAA >60 01/12/2016 0536    Recent Labs  01/10/16 0838  01/11/16 0751  01/12/16 0536 02/04/16 03/19/16 06/13/16  NA 143  < > 137  < > 135 138 136* 138  K 3.3*  < > 3.0*  --  3.7 4.8 4.6 4.1  CL 107  --  102  --  101  --   --   --   CO2 27  --  26  --  27  --   --   --   GLUCOSE 133*  --  133*  --  120*  --   --   --   BUN 29*  < > 28*  < > 23* 34* 40* 36*  CREATININE 0.71  < > 0.74  < > 0.51 1.0 1.2* 1.4*  CALCIUM 8.8*  --  8.4*  --  8.3*  --   --   --   MG 2.0  --   --   --   --   --   --   --   < > = values in this interval not displayed.  Recent Labs  12/31/15 01/08/16 0412 03/19/16  AST 17 21 21   ALT 10 15 12   ALKPHOS 99 109 104  BILITOT  --  0.5  --   PROT  --  7.6  --   ALBUMIN  --  4.4  --     Recent Labs  01/08/16 0412  01/10/16 [id number]  01/11/16 0751 01/12/16 01/12/16 0536 03/19/16  WBC 26.4*  < > 15.1*  < > 12.6* 9.4 9.4 8.0  NEUTROABS 24.0*  --   --   --   --   --   --   --   HGB 12.2  < > 11.9*  < > 10.9*  --  10.5* 9.6*  HCT 39.2  < > 36.9  < > 33.5*  --  32.6* 30*  MCV 86.5  < > 85.2  --   85.0  --  83.8  --   PLT 336  < > 311  < > 307  --  250 278  < > = values in this interval not displayed.  Recent Labs  03/19/16  CHOL 141  LDLCALC 71  TRIG 77   No results found for: [facility] Lab Results  Component Value Date   TSH 2.09 03/19/2016   Lab Results  Component Value Date   HGBA1C 5.6 03/19/2016   Lab Results  Component Value Date   CHOL 141 03/19/2016   HDL 55 03/19/2016   LDLCALC 71 03/19/2016   TRIG 77 03/19/2016   CHOLHDL 2.8 03/06/2014    Significant Diagnostic Results in last 30 days:  No results found.  Assessment and Plan  Essential hypertension Controlled ;cont norvasc 5 mg daily and demadex 40 mg daily  Constipation No reported [problems; now on amitiza 24 mcg BID;plan to cont current meds  Hypokalemia Most recent K+ was 4.1;plan to cont K+ 20 meq BID      Shelly Martin. [name]  MD

## 2016-06-15 ENCOUNTER — Encounter: Payer: Self-pay | Admitting: Internal Medicine

## 2016-06-15 NOTE — Assessment & Plan Note (Addendum)
No reported [problems; now on amitiza 24 mcg BID;plan to cont current meds

## 2016-06-15 NOTE — Assessment & Plan Note (Signed)
Controlled ;cont norvasc 5 mg daily and demadex 40 mg daily

## 2016-06-15 NOTE — Assessment & Plan Note (Signed)
Most recent K+ was 4.1;plan to cont K+ 20 meq BID

## 2016-06-20 ENCOUNTER — Non-Acute Institutional Stay (SKILLED_NURSING_FACILITY): Payer: Medicare Other | Admitting: Internal Medicine

## 2016-06-20 ENCOUNTER — Encounter: Payer: Self-pay | Admitting: Internal Medicine

## 2016-06-20 DIAGNOSIS — R600 Localized edema: Secondary | ICD-10-CM

## 2016-06-20 NOTE — Progress Notes (Signed)
Location:  [facility] Room Number: 303D Place of Service:  SNF [phone])  [name], MD  Patient Care Team: Lanice Shirts, MD as PCP - General (Internal Medicine) [name], MD as Consulting Physician (Orthopedic Surgery) [name], MD as Consulting Physician (Otolaryngology)  Extended Emergency Contact Information Primary Emergency Contact: [name] Address: [place], [place] [address] [name] of [place] Phone: [phone] Relation: Daughter Secondary Emergency Contact: [place], [place] of [place] Mobile Phone: [phone] Relation: Son    Allergies: Amoxicillin; Baclofen; Hctz [hydrochlorothiazide]; and Valium [diazepam]  Chief Complaint  Patient presents with  . Acute Visit    Acute    HPI: Patient is 83 y.o. female  Who is being seen today because she is still worried about her LE edema. What cab she do? Should she wear TED hose? Pt had no SOB or CP and no reported abnormal weight gain.  Past Medical History:  Diagnosis Date  . Acute encephalopathy   . AKI (acute kidney injury) ([place])   . Altered mental state   . Anemia   . Aortic stenosis   . Arthritis    Back, knees  . Bowel incontinence   . Colon polyps   . Constipation   . Gastric ulcer    years ago  . Hypertension   . Hypokalemia   . Restless legs syndrome    on Requip  . Situational stress   . Spinal stenosis   . Stroke ([place])    Mini, no residual  . TIA (transient ischemic attack)   . Ulcer causing bleeding and hole in wall of stomach or small intestine   . Urinary bladder calculus   . Urinary incontinence   . Vertigo     Past Surgical History:  Procedure Laterality Date  . COLON RESECTION  50 years ago  . LUMBAR LAMINECTOMY/DECOMPRESSION MICRODISCECTOMY  06/25/2011   Procedure: LUMBAR LAMINECTOMY/DECOMPRESSION MICRODISCECTOMY;  Surgeon: Hosie Spangle, MD;   Location: [facility];  Service: Neurosurgery;  Laterality: Right;  RIGHT Lumbar Two-Three hemilaminectomy and microdiskectomy  . TONSILLECTOMY    . UTERINE FIBROID SURGERY    . VAGINAL HYSTERECTOMY      Allergies as of 06/20/2016      Reactions   Amoxicillin Other (See Comments)   Reaction unknown   Baclofen Other (See Comments)   "fuzzy in the eyes" and dizzy   Hctz [hydrochlorothiazide]    nausea   Valium [diazepam] Other (See Comments)   Pt became unresponsive and O2 sats dropped.      Medication List       Accurate as of 06/20/16  2:57 PM. Always use your most recent med list.          amLODipine 5 MG tablet Commonly known as:  NORVASC Take 5 mg by mouth daily.   CHLORASEPTIC MAX SORE THROAT 1.5-33 % Liqd Generic drug:  Phenol-Glycerin Use as directed 1 spray in the mouth or throat 3 (three) times daily as needed. For throat irritation/pain. If sore throat persist notify the MD   feeding supplement Liqd Take 1 Container by mouth 2 (two) times daily between meals.   furosemide 80 MG tablet Commonly known as:  LASIX Take 80 mg by mouth every morning.   lubiprostone 24 MCG capsule Commonly known as:  AMITIZA Take 24 mcg by mouth  2 (two) times daily with a meal.   magnesium hydroxide 400 MG/5ML suspension Commonly known as:  MILK OF MAGNESIA Take 30 mLs by mouth daily as needed for mild constipation. Reported on 07/27/2015   mirtazapine 15 MG tablet Commonly known as:  REMERON Take 7.5 mg by mouth at bedtime.   morphine 15 MG 12 hr tablet Commonly known as:  MS CONTIN Take 15 mg by mouth 2 (two) times daily.   multivitamin tablet Take 1 tablet by mouth daily.   ondansetron 4 MG tablet Commonly known as:  ZOFRAN Take 4 mg by mouth every 12 (twelve) hours as needed for nausea or vomiting.   pantoprazole 40 MG tablet Commonly known as:  PROTONIX Take 40 mg by mouth daily.   potassium chloride 20 MEQ packet Commonly known as:  KLOR-CON Take 20 mEq by  mouth 2 (two) times daily.   rOPINIRole 4 MG tablet Commonly known as:  REQUIP Take 4 mg by mouth at bedtime.   SYSTANE BALANCE 0.6 % Soln Generic drug:  Propylene Glycol Place 1 drop into both eyes 2 (two) times daily.       No orders of the defined types were placed in this encounter.   Immunization History  Administered Date(s) Administered  . Influenza Split 04/15/2012  . Influenza,inj,Quad PF,36+ Mos 03/06/2014  . Influenza-Unspecified 03/01/2016  . PPD Test 08/30/2014, 09/18/2014  . Pneumococcal Polysaccharide-23 08/29/2013    Social History  Substance Use Topics  . Smoking status: Never Smoker  . Smokeless tobacco: Never Used  . Alcohol use 0.0 oz/week     Comment: rarely    Review of Systems  DATA OBTAINED: from patient GENERAL:  no fevers, fatigue, appetite changes SKIN: No itching, rash HEENT: No complaint RESPIRATORY: No cough, wheezing, SOB CARDIAC: No chest pain, palpitations,+ lower extremity edema  GI: No abdominal pain, No N/V/D or constipation, No heartburn or reflux  GU: No dysuria, frequency or urgency, or incontinence  MUSCULOSKELETAL: No unrelieved bone/joint pain NEUROLOGIC: No headache, dizziness  PSYCHIATRIC: No overt anxiety or sadness  Vitals:   06/20/16 1455  BP: (!) 100/58  Pulse: 98  Resp: 15  Temp: 98.6 F (37 C)   Body mass index is 20.08 kg/m. Physical Exam  GENERAL APPEARANCE: Alert, conversant, No acute distress  SKIN: No diaphoresis rash HEENT: Unremarkable RESPIRATORY: Breathing is even, unlabored. Lung sounds are clear   CARDIOVASCULAR: Heart RRR no murmurs, rubs or gallops. 1/2 -1+ peripheral edema  GASTROINTESTINAL: Abdomen is soft, non-tender, not distended w/ normal bowel sounds.  GENITOURINARY: Bladder non tender, not distended  MUSCULOSKELETAL: No abnormal joints or musculature NEUROLOGIC: Cranial nerves 2-12 grossly intact. Moves all extremities PSYCHIATRIC: Mood and affect appropriate to situation, no  behavioral issues  Patient Active Problem List   Diagnosis Date Noted  . Chronic right shoulder pain 02/09/2016  . Bilateral lower extremity edema 02/02/2016  . Ileitis 01/19/2016  . Pressure ulcer 01/09/2016  . SBO (small bowel obstruction) 01/08/2016  . Narcotic addiction ([place]) 05/15/2015  . Diarrhea 03/30/2015  . Acute pharyngitis 02/26/2015  . Osteoarthritis of right knee 02/22/2015  . Pain in joint, lower leg 10/31/2014  . Ulcer of sacral region, stage 4 ([place]) 04/15/2014  . Moderate aortic stenosis 04/14/2014  . Pre-syncope 04/14/2014  . Hypotension 04/14/2014  . Acute on chronic renal failure ([place]) 04/14/2014  . Anemia 04/14/2014  . Protein-calorie malnutrition, severe ([place]) 04/14/2014  . Altered mental status   . Dehydration 04/13/2014  . Altered mental state 04/13/2014  .  Acute kidney injury ([place])   . Hypokalemia 03/25/2014  . Syncope 03/16/2014  . Acute encephalopathy 03/16/2014  . Leg swelling 03/02/2014  . Avascular necrosis of bone of right hip ([place]) 09/27/2013  . Primary osteoarthritis of right hip 09/07/2013  . Spinal stenosis, lumbar region, with neurogenic claudication 09/07/2013  . Closed fracture of pubic ramus ([place]) 09/07/2013  . BPPV (benign paroxysmal positional vertigo) 09/07/2013  . Pharyngeal swelling 08/30/2013  . Pelvic fracture ([place]) 08/28/2013  . Fall 08/26/2013  . Dizziness 08/26/2013  . Abnormal stress test 08/26/2013  . Preop cardiovascular exam 08/01/2013  . Aortic stenosis 08/01/2013  . Edema 06/30/2013  . Loss of weight 01/04/2013  . Situational stress 01/04/2013  . Chronic right hip pain 07/05/2012  . Knee pain, acute 06/29/2012  . Vertigo 06/24/2012  . Acute exacerbation of chronic low back pain 06/24/2012  . Lumbar pain with radiation down right leg 06/09/2012  . Osteoarthritis of hip 04/29/2012  . History of TIAs 04/17/2012  . S/P total hysterectomy and bilateral salpingo-oophorectomy 04/17/2012  . Hard of hearing 04/17/2012    . History of anemia 04/17/2012  . RLS (restless legs syndrome) 04/17/2012  . Chronic back pain 04/17/2012  . Spinal stenosis, lumbar 04/17/2012  . Lower back pain 12/20/2011  . Personal history of colonic polyps 10/08/2011  . H/O: CVA (cardiovascular accident) 09/14/2011  . Essential hypertension 09/14/2011  . Restless legs syndrome 09/14/2011  . Constipation 08/20/2011  . Urinary incontinence 08/20/2011    CMP     Component Value Date/Time   NA 138 06/13/2016   K 4.1 06/13/2016   CL 101 01/12/2016 0536   CO2 27 01/12/2016 0536   GLUCOSE 120 (H) 01/12/2016 0536   BUN 36 (A) 06/13/2016   CREATININE 1.4 (A) 06/13/2016   CREATININE 0.51 01/12/2016 0536   CREATININE 0.78 06/28/2014 1205   CALCIUM 8.3 (L) 01/12/2016 0536   PROT 7.6 01/08/2016 0412   ALBUMIN 4.4 01/08/2016 0412   AST 21 03/19/2016   ALT 12 03/19/2016   ALKPHOS 104 03/19/2016   BILITOT 0.5 01/08/2016 0412   GFRNONAA >60 01/12/2016 0536   GFRAA >60 01/12/2016 0536    Recent Labs  01/10/16 0838  01/11/16 0751  01/12/16 0536 02/04/16 03/19/16 06/13/16  NA 143  < > 137  < > 135 138 136* 138  K 3.3*  < > 3.0*  --  3.7 4.8 4.6 4.1  CL 107  --  102  --  101  --   --   --   CO2 27  --  26  --  27  --   --   --   GLUCOSE 133*  --  133*  --  120*  --   --   --   BUN 29*  < > 28*  < > 23* 34* 40* 36*  CREATININE 0.71  < > 0.74  < > 0.51 1.0 1.2* 1.4*  CALCIUM 8.8*  --  8.4*  --  8.3*  --   --   --   MG 2.0  --   --   --   --   --   --   --   < > = values in this interval not displayed.  Recent Labs  12/31/15 01/08/16 0412 03/19/16  AST 17 21 21   ALT 10 15 12   ALKPHOS 99 109 104  BILITOT  --  0.5  --   PROT  --  7.6  --   ALBUMIN  --  4.4  --     Recent Labs  01/08/16 0412  01/10/16 0838  01/11/16 0751 01/12/16 01/12/16 0536 03/19/16  WBC 26.4*  < > 15.1*  < > 12.6* 9.4 9.4 8.0  NEUTROABS 24.0*  --   --   --   --   --   --   --   HGB 12.2  < > 11.9*  < > 10.9*  --  10.5* 9.6*  HCT 39.2  < > 36.9   < > 33.5*  --  32.6* 30*  MCV 86.5  < > 85.2  --  85.0  --  83.8  --   PLT 336  < > 311  < > 307  --  250 278  < > = values in this interval not displayed.  Recent Labs  03/19/16  CHOL 141  LDLCALC 71  TRIG 77   No results found for: [facility] Lab Results  Component Value Date   TSH 2.09 03/19/2016   Lab Results  Component Value Date   HGBA1C 5.6 03/19/2016   Lab Results  Component Value Date   CHOL 141 03/19/2016   HDL 55 03/19/2016   LDLCALC 71 03/19/2016   TRIG 77 03/19/2016   CHOLHDL 2.8 03/06/2014    Significant Diagnostic Results in last 30 days:  No results found.  Assessment and Plan  BLE EDEMA - pt reassured; her swelling is better and reminded her it gets better overnight; TED hose are good for her     [name], MD

## 2016-06-26 ENCOUNTER — Non-Acute Institutional Stay (SKILLED_NURSING_FACILITY): Payer: Medicare Other | Admitting: Internal Medicine

## 2016-06-26 DIAGNOSIS — Z20828 Contact with and (suspected) exposure to other viral communicable diseases: Secondary | ICD-10-CM | POA: Diagnosis not present

## 2016-07-06 ENCOUNTER — Encounter: Payer: Self-pay | Admitting: Internal Medicine

## 2016-07-11 ENCOUNTER — Non-Acute Institutional Stay (SKILLED_NURSING_FACILITY): Payer: Medicare Other | Admitting: Internal Medicine

## 2016-07-11 ENCOUNTER — Encounter: Payer: Self-pay | Admitting: Internal Medicine

## 2016-07-11 DIAGNOSIS — G2581 Restless legs syndrome: Secondary | ICD-10-CM | POA: Diagnosis not present

## 2016-07-11 DIAGNOSIS — R600 Localized edema: Secondary | ICD-10-CM

## 2016-07-11 DIAGNOSIS — F1120 Opioid dependence, uncomplicated: Secondary | ICD-10-CM

## 2016-07-11 NOTE — Progress Notes (Signed)
Location:  [facility] Room Number: 303D Place of Service:  SNF [phone])  [name], MD  Patient Care Team: Lanice Shirts, MD as PCP - General (Internal Medicine) [name], MD as Consulting Physician (Orthopedic Surgery) [name], MD as Consulting Physician (Otolaryngology)  Extended Emergency Contact Information Primary Emergency Contact: [name] Address: [place], [place] [address] [name] of [place] Phone: [phone] Relation: Daughter Secondary Emergency Contact: [place], [place] of [place] Mobile Phone: [phone] Relation: Son    Allergies: Amoxicillin; Baclofen; Hctz [hydrochlorothiazide]; and Valium [diazepam]  Chief Complaint  Patient presents with  . Medical Management of Chronic Issues    Routine Visit    HPI: Patient is 83 y.o. female who is being seen for routine issues of BLE edema, narcotic addiction and RLS.  Past Medical History:  Diagnosis Date  . Acute encephalopathy   . AKI (acute kidney injury) ([place])   . Altered mental state   . Anemia   . Aortic stenosis   . Arthritis    Back, knees  . Bowel incontinence   . Colon polyps   . Constipation   . Gastric ulcer    years ago  . Hypertension   . Hypokalemia   . Restless legs syndrome    on Requip  . Situational stress   . Spinal stenosis   . Stroke ([place])    Mini, no residual  . TIA (transient ischemic attack)   . Ulcer causing bleeding and hole in wall of stomach or small intestine   . Urinary bladder calculus   . Urinary incontinence   . Vertigo     Past Surgical History:  Procedure Laterality Date  . COLON RESECTION  50 years ago  . LUMBAR LAMINECTOMY/DECOMPRESSION MICRODISCECTOMY  06/25/2011   Procedure: LUMBAR LAMINECTOMY/DECOMPRESSION MICRODISCECTOMY;  Surgeon: Hosie Spangle, MD;  Location: [facility];  Service: Neurosurgery;  Laterality:  Right;  RIGHT Lumbar Two-Three hemilaminectomy and microdiskectomy  . TONSILLECTOMY    . UTERINE FIBROID SURGERY    . VAGINAL HYSTERECTOMY      Allergies as of 07/11/2016      Reactions   Amoxicillin Other (See Comments)   Reaction unknown   Baclofen Other (See Comments)   "fuzzy in the eyes" and dizzy   Hctz [hydrochlorothiazide]    nausea   Valium [diazepam] Other (See Comments)   Pt became unresponsive and O2 sats dropped.      Medication List       Accurate as of 07/11/16 11:59 PM. Always use your most recent med list.          amLODipine 5 MG tablet Commonly known as:  NORVASC Take 5 mg by mouth daily.   CHLORASEPTIC MAX SORE THROAT 1.5-33 % Liqd Generic drug:  Phenol-Glycerin Use as directed 1 spray in the mouth or throat 3 (three) times daily as needed. For throat irritation/pain. If sore throat persist notify the MD   feeding supplement Liqd Take 1 Container by mouth 2 (two) times daily between meals.   furosemide 80 MG tablet Commonly known as:  LASIX Take 80 mg by mouth every morning.   HYDROcodone-acetaminophen 7.5-325 MG tablet Commonly known as:  NORCO Take 1 tablet by mouth 4 (four) times daily as needed for moderate pain.   lubiprostone 24 MCG capsule Commonly known as:  AMITIZA Take 24 mcg by mouth 2 (two) times daily with a meal.   magnesium hydroxide 400 MG/5ML suspension Commonly known as:  MILK OF MAGNESIA Take 30 mLs by mouth daily as needed for mild constipation. Reported on 07/27/2015   mirtazapine 15 MG tablet Commonly known as:  REMERON Take 15 mg by mouth at bedtime.   morphine 15 MG 12 hr tablet Commonly known as:  MS CONTIN Take 15 mg by mouth 2 (two) times daily.   multivitamin tablet Take 1 tablet by mouth daily.   ondansetron 4 MG tablet Commonly known as:  ZOFRAN Take 4 mg by mouth every 12 (twelve) hours as needed for nausea or vomiting.   pantoprazole 40 MG tablet Commonly known as:  PROTONIX Take 40 mg by mouth  daily.   potassium chloride 20 MEQ packet Commonly known as:  KLOR-CON Take 20 mEq by mouth 2 (two) times daily.   rOPINIRole 4 MG tablet Commonly known as:  REQUIP Take 4 mg by mouth at bedtime.   SYSTANE BALANCE 0.6 % Soln Generic drug:  Propylene Glycol Place 1 drop into both eyes 2 (two) times daily.       Meds ordered this encounter  Medications  . HYDROcodone-acetaminophen (NORCO) 7.5-325 MG tablet    Sig: Take 1 tablet by mouth 4 (four) times daily as needed for moderate pain.    Immunization History  Administered Date(s) Administered  . Influenza Split 04/15/2012  . Influenza,inj,Quad PF,36+ Mos 03/06/2014  . Influenza-Unspecified 03/01/2016  . PPD Test 08/30/2014, 09/18/2014  . Pneumococcal Polysaccharide-23 08/29/2013    Social History  Substance Use Topics  . Smoking status: Never Smoker  . Smokeless tobacco: Never Used  . Alcohol use 0.0 oz/week     Comment: rarely    Review of Systems  DATA OBTAINED: from patient GENERAL:  no fevers, fatigue, appetite changes SKIN: No itching, rash HEENT: No complaint RESPIRATORY: No cough, wheezing, SOB CARDIAC: No chest pain, palpitations, lower extremity edema  GI: No abdominal pain, No N/V/D or constipation, No heartburn or reflux  GU: No dysuria, frequency or urgency, or incontinence  MUSCULOSKELETAL: No unrelieved bone/joint pain NEUROLOGIC: No headache, dizziness  PSYCHIATRIC: No overt anxiety or sadness  Vitals:   07/11/16 1123  BP: 122/61  Pulse: 83  Resp: 16  Temp: 98.4 F (36.9 C)   Body mass index is 19.76 kg/m. Physical Exam  GENERAL APPEARANCE: Alert, conversant, No acute distress  SKIN: No diaphoresis rash HEENT: Unremarkable RESPIRATORY: Breathing is even, unlabored. Lung sounds are clear   CARDIOVASCULAR: Heart RRR no murmurs, rubs or gallops. No peripheral edema  GASTROINTESTINAL: Abdomen is soft, non-tender, not distended w/ normal bowel sounds.  GENITOURINARY: Bladder non tender,  not distended  MUSCULOSKELETAL: No abnormal joints or musculature NEUROLOGIC: Cranial nerves 2-12 grossly intact. Moves all extremities PSYCHIATRIC: Mood and affect appropriate to situation, touch of dementia starting, no behavioral issues  Patient Active Problem List   Diagnosis Date Noted  . Chronic right shoulder pain 02/09/2016  . Bilateral lower extremity edema 02/02/2016  . Ileitis 01/19/2016  . Pressure ulcer 01/09/2016  . SBO (small bowel obstruction) 01/08/2016  . Narcotic addiction ([place]) 05/15/2015  . Diarrhea 03/30/2015  . Acute pharyngitis 02/26/2015  . Osteoarthritis of right knee 02/22/2015  . Pain in joint, lower leg 10/31/2014  . Ulcer of sacral region, stage 4 ([place]) 04/15/2014  . Moderate aortic stenosis 04/14/2014  . Pre-syncope 04/14/2014  . Hypotension 04/14/2014  . Acute on chronic renal failure ([facility]) 04/14/2014  .  Anemia 04/14/2014  . Protein-calorie malnutrition, severe ([place]) 04/14/2014  . Altered mental status   . Dehydration 04/13/2014  . Altered mental state 04/13/2014  . Acute kidney injury ([place])   . Hypokalemia 03/25/2014  . Syncope 03/16/2014  . Acute encephalopathy 03/16/2014  . Leg swelling 03/02/2014  . Avascular necrosis of bone of right hip ([place]) 09/27/2013  . Primary osteoarthritis of right hip 09/07/2013  . Spinal stenosis, lumbar region, with neurogenic claudication 09/07/2013  . Closed fracture of pubic ramus ([place]) 09/07/2013  . BPPV (benign paroxysmal positional vertigo) 09/07/2013  . Pharyngeal swelling 08/30/2013  . Pelvic fracture ([place]) 08/28/2013  . Fall 08/26/2013  . Dizziness 08/26/2013  . Abnormal stress test 08/26/2013  . Preop cardiovascular exam 08/01/2013  . Aortic stenosis 08/01/2013  . Edema 06/30/2013  . Loss of weight 01/04/2013  . Situational stress 01/04/2013  . Chronic right hip pain 07/05/2012  . Knee pain, acute 06/29/2012  . Vertigo 06/24/2012  . Acute exacerbation of chronic low back pain 06/24/2012  .  Lumbar pain with radiation down right leg 06/09/2012  . Osteoarthritis of hip 04/29/2012  . History of TIAs 04/17/2012  . S/P total hysterectomy and bilateral salpingo-oophorectomy 04/17/2012  . Hard of hearing 04/17/2012  . History of anemia 04/17/2012  . RLS (restless legs syndrome) 04/17/2012  . Chronic back pain 04/17/2012  . Spinal stenosis, lumbar 04/17/2012  . Lower back pain 12/20/2011  . Personal history of colonic polyps 10/08/2011  . H/O: CVA (cardiovascular accident) 09/14/2011  . Essential hypertension 09/14/2011  . Restless legs syndrome 09/14/2011  . Constipation 08/20/2011  . Urinary incontinence 08/20/2011    CMP     Component Value Date/Time   NA 138 06/13/2016   K 4.1 06/13/2016   CL 101 01/12/2016 0536   CO2 27 01/12/2016 0536   GLUCOSE 120 (H) 01/12/2016 0536   BUN 36 (A) 06/13/2016   CREATININE 1.4 (A) 06/13/2016   CREATININE 0.51 01/12/2016 0536   CREATININE 0.78 06/28/2014 1205   CALCIUM 8.3 (L) 01/12/2016 0536   PROT 7.6 01/08/2016 0412   ALBUMIN 4.4 01/08/2016 0412   AST 16 06/04/2016   ALT 6 (A) 06/04/2016   ALKPHOS 109 06/04/2016   BILITOT 0.5 01/08/2016 0412   GFRNONAA >60 01/12/2016 0536   GFRAA >60 01/12/2016 0536    Recent Labs  01/10/16 [id number]  01/11/16 0751  01/12/16 0536  04/26/16 06/04/16 06/13/16  NA 143  < > 137  < > 135  < > 141 140 138  K 3.3*  < > 3.0*  --  3.7  < > 3.8 4.4 4.1  CL 107  --  102  --  101  --   --   --   --   CO2 27  --  26  --  27  --   --   --   --   GLUCOSE 133*  --  133*  --  120*  --   --   --   --   BUN 29*  < > 28*  < > 23*  < > 23* 34* 36*  CREATININE 0.71  < > 0.74  < > 0.51  < > 1.1 1.3* 1.4*  CALCIUM 8.8*  --  8.4*  --  8.3*  --   --   --   --   MG 2.0  --   --   --   --   --   --   --   --   < > =  values in this interval not displayed.  Recent Labs  01/08/16 0412 03/19/16 06/04/16  AST 21 21 16   ALT 15 12 6*  ALKPHOS 109 104 109  BILITOT 0.5  --   --   PROT 7.6  --   --   ALBUMIN 4.4  --    --     Recent Labs  01/08/16 0412  01/10/16 0838  01/11/16 0751  01/12/16 0536 03/19/16 04/26/16  WBC 26.4*  < > 15.1*  < > 12.6*  < > 9.4 8.0 6.5  NEUTROABS 24.0*  --   --   --   --   --   --   --   --   HGB 12.2  < > 11.9*  < > 10.9*  --  10.5* 9.6* 8.5*  HCT 39.2  < > 36.9  < > 33.5*  --  32.6* 30* 27*  MCV 86.5  < > 85.2  --  85.0  --  83.8  --   --   PLT 336  < > 311  < > 307  --  250 278 266  < > = values in this interval not displayed.  Recent Labs  03/19/16  CHOL 141  LDLCALC 71  TRIG 77   No results found for: [facility] Lab Results  Component Value Date   TSH 2.46 04/26/2016   Lab Results  Component Value Date   HGBA1C 5.6 03/19/2016   Lab Results  Component Value Date   CHOL 141 03/19/2016   HDL 55 03/19/2016   LDLCALC 71 03/19/2016   TRIG 77 03/19/2016   CHOLHDL 2.8 03/06/2014    Significant Diagnostic Results in last 30 days:  No results found.  Assessment and Plan  Bilateral lower extremity edema Present but has improved a lot with demadex increase; pt wears TED hose at times; will cont to monitor  Narcotic addiction ([place]) Continues to be o  Morphine 15 mg q 12 and prn oxycodone; pt never asked me for pain medications or c/o pain  RLS (restless legs syndrome) Adequately controlled with requip 4 mg q HS    Henok Heacock D. [name], MD

## 2016-07-14 ENCOUNTER — Encounter: Payer: Self-pay | Admitting: Internal Medicine

## 2016-07-14 ENCOUNTER — Non-Acute Institutional Stay (SKILLED_NURSING_FACILITY): Payer: Medicare Other | Admitting: Internal Medicine

## 2016-07-14 DIAGNOSIS — L98411 Non-pressure chronic ulcer of buttock limited to breakdown of skin: Secondary | ICD-10-CM

## 2016-07-14 NOTE — Progress Notes (Signed)
Opened in error; Disregard.

## 2016-07-14 NOTE — Progress Notes (Signed)
Location:  [facility] Room Number: 303D Place of Service:  SNF [phone])  Shelly Martin. [name], MD  Patient Care Team: Lanice Shirts, MD as PCP - General (Internal Medicine) [name], MD as Consulting Physician (Orthopedic Surgery) [name], MD as Consulting Physician (Otolaryngology)  Extended Emergency Contact Information Primary Emergency Contact: [name] Address: [place], [place] [address] [name] of [place] Phone: [phone] Relation: Daughter Secondary Emergency Contact: [name], [place] of [place] Mobile Phone: [phone] Relation: Son    Allergies: Amoxicillin; Baclofen; Hctz [hydrochlorothiazide]; and Valium [diazepam]  Chief Complaint  Patient presents with  . Acute Visit    Acute    HPI: Patient is 83 y.o. female who the wound nurse asked me to see. Pt has a breakdown on her R buttocks and a strange swelling around it. Pt denies pain, there has been no pain , there is no heat. Leonard Downing found that pt has covered her WC seat with something which hurts how the seat supports and she also found a pen under the cushion which could cause a problem.  Past Medical History:  Diagnosis Date  . Acute encephalopathy   . AKI (acute kidney injury) ([place])   . Altered mental state   . Anemia   . Aortic stenosis   . Arthritis    Back, knees  . Bowel incontinence   . Colon polyps   . Constipation   . Gastric ulcer    years ago  . Hypertension   . Hypokalemia   . Restless legs syndrome    on Requip  . Situational stress   . Spinal stenosis   . Stroke ([place])    Mini, no residual  . TIA (transient ischemic attack)   . Ulcer causing bleeding and hole in wall of stomach or small intestine   . Urinary bladder calculus   . Urinary incontinence   . Vertigo     Past Surgical History:  Procedure Laterality Date  . COLON RESECTION  50 years ago  .  LUMBAR LAMINECTOMY/DECOMPRESSION MICRODISCECTOMY  06/25/2011   Procedure: LUMBAR LAMINECTOMY/DECOMPRESSION MICRODISCECTOMY;  Surgeon: Hosie Spangle, MD;  Location: [facility];  Service: Neurosurgery;  Laterality: Right;  RIGHT Lumbar Two-Three hemilaminectomy and microdiskectomy  . TONSILLECTOMY    . UTERINE FIBROID SURGERY    . VAGINAL HYSTERECTOMY      Allergies as of 07/14/2016      Reactions   Amoxicillin Other (See Comments)   Reaction unknown   Baclofen Other (See Comments)   "fuzzy in the eyes" and dizzy   Hctz [hydrochlorothiazide]    nausea   Valium [diazepam] Other (See Comments)   Pt became unresponsive and O2 sats dropped.      Medication List       Accurate as of 07/14/16 12:23 PM. Always use your most recent med list.          amLODipine 5 MG tablet Commonly known as:  NORVASC Take 5 mg by mouth daily.   CHLORASEPTIC MAX SORE THROAT 1.5-33 % Liqd Generic drug:  Phenol-Glycerin Use as directed 1 spray in the mouth or throat 3 (three) times daily as needed. For throat irritation/pain. If sore throat persist notify the MD   feeding supplement Liqd Take 1 Container by mouth 2 (two) times daily between meals.   furosemide 80 MG  tablet Commonly known as:  LASIX Take 80 mg by mouth every morning.   lubiprostone 24 MCG capsule Commonly known as:  AMITIZA Take 24 mcg by mouth 2 (two) times daily with a meal.   magnesium hydroxide 400 MG/5ML suspension Commonly known as:  MILK OF MAGNESIA Take 30 mLs by mouth daily as needed for mild constipation. Reported on 07/27/2015   mirtazapine 15 MG tablet Commonly known as:  REMERON Take 7.5 mg by mouth at bedtime.   morphine 15 MG 12 hr tablet Commonly known as:  MS CONTIN Take 15 mg by mouth 2 (two) times daily.   multivitamin tablet Take 1 tablet by mouth daily.   ondansetron 4 MG tablet Commonly known as:  ZOFRAN Take 4 mg by mouth every 12 (twelve) hours as needed for nausea or vomiting.     pantoprazole 40 MG tablet Commonly known as:  PROTONIX Take 40 mg by mouth daily.   potassium chloride 20 MEQ packet Commonly known as:  KLOR-CON Take 20 mEq by mouth 2 (two) times daily.   rOPINIRole 4 MG tablet Commonly known as:  REQUIP Take 4 mg by mouth at bedtime.   SYSTANE BALANCE 0.6 % Soln Generic drug:  Propylene Glycol Place 1 drop into both eyes 2 (two) times daily.       No orders of the defined types were placed in this encounter.   Immunization History  Administered Date(s) Administered  . Influenza Split 04/15/2012  . Influenza,inj,Quad PF,36+ Mos 03/06/2014  . Influenza-Unspecified 03/01/2016  . PPD Test 08/30/2014, 09/18/2014  . Pneumococcal Polysaccharide-23 08/29/2013    Social History  Substance Use Topics  . Smoking status: Never Smoker  . Smokeless tobacco: Never Used  . Alcohol use 0.0 oz/week     Comment: rarely    Review of Systems  DATA OBTAINED: from patient - as per HPI GENERAL:  no fevers, fatigue, appetite changes SKIN:R buttocks - as per HPI HEENT: No complaint RESPIRATORY: No cough, wheezing, SOB CARDIAC: No chest pain, palpitations, lower extremity edema  GI: No abdominal pain, No N/V/D or constipation, No heartburn or reflux  GU: No dysuria, frequency or urgency, or incontinence  MUSCULOSKELETAL: No unrelieved bone/joint pain NEUROLOGIC: No headache, dizziness  PSYCHIATRIC: No overt anxiety or sadness  Vitals:   07/14/16 1214  BP: 122/61  Pulse: 78  Resp: 20  Temp: 98.4 F (36.9 C)   Body mass index is 19.76 kg/m. Physical Exam  GENERAL APPEARANCE: Alert, conversant, No acute distress  SKIN:R buttocks shallow  Abrasion and proximal to it a firm swelling that is not tender, no heat, pressure of it doesn't yield any drainage HEENT: Unremarkable RESPIRATORY: Breathing is even, unlabored. Lung sounds are clear   CARDIOVASCULAR: Heart RRR no murmurs, rubs or gallops. No peripheral edema  GASTROINTESTINAL: Abdomen is  soft, non-tender, not distended w/ normal bowel sounds.  GENITOURINARY: Bladder non tender, not distended  MUSCULOSKELETAL: No abnormal joints or musculature NEUROLOGIC: Cranial nerves 2-12 grossly intact. Moves all extremities PSYCHIATRIC: Mood and affect appropriate to situation, with dementia, no behavioral issues  Patient Active Problem List   Diagnosis Date Noted  . Chronic right shoulder pain 02/09/2016  . Bilateral lower extremity edema 02/02/2016  . Ileitis 01/19/2016  . Pressure ulcer 01/09/2016  . SBO (small bowel obstruction) 01/08/2016  . Narcotic addiction ([place]) 05/15/2015  . Diarrhea 03/30/2015  . Acute pharyngitis 02/26/2015  . Osteoarthritis of right knee 02/22/2015  . Pain in joint, lower leg 10/31/2014  . Ulcer of sacral  region, stage 4 ([place]) 04/15/2014  . Moderate aortic stenosis 04/14/2014  . Pre-syncope 04/14/2014  . Hypotension 04/14/2014  . Acute on chronic renal failure ([place]) 04/14/2014  . Anemia 04/14/2014  . Protein-calorie malnutrition, severe ([place]) 04/14/2014  . Altered mental status   . Dehydration 04/13/2014  . Altered mental state 04/13/2014  . Acute kidney injury ([place])   . Hypokalemia 03/25/2014  . Syncope 03/16/2014  . Acute encephalopathy 03/16/2014  . Leg swelling 03/02/2014  . Avascular necrosis of bone of right hip ([place]) 09/27/2013  . Primary osteoarthritis of right hip 09/07/2013  . Spinal stenosis, lumbar region, with neurogenic claudication 09/07/2013  . Closed fracture of pubic ramus ([place]) 09/07/2013  . BPPV (benign paroxysmal positional vertigo) 09/07/2013  . Pharyngeal swelling 08/30/2013  . Pelvic fracture ([place]) 08/28/2013  . Fall 08/26/2013  . Dizziness 08/26/2013  . Abnormal stress test 08/26/2013  . Preop cardiovascular exam 08/01/2013  . Aortic stenosis 08/01/2013  . Edema 06/30/2013  . Loss of weight 01/04/2013  . Situational stress 01/04/2013  . Chronic right hip pain 07/05/2012  . Knee pain, acute 06/29/2012  .  Vertigo 06/24/2012  . Acute exacerbation of chronic low back pain 06/24/2012  . Lumbar pain with radiation down right leg 06/09/2012  . Osteoarthritis of hip 04/29/2012  . History of TIAs 04/17/2012  . S/P total hysterectomy and bilateral salpingo-oophorectomy 04/17/2012  . Hard of hearing 04/17/2012  . History of anemia 04/17/2012  . RLS (restless legs syndrome) 04/17/2012  . Chronic back pain 04/17/2012  . Spinal stenosis, lumbar 04/17/2012  . Lower back pain 12/20/2011  . Personal history of colonic polyps 10/08/2011  . H/O: CVA (cardiovascular accident) 09/14/2011  . Essential hypertension 09/14/2011  . Restless legs syndrome 09/14/2011  . Constipation 08/20/2011  . Urinary incontinence 08/20/2011    CMP     Component Value Date/Time   NA 138 06/13/2016   K 4.1 06/13/2016   CL 101 01/12/2016 0536   CO2 27 01/12/2016 0536   GLUCOSE 120 (H) 01/12/2016 0536   BUN 36 (A) 06/13/2016   CREATININE 1.4 (A) 06/13/2016   CREATININE 0.51 01/12/2016 0536   CREATININE 0.78 06/28/2014 1205   CALCIUM 8.3 (L) 01/12/2016 0536   PROT 7.6 01/08/2016 0412   ALBUMIN 4.4 01/08/2016 0412   AST 21 03/19/2016   ALT 12 03/19/2016   ALKPHOS 104 03/19/2016   BILITOT 0.5 01/08/2016 0412   GFRNONAA >60 01/12/2016 0536   GFRAA >60 01/12/2016 0536    Recent Labs  01/10/16 [id number]  01/11/16 0751  01/12/16 0536 02/04/16 03/19/16 06/13/16  NA 143  < > 137  < > 135 138 136* 138  K 3.3*  < > 3.0*  --  3.7 4.8 4.6 4.1  CL 107  --  102  --  101  --   --   --   CO2 27  --  26  --  27  --   --   --   GLUCOSE 133*  --  133*  --  120*  --   --   --   BUN 29*  < > 28*  < > 23* 34* 40* 36*  CREATININE 0.71  < > 0.74  < > 0.51 1.0 1.2* 1.4*  CALCIUM 8.8*  --  8.4*  --  8.3*  --   --   --   MG 2.0  --   --   --   --   --   --   --   < > =  values in this interval not displayed.  Recent Labs  12/31/15 01/08/16 0412 03/19/16  AST 17 21 21   ALT 10 15 12   ALKPHOS 99 109 104  BILITOT  --  0.5  --     PROT  --  7.6  --   ALBUMIN  --  4.4  --     Recent Labs  01/08/16 0412  01/10/16 0838  01/11/16 0751 01/12/16 01/12/16 0536 03/19/16  WBC 26.4*  < > 15.1*  < > 12.6* 9.4 9.4 8.0  NEUTROABS 24.0*  --   --   --   --   --   --   --   HGB 12.2  < > 11.9*  < > 10.9*  --  10.5* 9.6*  HCT 39.2  < > 36.9  < > 33.5*  --  32.6* 30*  MCV 86.5  < > 85.2  --  85.0  --  83.8  --   PLT 336  < > 311  < > 307  --  250 278  < > = values in this interval not displayed.  Recent Labs  03/19/16  CHOL 141  LDLCALC 71  TRIG 77   No results found for: [facility] Lab Results  Component Value Date   TSH 2.09 03/19/2016   Lab Results  Component Value Date   HGBA1C 5.6 03/19/2016   Lab Results  Component Value Date   CHOL 141 03/19/2016   HDL 55 03/19/2016   LDLCALC 71 03/19/2016   TRIG 77 03/19/2016   CHOLHDL 2.8 03/06/2014    Significant Diagnostic Results in last 30 days:  No results found.  Assessment and Plan  SKIN ULCER OF BUTTOCKS WITH LIMITED BREAKDOWN OF SKIN/ SWELLING R BUTTOCKS - wound care to follow    Time spent > 25 min Albert Devaul D. [name], MD

## 2016-07-17 ENCOUNTER — Non-Acute Institutional Stay (SKILLED_NURSING_FACILITY): Payer: Medicare Other | Admitting: Internal Medicine

## 2016-07-17 ENCOUNTER — Encounter: Payer: Self-pay | Admitting: Internal Medicine

## 2016-07-17 DIAGNOSIS — L089 Local infection of the skin and subcutaneous tissue, unspecified: Secondary | ICD-10-CM

## 2016-07-17 DIAGNOSIS — L720 Epidermal cyst: Secondary | ICD-10-CM | POA: Diagnosis not present

## 2016-07-17 NOTE — Progress Notes (Signed)
Location:  [facility] Room Number: 303D Place of Service:  SNF [phone])  [name], MD  Patient Care Team: Lanice Shirts, MD as PCP - General (Internal Medicine) [name], MD as Consulting Physician (Orthopedic Surgery) [name], MD as Consulting Physician (Otolaryngology)  Extended Emergency Contact Information Primary Emergency Contact: [name] Address: [place], [place] [address] [name] of [place] Phone: [phone] Relation: Daughter Secondary Emergency Contact: [place], [place] of [place] Mobile Phone: [phone] Relation: Son    Allergies: Amoxicillin; Baclofen; Hctz [hydrochlorothiazide]; and Valium [diazepam]  Chief Complaint  Patient presents with  . Acute Visit    Acute    HPI: Patient is 83 y.o. female who is being seen for a cyst on her back noted several days ago. Then it was reported over the phone that it was without redness. Today with redness but no cellulitis. Pt admits it is tender to palpation.  Past Medical History:  Diagnosis Date  . Acute encephalopathy   . AKI (acute kidney injury) ([place])   . Altered mental state   . Anemia   . Aortic stenosis   . Arthritis    Back, knees  . Bowel incontinence   . Colon polyps   . Constipation   . Gastric ulcer    years ago  . Hypertension   . Hypokalemia   . Restless legs syndrome    on Requip  . Situational stress   . Spinal stenosis   . Stroke ([place])    Mini, no residual  . TIA (transient ischemic attack)   . Ulcer causing bleeding and hole in wall of stomach or small intestine   . Urinary bladder calculus   . Urinary incontinence   . Vertigo     Past Surgical History:  Procedure Laterality Date  . COLON RESECTION  50 years ago  . LUMBAR LAMINECTOMY/DECOMPRESSION MICRODISCECTOMY  06/25/2011   Procedure: LUMBAR LAMINECTOMY/DECOMPRESSION MICRODISCECTOMY;   Surgeon: Hosie Spangle, MD;  Location: [facility];  Service: Neurosurgery;  Laterality: Right;  RIGHT Lumbar Two-Three hemilaminectomy and microdiskectomy  . TONSILLECTOMY    . UTERINE FIBROID SURGERY    . VAGINAL HYSTERECTOMY      Allergies as of 07/17/2016      Reactions   Amoxicillin Other (See Comments)   Reaction unknown   Baclofen Other (See Comments)   "fuzzy in the eyes" and dizzy   Hctz [hydrochlorothiazide]    nausea   Valium [diazepam] Other (See Comments)   Pt became unresponsive and O2 sats dropped.      Medication List       Accurate as of 07/17/16  3:41 PM. Always use your most recent med list.          amLODipine 5 MG tablet Commonly known as:  NORVASC Take 5 mg by mouth daily.   CHLORASEPTIC MAX SORE THROAT 1.5-33 % Liqd Generic drug:  Phenol-Glycerin Use as directed 1 spray in the mouth or throat 3 (three) times daily as needed. For throat irritation/pain. If sore throat persist notify the MD   feeding supplement Liqd Take 1 Container by mouth 2 (two) times daily between meals.   furosemide 80 MG tablet Commonly known as:  LASIX Take 80 mg by mouth every morning.   HYDROcodone-acetaminophen 7.5-325 MG tablet Commonly known as:  NORCO Take 1  tablet by mouth 4 (four) times daily as needed for moderate pain.   lubiprostone 24 MCG capsule Commonly known as:  AMITIZA Take 24 mcg by mouth 2 (two) times daily with a meal.   magnesium hydroxide 400 MG/5ML suspension Commonly known as:  MILK OF MAGNESIA Take 30 mLs by mouth daily as needed for mild constipation. Reported on 07/27/2015   mirtazapine 15 MG tablet Commonly known as:  REMERON Take 7.5 mg by mouth at bedtime.   morphine 15 MG 12 hr tablet Commonly known as:  MS CONTIN Take 15 mg by mouth 2 (two) times daily.   multivitamin tablet Take 1 tablet by mouth daily.   ondansetron 4 MG tablet Commonly known as:  ZOFRAN Take 4 mg by mouth every 12 (twelve) hours as needed for nausea or  vomiting.   pantoprazole 40 MG tablet Commonly known as:  PROTONIX Take 40 mg by mouth daily.   potassium chloride 20 MEQ packet Commonly known as:  KLOR-CON Take 20 mEq by mouth 2 (two) times daily.   rOPINIRole 4 MG tablet Commonly known as:  REQUIP Take 4 mg by mouth at bedtime.   SYSTANE BALANCE 0.6 % Soln Generic drug:  Propylene Glycol Place 1 drop into both eyes 2 (two) times daily.       No orders of the defined types were placed in this encounter.   Immunization History  Administered Date(s) Administered  . Influenza Split 04/15/2012  . Influenza,inj,Quad PF,36+ Mos 03/06/2014  . Influenza-Unspecified 03/01/2016  . PPD Test 08/30/2014, 09/18/2014  . Pneumococcal Polysaccharide-23 08/29/2013    Social History  Substance Use Topics  . Smoking status: Never Smoker  . Smokeless tobacco: Never Used  . Alcohol use 0.0 oz/week     Comment: rarely    Review of Systems  DATA OBTAINED: from patient and nursing GENERAL:  no fevers, fatigue, appetite changes SKIN: tender swelling on back HEENT: No complaint RESPIRATORY: No cough, wheezing, SOB CARDIAC: No chest pain, palpitations, lower extremity edema  GI: No abdominal pain, No N/V/D or constipation, No heartburn or reflux  GU: No dysuria, frequency or urgency, or incontinence  MUSCULOSKELETAL: No unrelieved bone/joint pain NEUROLOGIC: No headache, dizziness  PSYCHIATRIC: No overt anxiety or sadness  Vitals:   07/17/16 1539  BP: 122/61  Pulse: 78  Resp: 20  Temp: 98.4 F (36.9 C)   Body mass index is 19.76 kg/m. Physical Exam  GENERAL APPEARANCE: Alert, conversant, No acute distress  SKIN: approx 2 cm by 2cm fluctuant swelling R back; rednes to swelling but no surrounding erythema HEENT: Unremarkable RESPIRATORY: Breathing is even, unlabored. Lung sounds are clear   CARDIOVASCULAR: Heart RRR no murmurs, rubs or gallops. 1+ peripheral edema  GASTROINTESTINAL: Abdomen is soft, non-tender, not  distended w/ normal bowel sounds.  GENITOURINARY: Bladder non tender, not distended  MUSCULOSKELETAL: No abnormal joints or musculature NEUROLOGIC: Cranial nerves 2-12 grossly intact. Moves all extremities PSYCHIATRIC: Mood and affect appropriate to situation with some dementia, no behavioral issues  Patient Active Problem List   Diagnosis Date Noted  . Chronic right shoulder pain 02/09/2016  . Bilateral lower extremity edema 02/02/2016  . Ileitis 01/19/2016  . Pressure ulcer 01/09/2016  . SBO (small bowel obstruction) 01/08/2016  . Narcotic addiction ([place]) 05/15/2015  . Diarrhea 03/30/2015  . Acute pharyngitis 02/26/2015  . Osteoarthritis of right knee 02/22/2015  . Pain in joint, lower leg 10/31/2014  . Ulcer of sacral region, stage 4 ([place]) 04/15/2014  . Moderate aortic stenosis 04/14/2014  .  Pre-syncope 04/14/2014  . Hypotension 04/14/2014  . Acute on chronic renal failure ([place]) 04/14/2014  . Anemia 04/14/2014  . Protein-calorie malnutrition, severe ([place]) 04/14/2014  . Altered mental status   . Dehydration 04/13/2014  . Altered mental state 04/13/2014  . Acute kidney injury ([place])   . Hypokalemia 03/25/2014  . Syncope 03/16/2014  . Acute encephalopathy 03/16/2014  . Leg swelling 03/02/2014  . Avascular necrosis of bone of right hip ([place]) 09/27/2013  . Primary osteoarthritis of right hip 09/07/2013  . Spinal stenosis, lumbar region, with neurogenic claudication 09/07/2013  . Closed fracture of pubic ramus ([place]) 09/07/2013  . BPPV (benign paroxysmal positional vertigo) 09/07/2013  . Pharyngeal swelling 08/30/2013  . Pelvic fracture ([place]) 08/28/2013  . Fall 08/26/2013  . Dizziness 08/26/2013  . Abnormal stress test 08/26/2013  . Preop cardiovascular exam 08/01/2013  . Aortic stenosis 08/01/2013  . Edema 06/30/2013  . Loss of weight 01/04/2013  . Situational stress 01/04/2013  . Chronic right hip pain 07/05/2012  . Knee pain, acute 06/29/2012  . Vertigo 06/24/2012  .  Acute exacerbation of chronic low back pain 06/24/2012  . Lumbar pain with radiation down right leg 06/09/2012  . Osteoarthritis of hip 04/29/2012  . History of TIAs 04/17/2012  . S/P total hysterectomy and bilateral salpingo-oophorectomy 04/17/2012  . Hard of hearing 04/17/2012  . History of anemia 04/17/2012  . RLS (restless legs syndrome) 04/17/2012  . Chronic back pain 04/17/2012  . Spinal stenosis, lumbar 04/17/2012  . Lower back pain 12/20/2011  . Personal history of colonic polyps 10/08/2011  . H/O: CVA (cardiovascular accident) 09/14/2011  . Essential hypertension 09/14/2011  . Restless legs syndrome 09/14/2011  . Constipation 08/20/2011  . Urinary incontinence 08/20/2011    CMP     Component Value Date/Time   NA 138 06/13/2016   K 4.1 06/13/2016   CL 101 01/12/2016 0536   CO2 27 01/12/2016 0536   GLUCOSE 120 (H) 01/12/2016 0536   BUN 36 (A) 06/13/2016   CREATININE 1.4 (A) 06/13/2016   CREATININE 0.51 01/12/2016 0536   CREATININE 0.78 06/28/2014 1205   CALCIUM 8.3 (L) 01/12/2016 0536   PROT 7.6 01/08/2016 0412   ALBUMIN 4.4 01/08/2016 0412   AST 16 06/04/2016   ALT 6 (A) 06/04/2016   ALKPHOS 109 06/04/2016   BILITOT 0.5 01/08/2016 0412   GFRNONAA >60 01/12/2016 0536   GFRAA >60 01/12/2016 0536    Recent Labs  01/10/16 [id number]  01/11/16 0751  01/12/16 0536  04/26/16 06/04/16 06/13/16  NA 143  < > 137  < > 135  < > 141 140 138  K 3.3*  < > 3.0*  --  3.7  < > 3.8 4.4 4.1  CL 107  --  102  --  101  --   --   --   --   CO2 27  --  26  --  27  --   --   --   --   GLUCOSE 133*  --  133*  --  120*  --   --   --   --   BUN 29*  < > 28*  < > 23*  < > 23* 34* 36*  CREATININE 0.71  < > 0.74  < > 0.51  < > 1.1 1.3* 1.4*  CALCIUM 8.8*  --  8.4*  --  8.3*  --   --   --   --   MG 2.0  --   --   --   --   --   --   --   --   < > =  values in this interval not displayed.  Recent Labs  01/08/16 0412 03/19/16 06/04/16  AST 21 21 16   ALT 15 12 6*  ALKPHOS 109 104 109    BILITOT 0.5  --   --   PROT 7.6  --   --   ALBUMIN 4.4  --   --     Recent Labs  01/08/16 0412  01/10/16 0838  01/11/16 0751  01/12/16 0536 03/19/16 04/26/16  WBC 26.4*  < > 15.1*  < > 12.6*  < > 9.4 8.0 6.5  NEUTROABS 24.0*  --   --   --   --   --   --   --   --   HGB 12.2  < > 11.9*  < > 10.9*  --  10.5* 9.6* 8.5*  HCT 39.2  < > 36.9  < > 33.5*  --  32.6* 30* 27*  MCV 86.5  < > 85.2  --  85.0  --  83.8  --   --   PLT 336  < > 311  < > 307  --  250 278 266  < > = values in this interval not displayed.  Recent Labs  03/19/16  CHOL 141  LDLCALC 71  TRIG 77   No results found for: [facility] Lab Results  Component Value Date   TSH 2.46 04/26/2016   Lab Results  Component Value Date   HGBA1C 5.6 03/19/2016   Lab Results  Component Value Date   CHOL 141 03/19/2016   HDL 55 03/19/2016   LDLCALC 71 03/19/2016   TRIG 77 03/19/2016   CHOLHDL 2.8 03/06/2014    Significant Diagnostic Results in last 30 days:  No results found.  Assessment and Plan  INFECTED CYST R BACK  - NEEDS I AND D  PROCEDURE I and D COMPLICATED- after sterile prep with betadine and skin infused with lidocaine and open with 11 blade with return of cyst material and small amt pus;no odor ; packed with iodiform guaze which is to stay in 3 days before it is pulled; dress with absorbant dressing; pt put on Doxy 100 mg BID for 5 days      [name], MD

## 2016-07-25 ENCOUNTER — Non-Acute Institutional Stay (SKILLED_NURSING_FACILITY): Payer: Medicare Other | Admitting: Internal Medicine

## 2016-07-25 DIAGNOSIS — L89311 Pressure ulcer of right buttock, stage 1: Secondary | ICD-10-CM

## 2016-07-28 ENCOUNTER — Encounter: Payer: Self-pay | Admitting: Internal Medicine

## 2016-07-28 NOTE — Progress Notes (Signed)
Location:  [facility] Room Number: 303D Place of Service:  SNF [phone])  [name], MD  Patient Care Team: Lanice Shirts, MD as PCP - General (Internal Medicine) [name], MD as Consulting Physician (Orthopedic Surgery) [name], MD as Consulting Physician (Otolaryngology)  Extended Emergency Contact Information Primary Emergency Contact: [name] Address: [place], [place] [address] [name] of [place] Phone: [phone] Relation: Daughter Secondary Emergency Contact: [place], [place] of [place] Mobile Phone: [phone] Relation: Son    Allergies: Amoxicillin; Baclofen; Hctz [hydrochlorothiazide]; and Valium [diazepam]  Chief Complaint  Patient presents with  . Acute Visit    Acute    HPI: Patient is 83 y.o. female who is being seen because the nurses report that pt has a swelling on her R buttocks that is an abscess and need to be drained. Pt admits it has some pain. Last week a pen was found under R side of pt's WC seat. Pt has had no fever or other systemic sx.  Past Medical History:  Diagnosis Date  . Acute encephalopathy   . AKI (acute kidney injury) ([place])   . Altered mental state   . Anemia   . Aortic stenosis   . Arthritis    Back, knees  . Bowel incontinence   . Colon polyps   . Constipation   . Gastric ulcer    years ago  . Hypertension   . Hypokalemia   . Restless legs syndrome    on Requip  . Situational stress   . Spinal stenosis   . Stroke ([place])    Mini, no residual  . TIA (transient ischemic attack)   . Ulcer causing bleeding and hole in wall of stomach or small intestine   . Urinary bladder calculus   . Urinary incontinence   . Vertigo     Past Surgical History:  Procedure Laterality Date  . COLON RESECTION  50 years ago  . LUMBAR LAMINECTOMY/DECOMPRESSION MICRODISCECTOMY  06/25/2011   Procedure: LUMBAR  LAMINECTOMY/DECOMPRESSION MICRODISCECTOMY;  Surgeon: Hosie Spangle, MD;  Location: [facility];  Service: Neurosurgery;  Laterality: Right;  RIGHT Lumbar Two-Three hemilaminectomy and microdiskectomy  . TONSILLECTOMY    . UTERINE FIBROID SURGERY    . VAGINAL HYSTERECTOMY      Allergies as of 07/25/2016      Reactions   Amoxicillin Other (See Comments)   Reaction unknown   Baclofen Other (See Comments)   "fuzzy in the eyes" and dizzy   Hctz [hydrochlorothiazide]    nausea   Valium [diazepam] Other (See Comments)   Pt became unresponsive and O2 sats dropped.      Medication List       Accurate as of 07/25/16 11:59 PM. Always use your most recent med list.          acetaminophen 325 MG tablet Commonly known as:  TYLENOL Take 650 mg by mouth every 6 (six) hours as needed. Notify MD if not relieved. Not to exceed 3000 mg in 24 hour period   amLODipine 5 MG tablet Commonly known as:  NORVASC Take 5 mg by mouth daily.   CHLORASEPTIC MAX SORE THROAT 1.5-33 % Liqd Generic drug:  Phenol-Glycerin Use as directed 1 spray in the mouth or throat 3 (three) times daily as needed. For throat irritation/pain. If sore throat persist  notify the MD   clindamycin 300 MG capsule Commonly known as:  CLEOCIN Take 300 mg by mouth every 6 (six) hours.   feeding supplement Liqd Take 1 Container by mouth 2 (two) times daily between meals.   HYDROcodone-acetaminophen 7.5-325 MG tablet Commonly known as:  NORCO Take 1 tablet by mouth 4 (four) times daily as needed for moderate pain.   lubiprostone 24 MCG capsule Commonly known as:  AMITIZA Take 24 mcg by mouth 2 (two) times daily with a meal.   magnesium hydroxide 400 MG/5ML suspension Commonly known as:  MILK OF MAGNESIA Take 30 mLs by mouth daily as needed for mild constipation. Reported on 07/27/2015   mirtazapine 15 MG tablet Commonly known as:  REMERON Take 15 mg by mouth at bedtime.   morphine 15 MG 12 hr tablet Commonly known as:   MS CONTIN Take 15 mg by mouth 2 (two) times daily.   multivitamin tablet Take 1 tablet by mouth daily.   MYRBETRIQ 50 MG Tb24 tablet Generic drug:  mirabegron ER Take 50 mg by mouth daily.   ondansetron 4 MG tablet Commonly known as:  ZOFRAN Take 4 mg by mouth every 12 (twelve) hours as needed for nausea or vomiting.   pantoprazole 40 MG tablet Commonly known as:  PROTONIX Take 40 mg by mouth daily.   potassium chloride 10 MEQ tablet Commonly known as:  K-DUR Take 20 mEq by mouth daily. 2 tablets   rOPINIRole 4 MG tablet Commonly known as:  REQUIP Take 4 mg by mouth at bedtime.   SYSTANE BALANCE 0.6 % Soln Generic drug:  Propylene Glycol Place 1 drop into both eyes 2 (two) times daily.   torsemide 20 MG tablet Commonly known as:  DEMADEX Take 40 mg by mouth every morning. 2 tabs       No orders of the defined types were placed in this encounter.   Immunization History  Administered Date(s) Administered  . Influenza Split 04/15/2012  . Influenza,inj,Quad PF,36+ Mos 03/06/2014  . Influenza-Unspecified 03/01/2016  . PPD Test 08/30/2014, 09/18/2014  . Pneumococcal Polysaccharide-23 08/29/2013    Social History  Substance Use Topics  . Smoking status: Never Smoker  . Smokeless tobacco: Never Used  . Alcohol use 0.0 oz/week     Comment: rarely    Review of Systems  DATA OBTAINED: from patient, nurse - as per HPI GENERAL:  no fevers, fatigue, appetite changes SKIN: as per HPI HEENT: No complaint RESPIRATORY: No cough, wheezing, SOB CARDIAC: No chest pain, palpitations, lower extremity edema  GI: No abdominal pain, No N/V/D or constipation, No heartburn or reflux  GU: No dysuria, frequency or urgency, or incontinence  MUSCULOSKELETAL: No unrelieved bone/joint pain NEUROLOGIC: No headache, dizziness  PSYCHIATRIC: No overt anxiety or sadness  Vitals:   07/25/16 0825  BP: 138/68  Pulse: 74  Resp: 18  Temp: 98.5 F (36.9 C)   Body mass index is  19.14 kg/m. Physical Exam  GENERAL APPEARANCE: Alert, conversant, No acute distress  SKIN: R buttocks - about  2 cm skin breakdown , stage 1 with smaller central eschar; no heat, redness or fluctuance HEENT: Unremarkable RESPIRATORY: Breathing is even, unlabored. Lung sounds are clear   CARDIOVASCULAR: Heart RRR no murmurs, rubs or gallops. No peripheral edema  GASTROINTESTINAL: Abdomen is soft, non-tender, not distended w/ normal bowel sounds.  GENITOURINARY: Bladder non tender, not distended  MUSCULOSKELETAL: No abnormal joints or musculature NEUROLOGIC: Cranial nerves 2-12 grossly intact. Moves all extremities PSYCHIATRIC: Mood and affect  appropriate to situation, no behavioral issues  Patient Active Problem List   Diagnosis Date Noted  . Chronic right shoulder pain 02/09/2016  . Bilateral lower extremity edema 02/02/2016  . Ileitis 01/19/2016  . Pressure ulcer 01/09/2016  . SBO (small bowel obstruction) 01/08/2016  . Narcotic addiction ([place]) 05/15/2015  . Diarrhea 03/30/2015  . Acute pharyngitis 02/26/2015  . Osteoarthritis of right knee 02/22/2015  . Pain in joint, lower leg 10/31/2014  . Ulcer of sacral region, stage 4 ([place]) 04/15/2014  . Moderate aortic stenosis 04/14/2014  . Pre-syncope 04/14/2014  . Hypotension 04/14/2014  . Acute on chronic renal failure ([place]) 04/14/2014  . Anemia 04/14/2014  . Protein-calorie malnutrition, severe ([place]) 04/14/2014  . Altered mental status   . Dehydration 04/13/2014  . Altered mental state 04/13/2014  . Acute kidney injury ([place])   . Hypokalemia 03/25/2014  . Syncope 03/16/2014  . Acute encephalopathy 03/16/2014  . Leg swelling 03/02/2014  . Avascular necrosis of bone of right hip ([place]) 09/27/2013  . Primary osteoarthritis of right hip 09/07/2013  . Spinal stenosis, lumbar region, with neurogenic claudication 09/07/2013  . Closed fracture of pubic ramus ([place]) 09/07/2013  . BPPV (benign paroxysmal positional vertigo) 09/07/2013    . Pharyngeal swelling 08/30/2013  . Pelvic fracture ([place]) 08/28/2013  . Fall 08/26/2013  . Dizziness 08/26/2013  . Abnormal stress test 08/26/2013  . Preop cardiovascular exam 08/01/2013  . Aortic stenosis 08/01/2013  . Edema 06/30/2013  . Loss of weight 01/04/2013  . Situational stress 01/04/2013  . Chronic right hip pain 07/05/2012  . Knee pain, acute 06/29/2012  . Vertigo 06/24/2012  . Acute exacerbation of chronic low back pain 06/24/2012  . Lumbar pain with radiation down right leg 06/09/2012  . Osteoarthritis of hip 04/29/2012  . History of TIAs 04/17/2012  . S/P total hysterectomy and bilateral salpingo-oophorectomy 04/17/2012  . Hard of hearing 04/17/2012  . History of anemia 04/17/2012  . RLS (restless legs syndrome) 04/17/2012  . Chronic back pain 04/17/2012  . Spinal stenosis, lumbar 04/17/2012  . Lower back pain 12/20/2011  . Personal history of colonic polyps 10/08/2011  . H/O: CVA (cardiovascular accident) 09/14/2011  . Essential hypertension 09/14/2011  . Restless legs syndrome 09/14/2011  . Constipation 08/20/2011  . Urinary incontinence 08/20/2011    CMP     Component Value Date/Time   NA 138 06/13/2016   K 4.1 06/13/2016   CL 101 01/12/2016 0536   CO2 27 01/12/2016 0536   GLUCOSE 120 (H) 01/12/2016 0536   BUN 36 (A) 06/13/2016   CREATININE 1.4 (A) 06/13/2016   CREATININE 0.51 01/12/2016 0536   CREATININE 0.78 06/28/2014 1205   CALCIUM 8.3 (L) 01/12/2016 0536   PROT 7.6 01/08/2016 0412   ALBUMIN 4.4 01/08/2016 0412   AST 16 06/04/2016   ALT 6 (A) 06/04/2016   ALKPHOS 109 06/04/2016   BILITOT 0.5 01/08/2016 0412   GFRNONAA >60 01/12/2016 0536   GFRAA >60 01/12/2016 0536    Recent Labs  01/10/16 0838  01/11/16 0751  01/12/16 0536  04/26/16 06/04/16 06/13/16  NA 143  < > 137  < > 135  < > 141 140 138  K 3.3*  < > 3.0*  --  3.7  < > 3.8 4.4 4.1  CL 107  --  102  --  101  --   --   --   --   CO2 27  --  26  --  27  --   --   --   --  GLUCOSE 133*  --  133*  --  120*  --   --   --   --   BUN 29*  < > 28*  < > 23*  < > 23* 34* 36*  CREATININE 0.71  < > 0.74  < > 0.51  < > 1.1 1.3* 1.4*  CALCIUM 8.8*  --  8.4*  --  8.3*  --   --   --   --   MG 2.0  --   --   --   --   --   --   --   --   < > = values in this interval not displayed.  Recent Labs  01/08/16 0412 03/19/16 06/04/16  AST 21 21 16   ALT 15 12 6*  ALKPHOS 109 104 109  BILITOT 0.5  --   --   PROT 7.6  --   --   ALBUMIN 4.4  --   --     Recent Labs  01/08/16 0412  01/10/16 0838  01/11/16 0751  01/12/16 0536 03/19/16 04/26/16  WBC 26.4*  < > 15.1*  < > 12.6*  < > 9.4 8.0 6.5  NEUTROABS 24.0*  --   --   --   --   --   --   --   --   HGB 12.2  < > 11.9*  < > 10.9*  --  10.5* 9.6* 8.5*  HCT 39.2  < > 36.9  < > 33.5*  --  32.6* 30* 27*  MCV 86.5  < > 85.2  --  85.0  --  83.8  --   --   PLT 336  < > 311  < > 307  --  250 278 266  < > = values in this interval not displayed.  Recent Labs  03/19/16  CHOL 141  LDLCALC 71  TRIG 77   No results found for: [facility] Lab Results  Component Value Date   TSH 2.46 04/26/2016   Lab Results  Component Value Date   HGBA1C 5.6 03/19/2016   Lab Results  Component Value Date   CHOL 141 03/19/2016   HDL 55 03/19/2016   LDLCALC 71 03/19/2016   TRIG 77 03/19/2016   CHOLHDL 2.8 03/06/2014    Significant Diagnostic Results in last 30 days:  No results found.  Assessment and Plan -  Pressure ulcer R buttocks stage 1 - wound care to see pt and follow  Time spent > 25 min Anne D. [name], MD

## 2016-08-01 ENCOUNTER — Encounter: Payer: Self-pay | Admitting: Internal Medicine

## 2016-08-01 NOTE — Assessment & Plan Note (Signed)
Continues to be o  Morphine 15 mg q 12 and prn oxycodone; pt never asked me for pain medications or c/o pain

## 2016-08-01 NOTE — Assessment & Plan Note (Signed)
Present but has improved a lot with demadex increase; pt wears TED hose at times; will cont to monitor

## 2016-08-01 NOTE — Assessment & Plan Note (Signed)
Adequately controlled with requip 4 mg q HS

## 2016-08-02 ENCOUNTER — Encounter: Payer: Self-pay | Admitting: Internal Medicine

## 2016-08-04 ENCOUNTER — Non-Acute Institutional Stay (SKILLED_NURSING_FACILITY): Payer: Medicare Other | Admitting: Internal Medicine

## 2016-08-04 DIAGNOSIS — R05 Cough: Secondary | ICD-10-CM | POA: Diagnosis not present

## 2016-08-04 DIAGNOSIS — G4734 Idiopathic sleep related nonobstructive alveolar hypoventilation: Secondary | ICD-10-CM | POA: Diagnosis not present

## 2016-08-04 DIAGNOSIS — R059 Cough, unspecified: Secondary | ICD-10-CM

## 2016-08-04 LAB — BASIC METABOLIC PANEL
BUN: 36 mg/dL — AB (ref 4–21)
BUN: 36 mg/dL — AB (ref 4–21)
CREATININE: 1.3 mg/dL — AB (ref 0.5–1.1)
CREATININE: 1.3 mg/dL — AB (ref 0.5–1.1)
GLUCOSE: 155 mg/dL
Glucose: 155 mg/dL
POTASSIUM: 5 mmol/L (ref 3.4–5.3)
POTASSIUM: 5 mmol/L (ref 3.4–5.3)
SODIUM: 138 mmol/L (ref 137–147)
Sodium: 138 mmol/L (ref 137–147)

## 2016-08-04 LAB — CBC AND DIFFERENTIAL
HCT: 30 % — AB (ref 36–46)
Hemoglobin: 9.1 g/dL — AB (ref 12.0–16.0)
PLATELETS: 262 10*3/uL (ref 150–399)
WBC: 8.4 10*3/mL

## 2016-08-06 ENCOUNTER — Encounter: Payer: Self-pay | Admitting: Internal Medicine

## 2016-08-06 NOTE — Progress Notes (Signed)
Location:  [facility] Room Number: 303D Place of Service:  SNF [phone])  [name], MD  Patient Care Team: Lanice Shirts, MD as PCP - General (Internal Medicine) [name], MD as Consulting Physician (Orthopedic Surgery) [name], MD as Consulting Physician (Otolaryngology)  Extended Emergency Contact Information Primary Emergency Contact: [name] Address: [place], [place] [address] [name] of [place] Phone: [phone] Relation: Daughter Secondary Emergency Contact: [place], [place] of [place] Mobile Phone: [phone] Relation: Son    Allergies: Amoxicillin; Baclofen; Hctz [hydrochlorothiazide]; and Valium [diazepam]  Chief Complaint  Patient presents with  . Acute Visit    HPI: Patient is 83 y.o. female who is being seen acutely because an outbreak of Influenza A per CDC guidelines was recognized on 06/25/2016. Pt has no c/o flu like symptoms;therefore pt will need to be prophylaxed with Tamiflu for a minimum of 14 days per CDC protocol.  Past Medical History:  Diagnosis Date  . Acute encephalopathy   . AKI (acute kidney injury) ([place])   . Altered mental state   . Anemia   . Aortic stenosis   . Arthritis    Back, knees  . Bowel incontinence   . Colon polyps   . Constipation   . Gastric ulcer    years ago  . Hypertension   . Hypokalemia   . Restless legs syndrome    on Requip  . Situational stress   . Spinal stenosis   . Stroke ([place])    Mini, no residual  . TIA (transient ischemic attack)   . Ulcer causing bleeding and hole in wall of stomach or small intestine   . Urinary bladder calculus   . Urinary incontinence   . Vertigo     Past Surgical History:  Procedure Laterality Date  . COLON RESECTION  50 years ago  . LUMBAR LAMINECTOMY/DECOMPRESSION MICRODISCECTOMY  06/25/2011   Procedure: LUMBAR LAMINECTOMY/DECOMPRESSION  MICRODISCECTOMY;  Surgeon: Hosie Spangle, MD;  Location: [facility];  Service: Neurosurgery;  Laterality: Right;  RIGHT Lumbar Two-Three hemilaminectomy and microdiskectomy  . TONSILLECTOMY    . UTERINE FIBROID SURGERY    . VAGINAL HYSTERECTOMY      Allergies as of 06/26/2016      Reactions   Amoxicillin Other (See Comments)   Reaction unknown   Baclofen Other (See Comments)   "fuzzy in the eyes" and dizzy   Hctz [hydrochlorothiazide]    nausea   Valium [diazepam] Other (See Comments)   Pt became unresponsive and O2 sats dropped.      Medication List       Accurate as of 06/26/16 11:59 PM. Always use your most recent med list.          acetaminophen 325 MG tablet Commonly known as:  TYLENOL Take 650 mg by mouth every 6 (six) hours as needed. Notify MD if not relieved. Not to exceed 3000 mg in 24 hour period   amLODipine 5 MG tablet Commonly known as:  NORVASC Take 5 mg by mouth daily.   CHLORASEPTIC MAX SORE THROAT 1.5-33 % Liqd Generic drug:  Phenol-Glycerin Use as directed 1 spray in the mouth or throat 3 (three) times daily as needed. For throat irritation/pain. If sore throat persist notify the MD   feeding supplement Liqd Take 1 Container by mouth 2 (two) times  daily between meals.   lubiprostone 24 MCG capsule Commonly known as:  AMITIZA Take 24 mcg by mouth 2 (two) times daily with a meal.   magnesium hydroxide 400 MG/5ML suspension Commonly known as:  MILK OF MAGNESIA Take 30 mLs by mouth daily as needed for mild constipation. Reported on 07/27/2015   mirtazapine 15 MG tablet Commonly known as:  REMERON Take 15 mg by mouth at bedtime.   morphine 15 MG 12 hr tablet Commonly known as:  MS CONTIN Take 15 mg by mouth 2 (two) times daily.   multivitamin tablet Take 1 tablet by mouth daily.   MYRBETRIQ 50 MG Tb24 tablet Generic drug:  mirabegron ER Take 50 mg by mouth daily.   ondansetron 4 MG tablet Commonly known as:  ZOFRAN Take 4 mg by mouth  every 12 (twelve) hours as needed for nausea or vomiting.   pantoprazole 40 MG tablet Commonly known as:  PROTONIX Take 40 mg by mouth daily.   potassium chloride 10 MEQ tablet Commonly known as:  K-DUR Take 20 mEq by mouth daily. 2 tablets   rOPINIRole 4 MG tablet Commonly known as:  REQUIP Take 4 mg by mouth at bedtime.   SYSTANE BALANCE 0.6 % Soln Generic drug:  Propylene Glycol Place 1 drop into both eyes 2 (two) times daily.   torsemide 20 MG tablet Commonly known as:  DEMADEX Take 40 mg by mouth every morning. 2 tabs       No orders of the defined types were placed in this encounter.   Immunization History  Administered Date(s) Administered  . Influenza Split 04/15/2012  . Influenza,inj,Quad PF,36+ Mos 03/06/2014  . Influenza-Unspecified 03/01/2016  . PPD Test 08/30/2014, 09/18/2014  . Pneumococcal Polysaccharide-23 08/29/2013    Social History  Substance Use Topics  . Smoking status: Never Smoker  . Smokeless tobacco: Never Used  . Alcohol use 0.0 oz/week     Comment: rarely    Review of Systems  DATA OBTAINED: from patient, nurse GENERAL:  no fevers SKIN: No itching, rash HEENT: no rhinorrhea, congestion, ST or ear pain RESPIRATORY: No cough, wheezing, SOB CARDIAC: No chest pain, palpitations, lower extremity edema  GI: No abdominal pain, No N/V/D or constipation, No heartburn or reflux  MUSCULOSKELETAL: No muscle aches NEUROLOGIC: No headache, dizziness   Vitals:   06/26/16 1526  BP: (!) 108/58  Pulse: 70  Resp: 18  Temp: 98.2 F (36.8 C)   Body mass index is 18.67 kg/m. Physical Exam  GENERAL APPEARANCE: Alert, conversant, No acute distress  SKIN: No diaphoresis rash HEENT: Unremarkable RESPIRATORY: Breathing is even, unlabored. Lung sounds are clear   CARDIOVASCULAR: Heart RRR no murmurs, rubs or gallops. No peripheral edema  GASTROINTESTINAL: Abdomen is soft, non-tender, not distended w/ normal bowel sounds.   NEUROLOGIC: Cranial  nerves 2-12 grossly intact PSYCHIATRIC: baseline, no mental status changes  Patient Active Problem List   Diagnosis Date Noted  . Chronic right shoulder pain 02/09/2016  . Bilateral lower extremity edema 02/02/2016  . Ileitis 01/19/2016  . Pressure ulcer 01/09/2016  . SBO (small bowel obstruction) 01/08/2016  . Narcotic addiction ([place]) 05/15/2015  . Diarrhea 03/30/2015  . Acute pharyngitis 02/26/2015  . Osteoarthritis of right knee 02/22/2015  . Pain in joint, lower leg 10/31/2014  . Ulcer of sacral region, stage 4 ([place]) 04/15/2014  . Moderate aortic stenosis 04/14/2014  . Pre-syncope 04/14/2014  . Hypotension 04/14/2014  . Acute on chronic renal failure ([place]) 04/14/2014  . Anemia 04/14/2014  .  Protein-calorie malnutrition, severe ([place]) 04/14/2014  . Altered mental status   . Dehydration 04/13/2014  . Altered mental state 04/13/2014  . Acute kidney injury ([place])   . Hypokalemia 03/25/2014  . Syncope 03/16/2014  . Acute encephalopathy 03/16/2014  . Leg swelling 03/02/2014  . Avascular necrosis of bone of right hip ([place]) 09/27/2013  . Primary osteoarthritis of right hip 09/07/2013  . Spinal stenosis, lumbar region, with neurogenic claudication 09/07/2013  . Closed fracture of pubic ramus ([place]) 09/07/2013  . BPPV (benign paroxysmal positional vertigo) 09/07/2013  . Pharyngeal swelling 08/30/2013  . Pelvic fracture ([place]) 08/28/2013  . Fall 08/26/2013  . Dizziness 08/26/2013  . Abnormal stress test 08/26/2013  . Preop cardiovascular exam 08/01/2013  . Aortic stenosis 08/01/2013  . Edema 06/30/2013  . Loss of weight 01/04/2013  . Situational stress 01/04/2013  . Chronic right hip pain 07/05/2012  . Knee pain, acute 06/29/2012  . Vertigo 06/24/2012  . Acute exacerbation of chronic low back pain 06/24/2012  . Lumbar pain with radiation down right leg 06/09/2012  . Osteoarthritis of hip 04/29/2012  . History of TIAs 04/17/2012  . S/P total hysterectomy and bilateral  salpingo-oophorectomy 04/17/2012  . Hard of hearing 04/17/2012  . History of anemia 04/17/2012  . RLS (restless legs syndrome) 04/17/2012  . Chronic back pain 04/17/2012  . Spinal stenosis, lumbar 04/17/2012  . Lower back pain 12/20/2011  . Personal history of colonic polyps 10/08/2011  . H/O: CVA (cardiovascular accident) 09/14/2011  . Essential hypertension 09/14/2011  . Restless legs syndrome 09/14/2011  . Constipation 08/20/2011  . Urinary incontinence 08/20/2011    CMP     Component Value Date/Time   NA 138 06/13/2016   K 4.1 06/13/2016   CL 101 01/12/2016 0536   CO2 27 01/12/2016 0536   GLUCOSE 120 (H) 01/12/2016 0536   BUN 36 (A) 06/13/2016   CREATININE 1.4 (A) 06/13/2016   CREATININE 0.51 01/12/2016 0536   CREATININE 0.78 06/28/2014 1205   CALCIUM 8.3 (L) 01/12/2016 0536   PROT 7.6 01/08/2016 0412   ALBUMIN 4.4 01/08/2016 0412   AST 16 06/04/2016   ALT 6 (A) 06/04/2016   ALKPHOS 109 06/04/2016   BILITOT 0.5 01/08/2016 0412   GFRNONAA >60 01/12/2016 0536   GFRAA >60 01/12/2016 0536    Recent Labs  01/10/16 5277  01/11/16 0751  01/12/16 0536  04/26/16 06/04/16 06/13/16  NA 143  < > 137  < > 135  < > 141 140 138  K 3.3*  < > 3.0*  --  3.7  < > 3.8 4.4 4.1  CL 107  --  102  --  101  --   --   --   --   CO2 27  --  26  --  27  --   --   --   --   GLUCOSE 133*  --  133*  --  120*  --   --   --   --   BUN 29*  < > 28*  < > 23*  < > 23* 34* 36*  CREATININE 0.71  < > 0.74  < > 0.51  < > 1.1 1.3* 1.4*  CALCIUM 8.8*  --  8.4*  --  8.3*  --   --   --   --   MG 2.0  --   --   --   --   --   --   --   --   < > =  values in this interval not displayed.  Recent Labs  01/08/16 0412 03/19/16 06/04/16  AST 21 21 16   ALT 15 12 6*  ALKPHOS 109 104 109  BILITOT 0.5  --   --   PROT 7.6  --   --   ALBUMIN 4.4  --   --     Recent Labs  01/08/16 0412  01/10/16 0838  01/11/16 0751  01/12/16 0536 03/19/16 04/26/16  WBC 26.4*  < > 15.1*  < > 12.6*  < > 9.4 8.0 6.5    NEUTROABS 24.0*  --   --   --   --   --   --   --   --   HGB 12.2  < > 11.9*  < > 10.9*  --  10.5* 9.6* 8.5*  HCT 39.2  < > 36.9  < > 33.5*  --  32.6* 30* 27*  MCV 86.5  < > 85.2  --  85.0  --  83.8  --   --   PLT 336  < > 311  < > 307  --  250 278 266  < > = values in this interval not displayed.  Recent Labs  03/19/16  CHOL 141  LDLCALC 71  TRIG 77   No results found for: [facility] Lab Results  Component Value Date   TSH 2.46 04/26/2016   Lab Results  Component Value Date   HGBA1C 5.6 03/19/2016   Lab Results  Component Value Date   CHOL 141 03/19/2016   HDL 55 03/19/2016   LDLCALC 71 03/19/2016   TRIG 77 03/19/2016   CHOLHDL 2.8 03/06/2014    Significant Diagnostic Results in last 30 days:  No results found.  Assessment and Plan  EXPOSURE TO FLU/ INFLUENZA OUTBREAK AT SNF-   CrCl calculated by me-  19      Dose for 14 days- 30 mg q 48 hours Pt will be monitored daily for flu like symptoms                                                                                Webb Silversmith D. [name], MD

## 2016-08-08 ENCOUNTER — Non-Acute Institutional Stay (SKILLED_NURSING_FACILITY): Payer: Medicare Other | Admitting: Internal Medicine

## 2016-08-08 ENCOUNTER — Encounter: Payer: Self-pay | Admitting: Internal Medicine

## 2016-08-08 DIAGNOSIS — M25551 Pain in right hip: Secondary | ICD-10-CM

## 2016-08-08 DIAGNOSIS — G8929 Other chronic pain: Secondary | ICD-10-CM | POA: Diagnosis not present

## 2016-08-08 DIAGNOSIS — M25511 Pain in right shoulder: Secondary | ICD-10-CM | POA: Diagnosis not present

## 2016-08-08 DIAGNOSIS — D508 Other iron deficiency anemias: Secondary | ICD-10-CM

## 2016-08-08 NOTE — Progress Notes (Signed)
Location:  [facility] Room Number: 303D Place of Service:  SNF [phone])  Shelly Martin. [name], MD  Patient Care Team: [name], MD as PCP - General (Internal Medicine) [name], MD as Consulting Physician (Orthopedic Surgery) [name], MD as Consulting Physician (Otolaryngology)  Extended Emergency Contact Information Primary Emergency Contact: [name] Address: [place], [place] [address] [name] of [place] Phone: [phone] Relation: Daughter Secondary Emergency Contact: [place], [place] of [place] Mobile Phone: [phone] Relation: Son    Allergies: Amoxicillin; Baclofen; Hctz [hydrochlorothiazide]; and Valium [diazepam]  Chief Complaint  Patient presents with  . Medical Management of Chronic Issues    Routine Visit    HPI: Patient is 83 y.o. female who is being seen for routine issues of chronic hip pain, chronic L shoulder pain and anemia.  Past Medical History:  Diagnosis Date  . Acute encephalopathy   . AKI (acute kidney injury) ([place])   . Altered mental state   . Anemia   . Aortic stenosis   . Arthritis    Back, knees  . Bowel incontinence   . Colon polyps   . Constipation   . Gastric ulcer    years ago  . Hypertension   . Hypokalemia   . Restless legs syndrome    on Requip  . Situational stress   . Spinal stenosis   . Stroke ([place])    Mini, no residual  . TIA (transient ischemic attack)   . Ulcer causing bleeding and hole in wall of stomach or small intestine   . Urinary bladder calculus   . Urinary incontinence   . Vertigo     Past Surgical History:  Procedure Laterality Date  . COLON RESECTION  50 years ago  . LUMBAR LAMINECTOMY/DECOMPRESSION MICRODISCECTOMY  06/25/2011   Procedure: LUMBAR LAMINECTOMY/DECOMPRESSION MICRODISCECTOMY;  Surgeon: Hosie Spangle, MD;  Location: [facility];  Service: Neurosurgery;   Laterality: Right;  RIGHT Lumbar Two-Three hemilaminectomy and microdiskectomy  . TONSILLECTOMY    . UTERINE FIBROID SURGERY    . VAGINAL HYSTERECTOMY      Allergies as of 08/08/2016      Reactions   Amoxicillin Other (See Comments)   Reaction unknown   Baclofen Other (See Comments)   "fuzzy in the eyes" and dizzy   Hctz [hydrochlorothiazide]    nausea   Valium [diazepam] Other (See Comments)   Pt became unresponsive and O2 sats dropped.      Medication List       Accurate as of 08/08/16 11:59 PM. Always use your most recent med list.          acetaminophen 325 MG tablet Commonly known as:  TYLENOL Take 650 mg by mouth every 6 (six) hours as needed. Notify MD if not relieved. Not to exceed 3000 mg in 24 hour period   amLODipine 5 MG tablet Commonly known as:  NORVASC Take 5 mg by mouth daily.   CHLORASEPTIC MAX SORE THROAT 1.5-33 % Liqd Generic drug:  Phenol-Glycerin Use as directed 1 spray in the mouth or throat 3 (three) times daily as needed. For throat irritation/pain. If sore throat persist notify the MD   feeding supplement Liqd Take 1 Container by mouth 2 (two) times daily between meals.   lubiprostone 24 MCG capsule Commonly known as:  AMITIZA Take 24 mcg  by mouth 2 (two) times daily with a meal.   magnesium hydroxide 400 MG/5ML suspension Commonly known as:  MILK OF MAGNESIA Take 30 mLs by mouth daily as needed for mild constipation. Reported on 07/27/2015   mirtazapine 15 MG tablet Commonly known as:  REMERON Take 15 mg by mouth at bedtime.   morphine 15 MG 12 hr tablet Commonly known as:  MS CONTIN Take 15 mg by mouth 2 (two) times daily.   multivitamin tablet Take 1 tablet by mouth daily.   MYRBETRIQ 50 MG Tb24 tablet Generic drug:  mirabegron ER Take 50 mg by mouth daily.   ondansetron 4 MG tablet Commonly known as:  ZOFRAN Take 4 mg by mouth every 12 (twelve) hours as needed for nausea or vomiting.   pantoprazole 40 MG tablet Commonly  known as:  PROTONIX Take 40 mg by mouth daily.   potassium chloride 10 MEQ tablet Commonly known as:  K-DUR Take 20 mEq by mouth daily. 2 tablets   rOPINIRole 4 MG tablet Commonly known as:  REQUIP Take 4 mg by mouth at bedtime.   SYSTANE BALANCE 0.6 % Soln Generic drug:  Propylene Glycol Place 1 drop into both eyes 2 (two) times daily.   torsemide 20 MG tablet Commonly known as:  DEMADEX Take 40 mg by mouth every morning. 2 tabs       No orders of the defined types were placed in this encounter.   Immunization History  Administered Date(s) Administered  . Influenza Split 04/15/2012  . Influenza,inj,Quad PF,36+ Mos 03/06/2014  . Influenza-Unspecified 03/01/2016  . PPD Test 08/30/2014, 09/18/2014  . Pneumococcal Polysaccharide-23 08/29/2013    Social History  Substance Use Topics  . Smoking status: Never Smoker  . Smokeless tobacco: Never Used  . Alcohol use 0.0 oz/week     Comment: rarely    Review of Systems  DATA OBTAINED: from patient, nurse GENERAL:  no fevers, fatigue, appetite changes SKIN: No itching, rash HEENT: No complaint RESPIRATORY: No cough, wheezing, SOB CARDIAC: No chest pain, palpitations, lower extremity edema  GI: No abdominal pain, No N/V/D or constipation, No heartburn or reflux  GU: No dysuria, frequency or urgency, or incontinence  MUSCULOSKELETAL: chronic hip and shoulder pain NEUROLOGIC: No headache, dizziness  PSYCHIATRIC: No overt anxiety or sadness  Vitals:   08/08/16 1010  BP: (!) 108/58  Pulse: 66  Resp: 18  Temp: 97.9 F (36.6 C)   Body mass index is 18.67 kg/m. Physical Exam  GENERAL APPEARANCE: Alert, conversant, No acute distress  SKIN: No diaphoresis rash HEENT: Unremarkable RESPIRATORY: Breathing is even, unlabored. Lung sounds are clear   CARDIOVASCULAR: Heart RRR no murmurs, rubs or gallops. No peripheral edema  GASTROINTESTINAL: Abdomen is soft, non-tender, not distended w/ normal bowel sounds.    GENITOURINARY: Bladder non tender, not distended  MUSCULOSKELETAL: No abnormal joints or musculature NEUROLOGIC: Cranial nerves 2-12 grossly intact. Moves all extremities PSYCHIATRIC: Mood and affect appropriate to situation with dementia, no behavioral issues  Patient Active Problem List   Diagnosis Date Noted  . Pressure injury of skin 08/15/2016  . Aspiration pneumonia ([place]) 08/14/2016  . Chronic diastolic CHF (congestive heart failure) ([place]) 08/14/2016  . Chronic right shoulder pain 02/09/2016  . Bilateral lower extremity edema 02/02/2016  . Ileitis 01/19/2016  . Pressure ulcer 01/09/2016  . SBO (small bowel obstruction) 01/08/2016  . Narcotic addiction ([place]) 05/15/2015  . Diarrhea 03/30/2015  . Acute pharyngitis 02/26/2015  . Osteoarthritis of right knee 02/22/2015  . Pain in  joint, lower leg 10/31/2014  . Ulcer of sacral region, stage 4 ([place]) 04/15/2014  . Moderate aortic stenosis 04/14/2014  . Pre-syncope 04/14/2014  . Hypotension 04/14/2014  . Acute renal failure superimposed on stage 2 chronic kidney disease ([place]) 04/14/2014  . Anemia 04/14/2014  . Protein-calorie malnutrition, severe ([place]) 04/14/2014  . Altered mental status   . Dehydration 04/13/2014  . Altered mental state 04/13/2014  . Acute kidney injury ([place])   . Hypokalemia 03/25/2014  . Syncope 03/16/2014  . Acute encephalopathy 03/16/2014  . Leg swelling 03/02/2014  . Avascular necrosis of bone of right hip ([place]) 09/27/2013  . Primary osteoarthritis of right hip 09/07/2013  . Spinal stenosis, lumbar region, with neurogenic claudication 09/07/2013  . Closed fracture of pubic ramus ([place]) 09/07/2013  . BPPV (benign paroxysmal positional vertigo) 09/07/2013  . Pharyngeal swelling 08/30/2013  . Pelvic fracture ([place]) 08/28/2013  . Fall 08/26/2013  . Dizziness 08/26/2013  . Abnormal stress test 08/26/2013  . Preop cardiovascular exam 08/01/2013  . Aortic stenosis 08/01/2013  . Edema 06/30/2013  . Loss of  weight 01/04/2013  . Situational stress 01/04/2013  . Chronic right hip pain 07/05/2012  . Knee pain, acute 06/29/2012  . Vertigo 06/24/2012  . Acute exacerbation of chronic low back pain 06/24/2012  . Lumbar pain with radiation down right leg 06/09/2012  . Osteoarthritis of hip 04/29/2012  . History of TIAs 04/17/2012  . S/P total hysterectomy and bilateral salpingo-oophorectomy 04/17/2012  . Hard of hearing 04/17/2012  . History of anemia 04/17/2012  . RLS (restless legs syndrome) 04/17/2012  . Chronic back pain 04/17/2012  . Spinal stenosis, lumbar 04/17/2012  . Lower back pain 12/20/2011  . Personal history of colonic polyps 10/08/2011  . H/O 09/14/2011  . Essential hypertension 09/14/2011  . Restless legs syndrome 09/14/2011  . Constipation 08/20/2011  . Urinary incontinence 08/20/2011    CMP     Component Value Date/Time   NA 142 08/17/2016 0343   NA 142 08/17/2016   K 3.9 08/17/2016 0343   CL 106 08/17/2016 0343   CO2 28 08/17/2016 0343   GLUCOSE 92 08/17/2016 0343   BUN 22 (H) 08/17/2016 0343   BUN 22 (A) 08/17/2016   CREATININE 1.13 (H) 08/17/2016 0343   CREATININE 0.78 06/28/2014 1205   CALCIUM 7.8 (L) 08/17/2016 0343   PROT 6.6 08/14/2016 1519   ALBUMIN 3.4 (L) 08/14/2016 1519   AST 28 08/14/2016 1519   ALT 17 08/14/2016 1519   ALKPHOS 107 08/14/2016 1519   BILITOT 1.1 08/14/2016 1519   GFRNONAA 40 (L) 08/17/2016 0343   GFRAA 46 (L) 08/17/2016 0343    Recent Labs  01/10/16 0838  08/15/16 0531 08/16/16 08/16/16 0457 08/17/16 08/17/16 0343  NA 143  < > 140 147 147* 142 142  K 3.3*  < > 3.3* 3.1* 3.1*  --  3.9  CL 107  < > 94*  --  104  --  106  CO2 27  < > 34*  --  31  --  28  GLUCOSE 133*  < > 86  --  69  --  92  BUN 29*  < > 28* 23* 23* 22* 22*  CREATININE 0.71  < > 1.79* 1.2* 1.20* 1.1 1.13*  CALCIUM 8.8*  < > 8.0*  --  8.0*  --  7.8*  MG 2.0  --   --   --  2.2  --   --   < > = values in this interval not  displayed.  Recent Labs   01/08/16 0412 03/19/16 06/04/16 08/14/16 1519  AST 21 21 16 28   ALT 15 12 6* 17  ALKPHOS 109 104 109 107  BILITOT 0.5  --   --  1.1  PROT 7.6  --   --  6.6  ALBUMIN 4.4  --   --  3.4*    Recent Labs  01/08/16 0412  08/14/16 1519 08/15/16 08/15/16 0531 08/16/16 08/16/16 0457  WBC 26.4*  < > 21.1* 16.1 16.1* 11.4 11.4*  NEUTROABS 24.0*  --  19.6*  --   --   --   --   HGB 12.2  < > 9.4* 8.5* 8.5*  --  8.3*  HCT 39.2  < > 31.3* 28* 28.2*  --  27.8*  MCV 86.5  < > 81.7  --  82.5  --  83.5  PLT 336  < > 300 256 256  --  261  < > = values in this interval not displayed.  Recent Labs  03/19/16  CHOL 141  LDLCALC 71  TRIG 77   No results found for: [facility] Lab Results  Component Value Date   TSH 2.46 04/26/2016   Lab Results  Component Value Date   HGBA1C 5.6 03/19/2016   Lab Results  Component Value Date   CHOL 141 03/19/2016   HDL 55 03/19/2016   LDLCALC 71 03/19/2016   TRIG 77 03/19/2016   CHOLHDL 2.8 03/06/2014    Significant Diagnostic Results in last 30 days:  Ct Abdomen Pelvis Wo Contrast  Result Date: 08/14/2016 CLINICAL DATA:  Nausea and vomiting. Abdominal pain after starting Phenergan. EXAM: CT ABDOMEN AND PELVIS WITHOUT CONTRAST TECHNIQUE: Multidetector CT imaging of the abdomen and pelvis was performed following the standard protocol without IV contrast. COMPARISON:  01/08/2016. FINDINGS: Lower chest: Patchy airspace opacity in the right middle lobe and right lower lobe. Mild left lower lobe atelectasis. No pleural fluid. Hepatobiliary: Mildly distended gallbladder containing sludge and a 9 mm gallstone. No gallbladder wall thickening or pericholecystic fluid seen. Unremarkable liver. Pancreas: Unremarkable. No pancreatic ductal dilatation or surrounding inflammatory changes. Spleen: Normal in size without focal abnormality. Adrenals/Urinary Tract: 2 tiny lower pole left renal calculi. Interval minimally dilated ureters and collecting systems. Distended  urinary bladder. No urinary tract calculi seen. Stomach/Bowel: Progressive dilatation of the stomach and similar degree of dilatation of the proximal small bowel. Normal caliber distal small bowel with some fecalized bowel contents. No pneumatosis or wall thickening. No well-defined transition point. Inferior displacement of the rectum. Prominent stool throughout normal caliber colon. No evidence of appendicitis. Vascular/Lymphatic: Atheromatous arterial calcifications, including the abdominal aorta and dense coronary artery calcifications. No enlarged lymph nodes. Reproductive: Surgically absent uterus.  No adnexal masses. Other: Small amount of free peritoneal fluid. Mild diffuse subcutaneous edema. Musculoskeletal: Marked right hip degenerative changes with joint space narrowing and subarticular cyst formation. Lumbar and lower thoracic spine degenerative changes. IMPRESSION: 1. Recurrent partial small bowel obstruction. The cause of obstruction is not identified. Reactive ileus due to right lung pneumonia is also a possibility. 2. Patchy airspace opacity in the right middle lobe and right lower lobe suspicious for pneumonia. 3. Rectocele. 4. 2 tiny, nonobstructing lower pole left renal calculi. 5. Interval minimal bilateral hydronephrosis, possibly mechanical due to mild distention of the urinary bladder. 6. Cholelithiasis and sludge in the gallbladder without evidence of cholecystitis. 7. Densely calcified coronary artery and aortic atherosclerosis. Electronically Signed   By: Claudie Revering M.D.   On: 08/14/2016  18:22   Dg Abd 2 Views  Result Date: 08/15/2016 CLINICAL DATA:  Check nasogastric catheter placement EXAM: ABDOMEN - 2 VIEW COMPARISON:  08/14/2016 FINDINGS: Nasogastric catheter is noted coiled within the fundus of the stomach stable from the prior exam. Scattered large and small bowel gas is again seen. The degree of small bowel dilatation has improved somewhat in the interval from the prior exam.  Fecal material is noted throughout the colon. Degenerative changes of the lumbar spine and hip joints are noted. IMPRESSION: Nasogastric catheter within the stomach. The degree of small bowel dilatation has improved in the interval. Electronically Signed   By: Inez Catalina M.D.   On: 08/15/2016 09:10   Dg Abd Portable 1v-small Bowel Protocol-position Verification  Result Date: 08/16/2016 CLINICAL DATA:  Assess for small bowel obstruction. Initial encounter. EXAM: PORTABLE ABDOMEN - 1 VIEW COMPARISON:  Abdominal radiograph from 08/15/2016 FINDINGS: Contrast is seen filling the colon. There is no evidence for bowel obstruction. The visualized bowel gas pattern is grossly unremarkable. No free intra-abdominal air is identified, though evaluation for free air is limited on a single supine view. No acute osseous abnormalities are seen. Degenerative change is noted at the right hip joint. IMPRESSION: Contrast seen filling the colon. No evidence for bowel obstruction. No free intra-abdominal air seen. Electronically Signed   By: Garald Balding M.D.   On: 08/16/2016 02:20   Dg Abd Portable 1v  Result Date: 08/14/2016 CLINICAL DATA:  NG tube placement EXAM: PORTABLE ABDOMEN - 1 VIEW COMPARISON:  08/14/2016 FINDINGS: Esophageal tube tip is folded upon itself in the gastric fundus. Multiple dilated loops of small bowel up to 5.7 cm, consistent with small bowel obstruction. IMPRESSION: Esophageal tube tip overlies the proximal stomach. Electronically Signed   By: Donavan Foil M.D.   On: 08/14/2016 20:49   Dg Hip Unilat With Pelvis 1v Right  Result Date: 08/16/2016 CLINICAL DATA:  Pain and limited range of motion EXAM: DG HIP (WITH OR WITHOUT PELVIS) RIGHT:  2 V COMPARISON:  None. FINDINGS: Frontal pelvis as well as lateral right hip images were obtained. Bones are osteoporotic. No acute fracture or dislocation is evident. There is moderate narrowing of both hip joints. There is subtle flattening of the femoral head  on the right, likely due to a degree of underlying avascular necrosis in the right femoral head. There is contrast in portions of small bowel and colon. Postoperative changes noted in the pelvis. IMPRESSION: Osteoarthritic change in both hip joints, more severe on the right. Suspect a degree of avascular necrosis in the right femoral head. Bones are diffusely osteoporotic. No acute fracture or dislocation. Electronically Signed   By: Lowella Grip III M.D.   On: 08/16/2016 10:35    Assessment and Plan  Chronic right shoulder pain Does seem to respond to PT; pt using chronic pain meds for hip which covers shoulder as well;; cont MS Contin 15 mg q12 with norco 5X daily prn  Chronic right hip pain Chronic pain from avascular necrosis; pt is on pain regimen from pain clinic; MS contin 15 mg BID and prn norco 5x daily;plan to cont current regimen  Anemia Hb 8.3 which is a drop from prior;MCV is low will start iron FeSO4 325 mg     Shelly Martin. [name], MD

## 2016-08-09 ENCOUNTER — Encounter: Payer: Self-pay | Admitting: Internal Medicine

## 2016-08-12 ENCOUNTER — Non-Acute Institutional Stay (SKILLED_NURSING_FACILITY): Payer: Medicare Other | Admitting: Internal Medicine

## 2016-08-12 ENCOUNTER — Encounter: Payer: Self-pay | Admitting: Internal Medicine

## 2016-08-12 DIAGNOSIS — R1031 Right lower quadrant pain: Secondary | ICD-10-CM

## 2016-08-12 DIAGNOSIS — K5901 Slow transit constipation: Secondary | ICD-10-CM | POA: Diagnosis not present

## 2016-08-12 NOTE — Progress Notes (Signed)
Location:   [place] of Service:   [place] [facility] [place] D. [name], MD  Patient Care Team: Lanice Shirts, MD as PCP - General (Internal Medicine) [name], MD as Consulting Physician (Orthopedic Surgery) [name], MD as Consulting Physician (Otolaryngology)  Extended Emergency Contact Information Primary Emergency Contact: [name] Address: [place], [place] [address] [name] of [place] Phone: [phone] Relation: Daughter Secondary Emergency Contact: [place], [place] of [place] Mobile Phone: [phone] Relation: Son    Allergies: Amoxicillin; Baclofen; Hctz [hydrochlorothiazide]; and Valium [diazepam]  Chief Complaint  Patient presents with  . Acute Visit    HPI: Patient is 83 y.o. female who nursing asked me to see because she c/o vomiting "for weeks" and abd pain today. Per nursing pt with BS and had gotten MOM  For constipation last night with small results. Nurse reports some L side pain on palpation.  Past Medical History:  Diagnosis Date  . Acute encephalopathy   . AKI (acute kidney injury) ([place])   . Altered mental state   . Anemia   . Aortic stenosis   . Arthritis    Back, knees  . Bowel incontinence   . Colon polyps   . Constipation   . Gastric ulcer    years ago  . Hypertension   . Hypokalemia   . Restless legs syndrome    on Requip  . Situational stress   . Spinal stenosis   . Stroke ([place])    Mini, no residual  . TIA (transient ischemic attack)   . Ulcer causing bleeding and hole in wall of stomach or small intestine   . Urinary bladder calculus   . Urinary incontinence   . Vertigo     Past Surgical History:  Procedure Laterality Date  . COLON RESECTION  50 years ago  . LUMBAR LAMINECTOMY/DECOMPRESSION MICRODISCECTOMY  06/25/2011   Procedure: LUMBAR LAMINECTOMY/DECOMPRESSION MICRODISCECTOMY;   Surgeon: Hosie Spangle, MD;  Location: [facility];  Service: Neurosurgery;  Laterality: Right;  RIGHT Lumbar Two-Three hemilaminectomy and microdiskectomy  . TONSILLECTOMY    . UTERINE FIBROID SURGERY    . VAGINAL HYSTERECTOMY      Allergies as of 08/12/2016      Reactions   Amoxicillin Other (See Comments)   Reaction unknown   Baclofen Other (See Comments)   "fuzzy in the eyes" and dizzy   Hctz [hydrochlorothiazide]    nausea   Valium [diazepam] Other (See Comments)   Pt became unresponsive and O2 sats dropped.      Medication List       Accurate as of 08/12/16 11:59 PM. Always use your most recent med list.          acetaminophen 325 MG tablet Commonly known as:  TYLENOL Take 650 mg by mouth every 6 (six) hours as needed. Notify MD if not relieved. Not to exceed 3000 mg in 24 hour period   amLODipine 5 MG tablet Commonly known as:  NORVASC Take 5 mg by mouth daily.   CHLORASEPTIC MAX SORE THROAT 1.5-33 % Liqd Generic drug:  Phenol-Glycerin Use as directed 1 spray in the mouth or throat 3 (three) times daily as needed. For throat irritation/pain. If sore throat persist notify the MD   feeding supplement Liqd Take 1 Container by mouth 2 (two)  times daily between meals.   lubiprostone 24 MCG capsule Commonly known as:  AMITIZA Take 24 mcg by mouth 2 (two) times daily with a meal.   magnesium hydroxide 400 MG/5ML suspension Commonly known as:  MILK OF MAGNESIA Take 30 mLs by mouth daily as needed for mild constipation. Reported on 07/27/2015   mirtazapine 15 MG tablet Commonly known as:  REMERON Take 15 mg by mouth at bedtime.   morphine 15 MG 12 hr tablet Commonly known as:  MS CONTIN Take 15 mg by mouth 2 (two) times daily.   multivitamin tablet Take 1 tablet by mouth daily.   MYRBETRIQ 50 MG Tb24 tablet Generic drug:  mirabegron ER Take 50 mg by mouth daily.   ondansetron 4 MG tablet Commonly known as:  ZOFRAN Take 4 mg by mouth every 12 (twelve)  hours as needed for nausea or vomiting.   pantoprazole 40 MG tablet Commonly known as:  PROTONIX Take 40 mg by mouth daily.   potassium chloride 10 MEQ tablet Commonly known as:  K-DUR Take 20 mEq by mouth daily. 2 tablets   rOPINIRole 4 MG tablet Commonly known as:  REQUIP Take 4 mg by mouth at bedtime.   SYSTANE BALANCE 0.6 % Soln Generic drug:  Propylene Glycol Place 1 drop into both eyes 2 (two) times daily.   torsemide 20 MG tablet Commonly known as:  DEMADEX Take 40 mg by mouth every morning. 2 tabs       No orders of the defined types were placed in this encounter.   Immunization History  Administered Date(s) Administered  . Influenza Split 04/15/2012  . Influenza,inj,Quad PF,36+ Mos 03/06/2014  . Influenza-Unspecified 03/01/2016  . PPD Test 08/30/2014, 09/18/2014  . Pneumococcal Polysaccharide-23 08/29/2013    Social History  Substance Use Topics  . Smoking status: Never Smoker  . Smokeless tobacco: Never Used  . Alcohol use 0.0 oz/week     Comment: rarely    Review of Systems  DATA OBTAINED: from patient, nurse GENERAL:  no fevers, fatigue, appetite changes SKIN: No itching, rash HEENT: No complaint RESPIRATORY: No cough, wheezing, SOB CARDIAC: No chest pain, palpitations, lower extremity edema  GI: No abdominal pain, No N/V/D, +constipation, No heartburn or reflux  GU: No dysuria, frequency or urgency, or incontinence  MUSCULOSKELETAL: No unrelieved bone/joint pain NEUROLOGIC: No headache, dizziness  PSYCHIATRIC: No overt anxiety or sadness  Vitals:   08/12/16 0910  BP: (!) 108/58  Pulse: 66  Resp: 18  Temp: 97.9 F (36.6 C)   Body mass index is 18.67 kg/m. Physical Exam  GENERAL APPEARANCE: Alert, conversant, No acute distress  SKIN: No diaphoresis rash HEENT: Unremarkable RESPIRATORY: Breathing is even, unlabored. Lung sounds are clear   CARDIOVASCULAR: Heart RRR no murmurs, rubs or gallops. trace peripheral edema    GASTROINTESTINAL: Abdomen is soft, mild distended,  w/ active bowel sounds, mild R side pain, no guarding, no rebound GENITOURINARY: Bladder non tender, not distended  MUSCULOSKELETAL: No abnormal joints or musculature NEUROLOGIC: Cranial nerves 2-12 grossly intact. Moves all extremities PSYCHIATRIC: Mood and affect appropriate to situation with dementia, no behavioral issues  Patient Active Problem List   Diagnosis Date Noted  . Chronic right shoulder pain 02/09/2016  . Bilateral lower extremity edema 02/02/2016  . Ileitis 01/19/2016  . Pressure ulcer 01/09/2016  . SBO (small bowel obstruction) 01/08/2016  . Narcotic addiction ([facility] [place]) 05/15/2015  . Diarrhea 03/30/2015  . Acute pharyngitis 02/26/2015  . Osteoarthritis of right knee 02/22/2015  . Pain in  joint, lower leg 10/31/2014  . Ulcer of sacral region, stage 4 ([place]) 04/15/2014  . Moderate aortic stenosis 04/14/2014  . Pre-syncope 04/14/2014  . Hypotension 04/14/2014  . Acute on chronic renal failure ([place]) 04/14/2014  . Anemia 04/14/2014  . Protein-calorie malnutrition, severe ([place]) 04/14/2014  . Altered mental status   . Dehydration 04/13/2014  . Altered mental state 04/13/2014  . Acute kidney injury ([facility])   . Hypokalemia 03/25/2014  . Syncope 03/16/2014  . Acute encephalopathy 03/16/2014  . Leg swelling 03/02/2014  . Avascular necrosis of bone of right hip ([place]) 09/27/2013  . Primary osteoarthritis of right hip 09/07/2013  . Spinal stenosis, lumbar region, with neurogenic claudication 09/07/2013  . Closed fracture of pubic ramus ([place]) 09/07/2013  . BPPV (benign paroxysmal positional vertigo) 09/07/2013  . Pharyngeal swelling 08/30/2013  . Pelvic fracture ([place]) 08/28/2013  . Fall 08/26/2013  . Dizziness 08/26/2013  . Abnormal stress test 08/26/2013  . Preop cardiovascular exam 08/01/2013  . Aortic stenosis 08/01/2013  . Edema 06/30/2013  . Loss of weight 01/04/2013  . Situational stress 01/04/2013  . Chronic right  hip pain 07/05/2012  . Knee pain, acute 06/29/2012  . Vertigo 06/24/2012  . Acute exacerbation of chronic low back pain 06/24/2012  . Lumbar pain with radiation down right leg 06/09/2012  . Osteoarthritis of hip 04/29/2012  . History of TIAs 04/17/2012  . S/P total hysterectomy and bilateral salpingo-oophorectomy 04/17/2012  . Hard of hearing 04/17/2012  . History of anemia 04/17/2012  . RLS (restless legs syndrome) 04/17/2012  . Chronic back pain 04/17/2012  . Spinal stenosis, lumbar 04/17/2012  . Lower back pain 12/20/2011  . Personal history of colonic polyps 10/08/2011  . H/O: CVA (cardiovascular accident) 09/14/2011  . Essential hypertension 09/14/2011  . Restless legs syndrome 09/14/2011  . Constipation 08/20/2011  . Urinary incontinence 08/20/2011    CMP     Component Value Date/Time   NA 138 06/13/2016   K 4.1 06/13/2016   CL 101 01/12/2016 0536   CO2 27 01/12/2016 0536   GLUCOSE 120 (H) 01/12/2016 0536   BUN 36 (A) 06/13/2016   CREATININE 1.4 (A) 06/13/2016   CREATININE 0.51 01/12/2016 0536   CREATININE 0.78 06/28/2014 1205   CALCIUM 8.3 (L) 01/12/2016 0536   PROT 7.6 01/08/2016 0412   ALBUMIN 4.4 01/08/2016 0412   AST 16 06/04/2016   ALT 6 (A) 06/04/2016   ALKPHOS 109 06/04/2016   BILITOT 0.5 01/08/2016 0412   GFRNONAA >60 01/12/2016 0536   GFRAA >60 01/12/2016 0536    Recent Labs  01/10/16 7829  01/11/16 0751  01/12/16 0536  04/26/16 06/04/16 06/13/16  NA 143  < > 137  < > 135  < > 141 140 138  K 3.3*  < > 3.0*  --  3.7  < > 3.8 4.4 4.1  CL 107  --  102  --  101  --   --   --   --   CO2 27  --  26  --  27  --   --   --   --   GLUCOSE 133*  --  133*  --  120*  --   --   --   --   BUN 29*  < > 28*  < > 23*  < > 23* 34* 36*  CREATININE 0.71  < > 0.74  < > 0.51  < > 1.1 1.3* 1.4*  CALCIUM 8.8*  --  8.4*  --  8.3*  --   --   --   --  MG 2.0  --   --   --   --   --   --   --   --   < > = values in this interval not displayed.  Recent Labs   01/08/16 0412 03/19/16 06/04/16  AST 21 21 16   ALT 15 12 6*  ALKPHOS 109 104 109  BILITOT 0.5  --   --   PROT 7.6  --   --   ALBUMIN 4.4  --   --     Recent Labs  01/08/16 0412  01/10/16 0838  01/11/16 0751  01/12/16 0536 03/19/16 04/26/16  WBC 26.4*  < > 15.1*  < > 12.6*  < > 9.4 8.0 6.5  NEUTROABS 24.0*  --   --   --   --   --   --   --   --   HGB 12.2  < > 11.9*  < > 10.9*  --  10.5* 9.6* 8.5*  HCT 39.2  < > 36.9  < > 33.5*  --  32.6* 30* 27*  MCV 86.5  < > 85.2  --  85.0  --  83.8  --   --   PLT 336  < > 311  < > 307  --  250 278 266  < > = values in this interval not displayed.  Recent Labs  03/19/16  CHOL 141  LDLCALC 71  TRIG 77   No results found for: [facility] Lab Results  Component Value Date   TSH 2.46 04/26/2016   Lab Results  Component Value Date   HGBA1C 5.6 03/19/2016   Lab Results  Component Value Date   CHOL 141 03/19/2016   HDL 55 03/19/2016   LDLCALC 71 03/19/2016   TRIG 77 03/19/2016   CHOLHDL 2.8 03/06/2014    Significant Diagnostic Results in last 30 days:  No results found.  Assessment and Plan  ABDOMINAL PAIN. CONSTIPATION -  I see Mrs Wisham almost everyday and she has not been vomiting for weeks; she does worry; pt appears to have gas and constipation; have ordered KUB and upright   Later entry - film shows moderate constipation; I have asked nursing to give pt an enema    Time spent > 25 min;> 50% of time with patient was spent reviewing records, labs, tests and studies, counseling and developing plan of care  [facility] D. [name], MD

## 2016-08-13 ENCOUNTER — Encounter: Payer: Self-pay | Admitting: Internal Medicine

## 2016-08-14 ENCOUNTER — Encounter (HOSPITAL_COMMUNITY): Payer: Self-pay | Admitting: Emergency Medicine

## 2016-08-14 ENCOUNTER — Inpatient Hospital Stay (HOSPITAL_COMMUNITY)
Admission: EM | Admit: 2016-08-14 | Discharge: 2016-08-17 | DRG: 388 | Disposition: A | Discharge status: Home / Self Care | Payer: Medicare Other | Attending: Internal Medicine | Admitting: Internal Medicine

## 2016-08-14 ENCOUNTER — Emergency Department (HOSPITAL_COMMUNITY): Payer: Medicare Other

## 2016-08-14 ENCOUNTER — Other Ambulatory Visit: Payer: Self-pay | Admitting: Internal Medicine

## 2016-08-14 DIAGNOSIS — D509 Iron deficiency anemia, unspecified: Secondary | ICD-10-CM | POA: Diagnosis present

## 2016-08-14 DIAGNOSIS — I5032 Chronic diastolic (congestive) heart failure: Secondary | ICD-10-CM | POA: Diagnosis present

## 2016-08-14 DIAGNOSIS — R32 Unspecified urinary incontinence: Secondary | ICD-10-CM | POA: Diagnosis present

## 2016-08-14 DIAGNOSIS — F0390 Unspecified dementia without behavioral disturbance: Secondary | ICD-10-CM | POA: Diagnosis present

## 2016-08-14 DIAGNOSIS — F1120 Opioid dependence, uncomplicated: Secondary | ICD-10-CM | POA: Diagnosis not present

## 2016-08-14 DIAGNOSIS — I350 Nonrheumatic aortic (valve) stenosis: Secondary | ICD-10-CM | POA: Diagnosis present

## 2016-08-14 DIAGNOSIS — E46 Unspecified protein-calorie malnutrition: Secondary | ICD-10-CM | POA: Diagnosis present

## 2016-08-14 DIAGNOSIS — Z0189 Encounter for other specified special examinations: Secondary | ICD-10-CM

## 2016-08-14 DIAGNOSIS — E860 Dehydration: Secondary | ICD-10-CM | POA: Diagnosis present

## 2016-08-14 DIAGNOSIS — I130 Hypertensive heart and chronic kidney disease with heart failure and stage 1 through stage 4 chronic kidney disease, or unspecified chronic kidney disease: Secondary | ICD-10-CM | POA: Diagnosis present

## 2016-08-14 DIAGNOSIS — K8020 Calculus of gallbladder without cholecystitis without obstruction: Secondary | ICD-10-CM | POA: Diagnosis present

## 2016-08-14 DIAGNOSIS — Z800 Family history of malignant neoplasm of digestive organs: Secondary | ICD-10-CM | POA: Diagnosis not present

## 2016-08-14 DIAGNOSIS — F329 Major depressive disorder, single episode, unspecified: Secondary | ICD-10-CM | POA: Diagnosis present

## 2016-08-14 DIAGNOSIS — Z8711 Personal history of peptic ulcer disease: Secondary | ICD-10-CM | POA: Diagnosis not present

## 2016-08-14 DIAGNOSIS — Z8673 Personal history of transient ischemic attack (TIA), and cerebral infarction without residual deficits: Secondary | ICD-10-CM

## 2016-08-14 DIAGNOSIS — N182 Chronic kidney disease, stage 2 (mild): Secondary | ICD-10-CM | POA: Diagnosis present

## 2016-08-14 DIAGNOSIS — K5651 Intestinal adhesions [bands], with partial obstruction: Principal | ICD-10-CM | POA: Diagnosis present

## 2016-08-14 DIAGNOSIS — N816 Rectocele: Secondary | ICD-10-CM | POA: Diagnosis present

## 2016-08-14 DIAGNOSIS — G2581 Restless legs syndrome: Secondary | ICD-10-CM | POA: Diagnosis present

## 2016-08-14 DIAGNOSIS — L89310 Pressure ulcer of right buttock, unstageable: Secondary | ICD-10-CM | POA: Diagnosis present

## 2016-08-14 DIAGNOSIS — K219 Gastro-esophageal reflux disease without esophagitis: Secondary | ICD-10-CM | POA: Diagnosis present

## 2016-08-14 DIAGNOSIS — L8990 Pressure ulcer of unspecified site, unspecified stage: Secondary | ICD-10-CM | POA: Diagnosis present

## 2016-08-14 DIAGNOSIS — N179 Acute kidney failure, unspecified: Secondary | ICD-10-CM | POA: Diagnosis present

## 2016-08-14 DIAGNOSIS — Z86010 Personal history of colonic polyps: Secondary | ICD-10-CM

## 2016-08-14 DIAGNOSIS — Z823 Family history of stroke: Secondary | ICD-10-CM

## 2016-08-14 DIAGNOSIS — Z888 Allergy status to other drugs, medicaments and biological substances status: Secondary | ICD-10-CM

## 2016-08-14 DIAGNOSIS — J690 Pneumonitis due to inhalation of food and vomit: Secondary | ICD-10-CM | POA: Diagnosis present

## 2016-08-14 DIAGNOSIS — I700 Atherosclerosis of aorta: Secondary | ICD-10-CM | POA: Diagnosis present

## 2016-08-14 DIAGNOSIS — Z881 Allergy status to other antibiotic agents status: Secondary | ICD-10-CM | POA: Diagnosis not present

## 2016-08-14 DIAGNOSIS — N170 Acute kidney failure with tubular necrosis: Secondary | ICD-10-CM | POA: Diagnosis not present

## 2016-08-14 DIAGNOSIS — D72828 Other elevated white blood cell count: Secondary | ICD-10-CM

## 2016-08-14 DIAGNOSIS — K56609 Unspecified intestinal obstruction, unspecified as to partial versus complete obstruction: Secondary | ICD-10-CM | POA: Diagnosis present

## 2016-08-14 DIAGNOSIS — I10 Essential (primary) hypertension: Secondary | ICD-10-CM | POA: Diagnosis not present

## 2016-08-14 DIAGNOSIS — E876 Hypokalemia: Secondary | ICD-10-CM | POA: Diagnosis present

## 2016-08-14 DIAGNOSIS — R52 Pain, unspecified: Secondary | ICD-10-CM

## 2016-08-14 DIAGNOSIS — D649 Anemia, unspecified: Secondary | ICD-10-CM | POA: Diagnosis present

## 2016-08-14 LAB — URINALYSIS, ROUTINE W REFLEX MICROSCOPIC
Bacteria, UA: NONE SEEN
Bilirubin Urine: NEGATIVE
Glucose, UA: NEGATIVE mg/dL
HGB URINE DIPSTICK: NEGATIVE
Ketones, ur: NEGATIVE mg/dL
NITRITE: NEGATIVE
PROTEIN: NEGATIVE mg/dL
SPECIFIC GRAVITY, URINE: 1.009 (ref 1.005–1.030)
pH: 7 (ref 5.0–8.0)

## 2016-08-14 LAB — COMPREHENSIVE METABOLIC PANEL
ALBUMIN: 3.4 g/dL — AB (ref 3.5–5.0)
ALT: 17 U/L (ref 14–54)
AST: 28 U/L (ref 15–41)
Alkaline Phosphatase: 107 U/L (ref 38–126)
Anion gap: 11 (ref 5–15)
BUN: 37 mg/dL — AB (ref 6–20)
CHLORIDE: 88 mmol/L — AB (ref 101–111)
CO2: 38 mmol/L — AB (ref 22–32)
Calcium: 9.1 mg/dL (ref 8.9–10.3)
Creatinine, Ser: 2.27 mg/dL — ABNORMAL HIGH (ref 0.44–1.00)
GFR calc Af Amer: 20 mL/min — ABNORMAL LOW (ref >60)
GFR, EST NON AFRICAN AMERICAN: 17 mL/min — AB (ref >60)
Glucose, Bld: 144 mg/dL — ABNORMAL HIGH (ref 65–99)
POTASSIUM: 4.3 mmol/L (ref 3.5–5.1)
SODIUM: 137 mmol/L (ref 135–145)
Total Bilirubin: 1.1 mg/dL (ref 0.3–1.2)
Total Protein: 6.6 g/dL (ref 6.5–8.1)

## 2016-08-14 LAB — CBC WITH DIFFERENTIAL/PLATELET
BASOS ABS: 0 10*3/uL (ref 0.0–0.1)
BASOS PCT: 0 %
EOS ABS: 0 10*3/uL (ref 0.0–0.7)
EOS PCT: 0 %
HCT: 31.3 % — ABNORMAL LOW (ref 36.0–46.0)
Hemoglobin: 9.4 g/dL — ABNORMAL LOW (ref 12.0–15.0)
Lymphocytes Relative: 4 %
Lymphs Abs: 0.9 10*3/uL (ref 0.7–4.0)
MCH: 24.5 pg — ABNORMAL LOW (ref 26.0–34.0)
MCHC: 30 g/dL (ref 30.0–36.0)
MCV: 81.7 fL (ref 78.0–100.0)
MONO ABS: 0.7 10*3/uL (ref 0.1–1.0)
Monocytes Relative: 3 %
Neutro Abs: 19.6 10*3/uL — ABNORMAL HIGH (ref 1.7–7.7)
Neutrophils Relative %: 93 %
PLATELETS: 300 10*3/uL (ref 150–400)
RBC: 3.83 MIL/uL — AB (ref 3.87–5.11)
RDW: 15.7 % — AB (ref 11.5–15.5)
WBC: 21.1 10*3/uL — AB (ref 4.0–10.5)

## 2016-08-14 LAB — BASIC METABOLIC PANEL
BUN: 37 mg/dL — AB (ref 4–21)
CREATININE: 2.3 mg/dL — AB (ref 0.5–1.1)
GLUCOSE: 144 mg/dL
POTASSIUM: 4.3 mmol/L (ref 3.4–5.3)
Sodium: 137 mmol/L (ref 137–147)

## 2016-08-14 LAB — APTT: aPTT: 40 seconds — ABNORMAL HIGH (ref 24–36)

## 2016-08-14 LAB — CBC AND DIFFERENTIAL
HCT: 31 % — AB (ref 36–46)
Hemoglobin: 9.4 g/dL — AB (ref 12.0–16.0)
PLATELETS: 300 10*3/uL (ref 150–399)
WBC: 21.1 10*3/mL

## 2016-08-14 LAB — HEPATIC FUNCTION PANEL: Bilirubin, Total: 1.1 mg/dL

## 2016-08-14 LAB — PROCALCITONIN: PROCALCITONIN: 0.15 ng/mL

## 2016-08-14 LAB — PROTIME-INR
INR: 1.14
Prothrombin Time: 14.6 seconds (ref 11.4–15.2)

## 2016-08-14 LAB — I-STAT TROPONIN, ED: Troponin i, poc: 0 ng/mL (ref 0.00–0.08)

## 2016-08-14 LAB — I-STAT CG4 LACTIC ACID, ED
LACTIC ACID, VENOUS: 1.12 mmol/L (ref 0.5–1.9)
Lactic Acid, Venous: 0.9 mmol/L (ref 0.5–1.9)

## 2016-08-14 MED ORDER — PANTOPRAZOLE SODIUM 40 MG IV SOLR
40.0000 mg | Freq: Two times a day (BID) | INTRAVENOUS | Status: DC
  Administered 2016-08-14 – 2016-08-16 (×3): 40 mg via INTRAVENOUS
  Filled 2016-08-14 (×4): qty 40

## 2016-08-14 MED ORDER — DEXTROSE 5 % IV SOLN
500.0000 mg | INTRAVENOUS | Status: DC
  Administered 2016-08-14 – 2016-08-16 (×3): 500 mg via INTRAVENOUS
  Filled 2016-08-14 (×5): qty 0.5

## 2016-08-14 MED ORDER — ROPINIROLE HCL 1 MG PO TABS
4.0000 mg | ORAL_TABLET | Freq: Every day | ORAL | Status: DC
  Administered 2016-08-14 – 2016-08-16 (×3): 4 mg via ORAL
  Filled 2016-08-14 (×3): qty 4

## 2016-08-14 MED ORDER — MORPHINE SULFATE (PF) 4 MG/ML IV SOLN: 2.0000 mg | INTRAVENOUS | Status: DC | PRN

## 2016-08-14 MED ORDER — ONDANSETRON HCL 4 MG/2ML IJ SOLN
4.0000 mg | Freq: Once | INTRAMUSCULAR | Status: AC
  Administered 2016-08-14: 4 mg via INTRAVENOUS
  Filled 2016-08-14: qty 2

## 2016-08-14 MED ORDER — ALBUTEROL SULFATE (2.5 MG/3ML) 0.083% IN NEBU: 2.5000 mg | INHALATION_SOLUTION | RESPIRATORY_TRACT | Status: DC | PRN

## 2016-08-14 MED ORDER — METRONIDAZOLE IN NACL 5-0.79 MG/ML-% IV SOLN
500.0000 mg | Freq: Once | INTRAVENOUS | Status: DC
  Filled 2016-08-14: qty 100

## 2016-08-14 MED ORDER — ACETAMINOPHEN 650 MG RE SUPP: 650.0000 mg | Freq: Four times a day (QID) | RECTAL | Status: DC | PRN

## 2016-08-14 MED ORDER — FENTANYL CITRATE (PF) 100 MCG/2ML IJ SOLN
25.0000 ug | Freq: Once | INTRAMUSCULAR | Status: AC
  Administered 2016-08-14: 25 ug via INTRAVENOUS
  Filled 2016-08-14: qty 2

## 2016-08-14 MED ORDER — SODIUM CHLORIDE 0.9 % IV SOLN
INTRAVENOUS | Status: DC
  Administered 2016-08-14 – 2016-08-15 (×3): via INTRAVENOUS

## 2016-08-14 MED ORDER — HYDRALAZINE HCL 20 MG/ML IJ SOLN: 5.0000 mg | INTRAMUSCULAR | Status: DC | PRN

## 2016-08-14 MED ORDER — METRONIDAZOLE IN NACL 5-0.79 MG/ML-% IV SOLN
500.0000 mg | Freq: Three times a day (TID) | INTRAVENOUS | Status: DC
  Administered 2016-08-14 – 2016-08-17 (×8): 500 mg via INTRAVENOUS
  Filled 2016-08-14 (×8): qty 100

## 2016-08-14 MED ORDER — ONDANSETRON HCL 4 MG/2ML IJ SOLN: 4.0000 mg | Freq: Three times a day (TID) | INTRAMUSCULAR | Status: DC | PRN

## 2016-08-14 MED ORDER — SODIUM CHLORIDE 0.9 % IV BOLUS (SEPSIS)
1000.0000 mL | Freq: Once | INTRAVENOUS | Status: AC
  Administered 2016-08-14: 1000 mL via INTRAVENOUS

## 2016-08-14 MED ORDER — VANCOMYCIN HCL 500 MG IV SOLR: 500.0000 mg | INTRAVENOUS | Status: DC

## 2016-08-14 MED ORDER — SODIUM CHLORIDE 0.9% FLUSH
3.0000 mL | Freq: Two times a day (BID) | INTRAVENOUS | Status: DC
  Administered 2016-08-15 – 2016-08-16 (×2): 3 mL via INTRAVENOUS

## 2016-08-14 MED ORDER — MORPHINE SULFATE ER 15 MG PO TBCR
15.0000 mg | EXTENDED_RELEASE_TABLET | Freq: Two times a day (BID) | ORAL | Status: DC
  Administered 2016-08-15 – 2016-08-17 (×4): 15 mg via ORAL
  Filled 2016-08-14 (×5): qty 1

## 2016-08-14 MED ORDER — MIRTAZAPINE 15 MG PO TABS
15.0000 mg | ORAL_TABLET | Freq: Every day | ORAL | Status: DC
  Administered 2016-08-15 – 2016-08-16 (×2): 15 mg via ORAL
  Filled 2016-08-14 (×3): qty 1

## 2016-08-14 MED ORDER — ACETAMINOPHEN 325 MG PO TABS: 650.0000 mg | ORAL_TABLET | Freq: Four times a day (QID) | ORAL | Status: DC | PRN

## 2016-08-14 MED ORDER — ZOLPIDEM TARTRATE 5 MG PO TABS: 5.0000 mg | ORAL_TABLET | Freq: Every evening | ORAL | Status: DC | PRN

## 2016-08-14 MED ORDER — VANCOMYCIN HCL IN DEXTROSE 750-5 MG/150ML-% IV SOLN
750.0000 mg | Freq: Once | INTRAVENOUS | Status: AC
  Administered 2016-08-14: 750 mg via INTRAVENOUS
  Filled 2016-08-14: qty 150

## 2016-08-14 NOTE — ED Notes (Signed)
Pt reports mils abd pain with n/v for a while now but became worse a week ago.  Upon assessment, pt is resting with eyes closed, symmetrical rise and fall of chest noted.  Responds to verbal stimuli.

## 2016-08-14 NOTE — ED Provider Notes (Signed)
[facility] DEPT Provider Note   CSN: [id number] Arrival date & time: 08/14/16  1454     History   Chief Complaint Chief Complaint  Patient presents with  . Nausea  . Emesis  . Abdominal Pain    HPI Shelly Martin is a 83 y.o. female.  HPI   45 old female with past medical history as below who presents with abdominal pain and vomiting. Patient has a history of dementia so history is somewhat limited. However, she states that over the last several days, she has had progressively worsening abdominal distention with nausea. She has been vomiting yellow to yellow green vomit for the last several days and has been unable to eat or drink. She was seen by a PA at her facility who prescribed Phenergan which did not improve her symptoms. She was subsequent sent here for evaluation. She also endorses mild constipation and states she has not had a bowel movement or passed gas in the last day. Denies any known history of obstructions. No fevers or chills. No urinary symptoms.  Past Medical History:  Diagnosis Date  . Acute encephalopathy   . AKI (acute kidney injury) ([place])   . Altered mental state   . Anemia   . Aortic stenosis   . Arthritis    Back, knees  . Bowel incontinence   . Colon polyps   . Constipation   . Gastric ulcer    years ago  . Hypertension   . Hypokalemia   . Restless legs syndrome    on Requip  . Situational stress   . Spinal stenosis   . Stroke ([place])    Mini, no residual  . TIA (transient ischemic attack)   . Ulcer causing bleeding and hole in wall of stomach or small intestine   . Urinary bladder calculus   . Urinary incontinence   . Vertigo     Patient Active Problem List   Diagnosis Date Noted  . Aspiration pneumonia ([place]) 08/14/2016  . Chronic diastolic CHF (congestive heart failure) ([place]) 08/14/2016  . Chronic right shoulder pain 02/09/2016  . Bilateral lower extremity edema 02/02/2016  . Ileitis 01/19/2016  . Pressure ulcer 01/09/2016    . SBO (small bowel obstruction) 01/08/2016  . Narcotic addiction ([place]) 05/15/2015  . Diarrhea 03/30/2015  . Acute pharyngitis 02/26/2015  . Osteoarthritis of right knee 02/22/2015  . Pain in joint, lower leg 10/31/2014  . Ulcer of sacral region, stage 4 ([place]) 04/15/2014  . Moderate aortic stenosis 04/14/2014  . Pre-syncope 04/14/2014  . Hypotension 04/14/2014  . Acute renal failure superimposed on stage 2 chronic kidney disease ([place]) 04/14/2014  . Anemia 04/14/2014  . Protein-calorie malnutrition, severe ([place]) 04/14/2014  . Altered mental status   . Dehydration 04/13/2014  . Altered mental state 04/13/2014  . Acute kidney injury ([place])   . Hypokalemia 03/25/2014  . Syncope 03/16/2014  . Acute encephalopathy 03/16/2014  . Leg swelling 03/02/2014  . Avascular necrosis of bone of right hip ([place]) 09/27/2013  . Primary osteoarthritis of right hip 09/07/2013  . Spinal stenosis, lumbar region, with neurogenic claudication 09/07/2013  . Closed fracture of pubic ramus ([place]) 09/07/2013  . BPPV (benign paroxysmal positional vertigo) 09/07/2013  . Pharyngeal swelling 08/30/2013  . Pelvic fracture ([place]) 08/28/2013  . Fall 08/26/2013  . Dizziness 08/26/2013  . Abnormal stress test 08/26/2013  . Preop cardiovascular exam 08/01/2013  . Aortic stenosis 08/01/2013  . Edema 06/30/2013  . Loss of weight 01/04/2013  . Situational stress 01/04/2013  .  Chronic right hip pain 07/05/2012  . Knee pain, acute 06/29/2012  . Vertigo 06/24/2012  . Acute exacerbation of chronic low back pain 06/24/2012  . Lumbar pain with radiation down right leg 06/09/2012  . Osteoarthritis of hip 04/29/2012  . History of TIAs 04/17/2012  . S/P total hysterectomy and bilateral salpingo-oophorectomy 04/17/2012  . Hard of hearing 04/17/2012  . History of anemia 04/17/2012  . RLS (restless legs syndrome) 04/17/2012  . Chronic back pain 04/17/2012  . Spinal stenosis, lumbar 04/17/2012  . Lower back pain 12/20/2011   . Personal history of colonic polyps 10/08/2011  . H/O 09/14/2011  . Essential hypertension 09/14/2011  . Restless legs syndrome 09/14/2011  . Constipation 08/20/2011  . Urinary incontinence 08/20/2011    Past Surgical History:  Procedure Laterality Date  . COLON RESECTION  50 years ago  . LUMBAR LAMINECTOMY/DECOMPRESSION MICRODISCECTOMY  06/25/2011   Procedure: LUMBAR LAMINECTOMY/DECOMPRESSION MICRODISCECTOMY;  Surgeon: Hosie Spangle, MD;  Location: [facility];  Service: Neurosurgery;  Laterality: Right;  RIGHT Lumbar Two-Three hemilaminectomy and microdiskectomy  . TONSILLECTOMY    . UTERINE FIBROID SURGERY    . VAGINAL HYSTERECTOMY      OB History    No data available       Home Medications    Prior to Admission medications   Medication Sig Start Date End Date Taking? Authorizing Provider  acetaminophen (TYLENOL) 325 MG tablet Take 650 mg by mouth every 6 (six) hours as needed. Notify MD if not relieved. Not to exceed 3000 mg in 24 hour period   Yes Historical Provider, MD  amLODipine (NORVASC) 5 MG tablet Take 5 mg by mouth daily.   Yes Historical Provider, MD  feeding supplement (BOOST / RESOURCE BREEZE) LIQD Take 1 Container by mouth 2 (two) times daily between meals.   Yes Historical Provider, MD  HYDROcodone-acetaminophen (NORCO) 7.5-325 MG tablet Take 1 tablet by mouth every 6 (six) hours as needed for moderate pain.   Yes Historical Provider, MD  lubiprostone (AMITIZA) 24 MCG capsule Take 24 mcg by mouth 2 (two) times daily with a meal.    Yes Historical Provider, MD  magnesium hydroxide (MILK OF MAGNESIA) 400 MG/5ML suspension Take 30 mLs by mouth daily as needed for mild constipation. Reported on 07/27/2015   Yes Historical Provider, MD  mirabegron ER (MYRBETRIQ) 50 MG TB24 tablet Take 50 mg by mouth daily.   Yes Historical Provider, MD  mirtazapine (REMERON) 15 MG tablet Take 15 mg by mouth at bedtime.    Yes Historical Provider, MD  morphine (MS CONTIN) 15 MG 12  hr tablet Take 15 mg by mouth 2 (two) times daily.   Yes Historical Provider, MD  Multiple Vitamin (MULTIVITAMIN) tablet Take 1 tablet by mouth daily.   Yes Historical Provider, MD  ondansetron (ZOFRAN) 4 MG tablet Take 4 mg by mouth every 12 (twelve) hours as needed for nausea or vomiting.   Yes Historical Provider, MD  pantoprazole (PROTONIX) 40 MG tablet Take 40 mg by mouth daily.    Yes Historical Provider, MD  Phenol-Glycerin (CHLORASEPTIC MAX SORE THROAT) 1.5-33 % LIQD Use as directed 1 spray in the mouth or throat 3 (three) times daily as needed. For throat irritation/pain. If sore throat persist notify the MD   Yes Historical Provider, MD  potassium chloride (K-DUR) 10 MEQ tablet Take 20 mEq by mouth daily. 2 tablets   Yes Historical Provider, MD  Propylene Glycol (SYSTANE BALANCE) 0.6 % SOLN Place 1 drop into both eyes  2 (two) times daily.   Yes Historical Provider, MD  rOPINIRole (REQUIP) 4 MG tablet Take 4 mg by mouth at bedtime.   Yes Historical Provider, MD  torsemide (DEMADEX) 20 MG tablet Take 40 mg by mouth every morning. 2 tabs   Yes Historical Provider, MD    Family History Family History  Problem Relation Age of Onset  . Dementia Mother   . Stroke Father   . Pancreatic cancer Brother   . Anesthesia problems Neg Hx   . Colon cancer Neg Hx   . Esophageal cancer Neg Hx   . Stomach cancer Neg Hx   . Diabetes Neg Hx   . Kidney disease Neg Hx   . Liver disease Neg Hx     Social History Social History  Substance Use Topics  . Smoking status: Never Smoker  . Smokeless tobacco: Never Used  . Alcohol use 0.0 oz/week     Comment: rarely     Allergies   Amoxicillin; Baclofen; Hctz [hydrochlorothiazide]; and Valium [diazepam]   Review of Systems Review of Systems  Constitutional: Positive for fatigue. Negative for chills and fever.  HENT: Negative for congestion and rhinorrhea.   Eyes: Negative for visual disturbance.  Respiratory: Negative for cough, shortness of  breath and wheezing.   Cardiovascular: Negative for chest pain and leg swelling.  Gastrointestinal: Positive for abdominal pain, nausea and vomiting. Negative for diarrhea.  Genitourinary: Negative for dysuria and flank pain.  Musculoskeletal: Negative for neck pain and neck stiffness.  Skin: Negative for rash and wound.  Allergic/Immunologic: Negative for immunocompromised state.  Neurological: Positive for light-headedness. Negative for syncope, weakness and headaches.  All other systems reviewed and are negative.    Physical Exam Updated Vital Signs BP (!) 147/69 (BP Location: Right Arm)   Pulse 91   Temp 98.3 F (36.8 C) (Oral)   Resp 18   Wt 109 lb 8 oz (49.7 kg)   SpO2 93%   BMI 21.39 kg/m   Physical Exam  Constitutional: She is oriented to person, place, and time. She appears well-developed and well-nourished. No distress.  HENT:  Head: Normocephalic and atraumatic.  Eyes: Conjunctivae are normal.  Neck: Neck supple.  Cardiovascular: Normal rate, regular rhythm and normal heart sounds.  Exam reveals no friction rub.   No murmur heard. Pulmonary/Chest: Effort normal and breath sounds normal. No respiratory distress. She has no wheezes. She has no rales.  Abdominal: Soft. Normal appearance. She exhibits distension. There is generalized tenderness. There is no rigidity, no rebound, no guarding and no CVA tenderness.  Musculoskeletal: She exhibits no edema.  Neurological: She is alert and oriented to person, place, and time. She exhibits normal muscle tone.  Skin: Skin is warm. Capillary refill takes less than 2 seconds.  Psychiatric: She has a normal mood and affect.  Nursing note and vitals reviewed.    ED Treatments / Results  Labs (all labs ordered are listed, but only abnormal results are displayed) Labs Reviewed  CBC WITH DIFFERENTIAL/PLATELET - Abnormal; Notable for the following:       Result Value   WBC 21.1 (*)    RBC 3.83 (*)    Hemoglobin 9.4 (*)     HCT 31.3 (*)    MCH 24.5 (*)    RDW 15.7 (*)    Neutro Abs 19.6 (*)    All other components within normal limits  COMPREHENSIVE METABOLIC PANEL - Abnormal; Notable for the following:    Chloride 88 (*)  CO2 38 (*)    Glucose, Bld 144 (*)    BUN 37 (*)    Creatinine, Ser 2.27 (*)    Albumin 3.4 (*)    GFR calc non Af Amer 17 (*)    GFR calc Af Amer 20 (*)    All other components within normal limits  URINALYSIS, ROUTINE W REFLEX MICROSCOPIC - Abnormal; Notable for the following:    Leukocytes, UA TRACE (*)    Squamous Epithelial / LPF 0-5 (*)    All other components within normal limits  APTT - Abnormal; Notable for the following:    aPTT 40 (*)    All other components within normal limits  CULTURE, BLOOD (ROUTINE X 2)  CULTURE, BLOOD (ROUTINE X 2)  URINE CULTURE  CULTURE, EXPECTORATED SPUTUM-ASSESSMENT  GRAM STAIN  MRSA PCR SCREENING  PROCALCITONIN  PROTIME-INR  SODIUM, URINE, RANDOM  UREA NITROGEN, URINE  BRAIN NATRIURETIC PEPTIDE  HIV ANTIBODY (ROUTINE TESTING)  STREP PNEUMONIAE URINARY ANTIGEN  LEGIONELLA PNEUMOPHILA SEROGP 1 UR AG  BASIC METABOLIC PANEL  CBC  I-STAT CG4 LACTIC ACID, ED  I-STAT TROPOININ, ED  I-STAT CG4 LACTIC ACID, ED    EKG  EKG Interpretation  Date/Time:  Thursday August 14 2016 15:17:59 EDT Ventricular Rate:  76 PR Interval:    QRS Duration: 96 QT Interval:  392 QTC Calculation: 441 R Axis:   -160 Text Interpretation:  Right and left arm electrode reversal, interpretation assumes no reversal Sinus rhythm Probable left atrial enlargement Probable lateral infarct, age indeterminate Anteroseptal infarct, old Baseline wander in lead(s) V4 Since last EKG TWi in leads I, II, aVL, and AVF are new No other acute St elevations or depressions Confirmed by Ellender Hose MD, Samie Reasons [phone]) on 08/14/2016 3:36:12 PM       Radiology Ct Abdomen Pelvis Wo Contrast  Result Date: 08/14/2016 CLINICAL DATA:  Nausea and vomiting. Abdominal pain after starting  Phenergan. EXAM: CT ABDOMEN AND PELVIS WITHOUT CONTRAST TECHNIQUE: Multidetector CT imaging of the abdomen and pelvis was performed following the standard protocol without IV contrast. COMPARISON:  01/08/2016. FINDINGS: Lower chest: Patchy airspace opacity in the right middle lobe and right lower lobe. Mild left lower lobe atelectasis. No pleural fluid. Hepatobiliary: Mildly distended gallbladder containing sludge and a 9 mm gallstone. No gallbladder wall thickening or pericholecystic fluid seen. Unremarkable liver. Pancreas: Unremarkable. No pancreatic ductal dilatation or surrounding inflammatory changes. Spleen: Normal in size without focal abnormality. Adrenals/Urinary Tract: 2 tiny lower pole left renal calculi. Interval minimally dilated ureters and collecting systems. Distended urinary bladder. No urinary tract calculi seen. Stomach/Bowel: Progressive dilatation of the stomach and similar degree of dilatation of the proximal small bowel. Normal caliber distal small bowel with some fecalized bowel contents. No pneumatosis or wall thickening. No well-defined transition point. Inferior displacement of the rectum. Prominent stool throughout normal caliber colon. No evidence of appendicitis. Vascular/Lymphatic: Atheromatous arterial calcifications, including the abdominal aorta and dense coronary artery calcifications. No enlarged lymph nodes. Reproductive: Surgically absent uterus.  No adnexal masses. Other: Small amount of free peritoneal fluid. Mild diffuse subcutaneous edema. Musculoskeletal: Marked right hip degenerative changes with joint space narrowing and subarticular cyst formation. Lumbar and lower thoracic spine degenerative changes. IMPRESSION: 1. Recurrent partial small bowel obstruction. The cause of obstruction is not identified. Reactive ileus due to right lung pneumonia is also a possibility. 2. Patchy airspace opacity in the right middle lobe and right lower lobe suspicious for pneumonia. 3.  Rectocele. 4. 2 tiny, nonobstructing lower pole left renal  calculi. 5. Interval minimal bilateral hydronephrosis, possibly mechanical due to mild distention of the urinary bladder. 6. Cholelithiasis and sludge in the gallbladder without evidence of cholecystitis. 7. Densely calcified coronary artery and aortic atherosclerosis. Electronically Signed   By: Claudie Revering M.D.   On: 08/14/2016 18:22   Dg Abd Portable 1v  Result Date: 08/14/2016 CLINICAL DATA:  NG tube placement EXAM: PORTABLE ABDOMEN - 1 VIEW COMPARISON:  08/14/2016 FINDINGS: Esophageal tube tip is folded upon itself in the gastric fundus. Multiple dilated loops of small bowel up to 5.7 cm, consistent with small bowel obstruction. IMPRESSION: Esophageal tube tip overlies the proximal stomach. Electronically Signed   By: Donavan Foil M.D.   On: 08/14/2016 20:49    Procedures Procedures (including critical care time)  Medications Ordered in ED Medications  vancomycin (VANCOCIN) 500 mg in sodium chloride 0.9 % 100 mL IVPB (not administered)  ceFEPIme (MAXIPIME) 500 mg in dextrose 5 % 50 mL IVPB (0 mg Intravenous Stopped 08/14/16 2057)  ondansetron (ZOFRAN) injection 4 mg (not administered)  morphine (MS CONTIN) 12 hr tablet 15 mg (15 mg Oral Not Given 08/14/16 2244)  rOPINIRole (REQUIP) tablet 4 mg (4 mg Oral Given 08/14/16 2244)  mirtazapine (REMERON) tablet 15 mg (15 mg Oral Not Given 08/14/16 2245)  0.9 %  sodium chloride infusion ( Intravenous New Bag/Given 08/14/16 2110)  pantoprazole (PROTONIX) injection 40 mg (40 mg Intravenous Given 08/14/16 2243)  hydrALAZINE (APRESOLINE) injection 5 mg (not administered)  albuterol (PROVENTIL) (2.5 MG/3ML) 0.083% nebulizer solution 2.5 mg (not administered)  metroNIDAZOLE (FLAGYL) IVPB 500 mg (500 mg Intravenous Given 08/14/16 2243)  sodium chloride flush (NS) 0.9 % injection 3 mL (3 mLs Intravenous Not Given 08/14/16 2244)  acetaminophen (TYLENOL) tablet 650 mg (not administered)    Or    acetaminophen (TYLENOL) suppository 650 mg (not administered)  zolpidem (AMBIEN) tablet 5 mg (not administered)  morphine 4 MG/ML injection 2 mg (not administered)  sodium chloride 0.9 % bolus 1,000 mL (0 mLs Intravenous Stopped 08/14/16 1808)  ondansetron (ZOFRAN) injection 4 mg (4 mg Intravenous Given 08/14/16 1626)  fentaNYL (SUBLIMAZE) injection 25 mcg (25 mcg Intravenous Given 08/14/16 1628)  sodium chloride 0.9 % bolus 1,000 mL (0 mLs Intravenous Stopped 08/14/16 1912)  ondansetron (ZOFRAN) injection 4 mg (4 mg Intravenous Given 08/14/16 1909)  fentaNYL (SUBLIMAZE) injection 25 mcg (25 mcg Intravenous Given 08/14/16 1910)  vancomycin (VANCOCIN) IVPB 750 mg/150 ml premix (750 mg Intravenous Given 08/14/16 2059)     Initial Impression / Assessment and Plan / ED Course  I have reviewed the triage vital signs and the nursing notes.  Pertinent labs & imaging results that were available during my care of the patient were reviewed by me and considered in my medical decision making (see chart for details).    83 yo F with PMHx as above here with abdominal pain, nausea, vomiting, and constipation. On arrival, VSS and WNL. Exam as above. Labwork remarkable for significant leukocytosis and dehydration with acute kidney injury. Potassium within normal limits. CT abdomen and pelvis obtained and shows bowel obstruction without perforation. NG tube placed and I have discussed with surgery. Will continue fluid resuscitate and admit to medicine.  Final Clinical Impressions(s) / ED Diagnoses   Final diagnoses:  SBO (small bowel obstruction)  Other elevated white blood cell (WBC) count  Aspiration pneumonia, unspecified aspiration pneumonia type, unspecified laterality, unspecified part of lung [facility])    New Prescriptions Current Discharge Medication List  Duffy Bruce, MD 08/14/16 [phone]

## 2016-08-14 NOTE — H&P (Addendum)
History and Physical    Shelly Martin YTK:[id number] DOB: 1918/09/04 DOA: 08/14/2016  Referring MD/NP/PA:   PCP: Inocencio Homes, MD   Patient coming from:  The patient is coming from retirement facility.  At baseline, pt is independent for most of ADL.  Chief Complaint: Nausea, vomiting, abdominal pain  HPI: Shelly Martin is a 83 y.o. female with medical history significant of peptic ulcer disease with perforation, colon resection, hypertension, GERD, depression, vertigo, urinary incontinence, TIA, stroke, restless leg syndrome, aortic stenosis anemia, chronic kidney disease-stage II, dCHF, who presents with nausea, vomiting, abdominal pain.  Patient states that she has been having nausea, vomiting and mild abdominal pain in the past several days, which has been progressively getting worse. No diarrhea. She states that she vomited at least a 9 times without blood in the vomitus today. She has been unable to eat or drink. Denies fever or chills. She was seen by a PA at her facility who prescribed Phenergan which did not improve her symptoms. Her last bowel movement was yesterday. Patient denies cough, chest pain, shortness of breath. She has a urinary incontinence, no dysuria or burning on urination. She moves all extremities normally.  ED Course: pt was found to have WBC 21.1, lactate is 1.12, negative troponin, urinalysis with trace amount of leukocytes, worsening renal function, temperature 99.3, no tachycardia, oxygen saturation 94% on room air. Pt patient isn't admitted to telemetry bed as inpatient. Gen. surgeon, Dr. Renard Hamper female was consulted.  # CT abdomen/pelvis showed 1. Recurrent partial small bowel obstruction. The cause of obstruction is not identified. Reactive ileus due to right lung pneumonia is also a possibility. 2. Patchy airspace opacity in the right middle lobe and right lower lobe suspicious for pneumonia. 3. Rectocele. 4. 2 tiny, nonobstructing lower pole left  renal calculi. 5. Interval minimal bilateral hydronephrosis, possibly mechanical due to mild distention of the urinary bladder. 6. Cholelithiasis and sludge in the gallbladder without evidence of cholecystitis. 7. Densely calcified coronary artery and aortic atherosclerosis.  Review of Systems:   General: no fevers, chills, no changes in body weight, has poor appetite, has fatigue HEENT: no blurry vision, hearing changes or sore throat Respiratory: no dyspnea, coughing, wheezing CV: no chest pain, no palpitations GI: has nausea, vomiting, abdominal pain, constipation. No diarrhea, GU: no dysuria, burning on urination, increased urinary frequency, hematuria  Ext: no leg edema Neuro: no unilateral weakness, numbness, or tingling, no vision change or hearing loss Skin: no rash MSK: No muscle spasm, no deformity, no limitation of range of movement in spin Heme: No easy bruising.  Travel history: No recent long distant travel.  Allergy:  Allergies  Allergen Reactions  . Amoxicillin Other (See Comments)    Reaction unknown  . Baclofen Other (See Comments)    "fuzzy in the eyes" and dizzy  . Hctz [Hydrochlorothiazide]     nausea  . Valium [Diazepam] Other (See Comments)    Pt became unresponsive and O2 sats dropped.    Past Medical History:  Diagnosis Date  . Acute encephalopathy   . AKI (acute kidney injury) ([place])   . Altered mental state   . Anemia   . Aortic stenosis   . Arthritis    Back, knees  . Bowel incontinence   . Colon polyps   . Constipation   . Gastric ulcer    years ago  . Hypertension   . Hypokalemia   . Restless legs syndrome    on Requip  . Situational stress   .  Spinal stenosis   . Stroke ([place])    Mini, no residual  . TIA (transient ischemic attack)   . Ulcer causing bleeding and hole in wall of stomach or small intestine   . Urinary bladder calculus   . Urinary incontinence   . Vertigo     Past Surgical History:  Procedure Laterality Date    . COLON RESECTION  50 years ago  . LUMBAR LAMINECTOMY/DECOMPRESSION MICRODISCECTOMY  06/25/2011   Procedure: LUMBAR LAMINECTOMY/DECOMPRESSION MICRODISCECTOMY;  Surgeon: Hosie Spangle, MD;  Location: [facility];  Service: Neurosurgery;  Laterality: Right;  RIGHT Lumbar Two-Three hemilaminectomy and microdiskectomy  . TONSILLECTOMY    . UTERINE FIBROID SURGERY    . VAGINAL HYSTERECTOMY      Social History:  reports that she has never smoked. She has never used smokeless tobacco. She reports that she drinks alcohol. She reports that she does not use drugs.  Family History:  Family History  Problem Relation Age of Onset  . Dementia Mother   . Stroke Father   . Pancreatic cancer Brother   . Anesthesia problems Neg Hx   . Colon cancer Neg Hx   . Esophageal cancer Neg Hx   . Stomach cancer Neg Hx   . Diabetes Neg Hx   . Kidney disease Neg Hx   . Liver disease Neg Hx      Prior to Admission medications   Medication Sig Start Date End Date Taking? Authorizing Provider  acetaminophen (TYLENOL) 325 MG tablet Take 650 mg by mouth every 6 (six) hours as needed. Notify MD if not relieved. Not to exceed 3000 mg in 24 hour period    Historical Provider, MD  amLODipine (NORVASC) 5 MG tablet Take 5 mg by mouth daily.    Historical Provider, MD  feeding supplement (BOOST / RESOURCE BREEZE) LIQD Take 1 Container by mouth 2 (two) times daily between meals.    Historical Provider, MD  lubiprostone (AMITIZA) 24 MCG capsule Take 24 mcg by mouth 2 (two) times daily with a meal.     Historical Provider, MD  magnesium hydroxide (MILK OF MAGNESIA) 400 MG/5ML suspension Take 30 mLs by mouth daily as needed for mild constipation. Reported on 07/27/2015    Historical Provider, MD  mirabegron ER (MYRBETRIQ) 50 MG TB24 tablet Take 50 mg by mouth daily.    Historical Provider, MD  mirtazapine (REMERON) 15 MG tablet Take 15 mg by mouth at bedtime.     Historical Provider, MD  morphine (MS CONTIN) 15 MG 12 hr  tablet Take 15 mg by mouth 2 (two) times daily.    Historical Provider, MD  Multiple Vitamin (MULTIVITAMIN) tablet Take 1 tablet by mouth daily.    Historical Provider, MD  ondansetron (ZOFRAN) 4 MG tablet Take 4 mg by mouth every 12 (twelve) hours as needed for nausea or vomiting.    Historical Provider, MD  pantoprazole (PROTONIX) 40 MG tablet Take 40 mg by mouth daily.     Historical Provider, MD  Phenol-Glycerin (CHLORASEPTIC MAX SORE THROAT) 1.5-33 % LIQD Use as directed 1 spray in the mouth or throat 3 (three) times daily as needed. For throat irritation/pain. If sore throat persist notify the MD    Historical Provider, MD  potassium chloride (K-DUR) 10 MEQ tablet Take 20 mEq by mouth daily. 2 tablets    Historical Provider, MD  Propylene Glycol (SYSTANE BALANCE) 0.6 % SOLN Place 1 drop into both eyes 2 (two) times daily.    Historical Provider, MD  rOPINIRole (REQUIP) 4 MG tablet Take 4 mg by mouth at bedtime.    Historical Provider, MD  torsemide (DEMADEX) 20 MG tablet Take 40 mg by mouth every morning. 2 tabs    Historical Provider, MD    Physical Exam: Vitals:   08/14/16 2015 08/14/16 2045 08/14/16 2057 08/14/16 2102  BP: 129/66 127/66    Pulse: 84 90    Resp:      Temp:    99.2 F (37.3 C)  TempSrc:    Oral  SpO2: 100% 90% 96%    General: Not in acute distress HEENT:       Eyes: PERRL, EOMI, no scleral icterus.       ENT: No discharge from the ears and nose, no pharynx injection, no tonsillar enlargement.        Neck: No JVD, no bruit, no mass felt. Heme: No neck lymph node enlargement. Cardiac: S1/S2, RRR, No murmurs, No gallops or rubs. Respiratory:  No rales, wheezing, rhonchi or rubs. GI:  distended, mild tenderness diffusely, no rebound pain, no organomegaly, BS present. GU: No hematuria Ext: No pitting leg edema bilaterally. 2+DP/PT pulse bilaterally. Musculoskeletal: No joint deformities, No joint redness or warmth, no limitation of ROM in spin. Skin: No rashes.  Has pressure ulcer Neuro: Alert, oriented X3, cranial nerves II-XII grossly intact, moves all extremities normally.  Psych: Patient is not psychotic, no suicidal or hemocidal ideation.  Labs on Admission: I have personally reviewed following labs and imaging studies  CBC:  Recent Labs Lab 08/14/16 1519  WBC 21.1*  NEUTROABS 19.6*  HGB 9.4*  HCT 31.3*  MCV 81.7  PLT 093   Basic Metabolic Panel:  Recent Labs Lab 08/14/16 1519  NA 137  K 4.3  CL 88*  CO2 38*  GLUCOSE 144*  BUN 37*  CREATININE 2.27*  CALCIUM 9.1   GFR: Estimated Creatinine Clearance: 9.9 mL/min (A) (by C-G formula based on SCr of 2.27 mg/dL (H)). Liver Function Tests:  Recent Labs Lab 08/14/16 1519  AST 28  ALT 17  ALKPHOS 107  BILITOT 1.1  PROT 6.6  ALBUMIN 3.4*   No results for input(s): LIPASE, AMYLASE in the last 168 hours. No results for input(s): AMMONIA in the last 168 hours. Coagulation Profile: No results for input(s): INR, PROTIME in the last 168 hours. Cardiac Enzymes: No results for input(s): CKTOTAL, CKMB, CKMBINDEX, TROPONINI in the last 168 hours. BNP (last 3 results) No results for input(s): PROBNP in the last 8760 hours. HbA1C: No results for input(s): HGBA1C in the last 72 hours. CBG: No results for input(s): GLUCAP in the last 168 hours. Lipid Profile: No results for input(s): CHOL, HDL, LDLCALC, TRIG, CHOLHDL, LDLDIRECT in the last 72 hours. Thyroid Function Tests: No results for input(s): TSH, T4TOTAL, FREET4, T3FREE, THYROIDAB in the last 72 hours. Anemia Panel: No results for input(s): VITAMINB12, FOLATE, FERRITIN, TIBC, IRON, RETICCTPCT in the last 72 hours. Urine analysis:    Component Value Date/Time   COLORURINE YELLOW 08/14/2016 1750   APPEARANCEUR CLEAR 08/14/2016 1750   LABSPEC 1.009 08/14/2016 1750   PHURINE 7.0 08/14/2016 1750   GLUCOSEU NEGATIVE 08/14/2016 1750   HGBUR NEGATIVE 08/14/2016 1750   BILIRUBINUR NEGATIVE 08/14/2016 1750   BILIRUBINUR  neg 04/15/2012 1702   KETONESUR NEGATIVE 08/14/2016 1750   PROTEINUR NEGATIVE 08/14/2016 1750   UROBILINOGEN 0.2 05/25/2014 0327   NITRITE NEGATIVE 08/14/2016 1750   LEUKOCYTESUR TRACE (A) 08/14/2016 1750   Sepsis Labs: @LABRCNTIP (procalcitonin:4,lacticidven:4) ) Recent Results (from the past  240 hour(s))  Blood culture (routine x 2)     Status: None (Preliminary result)   Collection Time: 08/14/16  8:00 PM  Result Value Ref Range Status   Specimen Description BLOOD RIGHT HAND  Final   Special Requests IN PEDIATRIC BOTTLE 1CC  Final   Culture PENDING  Incomplete   Report Status PENDING  Incomplete     Radiological Exams on Admission: Ct Abdomen Pelvis Wo Contrast  Result Date: 08/14/2016 CLINICAL DATA:  Nausea and vomiting. Abdominal pain after starting Phenergan. EXAM: CT ABDOMEN AND PELVIS WITHOUT CONTRAST TECHNIQUE: Multidetector CT imaging of the abdomen and pelvis was performed following the standard protocol without IV contrast. COMPARISON:  01/08/2016. FINDINGS: Lower chest: Patchy airspace opacity in the right middle lobe and right lower lobe. Mild left lower lobe atelectasis. No pleural fluid. Hepatobiliary: Mildly distended gallbladder containing sludge and a 9 mm gallstone. No gallbladder wall thickening or pericholecystic fluid seen. Unremarkable liver. Pancreas: Unremarkable. No pancreatic ductal dilatation or surrounding inflammatory changes. Spleen: Normal in size without focal abnormality. Adrenals/Urinary Tract: 2 tiny lower pole left renal calculi. Interval minimally dilated ureters and collecting systems. Distended urinary bladder. No urinary tract calculi seen. Stomach/Bowel: Progressive dilatation of the stomach and similar degree of dilatation of the proximal small bowel. Normal caliber distal small bowel with some fecalized bowel contents. No pneumatosis or wall thickening. No well-defined transition point. Inferior displacement of the rectum. Prominent stool throughout  normal caliber colon. No evidence of appendicitis. Vascular/Lymphatic: Atheromatous arterial calcifications, including the abdominal aorta and dense coronary artery calcifications. No enlarged lymph nodes. Reproductive: Surgically absent uterus.  No adnexal masses. Other: Small amount of free peritoneal fluid. Mild diffuse subcutaneous edema. Musculoskeletal: Marked right hip degenerative changes with joint space narrowing and subarticular cyst formation. Lumbar and lower thoracic spine degenerative changes. IMPRESSION: 1. Recurrent partial small bowel obstruction. The cause of obstruction is not identified. Reactive ileus due to right lung pneumonia is also a possibility. 2. Patchy airspace opacity in the right middle lobe and right lower lobe suspicious for pneumonia. 3. Rectocele. 4. 2 tiny, nonobstructing lower pole left renal calculi. 5. Interval minimal bilateral hydronephrosis, possibly mechanical due to mild distention of the urinary bladder. 6. Cholelithiasis and sludge in the gallbladder without evidence of cholecystitis. 7. Densely calcified coronary artery and aortic atherosclerosis. Electronically Signed   By: Claudie Revering M.D.   On: 08/14/2016 18:22   Dg Abd Portable 1v  Result Date: 08/14/2016 CLINICAL DATA:  NG tube placement EXAM: PORTABLE ABDOMEN - 1 VIEW COMPARISON:  08/14/2016 FINDINGS: Esophageal tube tip is folded upon itself in the gastric fundus. Multiple dilated loops of small bowel up to 5.7 cm, consistent with small bowel obstruction. IMPRESSION: Esophageal tube tip overlies the proximal stomach. Electronically Signed   By: Donavan Foil M.D.   On: 08/14/2016 20:49     EKG: Independently reviewed.  Sinus rhythm, QTC 441, poor R-wave progression, anteroseptal infarction pattern.  Assessment/Plan Principal Problem:   SBO (small bowel obstruction) Active Problems:   H/O   Essential hypertension   History of TIAs   Acute renal failure superimposed on stage 2 chronic kidney  disease (HCC)   Anemia   Narcotic addiction (HCC)   Aspiration pneumonia (HCC)   Chronic diastolic CHF (congestive heart failure) (HCC)   SBO (small bowel obstruction): CT scan showed recurrent partial small bowel obstruction. Likely due to adhesion secondary to previous abdominal surgery.Per radiologist, reactive ileus due to right lung pneumonia is also a possibility. Patient has  leukocytosis, temperature 99.3, lactate is norma, not meet criteria for sepsis, but pt is at risk of developing sepsis. Currently hemodynamically stable. Gen. surgeon, Dr. Rush Farmer was consulted by EDP.  -Admit to med surg bed as inpt -NPO  -prn NG tube -morphine prn pain -Prn Zofran prn nausea  -IVF: 2L NS and then 75 cc/h -INR/PTT -Follow-up general surgeons recommendation  Possible  Aspiration pneumonia: CT abdomen/pelvis also showed infiltration in RML and RLL, which is likely due to aspiration. Pt denies respiratory symptoms. Oxygen saturation 94% on room air. As discussed above, patient is at risk of developing sepsis. - IV Vancomycin, cefepime, flagyl were started in ED, will continue for now - prn Albuterol Nebs for SOB - Urine legionella and S. pneumococcal antigen - Follow up blood culture x2, sputum culture  HTN: -hold oral amlodipine and demadex due to NG tube placement -IV hydralazine when necessary  GERD: -Protonix IV  Hx of TIA: not on medications -no acute issues -observe  Narcotic addiction: -On MS contin -prn morphine  Acute renal failure superimposed on stage 2 chronic kidney disease: Baseline creatinine 1.2-1.4. Her creatinine 2.27, BUN 37. Likely due to prerenal secondary to dehydration and continuation of diuretics. - IVF as above - Check FeUrea - Follow up renal function by BMP - Avoid ACEI and NSAIDs - Hold Diuretics, Demadex  Anemia: Hemoglobin 9.4, which is her baseline. Her hemoglobin was 8.5 on 04/26/16 -Follow-up by CBC  Depression: Stable, no suicidal or  homicidal ideations. -Continue home medications: Mirtazapine, Requip  Chronic diastolic congestive heart failure: 2-D echo on 03/17/14 showed EF of 52% with diastolic dysfunction. Patient does not have leg edema or JVD. CHF is compensated. -Hold Demadex due to worsening renal function -Check BNP  Pressure ulcer: -wound care consult   DVT ppx: SCD Code Status: Full code Family Communication: None at bed side.  Disposition Plan:  Anticipate discharge back to previous retirement environment Consults called:  Gen. Psychologist, sport and exercise, Dr. Rush Farmer Admission status:  Inpatient/tele   Date of Service 08/14/2016    Ivor Costa [facility] Pager [phone]  If 7PM-7AM, please contact night-coverage [email or web address] Password [facility] 08/14/2016, 9:26 PM

## 2016-08-14 NOTE — ED Notes (Signed)
[facility] at bedside

## 2016-08-14 NOTE — ED Notes (Signed)
Pt's sats RA is 88%, placed on non-rebreather

## 2016-08-14 NOTE — Consult Note (Signed)
Reason for Consult:SBO Referring Physician: Dr. Ivor Costa  Shelly Martin is an 83 y.o. female.  HPI: This is a 83 year old female with multiple chronic medical problems who presents with nausea, vomiting, abdominal pain. [facility] saw her as a consult last fall with a partial small bowel obstruction. She reports a several-day history of nausea, vomiting, and midabdominal pain. Her last normal bowel movement was this morning. She has had anorexia. She is currently at assisted living facility. She now has a nasogastric tube in place and denies abdominal pain. She underwent a CT scan showing a dilated stomach and proximal small bowel with collapsed distal small bowel. She has a previous history of a partial colectomy. Her CT scan also showed findings consistent with pneumonia.  Past Medical History:  Diagnosis Date  . Acute encephalopathy   . AKI (acute kidney injury) ([place])   . Altered mental state   . Anemia   . Aortic stenosis   . Arthritis    Back, knees  . Bowel incontinence   . Colon polyps   . Constipation   . Gastric ulcer    years ago  . Hypertension   . Hypokalemia   . Restless legs syndrome    on [place]  . Situational stress   . Spinal stenosis   . Stroke ([place])    Mini, no residual  . TIA (transient ischemic attack)   . Ulcer causing bleeding and hole in wall of stomach or small intestine   . Urinary bladder calculus   . Urinary incontinence   . Vertigo     Past Surgical History:  Procedure Laterality Date  . COLON RESECTION  50 years ago  . LUMBAR LAMINECTOMY/DECOMPRESSION MICRODISCECTOMY  06/25/2011   Procedure: LUMBAR LAMINECTOMY/DECOMPRESSION MICRODISCECTOMY;  Surgeon: Hosie Spangle, MD;  Location: [facility];  Service: Neurosurgery;  Laterality: Right;  RIGHT Lumbar Two-Three hemilaminectomy and microdiskectomy  . TONSILLECTOMY    . UTERINE FIBROID SURGERY    . VAGINAL HYSTERECTOMY      Family History  Problem Relation Age of Onset  . Dementia Mother    . Stroke Father   . Pancreatic cancer Brother   . Anesthesia problems Neg Hx   . Colon cancer Neg Hx   . Esophageal cancer Neg Hx   . Stomach cancer Neg Hx   . Diabetes Neg Hx   . Kidney disease Neg Hx   . Liver disease Neg Hx     Social History:  reports that she has never smoked. She has never used smokeless tobacco. She reports that she drinks alcohol. She reports that she does not use drugs.  Allergies:  Allergies  Allergen Reactions  . Amoxicillin Other (See Comments)    Reaction unknown  . Baclofen Other (See Comments)    "fuzzy in the eyes" and dizzy  . Hctz [Hydrochlorothiazide]     nausea  . Valium [Diazepam] Other (See Comments)    Pt became unresponsive and O2 sats dropped.    Medications: I have reviewed the patient's current medications.  Results for orders placed or performed during the hospital encounter of 08/14/16 (from the past 48 hour(s))  CBC with Differential     Status: Abnormal   Collection Time: 08/14/16  3:19 PM  Result Value Ref Range   WBC 21.1 (H) 4.0 - 10.5 K/uL   RBC 3.83 (L) 3.87 - 5.11 MIL/uL   Hemoglobin 9.4 (L) 12.0 - 15.0 g/dL   HCT 31.3 (L) 36.0 - 46.0 %   MCV 81.7  78.0 - 100.0 fL   MCH 24.5 (L) 26.0 - 34.0 pg   MCHC 30.0 30.0 - 36.0 g/dL   RDW 15.7 (H) 11.5 - 15.5 %   Platelets 300 150 - 400 K/uL   Neutrophils Relative % 93 %   Neutro Abs 19.6 (H) 1.7 - 7.7 K/uL   Lymphocytes Relative 4 %   Lymphs Abs 0.9 0.7 - 4.0 K/uL   Monocytes Relative 3 %   Monocytes Absolute 0.7 0.1 - 1.0 K/uL   Eosinophils Relative 0 %   Eosinophils Absolute 0.0 0.0 - 0.7 K/uL   Basophils Relative 0 %   Basophils Absolute 0.0 0.0 - 0.1 K/uL  Comprehensive metabolic panel     Status: Abnormal   Collection Time: 08/14/16  3:19 PM  Result Value Ref Range   Sodium 137 135 - 145 mmol/L   Potassium 4.3 3.5 - 5.1 mmol/L   Chloride 88 (L) 101 - 111 mmol/L   CO2 38 (H) 22 - 32 mmol/L   Glucose, Bld 144 (H) 65 - 99 mg/dL   BUN 37 (H) 6 - 20 mg/dL    Creatinine, Ser 2.27 (H) 0.44 - 1.00 mg/dL   Calcium 9.1 8.9 - 10.3 mg/dL   Total Protein 6.6 6.5 - 8.1 g/dL   Albumin 3.4 (L) 3.5 - 5.0 g/dL   AST 28 15 - 41 U/L   ALT 17 14 - 54 U/L   Alkaline Phosphatase 107 38 - 126 U/L   Total Bilirubin 1.1 0.3 - 1.2 mg/dL   GFR calc non Af Amer 17 (L) >60 mL/min   GFR calc Af Amer 20 (L) >60 mL/min    Comment: (NOTE) The eGFR has been calculated using the CKD EPI equation. This calculation has not been validated in all clinical situations. eGFR's persistently <60 mL/min signify possible Chronic Kidney Disease.    Anion gap 11 5 - 15  I-Stat Troponin, ED (not at [facility])     Status: None   Collection Time: 08/14/16  3:39 PM  Result Value Ref Range   Troponin i, poc 0.00 0.00 - 0.08 ng/mL   Comment 3            Comment: Due to the release kinetics of cTnI, a negative result within the first hours of the onset of symptoms does not rule out myocardial infarction with certainty. If myocardial infarction is still suspected, repeat the test at appropriate intervals.   I-Stat CG4 Lactic Acid, ED     Status: None   Collection Time: 08/14/16  3:41 PM  Result Value Ref Range   Lactic Acid, Venous 1.12 0.5 - 1.9 mmol/L  Urinalysis, Routine w reflex microscopic     Status: Abnormal   Collection Time: 08/14/16  5:50 PM  Result Value Ref Range   Color, Urine YELLOW YELLOW   APPearance CLEAR CLEAR   Specific Gravity, Urine 1.009 1.005 - 1.030   pH 7.0 5.0 - 8.0   Glucose, UA NEGATIVE NEGATIVE mg/dL   Hgb urine dipstick NEGATIVE NEGATIVE   Bilirubin Urine NEGATIVE NEGATIVE   Ketones, ur NEGATIVE NEGATIVE mg/dL   Protein, ur NEGATIVE NEGATIVE mg/dL   Nitrite NEGATIVE NEGATIVE   Leukocytes, UA TRACE (A) NEGATIVE   RBC / HPF 0-5 0 - 5 RBC/hpf   WBC, UA 6-30 0 - 5 WBC/hpf   Bacteria, UA NONE SEEN NONE SEEN   Squamous Epithelial / LPF 0-5 (A) NONE SEEN   Hyaline Casts, UA PRESENT   I-Stat CG4 Lactic Acid,  ED     Status: None   Collection Time:  08/14/16  8:04 PM  Result Value Ref Range   Lactic Acid, Venous 0.90 0.5 - 1.9 mmol/L    Ct Abdomen Pelvis Wo Contrast  Result Date: 08/14/2016 CLINICAL DATA:  Nausea and vomiting. Abdominal pain after starting Phenergan. EXAM: CT ABDOMEN AND PELVIS WITHOUT CONTRAST TECHNIQUE: Multidetector CT imaging of the abdomen and pelvis was performed following the standard protocol without IV contrast. COMPARISON:  01/08/2016. FINDINGS: Lower chest: Patchy airspace opacity in the right middle lobe and right lower lobe. Mild left lower lobe atelectasis. No pleural fluid. Hepatobiliary: Mildly distended gallbladder containing sludge and a 9 mm gallstone. No gallbladder wall thickening or pericholecystic fluid seen. Unremarkable liver. Pancreas: Unremarkable. No pancreatic ductal dilatation or surrounding inflammatory changes. Spleen: Normal in size without focal abnormality. Adrenals/Urinary Tract: 2 tiny lower pole left renal calculi. Interval minimally dilated ureters and collecting systems. Distended urinary bladder. No urinary tract calculi seen. Stomach/Bowel: Progressive dilatation of the stomach and similar degree of dilatation of the proximal small bowel. Normal caliber distal small bowel with some fecalized bowel contents. No pneumatosis or wall thickening. No well-defined transition point. Inferior displacement of the rectum. Prominent stool throughout normal caliber colon. No evidence of appendicitis. Vascular/Lymphatic: Atheromatous arterial calcifications, including the abdominal aorta and dense coronary artery calcifications. No enlarged lymph nodes. Reproductive: Surgically absent uterus.  No adnexal masses. Other: Small amount of free peritoneal fluid. Mild diffuse subcutaneous edema. Musculoskeletal: Marked right hip degenerative changes with joint space narrowing and subarticular cyst formation. Lumbar and lower thoracic spine degenerative changes. IMPRESSION: 1. Recurrent partial small bowel  obstruction. The cause of obstruction is not identified. Reactive ileus due to right lung pneumonia is also a possibility. 2. Patchy airspace opacity in the right middle lobe and right lower lobe suspicious for pneumonia. 3. Rectocele. 4. 2 tiny, nonobstructing lower pole left renal calculi. 5. Interval minimal bilateral hydronephrosis, possibly mechanical due to mild distention of the urinary bladder. 6. Cholelithiasis and sludge in the gallbladder without evidence of cholecystitis. 7. Densely calcified coronary artery and aortic atherosclerosis. Electronically Signed   By: Claudie Revering M.D.   On: 08/14/2016 18:22    Review of Systems  Constitutional: Positive for malaise/fatigue. Negative for chills.  Respiratory: Negative for cough, shortness of breath and stridor.   Cardiovascular: Negative for chest pain.  Gastrointestinal: Positive for abdominal pain, nausea and vomiting. Negative for constipation and diarrhea.  Genitourinary: Negative for dysuria.  Neurological: Positive for weakness.  All other systems reviewed and are negative.  Blood pressure 129/66, pulse 84, temperature 99.3 F (37.4 C), temperature source Oral, resp. rate 17, SpO2 100 %. Physical Exam  Constitutional: She is oriented to person, place, and time. She appears well-developed and well-nourished. No distress.  Sitting upright in bed with a nasogastric tube in place.  HENT:  Head: Normocephalic and atraumatic.  Right Ear: External ear normal.  Left Ear: External ear normal.  Eyes: Conjunctivae are normal. Pupils are equal, round, and reactive to light. Right eye exhibits no discharge. Left eye exhibits no discharge. No scleral icterus.  Neck: Normal range of motion. No tracheal deviation present.  Cardiovascular: Normal rate, regular rhythm and normal heart sounds.   No murmur heard. Respiratory: Effort normal and breath sounds normal. No respiratory distress. She has no wheezes.  GI: She exhibits distension. There is  no tenderness. There is no guarding.  Abdomen is distended and protuberant but nontender  Musculoskeletal: Normal range of motion.  She exhibits no edema.  Lymphadenopathy:    She has no cervical adenopathy.  Neurological: She is alert and oriented to person, place, and time.  Skin: Skin is warm and dry. She is not diaphoretic. No erythema. No pallor.  Psychiatric: Her behavior is normal. Judgment normal.    Assessment/Plan: Partial bowel obstruction versus ileus  She has pneumonia may have aspirated from the vomiting. She is being admitted to the medical service. We'll continue nothing by mouth with a nasogastric tube in place. Her abdominal x-rays will be repeated in the morning. She is being admitted to the medical service. We may need to do the small bowel protocol depending on her improvement in the next 24 hours. We will follow her closely with you.  Shakhia Gramajo A 08/14/2016, 8:35 PM

## 2016-08-14 NOTE — Progress Notes (Signed)
Pharmacy Antibiotic Note  Miara Emminger is a 83 y.o. female admitted on 08/14/2016 with pneumonia and sepsis.  Pharmacy has been consulted for vancomycin and zosyn dosing.  Serum creatinine elevated above baseline at 2.27 (baseline ~1.4) with CrCL ~10 mL/min.   Plan: Vancomycin 750mg  IV x1, then 500mg  every 48 hours Cefepime 500mg  IV every 24 hours Monitor renal function, clinical progress, VT as indicated    Temp (24hrs), Avg:99.3 F (37.4 C), Min:99.3 F (37.4 C), Max:99.3 F (37.4 C)   Recent Labs Lab 08/14/16 1519 08/14/16 1541  WBC 21.1*  --   CREATININE 2.27*  --   LATICACIDVEN  --  1.12    Estimated Creatinine Clearance: 9.9 mL/min (A) (by C-G formula based on SCr of 2.27 mg/dL (H)).    Allergies  Allergen Reactions  . Amoxicillin Other (See Comments)    Reaction unknown  . Baclofen Other (See Comments)    "fuzzy in the eyes" and dizzy  . Hctz [Hydrochlorothiazide]     nausea  . Valium [Diazepam] Other (See Comments)    Pt became unresponsive and O2 sats dropped.    Demetrius Charity, PharmD Acute Care Pharmacy Resident  Pager: [phone] 08/14/2016

## 2016-08-14 NOTE — ED Triage Notes (Signed)
Per [facility], pt from [facility] facility, c/o n/v, was seen by a PA and treated with phenergan, then pt started c/o belly pain. LBM yeterday. Pt is AAOX4. Vss. Provider at facillity requesting CT scan of abdomen.

## 2016-08-14 NOTE — ED Notes (Addendum)
Emesis x 1.  [facility] EDP aware

## 2016-08-14 NOTE — ED Notes (Signed)
CT called reports CT cannot be done d/t pt's elevated creatinine.  [facility] EDP notified and still wants CT done without contrast.  CT made aware

## 2016-08-14 NOTE — ED Notes (Signed)
Phlebotomy at bedside.

## 2016-08-15 ENCOUNTER — Inpatient Hospital Stay (HOSPITAL_COMMUNITY): Payer: Medicare Other

## 2016-08-15 DIAGNOSIS — L8990 Pressure ulcer of unspecified site, unspecified stage: Secondary | ICD-10-CM | POA: Diagnosis present

## 2016-08-15 LAB — BASIC METABOLIC PANEL
ANION GAP: 12 (ref 5–15)
BUN: 28 mg/dL — ABNORMAL HIGH (ref 6–20)
BUN: 28 mg/dL — AB (ref 4–21)
CHLORIDE: 94 mmol/L — AB (ref 101–111)
CO2: 34 mmol/L — AB (ref 22–32)
CREATININE: 1.79 mg/dL — AB (ref 0.44–1.00)
Calcium: 8 mg/dL — ABNORMAL LOW (ref 8.9–10.3)
Creatinine: 1.8 mg/dL — AB (ref 0.5–1.1)
GFR calc non Af Amer: 23 mL/min — ABNORMAL LOW (ref >60)
GFR, EST AFRICAN AMERICAN: 26 mL/min — AB (ref >60)
GLUCOSE: 86 mg/dL
Glucose, Bld: 86 mg/dL (ref 65–99)
POTASSIUM: 3.3 mmol/L — AB (ref 3.5–5.1)
Potassium: 3.3 mmol/L — AB (ref 3.4–5.3)
SODIUM: 140 mmol/L (ref 135–145)
Sodium: 140 mmol/L (ref 137–147)

## 2016-08-15 LAB — BRAIN NATRIURETIC PEPTIDE: B Natriuretic Peptide: 475.7 pg/mL — ABNORMAL HIGH (ref 0.0–100.0)

## 2016-08-15 LAB — CBC
HCT: 28.2 % — ABNORMAL LOW (ref 36.0–46.0)
HEMOGLOBIN: 8.5 g/dL — AB (ref 12.0–15.0)
MCH: 24.9 pg — AB (ref 26.0–34.0)
MCHC: 30.1 g/dL (ref 30.0–36.0)
MCV: 82.5 fL (ref 78.0–100.0)
PLATELETS: 256 10*3/uL (ref 150–400)
RBC: 3.42 MIL/uL — AB (ref 3.87–5.11)
RDW: 16.2 % — ABNORMAL HIGH (ref 11.5–15.5)
WBC: 16.1 10*3/uL — AB (ref 4.0–10.5)

## 2016-08-15 LAB — CBC AND DIFFERENTIAL
HEMATOCRIT: 28 % — AB (ref 36–46)
HEMOGLOBIN: 8.5 g/dL — AB (ref 12.0–16.0)
Platelets: 256 10*3/uL (ref 150–399)
WBC: 16.1 10*3/mL

## 2016-08-15 LAB — HIV ANTIBODY (ROUTINE TESTING W REFLEX): HIV SCREEN 4TH GENERATION: NONREACTIVE

## 2016-08-15 LAB — STREP PNEUMONIAE URINARY ANTIGEN: STREP PNEUMO URINARY ANTIGEN: NEGATIVE

## 2016-08-15 LAB — GLUCOSE, CAPILLARY: GLUCOSE-CAPILLARY: 79 mg/dL (ref 65–99)

## 2016-08-15 LAB — SODIUM, URINE, RANDOM: SODIUM UR: 44 mmol/L

## 2016-08-15 LAB — MRSA PCR SCREENING: MRSA by PCR: NEGATIVE

## 2016-08-15 MED ORDER — COLLAGENASE 250 UNIT/GM EX OINT
TOPICAL_OINTMENT | Freq: Every day | CUTANEOUS | Status: DC
  Administered 2016-08-15 – 2016-08-17 (×3): via TOPICAL
  Filled 2016-08-15: qty 30

## 2016-08-15 MED ORDER — DIATRIZOATE MEGLUMINE & SODIUM 66-10 % PO SOLN
90.0000 mL | Freq: Once | ORAL | Status: AC
  Administered 2016-08-15: 90 mL via NASOGASTRIC
  Filled 2016-08-15: qty 90

## 2016-08-15 NOTE — Progress Notes (Signed)
Pt. Admitted to room 518 from ED, and she was able to answer admission questions.No family at bedside.Pt alert to person place and date. Pt has NGT to low wall sxn with greenish drainage noted.Skin assessment done by this nurse and Sonya RN reveal an unstageable pressure injury to sacrum with red blanchable surrounding tissue. Pt. turned to keep her off her bottom. Pt verbalizes and demonstrated proper use of call bell for assistance, however, the bed alarm was also utilized for pt. Safety.

## 2016-08-15 NOTE — Progress Notes (Signed)
Progress Note: General Surgery Service   Subjective: No nausea, no pain  Objective: Vital signs in last 24 hours: Temp:  [98.2 F (36.8 C)-99.3 F (37.4 C)] 98.2 F (36.8 C) (03/23 0510) Pulse Rate:  [75-103] 82 (03/23 0510) Resp:  [9-18] 18 (03/23 0510) BP: (112-168)/(53-88) 112/53 (03/23 0510) SpO2:  [84 %-100 %] 97 % (03/23 0510) Weight:  [49.7 kg (109 lb 8 oz)] 49.7 kg (109 lb 8 oz) (03/22 2136)    Intake/Output from previous day: 03/22 0701 - 03/23 0700 In: 2912.5 [I.V.:662.5; IV Piggyback:2250] Out: 1000 [Emesis/NG output:600] Intake/Output this shift: No intake/output data recorded.  Lungs: CTAB  Cardiovascular: RRR  Abd: soft, nontender  Extremities: NAD  Neuro: AOx4  Lab Results: CBC   Recent Labs  08/14/16 1519 08/15/16 0531  WBC 21.1* 16.1*  HGB 9.4* 8.5*  HCT 31.3* 28.2*  PLT 300 256   BMET  Recent Labs  08/14/16 1519 08/15/16 0531  NA 137 140  K 4.3 3.3*  CL 88* 94*  CO2 38* 34*  GLUCOSE 144* 86  BUN 37* 28*  CREATININE 2.27* 1.79*  CALCIUM 9.1 8.0*   PT/INR  Recent Labs  08/14/16 2129  LABPROT 14.6  INR 1.14   ABG No results for input(s): PHART, HCO3 in the last 72 hours.  Invalid input(s): PCO2, PO2  Studies/Results:  Anti-infectives: Anti-infectives    Start     Dose/Rate Route Frequency Ordered Stop   08/16/16 1900  vancomycin (VANCOCIN) 500 mg in sodium chloride 0.9 % 100 mL IVPB     500 mg 100 mL/hr over 60 Minutes Intravenous Every 48 hours 08/14/16 1858     08/14/16 2030  metroNIDAZOLE (FLAGYL) IVPB 500 mg     500 mg 100 mL/hr over 60 Minutes Intravenous Every 8 hours 08/14/16 2001     08/14/16 1900  metroNIDAZOLE (FLAGYL) IVPB 500 mg  Status:  Discontinued     500 mg 100 mL/hr over 60 Minutes Intravenous  Once 08/14/16 1846 08/14/16 2007   08/14/16 1900  vancomycin (VANCOCIN) IVPB 750 mg/150 ml premix     750 mg 150 mL/hr over 60 Minutes Intravenous  Once 08/14/16 1858 08/14/16 2159   08/14/16 1900   ceFEPIme (MAXIPIME) 500 mg in dextrose 5 % 50 mL IVPB     500 mg 100 mL/hr over 30 Minutes Intravenous Every 24 hours 08/14/16 1858        Medications: Scheduled Meds: . ceFEPime (MAXIPIME) IV  500 mg Intravenous Q24H  . diatrizoate meglumine-sodium  90 mL Per NG tube Once  . metronidazole  500 mg Intravenous Q8H  . mirtazapine  15 mg Oral QHS  . morphine  15 mg Oral BID  . pantoprazole (PROTONIX) IV  40 mg Intravenous Q12H  . rOPINIRole  4 mg Oral QHS  . sodium chloride flush  3 mL Intravenous Q12H  . [START ON 08/16/2016] vancomycin  500 mg Intravenous Q48H   Continuous Infusions: . sodium chloride 75 mL/hr at 08/15/16 0505   PRN Meds:.acetaminophen **OR** acetaminophen, albuterol, hydrALAZINE, morphine injection, ondansetron (ZOFRAN) IV, zolpidem  Assessment/Plan: Patient Active Problem List   Diagnosis Date Noted  . Aspiration pneumonia ([place]) 08/14/2016  . Chronic diastolic CHF (congestive heart failure) ([place]) 08/14/2016  . Chronic right shoulder pain 02/09/2016  . Bilateral lower extremity edema 02/02/2016  . Ileitis 01/19/2016  . Pressure ulcer 01/09/2016  . SBO (small bowel obstruction) 01/08/2016  . Narcotic addiction ([place]) 05/15/2015  . Diarrhea 03/30/2015  . Acute pharyngitis 02/26/2015  .  Osteoarthritis of right knee 02/22/2015  . Pain in joint, lower leg 10/31/2014  . Ulcer of sacral region, stage 4 ([place]) 04/15/2014  . Moderate aortic stenosis 04/14/2014  . Pre-syncope 04/14/2014  . Hypotension 04/14/2014  . Acute renal failure superimposed on stage 2 chronic kidney disease ([place]) 04/14/2014  . Anemia 04/14/2014  . Protein-calorie malnutrition, severe ([place]) 04/14/2014  . Altered mental status   . Dehydration 04/13/2014  . Altered mental state 04/13/2014  . Acute kidney injury ([place])   . Hypokalemia 03/25/2014  . Syncope 03/16/2014  . Acute encephalopathy 03/16/2014  . Leg swelling 03/02/2014  . Avascular necrosis of bone of right hip ([place]) 09/27/2013  .  Primary osteoarthritis of right hip 09/07/2013  . Spinal stenosis, lumbar region, with neurogenic claudication 09/07/2013  . Closed fracture of pubic ramus ([place]) 09/07/2013  . BPPV (benign paroxysmal positional vertigo) 09/07/2013  . Pharyngeal swelling 08/30/2013  . Pelvic fracture ([place]) 08/28/2013  . Fall 08/26/2013  . Dizziness 08/26/2013  . Abnormal stress test 08/26/2013  . Preop cardiovascular exam 08/01/2013  . Aortic stenosis 08/01/2013  . Edema 06/30/2013  . Loss of weight 01/04/2013  . Situational stress 01/04/2013  . Chronic right hip pain 07/05/2012  . Knee pain, acute 06/29/2012  . Vertigo 06/24/2012  . Acute exacerbation of chronic low back pain 06/24/2012  . Lumbar pain with radiation down right leg 06/09/2012  . Osteoarthritis of hip 04/29/2012  . History of TIAs 04/17/2012  . S/P total hysterectomy and bilateral salpingo-oophorectomy 04/17/2012  . Hard of hearing 04/17/2012  . History of anemia 04/17/2012  . RLS (restless legs syndrome) 04/17/2012  . Chronic back pain 04/17/2012  . Spinal stenosis, lumbar 04/17/2012  . Lower back pain 12/20/2011  . Personal history of colonic polyps 10/08/2011  . H/O 09/14/2011  . Essential hypertension 09/14/2011  . Restless legs syndrome 09/14/2011  . Constipation 08/20/2011  . Urinary incontinence 08/20/2011   SBO -NG tube and protocol -enema    LOS: 1 day   Mickeal Skinner, MD Pg# [phone] [facility]

## 2016-08-15 NOTE — Consult Note (Signed)
[facility] Nurse wound consult note Reason for Consult: Consult requested for buttock wound.  Pt states it has been there "along time and no one has done anything about it.  It started as a lump." Pt is very thin. Wound type: Unstageable pressure injury to right upper buttock Pressure Injury POA: Yes Measurement: 4X3cm Wound bed: 90% tightly adhered eschar, 10% red, no fluctuance. Drainage (amount, consistency, odor) No odor or drainage Periwound: Intact skin surrounding Dressing procedure/placement/frequency: Santyl ointment to provide enzymatic debridement of nonviable tissue.  Discussed plan of care with patient and she verbalized understanding. Please re-consult if further assistance is needed.  Thank-you,  Julien Girt MSN, [place], [place], [place], [place]

## 2016-08-15 NOTE — Progress Notes (Addendum)
Patient ID: Shelly Martin, female   DOB: March 17, 1919, 83 y.o.   MRN: [id number]    PROGRESS NOTE  Jammy Stlouis  OIN:[id number] DOB: 1918-10-28 DOA: 08/14/2016  PCP: Inocencio Homes, MD   Brief Narrative:  83 y.o. female with medical history significant of peptic ulcer disease with perforation, colon resection, hypertension, GERD, depression, vertigo, urinary incontinence, TIA, stroke, restless leg syndrome, aortic stenosis anemia, chronic kidney disease-stage II, dCHF, who presents with nausea, vomiting, abdominal pain.  Assessment & Plan:   SBO (small bowel obstruction) - CT scan showed recurrent partial small bowel obstruction.  - Likely due to adhesion secondary to previous abdominal surgery - NGT in place, pt reports feeling better but does not like NGT - XRAY notable for interval improvement - surgery team following - appreciate recommendations   Possible  Aspiration pneumonia - CT abdomen/pelvis also showed infiltration in RML and RLL, which is likely due to aspiration. - IV Vancomycin, cefepime, flagyl were started in ED, will continue for now day #2 until we get prelim sputum and blood cultures back - plan to narrow down in next 24 hours as pt still with some dyspnea and leukocytosis  - prn Albuterol Nebs for SOB - Urine legionella and S. pneumococcal antigen also pending   HTN, essential  - hold oral amlodipine and demadex due to NG tube placement and SBP is stable in 110's this AM  - IV hydralazine when necessary  GERD - continue Protonix IV  Hypokalemia - supplement and repeat BMP in AM  Hx of TIA - not on medications - no acute issues - observe  Narcotic use - On MS contin - prn morphine  Acute renal failure superimposed on stage 2 chronic kidney disease - Baseline creatinine 1.2-1.4. Her creatinine 2.27, BUN 37 - Likely due to prerenal secondary to dehydration and continuation of diuretics. - responding well to IVF, Cr is trending down - BMP  in AM  Anemia, ? Iron deficiency  - Hemoglobin 9.4, which is her baseline. Her hemoglobin was 8.5 on 04/26/16 - Follow-up by CBC  Depression - Stable, no suicidal or homicidal ideations. - Continue home medications: Mirtazapine, Requip  Chronic diastolic congestive heart failure - 2-D echo on 03/17/14 showed EF of 70% with diastolic dysfunction.  - Patient does not have leg edema or JVD. CHF is compensated. - Hold Demadex due to worsening renal function  Pressure ulcer: - buttock wound, 4x3 cm - Pt states it has been there "along time and no one has done anything about it.  It started as a lump." - Unstageable pressure injury to right upper buttock - Dressing procedure/placement/frequency: Santyl ointment to provide enzymatic debridement of nonviable tissue - appreciate WOC team assistance   DVT prophylaxis: SCD's Code Status: Full  Family Communication: Patient at bedside, daughter at bedside  Disposition Plan: to be determined, once surgery team clears   Consultants:   Surgery  WOC  Procedures:   None  Antimicrobials:   Vancomycin 3/22 -->  Cefepime 3/22 -->  Flagyl 3/22 -->  Subjective: Reports feeling better.   Objective: Vitals:   08/14/16 2057 08/14/16 2102 08/14/16 2136 08/15/16 0510  BP:   (!) 147/69 (!) 112/53  Pulse:   91 82  Resp:   18 18  Temp:  99.2 F (37.3 C) 98.3 F (36.8 C) 98.2 F (36.8 C)  TempSrc:  Oral Oral Oral  SpO2: 96%  93% 97%  Weight:   49.7 kg (109 lb 8 oz)     Intake/Output Summary (  Last 24 hours) at 08/15/16 1345 Last data filed at 08/15/16 1038  Gross per 24 hour  Intake           2912.5 ml  Output             1200 ml  Net           1712.5 ml   Filed Weights   08/14/16 2136  Weight: 49.7 kg (109 lb 8 oz)    Examination:  General exam: Appears calm and comfortable  Respiratory system: Respiratory effort normal. Cardiovascular system: S1 & S2 heard, RRR. No rubs, gallops or clicks. No pedal  edema. Gastrointestinal system: Abdomen is nondistended, nontender. No organomegaly or masses felt. NGT in place.  Central nervous system: Alert and oriented. No focal neurological deficits. Extremities: Symmetric 5 x 5 power.  Data Reviewed: I have personally reviewed following labs and imaging studies  CBC:  Recent Labs Lab 08/14/16 1519 08/15/16 0531  WBC 21.1* 16.1*  NEUTROABS 19.6*  --   HGB 9.4* 8.5*  HCT 31.3* 28.2*  MCV 81.7 82.5  PLT 300 407   Basic Metabolic Panel:  Recent Labs Lab 08/14/16 1519 08/15/16 0531  NA 137 140  K 4.3 3.3*  CL 88* 94*  CO2 38* 34*  GLUCOSE 144* 86  BUN 37* 28*  CREATININE 2.27* 1.79*  CALCIUM 9.1 8.0*   Liver Function Tests:  Recent Labs Lab 08/14/16 1519  AST 28  ALT 17  ALKPHOS 107  BILITOT 1.1  PROT 6.6  ALBUMIN 3.4*   Coagulation Profile:  Recent Labs Lab 08/14/16 2129  INR 1.14   CBG:  Recent Labs Lab 08/15/16 0818  GLUCAP 79   Urine analysis:    Component Value Date/Time   COLORURINE YELLOW 08/14/2016 1750   APPEARANCEUR CLEAR 08/14/2016 1750   LABSPEC 1.009 08/14/2016 1750   PHURINE 7.0 08/14/2016 1750   GLUCOSEU NEGATIVE 08/14/2016 1750   HGBUR NEGATIVE 08/14/2016 1750   BILIRUBINUR NEGATIVE 08/14/2016 1750   BILIRUBINUR neg 04/15/2012 1702   KETONESUR NEGATIVE 08/14/2016 1750   PROTEINUR NEGATIVE 08/14/2016 1750   UROBILINOGEN 0.2 05/25/2014 0327   NITRITE NEGATIVE 08/14/2016 1750   LEUKOCYTESUR TRACE (A) 08/14/2016 1750   Recent Results (from the past 240 hour(s))  Blood culture (routine x 2)     Status: None (Preliminary result)   Collection Time: 08/14/16  7:55 PM  Result Value Ref Range Status   Specimen Description BLOOD RIGHT ANTECUBITAL  Final   Special Requests BOTTLES DRAWN AEROBIC AND ANAEROBIC 5CC  Final   Culture NO GROWTH < 24 HOURS  Final   Report Status PENDING  Incomplete  Blood culture (routine x 2)     Status: None (Preliminary result)   Collection Time: 08/14/16   8:00 PM  Result Value Ref Range Status   Specimen Description BLOOD RIGHT HAND  Final   Special Requests IN PEDIATRIC BOTTLE 1CC  Final   Culture NO GROWTH < 24 HOURS  Final   Report Status PENDING  Incomplete  MRSA PCR Screening     Status: None   Collection Time: 08/14/16 11:25 PM  Result Value Ref Range Status   MRSA by PCR NEGATIVE NEGATIVE Final    Comment:        The GeneXpert MRSA Assay (FDA approved for NASAL specimens only), is one component of a comprehensive MRSA colonization surveillance program. It is not intended to diagnose MRSA infection nor to guide or monitor treatment for MRSA infections.  Radiology Studies: Ct Abdomen Pelvis Wo Contrast  Result Date: 08/14/2016 CLINICAL DATA:  Nausea and vomiting. Abdominal pain after starting Phenergan. EXAM: CT ABDOMEN AND PELVIS WITHOUT CONTRAST TECHNIQUE: Multidetector CT imaging of the abdomen and pelvis was performed following the standard protocol without IV contrast. COMPARISON:  01/08/2016. FINDINGS: Lower chest: Patchy airspace opacity in the right middle lobe and right lower lobe. Mild left lower lobe atelectasis. No pleural fluid. Hepatobiliary: Mildly distended gallbladder containing sludge and a 9 mm gallstone. No gallbladder wall thickening or pericholecystic fluid seen. Unremarkable liver. Pancreas: Unremarkable. No pancreatic ductal dilatation or surrounding inflammatory changes. Spleen: Normal in size without focal abnormality. Adrenals/Urinary Tract: 2 tiny lower pole left renal calculi. Interval minimally dilated ureters and collecting systems. Distended urinary bladder. No urinary tract calculi seen. Stomach/Bowel: Progressive dilatation of the stomach and similar degree of dilatation of the proximal small bowel. Normal caliber distal small bowel with some fecalized bowel contents. No pneumatosis or wall thickening. No well-defined transition point. Inferior displacement of the rectum. Prominent stool  throughout normal caliber colon. No evidence of appendicitis. Vascular/Lymphatic: Atheromatous arterial calcifications, including the abdominal aorta and dense coronary artery calcifications. No enlarged lymph nodes. Reproductive: Surgically absent uterus.  No adnexal masses. Other: Small amount of free peritoneal fluid. Mild diffuse subcutaneous edema. Musculoskeletal: Marked right hip degenerative changes with joint space narrowing and subarticular cyst formation. Lumbar and lower thoracic spine degenerative changes. IMPRESSION: 1. Recurrent partial small bowel obstruction. The cause of obstruction is not identified. Reactive ileus due to right lung pneumonia is also a possibility. 2. Patchy airspace opacity in the right middle lobe and right lower lobe suspicious for pneumonia. 3. Rectocele. 4. 2 tiny, nonobstructing lower pole left renal calculi. 5. Interval minimal bilateral hydronephrosis, possibly mechanical due to mild distention of the urinary bladder. 6. Cholelithiasis and sludge in the gallbladder without evidence of cholecystitis. 7. Densely calcified coronary artery and aortic atherosclerosis. Electronically Signed   By: Claudie Revering M.D.   On: 08/14/2016 18:22   Dg Abd 2 Views  Result Date: 08/15/2016 CLINICAL DATA:  Check nasogastric catheter placement EXAM: ABDOMEN - 2 VIEW COMPARISON:  08/14/2016 FINDINGS: Nasogastric catheter is noted coiled within the fundus of the stomach stable from the prior exam. Scattered large and small bowel gas is again seen. The degree of small bowel dilatation has improved somewhat in the interval from the prior exam. Fecal material is noted throughout the colon. Degenerative changes of the lumbar spine and hip joints are noted. IMPRESSION: Nasogastric catheter within the stomach. The degree of small bowel dilatation has improved in the interval. Electronically Signed   By: Inez Catalina M.D.   On: 08/15/2016 09:10   Dg Abd Portable 1v  Result Date:  08/14/2016 CLINICAL DATA:  NG tube placement EXAM: PORTABLE ABDOMEN - 1 VIEW COMPARISON:  08/14/2016 FINDINGS: Esophageal tube tip is folded upon itself in the gastric fundus. Multiple dilated loops of small bowel up to 5.7 cm, consistent with small bowel obstruction. IMPRESSION: Esophageal tube tip overlies the proximal stomach. Electronically Signed   By: Donavan Foil M.D.   On: 08/14/2016 20:49      Scheduled Meds: . ceFEPime (MAXIPIME) IV  500 mg Intravenous Q24H  . collagenase   Topical Daily  . diatrizoate meglumine-sodium  90 mL Per NG tube Once  . metronidazole  500 mg Intravenous Q8H  . mirtazapine  15 mg Oral QHS  . morphine  15 mg Oral BID  . pantoprazole (PROTONIX) IV  40 mg  Intravenous Q12H  . rOPINIRole  4 mg Oral QHS  . sodium chloride flush  3 mL Intravenous Q12H  . [START ON 08/16/2016] vancomycin  500 mg Intravenous Q48H   Continuous Infusions: . sodium chloride 75 mL/hr at 08/15/16 0505     LOS: 1 day    Time spent: 20 minutes    Faye Ramsay, MD [facility] Pager [phone]  If 7PM-7AM, please contact night-coverage [email or web address] Password [email or web address] 08/15/2016, 1:45 PM

## 2016-08-15 NOTE — NC FL2 (Signed)
[place] MEDICAID FL2 LEVEL OF CARE SCREENING TOOL     IDENTIFICATION  Patient Name: Shelly Martin Birthdate: 02/21/1919 Sex: female Admission Date (Current Location): 08/14/2016  [facility] and [place] Number:  Herbalist and Address:  [facility], [place] [address] [place], [place] [address]      Provider Number: [id number]  Attending Physician Name and Address:  Theodis Blaze, MD  Relative Name and Phone Number:       Current Level of Care: Hospital Recommended Level of Care: [place] Prior Approval Number:    Date Approved/Denied:   PASRR Number:    Discharge Plan: SNF    Current Diagnoses: Patient Active Problem List   Diagnosis Date Noted  . Pressure injury of skin 08/15/2016  . Aspiration pneumonia ([place]) 08/14/2016  . Chronic diastolic CHF (congestive heart failure) ([place]) 08/14/2016  . Chronic right shoulder pain 02/09/2016  . Bilateral lower extremity edema 02/02/2016  . Ileitis 01/19/2016  . Pressure ulcer 01/09/2016  . SBO (small bowel obstruction) 01/08/2016  . Narcotic addiction ([place]) 05/15/2015  . Diarrhea 03/30/2015  . Acute pharyngitis 02/26/2015  . Osteoarthritis of right knee 02/22/2015  . Pain in joint, lower leg 10/31/2014  . Ulcer of sacral region, stage 4 ([place]) 04/15/2014  . Moderate aortic stenosis 04/14/2014  . Pre-syncope 04/14/2014  . Hypotension 04/14/2014  . Acute renal failure superimposed on stage 2 chronic kidney disease ([place]) 04/14/2014  . Anemia 04/14/2014  . Protein-calorie malnutrition, severe ([place]) 04/14/2014  . Altered mental status   . Dehydration 04/13/2014  . Altered mental state 04/13/2014  . Acute kidney injury ([place])   . Hypokalemia 03/25/2014  . Syncope 03/16/2014  . Acute encephalopathy 03/16/2014  . Leg swelling 03/02/2014  . Avascular necrosis of bone of right hip ([place]) 09/27/2013  . Primary osteoarthritis of right hip 09/07/2013  . Spinal stenosis, lumbar  region, with neurogenic claudication 09/07/2013  . Closed fracture of pubic ramus ([place]) 09/07/2013  . BPPV (benign paroxysmal positional vertigo) 09/07/2013  . Pharyngeal swelling 08/30/2013  . Pelvic fracture ([place]) 08/28/2013  . Fall 08/26/2013  . Dizziness 08/26/2013  . Abnormal stress test 08/26/2013  . Preop cardiovascular exam 08/01/2013  . Aortic stenosis 08/01/2013  . Edema 06/30/2013  . Loss of weight 01/04/2013  . Situational stress 01/04/2013  . Chronic right hip pain 07/05/2012  . Knee pain, acute 06/29/2012  . Vertigo 06/24/2012  . Acute exacerbation of chronic low back pain 06/24/2012  . Lumbar pain with radiation down right leg 06/09/2012  . Osteoarthritis of hip 04/29/2012  . History of TIAs 04/17/2012  . S/P total hysterectomy and bilateral salpingo-oophorectomy 04/17/2012  . Hard of hearing 04/17/2012  . History of anemia 04/17/2012  . RLS (restless legs syndrome) 04/17/2012  . Chronic back pain 04/17/2012  . Spinal stenosis, lumbar 04/17/2012  . Lower back pain 12/20/2011  . Personal history of colonic polyps 10/08/2011  . H/O 09/14/2011  . Essential hypertension 09/14/2011  . Restless legs syndrome 09/14/2011  . Constipation 08/20/2011  . Urinary incontinence 08/20/2011    Orientation RESPIRATION BLADDER Height & Weight     Self  O2 (2L ) Incontinent Weight: 109 lb 8 oz (49.7 kg) Height:     BEHAVIORAL SYMPTOMS/MOOD NEUROLOGICAL BOWEL NUTRITION STATUS      Continent Diet (see DC summary)  AMBULATORY STATUS COMMUNICATION OF NEEDS Skin   Extensive Assist Verbally Normal  Personal Care Assistance Level of Assistance  Bathing, Dressing Bathing Assistance: Maximum assistance   Dressing Assistance: Maximum assistance     Functional Limitations Info             SPECIAL CARE FACTORS FREQUENCY  PT (By licensed PT), OT (By licensed OT)     PT Frequency: 5/wk OT Frequency: 5/wk            Contractures       Additional Factors Info  Code Status, Allergies Code Status Info: FULL Allergies Info: Amoxicillin, Baclofen, Hctz Hydrochlorothiazide, Valium Diazepam           Current Medications (08/15/2016):  This is the current hospital active medication list Current Facility-Administered Medications  Medication Dose Route Frequency Provider Last Rate Last Dose  . 0.9 %  sodium chloride infusion   Intravenous Continuous Ivor Costa, MD 75 mL/hr at 08/15/16 0505    . acetaminophen (TYLENOL) tablet 650 mg  650 mg Oral Q6H PRN Ivor Costa, MD       Or  . acetaminophen (TYLENOL) suppository 650 mg  650 mg Rectal Q6H PRN Ivor Costa, MD      . albuterol (PROVENTIL) (2.5 MG/3ML) 0.083% nebulizer solution 2.5 mg  2.5 mg Nebulization Q4H PRN Ivor Costa, MD      . ceFEPIme (MAXIPIME) 500 mg in dextrose 5 % 50 mL IVPB  500 mg Intravenous Q24H Delane Ginger, [facility]   Stopped at 08/14/16 2057  . collagenase (SANTYL) ointment   Topical Daily Theodis Blaze, MD      . diatrizoate meglumine-sodium (GASTROGRAFIN) 66-10 % solution 90 mL  90 mL Per NG tube Once Arta Bruce Kinsinger, MD      . hydrALAZINE (APRESOLINE) injection 5 mg  5 mg Intravenous Q2H PRN Ivor Costa, MD      . metroNIDAZOLE (FLAGYL) IVPB 500 mg  500 mg Intravenous Q8H Ivor Costa, MD   500 mg at 08/15/16 1417  . mirtazapine (REMERON) tablet 15 mg  15 mg Oral QHS Ivor Costa, MD      . morphine (MS CONTIN) 12 hr tablet 15 mg  15 mg Oral BID Ivor Costa, MD      . morphine 4 MG/ML injection 2 mg  2 mg Intravenous Q4H PRN Ivor Costa, MD      . ondansetron [facility]) injection 4 mg  4 mg Intravenous Q8H PRN Ivor Costa, MD      . pantoprazole (PROTONIX) injection 40 mg  40 mg Intravenous Q12H Ivor Costa, MD   40 mg at 08/14/16 2243  . rOPINIRole (REQUIP) tablet 4 mg  4 mg Oral QHS Ivor Costa, MD   4 mg at 08/14/16 2244  . sodium chloride flush (NS) 0.9 % injection 3 mL  3 mL Intravenous Q12H Ivor Costa, MD      . Derrill Memo ON 08/16/2016] vancomycin (VANCOCIN) 500 mg in  sodium chloride 0.9 % 100 mL IVPB  500 mg Intravenous Q48H Delane Ginger, RPH      . zolpidem (AMBIEN) tablet 5 mg  5 mg Oral QHS PRN Ivor Costa, MD         Discharge Medications: Please see discharge summary for a list of discharge medications.  Relevant Imaging Results:  Relevant Lab Results:   Additional Information [phone]  Jorge Ny, [place]

## 2016-08-15 NOTE — Progress Notes (Signed)
PT Cancellation Note  Patient Details Name: Senaya Dicenso MRN: [id number] DOB: 1918/11/29   Cancelled Treatment:    Reason Eval/Treat Not Completed: Medical issues which prohibited therapy (pt states she just had an enema and wants to wait until its finished working before she gets OOB. Will follow. )   Philomena Doheny 08/15/2016, 2:57 PM  [phone]

## 2016-08-16 ENCOUNTER — Inpatient Hospital Stay (HOSPITAL_COMMUNITY): Payer: Medicare Other

## 2016-08-16 DIAGNOSIS — E876 Hypokalemia: Secondary | ICD-10-CM

## 2016-08-16 LAB — MAGNESIUM: MAGNESIUM: 2.2 mg/dL (ref 1.7–2.4)

## 2016-08-16 LAB — CBC
HEMATOCRIT: 27.8 % — AB (ref 36.0–46.0)
HEMOGLOBIN: 8.3 g/dL — AB (ref 12.0–15.0)
MCH: 24.9 pg — ABNORMAL LOW (ref 26.0–34.0)
MCHC: 29.9 g/dL — ABNORMAL LOW (ref 30.0–36.0)
MCV: 83.5 fL (ref 78.0–100.0)
Platelets: 261 10*3/uL (ref 150–400)
RBC: 3.33 MIL/uL — AB (ref 3.87–5.11)
RDW: 16 % — ABNORMAL HIGH (ref 11.5–15.5)
WBC: 11.4 10*3/uL — AB (ref 4.0–10.5)

## 2016-08-16 LAB — BASIC METABOLIC PANEL
ANION GAP: 12 (ref 5–15)
BUN: 23 mg/dL — ABNORMAL HIGH (ref 6–20)
BUN: 23 mg/dL — AB (ref 4–21)
CALCIUM: 8 mg/dL — AB (ref 8.9–10.3)
CO2: 31 mmol/L (ref 22–32)
CREATININE: 1.2 mg/dL — AB (ref 0.5–1.1)
CREATININE: 1.2 mg/dL — AB (ref 0.44–1.00)
Chloride: 104 mmol/L (ref 101–111)
GFR, EST AFRICAN AMERICAN: 43 mL/min — AB (ref >60)
GFR, EST NON AFRICAN AMERICAN: 37 mL/min — AB (ref >60)
Glucose, Bld: 69 mg/dL (ref 65–99)
Glucose: 69 mg/dL
Potassium: 3.1 mmol/L — AB (ref 3.4–5.3)
Potassium: 3.1 mmol/L — ABNORMAL LOW (ref 3.5–5.1)
SODIUM: 147 mmol/L (ref 137–147)
Sodium: 147 mmol/L — ABNORMAL HIGH (ref 135–145)

## 2016-08-16 LAB — URINE CULTURE

## 2016-08-16 LAB — GLUCOSE, CAPILLARY
GLUCOSE-CAPILLARY: 119 mg/dL — AB (ref 65–99)
Glucose-Capillary: 61 mg/dL — ABNORMAL LOW (ref 65–99)

## 2016-08-16 LAB — CBC AND DIFFERENTIAL: WBC: 11.4 10^3/mL

## 2016-08-16 MED ORDER — POTASSIUM CHLORIDE CRYS ER 20 MEQ PO TBCR
40.0000 meq | EXTENDED_RELEASE_TABLET | Freq: Once | ORAL | Status: AC
  Administered 2016-08-16: 40 meq via ORAL
  Filled 2016-08-16: qty 2

## 2016-08-16 MED ORDER — SODIUM CHLORIDE 0.9 % IV SOLN
30.0000 meq | Freq: Once | INTRAVENOUS | Status: AC
  Administered 2016-08-16: 30 meq via INTRAVENOUS
  Filled 2016-08-16: qty 15

## 2016-08-16 MED ORDER — PANTOPRAZOLE SODIUM 40 MG PO TBEC
40.0000 mg | DELAYED_RELEASE_TABLET | Freq: Two times a day (BID) | ORAL | Status: DC
  Administered 2016-08-16 – 2016-08-17 (×2): 40 mg via ORAL
  Filled 2016-08-16 (×2): qty 1

## 2016-08-16 MED ORDER — BOOST / RESOURCE BREEZE PO LIQD
1.0000 | Freq: Two times a day (BID) | ORAL | Status: DC
  Administered 2016-08-16 – 2016-08-17 (×3): 1 via ORAL

## 2016-08-16 MED ORDER — DEXTROSE 50 % IV SOLN
INTRAVENOUS | Status: AC
  Administered 2016-08-16: 25 mL
  Filled 2016-08-16: qty 50

## 2016-08-16 MED ORDER — POTASSIUM CHLORIDE 2 MEQ/ML IV SOLN
INTRAVENOUS | Status: DC
  Administered 2016-08-16 – 2016-08-17 (×3): via INTRAVENOUS
  Filled 2016-08-16 (×6): qty 1000

## 2016-08-16 MED ORDER — MAGNESIUM HYDROXIDE 400 MG/5ML PO SUSP: 30.0000 mL | Freq: Every day | ORAL | Status: DC | PRN

## 2016-08-16 NOTE — Progress Notes (Addendum)
CRITICAL VALUE ALERT  Critical value received:  CBG 61  Date of notification:  08/16/16  Time of notification:  0820  Critical value read back:Yes.    Nurse who received alert:  Shanetra Blumenstock   1/2 amp of D 50% given. When rechecked CBG 119.

## 2016-08-16 NOTE — Evaluation (Signed)
Physical Therapy Evaluation Patient Details Name: Shelly Martin MRN: [id number] DOB: [birth year] Today's Date: 08/16/2016   History of Present Illness  83 yo female with poor hearing and a resident of SNF was admitted with SBO, cleared up.  Noted aspiration a concern, has small non obstructive kidney stone.  PMHx:  PVD, CHF, vertigo, TIA, stroke, CKD, rectocele.  Clinical Impression  Pt is seen with SLP present for a short time to assess her tolerance for food and drink.  Her plan is to continue therapy in hospital and transition to SNF which has been her LTC for some time.  Pt will be followed for strengthening, balance in sitting and standing and to restart gait as pt is able.    Follow Up Recommendations SNF    Equipment Recommendations  None recommended by PT    Recommendations for Other Services       Precautions / Restrictions Precautions Precautions: Fall (telemetry) Precaution Comments: Pt reports dizziness with no nystagmus noted Restrictions Weight Bearing Restrictions: No      Mobility  Bed Mobility Overal bed mobility: Needs Assistance Bed Mobility: Supine to Sit;Sit to Supine     Supine to sit: Min assist;Mod assist Sit to supine: Max assist   General bed mobility comments: using bed pad to pivot to sit up on side of bed esp to completely slide forward  Transfers Overall transfer level: Needs assistance Equipment used: Rolling walker (2 wheeled);1 person hand held assist Transfers: Sit to/from Stand Sit to Stand: Min assist         General transfer comment: pt can stand up with PT but is too dizzy to move from there, no nystagmus  Ambulation/Gait             General Gait Details: attempted but too dizzy  Stairs            Wheelchair Mobility    Modified Rankin (Stroke Patients Only)       Balance Overall balance assessment: Needs assistance Sitting-balance support: Feet supported Sitting balance-Leahy Scale: Fair      Standing balance support: Bilateral upper extremity supported Standing balance-Leahy Scale: Poor                               Pertinent Vitals/Pain Pain Assessment: Faces Faces Pain Scale: Hurts even more Pain Location: R hip due to OA with ROM Pain Descriptors / Indicators: Sore Pain Intervention(s): Limited activity within patient's tolerance;Monitored during session;Repositioned    Home Living Family/patient expects to be discharged to:: Skilled nursing facility                      Prior Function Level of Independence: Needs assistance   Gait / Transfers Assistance Needed: using rollator just in her room  ADL's / Homemaking Assistance Needed: has staff to help dress, bathe and handle meals and meds        Hand Dominance   Dominant Hand: Right    Extremity/Trunk Assessment   Upper Extremity Assessment Upper Extremity Assessment: Overall WFL for tasks assessed    Lower Extremity Assessment Lower Extremity Assessment: Generalized weakness    Cervical / Trunk Assessment Cervical / Trunk Assessment: Kyphotic  Communication   Communication: HOH (lip reading)  Cognition Arousal/Alertness: Awake/alert Behavior During Therapy: WFL for tasks assessed/performed Overall Cognitive Status: Within Functional Limits for tasks assessed  General Comments General comments (skin integrity, edema, etc.): Pt was up for limited distances on Rollator prior to this admission but now is too dizzy and weak to walk.  May need to transition to skilled therapy at SNF, as she is on LTC prior to this    Exercises     Assessment/Plan    PT Assessment Patient needs continued PT services  PT Problem List Decreased range of motion;Decreased strength;Decreased activity tolerance;Decreased balance;Decreased mobility;Decreased coordination;Decreased knowledge of use of DME;Decreased safety awareness;Cardiopulmonary  status limiting activity;Decreased skin integrity;Pain       PT Treatment Interventions DME instruction;Gait training    PT Goals (Current goals can be found in the Care Plan section)  Acute Rehab PT Goals Patient Stated Goal: to feel better and to eat more now PT Goal Formulation: With patient Time For Goal Achievement: 08/30/16 Potential to Achieve Goals: Good    Frequency Min 3X/week   Barriers to discharge  (lives in ideal situation for follow up care)      Co-evaluation PT/OT/SLP Co-Evaluation/Treatment: Yes Reason for Co-Treatment: Necessary to address cognition/behavior during functional activity (pt did not want to move until she could eat, which SLP assis) PT goals addressed during session: Mobility/safety with mobility;Balance   SLP goals addressed during session: Swallowing;Other (comment) (upright posture with same, assisted by PT)     End of Session Equipment Utilized During Treatment: Gait belt Activity Tolerance: Treatment limited secondary to medical complications (Comment) (dizzy and weak) Patient left: in bed;with call bell/phone within reach;with bed alarm set Nurse Communication: Mobility status;Other (comment) (dizziness) PT Visit Diagnosis: Muscle weakness (generalized) (M62.81);Other abnormalities of gait and mobility (R26.89)    Time: 6160-7371 PT Time Calculation (min) (ACUTE ONLY): 31 min   Charges:   PT Evaluation $PT Eval Moderate Complexity: 1 Procedure PT Treatments $Therapeutic Activity: 8-22 mins   PT G Codes:   PT G-Codes **NOT FOR INPATIENT CLASS** Functional Assessment Tool Used: AM-PAC 6 Clicks Basic Mobility     Ramond Dial 08/16/2016, 1:51 PM   Mee Hives, PT MS Acute Rehab Dept. Number: [place] and [place]

## 2016-08-16 NOTE — Clinical Social Work Note (Signed)
Clinical Social Work Assessment  Patient Details  Name: Shelly Martin MRN: [id number] Date of Birth: 1919/01/15  Date of referral:  08/16/16               Reason for consult:  Discharge Planning                Permission sought to share information with:  Facility Sport and exercise psychologist, Family Supports Permission granted to share information::  No  Name::     Actor::  [place]  Relationship::  Son  Contact Information:  [phone]  Housing/Transportation Living arrangements for the past 2 months:  [place] of Information:  Adult Children Patient Interpreter Needed:  None Criminal Activity/Legal Involvement Pertinent to Current Situation/Hospitalization:  No - Comment as needed Significant Relationships:  Adult Children Lives with:  Facility Resident Do you feel safe going back to the place where you live?  Yes Need for family participation in patient care:  Yes (Comment)  Care giving concerns:  CSW received consult regarding discharge planning. Patient stated she is going back to her SNF. CSW spoke with patient's son, Fritz Pickerel, and he stated that patient resides at [facility] and will return there at discharge. CSW left voicemail for daughter her in [place]. CSW to continue to follow and assist with discharge planning needs.   Social Worker assessment / plan:  CSW spoke with patient's son concerning returning to SNF.  Employment status:  Retired Forensic scientist:  Medicare PT Recommendations:  [place] / Referral to community resources:  [place]  Patient/Family's Response to care:  Patient's son reports agreement with discharge plan and would like a PTAR transport.   Patient/Family's Understanding of and Emotional Response to Diagnosis, Current Treatment, and Prognosis:  Patient is aware of her medical condition and family is happy with her care. Patient expressed understanding of CSW role  and discharge process. No questions/concerns about plan or treatment.    Emotional Assessment Appearance:  Appears stated age Attitude/Demeanor/Rapport:  Unable to Assess Affect (typically observed):  Unable to Assess Orientation:  Oriented to Self, Oriented to Place Alcohol / Substance use:  Not Applicable Psych involvement (Current and /or in the community):  No (Comment)  Discharge Needs  Concerns to be addressed:  Care Coordination Readmission within the last 30 days:  No Current discharge risk:  None Barriers to Discharge:  Continued Medical Work up   [employer], Latanya Presser 08/16/2016, 3:37 PM

## 2016-08-16 NOTE — Progress Notes (Signed)
Pt received two thirds of potassium chloride 30 mEq in sodium chloride 0.9 % 265 mL (KCL MULTIRUN) IVPB when bag was noted to be leaking. IV infusion stopped, pharmacy called and MD paged. Waiting on orders.

## 2016-08-16 NOTE — Evaluation (Signed)
Clinical/Bedside Swallow Evaluation Patient Details  Name: Shelly Martin MRN: [id number] Date of Birth: [birth year]  Today's Date: 08/16/2016 Time: SLP Start Time (ACUTE ONLY): 29 SLP Stop Time (ACUTE ONLY): 1142 SLP Time Calculation (min) (ACUTE ONLY): 12 min  Past Medical History:  Past Medical History:  Diagnosis Date  . Acute encephalopathy   . AKI (acute kidney injury) ([place])   . Altered mental state   . Anemia   . Aortic stenosis   . Arthritis    Back, knees  . Bowel incontinence   . Colon polyps   . Constipation   . Gastric ulcer    years ago  . Hypertension   . Hypokalemia   . Restless legs syndrome    on Requip  . Situational stress   . Spinal stenosis   . Stroke ([place])    Mini, no residual  . TIA (transient ischemic attack)   . Ulcer causing bleeding and hole in wall of stomach or small intestine   . Urinary bladder calculus   . Urinary incontinence   . Vertigo    Past Surgical History:  Past Surgical History:  Procedure Laterality Date  . COLON RESECTION  50 years ago  . LUMBAR LAMINECTOMY/DECOMPRESSION MICRODISCECTOMY  06/25/2011   Procedure: LUMBAR LAMINECTOMY/DECOMPRESSION MICRODISCECTOMY;  Surgeon: Hosie Spangle, MD;  Location: [facility];  Service: Neurosurgery;  Laterality: Right;  RIGHT Lumbar Two-Three hemilaminectomy and microdiskectomy  . TONSILLECTOMY    . UTERINE FIBROID SURGERY    . VAGINAL HYSTERECTOMY     HPI:  Shelly Martin a 83 y.o.female, admitted 08/14/16, CT scan showed a dilated stomach and proximal small bowel with collapsed distal small bowel. HPI: vertigo, gastric ulcer, TIA, CVA, HTN, constipation, athritis, AKI, acute encephalopathy, partial colectomy. Her CT scan also showed findings consistent with pneumonia. Shelly Martin with NG tube which she removed 08/16/16; SBO resolved per MD notes who recommends advancing diet as tolerated. Referred for swallowing evaluation, currently on clear liquids. Bedside swallowing evaluation 03/17/14 with  findings of functional oropharyngeal swallow, reg /thin liquids recommended with no SLP f/u.    Assessment / Plan / Recommendation Clinical Impression  Patient presents with oropharyngeal swallowing function which appears grossly within functional limits with adequate airway protection at bedside. No overt signs or symptoms of aspiration noted despite challenging with multiple consecutive straw sips of >3oz thin water. Rotary mastication, oral control and bolus clearance appear adequate for pureed and regular solids, swallow appears timely. Recommend regular diet with thin liquids, medications whole with liquid as tolerated. Shelly Martin remains at mild risk for aspiration given prior CVA, history of gastric issues, questionable pneumonia. No further skilled SLP intervention recommended at this time. SLP will s/o. SLP Visit Diagnosis: Dysphagia, unspecified (R13.10)    Aspiration Risk  Mild aspiration risk    Diet Recommendation Regular;Thin liquid   Liquid Administration via: Cup;Straw Medication Administration: Whole meds with liquid Supervision: Patient able to self feed Compensations: Slow rate;Small sips/bites Postural Changes: Seated upright at 90 degrees    Other  Recommendations Oral Care Recommendations: Oral care BID   Follow up Recommendations None      Frequency and Duration            Prognosis        Swallow Study   General Date of Onset: 08/14/16 HPI: Shelly Martin a 83 y.o.female, admitted 08/14/16, CT scan showed a dilated stomach and proximal small bowel with collapsed distal small bowel. HPI: vertigo, gastric ulcer, TIA, CVA, HTN, constipation, athritis, AKI, acute encephalopathy, partial  colectomy. Her CT scan also showed findings consistent with pneumonia. Shelly Martin with NG tube which she removed 08/16/16; SBO resolved per MD notes who recommends advancing diet as tolerated. Referred for swallowing evaluation, currently on clear liquids. Bedside swallowing evaluation 03/17/14 with  findings of functional oropharyngeal swallow, reg /thin liquids recommended with no SLP f/u.  Type of Study: Bedside Swallow Evaluation Previous Swallow Assessment: see HPI Diet Prior to this Study: Thin liquids Temperature Spikes Noted: No Respiratory Status: Room air History of Recent Intubation: No Behavior/Cognition: Alert;Cooperative;Pleasant mood Oral Cavity Assessment: Dry Oral Care Completed by SLP: No Oral Cavity - Dentition: Adequate natural dentition Vision: Functional for self-feeding Self-Feeding Abilities: Able to feed self Patient Positioning: Upright in bed Baseline Vocal Quality: Hoarse Volitional Cough: Strong Volitional Swallow: Able to elicit    Oral/Motor/Sensory Function Overall Oral Motor/Sensory Function: Within functional limits   Ice Chips Ice chips: Not tested   Thin Liquid Thin Liquid: Within functional limits Presentation: Straw    Nectar Thick Nectar Thick Liquid: Not tested   Honey Thick Honey Thick Liquid: Not tested   Puree Puree: Within functional limits Presentation: Spoon;Self Fed   Solid   GO   Solid: Within functional limits Presentation: Self Shelly Martin 08/16/2016,11:50 AM   Shelly Martin, [place] CF-SLP Speech-Language Pathologist [phone]

## 2016-08-16 NOTE — Progress Notes (Signed)
Subjective: Mult BMs, no abd pain  Objective: Vital signs in last 24 hours: Temp:  [97.8 F (36.6 C)-98.3 F (36.8 C)] 97.8 F (36.6 C) (03/23 2152) Pulse Rate:  [79-88] 82 (03/24 0529) Resp:  [18-20] 18 (03/24 0529) BP: (122-144)/(56-66) 144/66 (03/24 0529) SpO2:  [95 %-100 %] 95 % (03/23 2152) Last BM Date: 08/15/16  Intake/Output from previous day: 03/23 0701 - 03/24 0700 In: 925 [I.V.:825; IV Piggyback:100] Out: 500 [Urine:500] Intake/Output this shift: No intake/output data recorded.  General appearance: alert and cooperative Resp: clear to auscultation bilaterally Cardio: regular rate and rhythm GI: soft, NT, ND, +BS  Lab Results:   Recent Labs  08/15/16 0531 08/16/16 0457  WBC 16.1* 11.4*  HGB 8.5* 8.3*  HCT 28.2* 27.8*  PLT 256 261   BMET  Recent Labs  08/15/16 0531 08/16/16 0457  NA 140 147*  K 3.3* 3.1*  CL 94* 104  CO2 34* 31  GLUCOSE 86 69  BUN 28* 23*  CREATININE 1.79* 1.20*  CALCIUM 8.0* 8.0*   PT/INR  Recent Labs  08/14/16 2129  LABPROT 14.6  INR 1.14   ABG No results for input(s): PHART, HCO3 in the last 72 hours.  Invalid input(s): PCO2, PO2  Studies/Results: Ct Abdomen Pelvis Wo Contrast  Result Date: 08/14/2016 CLINICAL DATA:  Nausea and vomiting. Abdominal pain after starting Phenergan. EXAM: CT ABDOMEN AND PELVIS WITHOUT CONTRAST TECHNIQUE: Multidetector CT imaging of the abdomen and pelvis was performed following the standard protocol without IV contrast. COMPARISON:  01/08/2016. FINDINGS: Lower chest: Patchy airspace opacity in the right middle lobe and right lower lobe. Mild left lower lobe atelectasis. No pleural fluid. Hepatobiliary: Mildly distended gallbladder containing sludge and a 9 mm gallstone. No gallbladder wall thickening or pericholecystic fluid seen. Unremarkable liver. Pancreas: Unremarkable. No pancreatic ductal dilatation or surrounding inflammatory changes. Spleen: Normal in size without focal  abnormality. Adrenals/Urinary Tract: 2 tiny lower pole left renal calculi. Interval minimally dilated ureters and collecting systems. Distended urinary bladder. No urinary tract calculi seen. Stomach/Bowel: Progressive dilatation of the stomach and similar degree of dilatation of the proximal small bowel. Normal caliber distal small bowel with some fecalized bowel contents. No pneumatosis or wall thickening. No well-defined transition point. Inferior displacement of the rectum. Prominent stool throughout normal caliber colon. No evidence of appendicitis. Vascular/Lymphatic: Atheromatous arterial calcifications, including the abdominal aorta and dense coronary artery calcifications. No enlarged lymph nodes. Reproductive: Surgically absent uterus.  No adnexal masses. Other: Small amount of free peritoneal fluid. Mild diffuse subcutaneous edema. Musculoskeletal: Marked right hip degenerative changes with joint space narrowing and subarticular cyst formation. Lumbar and lower thoracic spine degenerative changes. IMPRESSION: 1. Recurrent partial small bowel obstruction. The cause of obstruction is not identified. Reactive ileus due to right lung pneumonia is also a possibility. 2. Patchy airspace opacity in the right middle lobe and right lower lobe suspicious for pneumonia. 3. Rectocele. 4. 2 tiny, nonobstructing lower pole left renal calculi. 5. Interval minimal bilateral hydronephrosis, possibly mechanical due to mild distention of the urinary bladder. 6. Cholelithiasis and sludge in the gallbladder without evidence of cholecystitis. 7. Densely calcified coronary artery and aortic atherosclerosis. Electronically Signed   By: Claudie Revering M.D.   On: 08/14/2016 18:22   Dg Abd 2 Views  Result Date: 08/15/2016 CLINICAL DATA:  Check nasogastric catheter placement EXAM: ABDOMEN - 2 VIEW COMPARISON:  08/14/2016 FINDINGS: Nasogastric catheter is noted coiled within the fundus of the stomach stable from the prior exam.  Scattered large and  small bowel gas is again seen. The degree of small bowel dilatation has improved somewhat in the interval from the prior exam. Fecal material is noted throughout the colon. Degenerative changes of the lumbar spine and hip joints are noted. IMPRESSION: Nasogastric catheter within the stomach. The degree of small bowel dilatation has improved in the interval. Electronically Signed   By: Inez Catalina M.D.   On: 08/15/2016 09:10   Dg Abd Portable 1v-small Bowel Protocol-position Verification  Result Date: 08/16/2016 CLINICAL DATA:  Assess for small bowel obstruction. Initial encounter. EXAM: PORTABLE ABDOMEN - 1 VIEW COMPARISON:  Abdominal radiograph from 08/15/2016 FINDINGS: Contrast is seen filling the colon. There is no evidence for bowel obstruction. The visualized bowel gas pattern is grossly unremarkable. No free intra-abdominal air is identified, though evaluation for free air is limited on a single supine view. No acute osseous abnormalities are seen. Degenerative change is noted at the right hip joint. IMPRESSION: Contrast seen filling the colon. No evidence for bowel obstruction. No free intra-abdominal air seen. Electronically Signed   By: Garald Balding M.D.   On: 08/16/2016 02:20   Dg Abd Portable 1v  Result Date: 08/14/2016 CLINICAL DATA:  NG tube placement EXAM: PORTABLE ABDOMEN - 1 VIEW COMPARISON:  08/14/2016 FINDINGS: Esophageal tube tip is folded upon itself in the gastric fundus. Multiple dilated loops of small bowel up to 5.7 cm, consistent with small bowel obstruction. IMPRESSION: Esophageal tube tip overlies the proximal stomach. Electronically Signed   By: Donavan Foil M.D.   On: 08/14/2016 20:49    Anti-infectives: Anti-infectives    Start     Dose/Rate Route Frequency Ordered Stop   08/16/16 1900  vancomycin (VANCOCIN) 500 mg in sodium chloride 0.9 % 100 mL IVPB  Status:  Discontinued     500 mg 100 mL/hr over 60 Minutes Intravenous Every 48 hours 08/14/16  1858 08/16/16 0735   08/14/16 2030  metroNIDAZOLE (FLAGYL) IVPB 500 mg     500 mg 100 mL/hr over 60 Minutes Intravenous Every 8 hours 08/14/16 2001     08/14/16 1900  metroNIDAZOLE (FLAGYL) IVPB 500 mg  Status:  Discontinued     500 mg 100 mL/hr over 60 Minutes Intravenous  Once 08/14/16 1846 08/14/16 2007   08/14/16 1900  vancomycin (VANCOCIN) IVPB 750 mg/150 ml premix     750 mg 150 mL/hr over 60 Minutes Intravenous  Once 08/14/16 1858 08/14/16 2159   08/14/16 1900  ceFEPIme (MAXIPIME) 500 mg in dextrose 5 % 50 mL IVPB     500 mg 100 mL/hr over 30 Minutes Intravenous Every 24 hours 08/14/16 1858        Assessment/Plan: SBO - resolved, agree with clears. Advance diet as tolerated. Recall PRN.  LOS: 2 days    Vasil Juhasz E 08/16/2016

## 2016-08-16 NOTE — Progress Notes (Signed)
Pt was complaining about feeling uncomfortable about the NG TUBE earlier beginning of the shift I took my time to explain to pt why she needs to have the tube in place she was agitated and didn't want to hear that, I went to pt room to check up on her and she has  Pulled the NG TUBE off, it was connected to intermittent suction and it was not draining out much, abdomen not distended at this moment,  on call physician has been informed called me back on phone to keep pt under observation to see how pt will do without it, will continue to monitor

## 2016-08-16 NOTE — Progress Notes (Signed)
PROGRESS NOTE                                                                                                                                                                                                             Patient Demographics:    Shelly Martin, is a 83 y.o. female, DOB - 1918-09-05, EUM:[id number]  Admit date - 08/14/2016   Admitting Physician Ivor Costa, MD  Outpatient Primary MD for the patient is Inocencio Homes, MD  LOS - 2  Outpatient Specialists:None  Chief Complaint  Patient presents with  . Nausea  . Emesis  . Abdominal Pain       Brief Narrative   83 y.o.femalewith medical history significant of peptic ulcer disease with perforation, colon resection, hypertension, GERD, depression, vertigo, urinary incontinence, TIA, stroke, restless leg syndrome, aortic stenosis anemia, chronic kidney disease-stage II, dCHF, who presents with nausea, vomiting, abdominal pain.   Subjective:    Patient pulled her NG tube out overnight. CBG was low normal this morning. Patient frustrated wanting to eat. Denies abdominal pain, nausea or vomiting.   Assessment  & Plan :    Principal Problem:   SBO (small bowel obstruction) Suspected due to adhesions. Nothing by mouth and NG tube placed which she poorly out overnight. Follow-up abdominal x-ray this morning shows resolution of small bowel obstruction. -Surgery signed off. Started on clears. Seen by SLP would recommend mild aspiration risk and okay with regular diet with thin liquids. -Advance diet slowly. Replenish potassium. Ambulate.  Active Problems:   Aspiration pneumonia (HCC) Narrow antibiotics to IV cefepime. Cultures negative. Continue when necessary nebulizer.  Hypokalemia Replenished. Keep potassium >4.    Essential hypertension  resume home medications.    History of TIAs/ ? Stroke Not on any medications. Defer to PCP.    Acute renal  failure superimposed on stage 2 chronic kidney disease ([place]) Secondary to dehydration. Responded to IV fluids.    Narcotic addiction ([place]) Resumed home dose morphine. Aggressive bowel regimen.      Chronic diastolic CHF (congestive heart failure) (HCC) Euvolemic. Resume torsemide    Pressure injury of skin  Protein calorie malnutrition Continue supplement.  1.    Code Status : Full code. (Discussed with patient this admission)  Family Communication  :None at bedside  Disposition Plan  : Retuned assisted-living tomorrow if improved  Barriers For Discharge : Active symptoms  Consults  : [facility]  Procedures  : None  DVT Prophylaxis  :  Lovenox -  Lab Results  Component Value Date   PLT 261 08/16/2016    Antibiotics  :   Anti-infectives    Start     Dose/Rate Route Frequency Ordered Stop   08/16/16 1900  vancomycin (VANCOCIN) 500 mg in sodium chloride 0.9 % 100 mL IVPB  Status:  Discontinued     500 mg 100 mL/hr over 60 Minutes Intravenous Every 48 hours 08/14/16 1858 08/16/16 0735   08/14/16 2030  metroNIDAZOLE (FLAGYL) IVPB 500 mg     500 mg 100 mL/hr over 60 Minutes Intravenous Every 8 hours 08/14/16 2001     08/14/16 1900  metroNIDAZOLE (FLAGYL) IVPB 500 mg  Status:  Discontinued     500 mg 100 mL/hr over 60 Minutes Intravenous  Once 08/14/16 1846 08/14/16 2007   08/14/16 1900  vancomycin (VANCOCIN) IVPB 750 mg/150 ml premix     750 mg 150 mL/hr over 60 Minutes Intravenous  Once 08/14/16 1858 08/14/16 2159   08/14/16 1900  ceFEPIme (MAXIPIME) 500 mg in dextrose 5 % 50 mL IVPB     500 mg 100 mL/hr over 30 Minutes Intravenous Every 24 hours 08/14/16 1858          Objective:   Vitals:   08/15/16 0510 08/15/16 1500 08/15/16 2152 08/16/16 0529  BP: (!) 112/53 (!) 141/57 (!) 122/56 (!) 144/66  Pulse: 82 79 88 82  Resp: 18 20 20 18   Temp: 98.2 F (36.8 C) 98.3 F (36.8 C) 97.8 F (36.6 C)   TempSrc: Oral Oral    SpO2: 97% 100% 95%   Weight:         Wt Readings from Last 3 Encounters:  08/14/16 49.7 kg (109 lb 8 oz)  08/12/16 43.4 kg (95 lb 9.6 oz)  08/08/16 43.4 kg (95 lb 9.6 oz)     Intake/Output Summary (Last 24 hours) at 08/16/16 1329 Last data filed at 08/15/16 1923  Gross per 24 hour  Intake              925 ml  Output              300 ml  Net              625 ml     Physical Exam  Gen: not in distress HEENT: moist mucosa, supple neck Chest: clear b/l, no added sounds CVS: N S1&S2, no murmurs,  GI: soft, NT, ND, Musculoskeletal: warm, no edema CNS: AAOX3    Data Review:    CBC  Recent Labs Lab 08/14/16 1519 08/15/16 0531 08/16/16 0457  WBC 21.1* 16.1* 11.4*  HGB 9.4* 8.5* 8.3*  HCT 31.3* 28.2* 27.8*  PLT 300 256 261  MCV 81.7 82.5 83.5  MCH 24.5* 24.9* 24.9*  MCHC 30.0 30.1 29.9*  RDW 15.7* 16.2* 16.0*  LYMPHSABS 0.9  --   --   MONOABS 0.7  --   --   EOSABS 0.0  --   --   BASOSABS 0.0  --   --     Chemistries   Recent Labs Lab 08/14/16 1519 08/15/16 0531 08/16/16 0457  NA 137 140 147*  K 4.3 3.3* 3.1*  CL 88* 94* 104  CO2 38* 34* 31  GLUCOSE 144* 86 69  BUN 37* 28* 23*  CREATININE 2.27* 1.79* 1.20*  CALCIUM 9.1 8.0* 8.0*  MG  --   --  2.2  AST 28  --   --   ALT 17  --   --   ALKPHOS 107  --   --   BILITOT 1.1  --   --    ------------------------------------------------------------------------------------------------------------------ No results for input(s): CHOL, HDL, LDLCALC, TRIG, CHOLHDL, LDLDIRECT in the last 72 hours.  Lab Results  Component Value Date   HGBA1C 5.6 03/19/2016   ------------------------------------------------------------------------------------------------------------------ No results for input(s): TSH, T4TOTAL, T3FREE, THYROIDAB in the last 72 hours.  Invalid input(s): FREET3 ------------------------------------------------------------------------------------------------------------------ No results for input(s): VITAMINB12, FOLATE, FERRITIN,  TIBC, IRON, RETICCTPCT in the last 72 hours.  Coagulation profile  Recent Labs Lab 08/14/16 2129  INR 1.14    No results for input(s): DDIMER in the last 72 hours.  Cardiac Enzymes No results for input(s): CKMB, TROPONINI, MYOGLOBIN in the last 168 hours.  Invalid input(s): CK ------------------------------------------------------------------------------------------------------------------    Component Value Date/Time   BNP 475.7 (H) 08/15/2016 0531    Inpatient Medications  Scheduled Meds: . ceFEPime (MAXIPIME) IV  500 mg Intravenous Q24H  . collagenase   Topical Daily  . metronidazole  500 mg Intravenous Q8H  . mirtazapine  15 mg Oral QHS  . morphine  15 mg Oral BID  . pantoprazole (PROTONIX) IV  40 mg Intravenous Q12H  . rOPINIRole  4 mg Oral QHS  . sodium chloride flush  3 mL Intravenous Q12H   Continuous Infusions: . sodium chloride 0.9 % 1,000 mL with potassium chloride 40 mEq infusion 75 mL/hr at 08/16/16 1151   PRN Meds:.acetaminophen **OR** acetaminophen, albuterol, hydrALAZINE, morphine injection, ondansetron (ZOFRAN) IV, zolpidem  Micro Results Recent Results (from the past 240 hour(s))  Blood culture (routine x 2)     Status: None (Preliminary result)   Collection Time: 08/14/16  7:55 PM  Result Value Ref Range Status   Specimen Description BLOOD RIGHT ANTECUBITAL  Final   Special Requests BOTTLES DRAWN AEROBIC AND ANAEROBIC 5CC  Final   Culture NO GROWTH 2 DAYS  Final   Report Status PENDING  Incomplete  Blood culture (routine x 2)     Status: None (Preliminary result)   Collection Time: 08/14/16  8:00 PM  Result Value Ref Range Status   Specimen Description BLOOD RIGHT HAND  Final   Special Requests IN PEDIATRIC BOTTLE 1CC  Final   Culture NO GROWTH 2 DAYS  Final   Report Status PENDING  Incomplete  MRSA PCR Screening     Status: None   Collection Time: 08/14/16 11:25 PM  Result Value Ref Range Status   MRSA by PCR NEGATIVE NEGATIVE Final     Comment:        The GeneXpert MRSA Assay (FDA approved for NASAL specimens only), is one component of a comprehensive MRSA colonization surveillance program. It is not intended to diagnose MRSA infection nor to guide or monitor treatment for MRSA infections.     Radiology Reports Ct Abdomen Pelvis Wo Contrast  Result Date: 08/14/2016 CLINICAL DATA:  Nausea and vomiting. Abdominal pain after starting Phenergan. EXAM: CT ABDOMEN AND PELVIS WITHOUT CONTRAST TECHNIQUE: Multidetector CT imaging of the abdomen and pelvis was performed following the standard protocol without IV contrast. COMPARISON:  01/08/2016. FINDINGS: Lower chest: Patchy airspace opacity in the right middle lobe and right lower lobe. Mild left lower lobe atelectasis. No pleural fluid. Hepatobiliary: Mildly distended gallbladder containing sludge and a 9 mm gallstone. No gallbladder wall thickening or pericholecystic fluid seen. Unremarkable liver. Pancreas: Unremarkable. No pancreatic ductal dilatation or surrounding inflammatory changes.  Spleen: Normal in size without focal abnormality. Adrenals/Urinary Tract: 2 tiny lower pole left renal calculi. Interval minimally dilated ureters and collecting systems. Distended urinary bladder. No urinary tract calculi seen. Stomach/Bowel: Progressive dilatation of the stomach and similar degree of dilatation of the proximal small bowel. Normal caliber distal small bowel with some fecalized bowel contents. No pneumatosis or wall thickening. No well-defined transition point. Inferior displacement of the rectum. Prominent stool throughout normal caliber colon. No evidence of appendicitis. Vascular/Lymphatic: Atheromatous arterial calcifications, including the abdominal aorta and dense coronary artery calcifications. No enlarged lymph nodes. Reproductive: Surgically absent uterus.  No adnexal masses. Other: Small amount of free peritoneal fluid. Mild diffuse subcutaneous edema. Musculoskeletal: Marked  right hip degenerative changes with joint space narrowing and subarticular cyst formation. Lumbar and lower thoracic spine degenerative changes. IMPRESSION: 1. Recurrent partial small bowel obstruction. The cause of obstruction is not identified. Reactive ileus due to right lung pneumonia is also a possibility. 2. Patchy airspace opacity in the right middle lobe and right lower lobe suspicious for pneumonia. 3. Rectocele. 4. 2 tiny, nonobstructing lower pole left renal calculi. 5. Interval minimal bilateral hydronephrosis, possibly mechanical due to mild distention of the urinary bladder. 6. Cholelithiasis and sludge in the gallbladder without evidence of cholecystitis. 7. Densely calcified coronary artery and aortic atherosclerosis. Electronically Signed   By: Claudie Revering M.D.   On: 08/14/2016 18:22   Dg Abd 2 Views  Result Date: 08/15/2016 CLINICAL DATA:  Check nasogastric catheter placement EXAM: ABDOMEN - 2 VIEW COMPARISON:  08/14/2016 FINDINGS: Nasogastric catheter is noted coiled within the fundus of the stomach stable from the prior exam. Scattered large and small bowel gas is again seen. The degree of small bowel dilatation has improved somewhat in the interval from the prior exam. Fecal material is noted throughout the colon. Degenerative changes of the lumbar spine and hip joints are noted. IMPRESSION: Nasogastric catheter within the stomach. The degree of small bowel dilatation has improved in the interval. Electronically Signed   By: Inez Catalina M.D.   On: 08/15/2016 09:10   Dg Abd Portable 1v-small Bowel Protocol-position Verification  Result Date: 08/16/2016 CLINICAL DATA:  Assess for small bowel obstruction. Initial encounter. EXAM: PORTABLE ABDOMEN - 1 VIEW COMPARISON:  Abdominal radiograph from 08/15/2016 FINDINGS: Contrast is seen filling the colon. There is no evidence for bowel obstruction. The visualized bowel gas pattern is grossly unremarkable. No free intra-abdominal air is  identified, though evaluation for free air is limited on a single supine view. No acute osseous abnormalities are seen. Degenerative change is noted at the right hip joint. IMPRESSION: Contrast seen filling the colon. No evidence for bowel obstruction. No free intra-abdominal air seen. Electronically Signed   By: Garald Balding M.D.   On: 08/16/2016 02:20   Dg Abd Portable 1v  Result Date: 08/14/2016 CLINICAL DATA:  NG tube placement EXAM: PORTABLE ABDOMEN - 1 VIEW COMPARISON:  08/14/2016 FINDINGS: Esophageal tube tip is folded upon itself in the gastric fundus. Multiple dilated loops of small bowel up to 5.7 cm, consistent with small bowel obstruction. IMPRESSION: Esophageal tube tip overlies the proximal stomach. Electronically Signed   By: Donavan Foil M.D.   On: 08/14/2016 20:49   Dg Hip Unilat With Pelvis 1v Right  Result Date: 08/16/2016 CLINICAL DATA:  Pain and limited range of motion EXAM: DG HIP (WITH OR WITHOUT PELVIS) RIGHT:  2 V COMPARISON:  None. FINDINGS: Frontal pelvis as well as lateral right hip images were obtained. Bones are  osteoporotic. No acute fracture or dislocation is evident. There is moderate narrowing of both hip joints. There is subtle flattening of the femoral head on the right, likely due to a degree of underlying avascular necrosis in the right femoral head. There is contrast in portions of small bowel and colon. Postoperative changes noted in the pelvis. IMPRESSION: Osteoarthritic change in both hip joints, more severe on the right. Suspect a degree of avascular necrosis in the right femoral head. Bones are diffusely osteoporotic. No acute fracture or dislocation. Electronically Signed   By: Lowella Grip III M.D.   On: 08/16/2016 10:35    Time Spent in minutes  25   Louellen Molder M.D on 08/16/2016 at 1:29 PM  Between 7am to 7pm - Pager - [phone]  After 7pm go to [email or web address] - password [facility]  [facility] -  Office  [phone]

## 2016-08-17 DIAGNOSIS — I5032 Chronic diastolic (congestive) heart failure: Secondary | ICD-10-CM

## 2016-08-17 DIAGNOSIS — N170 Acute kidney failure with tubular necrosis: Secondary | ICD-10-CM

## 2016-08-17 DIAGNOSIS — F1120 Opioid dependence, uncomplicated: Secondary | ICD-10-CM

## 2016-08-17 LAB — BASIC METABOLIC PANEL
ANION GAP: 8 (ref 5–15)
BUN: 22 mg/dL — AB (ref 4–21)
BUN: 22 mg/dL — AB (ref 6–20)
CO2: 28 mmol/L (ref 22–32)
CREATININE: 1.13 mg/dL — AB (ref 0.44–1.00)
Calcium: 7.8 mg/dL — ABNORMAL LOW (ref 8.9–10.3)
Chloride: 106 mmol/L (ref 101–111)
Creatinine: 1.1 mg/dL (ref 0.5–1.1)
GFR calc Af Amer: 46 mL/min — ABNORMAL LOW (ref >60)
GFR calc non Af Amer: 40 mL/min — ABNORMAL LOW (ref >60)
GLUCOSE: 92 mg/dL
GLUCOSE: 92 mg/dL (ref 65–99)
Potassium: 3.9 mmol/L (ref 3.5–5.1)
Sodium: 142 mmol/L (ref 135–145)
Sodium: 142 mmol/L (ref 137–147)

## 2016-08-17 LAB — GLUCOSE, CAPILLARY: Glucose-Capillary: 81 mg/dL (ref 65–99)

## 2016-08-17 MED ORDER — POLYETHYLENE GLYCOL 3350 17 G PO PACK: 17.0000 g | PACK | Freq: Every day | ORAL | 0 refills | Status: DC

## 2016-08-17 MED ORDER — MORPHINE SULFATE ER 15 MG PO TBCR: 15.0000 mg | EXTENDED_RELEASE_TABLET | Freq: Two times a day (BID) | ORAL | 0 refills | Status: DC

## 2016-08-17 MED ORDER — POLYETHYLENE GLYCOL 3350 17 G PO PACK
17.0000 g | PACK | Freq: Every day | ORAL | Status: DC
  Administered 2016-08-17: 17 g via ORAL
  Filled 2016-08-17: qty 1

## 2016-08-17 MED ORDER — CLINDAMYCIN HCL 300 MG PO CAPS: 300.0000 mg | ORAL_CAPSULE | Freq: Three times a day (TID) | ORAL | 0 refills | Status: AC

## 2016-08-17 NOTE — Discharge Summary (Signed)
Physician Discharge Summary  Shelly Martin ZOX:[id number] DOB: 1919-01-14 DOA: 08/14/2016  PCP: Inocencio Homes, MD  Admit date: 08/14/2016 Discharge date: 08/17/2016  Admitted From: SNF Disposition: SNF( [facility])  Recommendations for Outpatient Follow-up:  1. Follow up with MD at SNF in 1 week. Patient will complete total 7 days of antibiotics on 08/21/2016   Equipment/Devices: per therapy at SNF  Discharge Condition:Fair CODE STATUS: Full code Diet recommendation: Regular    Discharge Diagnoses:  Principal Problem:   SBO (small bowel obstruction)   Active Problems:   Narcotic addiction ([place])   Aspiration pneumonia ([place])   H/O   Essential hypertension   History of TIAs   Acute renal failure superimposed on stage 2 chronic kidney disease (HCC)   Anemia   Chronic diastolic CHF (congestive heart failure) (HCC)   Pressure injury of skin   Brief narrative/history of present illness Refer to admission H&P for details, in brief,83 y.o.femalewith medical history significant of peptic ulcer disease with perforation, colon resection, hypertension, GERD, depression, vertigo, urinary incontinence, TIA, stroke, restless leg syndrome, aortic stenosis anemia, chronic kidney disease-stage II, dCHF, who presents with nausea, vomiting, abdominal pain.  Hospital course Principal Problem:   SBO (small bowel obstruction) Suspected due to adhesions. Nothing by mouth and NG tube placed which she poorly out overnight. Follow-up abdominal x-ray this morning shows resolution of small bowel obstruction. -Surgery signed off. Started on clears. Seen by SLP would recommend mild aspiration risk and okay with regular diet with thin liquids. Tolerating advanced diet. No further symptoms.  Active Problems:   Aspiration pneumonia ([place]) Received empiric antibiotics. Cultures negative. She will be discharged on oral clindamycin to complete 7 days course.  Hypokalemia Replenished. Keep  potassium >4.    Essential hypertension  resume home medications.    History of TIAs/ ? Stroke Not on any medications. Defer to PCP.    Acute renal failure superimposed on stage 2 chronic kidney disease ([place]) Secondary to dehydration. Responded to IV fluids.    Narcotic addiction ([place]) Resumed home dose morphine. Continue bowel regimen. Patient home medications list shows she is also on Vicodin (specify dictation). She has not been requiring this year so I have discussed.      Chronic diastolic CHF (congestive heart failure) (HCC) Euvolemic. Resume torsemide  Unstageable pressure injury to right upper buttock Seen by wound care. Recommend santyl  ointment to provide enzymatic debridement.    Protein calorie malnutrition Continue supplement.  Hypokalemia Replenished     Family Communication  :None at bedside  Disposition Plan  : Return to SNF    Consults  : [facility]  Procedures  : None    Discharge Instructions   Allergies as of 08/17/2016      Reactions   Amoxicillin Other (See Comments)   Reaction unknown   Baclofen Other (See Comments)   "fuzzy in the eyes" and dizzy   Hctz [hydrochlorothiazide]    nausea   Valium [diazepam] Other (See Comments)   Pt became unresponsive and O2 sats dropped.      Medication List    STOP taking these medications   HYDROcodone-acetaminophen 7.5-325 MG tablet Commonly known as:  NORCO     TAKE these medications   acetaminophen 325 MG tablet Commonly known as:  TYLENOL Take 650 mg by mouth every 6 (six) hours as needed. Notify MD if not relieved. Not to exceed 3000 mg in 24 hour period   amLODipine 5 MG tablet Commonly known as:  NORVASC Take 5  mg by mouth daily.   CHLORASEPTIC MAX SORE THROAT 1.5-33 % Liqd Generic drug:  Phenol-Glycerin Use as directed 1 spray in the mouth or throat 3 (three) times daily as needed. For throat irritation/pain. If sore throat persist notify the MD    clindamycin 300 MG capsule Commonly known as:  CLEOCIN Take 1 capsule (300 mg total) by mouth 3 (three) times daily.   feeding supplement Liqd Take 1 Container by mouth 2 (two) times daily between meals.   lubiprostone 24 MCG capsule Commonly known as:  AMITIZA Take 24 mcg by mouth 2 (two) times daily with a meal.   magnesium hydroxide 400 MG/5ML suspension Commonly known as:  MILK OF MAGNESIA Take 30 mLs by mouth daily as needed for mild constipation. Reported on 07/27/2015   mirtazapine 15 MG tablet Commonly known as:  REMERON Take 15 mg by mouth at bedtime.   morphine 15 MG 12 hr tablet Commonly known as:  MS CONTIN Take 1 tablet (15 mg total) by mouth 2 (two) times daily.   multivitamin tablet Take 1 tablet by mouth daily.   MYRBETRIQ 50 MG Tb24 tablet Generic drug:  mirabegron ER Take 50 mg by mouth daily.   ondansetron 4 MG tablet Commonly known as:  ZOFRAN Take 4 mg by mouth every 12 (twelve) hours as needed for nausea or vomiting.   pantoprazole 40 MG tablet Commonly known as:  PROTONIX Take 40 mg by mouth daily.   polyethylene glycol packet Commonly known as:  MIRALAX / GLYCOLAX Take 17 g by mouth daily. Start taking on:  08/18/2016   potassium chloride 10 MEQ tablet Commonly known as:  K-DUR Take 20 mEq by mouth daily. 2 tablets   rOPINIRole 4 MG tablet Commonly known as:  REQUIP Take 4 mg by mouth at bedtime.   SYSTANE BALANCE 0.6 % Soln Generic drug:  Propylene Glycol Place 1 drop into both eyes 2 (two) times daily.   torsemide 20 MG tablet Commonly known as:  DEMADEX Take 40 mg by mouth every morning. 2 tabs      Follow-up Information    MD at SNF in 1 week Follow up.          Allergies  Allergen Reactions  . Amoxicillin Other (See Comments)    Reaction unknown  . Baclofen Other (See Comments)    "fuzzy in the eyes" and dizzy  . Hctz [Hydrochlorothiazide]     nausea  . Valium [Diazepam] Other (See Comments)    Pt became  unresponsive and O2 sats dropped.        Procedures/Studies: Ct Abdomen Pelvis Wo Contrast  Result Date: 08/14/2016 CLINICAL DATA:  Nausea and vomiting. Abdominal pain after starting Phenergan. EXAM: CT ABDOMEN AND PELVIS WITHOUT CONTRAST TECHNIQUE: Multidetector CT imaging of the abdomen and pelvis was performed following the standard protocol without IV contrast. COMPARISON:  01/08/2016. FINDINGS: Lower chest: Patchy airspace opacity in the right middle lobe and right lower lobe. Mild left lower lobe atelectasis. No pleural fluid. Hepatobiliary: Mildly distended gallbladder containing sludge and a 9 mm gallstone. No gallbladder wall thickening or pericholecystic fluid seen. Unremarkable liver. Pancreas: Unremarkable. No pancreatic ductal dilatation or surrounding inflammatory changes. Spleen: Normal in size without focal abnormality. Adrenals/Urinary Tract: 2 tiny lower pole left renal calculi. Interval minimally dilated ureters and collecting systems. Distended urinary bladder. No urinary tract calculi seen. Stomach/Bowel: Progressive dilatation of the stomach and similar degree of dilatation of the proximal small bowel. Normal caliber distal small bowel with some  fecalized bowel contents. No pneumatosis or wall thickening. No well-defined transition point. Inferior displacement of the rectum. Prominent stool throughout normal caliber colon. No evidence of appendicitis. Vascular/Lymphatic: Atheromatous arterial calcifications, including the abdominal aorta and dense coronary artery calcifications. No enlarged lymph nodes. Reproductive: Surgically absent uterus.  No adnexal masses. Other: Small amount of free peritoneal fluid. Mild diffuse subcutaneous edema. Musculoskeletal: Marked right hip degenerative changes with joint space narrowing and subarticular cyst formation. Lumbar and lower thoracic spine degenerative changes. IMPRESSION: 1. Recurrent partial small bowel obstruction. The cause of  obstruction is not identified. Reactive ileus due to right lung pneumonia is also a possibility. 2. Patchy airspace opacity in the right middle lobe and right lower lobe suspicious for pneumonia. 3. Rectocele. 4. 2 tiny, nonobstructing lower pole left renal calculi. 5. Interval minimal bilateral hydronephrosis, possibly mechanical due to mild distention of the urinary bladder. 6. Cholelithiasis and sludge in the gallbladder without evidence of cholecystitis. 7. Densely calcified coronary artery and aortic atherosclerosis. Electronically Signed   By: Claudie Revering M.D.   On: 08/14/2016 18:22   Dg Abd 2 Views  Result Date: 08/15/2016 CLINICAL DATA:  Check nasogastric catheter placement EXAM: ABDOMEN - 2 VIEW COMPARISON:  08/14/2016 FINDINGS: Nasogastric catheter is noted coiled within the fundus of the stomach stable from the prior exam. Scattered large and small bowel gas is again seen. The degree of small bowel dilatation has improved somewhat in the interval from the prior exam. Fecal material is noted throughout the colon. Degenerative changes of the lumbar spine and hip joints are noted. IMPRESSION: Nasogastric catheter within the stomach. The degree of small bowel dilatation has improved in the interval. Electronically Signed   By: Inez Catalina M.D.   On: 08/15/2016 09:10   Dg Abd Portable 1v-small Bowel Protocol-position Verification  Result Date: 08/16/2016 CLINICAL DATA:  Assess for small bowel obstruction. Initial encounter. EXAM: PORTABLE ABDOMEN - 1 VIEW COMPARISON:  Abdominal radiograph from 08/15/2016 FINDINGS: Contrast is seen filling the colon. There is no evidence for bowel obstruction. The visualized bowel gas pattern is grossly unremarkable. No free intra-abdominal air is identified, though evaluation for free air is limited on a single supine view. No acute osseous abnormalities are seen. Degenerative change is noted at the right hip joint. IMPRESSION: Contrast seen filling the colon. No  evidence for bowel obstruction. No free intra-abdominal air seen. Electronically Signed   By: Garald Balding M.D.   On: 08/16/2016 02:20   Dg Abd Portable 1v  Result Date: 08/14/2016 CLINICAL DATA:  NG tube placement EXAM: PORTABLE ABDOMEN - 1 VIEW COMPARISON:  08/14/2016 FINDINGS: Esophageal tube tip is folded upon itself in the gastric fundus. Multiple dilated loops of small bowel up to 5.7 cm, consistent with small bowel obstruction. IMPRESSION: Esophageal tube tip overlies the proximal stomach. Electronically Signed   By: Donavan Foil M.D.   On: 08/14/2016 20:49   Dg Hip Unilat With Pelvis 1v Right  Result Date: 08/16/2016 CLINICAL DATA:  Pain and limited range of motion EXAM: DG HIP (WITH OR WITHOUT PELVIS) RIGHT:  2 V COMPARISON:  None. FINDINGS: Frontal pelvis as well as lateral right hip images were obtained. Bones are osteoporotic. No acute fracture or dislocation is evident. There is moderate narrowing of both hip joints. There is subtle flattening of the femoral head on the right, likely due to a degree of underlying avascular necrosis in the right femoral head. There is contrast in portions of small bowel and colon. Postoperative changes noted  in the pelvis. IMPRESSION: Osteoarthritic change in both hip joints, more severe on the right. Suspect a degree of avascular necrosis in the right femoral head. Bones are diffusely osteoporotic. No acute fracture or dislocation. Electronically Signed   By: Lowella Grip III M.D.   On: 08/16/2016 10:35       Subjective: No further abdominal pain or distention. . Tolerating diet.  Discharge Exam: Vitals:   08/16/16 2105 08/17/16 0513  BP: 125/61 (!) 150/59  Pulse: 79 67  Resp:  18  Temp: 99.1 F (37.3 C) 98 F (36.7 C)   Vitals:   08/16/16 0529 08/16/16 1418 08/16/16 2105 08/17/16 0513  BP: (!) 144/66 (!) 108/47 125/61 (!) 150/59  Pulse: 82 84 79 67  Resp: 18 17  18   Temp:  99.2 F (37.3 C) 99.1 F (37.3 C) 98 F (36.7 C)   TempSrc:  Oral Oral Oral  SpO2:  94% 95% 96%  Weight:        Gen: not in distress HEENT: moist mucosa, supple neck Chest: clear b/l, no added sounds CVS: N B0&F7, systolic murmur 3/6 GI: soft, NT, ND,BS+ Musculoskeletal: warm, no edema CNS: Alert and oriented   The results of significant diagnostics from this hospitalization (including imaging, microbiology, ancillary and laboratory) are listed below for reference.     Microbiology: Recent Results (from the past 240 hour(s))  Blood culture (routine x 2)     Status: None (Preliminary result)   Collection Time: 08/14/16  7:55 PM  Result Value Ref Range Status   Specimen Description BLOOD RIGHT ANTECUBITAL  Final   Special Requests BOTTLES DRAWN AEROBIC AND ANAEROBIC 5CC  Final   Culture NO GROWTH 2 DAYS  Final   Report Status PENDING  Incomplete  Blood culture (routine x 2)     Status: None (Preliminary result)   Collection Time: 08/14/16  8:00 PM  Result Value Ref Range Status   Specimen Description BLOOD RIGHT HAND  Final   Special Requests IN PEDIATRIC BOTTLE 1CC  Final   Culture NO GROWTH 2 DAYS  Final   Report Status PENDING  Incomplete  Urine culture     Status: Abnormal   Collection Time: 08/14/16  8:00 PM  Result Value Ref Range Status   Specimen Description URINE, RANDOM  Final   Special Requests NONE  Final   Culture MULTIPLE SPECIES PRESENT, SUGGEST RECOLLECTION (A)  Final   Report Status 08/16/2016 FINAL  Final  MRSA PCR Screening     Status: None   Collection Time: 08/14/16 11:25 PM  Result Value Ref Range Status   MRSA by PCR NEGATIVE NEGATIVE Final    Comment:        The GeneXpert MRSA Assay (FDA approved for NASAL specimens only), is one component of a comprehensive MRSA colonization surveillance program. It is not intended to diagnose MRSA infection nor to guide or monitor treatment for MRSA infections.      Labs: BNP (last 3 results)  Recent Labs  08/15/16 0531  BNP 475.7*   Basic  Metabolic Panel:  Recent Labs Lab 08/14/16 1519 08/15/16 0531 08/16/16 0457 08/17/16 0343  NA 137 140 147* 142  K 4.3 3.3* 3.1* 3.9  CL 88* 94* 104 106  CO2 38* 34* 31 28  GLUCOSE 144* 86 69 92  BUN 37* 28* 23* 22*  CREATININE 2.27* 1.79* 1.20* 1.13*  CALCIUM 9.1 8.0* 8.0* 7.8*  MG  --   --  2.2  --    Liver  Function Tests:  Recent Labs Lab 08/14/16 1519  AST 28  ALT 17  ALKPHOS 107  BILITOT 1.1  PROT 6.6  ALBUMIN 3.4*   No results for input(s): LIPASE, AMYLASE in the last 168 hours. No results for input(s): AMMONIA in the last 168 hours. CBC:  Recent Labs Lab 08/14/16 1519 08/15/16 0531 08/16/16 0457  WBC 21.1* 16.1* 11.4*  NEUTROABS 19.6*  --   --   HGB 9.4* 8.5* 8.3*  HCT 31.3* 28.2* 27.8*  MCV 81.7 82.5 83.5  PLT 300 256 261   Cardiac Enzymes: No results for input(s): CKTOTAL, CKMB, CKMBINDEX, TROPONINI in the last 168 hours. BNP: Invalid input(s): POCBNP CBG:  Recent Labs Lab 08/15/16 0818 08/16/16 0820 08/16/16 0903 08/17/16 0731  GLUCAP 79 61* 119* 81   D-Dimer No results for input(s): DDIMER in the last 72 hours. Hgb A1c No results for input(s): HGBA1C in the last 72 hours. Lipid Profile No results for input(s): CHOL, HDL, LDLCALC, TRIG, CHOLHDL, LDLDIRECT in the last 72 hours. Thyroid function studies No results for input(s): TSH, T4TOTAL, T3FREE, THYROIDAB in the last 72 hours.  Invalid input(s): FREET3 Anemia work up No results for input(s): VITAMINB12, FOLATE, FERRITIN, TIBC, IRON, RETICCTPCT in the last 72 hours. Urinalysis    Component Value Date/Time   COLORURINE YELLOW 08/14/2016 1750   APPEARANCEUR CLEAR 08/14/2016 1750   LABSPEC 1.009 08/14/2016 1750   PHURINE 7.0 08/14/2016 1750   GLUCOSEU NEGATIVE 08/14/2016 1750   HGBUR NEGATIVE 08/14/2016 1750   BILIRUBINUR NEGATIVE 08/14/2016 1750   BILIRUBINUR neg 04/15/2012 1702   KETONESUR NEGATIVE 08/14/2016 1750   PROTEINUR NEGATIVE 08/14/2016 1750   UROBILINOGEN 0.2  05/25/2014 0327   NITRITE NEGATIVE 08/14/2016 1750   LEUKOCYTESUR TRACE (A) 08/14/2016 1750   Sepsis Labs Invalid input(s): PROCALCITONIN,  WBC,  LACTICIDVEN Microbiology Recent Results (from the past 240 hour(s))  Blood culture (routine x 2)     Status: None (Preliminary result)   Collection Time: 08/14/16  7:55 PM  Result Value Ref Range Status   Specimen Description BLOOD RIGHT ANTECUBITAL  Final   Special Requests BOTTLES DRAWN AEROBIC AND ANAEROBIC 5CC  Final   Culture NO GROWTH 2 DAYS  Final   Report Status PENDING  Incomplete  Blood culture (routine x 2)     Status: None (Preliminary result)   Collection Time: 08/14/16  8:00 PM  Result Value Ref Range Status   Specimen Description BLOOD RIGHT HAND  Final   Special Requests IN PEDIATRIC BOTTLE 1CC  Final   Culture NO GROWTH 2 DAYS  Final   Report Status PENDING  Incomplete  Urine culture     Status: Abnormal   Collection Time: 08/14/16  8:00 PM  Result Value Ref Range Status   Specimen Description URINE, RANDOM  Final   Special Requests NONE  Final   Culture MULTIPLE SPECIES PRESENT, SUGGEST RECOLLECTION (A)  Final   Report Status 08/16/2016 FINAL  Final  MRSA PCR Screening     Status: None   Collection Time: 08/14/16 11:25 PM  Result Value Ref Range Status   MRSA by PCR NEGATIVE NEGATIVE Final    Comment:        The GeneXpert MRSA Assay (FDA approved for NASAL specimens only), is one component of a comprehensive MRSA colonization surveillance program. It is not intended to diagnose MRSA infection nor to guide or monitor treatment for MRSA infections.      Time coordinating discharge: Over 30 minutes  SIGNED:   Ector Laurel,  Flonnie Overman, MD  [facility] 08/17/2016, 10:17 AM Pager   If 7PM-7AM, please contact night-coverage [email or web address] Password [email or web address]

## 2016-08-17 NOTE — Clinical Social Work Note (Signed)
Pt is ready for discharge today and will return to [facility]. Pt and family are aware and agreeable to discharge plan. faciltiy is ready to admit pt as they have received discharge information. RN will call report. [facility] will provide transportation. CSW is signing off as no further needs identified.   Darden Dates, MSW, LCSW  Clinical Social Worker  [phone]

## 2016-08-17 NOTE — Progress Notes (Addendum)
Report called to [facility]. All questions answered. Pt D/C'd via [facility].

## 2016-08-17 NOTE — Care Management Note (Signed)
Case Management Note  Patient Details  Name: Shelly Martin MRN: [id number] Date of Birth: August 11, 1918  Subjective/Objective: 83 y.o. F admitted 3/22 with SBO ready to go to SNF today per Clementeen Graham, MD. Starleen Blue, CSW aware [phone]).                    Action/Plan:CM will sign off for now but will be available should additional discharge needs arise or disposition change.    Expected Discharge Date:  08/16/16               Expected Discharge Plan:  Skilled Nursing Facility  In-House Referral:  Clinical Social Work  Discharge planning Services  CM Consult  Post Acute Care Choice:  NA Choice offered to:  Patient  DME Arranged:  N/A DME Agency:  NA  HH Arranged:  NA HH Agency:  NA  Status of Service:  Completed, signed off  If discussed at [employer] of Stay Meetings, dates discussed:    Additional Comments:  Delrae Sawyers, RN 08/17/2016, 9:15 AM

## 2016-08-18 ENCOUNTER — Telehealth: Payer: Self-pay

## 2016-08-18 ENCOUNTER — Encounter: Payer: Self-pay | Admitting: Internal Medicine

## 2016-08-18 ENCOUNTER — Non-Acute Institutional Stay (SKILLED_NURSING_FACILITY): Payer: Medicare Other | Admitting: Internal Medicine

## 2016-08-18 DIAGNOSIS — I5032 Chronic diastolic (congestive) heart failure: Secondary | ICD-10-CM

## 2016-08-18 DIAGNOSIS — N170 Acute kidney failure with tubular necrosis: Secondary | ICD-10-CM

## 2016-08-18 DIAGNOSIS — I10 Essential (primary) hypertension: Secondary | ICD-10-CM

## 2016-08-18 DIAGNOSIS — E876 Hypokalemia: Secondary | ICD-10-CM

## 2016-08-18 DIAGNOSIS — J690 Pneumonitis due to inhalation of food and vomit: Secondary | ICD-10-CM | POA: Diagnosis not present

## 2016-08-18 DIAGNOSIS — K56609 Unspecified intestinal obstruction, unspecified as to partial versus complete obstruction: Secondary | ICD-10-CM | POA: Diagnosis not present

## 2016-08-18 DIAGNOSIS — N182 Chronic kidney disease, stage 2 (mild): Secondary | ICD-10-CM

## 2016-08-18 DIAGNOSIS — L89310 Pressure ulcer of right buttock, unstageable: Secondary | ICD-10-CM | POA: Diagnosis not present

## 2016-08-18 DIAGNOSIS — G2581 Restless legs syndrome: Secondary | ICD-10-CM | POA: Diagnosis not present

## 2016-08-18 LAB — LEGIONELLA PNEUMOPHILA SEROGP 1 UR AG: L. PNEUMOPHILA SEROGP 1 UR AG: NEGATIVE

## 2016-08-18 NOTE — Progress Notes (Signed)
: Provider:  [name], MD Location:  [facility] Room Number: 347Q Place of Service:  SNF ([phone])  PCP: Inocencio Homes, MD Patient Care Team: [name], MD as PCP - General (Internal Medicine) [name], MD as Consulting Physician (Orthopedic Surgery) [name], MD as Consulting Physician (Otolaryngology)  Extended Emergency Contact Information Primary Emergency Contact: [name] Address: [place], [place] [address] [name] of [place] Phone: [phone] Relation: Daughter Secondary Emergency Contact: [place], [place] of [place] Mobile Phone: [phone] Relation: Son     Allergies: Amoxicillin; Baclofen; Hctz [hydrochlorothiazide]; and Valium [diazepam]  Chief Complaint  Patient presents with  . Hospitalization Follow-up    Admit to Facility    HPI: Patient is 83 y.o. female with hx of peptic ulcer disease with perforation, colon resection, hypertension, GERD, depression, vertigo, urinary incontinence, TIA, stroke, restless leg syndrome, aortic stenosis anemia, chronic kidney disease-stage II, dCHF, who presents with nausea, vomiting, abdominal pain. Abdominal pain onset 3 days ago, KUG/upright, showed no acute process except constipation. Pt received an enema with decent results and felt better day 2. Day 3 pt had onset pain again, this time with vomiting and pt was sent to ED because she has hx SBO. She has had no CP, cough or SOB or acute urinary sx. In ED pt was found to have WBC 21.1, lactate is 1.12, negative troponin, urinalysis with trace amount of leukocytes, worsening renal function, temperature 99.3, no tachycardia, oxygen saturation 94% on room air. Pt patient isn't admitted to telemetry bed as inpatient. Gen. surgeon, Dr. Philipp Ovens  was consulted. CT abdomen/pelvis showed" Recurrent partial small bowel obstruction. The cause of obstruction  is not identified. Reactive ileus due to right lung pneumonia is also a possibility". Tx conservatively with NG tube and resolved. Pt received empiric antibiotics in hosp and was d/c on orals for an aspiration PNA. Acute on CKD2 resolved with IVF. Pt is admitted back to SNF for residential care. While at SNF pt will be followed for HTN, tx with norvasc,, chronic dCHF, tx with demadex  and RLS , tx with requip.   Past Medical History:  Diagnosis Date  . Acute encephalopathy   . AKI (acute kidney injury) ([place])   . Altered mental state   . Anemia   . Aortic stenosis   . Arthritis    Back, knees  . Bowel incontinence   . Colon polyps   . Constipation   . Gastric ulcer    years ago  . Hypertension   . Hypokalemia   . Restless legs syndrome    on Requip  . Situational stress   . Spinal stenosis   . Stroke ([place])    Mini, no residual  . TIA (transient ischemic attack)   . Ulcer causing bleeding and hole in wall of stomach or small intestine   . Urinary bladder calculus   . Urinary incontinence   . Vertigo     Past Surgical History:  Procedure Laterality Date  . COLON RESECTION  50 years ago  . LUMBAR LAMINECTOMY/DECOMPRESSION MICRODISCECTOMY  06/25/2011   Procedure: LUMBAR LAMINECTOMY/DECOMPRESSION MICRODISCECTOMY;  Surgeon: Hosie Spangle, MD;  Location: [facility];  Service: Neurosurgery;  Laterality: Right;  RIGHT Lumbar Two-Three hemilaminectomy and microdiskectomy  . TONSILLECTOMY    . UTERINE FIBROID SURGERY    .  VAGINAL HYSTERECTOMY      Allergies as of 08/18/2016      Reactions   Amoxicillin Other (See Comments)   Reaction unknown   Baclofen Other (See Comments)   "fuzzy in the eyes" and dizzy   Hctz [hydrochlorothiazide]    nausea   Valium [diazepam] Other (See Comments)   Pt became unresponsive and O2 sats dropped.      Medication List       Accurate as of 08/18/16  8:42 AM. Always use your most recent med list.          acetaminophen 325 MG  tablet Commonly known as:  TYLENOL Take 650 mg by mouth every 6 (six) hours as needed. Notify MD if not relieved. Not to exceed 3000 mg in 24 hour period   amLODipine 5 MG tablet Commonly known as:  NORVASC Take 5 mg by mouth daily.   CHLORASEPTIC MAX SORE THROAT 1.5-33 % Liqd Generic drug:  Phenol-Glycerin Use as directed 1 spray in the mouth or throat 3 (three) times daily as needed. For throat irritation/pain. If sore throat persist notify the MD   clindamycin 300 MG capsule Commonly known as:  CLEOCIN Take 1 capsule (300 mg total) by mouth 3 (three) times daily.   feeding supplement Liqd Take 1 Container by mouth 2 (two) times daily between meals.   lubiprostone 24 MCG capsule Commonly known as:  AMITIZA Take 24 mcg by mouth 2 (two) times daily with a meal.   magnesium hydroxide 400 MG/5ML suspension Commonly known as:  MILK OF MAGNESIA Take 30 mLs by mouth daily as needed for mild constipation. Reported on 07/27/2015   mirtazapine 15 MG tablet Commonly known as:  REMERON Take 15 mg by mouth at bedtime.   morphine 15 MG 12 hr tablet Commonly known as:  MS CONTIN Take 1 tablet (15 mg total) by mouth 2 (two) times daily.   multivitamin tablet Take 1 tablet by mouth daily.   MYRBETRIQ 50 MG Tb24 tablet Generic drug:  mirabegron ER Take 50 mg by mouth daily.   ondansetron 4 MG tablet Commonly known as:  ZOFRAN Take 4 mg by mouth every 12 (twelve) hours as needed for nausea or vomiting.   pantoprazole 40 MG tablet Commonly known as:  PROTONIX Take 40 mg by mouth daily.   polyethylene glycol packet Commonly known as:  MIRALAX / GLYCOLAX Take 17 g by mouth daily.   potassium chloride 10 MEQ tablet Commonly known as:  K-DUR Take 20 mEq by mouth daily. 2 tablets   rOPINIRole 4 MG tablet Commonly known as:  REQUIP Take 4 mg by mouth at bedtime.   SYSTANE BALANCE 0.6 % Soln Generic drug:  Propylene Glycol Place 1 drop into both eyes 2 (two) times daily.    torsemide 20 MG tablet Commonly known as:  DEMADEX Take 40 mg by mouth every morning. 2 tabs       No orders of the defined types were placed in this encounter.   Immunization History  Administered Date(s) Administered  . Influenza Split 04/15/2012  . Influenza,inj,Quad PF,36+ Mos 03/06/2014  . Influenza-Unspecified 03/01/2016  . PPD Test 08/30/2014, 09/18/2014  . Pneumococcal Polysaccharide-23 08/29/2013    Social History  Substance Use Topics  . Smoking status: Never Smoker  . Smokeless tobacco: Never Used  . Alcohol use 0.0 oz/week     Comment: rarely    Family history is   Family History  Problem Relation Age of Onset  . Dementia Mother   .  Stroke Father   . Pancreatic cancer Brother   . Anesthesia problems Neg Hx   . Colon cancer Neg Hx   . Esophageal cancer Neg Hx   . Stomach cancer Neg Hx   . Diabetes Neg Hx   . Kidney disease Neg Hx   . Liver disease Neg Hx       Review of Systems  DATA OBTAINED: from patient GENERAL:  no fevers, fatigue, appetite changes SKIN: No itching, or rash EYES: No eye pain, redness, discharge EARS: No earache, tinnitus, change in hearing NOSE: No congestion, drainage or bleeding  MOUTH/THROAT: No mouth or tooth pain, No sore throat RESPIRATORY: No cough, wheezing, SOB CARDIAC: No chest pain, palpitations, lower extremity edema  GI: No abdominal pain, No N/V/D or constipation, No heartburn or reflux  GU: No dysuria, frequency or urgency, or incontinence  MUSCULOSKELETAL: No unrelieved bone/joint pain NEUROLOGIC: No headache, dizziness or focal weakness PSYCHIATRIC: No c/o anxiety or sadness   Vitals:   08/18/16 0824  BP: (!) 146/78  Pulse: 78  Resp: 20  Temp: 97.8 F (36.6 C)    SpO2 Readings from Last 1 Encounters:  08/17/16 96%   Body mass index is 21.29 kg/m.     Physical Exam  GENERAL APPEARANCE: Alert, conversant,  No acute distress; [facility]  SKIN: No diaphoresis rash HEAD: Normocephalic,  atraumatic  EYES: Conjunctiva/lids clear. Pupils round, reactive. EOMs intact.  EARS: External exam WNL, canals clear. Hearing grossly normal.  NOSE: No deformity or discharge.  MOUTH/THROAT: Lips w/o lesions  RESPIRATORY: Breathing is even, unlabored. Lung sounds are clear   CARDIOVASCULAR: Heart RRR no murmurs, rubs or gallops. trace peripheral edema.   GASTROINTESTINAL: Abdomen is soft, non-tender, not distended w/ normal bowel sounds. GENITOURINARY: Bladder non tender, not distended  MUSCULOSKELETAL: No abnormal joints or musculature NEUROLOGIC:  Cranial nerves 2-12 grossly intact. Moves all extremities  PSYCHIATRIC: Mood and affect appropriate to situation mild dementia, no behavioral issues  Patient Active Problem List   Diagnosis Date Noted  . Pressure injury of skin 08/15/2016  . Aspiration pneumonia ([place]) 08/14/2016  . Chronic diastolic CHF (congestive heart failure) ([place]) 08/14/2016  . Chronic right shoulder pain 02/09/2016  . Bilateral lower extremity edema 02/02/2016  . Ileitis 01/19/2016  . Pressure ulcer 01/09/2016  . SBO (small bowel obstruction) 01/08/2016  . Narcotic addiction ([place]) 05/15/2015  . Diarrhea 03/30/2015  . Acute pharyngitis 02/26/2015  . Osteoarthritis of right knee 02/22/2015  . Pain in joint, lower leg 10/31/2014  . Ulcer of sacral region, stage 4 ([place]) 04/15/2014  . Moderate aortic stenosis 04/14/2014  . Pre-syncope 04/14/2014  . Hypotension 04/14/2014  . Acute renal failure superimposed on stage 2 chronic kidney disease ([place]) 04/14/2014  . Anemia 04/14/2014  . Protein-calorie malnutrition, severe ([place]) 04/14/2014  . Altered mental status   . Dehydration 04/13/2014  . Altered mental state 04/13/2014  . Acute kidney injury ([place])   . Hypokalemia 03/25/2014  . Syncope 03/16/2014  . Acute encephalopathy 03/16/2014  . Leg swelling 03/02/2014  . Avascular necrosis of bone of right hip ([place]) 09/27/2013  . Primary osteoarthritis of right hip  09/07/2013  . Spinal stenosis, lumbar region, with neurogenic claudication 09/07/2013  . Closed fracture of pubic ramus ([place]) 09/07/2013  . BPPV (benign paroxysmal positional vertigo) 09/07/2013  . Pharyngeal swelling 08/30/2013  . Pelvic fracture ([place]) 08/28/2013  . Fall 08/26/2013  . Dizziness 08/26/2013  . Abnormal stress test 08/26/2013  . Preop cardiovascular exam 08/01/2013  .  Aortic stenosis 08/01/2013  . Edema 06/30/2013  . Loss of weight 01/04/2013  . Situational stress 01/04/2013  . Chronic right hip pain 07/05/2012  . Knee pain, acute 06/29/2012  . Vertigo 06/24/2012  . Acute exacerbation of chronic low back pain 06/24/2012  . Lumbar pain with radiation down right leg 06/09/2012  . Osteoarthritis of hip 04/29/2012  . History of TIAs 04/17/2012  . S/P total hysterectomy and bilateral salpingo-oophorectomy 04/17/2012  . Hard of hearing 04/17/2012  . History of anemia 04/17/2012  . RLS (restless legs syndrome) 04/17/2012  . Chronic back pain 04/17/2012  . Spinal stenosis, lumbar 04/17/2012  . Lower back pain 12/20/2011  . Personal history of colonic polyps 10/08/2011  . H/O 09/14/2011  . Essential hypertension 09/14/2011  . Restless legs syndrome 09/14/2011  . Constipation 08/20/2011  . Urinary incontinence 08/20/2011      Labs reviewed: Basic Metabolic Panel:    Component Value Date/Time   NA 142 08/17/2016 0343   NA 142 08/17/2016   K 3.9 08/17/2016 0343   CL 106 08/17/2016 0343   CO2 28 08/17/2016 0343   GLUCOSE 92 08/17/2016 0343   BUN 22 (H) 08/17/2016 0343   BUN 22 (A) 08/17/2016   CREATININE 1.13 (H) 08/17/2016 0343   CREATININE 0.78 06/28/2014 1205   CALCIUM 7.8 (L) 08/17/2016 0343   PROT 6.6 08/14/2016 1519   ALBUMIN 3.4 (L) 08/14/2016 1519   AST 28 08/14/2016 1519   ALT 17 08/14/2016 1519   ALKPHOS 107 08/14/2016 1519   BILITOT 1.1 08/14/2016 1519   GFRNONAA 40 (L) 08/17/2016 0343   GFRAA 46 (L) 08/17/2016 0343     Recent Labs   01/10/16 0838  08/15/16 0531 08/16/16 08/16/16 0457 08/17/16 08/17/16 0343  NA 143  < > 140 147 147* 142 142  K 3.3*  < > 3.3* 3.1* 3.1*  --  3.9  CL 107  < > 94*  --  104  --  106  CO2 27  < > 34*  --  31  --  28  GLUCOSE 133*  < > 86  --  69  --  92  BUN 29*  < > 28* 23* 23* 22* 22*  CREATININE 0.71  < > 1.79* 1.2* 1.20* 1.1 1.13*  CALCIUM 8.8*  < > 8.0*  --  8.0*  --  7.8*  MG 2.0  --   --   --  2.2  --   --   < > = values in this interval not displayed. Liver Function Tests:  Recent Labs  01/08/16 0412 03/19/16 06/04/16 08/14/16 1519  AST 21 21 16 28   ALT 15 12 6* 17  ALKPHOS 109 104 109 107  BILITOT 0.5  --   --  1.1  PROT 7.6  --   --  6.6  ALBUMIN 4.4  --   --  3.4*    Recent Labs  01/08/16 0412  LIPASE 101*   No results for input(s): AMMONIA in the last 8760 hours. CBC:  Recent Labs  01/08/16 0412  08/14/16 1519 08/15/16 08/15/16 0531 08/16/16 08/16/16 0457  WBC 26.4*  < > 21.1* 16.1 16.1* 11.4 11.4*  NEUTROABS 24.0*  --  19.6*  --   --   --   --   HGB 12.2  < > 9.4* 8.5* 8.5*  --  8.3*  HCT 39.2  < > 31.3* 28* 28.2*  --  27.8*  MCV 86.5  < > 81.7  --  82.5  --  83.5  PLT 336  < > 300 256 256  --  261  < > = values in this interval not displayed. Lipid  Recent Labs  03/19/16  CHOL 141  HDL 55  LDLCALC 71  TRIG 77    Cardiac Enzymes: No results for input(s): CKTOTAL, CKMB, CKMBINDEX, TROPONINI in the last 8760 hours. BNP:  Recent Labs  08/15/16 0531  BNP 475.7*   No results found for: [facility] Lab Results  Component Value Date   HGBA1C 5.6 03/19/2016   Lab Results  Component Value Date   TSH 2.46 04/26/2016   Lab Results  Component Value Date   VITAMINB12 435 06/28/2014   Lab Results  Component Value Date   FOLATE 17.9 06/28/2014   Lab Results  Component Value Date   IRON 20 (L) 06/28/2014   TIBC 272 06/28/2014   FERRITIN 47 06/28/2014    Imaging and Procedures obtained prior to SNF admission: Ct Abdomen Pelvis Wo  Contrast  Result Date: 08/14/2016 CLINICAL DATA:  Nausea and vomiting. Abdominal pain after starting Phenergan. EXAM: CT ABDOMEN AND PELVIS WITHOUT CONTRAST TECHNIQUE: Multidetector CT imaging of the abdomen and pelvis was performed following the standard protocol without IV contrast. COMPARISON:  01/08/2016. FINDINGS: Lower chest: Patchy airspace opacity in the right middle lobe and right lower lobe. Mild left lower lobe atelectasis. No pleural fluid. Hepatobiliary: Mildly distended gallbladder containing sludge and a 9 mm gallstone. No gallbladder wall thickening or pericholecystic fluid seen. Unremarkable liver. Pancreas: Unremarkable. No pancreatic ductal dilatation or surrounding inflammatory changes. Spleen: Normal in size without focal abnormality. Adrenals/Urinary Tract: 2 tiny lower pole left renal calculi. Interval minimally dilated ureters and collecting systems. Distended urinary bladder. No urinary tract calculi seen. Stomach/Bowel: Progressive dilatation of the stomach and similar degree of dilatation of the proximal small bowel. Normal caliber distal small bowel with some fecalized bowel contents. No pneumatosis or wall thickening. No well-defined transition point. Inferior displacement of the rectum. Prominent stool throughout normal caliber colon. No evidence of appendicitis. Vascular/Lymphatic: Atheromatous arterial calcifications, including the abdominal aorta and dense coronary artery calcifications. No enlarged lymph nodes. Reproductive: Surgically absent uterus.  No adnexal masses. Other: Small amount of free peritoneal fluid. Mild diffuse subcutaneous edema. Musculoskeletal: Marked right hip degenerative changes with joint space narrowing and subarticular cyst formation. Lumbar and lower thoracic spine degenerative changes. IMPRESSION: 1. Recurrent partial small bowel obstruction. The cause of obstruction is not identified. Reactive ileus due to right lung pneumonia is also a possibility. 2.  Patchy airspace opacity in the right middle lobe and right lower lobe suspicious for pneumonia. 3. Rectocele. 4. 2 tiny, nonobstructing lower pole left renal calculi. 5. Interval minimal bilateral hydronephrosis, possibly mechanical due to mild distention of the urinary bladder. 6. Cholelithiasis and sludge in the gallbladder without evidence of cholecystitis. 7. Densely calcified coronary artery and aortic atherosclerosis. Electronically Signed   By: Claudie Revering M.D.   On: 08/14/2016 18:22   Dg Abd 2 Views  Result Date: 08/15/2016 CLINICAL DATA:  Check nasogastric catheter placement EXAM: ABDOMEN - 2 VIEW COMPARISON:  08/14/2016 FINDINGS: Nasogastric catheter is noted coiled within the fundus of the stomach stable from the prior exam. Scattered large and small bowel gas is again seen. The degree of small bowel dilatation has improved somewhat in the interval from the prior exam. Fecal material is noted throughout the colon. Degenerative changes of the lumbar spine and hip joints are noted. IMPRESSION: Nasogastric catheter within the stomach. The  degree of small bowel dilatation has improved in the interval. Electronically Signed   By: Inez Catalina M.D.   On: 08/15/2016 09:10   Dg Abd Portable 1v  Result Date: 08/14/2016 CLINICAL DATA:  NG tube placement EXAM: PORTABLE ABDOMEN - 1 VIEW COMPARISON:  08/14/2016 FINDINGS: Esophageal tube tip is folded upon itself in the gastric fundus. Multiple dilated loops of small bowel up to 5.7 cm, consistent with small bowel obstruction. IMPRESSION: Esophageal tube tip overlies the proximal stomach. Electronically Signed   By: Donavan Foil M.D.   On: 08/14/2016 20:49     Not all labs, radiology exams or other studies done during hospitalization come through on my EPIC note; however they are reviewed by me.    Assessment and Plan  SBO - Suspected due to adhesions. Nothing by mouth and NG tube placed which she poorly out overnight. Follow-up abdominal x-ray  this morning shows resolution of small bowel obstruction.Seen by SLP would recommend mild aspiration risk and okay with regular diet with thin liquids.Tolerating advanced diet. No further symptoms SNF - admitted for residential care  ASPIRATION PNA-emperic abx in hosp SNF - clina 300 mg TID for 7 days to flinish  ACUTE ON CKD2/HYPOKALEMIA- repleted AND RESOLVED WITH IVF SNF - will f/u BMP  HTN SNF - cont norvasc 5 mg daily and demadex 40 mg daily  CHRONIC dCHF- SNF - euvolemic; cont demadex 40 mg daily  UNSTAGEABLE PRESSURE ULCER R BUTTOCKS SNF - wound care will resume care    RLS SNF - controlled ; cont requip 4 mg qHS     Time spent > 8min;> 50% of time with patient was spent reviewing records, labs, tests and studies, counseling and developing plan of care  Webb Silversmith D. [name], MD

## 2016-08-18 NOTE — Telephone Encounter (Signed)
This is a patient of Shelly Martin, who was admitted to [facility] after hospitalization. [facility] F/U is needed. Hospital discharge from [facility] on 08/14/16.

## 2016-08-19 ENCOUNTER — Encounter: Payer: Self-pay | Admitting: Internal Medicine

## 2016-08-19 DIAGNOSIS — H9193 Unspecified hearing loss, bilateral: Secondary | ICD-10-CM | POA: Insufficient documentation

## 2016-08-19 HISTORY — DX: Unspecified hearing loss, bilateral: H91.93

## 2016-08-19 LAB — CULTURE, BLOOD (ROUTINE X 2)
CULTURE: NO GROWTH
Culture: NO GROWTH

## 2016-08-23 ENCOUNTER — Encounter: Payer: Self-pay | Admitting: Internal Medicine

## 2016-08-23 NOTE — Assessment & Plan Note (Signed)
Hb 8.3 which is a drop from prior;MCV is low will start iron FeSO4 325 mg

## 2016-08-23 NOTE — Progress Notes (Signed)
Location:  [facility] of Service:  SNF (31)SNF  Inocencio Homes, MD  Patient Care Team: [name], MD as PCP - General (Internal Medicine) [name], MD as Consulting Physician (Orthopedic Surgery) [name], MD as Consulting Physician (Otolaryngology)  Extended Emergency Contact Information Primary Emergency Contact: [name] Address: [place], [place] [address] [name] of [place] Phone: [phone] Relation: Daughter Secondary Emergency Contact: [place], [place] Mobile Phone: [phone] Relation: Son    Allergies: Amoxicillin; Baclofen; Hctz [hydrochlorothiazide]; and Valium [diazepam]  Chief Complaint  Patient presents with  . Acute Visit    HPI: Patient is 83 y.o. female who is being seen acutely for a cough for several days. Nursing noted he O2 sat is 89-90% on 2 L [place]. No reported fever. Cough not productive. No CP. Nothing makes it better or worse. Only other c/o is pt is thirsty.  Past Medical History:  Diagnosis Date  . Acute encephalopathy   . AKI (acute kidney injury) ([place])   . Altered mental state   . Anemia   . Aortic stenosis   . Arthritis    Back, knees  . Bowel incontinence   . Colon polyps   . Constipation   . Gastric ulcer    years ago  . Hypertension   . Hypokalemia   . Restless legs syndrome    on [place]  . Situational stress   . Spinal stenosis   . Stroke ([place])    Mini, no residual  . TIA (transient ischemic attack)   . Ulcer causing bleeding and hole in wall of stomach or small intestine   . Urinary bladder calculus   . Urinary incontinence   . Vertigo     Past Surgical History:  Procedure Laterality Date  . COLON RESECTION  50 years ago  . LUMBAR LAMINECTOMY/DECOMPRESSION MICRODISCECTOMY  06/25/2011   Procedure: LUMBAR LAMINECTOMY/DECOMPRESSION MICRODISCECTOMY;  Surgeon: Hosie Spangle,  MD;  Location: [facility];  Service: Neurosurgery;  Laterality: Right;  RIGHT Lumbar Two-Three hemilaminectomy and microdiskectomy  . TONSILLECTOMY    . UTERINE FIBROID SURGERY    . VAGINAL HYSTERECTOMY      Allergies as of 08/04/2016      Reactions   Amoxicillin Other (See Comments)   Reaction unknown   Baclofen Other (See Comments)   "fuzzy in the eyes" and dizzy   Hctz [hydrochlorothiazide]    nausea   Valium [diazepam] Other (See Comments)   Pt became unresponsive and O2 sats dropped.      Medication List       Accurate as of 08/04/16 11:59 PM. Always use your most recent med list.          acetaminophen 325 MG tablet Commonly known as:  TYLENOL Take 650 mg by mouth every 6 (six) hours as needed. Notify MD if not relieved. Not to exceed 3000 mg in 24 hour period   amLODipine 5 MG tablet Commonly known as:  NORVASC Take 5 mg by mouth daily.   CHLORASEPTIC MAX SORE THROAT 1.5-33 % Liqd Generic drug:  Phenol-Glycerin Use as directed 1 spray in the mouth or throat 3 (three) times daily as needed. For throat irritation/pain. If sore throat persist notify the MD   feeding supplement Liqd Take 1 Container by mouth 2 (two) times daily between  meals.   lubiprostone 24 MCG capsule Commonly known as:  AMITIZA Take 24 mcg by mouth 2 (two) times daily with a meal.   magnesium hydroxide 400 MG/5ML suspension Commonly known as:  MILK OF MAGNESIA Take 30 mLs by mouth daily as needed for mild constipation. Reported on 07/27/2015   mirtazapine 15 MG tablet Commonly known as:  REMERON Take 15 mg by mouth at bedtime.   multivitamin tablet Take 1 tablet by mouth daily.   MYRBETRIQ 50 MG Tb24 tablet Generic drug:  mirabegron ER Take 50 mg by mouth daily.   ondansetron 4 MG tablet Commonly known as:  ZOFRAN Take 4 mg by mouth every 12 (twelve) hours as needed for nausea or vomiting.   pantoprazole 40 MG tablet Commonly known as:  PROTONIX Take 40 mg by mouth daily.     potassium chloride 10 MEQ tablet Commonly known as:  K-DUR Take 20 mEq by mouth daily. 2 tablets   rOPINIRole 4 MG tablet Commonly known as:  REQUIP Take 4 mg by mouth at bedtime.   SYSTANE BALANCE 0.6 % Soln Generic drug:  Propylene Glycol Place 1 drop into both eyes 2 (two) times daily.   torsemide 20 MG tablet Commonly known as:  DEMADEX Take 40 mg by mouth every morning. 2 tabs       No orders of the defined types were placed in this encounter.   Immunization History  Administered Date(s) Administered  . Influenza Split 04/15/2012  . Influenza,inj,Quad PF,36+ Mos 03/06/2014  . Influenza-Unspecified 03/01/2016  . PPD Test 08/30/2014, 09/18/2014  . Pneumococcal Polysaccharide-23 08/29/2013    Social History  Substance Use Topics  . Smoking status: Never Smoker  . Smokeless tobacco: Never Used  . Alcohol use 0.0 oz/week     Comment: rarely    Review of Systems  DATA OBTAINED: from patient, nurse GENERAL:  no fevers,+ fatigue, appetite changes SKIN: No itching, rash HEENT: No complaint RESPIRATORY: + cough, -wheezing, some SOB CARDIAC: No chest pain, palpitations, lower extremity edema  GI: No abdominal pain, No N/V/D or constipation, No heartburn or reflux  GU: No dysuria, frequency or urgency, or incontinence  MUSCULOSKELETAL: No unrelieved bone/joint pain NEUROLOGIC: No headache, dizziness  PSYCHIATRIC: No overt anxiety or sadness  Vitals:   08/23/16 1116  BP: (!) 105/58  Pulse: 66  Resp: 18  Temp: 97.9 F (36.6 C)   There is no height or weight on file to calculate BMI. Physical Exam  GENERAL APPEARANCE: Alert, conversant, No acute distress  SKIN: No diaphoresis rash HEENT: Unremarkable RESPIRATORY: Breathing is even, unlabored. Lung sounds are slt rhonchi   CARDIOVASCULAR: Heart RRR no murmurs, rubs or gallops. trace peripheral edema  GASTROINTESTINAL: Abdomen is soft, non-tender, not distended w/ normal bowel sounds.  GENITOURINARY:  Bladder non tender, not distended  MUSCULOSKELETAL: No abnormal joints or musculature NEUROLOGIC: Cranial nerves 2-12 grossly intact. Moves all extremities PSYCHIATRIC: Mood and affect appropriate to situation with some dementia, no behavioral issues  Patient Active Problem List   Diagnosis Date Noted  . Pressure injury of skin 08/15/2016  . Aspiration pneumonia ([place]) 08/14/2016  . Chronic diastolic CHF (congestive heart failure) ([place]) 08/14/2016  . Chronic right shoulder pain 02/09/2016  . Bilateral lower extremity edema 02/02/2016  . Ileitis 01/19/2016  . Pressure ulcer 01/09/2016  . SBO (small bowel obstruction) 01/08/2016  . Narcotic addiction ([place]) 05/15/2015  . Diarrhea 03/30/2015  . Acute pharyngitis 02/26/2015  . Osteoarthritis of right knee 02/22/2015  . Pain in joint,  lower leg 10/31/2014  . Ulcer of sacral region, stage 4 ([place]) 04/15/2014  . Moderate aortic stenosis 04/14/2014  . Pre-syncope 04/14/2014  . Hypotension 04/14/2014  . Acute renal failure superimposed on stage 2 chronic kidney disease ([place]) 04/14/2014  . Anemia 04/14/2014  . Protein-calorie malnutrition, severe ([place]) 04/14/2014  . Altered mental status   . Dehydration 04/13/2014  . Altered mental state 04/13/2014  . Acute kidney injury ([place])   . Hypokalemia 03/25/2014  . Syncope 03/16/2014  . Acute encephalopathy 03/16/2014  . Leg swelling 03/02/2014  . Avascular necrosis of bone of right hip ([place]) 09/27/2013  . Primary osteoarthritis of right hip 09/07/2013  . Spinal stenosis, lumbar region, with neurogenic claudication 09/07/2013  . Closed fracture of pubic ramus ([place]) 09/07/2013  . BPPV (benign paroxysmal positional vertigo) 09/07/2013  . Pharyngeal swelling 08/30/2013  . Pelvic fracture ([place]) 08/28/2013  . Fall 08/26/2013  . Dizziness 08/26/2013  . Abnormal stress test 08/26/2013  . Preop cardiovascular exam 08/01/2013  . Aortic stenosis 08/01/2013  . Edema 06/30/2013  . Loss of weight  01/04/2013  . Situational stress 01/04/2013  . Chronic right hip pain 07/05/2012  . Knee pain, acute 06/29/2012  . Vertigo 06/24/2012  . Acute exacerbation of chronic low back pain 06/24/2012  . Lumbar pain with radiation down right leg 06/09/2012  . Osteoarthritis of hip 04/29/2012  . History of TIAs 04/17/2012  . S/P total hysterectomy and bilateral salpingo-oophorectomy 04/17/2012  . Hard of hearing 04/17/2012  . History of anemia 04/17/2012  . RLS (restless legs syndrome) 04/17/2012  . Chronic back pain 04/17/2012  . Spinal stenosis, lumbar 04/17/2012  . Lower back pain 12/20/2011  . Personal history of colonic polyps 10/08/2011  . H/O 09/14/2011  . Essential hypertension 09/14/2011  . Restless legs syndrome 09/14/2011  . Constipation 08/20/2011  . Urinary incontinence 08/20/2011    CMP     Component Value Date/Time   NA 142 08/17/2016 0343   NA 142 08/17/2016   K 3.9 08/17/2016 0343   CL 106 08/17/2016 0343   CO2 28 08/17/2016 0343   GLUCOSE 92 08/17/2016 0343   BUN 22 (H) 08/17/2016 0343   BUN 22 (A) 08/17/2016   CREATININE 1.13 (H) 08/17/2016 0343   CREATININE 0.78 06/28/2014 1205   CALCIUM 7.8 (L) 08/17/2016 0343   PROT 6.6 08/14/2016 1519   ALBUMIN 3.4 (L) 08/14/2016 1519   AST 28 08/14/2016 1519   ALT 17 08/14/2016 1519   ALKPHOS 107 08/14/2016 1519   BILITOT 1.1 08/14/2016 1519   GFRNONAA 40 (L) 08/17/2016 0343   GFRAA 46 (L) 08/17/2016 0343    Recent Labs  01/10/16 0838  08/15/16 0531 08/16/16 08/16/16 0457 08/17/16 08/17/16 0343  NA 143  < > 140 147 147* 142 142  K 3.3*  < > 3.3* 3.1* 3.1*  --  3.9  CL 107  < > 94*  --  104  --  106  CO2 27  < > 34*  --  31  --  28  GLUCOSE 133*  < > 86  --  69  --  92  BUN 29*  < > 28* 23* 23* 22* 22*  CREATININE 0.71  < > 1.79* 1.2* 1.20* 1.1 1.13*  CALCIUM 8.8*  < > 8.0*  --  8.0*  --  7.8*  MG 2.0  --   --   --  2.2  --   --   < > = values in this interval not displayed.  Recent Labs  01/08/16 0412  03/19/16 06/04/16 08/14/16 1519  AST 21 21 16 28   ALT 15 12 6* 17  ALKPHOS 109 104 109 107  BILITOT 0.5  --   --  1.1  PROT 7.6  --   --  6.6  ALBUMIN 4.4  --   --  3.4*    Recent Labs  01/08/16 0412  08/14/16 1519 08/15/16 08/15/16 0531 08/16/16 08/16/16 0457  WBC 26.4*  < > 21.1* 16.1 16.1* 11.4 11.4*  NEUTROABS 24.0*  --  19.6*  --   --   --   --   HGB 12.2  < > 9.4* 8.5* 8.5*  --  8.3*  HCT 39.2  < > 31.3* 28* 28.2*  --  27.8*  MCV 86.5  < > 81.7  --  82.5  --  83.5  PLT 336  < > 300 256 256  --  261  < > = values in this interval not displayed.  Recent Labs  03/19/16  CHOL 141  LDLCALC 71  TRIG 77   No results found for: [facility] Lab Results  Component Value Date   TSH 2.46 04/26/2016   Lab Results  Component Value Date   HGBA1C 5.6 03/19/2016   Lab Results  Component Value Date   CHOL 141 03/19/2016   HDL 55 03/19/2016   LDLCALC 71 03/19/2016   TRIG 77 03/19/2016   CHOLHDL 2.8 03/06/2014    Significant Diagnostic Results in last 30 days:  Ct Abdomen Pelvis Wo Contrast  Result Date: 08/14/2016 CLINICAL DATA:  Nausea and vomiting. Abdominal pain after starting Phenergan. EXAM: CT ABDOMEN AND PELVIS WITHOUT CONTRAST TECHNIQUE: Multidetector CT imaging of the abdomen and pelvis was performed following the standard protocol without IV contrast. COMPARISON:  01/08/2016. FINDINGS: Lower chest: Patchy airspace opacity in the right middle lobe and right lower lobe. Mild left lower lobe atelectasis. No pleural fluid. Hepatobiliary: Mildly distended gallbladder containing sludge and a 9 mm gallstone. No gallbladder wall thickening or pericholecystic fluid seen. Unremarkable liver. Pancreas: Unremarkable. No pancreatic ductal dilatation or surrounding inflammatory changes. Spleen: Normal in size without focal abnormality. Adrenals/Urinary Tract: 2 tiny lower pole left renal calculi. Interval minimally dilated ureters and collecting systems. Distended urinary bladder. No  urinary tract calculi seen. Stomach/Bowel: Progressive dilatation of the stomach and similar degree of dilatation of the proximal small bowel. Normal caliber distal small bowel with some fecalized bowel contents. No pneumatosis or wall thickening. No well-defined transition point. Inferior displacement of the rectum. Prominent stool throughout normal caliber colon. No evidence of appendicitis. Vascular/Lymphatic: Atheromatous arterial calcifications, including the abdominal aorta and dense coronary artery calcifications. No enlarged lymph nodes. Reproductive: Surgically absent uterus.  No adnexal masses. Other: Small amount of free peritoneal fluid. Mild diffuse subcutaneous edema. Musculoskeletal: Marked right hip degenerative changes with joint space narrowing and subarticular cyst formation. Lumbar and lower thoracic spine degenerative changes. IMPRESSION: 1. Recurrent partial small bowel obstruction. The cause of obstruction is not identified. Reactive ileus due to right lung pneumonia is also a possibility. 2. Patchy airspace opacity in the right middle lobe and right lower lobe suspicious for pneumonia. 3. Rectocele. 4. 2 tiny, nonobstructing lower pole left renal calculi. 5. Interval minimal bilateral hydronephrosis, possibly mechanical due to mild distention of the urinary bladder. 6. Cholelithiasis and sludge in the gallbladder without evidence of cholecystitis. 7. Densely calcified coronary artery and aortic atherosclerosis. Electronically Signed   By: Claudie Revering M.D.   On: 08/14/2016 18:22  Dg Abd 2 Views  Result Date: 08/15/2016 CLINICAL DATA:  Check nasogastric catheter placement EXAM: ABDOMEN - 2 VIEW COMPARISON:  08/14/2016 FINDINGS: Nasogastric catheter is noted coiled within the fundus of the stomach stable from the prior exam. Scattered large and small bowel gas is again seen. The degree of small bowel dilatation has improved somewhat in the interval from the prior exam. Fecal material is  noted throughout the colon. Degenerative changes of the lumbar spine and hip joints are noted. IMPRESSION: Nasogastric catheter within the stomach. The degree of small bowel dilatation has improved in the interval. Electronically Signed   By: Inez Catalina M.D.   On: 08/15/2016 09:10   Dg Abd Portable 1v-small Bowel Protocol-position Verification  Result Date: 08/16/2016 CLINICAL DATA:  Assess for small bowel obstruction. Initial encounter. EXAM: PORTABLE ABDOMEN - 1 VIEW COMPARISON:  Abdominal radiograph from 08/15/2016 FINDINGS: Contrast is seen filling the colon. There is no evidence for bowel obstruction. The visualized bowel gas pattern is grossly unremarkable. No free intra-abdominal air is identified, though evaluation for free air is limited on a single supine view. No acute osseous abnormalities are seen. Degenerative change is noted at the right hip joint. IMPRESSION: Contrast seen filling the colon. No evidence for bowel obstruction. No free intra-abdominal air seen. Electronically Signed   By: Garald Balding M.D.   On: 08/16/2016 02:20   Dg Abd Portable 1v  Result Date: 08/14/2016 CLINICAL DATA:  NG tube placement EXAM: PORTABLE ABDOMEN - 1 VIEW COMPARISON:  08/14/2016 FINDINGS: Esophageal tube tip is folded upon itself in the gastric fundus. Multiple dilated loops of small bowel up to 5.7 cm, consistent with small bowel obstruction. IMPRESSION: Esophageal tube tip overlies the proximal stomach. Electronically Signed   By: Donavan Foil M.D.   On: 08/14/2016 20:49   Dg Hip Unilat With Pelvis 1v Right  Result Date: 08/16/2016 CLINICAL DATA:  Pain and limited range of motion EXAM: DG HIP (WITH OR WITHOUT PELVIS) RIGHT:  2 V COMPARISON:  None. FINDINGS: Frontal pelvis as well as lateral right hip images were obtained. Bones are osteoporotic. No acute fracture or dislocation is evident. There is moderate narrowing of both hip joints. There is subtle flattening of the femoral head on the right,  likely due to a degree of underlying avascular necrosis in the right femoral head. There is contrast in portions of small bowel and colon. Postoperative changes noted in the pelvis. IMPRESSION: Osteoarthritic change in both hip joints, more severe on the right. Suspect a degree of avascular necrosis in the right femoral head. Bones are diffusely osteoporotic. No acute fracture or dislocation. Electronically Signed   By: Lowella Grip III M.D.   On: 08/16/2016 10:35    Assessment and Plan  COUGH/ OXYGEN DESATURATION- have ordered flu test, CXR, CMP and CBC; cont O2 2L [place] and supportive care    Inocencio Homes, MD

## 2016-08-23 NOTE — Progress Notes (Signed)
Location:      Place of Service:     Inocencio Homes, MD  Patient Care Team: [name], MD as PCP - General (Internal Medicine) [name], MD as Consulting Physician (Orthopedic Surgery) [name], MD as Consulting Physician (Otolaryngology)  Extended Emergency Contact Information Primary Emergency Contact: [name] Address: [place], [place] [address] [name] of [place] Phone: [phone] Relation: Daughter Secondary Emergency Contact: Yarnell, [place] of [place] Mobile Phone: [phone] Relation: Son    Allergies: Amoxicillin; Baclofen; Hctz [hydrochlorothiazide]; and Valium [diazepam]  No chief complaint on file.   HPI: Patient is 83 y.o. female who   Past Medical History:  Diagnosis Date  . Acute encephalopathy   . AKI (acute kidney injury) ([place])   . Altered mental state   . Anemia   . Aortic stenosis   . Arthritis    Back, knees  . Bowel incontinence   . Colon polyps   . Constipation   . Gastric ulcer    years ago  . Hypertension   . Hypokalemia   . Restless legs syndrome    on Requip  . Situational stress   . Spinal stenosis   . Stroke ([place])    Mini, no residual  . TIA (transient ischemic attack)   . Ulcer causing bleeding and hole in wall of stomach or small intestine   . Urinary bladder calculus   . Urinary incontinence   . Vertigo     Past Surgical History:  Procedure Laterality Date  . COLON RESECTION  50 years ago  . LUMBAR LAMINECTOMY/DECOMPRESSION MICRODISCECTOMY  06/25/2011   Procedure: LUMBAR LAMINECTOMY/DECOMPRESSION MICRODISCECTOMY;  Surgeon: Hosie Spangle, MD;  Location: [facility];  Service: Neurosurgery;  Laterality: Right;  RIGHT Lumbar Two-Three hemilaminectomy and microdiskectomy  . TONSILLECTOMY    . UTERINE FIBROID SURGERY    . VAGINAL HYSTERECTOMY      Allergies as of 08/23/2016      Reactions   Amoxicillin Other (See  Comments)   Reaction unknown   Baclofen Other (See Comments)   "fuzzy in the eyes" and dizzy   Hctz [hydrochlorothiazide]    nausea   Valium [diazepam] Other (See Comments)   Pt became unresponsive and O2 sats dropped.      Medication List       Accurate as of 08/23/16 11:54 AM. Always use your most recent med list.          acetaminophen 325 MG tablet Commonly known as:  TYLENOL Take 650 mg by mouth every 6 (six) hours as needed. Notify MD if not relieved. Not to exceed 3000 mg in 24 hour period   amLODipine 5 MG tablet Commonly known as:  NORVASC Take 5 mg by mouth daily.   CHLORASEPTIC MAX SORE THROAT 1.5-33 % Liqd Generic drug:  Phenol-Glycerin Use as directed 1 spray in the mouth or throat 3 (three) times daily as needed. For throat irritation/pain. If sore throat persist notify the MD   feeding supplement Liqd Take 1 Container by mouth 2 (two) times daily between meals.   lubiprostone 24 MCG capsule Commonly known as:  AMITIZA Take 24 mcg by mouth 2 (two) times daily with a meal.   magnesium hydroxide 400 MG/5ML suspension Commonly known as:  MILK OF MAGNESIA Take 30 mLs by mouth daily as needed for mild constipation. Reported on  07/27/2015   mirtazapine 15 MG tablet Commonly known as:  REMERON Take 15 mg by mouth at bedtime.   morphine 15 MG 12 hr tablet Commonly known as:  MS CONTIN Take 1 tablet (15 mg total) by mouth 2 (two) times daily.   multivitamin tablet Take 1 tablet by mouth daily.   MYRBETRIQ 50 MG Tb24 tablet Generic drug:  mirabegron ER Take 50 mg by mouth daily.   ondansetron 4 MG tablet Commonly known as:  ZOFRAN Take 4 mg by mouth every 12 (twelve) hours as needed for nausea or vomiting.   pantoprazole 40 MG tablet Commonly known as:  PROTONIX Take 40 mg by mouth daily.   polyethylene glycol packet Commonly known as:  MIRALAX / GLYCOLAX Take 17 g by mouth daily.   potassium chloride 10 MEQ tablet Commonly known as:  K-DUR Take  20 mEq by mouth daily. 2 tablets   rOPINIRole 4 MG tablet Commonly known as:  REQUIP Take 4 mg by mouth at bedtime.   SYSTANE BALANCE 0.6 % Soln Generic drug:  Propylene Glycol Place 1 drop into both eyes 2 (two) times daily.   torsemide 20 MG tablet Commonly known as:  DEMADEX Take 40 mg by mouth every morning. 2 tabs       No orders of the defined types were placed in this encounter.   Immunization History  Administered Date(s) Administered  . Influenza Split 04/15/2012  . Influenza,inj,Quad PF,36+ Mos 03/06/2014  . Influenza-Unspecified 03/01/2016  . PPD Test 08/30/2014, 09/18/2014  . Pneumococcal Polysaccharide-23 08/29/2013    Social History  Substance Use Topics  . Smoking status: Never Smoker  . Smokeless tobacco: Never Used  . Alcohol use 0.0 oz/week     Comment: rarely    Review of Systems  DATA OBTAINED: from patient, nurse, medical record, family member GENERAL:  no fevers, fatigue, appetite changes SKIN: No itching, rash HEENT: No complaint RESPIRATORY: No cough, wheezing, SOB CARDIAC: No chest pain, palpitations, lower extremity edema  GI: No abdominal pain, No N/V/D or constipation, No heartburn or reflux  GU: No dysuria, frequency or urgency, or incontinence  MUSCULOSKELETAL: No unrelieved bone/joint pain NEUROLOGIC: No headache, dizziness  PSYCHIATRIC: No overt anxiety or sadness  There were no vitals filed for this visit. There is no height or weight on file to calculate BMI. Physical Exam  GENERAL APPEARANCE: Alert, conversant, No acute distress  SKIN: No diaphoresis rash HEENT: Unremarkable RESPIRATORY: Breathing is even, unlabored. Lung sounds are clear   CARDIOVASCULAR: Heart RRR no murmurs, rubs or gallops. No peripheral edema  GASTROINTESTINAL: Abdomen is soft, non-tender, not distended w/ normal bowel sounds.  GENITOURINARY: Bladder non tender, not distended  MUSCULOSKELETAL: No abnormal joints or musculature NEUROLOGIC: Cranial  nerves 2-12 grossly intact. Moves all extremities PSYCHIATRIC: Mood and affect appropriate to situation, no behavioral issues  Patient Active Problem List   Diagnosis Date Noted  . Pressure injury of skin 08/15/2016  . Aspiration pneumonia ([place]) 08/14/2016  . Chronic diastolic CHF (congestive heart failure) ([place]) 08/14/2016  . Chronic right shoulder pain 02/09/2016  . Bilateral lower extremity edema 02/02/2016  . Ileitis 01/19/2016  . Pressure ulcer 01/09/2016  . SBO (small bowel obstruction) 01/08/2016  . Narcotic addiction ([place]) 05/15/2015  . Diarrhea 03/30/2015  . Acute pharyngitis 02/26/2015  . Osteoarthritis of right knee 02/22/2015  . Pain in joint, lower leg 10/31/2014  . Ulcer of sacral region, stage 4 ([place]) 04/15/2014  . Moderate aortic stenosis 04/14/2014  . Pre-syncope 04/14/2014  .  Hypotension 04/14/2014  . Acute renal failure superimposed on stage 2 chronic kidney disease ([place]) 04/14/2014  . Anemia 04/14/2014  . Protein-calorie malnutrition, severe ([place]) 04/14/2014  . Altered mental status   . Dehydration 04/13/2014  . Altered mental state 04/13/2014  . Acute kidney injury ([place])   . Hypokalemia 03/25/2014  . Syncope 03/16/2014  . Acute encephalopathy 03/16/2014  . Leg swelling 03/02/2014  . Avascular necrosis of bone of right hip ([place]) 09/27/2013  . Primary osteoarthritis of right hip 09/07/2013  . Spinal stenosis, lumbar region, with neurogenic claudication 09/07/2013  . Closed fracture of pubic ramus ([place]) 09/07/2013  . BPPV (benign paroxysmal positional vertigo) 09/07/2013  . Pharyngeal swelling 08/30/2013  . Pelvic fracture ([place]) 08/28/2013  . Fall 08/26/2013  . Dizziness 08/26/2013  . Abnormal stress test 08/26/2013  . Preop cardiovascular exam 08/01/2013  . Aortic stenosis 08/01/2013  . Edema 06/30/2013  . Loss of weight 01/04/2013  . Situational stress 01/04/2013  . Chronic right hip pain 07/05/2012  . Knee pain, acute 06/29/2012  . Vertigo  06/24/2012  . Acute exacerbation of chronic low back pain 06/24/2012  . Lumbar pain with radiation down right leg 06/09/2012  . Osteoarthritis of hip 04/29/2012  . History of TIAs 04/17/2012  . S/P total hysterectomy and bilateral salpingo-oophorectomy 04/17/2012  . Hard of hearing 04/17/2012  . History of anemia 04/17/2012  . RLS (restless legs syndrome) 04/17/2012  . Chronic back pain 04/17/2012  . Spinal stenosis, lumbar 04/17/2012  . Lower back pain 12/20/2011  . Personal history of colonic polyps 10/08/2011  . H/O 09/14/2011  . Essential hypertension 09/14/2011  . Restless legs syndrome 09/14/2011  . Constipation 08/20/2011  . Urinary incontinence 08/20/2011    CMP     Component Value Date/Time   NA 142 08/17/2016 0343   NA 142 08/17/2016   K 3.9 08/17/2016 0343   CL 106 08/17/2016 0343   CO2 28 08/17/2016 0343   GLUCOSE 92 08/17/2016 0343   BUN 22 (H) 08/17/2016 0343   BUN 22 (A) 08/17/2016   CREATININE 1.13 (H) 08/17/2016 0343   CREATININE 0.78 06/28/2014 1205   CALCIUM 7.8 (L) 08/17/2016 0343   PROT 6.6 08/14/2016 1519   ALBUMIN 3.4 (L) 08/14/2016 1519   AST 28 08/14/2016 1519   ALT 17 08/14/2016 1519   ALKPHOS 107 08/14/2016 1519   BILITOT 1.1 08/14/2016 1519   GFRNONAA 40 (L) 08/17/2016 0343   GFRAA 46 (L) 08/17/2016 0343    Recent Labs  01/10/16 0838  08/15/16 0531 08/16/16 08/16/16 0457 08/17/16 08/17/16 0343  NA 143  < > 140 147 147* 142 142  K 3.3*  < > 3.3* 3.1* 3.1*  --  3.9  CL 107  < > 94*  --  104  --  106  CO2 27  < > 34*  --  31  --  28  GLUCOSE 133*  < > 86  --  69  --  92  BUN 29*  < > 28* 23* 23* 22* 22*  CREATININE 0.71  < > 1.79* 1.2* 1.20* 1.1 1.13*  CALCIUM 8.8*  < > 8.0*  --  8.0*  --  7.8*  MG 2.0  --   --   --  2.2  --   --   < > = values in this interval not displayed.  Recent Labs  01/08/16 0412 03/19/16 06/04/16 08/14/16 1519  AST 21 21 16 28   ALT 15 12 6* 17  ALKPHOS 109  104 109 107  BILITOT 0.5  --   --  1.1    PROT 7.6  --   --  6.6  ALBUMIN 4.4  --   --  3.4*    Recent Labs  01/08/16 0412  08/14/16 1519 08/15/16 08/15/16 0531 08/16/16 08/16/16 0457  WBC 26.4*  < > 21.1* 16.1 16.1* 11.4 11.4*  NEUTROABS 24.0*  --  19.6*  --   --   --   --   HGB 12.2  < > 9.4* 8.5* 8.5*  --  8.3*  HCT 39.2  < > 31.3* 28* 28.2*  --  27.8*  MCV 86.5  < > 81.7  --  82.5  --  83.5  PLT 336  < > 300 256 256  --  261  < > = values in this interval not displayed.  Recent Labs  03/19/16  CHOL 141  LDLCALC 71  TRIG 77   No results found for: [facility] Lab Results  Component Value Date   TSH 2.46 04/26/2016   Lab Results  Component Value Date   HGBA1C 5.6 03/19/2016   Lab Results  Component Value Date   CHOL 141 03/19/2016   HDL 55 03/19/2016   LDLCALC 71 03/19/2016   TRIG 77 03/19/2016   CHOLHDL 2.8 03/06/2014    Significant Diagnostic Results in last 30 days:  Ct Abdomen Pelvis Wo Contrast  Result Date: 08/14/2016 CLINICAL DATA:  Nausea and vomiting. Abdominal pain after starting Phenergan. EXAM: CT ABDOMEN AND PELVIS WITHOUT CONTRAST TECHNIQUE: Multidetector CT imaging of the abdomen and pelvis was performed following the standard protocol without IV contrast. COMPARISON:  01/08/2016. FINDINGS: Lower chest: Patchy airspace opacity in the right middle lobe and right lower lobe. Mild left lower lobe atelectasis. No pleural fluid. Hepatobiliary: Mildly distended gallbladder containing sludge and a 9 mm gallstone. No gallbladder wall thickening or pericholecystic fluid seen. Unremarkable liver. Pancreas: Unremarkable. No pancreatic ductal dilatation or surrounding inflammatory changes. Spleen: Normal in size without focal abnormality. Adrenals/Urinary Tract: 2 tiny lower pole left renal calculi. Interval minimally dilated ureters and collecting systems. Distended urinary bladder. No urinary tract calculi seen. Stomach/Bowel: Progressive dilatation of the stomach and similar degree of dilatation of the  proximal small bowel. Normal caliber distal small bowel with some fecalized bowel contents. No pneumatosis or wall thickening. No well-defined transition point. Inferior displacement of the rectum. Prominent stool throughout normal caliber colon. No evidence of appendicitis. Vascular/Lymphatic: Atheromatous arterial calcifications, including the abdominal aorta and dense coronary artery calcifications. No enlarged lymph nodes. Reproductive: Surgically absent uterus.  No adnexal masses. Other: Small amount of free peritoneal fluid. Mild diffuse subcutaneous edema. Musculoskeletal: Marked right hip degenerative changes with joint space narrowing and subarticular cyst formation. Lumbar and lower thoracic spine degenerative changes. IMPRESSION: 1. Recurrent partial small bowel obstruction. The cause of obstruction is not identified. Reactive ileus due to right lung pneumonia is also a possibility. 2. Patchy airspace opacity in the right middle lobe and right lower lobe suspicious for pneumonia. 3. Rectocele. 4. 2 tiny, nonobstructing lower pole left renal calculi. 5. Interval minimal bilateral hydronephrosis, possibly mechanical due to mild distention of the urinary bladder. 6. Cholelithiasis and sludge in the gallbladder without evidence of cholecystitis. 7. Densely calcified coronary artery and aortic atherosclerosis. Electronically Signed   By: Claudie Revering M.D.   On: 08/14/2016 18:22   Dg Abd 2 Views  Result Date: 08/15/2016 CLINICAL DATA:  Check nasogastric catheter placement EXAM: ABDOMEN - 2 VIEW COMPARISON:  08/14/2016 FINDINGS: Nasogastric catheter is noted coiled within the fundus of the stomach stable from the prior exam. Scattered large and small bowel gas is again seen. The degree of small bowel dilatation has improved somewhat in the interval from the prior exam. Fecal material is noted throughout the colon. Degenerative changes of the lumbar spine and hip joints are noted. IMPRESSION: Nasogastric  catheter within the stomach. The degree of small bowel dilatation has improved in the interval. Electronically Signed   By: Inez Catalina M.D.   On: 08/15/2016 09:10   Dg Abd Portable 1v-small Bowel Protocol-position Verification  Result Date: 08/16/2016 CLINICAL DATA:  Assess for small bowel obstruction. Initial encounter. EXAM: PORTABLE ABDOMEN - 1 VIEW COMPARISON:  Abdominal radiograph from 08/15/2016 FINDINGS: Contrast is seen filling the colon. There is no evidence for bowel obstruction. The visualized bowel gas pattern is grossly unremarkable. No free intra-abdominal air is identified, though evaluation for free air is limited on a single supine view. No acute osseous abnormalities are seen. Degenerative change is noted at the right hip joint. IMPRESSION: Contrast seen filling the colon. No evidence for bowel obstruction. No free intra-abdominal air seen. Electronically Signed   By: Garald Balding M.D.   On: 08/16/2016 02:20   Dg Abd Portable 1v  Result Date: 08/14/2016 CLINICAL DATA:  NG tube placement EXAM: PORTABLE ABDOMEN - 1 VIEW COMPARISON:  08/14/2016 FINDINGS: Esophageal tube tip is folded upon itself in the gastric fundus. Multiple dilated loops of small bowel up to 5.7 cm, consistent with small bowel obstruction. IMPRESSION: Esophageal tube tip overlies the proximal stomach. Electronically Signed   By: Donavan Foil M.D.   On: 08/14/2016 20:49   Dg Hip Unilat With Pelvis 1v Right  Result Date: 08/16/2016 CLINICAL DATA:  Pain and limited range of motion EXAM: DG HIP (WITH OR WITHOUT PELVIS) RIGHT:  2 V COMPARISON:  None. FINDINGS: Frontal pelvis as well as lateral right hip images were obtained. Bones are osteoporotic. No acute fracture or dislocation is evident. There is moderate narrowing of both hip joints. There is subtle flattening of the femoral head on the right, likely due to a degree of underlying avascular necrosis in the right femoral head. There is contrast in portions of small  bowel and colon. Postoperative changes noted in the pelvis. IMPRESSION: Osteoarthritic change in both hip joints, more severe on the right. Suspect a degree of avascular necrosis in the right femoral head. Bones are diffusely osteoporotic. No acute fracture or dislocation. Electronically Signed   By: Lowella Grip III M.D.   On: 08/16/2016 10:35    Assessment and Plan  No problem-specific Assessment & Plan notes found for this encounter.   Labs/tests ordered:    Inocencio Homes, MD    This encounter was created in error - please disregard.

## 2016-08-23 NOTE — Assessment & Plan Note (Signed)
Does seem to respond to PT; pt using chronic pain meds for hip which covers shoulder as well;; cont MS Contin 15 mg q12 with norco 5X daily prn

## 2016-08-23 NOTE — Assessment & Plan Note (Signed)
Chronic pain from avascular necrosis; pt is on pain regimen from pain clinic; MS contin 15 mg BID and prn norco 5x daily;plan to cont current regimen

## 2016-08-28 LAB — UREA NITROGEN, URINE

## 2016-09-04 LAB — CBC AND DIFFERENTIAL
HCT: 29 % — AB (ref 36–46)
Hemoglobin: 8.6 g/dL — AB (ref 12.0–16.0)
Platelets: 311 10*3/uL (ref 150–399)
WBC: 6.5 10^3/mL

## 2016-09-08 LAB — CBC AND DIFFERENTIAL
HCT: 26 % — AB (ref 36–46)
Hemoglobin: 8.4 g/dL — AB (ref 12.0–16.0)
PLATELETS: 299 10*3/uL (ref 150–399)
WBC: 6.7 10*3/mL

## 2016-09-16 ENCOUNTER — Encounter: Payer: Self-pay | Admitting: Internal Medicine

## 2016-09-16 ENCOUNTER — Non-Acute Institutional Stay (SKILLED_NURSING_FACILITY): Payer: Medicare Other | Admitting: Internal Medicine

## 2016-09-16 DIAGNOSIS — G2581 Restless legs syndrome: Secondary | ICD-10-CM

## 2016-09-16 DIAGNOSIS — R600 Localized edema: Secondary | ICD-10-CM

## 2016-09-16 DIAGNOSIS — H9193 Unspecified hearing loss, bilateral: Secondary | ICD-10-CM

## 2016-09-16 NOTE — Progress Notes (Signed)
Location:  [facility] Room Number: 762U Place of Service:  SNF (31)  [name], MD  Patient Care Team: [name], MD as PCP - General (Internal Medicine) [name], MD as Consulting Physician (Orthopedic Surgery) [name], MD as Consulting Physician (Otolaryngology)  Extended Emergency Contact Information Primary Emergency Contact: [name] Address: [place], [place] [address] [name] of [place] Phone: [phone] Relation: Daughter Secondary Emergency Contact: [place], [place] of [place] Mobile Phone: [phone] Relation: Son    Allergies: Amoxicillin; Baclofen; Hctz [hydrochlorothiazide]; and Valium [diazepam]  Chief Complaint  Patient presents with  . Medical Management of Chronic Issues    Routine Visit    HPI: Patient is 83 y.o. female who is being seen for routine issues of HOH, BLE edema and RLS.  Past Medical History:  Diagnosis Date  . Acute encephalopathy   . AKI (acute kidney injury) ([place])   . Altered mental state   . Anemia   . Aortic stenosis   . Arthritis    Back, knees  . Bowel incontinence   . Colon polyps   . Constipation   . Gastric ulcer    years ago  . Hypertension   . Hypokalemia   . Restless legs syndrome    on Requip  . Situational stress   . Spinal stenosis   . Stroke ([place])    Mini, no residual  . TIA (transient ischemic attack)   . Ulcer causing bleeding and hole in wall of stomach or small intestine   . Urinary bladder calculus   . Urinary incontinence   . Vertigo     Past Surgical History:  Procedure Laterality Date  . COLON RESECTION  50 years ago  . LUMBAR LAMINECTOMY/DECOMPRESSION MICRODISCECTOMY  06/25/2011   Procedure: LUMBAR LAMINECTOMY/DECOMPRESSION MICRODISCECTOMY;  Surgeon: Hosie Spangle, MD;  Location: [facility];  Service: Neurosurgery;  Laterality: Right;  RIGHT Lumbar  Two-Three hemilaminectomy and microdiskectomy  . TONSILLECTOMY    . UTERINE FIBROID SURGERY    . VAGINAL HYSTERECTOMY      Allergies as of 09/16/2016      Reactions   Amoxicillin Other (See Comments)   Reaction unknown   Baclofen Other (See Comments)   "fuzzy in the eyes" and dizzy   Hctz [hydrochlorothiazide]    nausea   Valium [diazepam] Other (See Comments)   Pt became unresponsive and O2 sats dropped.      Medication List       Accurate as of 09/16/16 11:59 PM. Always use your most recent med list.          acetaminophen 325 MG tablet Commonly known as:  TYLENOL Take 650 mg by mouth every 6 (six) hours as needed. Notify MD if not relieved. Not to exceed 3000 mg in 24 hour period   amLODipine 5 MG tablet Commonly known as:  NORVASC Take 5 mg by mouth daily.   CHLORASEPTIC MAX SORE THROAT 1.5-33 % Liqd Generic drug:  Phenol-Glycerin Use as directed 1 spray in the mouth or throat 3 (three) times daily as needed. For throat irritation/pain. If sore throat persist notify the MD   ENSURE Take 237 mLs by mouth 2 (two) times daily between meals.   feeding supplement (PRO-STAT SUGAR FREE 64) Liqd Take 30 mLs by mouth 2 (two) times daily.  lubiprostone 24 MCG capsule Commonly known as:  AMITIZA Take 24 mcg by mouth 2 (two) times daily with a meal.   mirtazapine 15 MG tablet Commonly known as:  REMERON Take 15 mg by mouth at bedtime.   morphine 15 MG 12 hr tablet Commonly known as:  MS CONTIN Take 1 tablet (15 mg total) by mouth 2 (two) times daily.   multivitamin tablet Take 1 tablet by mouth daily.   MYRBETRIQ 50 MG Tb24 tablet Generic drug:  mirabegron ER Take 50 mg by mouth daily.   ondansetron 4 MG tablet Commonly known as:  ZOFRAN Take 4 mg by mouth every 12 (twelve) hours as needed for nausea or vomiting.   pantoprazole 40 MG tablet Commonly known as:  PROTONIX Take 40 mg by mouth daily.   polyethylene glycol packet Commonly known as:  MIRALAX /  GLYCOLAX Take 17 g by mouth daily.   potassium chloride 10 MEQ tablet Commonly known as:  K-DUR Take 20 mEq by mouth daily. 2 tablets   rOPINIRole 4 MG tablet Commonly known as:  REQUIP Take 4 mg by mouth at bedtime.   SYSTANE BALANCE 0.6 % Soln Generic drug:  Propylene Glycol Place 1 drop into both eyes 2 (two) times daily.   torsemide 20 MG tablet Commonly known as:  DEMADEX Take 40 mg by mouth every morning. 2 tabs       No orders of the defined types were placed in this encounter.   Immunization History  Administered Date(s) Administered  . Influenza Split 04/15/2012  . Influenza,inj,Quad PF,36+ Mos 03/06/2014  . Influenza-Unspecified 03/01/2016  . PPD Test 08/30/2014, 09/18/2014  . Pneumococcal Polysaccharide-23 08/29/2013    Social History  Substance Use Topics  . Smoking status: Never Smoker  . Smokeless tobacco: Never Used  . Alcohol use 0.0 oz/week     Comment: rarely    Review of Systems  DATA OBTAINED: from patient, nurse GENERAL:  no fevers, fatigue, appetite changes SKIN: No itching, rash HEENT: No complaint RESPIRATORY: No cough, wheezing, SOB CARDIAC: No chest pain, palpitations, lower extremity edema  GI: No abdominal pain, No N/V/D or constipation, No heartburn or reflux  GU: No dysuria, frequency or urgency, or incontinence  MUSCULOSKELETAL: No unrelieved bone/joint pain NEUROLOGIC: No headache, dizziness  PSYCHIATRIC: No overt anxiety or sadness  Vitals:   09/16/16 1339  BP: (!) 108/58  Pulse: 79  Resp: 18  Temp: 97.9 F (36.6 C)   Body mass index is 18.98 kg/m. Physical Exam  GENERAL APPEARANCE: Alert, conversant, No acute distress  SKIN: No diaphoresis rash HEENT: Unremarkable RESPIRATORY: Breathing is even, unlabored. Lung sounds are clear   CARDIOVASCULAR: Heart RRR no murmurs, rubs or gallops. 1+ peripheral edema  GASTROINTESTINAL: Abdomen is soft, non-tender, not distended w/ normal bowel sounds.  GENITOURINARY:  Bladder non tender, not distended  MUSCULOSKELETAL: No abnormal joints or musculature NEUROLOGIC: Cranial nerves 2-12 grossly intact. Moves all extremities PSYCHIATRIC: Mood and affect appropriate to situation with dementia, no behavioral issues  Patient Active Problem List   Diagnosis Date Noted  . Pressure injury of skin 08/15/2016  . Aspiration pneumonia ([place]) 08/14/2016  . Chronic diastolic CHF (congestive heart failure) ([place]) 08/14/2016  . Chronic right shoulder pain 02/09/2016  . Bilateral lower extremity edema 02/02/2016  . Ileitis 01/19/2016  . Pressure ulcer 01/09/2016  . SBO (small bowel obstruction) ([place]) 01/08/2016  . Narcotic addiction ([place]) 05/15/2015  . Diarrhea 03/30/2015  . Acute pharyngitis 02/26/2015  . Osteoarthritis of right knee 02/22/2015  .  Pain in joint, lower leg 10/31/2014  . Ulcer of sacral region, stage 4 ([place]) 04/15/2014  . Moderate aortic stenosis 04/14/2014  . Pre-syncope 04/14/2014  . Hypotension 04/14/2014  . Acute renal failure superimposed on stage 2 chronic kidney disease ([place]) 04/14/2014  . Anemia 04/14/2014  . Protein-calorie malnutrition, severe ([place]) 04/14/2014  . Altered mental status   . Dehydration 04/13/2014  . Altered mental state 04/13/2014  . Acute kidney injury ([place])   . Hypokalemia 03/25/2014  . Syncope 03/16/2014  . Acute encephalopathy 03/16/2014  . Leg swelling 03/02/2014  . Avascular necrosis of bone of right hip ([place]) 09/27/2013  . Primary osteoarthritis of right hip 09/07/2013  . Spinal stenosis, lumbar region, with neurogenic claudication 09/07/2013  . Closed fracture of pubic ramus ([place]) 09/07/2013  . BPPV (benign paroxysmal positional vertigo) 09/07/2013  . Pharyngeal swelling 08/30/2013  . Pelvic fracture ([place]) 08/28/2013  . Fall 08/26/2013  . Dizziness 08/26/2013  . Abnormal stress test 08/26/2013  . Preop cardiovascular exam 08/01/2013  . Aortic stenosis 08/01/2013  . Edema 06/30/2013  . Loss of weight  01/04/2013  . Situational stress 01/04/2013  . Chronic right hip pain 07/05/2012  . Knee pain, acute 06/29/2012  . Vertigo 06/24/2012  . Acute exacerbation of chronic low back pain 06/24/2012  . Lumbar pain with radiation down right leg 06/09/2012  . Osteoarthritis of hip 04/29/2012  . History of TIAs 04/17/2012  . S/P total hysterectomy and bilateral salpingo-oophorectomy 04/17/2012  . Hard of hearing 04/17/2012  . History of anemia 04/17/2012  . RLS (restless legs syndrome) 04/17/2012  . Chronic back pain 04/17/2012  . Spinal stenosis, lumbar 04/17/2012  . Lower back pain 12/20/2011  . Personal history of colonic polyps 10/08/2011  . H/O 09/14/2011  . Essential hypertension 09/14/2011  . Restless legs syndrome 09/14/2011  . Constipation 08/20/2011  . Urinary incontinence 08/20/2011    CMP     Component Value Date/Time   NA 142 08/17/2016 0343   NA 142 08/17/2016   K 3.9 08/17/2016 0343   CL 106 08/17/2016 0343   CO2 28 08/17/2016 0343   GLUCOSE 92 08/17/2016 0343   BUN 22 (H) 08/17/2016 0343   BUN 22 (A) 08/17/2016   CREATININE 1.13 (H) 08/17/2016 0343   CREATININE 0.78 06/28/2014 1205   CALCIUM 7.8 (L) 08/17/2016 0343   PROT 6.6 08/14/2016 1519   ALBUMIN 3.4 (L) 08/14/2016 1519   AST 28 08/14/2016 1519   ALT 17 08/14/2016 1519   ALKPHOS 107 08/14/2016 1519   BILITOT 1.1 08/14/2016 1519   GFRNONAA 40 (L) 08/17/2016 0343   GFRAA 46 (L) 08/17/2016 0343    Recent Labs  01/10/16 0838  08/15/16 0531 08/16/16 08/16/16 0457 08/17/16 08/17/16 0343  NA 143  < > 140 147 147* 142 142  K 3.3*  < > 3.3* 3.1* 3.1*  --  3.9  CL 107  < > 94*  --  104  --  106  CO2 27  < > 34*  --  31  --  28  GLUCOSE 133*  < > 86  --  69  --  92  BUN 29*  < > 28* 23* 23* 22* 22*  CREATININE 0.71  < > 1.79* 1.2* 1.20* 1.1 1.13*  CALCIUM 8.8*  < > 8.0*  --  8.0*  --  7.8*  MG 2.0  --   --   --  2.2  --   --   < > = values in this  interval not displayed.  Recent Labs  01/08/16 0412  03/19/16 06/04/16 08/14/16 1519  AST 21 21 16 28   ALT 15 12 6* 17  ALKPHOS 109 104 109 107  BILITOT 0.5  --   --  1.1  PROT 7.6  --   --  6.6  ALBUMIN 4.4  --   --  3.4*    Recent Labs  01/08/16 0412  08/14/16 1519  08/15/16 0531  08/16/16 0457 09/04/16 09/08/16  WBC 26.4*  < > 21.1*  < > 16.1*  < > 11.4* 6.5 6.7  NEUTROABS 24.0*  --  19.6*  --   --   --   --   --   --   HGB 12.2  < > 9.4*  < > 8.5*  --  8.3* 8.6* 8.4*  HCT 39.2  < > 31.3*  < > 28.2*  --  27.8* 29* 26*  MCV 86.5  < > 81.7  --  82.5  --  83.5  --   --   PLT 336  < > 300  < > 256  --  261 311 299  < > = values in this interval not displayed.  Recent Labs  03/19/16  CHOL 141  LDLCALC 71  TRIG 77   No results found for: [facility] Lab Results  Component Value Date   TSH 2.46 04/26/2016   Lab Results  Component Value Date   HGBA1C 5.6 03/19/2016   Lab Results  Component Value Date   CHOL 141 03/19/2016   HDL 55 03/19/2016   LDLCALC 71 03/19/2016   TRIG 77 03/19/2016   CHOLHDL 2.8 03/06/2014    Significant Diagnostic Results in last 30 days:  No results found.  Assessment and Plan  Hard of hearing Pt has been set up to be seen for hearing aids; will monitor results  Bilateral lower extremity edema Waxes and wanes but muc better overall with demadex 40 mg daily and TED hose being worn on and off  RLS (restless legs syndrome) Controlled ; cont requip 4mg  qHS     Calianna Kim D. [name], MD

## 2016-10-08 LAB — CBC AND DIFFERENTIAL
HEMOGLOBIN: 8.1 — AB (ref 12.0–16.0)
PLATELETS: 355 (ref 150–399)
WBC: 8

## 2016-10-13 ENCOUNTER — Encounter: Payer: Self-pay | Admitting: Internal Medicine

## 2016-10-13 ENCOUNTER — Non-Acute Institutional Stay (SKILLED_NURSING_FACILITY): Payer: Medicare Other | Admitting: Internal Medicine

## 2016-10-13 DIAGNOSIS — G8929 Other chronic pain: Secondary | ICD-10-CM

## 2016-10-13 DIAGNOSIS — M25551 Pain in right hip: Secondary | ICD-10-CM | POA: Diagnosis not present

## 2016-10-13 DIAGNOSIS — D508 Other iron deficiency anemias: Secondary | ICD-10-CM | POA: Diagnosis not present

## 2016-10-13 DIAGNOSIS — M545 Low back pain: Secondary | ICD-10-CM

## 2016-10-13 NOTE — Progress Notes (Signed)
Location:  [facility] Room Number: 341P Place of Service:  SNF (31)  [name], MD  Patient Care Team: [name], MD as PCP - General (Internal Medicine) [name], MD as Consulting Physician (Orthopedic Surgery) [name], MD as Consulting Physician (Otolaryngology)  Extended Emergency Contact Information Primary Emergency Contact: [name] Address: [place], [place] [address] [name] of [place] Phone: [phone] Relation: Daughter Secondary Emergency Contact: [place], [place] of [place] Mobile Phone: [phone] Relation: Son    Allergies: Amoxicillin; Baclofen; Hctz [hydrochlorothiazide]; and Valium [diazepam]  Chief Complaint  Patient presents with  . Medical Management of Chronic Issues    Routine Visit    HPI: Patient is 83 y.o. female who is being seen for routine issues of chronic R hip pain, chronic LBP and anemia.  Past Medical History:  Diagnosis Date  . Acute encephalopathy   . AKI (acute kidney injury) ([place])   . Altered mental state   . Anemia   . Aortic stenosis   . Arthritis    Back, knees  . Bowel incontinence   . Colon polyps   . Constipation   . Gastric ulcer    years ago  . Hypertension   . Hypokalemia   . Restless legs syndrome    on Requip  . Situational stress   . Spinal stenosis   . Stroke ([place])    Mini, no residual  . TIA (transient ischemic attack)   . Ulcer causing bleeding and hole in wall of stomach or small intestine   . Urinary bladder calculus   . Urinary incontinence   . Vertigo     Past Surgical History:  Procedure Laterality Date  . COLON RESECTION  50 years ago  . LUMBAR LAMINECTOMY/DECOMPRESSION MICRODISCECTOMY  06/25/2011   Procedure: LUMBAR LAMINECTOMY/DECOMPRESSION MICRODISCECTOMY;  Surgeon: Hosie Spangle, MD;  Location: [facility];  Service: Neurosurgery;  Laterality:  Right;  RIGHT Lumbar Two-Three hemilaminectomy and microdiskectomy  . TONSILLECTOMY    . UTERINE FIBROID SURGERY    . VAGINAL HYSTERECTOMY      Allergies as of 10/13/2016      Reactions   Amoxicillin Other (See Comments)   Reaction unknown   Baclofen Other (See Comments)   "fuzzy in the eyes" and dizzy   Hctz [hydrochlorothiazide]    nausea   Valium [diazepam] Other (See Comments)   Pt became unresponsive and O2 sats dropped.      Medication List       Accurate as of 10/13/16 11:59 PM. Always use your most recent med list.          acetaminophen 325 MG tablet Commonly known as:  TYLENOL Take 650 mg by mouth every 6 (six) hours as needed. Notify MD if not relieved. Not to exceed 3000 mg in 24 hour period   amLODipine 5 MG tablet Commonly known as:  NORVASC Take 5 mg by mouth daily.   CHLORASEPTIC MAX SORE THROAT 1.5-33 % Liqd Generic drug:  Phenol-Glycerin Use as directed 1 spray in the mouth or throat 3 (three) times daily as needed. For throat irritation/pain. If sore throat persist notify the MD   ENSURE Take 237 mLs by mouth 2 (two) times daily between meals.   feeding supplement (PRO-STAT SUGAR FREE 64) Liqd Take 30 mLs by mouth 2 (two) times  daily.   lubiprostone 24 MCG capsule Commonly known as:  AMITIZA Take 24 mcg by mouth 2 (two) times daily with a meal.   mirtazapine 15 MG tablet Commonly known as:  REMERON Take 15 mg by mouth at bedtime.   morphine 15 MG 12 hr tablet Commonly known as:  MS CONTIN Take 1 tablet (15 mg total) by mouth 2 (two) times daily.   multivitamin tablet Take 1 tablet by mouth daily.   MYRBETRIQ 50 MG Tb24 tablet Generic drug:  mirabegron ER Take 50 mg by mouth daily.   ondansetron 4 MG tablet Commonly known as:  ZOFRAN Take 4 mg by mouth every 12 (twelve) hours as needed for nausea or vomiting.   pantoprazole 40 MG tablet Commonly known as:  PROTONIX Take 40 mg by mouth daily.   polyethylene glycol packet Commonly  known as:  MIRALAX / GLYCOLAX Take 17 g by mouth daily.   potassium chloride 10 MEQ tablet Commonly known as:  K-DUR Take 20 mEq by mouth daily. 2 tablets   rOPINIRole 4 MG tablet Commonly known as:  REQUIP Take 4 mg by mouth at bedtime.   SYSTANE BALANCE 0.6 % Soln Generic drug:  Propylene Glycol Place 1 drop into both eyes 2 (two) times daily.   torsemide 20 MG tablet Commonly known as:  DEMADEX Take 40 mg by mouth every morning. 2 tabs       No orders of the defined types were placed in this encounter.   Immunization History  Administered Date(s) Administered  . Influenza Split 04/15/2012  . Influenza,inj,Quad PF,36+ Mos 03/06/2014  . Influenza-Unspecified 03/01/2016  . PPD Test 08/30/2014, 09/18/2014  . Pneumococcal Polysaccharide-23 08/29/2013    Social History  Substance Use Topics  . Smoking status: Never Smoker  . Smokeless tobacco: Never Used  . Alcohol use 0.0 oz/week     Comment: rarely    Review of Systems  DATA OBTAINED: from patient, nurse GENERAL:  no fevers, fatigue, appetite changes SKIN: No itching, rash HEENT: No complaint RESPIRATORY: No cough, wheezing, SOB CARDIAC: No chest pain, palpitations, lower extremity edema  GI: No abdominal pain, No N/V/D or constipation, No heartburn or reflux  GU: No dysuria, frequency or urgency, or incontinence  MUSCULOSKELETAL: No unrelieved bone/joint pain NEUROLOGIC: No headache, dizziness  PSYCHIATRIC: No overt anxiety or sadness  Vitals:   10/13/16 1031  BP: (!) 108/58  Pulse: 89  Resp: 20  Temp: 97.9 F (36.6 C)   Body mass index is 19.22 kg/m. Physical Exam  GENERAL APPEARANCE: Alert, conversant, No acute distress  SKIN: No diaphoresis rash HEENT: Unremarkable RESPIRATORY: Breathing is even, unlabored. Lung sounds are clear   CARDIOVASCULAR: Heart RRR no murmurs, rubs or gallops. No peripheral edema  GASTROINTESTINAL: Abdomen is soft, non-tender, not distended w/ normal bowel sounds.    GENITOURINARY: Bladder non tender, not distended  MUSCULOSKELETAL: No abnormal joints or musculature NEUROLOGIC: Cranial nerves 2-12 grossly intact. Moves all extremities PSYCHIATRIC: Mood and affect appropriate to situation with some dementia, no behavioral issues  Patient Active Problem List   Diagnosis Date Noted  . Pressure injury of skin 08/15/2016  . Aspiration pneumonia ([place]) 08/14/2016  . Chronic diastolic CHF (congestive heart failure) ([place]) 08/14/2016  . Chronic right shoulder pain 02/09/2016  . Bilateral lower extremity edema 02/02/2016  . Ileitis 01/19/2016  . Pressure ulcer 01/09/2016  . SBO (small bowel obstruction) ([place]) 01/08/2016  . Narcotic addiction ([place]) 05/15/2015  . Diarrhea 03/30/2015  . Acute pharyngitis 02/26/2015  .  Osteoarthritis of right knee 02/22/2015  . Pain in joint, lower leg 10/31/2014  . Ulcer of sacral region, stage 4 ([place]) 04/15/2014  . Moderate aortic stenosis 04/14/2014  . Pre-syncope 04/14/2014  . Hypotension 04/14/2014  . Acute renal failure superimposed on stage 2 chronic kidney disease ([place]) 04/14/2014  . Anemia 04/14/2014  . Protein-calorie malnutrition, severe ([place]) 04/14/2014  . Altered mental status   . Dehydration 04/13/2014  . Altered mental state 04/13/2014  . Acute kidney injury ([place])   . Hypokalemia 03/25/2014  . Syncope 03/16/2014  . Acute encephalopathy 03/16/2014  . Leg swelling 03/02/2014  . Avascular necrosis of bone of right hip ([place]) 09/27/2013  . Primary osteoarthritis of right hip 09/07/2013  . Spinal stenosis, lumbar region, with neurogenic claudication 09/07/2013  . Closed fracture of pubic ramus ([place]) 09/07/2013  . BPPV (benign paroxysmal positional vertigo) 09/07/2013  . Pharyngeal swelling 08/30/2013  . Pelvic fracture ([place]) 08/28/2013  . Fall 08/26/2013  . Dizziness 08/26/2013  . Abnormal stress test 08/26/2013  . Preop cardiovascular exam 08/01/2013  . Aortic stenosis 08/01/2013  . Edema 06/30/2013   . Loss of weight 01/04/2013  . Situational stress 01/04/2013  . Chronic right hip pain 07/05/2012  . Knee pain, acute 06/29/2012  . Vertigo 06/24/2012  . Acute exacerbation of chronic low back pain 06/24/2012  . Lumbar pain with radiation down right leg 06/09/2012  . Osteoarthritis of hip 04/29/2012  . History of TIAs 04/17/2012  . S/P total hysterectomy and bilateral salpingo-oophorectomy 04/17/2012  . Hard of hearing 04/17/2012  . History of anemia 04/17/2012  . RLS (restless legs syndrome) 04/17/2012  . Chronic back pain 04/17/2012  . Spinal stenosis, lumbar 04/17/2012  . Lower back pain 12/20/2011  . Personal history of colonic polyps 10/08/2011  . H/O 09/14/2011  . Essential hypertension 09/14/2011  . Restless legs syndrome 09/14/2011  . Constipation 08/20/2011  . Urinary incontinence 08/20/2011    CMP     Component Value Date/Time   NA 142 08/17/2016 0343   NA 142 08/17/2016   K 3.9 08/17/2016 0343   CL 106 08/17/2016 0343   CO2 28 08/17/2016 0343   GLUCOSE 92 08/17/2016 0343   BUN 22 (H) 08/17/2016 0343   BUN 22 (A) 08/17/2016   CREATININE 1.13 (H) 08/17/2016 0343   CREATININE 0.78 06/28/2014 1205   CALCIUM 7.8 (L) 08/17/2016 0343   PROT 6.6 08/14/2016 1519   ALBUMIN 3.4 (L) 08/14/2016 1519   AST 28 08/14/2016 1519   ALT 17 08/14/2016 1519   ALKPHOS 107 08/14/2016 1519   BILITOT 1.1 08/14/2016 1519   GFRNONAA 40 (L) 08/17/2016 0343   GFRAA 46 (L) 08/17/2016 0343    Recent Labs  01/10/16 0838  08/15/16 0531 08/16/16 08/16/16 0457 08/17/16 08/17/16 0343  NA 143  < > 140 147 147* 142 142  K 3.3*  < > 3.3* 3.1* 3.1*  --  3.9  CL 107  < > 94*  --  104  --  106  CO2 27  < > 34*  --  31  --  28  GLUCOSE 133*  < > 86  --  69  --  92  BUN 29*  < > 28* 23* 23* 22* 22*  CREATININE 0.71  < > 1.79* 1.2* 1.20* 1.1 1.13*  CALCIUM 8.8*  < > 8.0*  --  8.0*  --  7.8*  MG 2.0  --   --   --  2.2  --   --   < > =  values in this interval not displayed.  Recent  Labs  01/08/16 0412 03/19/16 06/04/16 08/14/16 1519  AST 21 21 16 28   ALT 15 12 6* 17  ALKPHOS 109 104 109 107  BILITOT 0.5  --   --  1.1  PROT 7.6  --   --  6.6  ALBUMIN 4.4  --   --  3.4*    Recent Labs  01/08/16 0412  08/14/16 1519  08/15/16 0531  08/16/16 0457 09/04/16 09/08/16  WBC 26.4*  < > 21.1*  < > 16.1*  < > 11.4* 6.5 6.7  NEUTROABS 24.0*  --  19.6*  --   --   --   --   --   --   HGB 12.2  < > 9.4*  < > 8.5*  --  8.3* 8.6* 8.4*  HCT 39.2  < > 31.3*  < > 28.2*  --  27.8* 29* 26*  MCV 86.5  < > 81.7  --  82.5  --  83.5  --   --   PLT 336  < > 300  < > 256  --  261 311 299  < > = values in this interval not displayed.  Recent Labs  03/19/16  CHOL 141  LDLCALC 71  TRIG 77   No results found for: [facility] Lab Results  Component Value Date   TSH 2.46 04/26/2016   Lab Results  Component Value Date   HGBA1C 5.6 03/19/2016   Lab Results  Component Value Date   CHOL 141 03/19/2016   HDL 55 03/19/2016   LDLCALC 71 03/19/2016   TRIG 77 03/19/2016   CHOLHDL 2.8 03/06/2014    Significant Diagnostic Results in last 30 days:  No results found.  Assessment and Plan  Chronic right hip pain Chronic and stable and followed by pain clinic for same; plan to cont pain clinic regimen--> MS Contin 15 mg q 12 and oxycodone 5 mg 5 X daily  Chronic back pain At times along with the R hip pain ; will cont pain clinic recommendations --> MS Contin 15 mg q 12  And oxycodone 5 mg 5X daily  Anemia Hb 8.4, which is stable from prior which is fine but pt is supposed to be on iron but is  Not;will try again iron 325 mg daily    Alixandrea Milleson D. [name], MD

## 2016-10-15 ENCOUNTER — Encounter: Payer: Self-pay | Admitting: Internal Medicine

## 2016-10-18 ENCOUNTER — Encounter: Payer: Self-pay | Admitting: Internal Medicine

## 2016-10-18 NOTE — Assessment & Plan Note (Signed)
Controlled ; cont requip 4mg  qHS

## 2016-10-18 NOTE — Assessment & Plan Note (Signed)
Pt has been set up to be seen for hearing aids; will monitor results

## 2016-10-18 NOTE — Assessment & Plan Note (Signed)
Waxes and wanes but muc better overall with demadex 40 mg daily and TED hose being worn on and off

## 2016-10-27 ENCOUNTER — Non-Acute Institutional Stay (SKILLED_NURSING_FACILITY): Payer: Medicare Other | Admitting: Internal Medicine

## 2016-10-27 ENCOUNTER — Encounter: Payer: Self-pay | Admitting: Internal Medicine

## 2016-10-27 DIAGNOSIS — R229 Localized swelling, mass and lump, unspecified: Secondary | ICD-10-CM

## 2016-10-27 NOTE — Progress Notes (Signed)
Location:  [place] Room Number: 725D Place of Service:  SNF (31)  Shelly Duos, MD  Patient Care Team: Shelly Duos, MD as PCP - General (Internal Medicine) Shelly Arabian, MD as Consulting Physician (Orthopedic Surgery) Shelly Nunnery, MD as Consulting Physician (Otolaryngology)  Extended Emergency Contact Information Primary Emergency Contact: Shelly Martin Address: [place], [place] [address] Shelly Martin of [place] Phone: [phone] Relation: Daughter Secondary Emergency Contact: Shelly Martin, [place] of [place] Mobile Phone: [phone] Relation: Son    Allergies: Amoxicillin; Baclofen; Hctz [hydrochlorothiazide]; and Valium [diazepam]  Chief Complaint  Patient presents with  . Acute Visit    Acute    HPI: Patient is 83 y.o. female who Who nursing asked me to see because she has a swelling behind her ear. Patient thinks it is been there for several weeks. It is a little tender to touch but otherwise it doesn't bother her. Nothing makes it better, touching makes it a little bit tender.  Past Medical History:  Diagnosis Date  . Acute encephalopathy   . AKI (acute kidney injury) ([place])   . Altered mental state   . Anemia   . Aortic stenosis   . Arthritis    Back, knees  . Bowel incontinence   . Colon polyps   . Constipation   . Gastric ulcer    years ago  . Hypertension   . Hypokalemia   . Restless legs syndrome    on Requip  . Situational stress   . Spinal stenosis   . Stroke ([place])    Mini, no residual  . TIA (transient ischemic attack)   . Ulcer causing bleeding and hole in wall of stomach or small intestine   . Urinary bladder calculus   . Urinary incontinence   . Vertigo     Past Surgical History:  Procedure Laterality Date  . COLON RESECTION  50 years ago  . LUMBAR LAMINECTOMY/DECOMPRESSION MICRODISCECTOMY  06/25/2011   Procedure:  LUMBAR LAMINECTOMY/DECOMPRESSION MICRODISCECTOMY;  Surgeon: Hosie Spangle, MD;  Location: [facility];  Service: Neurosurgery;  Laterality: Right;  RIGHT Lumbar Two-Three hemilaminectomy and microdiskectomy  . TONSILLECTOMY    . UTERINE FIBROID SURGERY    . VAGINAL HYSTERECTOMY      Allergies as of 10/27/2016      Reactions   Amoxicillin Other (See Comments)   Reaction unknown   Baclofen Other (See Comments)   "fuzzy in the eyes" and dizzy   Hctz [hydrochlorothiazide]    nausea   Valium [diazepam] Other (See Comments)   Pt became unresponsive and O2 sats dropped.      Medication List       Accurate as of 10/27/16 11:59 PM. Always use your most recent med list.          acetaminophen 325 MG tablet Commonly known as:  TYLENOL Take 650 mg by mouth every 6 (six) hours as needed. Notify MD if not relieved. Not to exceed 3000 mg in 24 hour period   amLODipine 5 MG tablet Commonly known as:  NORVASC Take 5 mg by mouth daily.   CHLORASEPTIC MAX SORE THROAT 1.5-33 % Liqd Generic drug:  Phenol-Glycerin Use as directed 1 spray in the mouth or throat 3 (three) times daily as needed. For throat irritation/pain. If sore throat persist notify the MD   ENSURE  Take 237 mLs by mouth 2 (two) times daily between meals.   feeding supplement (PRO-STAT SUGAR FREE 64) Liqd Take 30 mLs by mouth 2 (two) times daily.   lubiprostone 24 MCG capsule Commonly known as:  AMITIZA Take 24 mcg by mouth 2 (two) times daily with a meal.   mirtazapine 15 MG tablet Commonly known as:  REMERON Take 15 mg by mouth at bedtime.   morphine 15 MG 12 hr tablet Commonly known as:  MS CONTIN Take 1 tablet (15 mg total) by mouth 2 (two) times daily.   multivitamin tablet Take 1 tablet by mouth daily.   MYRBETRIQ 50 MG Tb24 tablet Generic drug:  mirabegron ER Take 50 mg by mouth daily.   ondansetron 4 MG tablet Commonly known as:  ZOFRAN Take 4 mg by mouth every 12 (twelve) hours as needed for nausea  or vomiting.   pantoprazole 40 MG tablet Commonly known as:  PROTONIX Take 40 mg by mouth daily.   polyethylene glycol packet Commonly known as:  MIRALAX / GLYCOLAX Take 17 g by mouth daily.   potassium chloride 10 MEQ tablet Commonly known as:  K-DUR Take 20 mEq by mouth daily. 2 tablets   rOPINIRole 4 MG tablet Commonly known as:  REQUIP Take 4 mg by mouth at bedtime.   SYSTANE BALANCE 0.6 % Soln Generic drug:  Propylene Glycol Place 1 drop into both eyes 2 (two) times daily.   torsemide 20 MG tablet Commonly known as:  DEMADEX Take 40 mg by mouth every morning. 2 tabs       No orders of the defined types were placed in this encounter.   Immunization History  Administered Date(s) Administered  . Influenza Split 04/15/2012  . Influenza,inj,Quad PF,36+ Mos 03/06/2014  . Influenza-Unspecified 03/01/2016  . PPD Test 08/30/2014, 09/18/2014  . Pneumococcal Polysaccharide-23 08/29/2013    Social History  Substance Use Topics  . Smoking status: Never Smoker  . Smokeless tobacco: Never Used  . Alcohol use 0.0 oz/week     Comment: rarely    Review of Systems  DATA OBTAINED: from patient GENERAL:  no fevers, fatigue, appetite changes SKIN: No itching, rash HEENT: swelling behind R ear as per history of present illness RESPIRATORY: No cough, wheezing, SOB CARDIAC: No chest pain, palpitations, lower extremity edema  GI: No abdominal pain, No N/V/D or constipation, No heartburn or reflux  GU: No dysuria, frequency or urgency, or incontinence  MUSCULOSKELETAL: No unrelieved bone/joint pain NEUROLOGIC: No headache, dizziness  PSYCHIATRIC: No overt anxiety or sadness  Vitals:   10/27/16 1628  BP: (!) 108/58  Pulse: 80  Resp: 16  Temp: 97.9 F (36.6 C)   Body mass index is 18.94 kg/m. Physical Exam  GENERAL APPEARANCE: Alert, conversant, No acute distress  SKIN: No diaphoresis rash HEENT: soft moveable, mildly tender nodule , about 8-10 mm behind R ear,  not red RESPIRATORY: Breathing is even, unlabored. Lung sounds are clear   CARDIOVASCULAR: Heart RRR no murmurs, rubs or gallops. No peripheral edema  GASTROINTESTINAL: Abdomen is soft, non-tender, not distended w/ normal bowel sounds.  GENITOURINARY: Bladder non tender, not distended  MUSCULOSKELETAL: No abnormal joints or musculature NEUROLOGIC: Cranial nerves 2-12 grossly intact. Moves all extremities PSYCHIATRIC: Mood and affect appropriate with dementia, no behavioral issues  Patient Active Problem List   Diagnosis Date Noted  . Pressure injury of skin 08/15/2016  . Aspiration pneumonia ([place]) 08/14/2016  . Chronic diastolic CHF (congestive heart failure) ([place]) 08/14/2016  . Chronic right  shoulder pain 02/09/2016  . Bilateral lower extremity edema 02/02/2016  . Ileitis 01/19/2016  . Pressure ulcer 01/09/2016  . SBO (small bowel obstruction) ([place]) 01/08/2016  . Narcotic addiction ([place]) 05/15/2015  . Diarrhea 03/30/2015  . Acute pharyngitis 02/26/2015  . Osteoarthritis of right knee 02/22/2015  . Pain in joint, lower leg 10/31/2014  . Ulcer of sacral region, stage 4 ([place]) 04/15/2014  . Moderate aortic stenosis 04/14/2014  . Pre-syncope 04/14/2014  . Hypotension 04/14/2014  . Acute renal failure superimposed on stage 2 chronic kidney disease ([place]) 04/14/2014  . Anemia 04/14/2014  . Protein-calorie malnutrition, severe ([place]) 04/14/2014  . Altered mental status   . Dehydration 04/13/2014  . Altered mental state 04/13/2014  . Acute kidney injury ([place])   . Hypokalemia 03/25/2014  . Syncope 03/16/2014  . Acute encephalopathy 03/16/2014  . Leg swelling 03/02/2014  . Avascular necrosis of bone of right hip ([place]) 09/27/2013  . Primary osteoarthritis of right hip 09/07/2013  . Spinal stenosis, lumbar region, with neurogenic claudication 09/07/2013  . Closed fracture of pubic ramus ([place]) 09/07/2013  . BPPV (benign paroxysmal positional vertigo) 09/07/2013  . Pharyngeal swelling  08/30/2013  . Pelvic fracture ([place]) 08/28/2013  . Fall 08/26/2013  . Dizziness 08/26/2013  . Abnormal stress test 08/26/2013  . Preop cardiovascular exam 08/01/2013  . Aortic stenosis 08/01/2013  . Edema 06/30/2013  . Loss of weight 01/04/2013  . Situational stress 01/04/2013  . Chronic right hip pain 07/05/2012  . Knee pain, acute 06/29/2012  . Vertigo 06/24/2012  . Acute exacerbation of chronic low back pain 06/24/2012  . Lumbar pain with radiation down right leg 06/09/2012  . Osteoarthritis of hip 04/29/2012  . History of TIAs 04/17/2012  . S/P total hysterectomy and bilateral salpingo-oophorectomy 04/17/2012  . Hard of hearing 04/17/2012  . History of anemia 04/17/2012  . RLS (restless legs syndrome) 04/17/2012  . Chronic back pain 04/17/2012  . Spinal stenosis, lumbar 04/17/2012  . Lower back pain 12/20/2011  . Personal history of colonic polyps 10/08/2011  . H/O 09/14/2011  . Essential hypertension 09/14/2011  . Restless legs syndrome 09/14/2011  . Constipation 08/20/2011  . Urinary incontinence 08/20/2011    CMP     Component Value Date/Time   NA 142 08/17/2016 0343   NA 142 08/17/2016   K 3.9 08/17/2016 0343   CL 106 08/17/2016 0343   CO2 28 08/17/2016 0343   GLUCOSE 92 08/17/2016 0343   BUN 22 (H) 08/17/2016 0343   BUN 22 (A) 08/17/2016   CREATININE 1.13 (H) 08/17/2016 0343   CREATININE 0.78 06/28/2014 1205   CALCIUM 7.8 (L) 08/17/2016 0343   PROT 6.6 08/14/2016 1519   ALBUMIN 3.4 (L) 08/14/2016 1519   AST 28 08/14/2016 1519   ALT 17 08/14/2016 1519   ALKPHOS 107 08/14/2016 1519   BILITOT 1.1 08/14/2016 1519   GFRNONAA 40 (L) 08/17/2016 0343   GFRAA 46 (L) 08/17/2016 0343    Recent Labs  01/10/16 0838  08/15/16 0531 08/16/16 08/16/16 0457 08/17/16 08/17/16 0343  NA 143  < > 140 147 147* 142 142  K 3.3*  < > 3.3* 3.1* 3.1*  --  3.9  CL 107  < > 94*  --  104  --  106  CO2 27  < > 34*  --  31  --  28  GLUCOSE 133*  < > 86  --  69  --  92  BUN  29*  < > 28* 23* 23* 22* 22*  CREATININE 0.71  < > 1.79* 1.2* 1.20* 1.1 1.13*  CALCIUM 8.8*  < > 8.0*  --  8.0*  --  7.8*  MG 2.0  --   --   --  2.2  --   --   < > = values in this interval not displayed.  Recent Labs  01/08/16 0412 03/19/16 06/04/16 08/14/16 1519  AST 21 21 16 28   ALT 15 12 6* 17  ALKPHOS 109 104 109 107  BILITOT 0.5  --   --  1.1  PROT 7.6  --   --  6.6  ALBUMIN 4.4  --   --  3.4*    Recent Labs  01/08/16 0412  08/14/16 1519  08/15/16 0531  08/16/16 0457 09/04/16 09/08/16  WBC 26.4*  < > 21.1*  < > 16.1*  < > 11.4* 6.5 6.7  NEUTROABS 24.0*  --  19.6*  --   --   --   --   --   --   HGB 12.2  < > 9.4*  < > 8.5*  --  8.3* 8.6* 8.4*  HCT 39.2  < > 31.3*  < > 28.2*  --  27.8* 29* 26*  MCV 86.5  < > 81.7  --  82.5  --  83.5  --   --   PLT 336  < > 300  < > 256  --  261 311 299  < > = values in this interval not displayed.  Recent Labs  03/19/16  CHOL 141  LDLCALC 71  TRIG 77   No results found for: [facility] Lab Results  Component Value Date   TSH 2.46 04/26/2016   Lab Results  Component Value Date   HGBA1C 5.6 03/19/2016   Lab Results  Component Value Date   CHOL 141 03/19/2016   HDL 55 03/19/2016   LDLCALC 71 03/19/2016   TRIG 77 03/19/2016   CHOLHDL 2.8 03/06/2014    Significant Diagnostic Results in last 30 days:  No results found.  Assessment and Plan  SKIN NODULE BEHIND RIGHT EAR-patient says it has not changed in the past several weeks in size or sensitivity; it is probably a benign cyst which will go down by itself; we will monitor   Webb Silversmith D. [name], MD

## 2016-11-05 ENCOUNTER — Non-Acute Institutional Stay (SKILLED_NURSING_FACILITY): Payer: Medicare Other | Admitting: Internal Medicine

## 2016-11-05 DIAGNOSIS — R197 Diarrhea, unspecified: Secondary | ICD-10-CM

## 2016-11-07 ENCOUNTER — Non-Acute Institutional Stay (SKILLED_NURSING_FACILITY): Payer: Medicare Other

## 2016-11-07 DIAGNOSIS — Z0000 Encounter for general adult medical examination without abnormal findings: Secondary | ICD-10-CM | POA: Diagnosis not present

## 2016-11-07 NOTE — Progress Notes (Signed)
Subjective:   Shelly Martin is a 83 y.o. female who presents for an Initial Medicare Annual Wellness Visit at [facility]     Objective:    Today's Vitals   11/07/16 1516 11/07/16 1517  BP: 118/60   Pulse: 79   Temp: 98.4 F (36.9 C)   TempSrc: Oral   SpO2: 97%   Weight: 97 lb (44 kg)   Height: 5' (1.524 m)   PainSc:  10-Worst pain ever   Body mass index is 18.94 kg/m.   Current Medications (verified) Outpatient Encounter Prescriptions as of 11/07/2016  Medication Sig  . acetaminophen (TYLENOL) 325 MG tablet Take 650 mg by mouth every 6 (six) hours as needed. Notify MD if not relieved. Not to exceed 3000 mg in 24 hour period  . Amino Acids-Protein Hydrolys (FEEDING SUPPLEMENT, PRO-STAT SUGAR FREE 64,) LIQD Take 30 mLs by mouth 2 (two) times daily.  Marland Kitchen amLODipine (NORVASC) 5 MG tablet Take 5 mg by mouth daily.  Marland Kitchen ENSURE (ENSURE) Take 237 mLs by mouth 2 (two) times daily between meals.  . lubiprostone (AMITIZA) 24 MCG capsule Take 24 mcg by mouth 2 (two) times daily with a meal.   . mirabegron ER (MYRBETRIQ) 50 MG TB24 tablet Take 50 mg by mouth daily.  . mirtazapine (REMERON) 15 MG tablet Take 15 mg by mouth at bedtime.   Marland Kitchen morphine (MS CONTIN) 15 MG 12 hr tablet Take 1 tablet (15 mg total) by mouth 2 (two) times daily.  . Multiple Vitamin (MULTIVITAMIN) tablet Take 1 tablet by mouth daily.  . ondansetron (ZOFRAN) 4 MG tablet Take 4 mg by mouth every 12 (twelve) hours as needed for nausea or vomiting.  Marland Kitchen oxyCODONE (OXY IR/ROXICODONE) 5 MG immediate release tablet Take 5 mg by mouth 5 (five) times daily as needed for severe pain.  . pantoprazole (PROTONIX) 40 MG tablet Take 40 mg by mouth daily.   . Phenol-Glycerin (CHLORASEPTIC MAX SORE THROAT) 1.5-33 % LIQD Use as directed 1 spray in the mouth or throat 3 (three) times daily as needed. For throat irritation/pain. If sore throat persist notify the MD  . potassium chloride (K-DUR) 10 MEQ tablet Take 20 mEq by  mouth daily. 2 tablets   . Propylene Glycol (SYSTANE BALANCE) 0.6 % SOLN Place 1 drop into both eyes 2 (two) times daily.  Marland Kitchen rOPINIRole (REQUIP) 4 MG tablet Take 4 mg by mouth at bedtime.  . torsemide (DEMADEX) 20 MG tablet Take 40 mg by mouth every morning. 2 tabs  . [DISCONTINUED] oxycodone (OXY-IR) 5 MG capsule Take 5 mg by mouth 4 (four) times daily.  . [DISCONTINUED] polyethylene glycol (MIRALAX / GLYCOLAX) packet Take 17 g by mouth daily.   No facility-administered encounter medications on file as of 11/07/2016.     Allergies (verified) Amoxicillin; Baclofen; Hctz [hydrochlorothiazide]; and Valium [diazepam]   History: Past Medical History:  Diagnosis Date  . Acute encephalopathy   . AKI (acute kidney injury) ([place])   . Altered mental state   . Anemia   . Aortic stenosis   . Arthritis    Back, knees  . Bowel incontinence   . Colon polyps   . Constipation   . Gastric ulcer    years ago  . Hypertension   . Hypokalemia   . Restless legs syndrome    on Requip  . Situational stress   . Spinal stenosis   . Stroke [facility])    Mini, no residual  . TIA (transient  ischemic attack)   . Ulcer causing bleeding and hole in wall of stomach or small intestine   . Urinary bladder calculus   . Urinary incontinence   . Vertigo    Past Surgical History:  Procedure Laterality Date  . COLON RESECTION  50 years ago  . LUMBAR LAMINECTOMY/DECOMPRESSION MICRODISCECTOMY  06/25/2011   Procedure: LUMBAR LAMINECTOMY/DECOMPRESSION MICRODISCECTOMY;  Surgeon: Hosie Spangle, MD;  Location: [facility];  Service: Neurosurgery;  Laterality: Right;  RIGHT Lumbar Two-Three hemilaminectomy and microdiskectomy  . TONSILLECTOMY    . UTERINE FIBROID SURGERY    . VAGINAL HYSTERECTOMY     Family History  Problem Relation Age of Onset  . Dementia Mother   . Stroke Father   . Pancreatic cancer Brother   . Anesthesia problems Neg Hx   . Colon cancer Neg Hx   . Esophageal cancer Neg Hx   . Stomach  cancer Neg Hx   . Diabetes Neg Hx   . Kidney disease Neg Hx   . Liver disease Neg Hx    Social History   Occupational History  . Retired    Social History Main Topics  . Smoking status: Never Smoker  . Smokeless tobacco: Never Used  . Alcohol use 0.0 oz/week     Comment: rarely  . Drug use: No  . Sexual activity: No    Tobacco Counseling Counseling given: Not Answered   Activities of Daily Living In your present state of health, do you have any difficulty performing the following activities: 11/07/2016 08/14/2016  Hearing? Tempie Donning  Vision? N N  Difficulty concentrating or making decisions? N Y  Walking or climbing stairs? Y Y  Dressing or bathing? Y Y  Doing errands, shopping? Tempie Donning  Preparing Food and eating ? Y -  Using the Toilet? Y -  In the past six months, have you accidently leaked urine? Y -  Do you have problems with loss of bowel control? Y -  Managing your Medications? Y -  Managing your Finances? Y -  Housekeeping or managing your Housekeeping? Y -  Some recent data might be hidden    Immunizations and Health Maintenance Immunization History  Administered Date(s) Administered  . Influenza Split 04/15/2012  . Influenza,inj,Quad PF,36+ Mos 03/06/2014  . Influenza-Unspecified 03/01/2016  . PPD Test 08/30/2014, 09/18/2014  . Pneumococcal Polysaccharide-23 08/29/2013   Health Maintenance Due  Topic Date Due  . PNA vac Low Risk Adult (2 of 2 - PCV13) 08/30/2014    Patient Care Team: [name], MD as PCP - General (Internal Medicine) [name], MD as Consulting Physician (Orthopedic Surgery) [name], MD as Consulting Physician (Otolaryngology)  Indicate any recent Medical Services you may have received from other than [facility] providers in the past year (date may be approximate).     Assessment:   This is a routine wellness examination for [facility].   Hearing/Vision screen No exam data present  Dietary issues and exercise  activities discussed: Current Exercise Habits: The patient does not participate in regular exercise at present, Exercise limited by: None identified  Goals    . Maintain Lifestyle          Pt will maintain lifestyle.       Depression Screen PHQ 2/9 Scores 11/07/2016 06/30/2013  PHQ - 2 Score 0 2  PHQ- 9 Score - 6    Fall Risk Fall Risk  11/07/2016 06/30/2013 01/04/2013  Falls in the past year? No Yes No  Risk for  fall due to : - Impaired balance/gait;Impaired mobility;Medication side effect -    Cognitive Function:     6CIT Screen 11/07/2016  What Year? 4 points  What month? 0 points  What time? 0 points  Count back from 20 0 points  Months in reverse 2 points  Repeat phrase 10 points  Total Score 16    Screening Tests Health Maintenance  Topic Date Due  . PNA vac Low Risk Adult (2 of 2 - PCV13) 08/30/2014  . DEXA SCAN  05/27/2023 (Originally 06/09/1983)  . TETANUS/TDAP  08/24/2023 (Originally 06/10/1937)  . INFLUENZA VACCINE  12/24/2016      Plan:    I have personally reviewed and addressed the Medicare Annual Wellness questionnaire and have noted the following in the patient's chart:  A. Medical and social history B. Use of alcohol, tobacco or illicit drugs  C. Current medications and supplements D. Functional ability and status E.  Nutritional status F.  Physical activity G. Advance directives H. List of other physicians I.  Hospitalizations, surgeries, and ER visits in previous 12 months J.  [place] to include hearing, vision, cognitive, depression L. Referrals and appointments - none  In addition, I have reviewed and discussed with patient certain preventive protocols, quality metrics, and best practice recommendations. A written personalized care plan for preventive services as well as general preventive health recommendations were provided to patient.  See attached scanned questionnaire for additional information.   Signed,   Rich Reining,  RN Nurse Health Advisor   Quick Notes   Health Maintenance: DEXA, [phone] and TDAP due     Abnormal Screen: 6 Cit-16     Patient Concerns: Pt has upset stomach, nausea, no appetite starting last night     Nurse Concerns: None

## 2016-11-07 NOTE — Patient Instructions (Signed)
Shelly Martin , Thank you for taking time to come for your Medicare Wellness Visit. I appreciate your ongoing commitment to your health goals. Please review the following plan we discussed and let me know if I can assist you in the future.   Screening recommendations/referrals: Colonoscopy up to date, pt over age 81 Mammogram up to date, pt over age 44 Bone Density due Recommended yearly ophthalmology/optometry visit for glaucoma screening and checkup Recommended yearly dental visit for hygiene and checkup  Vaccinations: Influenza vaccine up to date, due 03/01/17 Pneumococcal vaccine 13 due Tdap vaccine due Shingles vaccine not in records    Advanced directives: DNR in chart  Conditions/risks identified: none  Next appointment: Dr. [name] does monthly rounds   Preventive Care 37 Years and Older, Female Preventive care refers to lifestyle choices and visits with your health care provider that can promote health and wellness. What does preventive care include?  A yearly physical exam. This is also called an annual well check.  Dental exams once or twice a year.  Routine eye exams. Ask your health care provider how often you should have your eyes checked.  Personal lifestyle choices, including:  Daily care of your teeth and gums.  Regular physical activity.  Eating a healthy diet.  Avoiding tobacco and drug use.  Limiting alcohol use.  Practicing safe sex.  Taking low-dose aspirin every day.  Taking vitamin and mineral supplements as recommended by your health care provider. What happens during an annual well check? The services and screenings done by your health care provider during your annual well check will depend on your age, overall health, lifestyle risk factors, and family history of disease. Counseling  Your health care provider may ask you questions about your:  Alcohol use.  Tobacco use.  Drug use.  Emotional well-being.  Home and relationship  well-being.  Sexual activity.  Eating habits.  History of falls.  Memory and ability to understand (cognition).  Work and work Statistician.  Reproductive health. Screening  You may have the following tests or measurements:  Height, weight, and BMI.  Blood pressure.  Lipid and cholesterol levels. These may be checked every 5 years, or more frequently if you are over 57 years old.  Skin check.  Lung cancer screening. You may have this screening every year starting at age 58 if you have a 30-pack-year history of smoking and currently smoke or have quit within the past 15 years.  Fecal occult blood test (FOBT) of the stool. You may have this test every year starting at age 63.  Flexible sigmoidoscopy or colonoscopy. You may have a sigmoidoscopy every 5 years or a colonoscopy every 10 years starting at age 64.  Hepatitis C blood test.  Hepatitis B blood test.  Sexually transmitted disease (STD) testing.  Diabetes screening. This is done by checking your blood sugar (glucose) after you have not eaten for a while (fasting). You may have this done every 1-3 years.  Bone density scan. This is done to screen for osteoporosis. You may have this done starting at age 85.  Mammogram. This may be done every 1-2 years. Talk to your health care provider about how often you should have regular mammograms. Talk with your health care provider about your test results, treatment options, and if necessary, the need for more tests. Vaccines  Your health care provider may recommend certain vaccines, such as:  Influenza vaccine. This is recommended every year.  Tetanus, diphtheria, and acellular pertussis (Tdap, Td) vaccine. You  may need a Td booster every 10 years.  Zoster vaccine. You may need this after age 64.  Pneumococcal 13-valent conjugate (PCV13) vaccine. One dose is recommended after age 9.  Pneumococcal polysaccharide (PPSV23) vaccine. One dose is recommended after age  27. Talk to your health care provider about which screenings and vaccines you need and how often you need them. This information is not intended to replace advice given to you by your health care provider. Make sure you discuss any questions you have with your health care provider. Document Released: 06/08/2015 Document Revised: 01/30/2016 Document Reviewed: 03/13/2015 Elsevier Interactive Patient Education  2017 [place] Prevention in the Home Falls can cause injuries. They can happen to people of all ages. There are many things you can do to make your home safe and to help prevent falls. What can I do on the outside of my home?  Regularly fix the edges of walkways and driveways and fix any cracks.  Remove anything that might make you trip as you walk through a door, such as a raised step or threshold.  Trim any bushes or trees on the path to your home.  Use bright outdoor lighting.  Clear any walking paths of anything that might make someone trip, such as rocks or tools.  Regularly check to see if handrails are loose or broken. Make sure that both sides of any steps have handrails.  Any raised decks and porches should have guardrails on the edges.  Have any leaves, snow, or ice cleared regularly.  Use sand or salt on walking paths during winter.  Clean up any spills in your garage right away. This includes oil or grease spills. What can I do in the bathroom?  Use night lights.  Install grab bars by the toilet and in the tub and shower. Do not use towel bars as grab bars.  Use non-skid mats or decals in the tub or shower.  If you need to sit down in the shower, use a plastic, non-slip stool.  Keep the floor dry. Clean up any water that spills on the floor as soon as it happens.  Remove soap buildup in the tub or shower regularly.  Attach bath mats securely with double-sided non-slip rug tape.  Do not have throw rugs and other things on the floor that can make  you trip. What can I do in the bedroom?  Use night lights.  Make sure that you have a light by your bed that is easy to reach.  Do not use any sheets or blankets that are too big for your bed. They should not hang down onto the floor.  Have a firm chair that has side arms. You can use this for support while you get dressed.  Do not have throw rugs and other things on the floor that can make you trip. What can I do in the kitchen?  Clean up any spills right away.  Avoid walking on wet floors.  Keep items that you use a lot in easy-to-reach places.  If you need to reach something above you, use a strong step stool that has a grab bar.  Keep electrical cords out of the way.  Do not use floor polish or wax that makes floors slippery. If you must use wax, use non-skid floor wax.  Do not have throw rugs and other things on the floor that can make you trip. What can I do with my stairs?  Do not leave any items  on the stairs.  Make sure that there are handrails on both sides of the stairs and use them. Fix handrails that are broken or loose. Make sure that handrails are as long as the stairways.  Check any carpeting to make sure that it is firmly attached to the stairs. Fix any carpet that is loose or worn.  Avoid having throw rugs at the top or bottom of the stairs. If you do have throw rugs, attach them to the floor with carpet tape.  Make sure that you have a light switch at the top of the stairs and the bottom of the stairs. If you do not have them, ask someone to add them for you. What else can I do to help prevent falls?  Wear shoes that:  Do not have high heels.  Have rubber bottoms.  Are comfortable and fit you well.  Are closed at the toe. Do not wear sandals.  If you use a stepladder:  Make sure that it is fully opened. Do not climb a closed stepladder.  Make sure that both sides of the stepladder are locked into place.  Ask someone to hold it for you, if  possible.  Clearly mark and make sure that you can see:  Any grab bars or handrails.  First and last steps.  Where the edge of each step is.  Use tools that help you move around (mobility aids) if they are needed. These include:  Canes.  Walkers.  Scooters.  Crutches.  Turn on the lights when you go into a dark area. Replace any light bulbs as soon as they burn out.  Set up your furniture so you have a clear path. Avoid moving your furniture around.  If any of your floors are uneven, fix them.  If there are any pets around you, be aware of where they are.  Review your medicines with your doctor. Some medicines can make you feel dizzy. This can increase your chance of falling. Ask your doctor what other things that you can do to help prevent falls. This information is not intended to replace advice given to you by your health care provider. Make sure you discuss any questions you have with your health care provider. Document Released: 03/08/2009 Document Revised: 10/18/2015 Document Reviewed: 06/16/2014 Elsevier Interactive Patient Education  2017 [employer].

## 2016-11-09 ENCOUNTER — Encounter: Payer: Self-pay | Admitting: Internal Medicine

## 2016-11-09 NOTE — Assessment & Plan Note (Signed)
Hb 8.4, which is stable from prior which is fine but pt is supposed to be on iron but is  Not;will try again iron 325 mg daily

## 2016-11-09 NOTE — Assessment & Plan Note (Signed)
At times along with the R hip pain ; will cont pain clinic recommendations --> MS Contin 15 mg q 12  And oxycodone 5 mg 5X daily

## 2016-11-09 NOTE — Assessment & Plan Note (Signed)
Chronic and stable and followed by pain clinic for same; plan to cont pain clinic regimen--> MS Contin 15 mg q 12 and oxycodone 5 mg 5 X daily

## 2016-11-10 LAB — CBC AND DIFFERENTIAL
HEMATOCRIT: 27 — AB (ref 36–46)
Hemoglobin: 8.1 — AB (ref 12.0–16.0)
PLATELETS: 322 (ref 150–399)
WBC: 8.7

## 2016-11-11 ENCOUNTER — Encounter: Payer: Self-pay | Admitting: Internal Medicine

## 2016-11-11 ENCOUNTER — Non-Acute Institutional Stay (SKILLED_NURSING_FACILITY): Payer: Medicare Other | Admitting: Internal Medicine

## 2016-11-11 DIAGNOSIS — K5901 Slow transit constipation: Secondary | ICD-10-CM | POA: Diagnosis not present

## 2016-11-11 DIAGNOSIS — I5032 Chronic diastolic (congestive) heart failure: Secondary | ICD-10-CM | POA: Diagnosis not present

## 2016-11-11 DIAGNOSIS — I10 Essential (primary) hypertension: Secondary | ICD-10-CM

## 2016-11-11 NOTE — Progress Notes (Signed)
Location:  [facility] Room Number: [place]:  SNF (31)  [name], MD  Patient Care Team: [name], MD as PCP - General (Internal Medicine) [name], MD as Consulting Physician (Orthopedic Surgery) [name], MD as Consulting Physician (Otolaryngology)  Extended Emergency Contact Information Primary Emergency Contact: [name] Address: [place], [place] [address] [name] of [place] Phone: [phone] Relation: Daughter Secondary Emergency Contact: [place], [place] Mobile Phone: [phone] Relation: Son    Allergies: Amoxicillin; Baclofen; Hctz [hydrochlorothiazide]; and Valium [diazepam]  Chief Complaint  Patient presents with  . Medical Management of Chronic Issues    routine    HPI: Patient is 83 y.o. female who Is being seen for routine issues of hypertension, chronic congestive heart failure, and constipation.  Past Medical History:  Diagnosis Date  . Abnormal stress test 08/26/2013  . Acute encephalopathy   . AKI (acute kidney injury) ([place])   . Altered mental state   . Anemia   . Anxiety disorder, unspecified   . Aortic stenosis   . Arthritis    Back, knees  . Avascular necrosis of bone of right hip ([place]) 09/27/2013  . Bowel incontinence   . BPPV (benign paroxysmal positional vertigo) 09/07/2013  . Closed fracture of pubic ramus ([place]) 09/07/2013  . Colon polyps   . Constipation   . Decreased hearing of both ears 08/19/2016   Last Assessment & Plan:  Concern over hearing loss. Slowly progressive hearing loss.  No history of ear surgery, trauma or infection. EXAMINATION shows normal external canals and tympanic membranes. AUDIOGRAM severe sensorineural hearing loss.  Symmetric in nature. PLAN: Reassured I do not see anything bad going on.  She is a candidate for hearing aids.   will check her benefits.  .  Gastric ulcer    years ago  . History of anemia 04/17/2012  . History of TIAs 04/17/2012   June 2011   . Hypertension   . Hypokalemia   . Pelvic fracture ([place]) 08/28/2013  . Personal history of colonic polyps 10/08/2011   Index polypectomy 2013   . Restless legs syndrome    on Requip  . S/P total hysterectomy and bilateral salpingo-oophorectomy 04/17/2012  . Situational stress   . Spinal stenosis   . Spinal stenosis, lumbar 04/17/2012  . Spinal stenosis, lumbar region, with neurogenic claudication 09/07/2013  . Stroke ([place])    Mini, no residual  . TIA (transient ischemic attack)   . Ulcer causing bleeding and hole in wall of stomach or small intestine   . Urinary bladder calculus   . Urinary incontinence   . Vertigo     Past Surgical History:  Procedure Laterality Date  . COLON RESECTION  50 years ago  . LUMBAR LAMINECTOMY/DECOMPRESSION MICRODISCECTOMY  06/25/2011   Procedure: LUMBAR LAMINECTOMY/DECOMPRESSION MICRODISCECTOMY;  Surgeon: Hosie Spangle, MD;  Location: [facility];  Service: Neurosurgery;  Laterality: Right;  RIGHT Lumbar Two-Three hemilaminectomy and microdiskectomy  . TONSILLECTOMY    . UTERINE FIBROID SURGERY    . VAGINAL HYSTERECTOMY      Allergies as of 11/11/2016      Reactions   Amoxicillin Other (See Comments)   Reaction unknown   Baclofen Other (See Comments)   "fuzzy in the eyes" and dizzy   Hctz [hydrochlorothiazide]  nausea   Valium [diazepam] Other (See Comments)   Pt became unresponsive and O2 sats dropped.      Medication List       Accurate as of 11/11/16 11:59 PM. Always use your most recent med list.          acetaminophen 325 MG tablet Commonly known as:  TYLENOL Take 650 mg by mouth every 6 (six) hours as needed. Notify MD if not relieved. Not to exceed 3000 mg in 24 hour period   amLODipine 5 MG tablet Commonly known as:  NORVASC Take 5 mg by mouth daily.   CHLORASEPTIC MAX SORE THROAT 1.5-33 % Liqd Generic drug:   Phenol-Glycerin Use as directed 1 spray in the mouth or throat 3 (three) times daily as needed. For throat irritation/pain. If sore throat persist notify the MD   ENSURE Take 237 mLs by mouth 2 (two) times daily between meals.   feeding supplement (PRO-STAT SUGAR FREE 64) Liqd Take 30 mLs by mouth 2 (two) times daily.   lubiprostone 24 MCG capsule Commonly known as:  AMITIZA Take 24 mcg by mouth 2 (two) times daily with a meal.   mirtazapine 15 MG tablet Commonly known as:  REMERON Take 15 mg by mouth at bedtime.   morphine 15 MG 12 hr tablet Commonly known as:  MS CONTIN Take 1 tablet (15 mg total) by mouth 2 (two) times daily.   multivitamin tablet Take 1 tablet by mouth daily.   MYRBETRIQ 50 MG Tb24 tablet Generic drug:  mirabegron ER Take 50 mg by mouth daily.   ondansetron 4 MG tablet Commonly known as:  ZOFRAN Take 4 mg by mouth every 12 (twelve) hours as needed for nausea or vomiting.   oxyCODONE 5 MG immediate release tablet Commonly known as:  Oxy IR/ROXICODONE Take 5 mg by mouth 5 (five) times daily as needed for severe pain.   pantoprazole 40 MG tablet Commonly known as:  PROTONIX Take 40 mg by mouth daily.   potassium chloride 10 MEQ tablet Commonly known as:  K-DUR Take 20 mEq by mouth daily. 2 tablets   rOPINIRole 4 MG tablet Commonly known as:  REQUIP Take 4 mg by mouth at bedtime.   SYSTANE BALANCE 0.6 % Soln Generic drug:  Propylene Glycol Place 1 drop into both eyes 2 (two) times daily.   torsemide 20 MG tablet Commonly known as:  DEMADEX Take 40 mg by mouth every morning. 2 tabs       No orders of the defined types were placed in this encounter.   Immunization History  Administered Date(s) Administered  . Influenza Split 04/15/2012  . Influenza,inj,Quad PF,36+ Mos 03/06/2014  . Influenza-Unspecified 03/01/2016  . PPD Test 08/30/2014, 09/18/2014  . Pneumococcal Polysaccharide-23 08/29/2013    Social History  Substance Use  Topics  . Smoking status: Never Smoker  . Smokeless tobacco: Never Used  . Alcohol use 0.0 oz/week     Comment: rarely    Review of Systems  DATA OBTAINED: from patient GENERAL:  no fevers, fatigue, appetite changes SKIN: No itching, rash HEENT: No complaint RESPIRATORY: No cough, wheezing, SOB CARDIAC: No chest pain, palpitations, lower extremity edema  GI: No abdominal pain, No N/V/D or constipation, No heartburn or reflux  GU: No dysuria, frequency or urgency, or incontinence  MUSCULOSKELETAL: No unrelieved bone/joint pain NEUROLOGIC: No headache, dizziness  PSYCHIATRIC: No overt anxiety or sadness  Vitals:   11/11/16 1107  BP: (!) 96/56  Pulse: 99  Resp: 18  Temp: 97.3 F (36.3 C)   Body mass index is 18.79 kg/m. Physical Exam  GENERAL APPEARANCE: Alert, conversant, No acute distress  SKIN: No diaphoresis rash HEENT: Unremarkable RESPIRATORY: Breathing is even, unlabored. Lung sounds are clear   CARDIOVASCULAR: Heart RRR no murmurs, rubs or gallops. 1/2+ peripheral edema  GASTROINTESTINAL: Abdomen is soft, non-tender, not distended w/ normal bowel sounds.  GENITOURINARY: Bladder non tender, not distended  MUSCULOSKELETAL: No abnormal joints or musculature NEUROLOGIC: Cranial nerves 2-12 grossly intact. Moves all extremities PSYCHIATRIC: Mood and affect appropriate to situation with some dementia, no behavioral issues  Patient Active Problem List   Diagnosis Date Noted  . Decreased hearing of both ears 08/19/2016  . Pressure injury of skin 08/15/2016  . Aspiration pneumonia ([place]) 08/14/2016  . Chronic diastolic CHF (congestive heart failure) ([place]) 08/14/2016  . Chronic right shoulder pain 02/09/2016  . Bilateral lower extremity edema 02/02/2016  . Ileitis 01/19/2016  . Pressure ulcer 01/09/2016  . SBO (small bowel obstruction) ([place]) 01/08/2016  . Narcotic addiction ([place]) 05/15/2015  . Diarrhea 03/30/2015  . Acute pharyngitis 02/26/2015  . Osteoarthritis of  right knee 02/22/2015  . Pain in joint, lower leg 10/31/2014  . Ulcer of sacral region, stage 4 ([place]) 04/15/2014  . Moderate aortic stenosis 04/14/2014  . Pre-syncope 04/14/2014  . Hypotension 04/14/2014  . Acute renal failure superimposed on stage 2 chronic kidney disease ([place]) 04/14/2014  . Anemia 04/14/2014  . Protein-calorie malnutrition, severe ([place]) 04/14/2014  . Altered mental status   . Dehydration 04/13/2014  . Altered mental state 04/13/2014  . Acute kidney injury ([place])   . Hypokalemia 03/25/2014  . Syncope 03/16/2014  . Acute encephalopathy 03/16/2014  . Leg swelling 03/02/2014  . Avascular necrosis of bone of right hip ([place]) 09/27/2013  . Primary osteoarthritis of right hip 09/07/2013  . Spinal stenosis, lumbar region, with neurogenic claudication 09/07/2013  . Closed fracture of pubic ramus ([place]) 09/07/2013  . BPPV (benign paroxysmal positional vertigo) 09/07/2013  . Pharyngeal swelling 08/30/2013  . Pelvic fracture ([place]) 08/28/2013  . Fall 08/26/2013  . Dizziness 08/26/2013  . Abnormal stress test 08/26/2013  . Preop cardiovascular exam 08/01/2013  . Aortic stenosis 08/01/2013  . Edema 06/30/2013  . Loss of weight 01/04/2013  . Situational stress 01/04/2013  . Chronic right hip pain 07/05/2012  . Knee pain, acute 06/29/2012  . Vertigo 06/24/2012  . Acute exacerbation of chronic low back pain 06/24/2012  . Lumbar pain with radiation down right leg 06/09/2012  . Osteoarthritis of hip 04/29/2012  . History of TIAs 04/17/2012  . S/P total hysterectomy and bilateral salpingo-oophorectomy 04/17/2012  . Hard of hearing 04/17/2012  . History of anemia 04/17/2012  . RLS (restless legs syndrome) 04/17/2012  . Chronic back pain 04/17/2012  . Spinal stenosis, lumbar 04/17/2012  . Lower back pain 12/20/2011  . Personal history of colonic polyps 10/08/2011  . H/O 09/14/2011  . Essential hypertension 09/14/2011  . Restless legs syndrome 09/14/2011  . Constipation  08/20/2011  . Urinary incontinence 08/20/2011    CMP     Component Value Date/Time   NA 142 08/17/2016 0343   NA 142 08/17/2016   K 3.9 08/17/2016 0343   CL 106 08/17/2016 0343   CO2 28 08/17/2016 0343   GLUCOSE 92 08/17/2016 0343   BUN 22 (H) 08/17/2016 0343   BUN 22 (A) 08/17/2016   CREATININE 1.13 (H) 08/17/2016 0343   CREATININE 0.78 06/28/2014 1205   CALCIUM 7.8 (L) 08/17/2016 0343   PROT  6.6 08/14/2016 1519   ALBUMIN 3.4 (L) 08/14/2016 1519   AST 28 08/14/2016 1519   ALT 17 08/14/2016 1519   ALKPHOS 107 08/14/2016 1519   BILITOT 1.1 08/14/2016 1519   GFRNONAA 40 (L) 08/17/2016 0343   GFRAA 46 (L) 08/17/2016 0343    Recent Labs  01/10/16 0838  08/15/16 0531 08/16/16 08/16/16 0457 08/17/16 08/17/16 0343  NA 143  < > 140 147 147* 142 142  K 3.3*  < > 3.3* 3.1* 3.1*  --  3.9  CL 107  < > 94*  --  104  --  106  CO2 27  < > 34*  --  31  --  28  GLUCOSE 133*  < > 86  --  69  --  92  BUN 29*  < > 28* 23* 23* 22* 22*  CREATININE 0.71  < > 1.79* 1.2* 1.20* 1.1 1.13*  CALCIUM 8.8*  < > 8.0*  --  8.0*  --  7.8*  MG 2.0  --   --   --  2.2  --   --   < > = values in this interval not displayed.  Recent Labs  01/08/16 0412 03/19/16 06/04/16 08/14/16 1519  AST 21 21 16 28   ALT 15 12 6* 17  ALKPHOS 109 104 109 107  BILITOT 0.5  --   --  1.1  PROT 7.6  --   --  6.6  ALBUMIN 4.4  --   --  3.4*    Recent Labs  01/08/16 0412  08/14/16 1519  08/15/16 0531  08/16/16 0457 09/04/16 09/08/16 10/08/16 11/10/16  WBC 26.4*  < > 21.1*  < > 16.1*  < > 11.4* 6.5 6.7 8.0 8.7  NEUTROABS 24.0*  --  19.6*  --   --   --   --   --   --   --   --   HGB 12.2  < > 9.4*  < > 8.5*  --  8.3* 8.6* 8.4* 8.1* 8.1*  HCT 39.2  < > 31.3*  < > 28.2*  --  27.8* 29* 26*  --  27*  MCV 86.5  < > 81.7  --  82.5  --  83.5  --   --   --   --   PLT 336  < > 300  < > 256  --  261 311 299 355 322  < > = values in this interval not displayed.  Recent Labs  03/19/16  CHOL 141  LDLCALC 71  TRIG 77     No results found for: [facility] Lab Results  Component Value Date   TSH 2.46 04/26/2016   Lab Results  Component Value Date   HGBA1C 5.6 03/19/2016   Lab Results  Component Value Date   CHOL 141 03/19/2016   HDL 55 03/19/2016   LDLCALC 71 03/19/2016   TRIG 77 03/19/2016   CHOLHDL 2.8 03/06/2014    Significant Diagnostic Results in last 30 days:  No results found.  Assessment and Plan  Essential hypertension We'll controlled; continue Norvasc 5 mg by mouth daily and Demadex 40 mg by mouth daily  Chronic diastolic CHF (congestive heart failure) (H[facility]) Chronic and stable without exacerbation for very long time; plan to continue Demadex 40 mg by mouth daily  Constipation Has been stable since patient's been on amitiza 24 mg by mouth twice a day; plan to continue current med    Webb Silversmith D. [name], MD

## 2016-12-01 ENCOUNTER — Encounter: Payer: Self-pay | Admitting: Internal Medicine

## 2016-12-09 ENCOUNTER — Non-Acute Institutional Stay (SKILLED_NURSING_FACILITY): Payer: Medicare Other | Admitting: Internal Medicine

## 2016-12-09 DIAGNOSIS — G2581 Restless legs syndrome: Secondary | ICD-10-CM

## 2016-12-09 DIAGNOSIS — M25511 Pain in right shoulder: Secondary | ICD-10-CM | POA: Diagnosis not present

## 2016-12-09 DIAGNOSIS — R600 Localized edema: Secondary | ICD-10-CM

## 2016-12-09 DIAGNOSIS — G8929 Other chronic pain: Secondary | ICD-10-CM | POA: Diagnosis not present

## 2016-12-09 NOTE — Progress Notes (Addendum)
Location:  [facility] Room Number: 094B Place of Service:  SNF (31)  [name], MD  Patient Care Team: [name], MD as PCP - General (Internal Medicine) [name], MD as Consulting Physician (Orthopedic Surgery) [name], MD as Consulting Physician (Otolaryngology) Mast, Man X, NP as Nurse Practitioner (Internal Medicine)  Extended Emergency Contact Information Primary Emergency Contact: [name] Address: [place], [place] [address] [name] of [place] Phone: [phone] Relation: Daughter Secondary Emergency Contact: [place], [place] of [place] Mobile Phone: [phone] Relation: Son    Allergies: Amoxicillin; Baclofen; Hctz [hydrochlorothiazide]; and Valium [diazepam]  Chief Complaint  Patient presents with  . Medical Management of Chronic Issues    routine visit    HPI: Patient is 83 y.o. female who Is being seen for routine issues of restless leg syndrome, chronic right shoulder pain, and bilateral lower extremity edema.  Past Medical History:  Diagnosis Date  . Abnormal stress test 08/26/2013  . Acute encephalopathy   . AKI (acute kidney injury) ([place])   . Altered mental state   . Anemia   . Anxiety disorder, unspecified   . Aortic stenosis   . Arthritis    Back, knees  . Avascular necrosis of bone of right hip ([place]) 09/27/2013  . Bowel incontinence   . BPPV (benign paroxysmal positional vertigo) 09/07/2013  . Closed fracture of pubic ramus ([place]) 09/07/2013  . Colon polyps   . Constipation   . Decreased hearing of both ears 08/19/2016   Last Assessment & Plan:  Concern over hearing loss. Slowly progressive hearing loss.  No history of ear surgery, trauma or infection. EXAMINATION shows normal external canals and tympanic membranes. AUDIOGRAM severe sensorineural hearing loss.  Symmetric in nature. PLAN: Reassured I do not see  anything bad going on.  She is a candidate for hearing aids.   will check her benefits.  . Gastric ulcer    years ago  . History of anemia 04/17/2012  . History of TIAs 04/17/2012   June 2011   . Hypertension   . Hypokalemia   . Pelvic fracture ([place]) 08/28/2013  . Personal history of colonic polyps 10/08/2011   Index polypectomy 2013   . Restless legs syndrome    on Requip  . S/P total hysterectomy and bilateral salpingo-oophorectomy 04/17/2012  . Situational stress   . Spinal stenosis   . Spinal stenosis, lumbar 04/17/2012  . Spinal stenosis, lumbar region, with neurogenic claudication 09/07/2013  . Stroke ([place])    Mini, no residual  . TIA (transient ischemic attack)   . Ulcer causing bleeding and hole in wall of stomach or small intestine   . Urinary bladder calculus   . Urinary incontinence   . Vertigo     Past Surgical History:  Procedure Laterality Date  . COLON RESECTION  50 years ago  . LUMBAR LAMINECTOMY/DECOMPRESSION MICRODISCECTOMY  06/25/2011   Procedure: LUMBAR LAMINECTOMY/DECOMPRESSION MICRODISCECTOMY;  Surgeon: Hosie Spangle, MD;  Location: [facility];  Service: Neurosurgery;  Laterality: Right;  RIGHT Lumbar Two-Three hemilaminectomy and microdiskectomy  . TONSILLECTOMY    . UTERINE FIBROID SURGERY    . VAGINAL HYSTERECTOMY      Allergies as of 12/09/2016      Reactions   Amoxicillin Other (See Comments)   Reaction unknown   Baclofen Other (See Comments)   "  fuzzy in the eyes" and dizzy   Hctz [hydrochlorothiazide]    nausea   Valium [diazepam] Other (See Comments)   Pt became unresponsive and O2 sats dropped.      Medication List       Accurate as of 12/09/16 11:59 PM. Always use your most recent med list.          acetaminophen 325 MG tablet Commonly known as:  TYLENOL Take 325 mg by mouth every 6 (six) hours as needed. Notify MD if not relieved. Not to exceed 3000 mg in 24 hour period   amLODipine 5 MG tablet Commonly known as:   NORVASC Take 5 mg by mouth daily.   CHLORASEPTIC MAX SORE THROAT 1.5-33 % Liqd Generic drug:  Phenol-Glycerin Use as directed 1 spray in the mouth or throat 3 (three) times daily as needed. For throat irritation/pain. If sore throat persist notify the MD   ENSURE Take 237 mLs by mouth 2 (two) times daily between meals.   feeding supplement (PRO-STAT SUGAR FREE 64) Liqd Take 30 mLs by mouth 2 (two) times daily.   lubiprostone 24 MCG capsule Commonly known as:  AMITIZA Take 24 mcg by mouth 2 (two) times daily with a meal.   mirtazapine 15 MG tablet Commonly known as:  REMERON Take 15 mg by mouth at bedtime.   morphine 15 MG 12 hr tablet Commonly known as:  MS CONTIN Take 1 tablet (15 mg total) by mouth 2 (two) times daily.   multivitamin tablet Take 1 tablet by mouth daily.   MYRBETRIQ 50 MG Tb24 tablet Generic drug:  mirabegron ER Take 50 mg by mouth daily.   ondansetron 4 MG tablet Commonly known as:  ZOFRAN Take 4 mg by mouth every 12 (twelve) hours as needed for nausea or vomiting.   oxyCODONE 5 MG immediate release tablet Commonly known as:  Oxy IR/ROXICODONE Take 5 mg by mouth 5 (five) times daily as needed for severe pain.   pantoprazole 40 MG tablet Commonly known as:  PROTONIX Take 40 mg by mouth daily.   potassium chloride 10 MEQ tablet Commonly known as:  K-DUR Take 20 mEq by mouth daily. 2 tablets   rOPINIRole 4 MG tablet Commonly known as:  REQUIP Take 4 mg by mouth at bedtime.   SYSTANE BALANCE 0.6 % Soln Generic drug:  Propylene Glycol Place 1 drop into both eyes 2 (two) times daily.   torsemide 20 MG tablet Commonly known as:  DEMADEX Take 60 mg by mouth every Monday, Wednesday, and Friday. Lydia Guiles, Thurs, Sat take 40 mg.       No orders of the defined types were placed in this encounter.   Immunization History  Administered Date(s) Administered  . Influenza Split 04/15/2012  . Influenza,inj,Quad PF,36+ Mos 03/06/2014  .  Influenza-Unspecified 03/01/2016  . PPD Test 08/30/2014, 09/18/2014  . Pneumococcal Polysaccharide-23 08/29/2013    Social History  Substance Use Topics  . Smoking status: Never Smoker  . Smokeless tobacco: Never Used  . Alcohol use 0.0 oz/week     Comment: rarely    Review of Systems  DATA OBTAINED: from patient, nurse GENERAL:  no fevers, fatigue, appetite changes SKIN: No itching, rash HEENT: No complaint RESPIRATORY: No cough, wheezing, SOB CARDIAC: No chest pain, palpitations, lower extremity edema  GI: No abdominal pain, No N/V/D or constipation, No heartburn or reflux  GU: No dysuria, frequency or urgency, or incontinence  MUSCULOSKELETAL: No unrelieved bone/joint pain NEUROLOGIC: No headache, dizziness  PSYCHIATRIC:  No overt anxiety or sadness  Vitals:   12/09/16 1510  BP: (!) 96/56  Pulse: 90  Resp: 18  Temp: (!) 97.3 F (36.3 C)   Body mass index is 18.75 kg/m. Physical Exam  GENERAL APPEARANCE: Alert, conversant, No acute distress  SKIN: No diaphoresis rash HEENT: Unremarkable RESPIRATORY: Breathing is even, unlabored. Lung sounds are clear   CARDIOVASCULAR: Heart RRR 3/6 systoliv murmur, no  rubs or gallops. + peripheral edema  GASTROINTESTINAL: Abdomen is soft, non-tender, not distended w/ normal bowel sounds.  GENITOURINARY: Bladder non tender, not distended  MUSCULOSKELETAL: No abnormal joints or musculature NEUROLOGIC: Cranial nerves 2-12 grossly intact. Moves all extremities PSYCHIATRIC: Mood and affect With dementia and more anxiety as her baseline now, no behavioral issues  Patient Active Problem List   Diagnosis Date Noted  . GERD (gastroesophageal reflux disease) 12/22/2016  . Decreased hearing of both ears 08/19/2016  . Pressure injury of skin 08/15/2016  . Aspiration pneumonia ([place]) 08/14/2016  . Chronic diastolic CHF (congestive heart failure) ([place]) 08/14/2016  . Chronic right shoulder pain 02/09/2016  . Bilateral lower extremity edema  02/02/2016  . Ileitis 01/19/2016  . Pressure ulcer 01/09/2016  . SBO (small bowel obstruction) ([place]) 01/08/2016  . Narcotic addiction ([place]) 05/15/2015  . Diarrhea 03/30/2015  . Acute pharyngitis 02/26/2015  . Osteoarthritis of right knee 02/22/2015  . Pain in joint, lower leg 10/31/2014  . Ulcer of sacral region, stage 4 ([place]) 04/15/2014  . Moderate aortic stenosis 04/14/2014  . Pre-syncope 04/14/2014  . Hypotension 04/14/2014  . Acute renal failure superimposed on stage 2 chronic kidney disease ([place]) 04/14/2014  . Anemia 04/14/2014  . Protein-calorie malnutrition, severe ([place]) 04/14/2014  . Altered mental status   . Dehydration 04/13/2014  . Altered mental state 04/13/2014  . Acute kidney injury ([place])   . Hypokalemia 03/25/2014  . Syncope 03/16/2014  . Acute encephalopathy 03/16/2014  . Leg swelling 03/02/2014  . Avascular necrosis of bone of right hip ([place]) 09/27/2013  . Primary osteoarthritis of right hip 09/07/2013  . Spinal stenosis, lumbar region, with neurogenic claudication 09/07/2013  . Closed fracture of pubic ramus ([place]) 09/07/2013  . BPPV (benign paroxysmal positional vertigo) 09/07/2013  . Pharyngeal swelling 08/30/2013  . Pelvic fracture ([place]) 08/28/2013  . Fall 08/26/2013  . Dizziness 08/26/2013  . Abnormal stress test 08/26/2013  . Preop cardiovascular exam 08/01/2013  . Aortic stenosis 08/01/2013  . Edema 06/30/2013  . Loss of weight 01/04/2013  . Situational stress 01/04/2013  . Chronic right hip pain 07/05/2012  . Knee pain, acute 06/29/2012  . Vertigo 06/24/2012  . Acute exacerbation of chronic low back pain 06/24/2012  . Lumbar pain with radiation down right leg 06/09/2012  . Osteoarthritis of hip 04/29/2012  . History of TIAs 04/17/2012  . S/P total hysterectomy and bilateral salpingo-oophorectomy 04/17/2012  . Hard of hearing 04/17/2012  . History of anemia 04/17/2012  . RLS (restless legs syndrome) 04/17/2012  . Chronic back pain 04/17/2012    . Spinal stenosis, lumbar 04/17/2012  . Lower back pain 12/20/2011  . Personal history of colonic polyps 10/08/2011  . H/O 09/14/2011  . Essential hypertension 09/14/2011  . Restless legs syndrome 09/14/2011  . Constipation 08/20/2011  . Urinary incontinence 08/20/2011    CMP     Component Value Date/Time   NA 142 08/17/2016 0343   NA 142 08/17/2016   K 3.9 08/17/2016 0343   CL 106 08/17/2016 0343   CO2 28 08/17/2016 0343   GLUCOSE 92 08/17/2016  0343   BUN 22 (H) 08/17/2016 0343   BUN 22 (A) 08/17/2016   CREATININE 1.13 (H) 08/17/2016 0343   CREATININE 0.78 06/28/2014 1205   CALCIUM 7.8 (L) 08/17/2016 0343   PROT 6.6 08/14/2016 1519   ALBUMIN 3.4 (L) 08/14/2016 1519   AST 28 08/14/2016 1519   ALT 17 08/14/2016 1519   ALKPHOS 107 08/14/2016 1519   BILITOT 1.1 08/14/2016 1519   GFRNONAA 40 (L) 08/17/2016 0343   GFRAA 46 (L) 08/17/2016 0343    Recent Labs  01/10/16 0838  08/15/16 0531 08/16/16 08/16/16 0457 08/17/16 08/17/16 0343  NA 143  < > 140 147 147* 142 142  K 3.3*  < > 3.3* 3.1* 3.1*  --  3.9  CL 107  < > 94*  --  104  --  106  CO2 27  < > 34*  --  31  --  28  GLUCOSE 133*  < > 86  --  69  --  92  BUN 29*  < > 28* 23* 23* 22* 22*  CREATININE 0.71  < > 1.79* 1.2* 1.20* 1.1 1.13*  CALCIUM 8.8*  < > 8.0*  --  8.0*  --  7.8*  MG 2.0  --   --   --  2.2  --   --   < > = values in this interval not displayed.  Recent Labs  03/19/16 06/04/16 08/14/16 1519  AST 21 16 28   ALT 12 6* 17  ALKPHOS 104 109 107  BILITOT  --   --  1.1  PROT  --   --  6.6  ALBUMIN  --   --  3.4*    Recent Labs  08/14/16 1519  08/15/16 0531  08/16/16 0457  11/10/16 12/22/16 12/23/16  WBC 21.1*  < > 16.1*  < > 11.4*  < > 8.7 6.5 6.6  NEUTROABS 19.6*  --   --   --   --   --   --   --   --   HGB 9.4*  < > 8.5*  --  8.3*  < > 8.1* 7.5* 7.5*  HCT 31.3*  < > 28.2*  --  27.8*  < > 27* 24* 24*  MCV 81.7  --  82.5  --  83.5  --   --   --   --   PLT 300  < > 256  --  261  < > 322 287  273  < > = values in this interval not displayed.  Recent Labs  03/19/16  CHOL 141  LDLCALC 71  TRIG 77   No results found for: [facility] Lab Results  Component Value Date   TSH 2.46 04/26/2016   Lab Results  Component Value Date   HGBA1C 5.6 03/19/2016   Lab Results  Component Value Date   CHOL 141 03/19/2016   HDL 55 03/19/2016   LDLCALC 71 03/19/2016   TRIG 77 03/19/2016   CHOLHDL 2.8 03/06/2014    Significant Diagnostic Results in last 30 days:  No results found.  Assessment and Plan  RLS (restless legs syndrome) Controlled; continue Requip 4 mg by mouth daily at bedtime  Bilateral lower extremity edema Chronic, waxes and wanes; continues to be less swollen than prior; plan to continue Demadex 40 mg daily    Kyleen Villatoro D. [name], MD

## 2016-12-10 ENCOUNTER — Encounter: Payer: Self-pay | Admitting: Internal Medicine

## 2016-12-10 NOTE — Progress Notes (Signed)
Location:  [facility] Service:  SNF (31)  [name], MD  Patient Care Team: [name], MD as PCP - General (Internal Medicine) [name], MD as Consulting Physician (Orthopedic Surgery) [name], MD as Consulting Physician (Otolaryngology)  Extended Emergency Contact Information Primary Emergency Contact: [name] Address: [place], [place] [address] [name] of [place] Phone: [phone] Relation: Daughter Secondary Emergency Contact: [place], [place] of [place] Mobile Phone: [phone] Relation: Son    Allergies: Amoxicillin; Baclofen; Hctz [hydrochlorothiazide]; and Valium [diazepam]  Chief Complaint  Patient presents with  . Acute Visit    HPI: Patient is 83 y.o. female who Who asked to see me today because of complaint of diarrhea all night long. Patient denies that she had any nausea. Patient says she ate breakfast this morning and has been fine today without diarrhea. Per nursing there is no documentation for diarrhea last night. Daytime nursing who knows her very well feels that she either cannot feel her bowel movement or just forgets what went on or remember something when on some other time and this is not impossible. Patient has had no fever or any other systemic symptom.  Past Medical History:  Diagnosis Date  . Abnormal stress test 08/26/2013  . Acute encephalopathy   . AKI (acute kidney injury) ([place])   . Altered mental state   . Anemia   . Anxiety disorder, unspecified   . Aortic stenosis   . Arthritis    Back, knees  . Avascular necrosis of bone of right hip ([place]) 09/27/2013  . Bowel incontinence   . BPPV (benign paroxysmal positional vertigo) 09/07/2013  . Closed fracture of pubic ramus ([place]) 09/07/2013  . Colon polyps   . Constipation   . Decreased hearing of both ears 08/19/2016   Last Assessment & Plan:  Concern over  hearing loss. Slowly progressive hearing loss.  No history of ear surgery, trauma or infection. EXAMINATION shows normal external canals and tympanic membranes. AUDIOGRAM severe sensorineural hearing loss.  Symmetric in nature. PLAN: Reassured I do not see anything bad going on.  She is a candidate for hearing aids.   will check her benefits.  . Gastric ulcer    years ago  . History of anemia 04/17/2012  . History of TIAs 04/17/2012   June 2011   . Hypertension   . Hypokalemia   . Pelvic fracture ([place]) 08/28/2013  . Personal history of colonic polyps 10/08/2011   Index polypectomy 2013   . Restless legs syndrome    on Requip  . S/P total hysterectomy and bilateral salpingo-oophorectomy 04/17/2012  . Situational stress   . Spinal stenosis   . Spinal stenosis, lumbar 04/17/2012  . Spinal stenosis, lumbar region, with neurogenic claudication 09/07/2013  . Stroke ([place])    Mini, no residual  . TIA (transient ischemic attack)   . Ulcer causing bleeding and hole in wall of stomach or small intestine   . Urinary bladder calculus   . Urinary incontinence   . Vertigo     Past Surgical History:  Procedure Laterality Date  . COLON RESECTION  50 years ago  . LUMBAR LAMINECTOMY/DECOMPRESSION MICRODISCECTOMY  06/25/2011   Procedure: LUMBAR LAMINECTOMY/DECOMPRESSION MICRODISCECTOMY;  Surgeon: Hosie Spangle, MD;  Location: [facility];  Service: Neurosurgery;  Laterality: Right;  RIGHT Lumbar  Two-Three hemilaminectomy and microdiskectomy  . TONSILLECTOMY    . UTERINE FIBROID SURGERY    . VAGINAL HYSTERECTOMY      Allergies as of 11/05/2016      Reactions   Amoxicillin Other (See Comments)   Reaction unknown   Baclofen Other (See Comments)   "fuzzy in the eyes" and dizzy   Hctz [hydrochlorothiazide]    nausea   Valium [diazepam] Other (See Comments)   Pt became unresponsive and O2 sats dropped.      Medication List       Accurate as of 11/05/16 11:59 PM. Always use your most recent  med list.          acetaminophen 325 MG tablet Commonly known as:  TYLENOL Take 650 mg by mouth every 6 (six) hours as needed. Notify MD if not relieved. Not to exceed 3000 mg in 24 hour period   amLODipine 5 MG tablet Commonly known as:  NORVASC Take 5 mg by mouth daily.   CHLORASEPTIC MAX SORE THROAT 1.5-33 % Liqd Generic drug:  Phenol-Glycerin Use as directed 1 spray in the mouth or throat 3 (three) times daily as needed. For throat irritation/pain. If sore throat persist notify the MD   ENSURE Take 237 mLs by mouth 2 (two) times daily between meals.   feeding supplement (PRO-STAT SUGAR FREE 64) Liqd Take 30 mLs by mouth 2 (two) times daily.   lubiprostone 24 MCG capsule Commonly known as:  AMITIZA Take 24 mcg by mouth 2 (two) times daily with a meal.   mirtazapine 15 MG tablet Commonly known as:  REMERON Take 15 mg by mouth at bedtime.   morphine 15 MG 12 hr tablet Commonly known as:  MS CONTIN Take 1 tablet (15 mg total) by mouth 2 (two) times daily.   multivitamin tablet Take 1 tablet by mouth daily.   MYRBETRIQ 50 MG Tb24 tablet Generic drug:  mirabegron ER Take 50 mg by mouth daily.   ondansetron 4 MG tablet Commonly known as:  ZOFRAN Take 4 mg by mouth every 12 (twelve) hours as needed for nausea or vomiting.   pantoprazole 40 MG tablet Commonly known as:  PROTONIX Take 40 mg by mouth daily.   potassium chloride 10 MEQ tablet Commonly known as:  K-DUR Take 20 mEq by mouth daily. 2 tablets   rOPINIRole 4 MG tablet Commonly known as:  REQUIP Take 4 mg by mouth at bedtime.   SYSTANE BALANCE 0.6 % Soln Generic drug:  Propylene Glycol Place 1 drop into both eyes 2 (two) times daily.   torsemide 20 MG tablet Commonly known as:  DEMADEX Take 40 mg by mouth every morning. 2 tabs       No orders of the defined types were placed in this encounter.   Immunization History  Administered Date(s) Administered  . Influenza Split 04/15/2012  .  Influenza,inj,Quad PF,36+ Mos 03/06/2014  . Influenza-Unspecified 03/01/2016  . PPD Test 08/30/2014, 09/18/2014  . Pneumococcal Polysaccharide-23 08/29/2013    Social History  Substance Use Topics  . Smoking status: Never Smoker  . Smokeless tobacco: Never Used  . Alcohol use 0.0 oz/week     Comment: rarely    Review of Systems  DATA OBTAINED: from patient, nurse-As per history of present illness GENERAL:  no fevers, fatigue, appetite changes SKIN: No itching, rash HEENT: No complaint RESPIRATORY: No cough, wheezing, SOB CARDIAC: No chest pain, palpitations, lower extremity edema  GI: No abdominal pain, No N/V; +D no constipation, No heartburn  or reflux  GU: No dysuria, frequency or urgency, or incontinence  MUSCULOSKELETAL: No unrelieved bone/joint pain NEUROLOGIC: No headache, dizziness  PSYCHIATRIC: No overt anxiety or sadness  Vitals:   12/10/16 1449  BP: (!) 108/58  Pulse: 80  Resp: 16  Temp: 97.9 F (36.6 C)   There is no height or weight on file to calculate BMI. Physical Exam  GENERAL APPEARANCE: Alert, conversant, No acute distress  SKIN: No diaphoresis rash HEENT: Unremarkable RESPIRATORY: Breathing is even, unlabored. Lung sounds are clear   CARDIOVASCULAR: Heart RRR no murmurs, rubs or gallops. No peripheral edema  GASTROINTESTINAL: Abdomen is soft, non-tender, not distended w/ normal bowel sounds.  GENITOURINARY: Bladder non tender, not distended  MUSCULOSKELETAL: No abnormal joints or musculature NEUROLOGIC: Cranial nerves 2-12 grossly intact. Moves all extremities PSYCHIATRIC: Mood and affect appropriate to situationWith some dementia, no behavioral issues  Patient Active Problem List   Diagnosis Date Noted  . Decreased hearing of both ears 08/19/2016  . Pressure injury of skin 08/15/2016  . Aspiration pneumonia ([place]) 08/14/2016  . Chronic diastolic CHF (congestive heart failure) ([place]) 08/14/2016  . Chronic right shoulder pain 02/09/2016  .  Bilateral lower extremity edema 02/02/2016  . Ileitis 01/19/2016  . Pressure ulcer 01/09/2016  . SBO (small bowel obstruction) ([place]) 01/08/2016  . Narcotic addiction ([place]) 05/15/2015  . Diarrhea 03/30/2015  . Acute pharyngitis 02/26/2015  . Osteoarthritis of right knee 02/22/2015  . Pain in joint, lower leg 10/31/2014  . Ulcer of sacral region, stage 4 ([place]) 04/15/2014  . Moderate aortic stenosis 04/14/2014  . Pre-syncope 04/14/2014  . Hypotension 04/14/2014  . Acute renal failure superimposed on stage 2 chronic kidney disease ([place]) 04/14/2014  . Anemia 04/14/2014  . Protein-calorie malnutrition, severe ([place]) 04/14/2014  . Altered mental status   . Dehydration 04/13/2014  . Altered mental state 04/13/2014  . Acute kidney injury ([place])   . Hypokalemia 03/25/2014  . Syncope 03/16/2014  . Acute encephalopathy 03/16/2014  . Leg swelling 03/02/2014  . Avascular necrosis of bone of right hip ([place]) 09/27/2013  . Primary osteoarthritis of right hip 09/07/2013  . Spinal stenosis, lumbar region, with neurogenic claudication 09/07/2013  . Closed fracture of pubic ramus ([place]) 09/07/2013  . BPPV (benign paroxysmal positional vertigo) 09/07/2013  . Pharyngeal swelling 08/30/2013  . Pelvic fracture ([place]) 08/28/2013  . Fall 08/26/2013  . Dizziness 08/26/2013  . Abnormal stress test 08/26/2013  . Preop cardiovascular exam 08/01/2013  . Aortic stenosis 08/01/2013  . Edema 06/30/2013  . Loss of weight 01/04/2013  . Situational stress 01/04/2013  . Chronic right hip pain 07/05/2012  . Knee pain, acute 06/29/2012  . Vertigo 06/24/2012  . Acute exacerbation of chronic low back pain 06/24/2012  . Lumbar pain with radiation down right leg 06/09/2012  . Osteoarthritis of hip 04/29/2012  . History of TIAs 04/17/2012  . S/P total hysterectomy and bilateral salpingo-oophorectomy 04/17/2012  . Hard of hearing 04/17/2012  . History of anemia 04/17/2012  . RLS (restless legs syndrome) 04/17/2012  .  Chronic back pain 04/17/2012  . Spinal stenosis, lumbar 04/17/2012  . Lower back pain 12/20/2011  . Personal history of colonic polyps 10/08/2011  . H/O 09/14/2011  . Essential hypertension 09/14/2011  . Restless legs syndrome 09/14/2011  . Constipation 08/20/2011  . Urinary incontinence 08/20/2011    CMP     Component Value Date/Time   NA 142 08/17/2016 0343   NA 142 08/17/2016   K 3.9 08/17/2016 0343   CL 106 08/17/2016  0343   CO2 28 08/17/2016 0343   GLUCOSE 92 08/17/2016 0343   BUN 22 (H) 08/17/2016 0343   BUN 22 (A) 08/17/2016   CREATININE 1.13 (H) 08/17/2016 0343   CREATININE 0.78 06/28/2014 1205   CALCIUM 7.8 (L) 08/17/2016 0343   PROT 6.6 08/14/2016 1519   ALBUMIN 3.4 (L) 08/14/2016 1519   AST 28 08/14/2016 1519   ALT 17 08/14/2016 1519   ALKPHOS 107 08/14/2016 1519   BILITOT 1.1 08/14/2016 1519   GFRNONAA 40 (L) 08/17/2016 0343   GFRAA 46 (L) 08/17/2016 0343    Recent Labs  01/10/16 0838  08/15/16 0531 08/16/16 08/16/16 0457 08/17/16 08/17/16 0343  NA 143  < > 140 147 147* 142 142  K 3.3*  < > 3.3* 3.1* 3.1*  --  3.9  CL 107  < > 94*  --  104  --  106  CO2 27  < > 34*  --  31  --  28  GLUCOSE 133*  < > 86  --  69  --  92  BUN 29*  < > 28* 23* 23* 22* 22*  CREATININE 0.71  < > 1.79* 1.2* 1.20* 1.1 1.13*  CALCIUM 8.8*  < > 8.0*  --  8.0*  --  7.8*  MG 2.0  --   --   --  2.2  --   --   < > = values in this interval not displayed.  Recent Labs  01/08/16 0412 03/19/16 06/04/16 08/14/16 1519  AST 21 21 16 28   ALT 15 12 6* 17  ALKPHOS 109 104 109 107  BILITOT 0.5  --   --  1.1  PROT 7.6  --   --  6.6  ALBUMIN 4.4  --   --  3.4*    Recent Labs  01/08/16 0412  08/14/16 1519  08/15/16 0531  08/16/16 0457 09/04/16 09/08/16 10/08/16 11/10/16  WBC 26.4*  < > 21.1*  < > 16.1*  < > 11.4* 6.5 6.7 8.0 8.7  NEUTROABS 24.0*  --  19.6*  --   --   --   --   --   --   --   --   HGB 12.2  < > 9.4*  < > 8.5*  --  8.3* 8.6* 8.4* 8.1* 8.1*  HCT 39.2  < > 31.3*   < > 28.2*  --  27.8* 29* 26*  --  27*  MCV 86.5  < > 81.7  --  82.5  --  83.5  --   --   --   --   PLT 336  < > 300  < > 256  --  261 311 299 355 322  < > = values in this interval not displayed.  Recent Labs  03/19/16  CHOL 141  LDLCALC 71  TRIG 77   No results found for: [facility] Lab Results  Component Value Date   TSH 2.46 04/26/2016   Lab Results  Component Value Date   HGBA1C 5.6 03/19/2016   Lab Results  Component Value Date   CHOL 141 03/19/2016   HDL 55 03/19/2016   LDLCALC 71 03/19/2016   TRIG 77 03/19/2016   CHOLHDL 2.8 03/06/2014    Significant Diagnostic Results in last 30 days:  No results found.  Assessment and Plan  DIARRHEA-I'm really not certain whether patient actually had diarrhea or not; the patient does not appear ill today and is eating and drinking fine; patient is on a  lot of medications for constipation, therefore will DC one of them, MiraLAX; will continue to monitor   Time spent greater than 25 minutes Inocencio Homes, MD

## 2016-12-11 ENCOUNTER — Encounter: Payer: Self-pay | Admitting: Internal Medicine

## 2016-12-11 NOTE — Assessment & Plan Note (Signed)
Chronic and stable without exacerbation for very long time; plan to continue Demadex 40 mg by mouth daily

## 2016-12-11 NOTE — Assessment & Plan Note (Signed)
We'll controlled; continue Norvasc 5 mg by mouth daily and Demadex 40 mg by mouth daily

## 2016-12-11 NOTE — Assessment & Plan Note (Signed)
Has been stable since patient's been on amitiza 24 mg by mouth twice a day; plan to continue current med

## 2016-12-16 ENCOUNTER — Encounter: Payer: Self-pay | Admitting: Internal Medicine

## 2016-12-16 NOTE — Progress Notes (Signed)
Location:  [facility] Room Number: 284X Place of Service:  SNF (31)  Shelly Duos, MD  Patient Care Team: Shelly Duos, MD as PCP - General (Internal Medicine) Shelly Arabian, MD as Consulting Physician (Orthopedic Surgery) Shelly Nunnery, MD as Consulting Physician (Otolaryngology)  Extended Emergency Contact Information Primary Emergency Contact: Shelly Martin Address: [place], [place] [address] Shelly Martin of [place] Phone: [phone] Relation: Daughter Secondary Emergency Contact: [place], [place] of [place] Mobile Phone: [phone] Relation: Son    Allergies: Amoxicillin; Baclofen; Hctz [hydrochlorothiazide]; and Valium [diazepam]  Chief Complaint  Patient presents with  . Acute Visit    HPI: Patient is 83 y.o. female who   Past Medical History:  Diagnosis Date  . Abnormal stress test 08/26/2013  . Acute encephalopathy   . AKI (acute kidney injury) ([place])   . Altered mental state   . Anemia   . Anxiety disorder, unspecified   . Aortic stenosis   . Arthritis    Back, knees  . Avascular necrosis of bone of right hip ([place]) 09/27/2013  . Bowel incontinence   . BPPV (benign paroxysmal positional vertigo) 09/07/2013  . Closed fracture of pubic ramus ([place]) 09/07/2013  . Colon polyps   . Constipation   . Decreased hearing of both ears 08/19/2016   Last Assessment & Plan:  Concern over hearing loss. Slowly progressive hearing loss.  No history of ear surgery, trauma or infection. EXAMINATION shows normal external canals and tympanic membranes. AUDIOGRAM severe sensorineural hearing loss.  Symmetric in nature. PLAN: Reassured I do not see anything bad going on.  She is a candidate for hearing aids.   will check her benefits.  . Gastric ulcer    years ago  . History of anemia 04/17/2012  . History of TIAs 04/17/2012   June 2011   . Hypertension   .  Hypokalemia   . Pelvic fracture ([place]) 08/28/2013  . Personal history of colonic polyps 10/08/2011   Index polypectomy 2013   . Restless legs syndrome    on Requip  . S/P total hysterectomy and bilateral salpingo-oophorectomy 04/17/2012  . Situational stress   . Spinal stenosis   . Spinal stenosis, lumbar 04/17/2012  . Spinal stenosis, lumbar region, with neurogenic claudication 09/07/2013  . Stroke ([place])    Mini, no residual  . TIA (transient ischemic attack)   . Ulcer causing bleeding and hole in wall of stomach or small intestine   . Urinary bladder calculus   . Urinary incontinence   . Vertigo     Past Surgical History:  Procedure Laterality Date  . COLON RESECTION  50 years ago  . LUMBAR LAMINECTOMY/DECOMPRESSION MICRODISCECTOMY  06/25/2011   Procedure: LUMBAR LAMINECTOMY/DECOMPRESSION MICRODISCECTOMY;  Surgeon: Hosie Spangle, MD;  Location: [facility];  Service: Neurosurgery;  Laterality: Right;  RIGHT Lumbar Two-Three hemilaminectomy and microdiskectomy  . TONSILLECTOMY    . UTERINE FIBROID SURGERY    . VAGINAL HYSTERECTOMY      Allergies as of 12/16/2016      Reactions   Amoxicillin Other (See Comments)   Reaction unknown   Baclofen Other (See Comments)   "fuzzy in the eyes" and dizzy   Hctz [hydrochlorothiazide]    nausea   Valium [diazepam] Other (See Comments)   Pt became unresponsive and O2 sats dropped.  Medication List       Accurate as of 12/16/16  2:08 PM. Always use your most recent med list.          acetaminophen 325 MG tablet Commonly known as:  TYLENOL Take 650 mg by mouth every 6 (six) hours as needed. Notify MD if not relieved. Not to exceed 3000 mg in 24 hour period   amLODipine 5 MG tablet Commonly known as:  NORVASC Take 5 mg by mouth daily.   CHLORASEPTIC MAX SORE THROAT 1.5-33 % Liqd Generic drug:  Phenol-Glycerin Use as directed 1 spray in the mouth or throat 3 (three) times daily as needed. For throat irritation/pain. If sore  throat persist notify the MD   ENSURE Take 237 mLs by mouth 2 (two) times daily between meals.   feeding supplement (PRO-STAT SUGAR FREE 64) Liqd Take 30 mLs by mouth 2 (two) times daily.   lubiprostone 24 MCG capsule Commonly known as:  AMITIZA Take 24 mcg by mouth 2 (two) times daily with a meal.   mirtazapine 15 MG tablet Commonly known as:  REMERON Take 15 mg by mouth at bedtime.   morphine 15 MG 12 hr tablet Commonly known as:  MS CONTIN Take 1 tablet (15 mg total) by mouth 2 (two) times daily.   multivitamin tablet Take 1 tablet by mouth daily.   MYRBETRIQ 50 MG Tb24 tablet Generic drug:  mirabegron ER Take 50 mg by mouth daily.   ondansetron 4 MG tablet Commonly known as:  ZOFRAN Take 4 mg by mouth every 12 (twelve) hours as needed for nausea or vomiting.   oxyCODONE 5 MG immediate release tablet Commonly known as:  Oxy IR/ROXICODONE Take 5 mg by mouth 5 (five) times daily as needed for severe pain.   pantoprazole 40 MG tablet Commonly known as:  PROTONIX Take 40 mg by mouth daily.   potassium chloride 10 MEQ tablet Commonly known as:  K-DUR Take 20 mEq by mouth daily. 2 tablets   rOPINIRole 4 MG tablet Commonly known as:  REQUIP Take 4 mg by mouth at bedtime.   SYSTANE BALANCE 0.6 % Soln Generic drug:  Propylene Glycol Place 1 drop into both eyes 2 (two) times daily.   torsemide 20 MG tablet Commonly known as:  DEMADEX Take 40 mg by mouth every morning. 2 tabs       No orders of the defined types were placed in this encounter.   Immunization History  Administered Date(s) Administered  . Influenza Split 04/15/2012  . Influenza,inj,Quad PF,36+ Mos 03/06/2014  . Influenza-Unspecified 03/01/2016  . PPD Test 08/30/2014, 09/18/2014  . Pneumococcal Polysaccharide-23 08/29/2013    Social History  Substance Use Topics  . Smoking status: Never Smoker  . Smokeless tobacco: Never Used  . Alcohol use 0.0 oz/week     Comment: rarely    Review of  Systems  DATA OBTAINED: from patient, nurse, medical record, family member GENERAL:  no fevers, fatigue, appetite changes SKIN: No itching, rash HEENT: No complaint RESPIRATORY: No cough, wheezing, SOB CARDIAC: No chest pain, palpitations, lower extremity edema  GI: No abdominal pain, No N/V/D or constipation, No heartburn or reflux  GU: No dysuria, frequency or urgency, or incontinence  MUSCULOSKELETAL: No unrelieved bone/joint pain NEUROLOGIC: No headache, dizziness  PSYCHIATRIC: No overt anxiety or sadness  Vitals:   12/16/16 1405  BP: (!) 96/56  Pulse: 88  Resp: 20  Temp: (!) 97.3 F (36.3 C)   Body mass index is 18.75 kg/m. Physical Exam  GENERAL APPEARANCE: Alert, conversant, No acute distress  SKIN: No diaphoresis rash HEENT: Unremarkable RESPIRATORY: Breathing is even, unlabored. Lung sounds are clear   CARDIOVASCULAR: Heart RRR no murmurs, rubs or gallops. No peripheral edema  GASTROINTESTINAL: Abdomen is soft, non-tender, not distended w/ normal bowel sounds.  GENITOURINARY: Bladder non tender, not distended  MUSCULOSKELETAL: No abnormal joints or musculature NEUROLOGIC: Cranial nerves 2-12 grossly intact. Moves all extremities PSYCHIATRIC: Mood and affect appropriate to situation, no behavioral issues  Patient Active Problem List   Diagnosis Date Noted  . Decreased hearing of both ears 08/19/2016  . Pressure injury of skin 08/15/2016  . Aspiration pneumonia ([place]) 08/14/2016  . Chronic diastolic CHF (congestive heart failure) ([place]) 08/14/2016  . Chronic right shoulder pain 02/09/2016  . Bilateral lower extremity edema 02/02/2016  . Ileitis 01/19/2016  . Pressure ulcer 01/09/2016  . SBO (small bowel obstruction) ([place]) 01/08/2016  . Narcotic addiction ([place]) 05/15/2015  . Diarrhea 03/30/2015  . Acute pharyngitis 02/26/2015  . Osteoarthritis of right knee 02/22/2015  . Pain in joint, lower leg 10/31/2014  . Ulcer of sacral region, stage 4 ([place]) 04/15/2014    . Moderate aortic stenosis 04/14/2014  . Pre-syncope 04/14/2014  . Hypotension 04/14/2014  . Acute renal failure superimposed on stage 2 chronic kidney disease ([place]) 04/14/2014  . Anemia 04/14/2014  . Protein-calorie malnutrition, severe ([place]) 04/14/2014  . Altered mental status   . Dehydration 04/13/2014  . Altered mental state 04/13/2014  . Acute kidney injury ([place])   . Hypokalemia 03/25/2014  . Syncope 03/16/2014  . Acute encephalopathy 03/16/2014  . Leg swelling 03/02/2014  . Avascular necrosis of bone of right hip ([place]) 09/27/2013  . Primary osteoarthritis of right hip 09/07/2013  . Spinal stenosis, lumbar region, with neurogenic claudication 09/07/2013  . Closed fracture of pubic ramus ([place]) 09/07/2013  . BPPV (benign paroxysmal positional vertigo) 09/07/2013  . Pharyngeal swelling 08/30/2013  . Pelvic fracture ([place]) 08/28/2013  . Fall 08/26/2013  . Dizziness 08/26/2013  . Abnormal stress test 08/26/2013  . Preop cardiovascular exam 08/01/2013  . Aortic stenosis 08/01/2013  . Edema 06/30/2013  . Loss of weight 01/04/2013  . Situational stress 01/04/2013  . Chronic right hip pain 07/05/2012  . Knee pain, acute 06/29/2012  . Vertigo 06/24/2012  . Acute exacerbation of chronic low back pain 06/24/2012  . Lumbar pain with radiation down right leg 06/09/2012  . Osteoarthritis of hip 04/29/2012  . History of TIAs 04/17/2012  . S/P total hysterectomy and bilateral salpingo-oophorectomy 04/17/2012  . Hard of hearing 04/17/2012  . History of anemia 04/17/2012  . RLS (restless legs syndrome) 04/17/2012  . Chronic back pain 04/17/2012  . Spinal stenosis, lumbar 04/17/2012  . Lower back pain 12/20/2011  . Personal history of colonic polyps 10/08/2011  . H/O 09/14/2011  . Essential hypertension 09/14/2011  . Restless legs syndrome 09/14/2011  . Constipation 08/20/2011  . Urinary incontinence 08/20/2011    CMP     Component Value Date/Time   NA 142 08/17/2016 0343    NA 142 08/17/2016   K 3.9 08/17/2016 0343   CL 106 08/17/2016 0343   CO2 28 08/17/2016 0343   GLUCOSE 92 08/17/2016 0343   BUN 22 (H) 08/17/2016 0343   BUN 22 (A) 08/17/2016   CREATININE 1.13 (H) 08/17/2016 0343   CREATININE 0.78 06/28/2014 1205   CALCIUM 7.8 (L) 08/17/2016 0343   PROT 6.6 08/14/2016 1519   ALBUMIN 3.4 (L) 08/14/2016 1519   AST 28 08/14/2016 1519  ALT 17 08/14/2016 1519   ALKPHOS 107 08/14/2016 1519   BILITOT 1.1 08/14/2016 1519   GFRNONAA 40 (L) 08/17/2016 0343   GFRAA 46 (L) 08/17/2016 0343    Recent Labs  01/10/16 0838  08/15/16 0531 08/16/16 08/16/16 0457 08/17/16 08/17/16 0343  NA 143  < > 140 147 147* 142 142  K 3.3*  < > 3.3* 3.1* 3.1*  --  3.9  CL 107  < > 94*  --  104  --  106  CO2 27  < > 34*  --  31  --  28  GLUCOSE 133*  < > 86  --  69  --  92  BUN 29*  < > 28* 23* 23* 22* 22*  CREATININE 0.71  < > 1.79* 1.2* 1.20* 1.1 1.13*  CALCIUM 8.8*  < > 8.0*  --  8.0*  --  7.8*  MG 2.0  --   --   --  2.2  --   --   < > = values in this interval not displayed.  Recent Labs  01/08/16 0412 03/19/16 06/04/16 08/14/16 1519  AST 21 21 16 28   ALT 15 12 6* 17  ALKPHOS 109 104 109 107  BILITOT 0.5  --   --  1.1  PROT 7.6  --   --  6.6  ALBUMIN 4.4  --   --  3.4*    Recent Labs  01/08/16 0412  08/14/16 1519  08/15/16 0531  08/16/16 0457 09/04/16 09/08/16 10/08/16 11/10/16  WBC 26.4*  < > 21.1*  < > 16.1*  < > 11.4* 6.5 6.7 8.0 8.7  NEUTROABS 24.0*  --  19.6*  --   --   --   --   --   --   --   --   HGB 12.2  < > 9.4*  < > 8.5*  --  8.3* 8.6* 8.4* 8.1* 8.1*  HCT 39.2  < > 31.3*  < > 28.2*  --  27.8* 29* 26*  --  27*  MCV 86.5  < > 81.7  --  82.5  --  83.5  --   --   --   --   PLT 336  < > 300  < > 256  --  261 311 299 355 322  < > = values in this interval not displayed.  Recent Labs  03/19/16  CHOL 141  LDLCALC 71  TRIG 77   No results found for: [facility] Lab Results  Component Value Date   TSH 2.46 04/26/2016   Lab Results   Component Value Date   HGBA1C 5.6 03/19/2016   Lab Results  Component Value Date   CHOL 141 03/19/2016   HDL 55 03/19/2016   LDLCALC 71 03/19/2016   TRIG 77 03/19/2016   CHOLHDL 2.8 03/06/2014    Significant Diagnostic Results in last 30 days:  No results found.  Assessment and Plan  No problem-specific Assessment & Plan notes found for this encounter.   Labs/tests ordered:    [name], MD    This encounter was created in error - please disregard.

## 2016-12-22 ENCOUNTER — Non-Acute Institutional Stay (SKILLED_NURSING_FACILITY): Payer: Medicare Other | Admitting: Nurse Practitioner

## 2016-12-22 ENCOUNTER — Encounter: Payer: Self-pay | Admitting: Nurse Practitioner

## 2016-12-22 DIAGNOSIS — F1120 Opioid dependence, uncomplicated: Secondary | ICD-10-CM

## 2016-12-22 DIAGNOSIS — D500 Iron deficiency anemia secondary to blood loss (chronic): Secondary | ICD-10-CM | POA: Diagnosis not present

## 2016-12-22 DIAGNOSIS — N170 Acute kidney failure with tubular necrosis: Secondary | ICD-10-CM

## 2016-12-22 DIAGNOSIS — N182 Chronic kidney disease, stage 2 (mild): Secondary | ICD-10-CM | POA: Diagnosis not present

## 2016-12-22 DIAGNOSIS — K2100 Gastro-esophageal reflux disease with esophagitis, without bleeding: Secondary | ICD-10-CM

## 2016-12-22 DIAGNOSIS — K219 Gastro-esophageal reflux disease without esophagitis: Secondary | ICD-10-CM | POA: Insufficient documentation

## 2016-12-22 LAB — CBC AND DIFFERENTIAL
HCT: 24 — AB (ref 36–46)
HEMOGLOBIN: 7.5 — AB (ref 12.0–16.0)
PLATELETS: 287 (ref 150–399)
WBC: 6.5

## 2016-12-22 NOTE — Assessment & Plan Note (Signed)
Stable, continue Protonix 

## 2016-12-22 NOTE — Assessment & Plan Note (Signed)
Worse, Hgb 7.5, MCV 79.3, MCH 25.3 12/22/16 from Hgb 8.9, MCV 25.4, MCH 32.5 12/10/16, no apparent bleed, denied abd pain.  Repeat CBC, obtain Vit B12, Folate, TIBC, Fe, Fe Sat, Ferritin, Retic count, Guaiac stools x3, increase Fe 325mg  bid.

## 2016-12-22 NOTE — Progress Notes (Signed)
Location:  [facility] Room Number: [place] of Service:  SNF (31) Provider:  Timithy Arons, Manxie  NP  [name], MD  Patient Care Team: [name], MD as PCP - General (Internal Medicine) [name], MD as Consulting Physician (Orthopedic Surgery) [name], MD as Consulting Physician (Otolaryngology) Carroll Ranney X, NP as Nurse Practitioner (Internal Medicine)  Extended Emergency Contact Information Primary Emergency Contact: [name] Address: [place], [place] [address] [name] of [place] Phone: [phone] Relation: Daughter Secondary Emergency Contact: [place], [place] of [place] Mobile Phone: [phone] Relation: Son  Code Status:  DNR Goals of care: Advanced Directive information Advanced Directives 12/22/2016  Does Patient Have a Medical Advance Directive? Yes  Type of Advance Directive Out of facility DNR (pink MOST or yellow form)  Does patient want to make changes to medical advance directive? No - Patient declined  Copy of [place] in Chart? -  Would patient like information on creating a medical advance directive? -  Pre-existing out of facility DNR order (yellow form or pink MOST form) Yellow form placed in chart (order not valid for inpatient use)     Chief Complaint  Patient presents with  . Acute Visit    Elevated Hgb     HPI: 83 y.o. Female, seen for dropping Hgb 7.5, MCV 79.3, MCH 25.3 12/22/16 from Hgb 8.9, MCV 25.4, MCH 32.5 12/10/16, no apparent bleed, denied abd pain.   Hx of anemia, taking Fe daily. HTN, blood pressure is controlled, Amlodipine 5mg    CHF, compensated clinically, taking Demadex 60mg  3x/week, 40mg  4x/week, Na 140, K 4.3, Bun 47.3, creat 1.43, constipation, stable. Restless leg: stable on Requip 4mg  hs, GERD stable on Protonix 40mg  qd, prn Oxycodone Morphine 15mg  bid for chronic leg  pain,  takes Mirtazapine 15mg  nightly to help sleep. Urinary frequency: managed with Myrbetriq, constipation is managed with Amitiza.    Past Medical History:  Diagnosis Date  . Abnormal stress test 08/26/2013  . Acute encephalopathy   . AKI (acute kidney injury) ([place])   . Altered mental state   . Anemia   . Anxiety disorder, unspecified   . Aortic stenosis   . Arthritis    Back, knees  . Avascular necrosis of bone of right hip ([place]) 09/27/2013  . Bowel incontinence   . BPPV (benign paroxysmal positional vertigo) 09/07/2013  . Closed fracture of pubic ramus ([place]) 09/07/2013  . Colon polyps   . Constipation   . Decreased hearing of both ears 08/19/2016   Last Assessment & Plan:  Concern over hearing loss. Slowly progressive hearing loss.  No history of ear surgery, trauma or infection. EXAMINATION shows normal external canals and tympanic membranes. AUDIOGRAM severe sensorineural hearing loss.  Symmetric in nature. PLAN: Reassured I do not see anything bad going on.  She is a candidate for hearing aids.   will check her benefits.  . Gastric ulcer    years ago  . History of anemia 04/17/2012  . History of TIAs 04/17/2012   June 2011   . Hypertension   . Hypokalemia   . Pelvic fracture ([place]) 08/28/2013  . Personal history of colonic polyps 10/08/2011   Index polypectomy 2013   . Restless legs syndrome    on Requip  . S/P total hysterectomy and bilateral salpingo-oophorectomy 04/17/2012  .  Situational stress   . Spinal stenosis   . Spinal stenosis, lumbar 04/17/2012  . Spinal stenosis, lumbar region, with neurogenic claudication 09/07/2013  . Stroke ([place])    Mini, no residual  . TIA (transient ischemic attack)   . Ulcer causing bleeding and hole in wall of stomach or small intestine   . Urinary bladder calculus   . Urinary incontinence   . Vertigo    Past Surgical History:  Procedure Laterality Date  . COLON RESECTION  50 years ago  . LUMBAR LAMINECTOMY/DECOMPRESSION MICRODISCECTOMY   06/25/2011   Procedure: LUMBAR LAMINECTOMY/DECOMPRESSION MICRODISCECTOMY;  Surgeon: Hosie Spangle, MD;  Location: [facility];  Service: Neurosurgery;  Laterality: Right;  RIGHT Lumbar Two-Three hemilaminectomy and microdiskectomy  . TONSILLECTOMY    . UTERINE FIBROID SURGERY    . VAGINAL HYSTERECTOMY      Allergies  Allergen Reactions  . Amoxicillin Other (See Comments)    Reaction unknown  . Baclofen Other (See Comments)    "fuzzy in the eyes" and dizzy  . Hctz [Hydrochlorothiazide]     nausea  . Valium [Diazepam] Other (See Comments)    Pt became unresponsive and O2 sats dropped.    Outpatient Encounter Prescriptions as of 12/22/2016  Medication Sig  . acetaminophen (TYLENOL) 325 MG tablet Take 325 mg by mouth every 6 (six) hours as needed. Notify MD if not relieved. Not to exceed 3000 mg in 24 hour period   . Amino Acids-Protein Hydrolys (FEEDING SUPPLEMENT, PRO-STAT SUGAR FREE 64,) LIQD Take 30 mLs by mouth 2 (two) times daily.  Marland Kitchen amLODipine (NORVASC) 5 MG tablet Take 5 mg by mouth daily.  Marland Kitchen lubiprostone (AMITIZA) 24 MCG capsule Take 24 mcg by mouth 2 (two) times daily with a meal.   . mirabegron ER (MYRBETRIQ) 50 MG TB24 tablet Take 50 mg by mouth daily.  . mirtazapine (REMERON) 15 MG tablet Take 15 mg by mouth at bedtime.   Marland Kitchen morphine (MS CONTIN) 15 MG 12 hr tablet Take 1 tablet (15 mg total) by mouth 2 (two) times daily.  . Multiple Vitamin (MULTIVITAMIN) tablet Take 1 tablet by mouth daily.  . ondansetron (ZOFRAN) 4 MG tablet Take 4 mg by mouth every 12 (twelve) hours as needed for nausea or vomiting.  Marland Kitchen oxyCODONE (OXY IR/ROXICODONE) 5 MG immediate release tablet Take 5 mg by mouth 5 (five) times daily as needed for severe pain.  . pantoprazole (PROTONIX) 40 MG tablet Take 40 mg by mouth daily.   . Phenol-Glycerin (CHLORASEPTIC MAX SORE THROAT) 1.5-33 % LIQD Use as directed 1 spray in the mouth or throat 3 (three) times daily as needed. For throat irritation/pain. If sore  throat persist notify the MD  . potassium chloride (K-DUR) 10 MEQ tablet Take 20 mEq by mouth daily. 2 tablets   . Propylene Glycol (SYSTANE BALANCE) 0.6 % SOLN Place 1 drop into both eyes 2 (two) times daily.  Marland Kitchen rOPINIRole (REQUIP) 4 MG tablet Take 4 mg by mouth at bedtime.  . torsemide (DEMADEX) 20 MG tablet Take 60 mg by mouth every Monday, Wednesday, and Friday. Lydia Guiles, Thurs, Sat take 40 mg.  . [DISCONTINUED] ENSURE (ENSURE) Take 237 mLs by mouth 2 (two) times daily between meals.   No facility-administered encounter medications on file as of 12/22/2016.     Review of Systems  Constitutional: Negative for activity change, appetite change, chills, diaphoresis, fatigue and fever.  HENT: Positive for hearing loss. Negative for congestion.   Eyes: Negative for visual disturbance.  Respiratory: Negative for cough, choking, chest tightness, shortness of breath and wheezing.   Cardiovascular: Positive for leg swelling. Negative for chest pain and palpitations.       Ankles/feet  Gastrointestinal: Negative for abdominal distention and abdominal pain.  Endocrine: Negative for polyuria.  Genitourinary: Negative for difficulty urinating, dysuria, frequency and urgency.  Musculoskeletal: Positive for gait problem. Negative for arthralgias.       Walker.   Skin:       Chronic venous insufficiency change BLE  Neurological: Negative for dizziness, speech difficulty, weakness and headaches.  Psychiatric/Behavioral: Negative for agitation, behavioral problems, confusion and sleep disturbance. The patient is not nervous/anxious.     Immunization History  Administered Date(s) Administered  . Influenza Split 04/15/2012  . Influenza,inj,Quad PF,36+ Mos 03/06/2014  . Influenza-Unspecified 03/01/2016  . PPD Test 08/30/2014, 09/18/2014  . Pneumococcal Polysaccharide-23 08/29/2013   Pertinent  Health Maintenance Due  Topic Date Due  . PNA vac Low Risk Adult (2 of 2 - PCV13) 08/30/2014  . DEXA  SCAN  05/27/2023 (Originally [birth year]09/1985)  . INFLUENZA VACCINE  12/24/2016   Fall Risk  11/07/2016 06/30/2013 01/04/2013  Falls in the past year? No Yes No  Risk for fall due to : - Impaired balance/gait;Impaired mobility;Medication side effect -   Functional Status Survey:    Vitals:   12/22/16 1611  BP: 112/78  Pulse: 92  Resp: 18  Temp: 97.6 F (36.4 C)  SpO2: 96%  Weight: 96 lb (43.5 kg)  Height: 5' (1.524 m)   Body mass index is 18.75 kg/m. Physical Exam  Constitutional: She is oriented to person, place, and time.  Cardiovascular: Normal rate.   Murmur heard. Systolic murmur 3/6  Pulmonary/Chest: Effort normal and breath sounds normal. No respiratory distress. She has no wheezes. She has no rales.  Abdominal: Soft. Bowel sounds are normal.  Musculoskeletal: Normal range of motion. She exhibits edema.  2+ ankle/feet edema  Neurological: She is alert and oriented to person, place, and time. No cranial nerve deficit. Coordination normal.  Skin: Skin is warm and dry. There is erythema. There is pallor.  Pale. BLE venous stasis  Psychiatric: She has a normal mood and affect. Her behavior is normal. Thought content normal.    Labs reviewed:  Recent Labs  01/10/16 0838  08/15/16 0531 08/16/16 08/16/16 0457 08/17/16 08/17/16 0343  NA 143  < > 140 147 147* 142 142  K 3.3*  < > 3.3* 3.1* 3.1*  --  3.9  CL 107  < > 94*  --  104  --  106  CO2 27  < > 34*  --  31  --  28  GLUCOSE 133*  < > 86  --  69  --  92  BUN 29*  < > 28* 23* 23* 22* 22*  CREATININE 0.71  < > 1.79* 1.2* 1.20* 1.1 1.13*  CALCIUM 8.8*  < > 8.0*  --  8.0*  --  7.8*  MG 2.0  --   --   --  2.2  --   --   < > = values in this interval not displayed.  Recent Labs  01/08/16 0412 03/19/16 06/04/16 08/14/16 1519  AST 21 21 16 28   ALT 15 12 6* 17  ALKPHOS 109 104 109 107  BILITOT 0.5  --   --  1.1  PROT 7.6  --   --  6.6  ALBUMIN 4.4  --   --  3.4*    Recent Labs  01/08/16 0412  08/14/16 1519   08/15/16 0531  08/16/16 0457 09/04/16 09/08/16 10/08/16 11/10/16  WBC 26.4*  < > 21.1*  < > 16.1*  < > 11.4* 6.5 6.7 8.0 8.7  NEUTROABS 24.0*  --  19.6*  --   --   --   --   --   --   --   --   HGB 12.2  < > 9.4*  < > 8.5*  --  8.3* 8.6* 8.4* 8.1* 8.1*  HCT 39.2  < > 31.3*  < > 28.2*  --  27.8* 29* 26*  --  27*  MCV 86.5  < > 81.7  --  82.5  --  83.5  --   --   --   --   PLT 336  < > 300  < > 256  --  261 311 299 355 322  < > = values in this interval not displayed. Lab Results  Component Value Date   TSH 2.46 04/26/2016   Lab Results  Component Value Date   HGBA1C 5.6 03/19/2016   Lab Results  Component Value Date   CHOL 141 03/19/2016   HDL 55 03/19/2016   LDLCALC 71 03/19/2016   TRIG 77 03/19/2016   CHOLHDL 2.8 03/06/2014    Significant Diagnostic Results in last 30 days:  No results found.  Assessment/Plan Narcotic addiction ([place]) Pain in lower legs: prn Oxycodone Morphine 15mg  bid  Anemia Worse, Hgb 7.5, MCV 79.3, MCH 25.3 12/22/16 from Hgb 8.9, MCV 25.4, MCH 32.5 12/10/16, no apparent bleed, denied abd pain.  Repeat CBC, obtain Vit B12, Folate, TIBC, Fe, Fe Sat, Ferritin, Retic count, Guaiac stools x3, increase Fe 325mg  bid.   Acute renal failure superimposed on stage 2 chronic kidney disease ([place])  12/22/16 Na 140, K 4.3, Bun 47.3, creat 1.43, Demadex contributory.   GERD (gastroesophageal reflux disease) Stable, continue Protonix.      Family/ staff Communication: SNF  Labs/tests ordered:  CBC, Guaiac stools x3, TIBC, Ferritin, FE, Fe sat, Retic count, Vit B12, Folate.

## 2016-12-22 NOTE — Assessment & Plan Note (Signed)
12/22/16 Na 140, K 4.3, Bun 47.3, creat 1.43, Demadex contributory.

## 2016-12-22 NOTE — Assessment & Plan Note (Signed)
Pain in lower legs: prn Oxycodone Morphine 15mg  bid

## 2016-12-23 ENCOUNTER — Other Ambulatory Visit: Payer: Self-pay | Admitting: *Deleted

## 2016-12-23 LAB — CBC AND DIFFERENTIAL
HEMATOCRIT: 24 — AB (ref 36–46)
HEMOGLOBIN: 7.5 — AB (ref 12.0–16.0)
PLATELETS: 273 (ref 150–399)
WBC: 6.6

## 2016-12-23 LAB — VITAMIN B12: Vitamin B-12: 472

## 2017-01-08 ENCOUNTER — Encounter: Payer: Self-pay | Admitting: Internal Medicine

## 2017-01-08 NOTE — Assessment & Plan Note (Signed)
Controlled; continue Requip 4 mg by mouth daily at bedtime

## 2017-01-08 NOTE — Assessment & Plan Note (Signed)
Chronic, waxes and wanes; continues to be less swollen than prior; plan to continue Demadex 40 mg daily

## 2017-01-11 ENCOUNTER — Encounter: Payer: Self-pay | Admitting: Internal Medicine

## 2017-01-14 ENCOUNTER — Encounter: Payer: Self-pay | Admitting: Internal Medicine

## 2017-01-14 ENCOUNTER — Non-Acute Institutional Stay (SKILLED_NURSING_FACILITY): Payer: Medicare Other | Admitting: Internal Medicine

## 2017-01-14 DIAGNOSIS — K2100 Gastro-esophageal reflux disease with esophagitis, without bleeding: Secondary | ICD-10-CM

## 2017-01-14 DIAGNOSIS — G8929 Other chronic pain: Secondary | ICD-10-CM | POA: Diagnosis not present

## 2017-01-14 DIAGNOSIS — K5901 Slow transit constipation: Secondary | ICD-10-CM

## 2017-01-14 DIAGNOSIS — M25551 Pain in right hip: Secondary | ICD-10-CM | POA: Diagnosis not present

## 2017-01-14 NOTE — Progress Notes (Signed)
Location:  [facility] Room Number: 195K Place of Service:  SNF (31)  [name], MD  Patient Care Team: [name], MD as PCP - General (Internal Medicine) [name], MD as Consulting Physician (Orthopedic Surgery) [name], MD as Consulting Physician (Otolaryngology) Mast, Man X, NP as Nurse Practitioner (Internal Medicine)  Extended Emergency Contact Information Primary Emergency Contact: [name] Address: [place], [place] [address] [name] of [place] Phone: [phone] Relation: Daughter Secondary Emergency Contact: [place], [place] of [place] Mobile Phone: [phone] Relation: Son    Allergies: Amoxicillin; Baclofen; Hctz [hydrochlorothiazide]; and Valium [diazepam]  Chief Complaint  Patient presents with  . Medical Management of Chronic Issues    routine visit    HPI: Patient is 83 y.o. female who Is being seen for routine issues of chronic hip pain, GERD and constipation.  Past Medical History:  Diagnosis Date  . Abnormal stress test 08/26/2013  . Acute encephalopathy   . AKI (acute kidney injury) ([place])   . Altered mental state   . Anemia   . Anxiety disorder, unspecified   . Aortic stenosis   . Arthritis    Back, knees  . Avascular necrosis of bone of right hip ([place]) 09/27/2013  . Bowel incontinence   . BPPV (benign paroxysmal positional vertigo) 09/07/2013  . Closed fracture of pubic ramus ([place]) 09/07/2013  . Colon polyps   . Constipation   . Decreased hearing of both ears 08/19/2016   Last Assessment & Plan:  Concern over hearing loss. Slowly progressive hearing loss.  No history of ear surgery, trauma or infection. EXAMINATION shows normal external canals and tympanic membranes. AUDIOGRAM severe sensorineural hearing loss.  Symmetric in nature. PLAN: Reassured I do not see anything bad going on.  She is a candidate for  hearing aids.   will check her benefits.  . Gastric ulcer    years ago  . History of anemia 04/17/2012  . History of TIAs 04/17/2012   June 2011   . Hypertension   . Hypokalemia   . Pelvic fracture ([place]) 08/28/2013  . Personal history of colonic polyps 10/08/2011   Index polypectomy 2013   . Restless legs syndrome    on Requip  . S/P total hysterectomy and bilateral salpingo-oophorectomy 04/17/2012  . Situational stress   . Spinal stenosis   . Spinal stenosis, lumbar 04/17/2012  . Spinal stenosis, lumbar region, with neurogenic claudication 09/07/2013  . Stroke ([place])    Mini, no residual  . TIA (transient ischemic attack)   . Ulcer causing bleeding and hole in wall of stomach or small intestine   . Urinary bladder calculus   . Urinary incontinence   . Vertigo     Past Surgical History:  Procedure Laterality Date  . COLON RESECTION  50 years ago  . LUMBAR LAMINECTOMY/DECOMPRESSION MICRODISCECTOMY  06/25/2011   Procedure: LUMBAR LAMINECTOMY/DECOMPRESSION MICRODISCECTOMY;  Surgeon: Hosie Spangle, MD;  Location: [facility];  Service: Neurosurgery;  Laterality: Right;  RIGHT Lumbar Two-Three hemilaminectomy and microdiskectomy  . TONSILLECTOMY    . TOTAL ABDOMINAL HYSTERECTOMY W/ BILATERAL SALPINGOOPHORECTOMY  04/17/2012  . UTERINE FIBROID SURGERY      Allergies as of 01/14/2017      Reactions   Amoxicillin Other (See Comments)   Reaction unknown   Baclofen Other (See Comments)   "  fuzzy in the eyes" and dizzy   Hctz [hydrochlorothiazide]    nausea   Valium [diazepam] Other (See Comments)   Pt became unresponsive and O2 sats dropped.      Medication List       Accurate as of 01/14/17 11:59 PM. Always use your most recent med list.          acetaminophen 325 MG tablet Commonly known as:  TYLENOL Take 325 mg by mouth every 6 (six) hours as needed. Notify MD if not relieved. Not to exceed 3000 mg in 24 hour period   amLODipine 5 MG tablet Commonly known as:   NORVASC Take 5 mg by mouth daily.   CHLORASEPTIC MAX SORE THROAT 1.5-33 % Liqd Generic drug:  Phenol-Glycerin Use as directed 1 spray in the mouth or throat 3 (three) times daily as needed. For throat irritation/pain. If sore throat persist notify the MD   feeding supplement (PRO-STAT SUGAR FREE 64) Liqd Take 30 mLs by mouth. Three times daily between meals to support weight status   ferrous sulfate 325 (65 FE) MG tablet Take 325 mg by mouth. Take one tablet twice daily   lubiprostone 24 MCG capsule Commonly known as:  AMITIZA Take 24 mcg by mouth 2 (two) times daily with a meal.   mirtazapine 15 MG tablet Commonly known as:  REMERON Take 15 mg by mouth. Take 1/2 tablet at bedtime   morphine 15 MG 12 hr tablet Commonly known as:  MS CONTIN Take 1 tablet (15 mg total) by mouth 2 (two) times daily.   multivitamin tablet Take 1 tablet by mouth daily.   MYRBETRIQ 50 MG Tb24 tablet Generic drug:  mirabegron ER Take 50 mg by mouth daily.   NON FORMULARY Magic cup with lunch and dinner meal to support weight status   ondansetron 4 MG tablet Commonly known as:  ZOFRAN Take 4 mg by mouth every 12 (twelve) hours as needed for nausea or vomiting.   oxyCODONE 5 MG immediate release tablet Commonly known as:  Oxy IR/ROXICODONE Take 5 mg by mouth 5 (five) times daily as needed for severe pain.   pantoprazole 40 MG tablet Commonly known as:  PROTONIX Take 40 mg by mouth daily.   potassium chloride 10 MEQ tablet Commonly known as:  K-DUR Take 20 mEq by mouth daily. 2 tablets   rOPINIRole 4 MG tablet Commonly known as:  REQUIP Take 4 mg by mouth at bedtime.   SYSTANE BALANCE 0.6 % Soln Generic drug:  Propylene Glycol Place 1 drop into both eyes 2 (two) times daily.   torsemide 20 MG tablet Commonly known as:  DEMADEX Take 60 mg by mouth every Monday, Wednesday, and Friday. 40 mg on Sun, Tues, Thurs, Sat       Meds ordered this encounter  Medications  . NON FORMULARY     Sig: Magic cup with lunch and dinner meal to support weight status  . ferrous sulfate 325 (65 FE) MG tablet    Sig: Take 325 mg by mouth. Take one tablet twice daily    Immunization History  Administered Date(s) Administered  . Influenza Split 04/15/2012  . Influenza,inj,Quad PF,6+ Mos 03/06/2014  . Influenza-Unspecified 03/01/2016  . PPD Test 08/30/2014, 09/18/2014  . Pneumococcal Polysaccharide-23 08/29/2013    Social History  Substance Use Topics  . Smoking status: Never Smoker  . Smokeless tobacco: Never Used  . Alcohol use 0.0 oz/week     Comment: rarely    Review of Systems  DATA OBTAINED: from patient GENERAL:  no fevers, fatigue, appetite changes SKIN: No itching, rash HEENT: No complaint RESPIRATORY: No cough, wheezing, SOB CARDIAC: No chest pain, palpitations, lower extremity edema  GI: No abdominal pain, No N/V/D or constipation, No heartburn or reflux  GU: No dysuria, frequency or urgency, or incontinence  MUSCULOSKELETAL: No unrelieved bone/joint pain NEUROLOGIC: No headache, dizziness  PSYCHIATRIC: No overt anxiety or sadness  Vitals:   01/14/17 1322  BP: (!) 120/58  Pulse: 82  Resp: 20  Temp: 98 F (36.7 C)   Body mass index is 18.71 kg/m. Physical Exam  GENERAL APPEARANCE: Alert, conversant, No acute distress  SKIN: No diaphoresis rash HEENT: Unremarkable RESPIRATORY: Breathing is even, unlabored. Lung sounds are clear   CARDIOVASCULAR: Heart RRR no murmurs, rubs or gallops. Chronic peripheral edema  GASTROINTESTINAL: Abdomen is soft, non-tender, not distended w/ normal bowel sounds.  GENITOURINARY: Bladder non tender, not distended  MUSCULOSKELETAL: No abnormal joints or musculature NEUROLOGIC: Cranial nerves 2-12 grossly intact. Moves all extremities PSYCHIATRIC: Mood and affect appropriate to situation, no behavioral issues  Patient Active Problem List   Diagnosis Date Noted  . GERD (gastroesophageal reflux disease) 12/22/2016  .  Decreased hearing of both ears 08/19/2016  . Pressure injury of skin 08/15/2016  . Aspiration pneumonia ([place]) 08/14/2016  . Chronic diastolic CHF (congestive heart failure) ([place]) 08/14/2016  . Chronic right shoulder pain 02/09/2016  . Bilateral lower extremity edema 02/02/2016  . Ileitis 01/19/2016  . Pressure ulcer 01/09/2016  . SBO (small bowel obstruction) ([place]) 01/08/2016  . Narcotic addiction ([place]) 05/15/2015  . Diarrhea 03/30/2015  . Acute pharyngitis 02/26/2015  . Osteoarthritis of right knee 02/22/2015  . Pain in joint, lower leg 10/31/2014  . Ulcer of sacral region, stage 4 ([place]) 04/15/2014  . Moderate aortic stenosis 04/14/2014  . Pre-syncope 04/14/2014  . Hypotension 04/14/2014  . Acute renal failure superimposed on stage 2 chronic kidney disease ([place]) 04/14/2014  . Anemia 04/14/2014  . Protein-calorie malnutrition, severe ([place]) 04/14/2014  . Altered mental status   . Dehydration 04/13/2014  . Altered mental state 04/13/2014  . Acute kidney injury ([place])   . Hypokalemia 03/25/2014  . Syncope 03/16/2014  . Acute encephalopathy 03/16/2014  . Leg swelling 03/02/2014  . Avascular necrosis of bone of right hip ([place]) 09/27/2013  . Primary osteoarthritis of right hip 09/07/2013  . Spinal stenosis, lumbar region, with neurogenic claudication 09/07/2013  . Closed fracture of pubic ramus ([place]) 09/07/2013  . BPPV (benign paroxysmal positional vertigo) 09/07/2013  . Pharyngeal swelling 08/30/2013  . Pelvic fracture ([place]) 08/28/2013  . Fall 08/26/2013  . Dizziness 08/26/2013  . Abnormal stress test 08/26/2013  . Preop cardiovascular exam 08/01/2013  . Aortic stenosis 08/01/2013  . Edema 06/30/2013  . Loss of weight 01/04/2013  . Situational stress 01/04/2013  . Chronic right hip pain 07/05/2012  . Knee pain, acute 06/29/2012  . Vertigo 06/24/2012  . Acute exacerbation of chronic low back pain 06/24/2012  . Lumbar pain with radiation down right leg 06/09/2012  .  Osteoarthritis of hip 04/29/2012  . History of TIAs 04/17/2012  . S/P total hysterectomy and bilateral salpingo-oophorectomy 04/17/2012  . Hard of hearing 04/17/2012  . History of anemia 04/17/2012  . RLS (restless legs syndrome) 04/17/2012  . Chronic back pain 04/17/2012  . Spinal stenosis, lumbar 04/17/2012  . Lower back pain 12/20/2011  . Personal history of colonic polyps 10/08/2011  . H/O 09/14/2011  . Essential hypertension 09/14/2011  . Restless legs syndrome 09/14/2011  .  Constipation 08/20/2011  . Urinary incontinence 08/20/2011    CMP     Component Value Date/Time   NA 142 08/17/2016 0343   NA 142 08/17/2016   K 3.9 08/17/2016 0343   CL 106 08/17/2016 0343   CO2 28 08/17/2016 0343   GLUCOSE 92 08/17/2016 0343   BUN 22 (H) 08/17/2016 0343   BUN 22 (A) 08/17/2016   CREATININE 1.13 (H) 08/17/2016 0343   CREATININE 0.78 06/28/2014 1205   CALCIUM 7.8 (L) 08/17/2016 0343   PROT 6.6 08/14/2016 1519   ALBUMIN 3.4 (L) 08/14/2016 1519   AST 28 08/14/2016 1519   ALT 17 08/14/2016 1519   ALKPHOS 107 08/14/2016 1519   BILITOT 1.1 08/14/2016 1519   GFRNONAA 40 (L) 08/17/2016 0343   GFRAA 46 (L) 08/17/2016 0343    Recent Labs  08/15/16 0531 08/16/16 08/16/16 0457 08/17/16 08/17/16 0343  NA 140 147 147* 142 142  K 3.3* 3.1* 3.1*  --  3.9  CL 94*  --  104  --  106  CO2 34*  --  31  --  28  GLUCOSE 86  --  69  --  92  BUN 28* 23* 23* 22* 22*  CREATININE 1.79* 1.2* 1.20* 1.1 1.13*  CALCIUM 8.0*  --  8.0*  --  7.8*  MG  --   --  2.2  --   --     Recent Labs  03/19/16 06/04/16 08/14/16 1519  AST 21 16 28   ALT 12 6* 17  ALKPHOS 104 109 107  BILITOT  --   --  1.1  PROT  --   --  6.6  ALBUMIN  --   --  3.4*    Recent Labs  08/14/16 1519  08/15/16 0531  08/16/16 0457  11/10/16 12/22/16 12/23/16  WBC 21.1*  < > 16.1*  < > 11.4*  < > 8.7 6.5 6.6  NEUTROABS 19.6*  --   --   --   --   --   --   --   --   HGB 9.4*  < > 8.5*  --  8.3*  < > 8.1* 7.5* 7.5*  HCT  31.3*  < > 28.2*  --  27.8*  < > 27* 24* 24*  MCV 81.7  --  82.5  --  83.5  --   --   --   --   PLT 300  < > 256  --  261  < > 322 287 273  < > = values in this interval not displayed.  Recent Labs  03/19/16  CHOL 141  LDLCALC 71  TRIG 77   No results found for: [facility] Lab Results  Component Value Date   TSH 2.46 04/26/2016   Lab Results  Component Value Date   HGBA1C 5.6 03/19/2016   Lab Results  Component Value Date   CHOL 141 03/19/2016   HDL 55 03/19/2016   LDLCALC 71 03/19/2016   TRIG 77 03/19/2016   CHOLHDL 2.8 03/06/2014    Significant Diagnostic Results in last 30 days:  No results found.  Assessment and Plan  Chronic right hip pain Chronic and stable; patient is followed by pain clinic that is Very Consistent Regimen of MS Contin 50 Mg Twice a Day and Oxycodone 5 Mg up to 5 Times A Day As Needed; They've Done a Really Good Job  GERD (gastroesophageal reflux disease) Stable, chronic; plan to continue Protonix 40 mg by mouth daily  Constipation Stable on  amitiza; plan to continue amitiza 24 mg 3 times a day    Webb Silversmith D. [name], MD

## 2017-02-07 ENCOUNTER — Encounter: Payer: Self-pay | Admitting: Internal Medicine

## 2017-02-07 NOTE — Assessment & Plan Note (Signed)
Stable on amitiza; plan to continue amitiza 24 mg 3 times a day

## 2017-02-07 NOTE — Assessment & Plan Note (Signed)
Stable, chronic; plan to continue Protonix 40 mg by mouth daily

## 2017-02-07 NOTE — Assessment & Plan Note (Signed)
Chronic and stable; patient is followed by pain clinic that is Very Consistent Regimen of MS Contin 50 Mg Twice a Day and Oxycodone 5 Mg up to 5 Times A Day As Needed; They've Done a Really Good Job

## 2017-02-12 ENCOUNTER — Non-Acute Institutional Stay (SKILLED_NURSING_FACILITY): Payer: Medicare Other | Admitting: Internal Medicine

## 2017-02-12 ENCOUNTER — Encounter: Payer: Self-pay | Admitting: Internal Medicine

## 2017-02-12 DIAGNOSIS — I10 Essential (primary) hypertension: Secondary | ICD-10-CM

## 2017-02-12 DIAGNOSIS — F1120 Opioid dependence, uncomplicated: Secondary | ICD-10-CM

## 2017-02-12 DIAGNOSIS — I5032 Chronic diastolic (congestive) heart failure: Secondary | ICD-10-CM | POA: Diagnosis not present

## 2017-02-12 NOTE — Progress Notes (Addendum)
Location:  [facility] Room Number: 419Q Place of Service:  SNF (31)  [name], MD  Patient Care Team: [name], MD as PCP - General (Internal Medicine) [name], MD as Consulting Physician (Orthopedic Surgery) [name], MD as Consulting Physician (Otolaryngology) Mast, Man X, NP as Nurse Practitioner (Internal Medicine)  Extended Emergency Contact Information Primary Emergency Contact: [name] Address: [place], [place] [address] [name] of [place] Phone: [phone] Relation: Daughter Secondary Emergency Contact: Inola, Leith of [place] Mobile Phone: [phone] Relation: Son    Allergies: Amoxicillin; Baclofen; Hctz [hydrochlorothiazide]; and Valium [diazepam]  Chief Complaint  Patient presents with  . Medical Management of Chronic Issues    routine visit    HPI: Patient is 83 y.o. female who s being seen for routine issues of narcotic addiction, hypertension, and chronic iastolic congestive heart failure.  Past Medical History:  Diagnosis Date  . Abnormal stress test 08/26/2013  . Acute encephalopathy   . AKI (acute kidney injury) ([place])   . Altered mental state   . Anemia   . Anxiety disorder, unspecified   . Aortic stenosis   . Arthritis    Back, knees  . Avascular necrosis of bone of right hip ([place]) 09/27/2013  . Bowel incontinence   . BPPV (benign paroxysmal positional vertigo) 09/07/2013  . Closed fracture of pubic ramus ([place]) 09/07/2013  . Colon polyps   . Constipation   . Decreased hearing of both ears 08/19/2016   Last Assessment & Plan:  Concern over hearing loss. Slowly progressive hearing loss.  No history of ear surgery, trauma or infection. EXAMINATION shows normal external canals and tympanic membranes. AUDIOGRAM severe sensorineural hearing loss.  Symmetric in nature. PLAN: Reassured I do not see anything bad  going on.  She is a candidate for hearing aids.   will check her benefits.  . Gastric ulcer    years ago  . History of anemia 04/17/2012  . History of TIAs 04/17/2012   June 2011   . Hypertension   . Hypokalemia   . Pelvic fracture ([place]) 08/28/2013  . Personal history of colonic polyps 10/08/2011   Index polypectomy 2013   . Restless legs syndrome    on Requip  . S/P total hysterectomy and bilateral salpingo-oophorectomy 04/17/2012  . Situational stress   . Spinal stenosis   . Spinal stenosis, lumbar 04/17/2012  . Spinal stenosis, lumbar region, with neurogenic claudication 09/07/2013  . Stroke ([place])    Mini, no residual  . TIA (transient ischemic attack)   . Ulcer causing bleeding and hole in wall of stomach or small intestine   . Urinary bladder calculus   . Urinary incontinence   . Vertigo     Past Surgical History:  Procedure Laterality Date  . COLON RESECTION  50 years ago  . TONSILLECTOMY    . TOTAL ABDOMINAL HYSTERECTOMY W/ BILATERAL SALPINGOOPHORECTOMY  04/17/2012  . UTERINE FIBROID SURGERY      Allergies as of 02/12/2017      Reactions   Amoxicillin Other (See Comments)   Reaction unknown   Baclofen Other (See Comments)   "fuzzy in the eyes" and dizzy   Hctz [hydrochlorothiazide]    nausea   Valium [diazepam] Other (See Comments)   Pt became unresponsive and O2 sats dropped.  Medication List        Accurate as of 02/12/17 11:59 PM. Always use your most recent med list.          acetaminophen 325 MG tablet Commonly known as:  TYLENOL Take 325 mg by mouth every 6 (six) hours as needed. Notify MD if not relieved. Not to exceed 3000 mg in 24 hour period   amLODipine 5 MG tablet Commonly known as:  NORVASC Take 5 mg by mouth daily.   CHLORASEPTIC MAX SORE THROAT 1.5-33 % Liqd Generic drug:  Phenol-Glycerin Use as directed 1 spray in the mouth or throat 3 (three) times daily as needed. For throat irritation/pain. If sore throat persist notify the MD    feeding supplement (PRO-STAT SUGAR FREE 64) Liqd Take 30 mLs by mouth. Two times daily between meals to support weight status   ferrous sulfate 325 (65 FE) MG tablet Take 325 mg by mouth. Take one tablet twice daily   lubiprostone 24 MCG capsule Commonly known as:  AMITIZA Take 24 mcg by mouth 2 (two) times daily with a meal.   mirtazapine 15 MG tablet Commonly known as:  REMERON Take 15 mg by mouth. Take 1 tablet at bedtime   morphine 15 MG 12 hr tablet Commonly known as:  MS CONTIN Take 1 tablet (15 mg total) by mouth 2 (two) times daily.   multivitamin tablet Take 1 tablet by mouth daily.   MYRBETRIQ 50 MG Tb24 tablet Generic drug:  mirabegron ER Take 50 mg by mouth daily.   NON FORMULARY Magic cup with lunch and dinner meal to support weight status   ondansetron 4 MG tablet Commonly known as:  ZOFRAN Take 4 mg by mouth every 12 (twelve) hours as needed for nausea or vomiting.   oxyCODONE 5 MG immediate release tablet Commonly known as:  Oxy IR/ROXICODONE Take 5 mg by mouth. Take one tablet three times a day as needed for breakthrough pain   pantoprazole 40 MG tablet Commonly known as:  PROTONIX Take 40 mg by mouth daily.   potassium chloride 10 MEQ tablet Commonly known as:  K-DUR Take 20 mEq by mouth daily. 2 tablets   rOPINIRole 4 MG tablet Commonly known as:  REQUIP Take 4 mg by mouth at bedtime.   SYSTANE BALANCE 0.6 % Soln Generic drug:  Propylene Glycol Place 1 drop into both eyes 2 (two) times daily.   torsemide 20 MG tablet Commonly known as:  DEMADEX Take 60 mg by mouth every Monday, Wednesday, and Friday. 40 mg on Sun, Tues, Thurs, Sat       No orders of the defined types were placed in this encounter.   Immunization History  Administered Date(s) Administered  . Influenza Split 04/15/2012  . Influenza,inj,Quad PF,6+ Mos 03/06/2014  . Influenza-Unspecified 03/01/2016  . PPD Test 08/30/2014, 09/18/2014  . Pneumococcal Polysaccharide-23  08/29/2013    Social History   Tobacco Use  . Smoking status: Never Smoker  . Smokeless tobacco: Never Used  Substance Use Topics  . Alcohol use: Yes    Alcohol/week: 0.0 oz    Comment: rarely    Review of Systems  DATA OBTAINED: from patient, nurse GENERAL:  no fevers, fatigue, appetite changes SKIN: No itching, rash HEENT: No complaint RESPIRATORY: No cough, wheezing, SOB CARDIAC: No chest pain, palpitations, lower extremity edema  GI: No abdominal pain, No N/V/D or constipation, No heartburn or reflux  GU: No dysuria, frequency or urgency, or incontinence  MUSCULOSKELETAL: No unrelieved bone/joint pain NEUROLOGIC:  No headache, dizziness  PSYCHIATRIC: No overt anxiety or sadness  Vitals:   02/12/17 1149  BP: (!) 120/58  Pulse: 90  Resp: 19  Temp: 98 F (36.7 C)   Body mass index is 18.36 kg/m. Physical Exam  GENERAL APPEARANCE: Alert, conversant, No acute distress  SKIN: No diaphoresis rash HEENT: Unremarkable RESPIRATORY: Breathing is even, unlabored. Lung sounds are clear   CARDIOVASCULAR: Heart RRR no murmurs, rubs or gallops. chronic peripheral edema, waxes and wanes  GASTROINTESTINAL: Abdomen is soft, non-tender, not distended w/ normal bowel sounds.  GENITOURINARY: Bladder non tender, not distended  MUSCULOSKELETAL: No abnormal joints or musculature NEUROLOGIC: Cranial nerves 2-12 grossly intact. Moves all extremities PSYCHIATRIC: Mood and affect appropriate to situation with some dementia, no behavioral issues  Patient Active Problem List   Diagnosis Date Noted  . GERD (gastroesophageal reflux disease) 12/22/2016  . Decreased hearing of both ears 08/19/2016  . Pressure injury of skin 08/15/2016  . Aspiration pneumonia ([place]) 08/14/2016  . Chronic diastolic CHF (congestive heart failure) ([place]) 08/14/2016  . Chronic right shoulder pain 02/09/2016  . Bilateral lower extremity edema 02/02/2016  . Ileitis 01/19/2016  . Pressure ulcer 01/09/2016  . SBO  (small bowel obstruction) ([place]) 01/08/2016  . Narcotic addiction ([place]) 05/15/2015  . Diarrhea 03/30/2015  . Acute pharyngitis 02/26/2015  . Osteoarthritis of right knee 02/22/2015  . Pain in joint, lower leg 10/31/2014  . Ulcer of sacral region, stage 4 ([place]) 04/15/2014  . Moderate aortic stenosis 04/14/2014  . Pre-syncope 04/14/2014  . Hypotension 04/14/2014  . Acute renal failure superimposed on stage 2 chronic kidney disease ([place]) 04/14/2014  . Anemia 04/14/2014  . Protein-calorie malnutrition, severe ([place]) 04/14/2014  . Altered mental status   . Dehydration 04/13/2014  . Altered mental state 04/13/2014  . Acute kidney injury ([place])   . Hypokalemia 03/25/2014  . Syncope 03/16/2014  . Acute encephalopathy 03/16/2014  . Leg swelling 03/02/2014  . Avascular necrosis of bone of right hip ([place]) 09/27/2013  . Primary osteoarthritis of right hip 09/07/2013  . Spinal stenosis, lumbar region, with neurogenic claudication 09/07/2013  . Closed fracture of pubic ramus ([place]) 09/07/2013  . BPPV (benign paroxysmal positional vertigo) 09/07/2013  . Pharyngeal swelling 08/30/2013  . Pelvic fracture ([place]) 08/28/2013  . Fall 08/26/2013  . Dizziness 08/26/2013  . Abnormal stress test 08/26/2013  . Preop cardiovascular exam 08/01/2013  . Aortic stenosis 08/01/2013  . Edema 06/30/2013  . Loss of weight 01/04/2013  . Situational stress 01/04/2013  . Chronic right hip pain 07/05/2012  . Knee pain, acute 06/29/2012  . Vertigo 06/24/2012  . Acute exacerbation of chronic low back pain 06/24/2012  . Lumbar pain with radiation down right leg 06/09/2012  . Osteoarthritis of hip 04/29/2012  . History of TIAs 04/17/2012  . S/P total hysterectomy and bilateral salpingo-oophorectomy 04/17/2012  . Hard of hearing 04/17/2012  . History of anemia 04/17/2012  . RLS (restless legs syndrome) 04/17/2012  . Chronic back pain 04/17/2012  . Spinal stenosis, lumbar 04/17/2012  . Lower back pain 12/20/2011  .  Personal history of colonic polyps 10/08/2011  . H/O 09/14/2011  . Essential hypertension 09/14/2011  . Restless legs syndrome 09/14/2011  . Constipation 08/20/2011  . Urinary incontinence 08/20/2011    CMP     Component Value Date/Time   NA 142 08/17/2016 0343   NA 142 08/17/2016   K 3.9 08/17/2016 0343   CL 106 08/17/2016 0343   CO2 28 08/17/2016 0343   GLUCOSE 92 08/17/2016  0343   BUN 22 (H) 08/17/2016 0343   BUN 22 (A) 08/17/2016   CREATININE 1.13 (H) 08/17/2016 0343   CREATININE 0.78 06/28/2014 1205   CALCIUM 7.8 (L) 08/17/2016 0343   PROT 6.6 08/14/2016 1519   ALBUMIN 3.4 (L) 08/14/2016 1519   AST 28 08/14/2016 1519   ALT 17 08/14/2016 1519   ALKPHOS 107 08/14/2016 1519   BILITOT 1.1 08/14/2016 1519   GFRNONAA 40 (L) 08/17/2016 0343   GFRAA 46 (L) 08/17/2016 0343   Recent Labs    08/15/16 0531 08/16/16 08/16/16 0457 08/17/16 08/17/16 0343  NA 140 147 147* 142 142  K 3.3* 3.1* 3.1*  --  3.9  CL 94*  --  104  --  106  CO2 34*  --  31  --  28  GLUCOSE 86  --  69  --  92  BUN 28* 23* 23* 22* 22*  CREATININE 1.79* 1.2* 1.20* 1.1 1.13*  CALCIUM 8.0*  --  8.0*  --  7.8*  MG  --   --  2.2  --   --    Recent Labs    06/04/16 08/14/16 1519  AST 16 28  ALT 6* 17  ALKPHOS 109 107  BILITOT  --  1.1  PROT  --  6.6  ALBUMIN  --  3.4*   Recent Labs    08/14/16 1519  08/15/16 0531  08/16/16 0457  11/10/16 12/22/16 12/23/16  WBC 21.1*   < > 16.1*   < > 11.4*   < > 8.7 6.5 6.6  NEUTROABS 19.6*  --   --   --   --   --   --   --   --   HGB 9.4*   < > 8.5*  --  8.3*   < > 8.1* 7.5* 7.5*  HCT 31.3*   < > 28.2*  --  27.8*   < > 27* 24* 24*  MCV 81.7  --  82.5  --  83.5  --   --   --   --   PLT 300   < > 256  --  261   < > 322 287 273   < > = values in this interval not displayed.   No results for input(s): CHOL, LDLCALC, TRIG in the last 8760 hours.  Invalid input(s): HCL No results found for: [facility] Lab Results  Component Value Date   TSH 2.46 04/26/2016     Lab Results  Component Value Date   HGBA1C 5.6 03/19/2016   Lab Results  Component Value Date   CHOL 141 03/19/2016   HDL 55 03/19/2016   LDLCALC 71 03/19/2016   TRIG 77 03/19/2016   CHOLHDL 2.8 03/06/2014    Significant Diagnostic Results in last 30 days:  No results found.  Assessment and Plan  Narcotic addiction ([place]) Transfer necrosis of right hip, chronic; patient has done very well with [facility] and they have reduced her regimen considerably; continue MS Contin 50 mg by mouth every 12 hours and oxycodone 5 mg 1 by mouth 3 times a day when necessary which has been reduced from 5 times daily  Essential hypertension Controlled; continue Norvasc5 mg by mouth daily; will continue to monitor  Chronic diastolic CHF (congestive heart failure) ([place]) Chronic and stable without exacerbation; plan to continue Demadex 60 mg Monday Wednesday and Friday and 40 mg daily all other days    [name], MD

## 2017-03-13 ENCOUNTER — Non-Acute Institutional Stay (SKILLED_NURSING_FACILITY): Payer: Medicare Other | Admitting: Internal Medicine

## 2017-03-13 ENCOUNTER — Encounter: Payer: Self-pay | Admitting: Internal Medicine

## 2017-03-13 DIAGNOSIS — G2581 Restless legs syndrome: Secondary | ICD-10-CM

## 2017-03-13 DIAGNOSIS — R600 Localized edema: Secondary | ICD-10-CM

## 2017-03-13 DIAGNOSIS — D508 Other iron deficiency anemias: Secondary | ICD-10-CM | POA: Diagnosis not present

## 2017-03-13 NOTE — Progress Notes (Signed)
Location:  [facility] Room Number: 195K Place of Service:  SNF (31)  Shelly Duos, MD  Patient Care Team: Shelly Duos, MD as PCP - General (Internal Medicine) Shelly Arabian, MD as Consulting Physician (Orthopedic Surgery) Shelly Nunnery, MD as Consulting Physician (Otolaryngology) Mast, Man X, NP as Nurse Practitioner (Internal Medicine)  Extended Emergency Contact Information Primary Emergency Contact: Shelly Martin Address: [place], [place] [address] [place] Phone: [phone] Relation: Daughter Secondary Emergency Contact: Shelly Martin, [place] Mobile Phone: [phone] Relation: Son    Allergies: Amoxicillin; Baclofen; Hctz [hydrochlorothiazide]; and Valium [diazepam]  Chief Complaint  Patient presents with  . Medical Management of Chronic Issues    routine visit    HPI: Patient is 83 y.o. female who is being seen for routine issues of restless leg syndrome, iron deficiency anemia, and bilateral lower extremity edema  Past Medical History:  Diagnosis Date  . Abnormal stress test 08/26/2013  . Acute encephalopathy   . AKI (acute kidney injury) ([place])   . Altered mental state   . Anemia   . Anxiety disorder, unspecified   . Aortic stenosis   . Arthritis    Back, knees  . Avascular necrosis of bone of right hip ([place]) 09/27/2013  . Bowel incontinence   . BPPV (benign paroxysmal positional vertigo) 09/07/2013  . Closed fracture of pubic ramus ([place]) 09/07/2013  . Colon polyps   . Constipation   . Decreased hearing of both ears 08/19/2016   Last Assessment & Plan:  Concern over hearing loss. Slowly progressive hearing loss.  No history of ear surgery, trauma or infection. EXAMINATION shows normal external canals and tympanic membranes. AUDIOGRAM severe sensorineural hearing loss.  Symmetric in nature. PLAN: Reassured I do not see anything  bad going on.  She is a candidate for hearing aids.   will check her benefits.  . Gastric ulcer    years ago  . History of anemia 04/17/2012  . History of TIAs 04/17/2012   June 2011   . Hypertension   . Hypokalemia   . Pelvic fracture ([place]) 08/28/2013  . Personal history of colonic polyps 10/08/2011   Index polypectomy 2013   . Restless legs syndrome    on Requip  . S/P total hysterectomy and bilateral salpingo-oophorectomy 04/17/2012  . Situational stress   . Spinal stenosis   . Spinal stenosis, lumbar 04/17/2012  . Spinal stenosis, lumbar region, with neurogenic claudication 09/07/2013  . Stroke ([place])    Mini, no residual  . TIA (transient ischemic attack)   . Ulcer causing bleeding and hole in wall of stomach or small intestine   . Urinary bladder calculus   . Urinary incontinence   . Vertigo     Past Surgical History:  Procedure Laterality Date  . COLON RESECTION  50 years ago  . TONSILLECTOMY    . TOTAL ABDOMINAL HYSTERECTOMY W/ BILATERAL SALPINGOOPHORECTOMY  04/17/2012  . UTERINE FIBROID SURGERY      Allergies as of 03/13/2017      Reactions   Amoxicillin Other (See Comments)   Reaction unknown   Baclofen Other (See Comments)   "fuzzy in the eyes" and dizzy   Hctz [hydrochlorothiazide]    nausea   Valium [diazepam] Other (See Comments)   Pt became unresponsive and O2 sats dropped.  Medication List        Accurate as of 03/13/17 11:59 PM. Always use your most recent med list.          acetaminophen 325 MG tablet Commonly known as:  TYLENOL Take 325 mg by mouth every 6 (six) hours as needed. Notify MD if not relieved. Not to exceed 3000 mg in 24 hour period   amLODipine 5 MG tablet Commonly known as:  NORVASC Take 5 mg by mouth daily.   CHLORASEPTIC MAX SORE THROAT 1.5-33 % Liqd Generic drug:  Phenol-Glycerin Use as directed 1 spray in the mouth or throat 3 (three) times daily as needed. For throat irritation/pain. If sore throat persist notify  the MD   feeding supplement (PRO-STAT SUGAR FREE 64) Liqd Take 30 mLs by mouth. Two times daily between meals to support weight status   fentaNYL 12 MCG/HR Commonly known as:  DURAGESIC - dosed mcg/hr Place 12.5 mcg onto the skin. Apply 1 patch topically to skin every 3 days for pain   ferrous sulfate 325 (65 FE) MG tablet Take 325 mg by mouth. Take one tablet twice daily   lubiprostone 24 MCG capsule Commonly known as:  AMITIZA Take 24 mcg by mouth 2 (two) times daily with a meal.   mirtazapine 15 MG tablet Commonly known as:  REMERON Take 15 mg by mouth. Take 1 tablet at bedtime   morphine 15 MG 12 hr tablet Commonly known as:  MS CONTIN Take 1 tablet (15 mg total) by mouth 2 (two) times daily.   multivitamin tablet Take 1 tablet by mouth daily.   MYRBETRIQ 50 MG Tb24 tablet Generic drug:  mirabegron ER Take 50 mg by mouth. Take one tablet daily for bladder   ondansetron 4 MG tablet Commonly known as:  ZOFRAN Take 4 mg by mouth every 12 (twelve) hours as needed for nausea or vomiting.   oxyCODONE 5 MG immediate release tablet Commonly known as:  Oxy IR/ROXICODONE Take 5 mg by mouth. Take one tablet three times a day as needed for breakthrough pain   pantoprazole 40 MG tablet Commonly known as:  PROTONIX Take 40 mg by mouth daily.   potassium chloride 10 MEQ tablet Commonly known as:  K-DUR Take 20 mEq by mouth daily. 2 tablets   rOPINIRole 4 MG tablet Commonly known as:  REQUIP Take 4 mg by mouth at bedtime.   SYSTANE BALANCE 0.6 % Soln Generic drug:  Propylene Glycol Place 1 drop into both eyes 2 (two) times daily.   torsemide 20 MG tablet Commonly known as:  DEMADEX Take 60 mg by mouth every Monday, Wednesday, and Friday. 40 mg on Sun, Tues, Thurs, Sat       Meds ordered this encounter  Medications  . fentaNYL (DURAGESIC - DOSED MCG/HR) 12 MCG/HR    Sig: Place 12.5 mcg onto the skin. Apply 1 patch topically to skin every 3 days for pain  .  mirabegron ER (MYRBETRIQ) 50 MG TB24 tablet    Sig: Take 50 mg by mouth. Take one tablet daily for bladder    Immunization History  Administered Date(s) Administered  . Influenza Split 04/15/2012  . Influenza,inj,Quad PF,6+ Mos 03/06/2014  . Influenza-Unspecified 03/01/2016  . PPD Test 08/30/2014, 09/18/2014  . Pneumococcal Polysaccharide-23 08/29/2013    Social History   Tobacco Use  . Smoking status: Never Smoker  . Smokeless tobacco: Never Used  Substance Use Topics  . Alcohol use: Yes    Alcohol/week: 0.0 oz  Comment: rarely    Review of Systems  DATA OBTAINED: from patient, nurse GENERAL:  no fevers, fatigue, appetite changes SKIN: No itching, rash HEENT: No complaint RESPIRATORY: No cough, wheezing, SOB CARDIAC: No chest pain, palpitations,chronic lower extremity edema  GI: No abdominal pain, No N/V/D or constipation, No heartburn or reflux  GU: No dysuria, frequency or urgency, or incontinence  MUSCULOSKELETAL: No unrelieved bone/joint pain NEUROLOGIC: No headache, dizziness  PSYCHIATRIC: some anxiety, no sadness  Vitals:   03/13/17 1454  BP: 118/62  Pulse: 80  Resp: (!) 22  Temp: 98 F (36.7 C)   Body mass index is 18.36 kg/m. Physical Exam  GENERAL APPEARANCE: Alert, conversant, No acute distress  SKIN: No diaphoresis rash HEENT: Unremarkable RESPIRATORY: Breathing is even, unlabored. Lung sounds are clear   CARDIOVASCULAR: Heart RRR no murmurs, rubs or gallops. + peripheral edema Baseline GASTROINTESTINAL: Abdomen is soft, non-tender, not distended w/ normal bowel sounds.  GENITOURINARY: Bladder non tender, not distended  MUSCULOSKELETAL: No abnormal joints or musculature NEUROLOGIC: Cranial nerves 2-12 grossly intact. Moves all extremities PSYCHIATRIC: Mood and affect appropriate to situation with some dementia, no behavioral issues  Patient Active Problem List   Diagnosis Date Noted  . Iron deficiency anemia 03/29/2017  . GERD  (gastroesophageal reflux disease) 12/22/2016  . Decreased hearing of both ears 08/19/2016  . Pressure injury of skin 08/15/2016  . Aspiration pneumonia ([place]) 08/14/2016  . Chronic diastolic CHF (congestive heart failure) ([place]) 08/14/2016  . Chronic right shoulder pain 02/09/2016  . Bilateral lower extremity edema 02/02/2016  . Ileitis 01/19/2016  . Pressure ulcer 01/09/2016  . SBO (small bowel obstruction) ([place]) 01/08/2016  . Narcotic addiction ([place]) 05/15/2015  . Diarrhea 03/30/2015  . Acute pharyngitis 02/26/2015  . Osteoarthritis of right knee 02/22/2015  . Pain in joint, lower leg 10/31/2014  . Ulcer of sacral region, stage 4 ([place]) 04/15/2014  . Moderate aortic stenosis 04/14/2014  . Pre-syncope 04/14/2014  . Hypotension 04/14/2014  . Acute renal failure superimposed on stage 2 chronic kidney disease ([place]) 04/14/2014  . Anemia 04/14/2014  . Protein-calorie malnutrition, severe ([place]) 04/14/2014  . Altered mental status   . Dehydration 04/13/2014  . Altered mental state 04/13/2014  . Acute kidney injury ([place])   . Hypokalemia 03/25/2014  . Syncope 03/16/2014  . Acute encephalopathy 03/16/2014  . Leg swelling 03/02/2014  . Avascular necrosis of bone of right hip ([place]) 09/27/2013  . Primary osteoarthritis of right hip 09/07/2013  . Spinal stenosis, lumbar region, with neurogenic claudication 09/07/2013  . Closed fracture of pubic ramus ([place]) 09/07/2013  . BPPV (benign paroxysmal positional vertigo) 09/07/2013  . Pharyngeal swelling 08/30/2013  . Pelvic fracture ([place]) 08/28/2013  . Fall 08/26/2013  . Dizziness 08/26/2013  . Abnormal stress test 08/26/2013  . Preop cardiovascular exam 08/01/2013  . Aortic stenosis 08/01/2013  . Edema 06/30/2013  . Loss of weight 01/04/2013  . Situational stress 01/04/2013  . Chronic right hip pain 07/05/2012  . Knee pain, acute 06/29/2012  . Vertigo 06/24/2012  . Acute exacerbation of chronic low back pain 06/24/2012  . Lumbar pain with  radiation down right leg 06/09/2012  . Osteoarthritis of hip 04/29/2012  . History of TIAs 04/17/2012  . S/P total hysterectomy and bilateral salpingo-oophorectomy 04/17/2012  . Hard of hearing 04/17/2012  . History of anemia 04/17/2012  . RLS (restless legs syndrome) 04/17/2012  . Chronic back pain 04/17/2012  . Spinal stenosis, lumbar 04/17/2012  . Lower back pain 12/20/2011  . Personal history of  colonic polyps 10/08/2011  . H/O 09/14/2011  . Essential hypertension 09/14/2011  . Restless legs syndrome 09/14/2011  . Constipation 08/20/2011  . Urinary incontinence 08/20/2011    CMP     Component Value Date/Time   NA 142 08/17/2016 0343   NA 142 08/17/2016   K 3.9 08/17/2016 0343   CL 106 08/17/2016 0343   CO2 28 08/17/2016 0343   GLUCOSE 92 08/17/2016 0343   BUN 22 (H) 08/17/2016 0343   BUN 22 (A) 08/17/2016   CREATININE 1.13 (H) 08/17/2016 0343   CREATININE 0.78 06/28/2014 1205   CALCIUM 7.8 (L) 08/17/2016 0343   PROT 6.6 08/14/2016 1519   ALBUMIN 3.4 (L) 08/14/2016 1519   AST 28 08/14/2016 1519   ALT 17 08/14/2016 1519   ALKPHOS 107 08/14/2016 1519   BILITOT 1.1 08/14/2016 1519   GFRNONAA 40 (L) 08/17/2016 0343   GFRAA 46 (L) 08/17/2016 0343   Recent Labs    08/15/16 0531 08/16/16 08/16/16 0457 08/17/16 08/17/16 0343  NA 140 147 147* 142 142  K 3.3* 3.1* 3.1*  --  3.9  CL 94*  --  104  --  106  CO2 34*  --  31  --  28  GLUCOSE 86  --  69  --  92  BUN 28* 23* 23* 22* 22*  CREATININE 1.79* 1.2* 1.20* 1.1 1.13*  CALCIUM 8.0*  --  8.0*  --  7.8*  MG  --   --  2.2  --   --    Recent Labs    06/04/16 08/14/16 1519  AST 16 28  ALT 6* 17  ALKPHOS 109 107  BILITOT  --  1.1  PROT  --  6.6  ALBUMIN  --  3.4*   Recent Labs    08/14/16 1519  08/15/16 0531  08/16/16 0457  11/10/16 12/22/16 12/23/16  WBC 21.1*   < > 16.1*   < > 11.4*   < > 8.7 6.5 6.6  NEUTROABS 19.6*  --   --   --   --   --   --   --   --   HGB 9.4*   < > 8.5*  --  8.3*   < > 8.1* 7.5*  7.5*  HCT 31.3*   < > 28.2*  --  27.8*   < > 27* 24* 24*  MCV 81.7  --  82.5  --  83.5  --   --   --   --   PLT 300   < > 256  --  261   < > 322 287 273   < > = values in this interval not displayed.   No results for input(s): CHOL, LDLCALC, TRIG in the last 8760 hours.  Invalid input(s): HCL No results found for: [facility] Lab Results  Component Value Date   TSH 2.46 04/26/2016   Lab Results  Component Value Date   HGBA1C 5.6 03/19/2016   Lab Results  Component Value Date   CHOL 141 03/19/2016   HDL 55 03/19/2016   LDLCALC 71 03/19/2016   TRIG 77 03/19/2016   CHOLHDL 2.8 03/06/2014    Significant Diagnostic Results in last 30 days:  No results found.  Assessment and Plan  RLS (restless legs syndrome) Chronic and stable, patient has no complaints; continue Requip 4 mg by mouth daily at bedtime  Iron deficiency anemia Several months  Ago patient was founded to have hemoglobin  7.5; she is without symptoms; has started  iron 325 by mouth twice a day; anemia has improved into the eights; plan to continue iron twice a day  Bilateral lower extremity edema Chronic problems which waxes and wanes; recently has been less  Edema; patient tries to wear TED hose whenever possible; plan to continue Demadex 60 mg Monday Wednesday and Friday and 40 mg daily every other day    [name], MD

## 2017-03-22 ENCOUNTER — Encounter: Payer: Self-pay | Admitting: Internal Medicine

## 2017-03-22 NOTE — Assessment & Plan Note (Signed)
Transfer necrosis of right hip, chronic; patient has done very well with [facility] and they have reduced her regimen considerably; continue MS Contin 50 mg by mouth every 12 hours and oxycodone 5 mg 1 by mouth 3 times a day when necessary which has been reduced from 5 times daily

## 2017-03-24 ENCOUNTER — Non-Acute Institutional Stay (SKILLED_NURSING_FACILITY): Payer: Medicare Other | Admitting: Internal Medicine

## 2017-03-24 DIAGNOSIS — K5909 Other constipation: Secondary | ICD-10-CM

## 2017-03-24 DIAGNOSIS — R110 Nausea: Secondary | ICD-10-CM

## 2017-03-25 ENCOUNTER — Encounter: Payer: Self-pay | Admitting: Internal Medicine

## 2017-03-25 NOTE — Progress Notes (Signed)
Location:  [facility] Room Number: 253G Place of Service:  SNF (31)  Shelly Duos, MD  Shelly Martin Care Team: Shelly Duos, MD as PCP - General (Internal Medicine) [name], MD as Consulting Physician (Orthopedic Surgery) [name], MD as Consulting Physician (Otolaryngology) Mast, Man X, NP as Nurse Practitioner (Internal Medicine)  Extended Emergency Contact Information Primary Emergency Contact: [name] Address: [place], [place] [address] [name] of [place] Phone: [phone] Relation: Daughter Secondary Emergency Contact: [place], [place] Mobile Phone: [phone] Relation: Son    Allergies: Amoxicillin; Baclofen; Hctz [hydrochlorothiazide]; and Valium [diazepam]  Chief Complaint  Shelly Martin presents with  . Acute Visit    Shelly Martin doesn't feel well    HPI: Shelly Martin is 83 y.o. female who nursing asked me to see because Shelly Martin is complaining of "not feeling well". Shelly Martin says that she has had some nausea, no vomiting. She says she hasn't had a bowel movement in several days, but when I speak to Larned who is her nurse she assures me that she's had a bowel movement movements, she'll go for several days without one and then have multiple ones in 1 day She is on multiple medications for constipation. However Shelly Martin still gets older she has for Imodium which worsens her constipation. Shelly Martin has early stages of dementia and cannot remember these events any longer.  Past Medical History:  Diagnosis Date  . Abnormal stress test 08/26/2013  . Acute encephalopathy   . AKI (acute kidney injury) ([place])   . Altered mental state   . Anemia   . Anxiety disorder, unspecified   . Aortic stenosis   . Arthritis    Back, knees  . Avascular necrosis of bone of right hip ([place]) 09/27/2013  . Bowel incontinence   . BPPV (benign paroxysmal positional  vertigo) 09/07/2013  . Closed fracture of pubic ramus ([place]) 09/07/2013  . Colon polyps   . Constipation   . Decreased hearing of both ears 08/19/2016   Last Assessment & Plan:  Concern over hearing loss. Slowly progressive hearing loss.  No history of ear surgery, trauma or infection. EXAMINATION shows normal external canals and tympanic membranes. AUDIOGRAM severe sensorineural hearing loss.  Symmetric in nature. PLAN: Reassured I do not see anything bad going on.  She is a candidate for hearing aids.   will check her benefits.  . Gastric ulcer    years ago  . History of anemia 04/17/2012  . History of TIAs 04/17/2012   June 2011   . Hypertension   . Hypokalemia   . Pelvic fracture ([place]) 08/28/2013  . Personal history of colonic polyps 10/08/2011   Index polypectomy 2013   . Restless legs syndrome    on Requip  . S/P total hysterectomy and bilateral salpingo-oophorectomy 04/17/2012  . Situational stress   . Spinal stenosis   . Spinal stenosis, lumbar 04/17/2012  . Spinal stenosis, lumbar region, with neurogenic claudication 09/07/2013  . Stroke ([place])    Mini, no residual  . TIA (transient ischemic attack)   . Ulcer causing bleeding and hole in wall of stomach or small intestine   . Urinary bladder calculus   . Urinary incontinence   . Vertigo     Past Surgical History:  Procedure Laterality Date  . COLON RESECTION  50 years ago  .  LUMBAR LAMINECTOMY/DECOMPRESSION MICRODISCECTOMY  06/25/2011   Procedure: LUMBAR LAMINECTOMY/DECOMPRESSION MICRODISCECTOMY;  Surgeon: Hosie Spangle, MD;  Location: [facility];  Service: Neurosurgery;  Laterality: Right;  RIGHT Lumbar Two-Three hemilaminectomy and microdiskectomy  . TONSILLECTOMY    . TOTAL ABDOMINAL HYSTERECTOMY W/ BILATERAL SALPINGOOPHORECTOMY  04/17/2012  . UTERINE FIBROID SURGERY      Allergies as of 03/24/2017      Reactions   Amoxicillin Other (See Comments)   Reaction unknown   Baclofen Other (See Comments)   "fuzzy in  the eyes" and dizzy   Hctz [hydrochlorothiazide]    nausea   Valium [diazepam] Other (See Comments)   Pt became unresponsive and O2 sats dropped.      Medication List       Accurate as of 03/24/17 11:59 PM. Always use your most recent med list.          acetaminophen 325 MG tablet Commonly known as:  TYLENOL Take 325 mg by mouth every 6 (six) hours as needed. Notify MD if not relieved. Not to exceed 3000 mg in 24 hour period   amLODipine 5 MG tablet Commonly known as:  NORVASC Take 5 mg by mouth daily.   CHLORASEPTIC MAX SORE THROAT 1.5-33 % Liqd Generic drug:  Phenol-Glycerin Use as directed 1 spray in the mouth or throat 3 (three) times daily as needed. For throat irritation/pain. If sore throat persist notify the MD   feeding supplement (PRO-STAT SUGAR FREE 64) Liqd Take 30 mLs by mouth. Two times daily between meals to support weight status   fentaNYL 12 MCG/HR Commonly known as:  DURAGESIC - dosed mcg/hr Place 12.5 mcg onto the skin. Apply 1 patch topically to skin every 3 days for pain   ferrous sulfate 325 (65 FE) MG tablet Take 325 mg by mouth. Take one tablet twice daily   lubiprostone 24 MCG capsule Commonly known as:  AMITIZA Take 24 mcg by mouth 2 (two) times daily with a meal.   mirtazapine 15 MG tablet Commonly known as:  REMERON Take 15 mg by mouth. Take 1 tablet at bedtime   morphine 15 MG 12 hr tablet Commonly known as:  MS CONTIN Take 1 tablet (15 mg total) by mouth 2 (two) times daily.   multivitamin tablet Take 1 tablet by mouth daily.   MYRBETRIQ 50 MG Tb24 tablet Generic drug:  mirabegron ER Take 50 mg by mouth. Take one tablet daily for bladder   ondansetron 4 MG tablet Commonly known as:  ZOFRAN Take 4 mg by mouth every 12 (twelve) hours as needed for nausea or vomiting.   oxyCODONE 5 MG immediate release tablet Commonly known as:  Oxy IR/ROXICODONE Take 5 mg by mouth. Take one tablet three times a day as needed for breakthrough  pain   pantoprazole 40 MG tablet Commonly known as:  PROTONIX Take 40 mg by mouth daily.   potassium chloride 10 MEQ tablet Commonly known as:  K-DUR Take 20 mEq by mouth daily. 2 tablets   rOPINIRole 4 MG tablet Commonly known as:  REQUIP Take 4 mg by mouth at bedtime.   SYSTANE BALANCE 0.6 % Soln Generic drug:  Propylene Glycol Place 1 drop into both eyes 2 (two) times daily.   torsemide 20 MG tablet Commonly known as:  DEMADEX Take 60 mg by mouth every Monday, Wednesday, and Friday. 40 mg on Sun, Tues, Thurs, Sat       No orders of the defined types were placed in this encounter.  Immunization History  Administered Date(s) Administered  . Influenza Split 04/15/2012  . Influenza,inj,Quad PF,6+ Mos 03/06/2014  . Influenza-Unspecified 03/01/2016  . PPD Test 08/30/2014, 09/18/2014  . Pneumococcal Polysaccharide-23 08/29/2013    Social History  Substance Use Topics  . Smoking status: Never Smoker  . Smokeless tobacco: Never Used  . Alcohol use 0.0 oz/week     Comment: rarely    Review of Systems  DATA OBTAINED: from Shelly Martin, nurse GENERAL:  no fevers, fatigue, appetite changes SKIN: No itching, rash HEENT: No complaint RESPIRATORY: No cough, wheezing, SOB CARDIAC: No chest pain, palpitations, lower extremity edema  GI: No abdominal pain, + nausea; noV/D or constipation, No heartburn or reflux  GU: No dysuria, frequency or urgency, or incontinence  MUSCULOSKELETAL: No unrelieved bone/joint pain NEUROLOGIC: No headache, dizziness  PSYCHIATRIC: No overt anxiety or sadness  Vitals:   03/25/17 1350  BP: 116/68  Pulse: 72  Resp: 18  Temp: 97.6 F (36.4 C)  SpO2: 94%   Body mass index is 18.36 kg/m. Physical Exam  GENERAL APPEARANCE: Alert, conversant, No acute distress  SKIN: No diaphoresis rash HEENT: Unremarkable RESPIRATORY: Breathing is even, unlabored. Lung sounds are clear   CARDIOVASCULAR: Heart RRR no murmurs, rubs or gallops. No peripheral  edema  GASTROINTESTINAL: Abdomen is soft, non-tender, not distended w/ fairly active bowel sounds.  GENITOURINARY: Bladder non tender, not distended  MUSCULOSKELETAL: No abnormal joints or musculature NEUROLOGIC: Cranial nerves 2-12 grossly intact. Moves all extremities PSYCHIATRIC: Mood and affect appropriate to situation, no behavioral issues  Shelly Martin Active Problem List   Diagnosis Date Noted  . GERD (gastroesophageal reflux disease) 12/22/2016  . Decreased hearing of both ears 08/19/2016  . Pressure injury of skin 08/15/2016  . Aspiration pneumonia ([place]) 08/14/2016  . Chronic diastolic CHF (congestive heart failure) ([place]) 08/14/2016  . Chronic right shoulder pain 02/09/2016  . Bilateral lower extremity edema 02/02/2016  . Ileitis 01/19/2016  . Pressure ulcer 01/09/2016  . SBO (small bowel obstruction) ([place]) 01/08/2016  . Narcotic addiction ([place]) 05/15/2015  . Diarrhea 03/30/2015  . Acute pharyngitis 02/26/2015  . Osteoarthritis of right knee 02/22/2015  . Pain in joint, lower leg 10/31/2014  . Ulcer of sacral region, stage 4 ([place]) 04/15/2014  . Moderate aortic stenosis 04/14/2014  . Pre-syncope 04/14/2014  . Hypotension 04/14/2014  . Acute renal failure superimposed on stage 2 chronic kidney disease ([place]) 04/14/2014  . Anemia 04/14/2014  . Protein-calorie malnutrition, severe ([place]) 04/14/2014  . Altered mental status   . Dehydration 04/13/2014  . Altered mental state 04/13/2014  . Acute kidney injury ([place])   . Hypokalemia 03/25/2014  . Syncope 03/16/2014  . Acute encephalopathy 03/16/2014  . Leg swelling 03/02/2014  . Avascular necrosis of bone of right hip ([place]) 09/27/2013  . Primary osteoarthritis of right hip 09/07/2013  . Spinal stenosis, lumbar region, with neurogenic claudication 09/07/2013  . Closed fracture of pubic ramus ([place]) 09/07/2013  . BPPV (benign paroxysmal positional vertigo) 09/07/2013  . Pharyngeal swelling 08/30/2013  . Pelvic fracture ([place])  08/28/2013  . Fall 08/26/2013  . Dizziness 08/26/2013  . Abnormal stress test 08/26/2013  . Preop cardiovascular exam 08/01/2013  . Aortic stenosis 08/01/2013  . Edema 06/30/2013  . Loss of weight 01/04/2013  . Situational stress 01/04/2013  . Chronic right hip pain 07/05/2012  . Knee pain, acute 06/29/2012  . Vertigo 06/24/2012  . Acute exacerbation of chronic low back pain 06/24/2012  . Lumbar pain with radiation down right leg 06/09/2012  . Osteoarthritis of  hip 04/29/2012  . History of TIAs 04/17/2012  . S/P total hysterectomy and bilateral salpingo-oophorectomy 04/17/2012  . Hard of hearing 04/17/2012  . History of anemia 04/17/2012  . RLS (restless legs syndrome) 04/17/2012  . Chronic back pain 04/17/2012  . Spinal stenosis, lumbar 04/17/2012  . Lower back pain 12/20/2011  . Personal history of colonic polyps 10/08/2011  . H/O 09/14/2011  . Essential hypertension 09/14/2011  . Restless legs syndrome 09/14/2011  . Constipation 08/20/2011  . Urinary incontinence 08/20/2011    CMP     Component Value Date/Time   NA 142 08/17/2016 0343   NA 142 08/17/2016   K 3.9 08/17/2016 0343   CL 106 08/17/2016 0343   CO2 28 08/17/2016 0343   GLUCOSE 92 08/17/2016 0343   BUN 22 (H) 08/17/2016 0343   BUN 22 (A) 08/17/2016   CREATININE 1.13 (H) 08/17/2016 0343   CREATININE 0.78 06/28/2014 1205   CALCIUM 7.8 (L) 08/17/2016 0343   PROT 6.6 08/14/2016 1519   ALBUMIN 3.4 (L) 08/14/2016 1519   AST 28 08/14/2016 1519   ALT 17 08/14/2016 1519   ALKPHOS 107 08/14/2016 1519   BILITOT 1.1 08/14/2016 1519   GFRNONAA 40 (L) 08/17/2016 0343   GFRAA 46 (L) 08/17/2016 0343    Recent Labs  08/15/16 0531 08/16/16 08/16/16 0457 08/17/16 08/17/16 0343  NA 140 147 147* 142 142  K 3.3* 3.1* 3.1*  --  3.9  CL 94*  --  104  --  106  CO2 34*  --  31  --  28  GLUCOSE 86  --  69  --  92  BUN 28* 23* 23* 22* 22*  CREATININE 1.79* 1.2* 1.20* 1.1 1.13*  CALCIUM 8.0*  --  8.0*  --  7.8*  MG   --   --  2.2  --   --     Recent Labs  06/04/16 08/14/16 1519  AST 16 28  ALT 6* 17  ALKPHOS 109 107  BILITOT  --  1.1  PROT  --  6.6  ALBUMIN  --  3.4*    Recent Labs  08/14/16 1519  08/15/16 0531  08/16/16 0457  11/10/16 12/22/16 12/23/16  WBC 21.1*  < > 16.1*  < > 11.4*  < > 8.7 6.5 6.6  NEUTROABS 19.6*  --   --   --   --   --   --   --   --   HGB 9.4*  < > 8.5*  --  8.3*  < > 8.1* 7.5* 7.5*  HCT 31.3*  < > 28.2*  --  27.8*  < > 27* 24* 24*  MCV 81.7  --  82.5  --  83.5  --   --   --   --   PLT 300  < > 256  --  261  < > 322 287 273  < > = values in this interval not displayed. No results for input(s): CHOL, LDLCALC, TRIG in the last 8760 hours.  Invalid input(s): HCL No results found for: [facility] Lab Results  Component Value Date   TSH 2.46 04/26/2016   Lab Results  Component Value Date   HGBA1C 5.6 03/19/2016   Lab Results  Component Value Date   CHOL 141 03/19/2016   HDL 55 03/19/2016   LDLCALC 71 03/19/2016   TRIG 77 03/19/2016   CHOLHDL 2.8 03/06/2014    Significant Diagnostic Results in last 30 days:  No results found.  Assessment and  Plan  NAUSEA/CHRONIC constipation-plan to continue Amatine 24 g twice a day; Shelly Martin needs this because she is on narcotic pain medications I have told nursing to not give her any Imodiumfor reports of diarrhea unless they actually see the diarrhea; I told Shelly Martin that I would have dietitian come and speak to her about the things she likes to eat and she said that would be wonderful, but the nurses tell me that dietary has already spoken to her; encouraged Shelly Martin that she would be okay, with her dementia she starting to have anxiety about her medical condition; will continue to follow;     Time spent greater than 25 minutes Apolo Cutshaw D. [name], MD

## 2017-03-29 ENCOUNTER — Encounter: Payer: Self-pay | Admitting: Internal Medicine

## 2017-03-29 DIAGNOSIS — D509 Iron deficiency anemia, unspecified: Secondary | ICD-10-CM | POA: Insufficient documentation

## 2017-03-29 NOTE — Assessment & Plan Note (Signed)
Chronic problems which waxes and wanes; recently has been less  Edema; patient tries to wear TED hose whenever possible; plan to continue Demadex 60 mg Monday Wednesday and Friday and 40 mg daily every other day

## 2017-03-29 NOTE — Assessment & Plan Note (Signed)
Several months  Ago patient was founded to have hemoglobin  7.5; she is without symptoms; has started iron 325 by mouth twice a day; anemia has improved into the eights; plan to continue iron twice a day

## 2017-03-29 NOTE — Assessment & Plan Note (Addendum)
Chronic and stable without exacerbation; plan to continue Demadex 60 mg Monday Wednesday and Friday and 40 mg daily all other days

## 2017-03-29 NOTE — Assessment & Plan Note (Signed)
Chronic and stable, patient has no complaints; continue Requip 4 mg by mouth daily at bedtime

## 2017-03-29 NOTE — Assessment & Plan Note (Signed)
Controlled; continue Norvasc5 mg by mouth daily; will continue to monitor

## 2017-04-20 LAB — VITAMIN B12: VITAMIN B 12: 575

## 2017-04-20 LAB — CBC AND DIFFERENTIAL
HEMATOCRIT: 31 — AB (ref 36–46)
HEMOGLOBIN: 9.6 — AB (ref 12.0–16.0)
PLATELETS: 295 (ref 150–399)
WBC: 7.9

## 2017-04-21 ENCOUNTER — Encounter: Payer: Self-pay | Admitting: Internal Medicine

## 2017-04-21 ENCOUNTER — Non-Acute Institutional Stay (SKILLED_NURSING_FACILITY): Payer: Medicare Other | Admitting: Internal Medicine

## 2017-04-21 DIAGNOSIS — K2100 Gastro-esophageal reflux disease with esophagitis, without bleeding: Secondary | ICD-10-CM

## 2017-04-21 DIAGNOSIS — K5901 Slow transit constipation: Secondary | ICD-10-CM | POA: Diagnosis not present

## 2017-04-21 DIAGNOSIS — R600 Localized edema: Secondary | ICD-10-CM | POA: Diagnosis not present

## 2017-04-21 NOTE — Progress Notes (Signed)
Location:  [facility] Room Number: 094B Place of Service:  SNF (31)  [name], MD  Patient Care Team: [name], MD as PCP - General (Internal Medicine) [name], MD as Consulting Physician (Orthopedic Surgery) [name], MD as Consulting Physician (Otolaryngology) Mast, Man X, NP as Nurse Practitioner (Internal Medicine)  Extended Emergency Contact Information Primary Emergency Contact: [name] Address: [address], [place] [address] [name] of [place] Phone: [phone] Relation: Daughter Secondary Emergency Contact: [place], [place] of [place] Mobile Phone: [phone] Relation: Son    Allergies: Amoxicillin; Baclofen; Hctz [hydrochlorothiazide]; and Valium [diazepam]  Chief Complaint  Patient presents with  . Medical Management of Chronic Issues    routine visit    HPI: Patient is 83 y.o. female who is being seen for routine issues of GERD, bilateral lower extremity edema and constipation.  Past Medical History:  Diagnosis Date  . Abnormal stress test 08/26/2013  . Acute encephalopathy   . AKI (acute kidney injury) ([place])   . Altered mental state   . Anemia   . Anxiety disorder, unspecified   . Aortic stenosis   . Arthritis    Back, knees  . Avascular necrosis of bone of right hip ([place]) 09/27/2013  . Bowel incontinence   . BPPV (benign paroxysmal positional vertigo) 09/07/2013  . Closed fracture of pubic ramus ([place]) 09/07/2013  . Colon polyps   . Constipation   . Decreased hearing of both ears 08/19/2016   Last Assessment & Plan:  Concern over hearing loss. Slowly progressive hearing loss.  No history of ear surgery, trauma or infection. EXAMINATION shows normal external canals and tympanic membranes. AUDIOGRAM severe sensorineural hearing loss.  Symmetric in nature. PLAN: Reassured I do not see anything bad going on.  She is a  candidate for hearing aids.   will check her benefits.  . Gastric ulcer    years ago  . History of anemia 04/17/2012  . History of TIAs 04/17/2012   June 2011   . Hypertension   . Hypokalemia   . Pelvic fracture ([place]) 08/28/2013  . Personal history of colonic polyps 10/08/2011   Index polypectomy 2013   . Restless legs syndrome    on Requip  . S/P total hysterectomy and bilateral salpingo-oophorectomy 04/17/2012  . Situational stress   . Spinal stenosis   . Spinal stenosis, lumbar 04/17/2012  . Spinal stenosis, lumbar region, with neurogenic claudication 09/07/2013  . Stroke ([place])    Mini, no residual  . TIA (transient ischemic attack)   . Ulcer causing bleeding and hole in wall of stomach or small intestine   . Urinary bladder calculus   . Urinary incontinence   . Vertigo     Past Surgical History:  Procedure Laterality Date  . COLON RESECTION  50 years ago  . LUMBAR LAMINECTOMY/DECOMPRESSION MICRODISCECTOMY  06/25/2011   Procedure: LUMBAR LAMINECTOMY/DECOMPRESSION MICRODISCECTOMY;  Surgeon: Hosie Spangle, MD;  Location: [facility];  Service: Neurosurgery;  Laterality: Right;  RIGHT Lumbar Two-Three hemilaminectomy and microdiskectomy  . TONSILLECTOMY    . TOTAL ABDOMINAL HYSTERECTOMY W/ BILATERAL SALPINGOOPHORECTOMY  04/17/2012  . UTERINE FIBROID SURGERY      Allergies as of 04/21/2017      Reactions   Amoxicillin Other (See Comments)   Reaction unknown   Baclofen Other (See Comments)   "  fuzzy in the eyes" and dizzy   Hctz [hydrochlorothiazide]    nausea   Valium [diazepam] Other (See Comments)   Pt became unresponsive and O2 sats dropped.      Medication List        Accurate as of 04/21/17 11:59 PM. Always use your most recent med list.          acetaminophen 325 MG tablet Commonly known as:  TYLENOL Take 325 mg by mouth every 6 (six) hours as needed. Take 2 tablets every 6 hours as needed. Notify doctor if not relieve. Not ex exceed 3000 mg in 24 hour  period.   amLODipine 5 MG tablet Commonly known as:  NORVASC Take 5 mg by mouth daily.   feeding supplement (PRO-STAT SUGAR FREE 64) Liqd Take 30 mLs by mouth. Two times daily between meals to support weight status   fentaNYL 12 MCG/HR Commonly known as:  DURAGESIC - dosed mcg/hr Place 12.5 mcg onto the skin. Apply 1 patch topically to skin every 3 days for pain   ferrous sulfate 325 (65 FE) MG tablet Take 325 mg by mouth. Take one tablet twice daily   lubiprostone 24 MCG capsule Commonly known as:  AMITIZA Take 24 mcg by mouth 2 (two) times daily with a meal.   mirtazapine 15 MG tablet Commonly known as:  REMERON Take 15 mg by mouth. Take 1 tablet at bedtime   morphine 15 MG 12 hr tablet Commonly known as:  MS CONTIN Take 1 tablet (15 mg total) by mouth 2 (two) times daily.   multivitamin tablet Take 1 tablet by mouth daily.   MYRBETRIQ 50 MG Tb24 tablet Generic drug:  mirabegron ER Take 50 mg by mouth. Take one tablet daily for bladder   ondansetron 4 MG tablet Commonly known as:  ZOFRAN Take 4 mg by mouth every 12 (twelve) hours as needed for nausea or vomiting.   oxyCODONE 5 MG immediate release tablet Commonly known as:  Oxy IR/ROXICODONE Take 5 mg by mouth. Take one tablet three times a day as needed for breakthrough pain   pantoprazole 20 MG tablet Commonly known as:  PROTONIX Take 20 mg by mouth daily.   potassium chloride 10 MEQ tablet Commonly known as:  K-DUR Take 10 mEq by mouth daily. 2 tablets   rOPINIRole 4 MG tablet Commonly known as:  REQUIP Take 4 mg by mouth at bedtime.   SYSTANE BALANCE 0.6 % Soln Generic drug:  Propylene Glycol Place 1 drop into both eyes 2 (two) times daily.   torsemide 20 MG tablet Commonly known as:  DEMADEX Take 60 mg by mouth every Monday, Wednesday, and Friday. 40 mg on Sun, Tues, Thurs, Sat       No orders of the defined types were placed in this encounter.   Immunization History  Administered Date(s)  Administered  . Influenza Split 04/15/2012  . Influenza,inj,Quad PF,6+ Mos 03/06/2014  . Influenza-Unspecified 03/01/2016, 05/01/2017  . PPD Test 08/30/2014, 09/18/2014  . Pneumococcal Polysaccharide-23 08/29/2013    Social History   Tobacco Use  . Smoking status: Never Smoker  . Smokeless tobacco: Never Used  Substance Use Topics  . Alcohol use: Yes    Alcohol/week: 0.0 oz    Comment: rarely    Review of Systems  DATA OBTAINED: from patient, nurse GENERAL:  no fevers, fatigue, appetite changes SKIN: No itching, rash HEENT: No complaint RESPIRATORY: No cough, wheezing, SOB CARDIAC: No chest pain, palpitations, lower extremity edema  GI: No abdominal  pain, No N/V/D or constipation, No heartburn or reflux  GU: No dysuria, frequency or urgency, or incontinence  MUSCULOSKELETAL: No unrelieved bone/joint pain NEUROLOGIC: No headache, dizziness  PSYCHIATRIC: No overt anxiety or sadness  Vitals:   04/21/17 1634  BP: 124/82  Pulse: 80  Resp: 17  Temp: (!) 96.6 F (35.9 C)   Body mass index is 17.58 kg/m. Physical Exam  GENERAL APPEARANCE: Alert, conversant, No acute distress  SKIN: No diaphoresis rash HEENT: Unremarkable RESPIRATORY: Breathing is even, unlabored. Lung sounds are clear   CARDIOVASCULAR: Heart RRR no murmurs, rubs or gallops. No peripheral edema  GASTROINTESTINAL: Abdomen is soft, non-tender, not distended w/ normal bowel sounds.  GENITOURINARY: Bladder non tender, not distended  MUSCULOSKELETAL: No abnormal joints or musculature NEUROLOGIC: Cranial nerves 2-12 grossly intact. Moves all extremities PSYCHIATRIC: Mood and affect appropriate to situation with dementia, no behavioral issues  Patient Active Problem List   Diagnosis Date Noted  . Iron deficiency anemia 03/29/2017  . GERD (gastroesophageal reflux disease) 12/22/2016  . Decreased hearing of both ears 08/19/2016  . Pressure injury of skin 08/15/2016  . Aspiration pneumonia ([place]) 08/14/2016    . Chronic diastolic CHF (congestive heart failure) ([place]) 08/14/2016  . Chronic right shoulder pain 02/09/2016  . Bilateral lower extremity edema 02/02/2016  . Ileitis 01/19/2016  . Pressure ulcer 01/09/2016  . SBO (small bowel obstruction) ([place]) 01/08/2016  . Narcotic addiction ([place]) 05/15/2015  . Diarrhea 03/30/2015  . Acute pharyngitis 02/26/2015  . Osteoarthritis of right knee 02/22/2015  . Pain in joint, lower leg 10/31/2014  . Ulcer of sacral region, stage 4 ([place]) 04/15/2014  . Moderate aortic stenosis 04/14/2014  . Pre-syncope 04/14/2014  . Hypotension 04/14/2014  . Acute renal failure superimposed on stage 2 chronic kidney disease ([place]) 04/14/2014  . Anemia 04/14/2014  . Protein-calorie malnutrition, severe ([place]) 04/14/2014  . Altered mental status   . Dehydration 04/13/2014  . Altered mental state 04/13/2014  . Acute kidney injury ([place])   . Hypokalemia 03/25/2014  . Syncope 03/16/2014  . Acute encephalopathy 03/16/2014  . Leg swelling 03/02/2014  . Avascular necrosis of bone of right hip ([place]) 09/27/2013  . Primary osteoarthritis of right hip 09/07/2013  . Spinal stenosis, lumbar region, with neurogenic claudication 09/07/2013  . Closed fracture of pubic ramus ([place]) 09/07/2013  . BPPV (benign paroxysmal positional vertigo) 09/07/2013  . Pharyngeal swelling 08/30/2013  . Pelvic fracture ([place]) 08/28/2013  . Fall 08/26/2013  . Dizziness 08/26/2013  . Abnormal stress test 08/26/2013  . Preop cardiovascular exam 08/01/2013  . Aortic stenosis 08/01/2013  . Edema 06/30/2013  . Loss of weight 01/04/2013  . Situational stress 01/04/2013  . Chronic right hip pain 07/05/2012  . Knee pain, acute 06/29/2012  . Vertigo 06/24/2012  . Acute exacerbation of chronic low back pain 06/24/2012  . Lumbar pain with radiation down right leg 06/09/2012  . Osteoarthritis of hip 04/29/2012  . History of TIAs 04/17/2012  . S/P total hysterectomy and bilateral salpingo-oophorectomy  04/17/2012  . Hard of hearing 04/17/2012  . History of anemia 04/17/2012  . RLS (restless legs syndrome) 04/17/2012  . Chronic back pain 04/17/2012  . Spinal stenosis, lumbar 04/17/2012  . Lower back pain 12/20/2011  . Personal history of colonic polyps 10/08/2011  . H/O 09/14/2011  . Essential hypertension 09/14/2011  . Restless legs syndrome 09/14/2011  . Constipation 08/20/2011  . Urinary incontinence 08/20/2011    CMP     Component Value Date/Time   NA 142 08/17/2016 0343  NA 142 08/17/2016   K 3.9 08/17/2016 0343   CL 106 08/17/2016 0343   CO2 28 08/17/2016 0343   GLUCOSE 92 08/17/2016 0343   BUN 22 (H) 08/17/2016 0343   BUN 22 (A) 08/17/2016   CREATININE 1.13 (H) 08/17/2016 0343   CREATININE 0.78 06/28/2014 1205   CALCIUM 7.8 (L) 08/17/2016 0343   PROT 6.6 08/14/2016 1519   ALBUMIN 3.4 (L) 08/14/2016 1519   AST 28 08/14/2016 1519   ALT 17 08/14/2016 1519   ALKPHOS 107 08/14/2016 1519   BILITOT 1.1 08/14/2016 1519   GFRNONAA 40 (L) 08/17/2016 0343   GFRAA 46 (L) 08/17/2016 0343   Recent Labs    08/15/16 0531 08/16/16 08/16/16 0457 08/17/16 08/17/16 0343  NA 140 147 147* 142 142  K 3.3* 3.1* 3.1*  --  3.9  CL 94*  --  104  --  106  CO2 34*  --  31  --  28  GLUCOSE 86  --  69  --  92  BUN 28* 23* 23* 22* 22*  CREATININE 1.79* 1.2* 1.20* 1.1 1.13*  CALCIUM 8.0*  --  8.0*  --  7.8*  MG  --   --  2.2  --   --    Recent Labs    06/04/16 08/14/16 1519  AST 16 28  ALT 6* 17  ALKPHOS 109 107  BILITOT  --  1.1  PROT  --  6.6  ALBUMIN  --  3.4*   Recent Labs    08/14/16 1519  08/15/16 0531  08/16/16 0457  12/22/16 12/23/16 04/20/17  WBC 21.1*   < > 16.1*   < > 11.4*   < > 6.5 6.6 7.9  NEUTROABS 19.6*  --   --   --   --   --   --   --   --   HGB 9.4*   < > 8.5*  --  8.3*   < > 7.5* 7.5* 9.6*  HCT 31.3*   < > 28.2*  --  27.8*   < > 24* 24* 31*  MCV 81.7  --  82.5  --  83.5  --   --   --   --   PLT 300   < > 256  --  261   < > 287 273 295   < > =  values in this interval not displayed.   No results for input(s): CHOL, LDLCALC, TRIG in the last 8760 hours.  Invalid input(s): HCL No results found for: [facility] Lab Results  Component Value Date   TSH 2.46 04/26/2016   Lab Results  Component Value Date   HGBA1C 5.6 03/19/2016   Lab Results  Component Value Date   CHOL 141 03/19/2016   HDL 55 03/19/2016   LDLCALC 71 03/19/2016   TRIG 77 03/19/2016   CHOLHDL 2.8 03/06/2014    Significant Diagnostic Results in last 30 days:  No results found.  Assessment and Plan  GERD (gastroesophageal reflux disease) Stable; continue omeprazole which is when she DR 10-20 mg by mouth daily  Bilateral lower extremity edema Chronic, waxes and wanes; laser looking good at this time; plan to continue Demadex 60 mg Monday Wednesday Friday and 40 mg daily every other day  Constipation Stable; continue Amatine is a 24 g by mouth 2 times a day    Davina Howlett D. [name], MD

## 2017-05-14 ENCOUNTER — Non-Acute Institutional Stay (SKILLED_NURSING_FACILITY): Payer: Medicare Other | Admitting: Internal Medicine

## 2017-05-14 ENCOUNTER — Encounter: Payer: Self-pay | Admitting: Internal Medicine

## 2017-05-14 DIAGNOSIS — F1120 Opioid dependence, uncomplicated: Secondary | ICD-10-CM | POA: Diagnosis not present

## 2017-05-14 DIAGNOSIS — G2581 Restless legs syndrome: Secondary | ICD-10-CM | POA: Diagnosis not present

## 2017-05-14 DIAGNOSIS — D508 Other iron deficiency anemias: Secondary | ICD-10-CM

## 2017-05-14 NOTE — Progress Notes (Signed)
Location:  [facility] Room Number: [place]:  SNF (31)  [name], MD  Patient Care Team: [name], MD as PCP - General (Internal Medicine) [name], MD as Consulting Physician (Orthopedic Surgery) [name], MD as Consulting Physician (Otolaryngology) Mast, Man X, NP as Nurse Practitioner (Internal Medicine)  Extended Emergency Contact Information Primary Emergency Contact: [name] Address: [place], [place] [address] [name] of [place] Phone: [phone] Relation: Daughter Secondary Emergency Contact: [place], [place] of [place] Mobile Phone: [phone] Relation: Son    Allergies: Amoxicillin; Baclofen; Hctz [hydrochlorothiazide]; and Valium [diazepam]  Chief Complaint  Patient presents with  . Medical Management of Chronic Issues    routine visit    HPI: Patient is 83 y.o. female who is being seen for routine issues of restless leg syndrome, narcotic addiction, and iron deficiency anemia.  Past Medical History:  Diagnosis Date  . Abnormal stress test 08/26/2013  . Acute encephalopathy   . AKI (acute kidney injury) ([place])   . Altered mental state   . Anemia   . Anxiety disorder, unspecified   . Aortic stenosis   . Arthritis    Back, knees  . Avascular necrosis of bone of right hip ([place]) 09/27/2013  . Bowel incontinence   . BPPV (benign paroxysmal positional vertigo) 09/07/2013  . Closed fracture of pubic ramus ([place]) 09/07/2013  . Colon polyps   . Constipation   . Decreased hearing of both ears 08/19/2016   Last Assessment & Plan:  Concern over hearing loss. Slowly progressive hearing loss.  No history of ear surgery, trauma or infection. EXAMINATION shows normal external canals and tympanic membranes. AUDIOGRAM severe sensorineural hearing loss.  Symmetric in nature. PLAN: Reassured I do not see anything bad going on.   She is a candidate for hearing aids.   will check her benefits.  . Gastric ulcer    years ago  . History of anemia 04/17/2012  . History of TIAs 04/17/2012   June 2011   . Hypertension   . Hypokalemia   . Pelvic fracture ([place]) 08/28/2013  . Personal history of colonic polyps 10/08/2011   Index polypectomy 2013   . Restless legs syndrome    on Requip  . S/P total hysterectomy and bilateral salpingo-oophorectomy 04/17/2012  . Situational stress   . Spinal stenosis   . Spinal stenosis, lumbar 04/17/2012  . Spinal stenosis, lumbar region, with neurogenic claudication 09/07/2013  . Stroke ([place])    Mini, no residual  . TIA (transient ischemic attack)   . Ulcer causing bleeding and hole in wall of stomach or small intestine   . Urinary bladder calculus   . Urinary incontinence   . Vertigo     Past Surgical History:  Procedure Laterality Date  . COLON RESECTION  50 years ago  . LUMBAR LAMINECTOMY/DECOMPRESSION MICRODISCECTOMY  06/25/2011   Procedure: LUMBAR LAMINECTOMY/DECOMPRESSION MICRODISCECTOMY;  Surgeon: Hosie Spangle, MD;  Location: [facility];  Service: Neurosurgery;  Laterality: Right;  RIGHT Lumbar Two-Three hemilaminectomy and microdiskectomy  . TONSILLECTOMY    . TOTAL ABDOMINAL HYSTERECTOMY W/ BILATERAL SALPINGOOPHORECTOMY  04/17/2012  . UTERINE FIBROID SURGERY      Allergies as of 05/14/2017      Reactions   Amoxicillin Other (See Comments)   Reaction unknown   Baclofen Other (See  Comments)   "fuzzy in the eyes" and dizzy   Hctz [hydrochlorothiazide]    nausea   Valium [diazepam] Other (See Comments)   Pt became unresponsive and O2 sats dropped.      Medication List        Accurate as of 05/14/17 11:59 PM. Always use your most recent med list.          acetaminophen 325 MG tablet Commonly known as:  TYLENOL Take 325 mg by mouth every 6 (six) hours as needed. Take 2 tablets every 6 hours as needed. Notify doctor if not relieve. Not ex exceed 3000 mg in  24 hour period.   amLODipine 5 MG tablet Commonly known as:  NORVASC Take 5 mg by mouth daily.   feeding supplement (PRO-STAT SUGAR FREE 64) Liqd Take 30 mLs by mouth. Two times daily between meals to support weight status   fentaNYL 12 MCG/HR Commonly known as:  DURAGESIC - dosed mcg/hr Place 12.5 mcg onto the skin. Apply 1 patch topically to skin every 3 days for pain   ferrous sulfate 325 (65 FE) MG tablet Take 325 mg by mouth. Take one tablet twice daily   lubiprostone 24 MCG capsule Commonly known as:  AMITIZA Take 24 mcg by mouth 2 (two) times daily with a meal.   mirtazapine 15 MG tablet Commonly known as:  REMERON Take 15 mg by mouth. Take 1 tablet at bedtime   morphine 15 MG 12 hr tablet Commonly known as:  MS CONTIN Take 1 tablet (15 mg total) by mouth 2 (two) times daily.   multivitamin tablet Take 1 tablet by mouth daily.   MYRBETRIQ 50 MG Tb24 tablet Generic drug:  mirabegron ER Take 50 mg by mouth. Take one tablet daily for bladder   ondansetron 4 MG tablet Commonly known as:  ZOFRAN Take 4 mg by mouth every 12 (twelve) hours as needed for nausea or vomiting.   oxyCODONE 5 MG immediate release tablet Commonly known as:  Oxy IR/ROXICODONE Take 5 mg by mouth. Take one tablet three times a day as needed for breakthrough pain   pantoprazole 20 MG tablet Commonly known as:  PROTONIX Take 20 mg by mouth daily.   potassium chloride 10 MEQ tablet Commonly known as:  K-DUR Take 10 mEq by mouth daily. 2 tablets   rOPINIRole 4 MG tablet Commonly known as:  REQUIP Take 4 mg by mouth at bedtime.   SYSTANE BALANCE 0.6 % Soln Generic drug:  Propylene Glycol Place 1 drop into both eyes 2 (two) times daily.   torsemide 20 MG tablet Commonly known as:  DEMADEX Take 60 mg by mouth every Monday, Wednesday, and Friday. 40 mg on Sun, Tues, Thurs, Sat       No orders of the defined types were placed in this encounter.   Immunization History  Administered  Date(s) Administered  . Influenza Split 04/15/2012  . Influenza,inj,Quad PF,6+ Mos 03/06/2014  . Influenza-Unspecified 03/01/2016, 05/01/2017  . PPD Test 08/30/2014, 09/18/2014  . Pneumococcal Polysaccharide-23 08/29/2013    Social History   Tobacco Use  . Smoking status: Never Smoker  . Smokeless tobacco: Never Used  Substance Use Topics  . Alcohol use: Yes    Alcohol/week: 0.0 oz    Comment: rarely    Review of Systems  DATA OBTAINED: from patient, nurse, medical record, family member GENERAL:  no fevers, fatigue, appetite HEENT: No complaint RESPIRATORY: No cough, wheezing, SOB CARDIAC: No chest pain, palpitations, lower extremity edema  GI:  No abdominal pain, No N/V/D or constipation, No heartburn or reflux  GU: No dysuria, frequency or urgency, or incontinence  MUSCULOSKELETAL: No unrelieved bone/joint pain NEUROLOGIC: No headache, dizziness  PSYCHIATRIC: No overt anxiety or sadness  Vitals:   05/14/17 1400  BP: (!) 90/56  Pulse: 70  Resp: 20  Temp: (!) 97.1 F (36.2 C)  SpO2: 98%   Body mass index is 18.12 kg/m. Physical Exam  GENERAL APPEARANCE: Alert, conversant, No acute distress  SKIN: No diaphoresis rash HEENT: Unremarkable RESPIRATORY: Breathing is even, unlabored. Lung sounds are clear   CARDIOVASCULAR: Heart RRR no murmurs, rubs or gallops. No peripheral edema  GASTROINTESTINAL: Abdomen is soft, non-tender, not distended w/ normal bowel sounds.  GENITOURINARY: Bladder non tender, not distended  MUSCULOSKELETAL: No abnormal joints or musculature NEUROLOGIC: Cranial nerves 2-12 grossly intact. Moves all extremities PSYCHIATRIC: Mood and affect appropriate to situation with dementia, no behavioral issues  Patient Active Problem List   Diagnosis Date Noted  . Iron deficiency anemia 03/29/2017  . GERD (gastroesophageal reflux disease) 12/22/2016  . Decreased hearing of both ears 08/19/2016  . Pressure injury of skin 08/15/2016  . Aspiration  pneumonia ([place]) 08/14/2016  . Chronic diastolic CHF (congestive heart failure) ([place]) 08/14/2016  . Chronic right shoulder pain 02/09/2016  . Bilateral lower extremity edema 02/02/2016  . Ileitis 01/19/2016  . Pressure ulcer 01/09/2016  . SBO (small bowel obstruction) ([place]) 01/08/2016  . Narcotic addiction ([place]) 05/15/2015  . Diarrhea 03/30/2015  . Acute pharyngitis 02/26/2015  . Osteoarthritis of right knee 02/22/2015  . Pain in joint, lower leg 10/31/2014  . Ulcer of sacral region, stage 4 ([place]) 04/15/2014  . Moderate aortic stenosis 04/14/2014  . Pre-syncope 04/14/2014  . Hypotension 04/14/2014  . Acute renal failure superimposed on stage 2 chronic kidney disease ([place]) 04/14/2014  . Anemia 04/14/2014  . Protein-calorie malnutrition, severe ([place]) 04/14/2014  . Altered mental status   . Dehydration 04/13/2014  . Altered mental state 04/13/2014  . Acute kidney injury ([place])   . Hypokalemia 03/25/2014  . Syncope 03/16/2014  . Acute encephalopathy 03/16/2014  . Leg swelling 03/02/2014  . Avascular necrosis of bone of right hip ([place]) 09/27/2013  . Primary osteoarthritis of right hip 09/07/2013  . Spinal stenosis, lumbar region, with neurogenic claudication 09/07/2013  . Closed fracture of pubic ramus ([place]) 09/07/2013  . BPPV (benign paroxysmal positional vertigo) 09/07/2013  . Pharyngeal swelling 08/30/2013  . Pelvic fracture ([place]) 08/28/2013  . Fall 08/26/2013  . Dizziness 08/26/2013  . Abnormal stress test 08/26/2013  . Preop cardiovascular exam 08/01/2013  . Aortic stenosis 08/01/2013  . Edema 06/30/2013  . Loss of weight 01/04/2013  . Situational stress 01/04/2013  . Chronic right hip pain 07/05/2012  . Knee pain, acute 06/29/2012  . Vertigo 06/24/2012  . Acute exacerbation of chronic low back pain 06/24/2012  . Lumbar pain with radiation down right leg 06/09/2012  . Osteoarthritis of hip 04/29/2012  . History of TIAs 04/17/2012  . S/P total hysterectomy and bilateral  salpingo-oophorectomy 04/17/2012  . Hard of hearing 04/17/2012  . History of anemia 04/17/2012  . RLS (restless legs syndrome) 04/17/2012  . Chronic back pain 04/17/2012  . Spinal stenosis, lumbar 04/17/2012  . Lower back pain 12/20/2011  . Personal history of colonic polyps 10/08/2011  . H/O 09/14/2011  . Essential hypertension 09/14/2011  . Restless legs syndrome 09/14/2011  . Constipation 08/20/2011  . Urinary incontinence 08/20/2011    CMP     Component Value Date/Time  NA 142 08/17/2016 0343   NA 142 08/17/2016   K 3.9 08/17/2016 0343   CL 106 08/17/2016 0343   CO2 28 08/17/2016 0343   GLUCOSE 92 08/17/2016 0343   BUN 22 (H) 08/17/2016 0343   BUN 22 (A) 08/17/2016   CREATININE 1.13 (H) 08/17/2016 0343   CREATININE 0.78 06/28/2014 1205   CALCIUM 7.8 (L) 08/17/2016 0343   PROT 6.6 08/14/2016 1519   ALBUMIN 3.4 (L) 08/14/2016 1519   AST 28 08/14/2016 1519   ALT 17 08/14/2016 1519   ALKPHOS 107 08/14/2016 1519   BILITOT 1.1 08/14/2016 1519   GFRNONAA 40 (L) 08/17/2016 0343   GFRAA 46 (L) 08/17/2016 0343   Recent Labs    08/15/16 0531 08/16/16 08/16/16 0457 08/17/16 08/17/16 0343  NA 140 147 147* 142 142  K 3.3* 3.1* 3.1*  --  3.9  CL 94*  --  104  --  106  CO2 34*  --  31  --  28  GLUCOSE 86  --  69  --  92  BUN 28* 23* 23* 22* 22*  CREATININE 1.79* 1.2* 1.20* 1.1 1.13*  CALCIUM 8.0*  --  8.0*  --  7.8*  MG  --   --  2.2  --   --    Recent Labs    08/14/16 1519  AST 28  ALT 17  ALKPHOS 107  BILITOT 1.1  PROT 6.6  ALBUMIN 3.4*   Recent Labs    08/14/16 1519  08/15/16 0531  08/16/16 0457  12/22/16 12/23/16 04/20/17  WBC 21.1*   < > 16.1*   < > 11.4*   < > 6.5 6.6 7.9  NEUTROABS 19.6*  --   --   --   --   --   --   --   --   HGB 9.4*   < > 8.5*  --  8.3*   < > 7.5* 7.5* 9.6*  HCT 31.3*   < > 28.2*  --  27.8*   < > 24* 24* 31*  MCV 81.7  --  82.5  --  83.5  --   --   --   --   PLT 300   < > 256  --  261   < > 287 273 295   < > = values in this  interval not displayed.   No results for input(s): CHOL, LDLCALC, TRIG in the last 8760 hours.  Invalid input(s): HCL No results found for: [facility] Lab Results  Component Value Date   TSH 2.46 04/26/2016   Lab Results  Component Value Date   HGBA1C 5.6 03/19/2016   Lab Results  Component Value Date   CHOL 141 03/19/2016   HDL 55 03/19/2016   LDLCALC 71 03/19/2016   TRIG 77 03/19/2016   CHOLHDL 2.8 03/06/2014    Significant Diagnostic Results in last 30 days:  No results found.  Assessment and Plan  RLS (restless legs syndrome) Patient without complaints; continue Requip 4 mg by mouth daily at bedtime  Narcotic addiction ([place]) Patient is necrosis of hip, chronic low back pain chronic shoulder pain; is followed by [facility] pain clinic which has done a very good job with her pain medications; patient is currently on MS Contin 50 mg by mouth every 12 and fentanyl patch 12 g every 72 hours with oxycodone 5 mg 3 times a day when necessary for breakthrough pain  Iron deficiency anemia Recent hemoglobin was 9.6 which is improved from prior;  continue iron 325 mg by mouth twice a day    [name], MD

## 2017-05-24 ENCOUNTER — Encounter: Payer: Self-pay | Admitting: Internal Medicine

## 2017-05-24 NOTE — Assessment & Plan Note (Signed)
Chronic, waxes and wanes; laser looking good at this time; plan to continue Demadex 60 mg Monday Wednesday Friday and 40 mg daily every other day

## 2017-05-24 NOTE — Assessment & Plan Note (Signed)
Stable; continue Amatine is a 24 g by mouth 2 times a day

## 2017-05-24 NOTE — Assessment & Plan Note (Signed)
Stable; continue omeprazole which is when she DR 10-20 mg by mouth daily

## 2017-06-17 ENCOUNTER — Encounter: Payer: Self-pay | Admitting: Internal Medicine

## 2017-06-17 NOTE — Assessment & Plan Note (Signed)
Patient is necrosis of hip, chronic low back pain chronic shoulder pain; is followed by [facility] pain clinic which has done a very good job with her pain medications; patient is currently on MS Contin 50 mg by mouth every 12 and fentanyl patch 12 g every 72 hours with oxycodone 5 mg 3 times a day when necessary for breakthrough pain

## 2017-06-17 NOTE — Assessment & Plan Note (Signed)
Recent hemoglobin was 9.6 which is improved from prior; continue iron 325 mg by mouth twice a day

## 2017-06-17 NOTE — Assessment & Plan Note (Signed)
Patient without complaints; continue Requip 4 mg by mouth daily at bedtime

## 2017-06-19 ENCOUNTER — Encounter: Payer: Self-pay | Admitting: Internal Medicine

## 2017-06-19 ENCOUNTER — Non-Acute Institutional Stay (SKILLED_NURSING_FACILITY): Payer: Medicare Other | Admitting: Internal Medicine

## 2017-06-19 DIAGNOSIS — I10 Essential (primary) hypertension: Secondary | ICD-10-CM

## 2017-06-19 DIAGNOSIS — I5032 Chronic diastolic (congestive) heart failure: Secondary | ICD-10-CM | POA: Diagnosis not present

## 2017-06-19 DIAGNOSIS — M48062 Spinal stenosis, lumbar region with neurogenic claudication: Secondary | ICD-10-CM | POA: Diagnosis not present

## 2017-06-19 NOTE — Progress Notes (Signed)
Location:  [facility] Room Number: 303-D Place of Service:  SNF (31)  [name], MD  Patient Care Team: [name], MD as PCP - General (Internal Medicine) [name], MD as Consulting Physician (Orthopedic Surgery) [name], MD as Consulting Physician (Otolaryngology) Mast, Man X, NP as Nurse Practitioner (Internal Medicine)  Extended Emergency Contact Information Primary Emergency Contact: [name] Address: [place], [place] [address] [name] of [place] Phone: [phone] Relation: Daughter Secondary Emergency Contact: [place], [place] Mobile Phone: [phone] Relation: Son    Allergies: Amoxicillin; Baclofen; Hctz [hydrochlorothiazide]; and Valium [diazepam]  Chief Complaint  Patient presents with  . Medical Management of Chronic Issues    Routine Visit     HPI: Patient is 84 y.o. female who is being seen for routine issues of chronic diastolic congestive heart failure, hypertension, and lumbar spinal stenosis with low back pain.  Past Medical History:  Diagnosis Date  . Abnormal stress test 08/26/2013  . Acute encephalopathy   . AKI (acute kidney injury) ([place])   . Altered mental state   . Anemia   . Anxiety disorder, unspecified   . Aortic stenosis   . Arthritis    Back, knees  . Avascular necrosis of bone of right hip ([place]) 09/27/2013  . Bowel incontinence   . BPPV (benign paroxysmal positional vertigo) 09/07/2013  . Closed fracture of pubic ramus ([place]) 09/07/2013  . Colon polyps   . Constipation   . Decreased hearing of both ears 08/19/2016   Last Assessment & Plan:  Concern over hearing loss. Slowly progressive hearing loss.  No history of ear surgery, trauma or infection. EXAMINATION shows normal external canals and tympanic membranes. AUDIOGRAM severe sensorineural hearing loss.  Symmetric in nature. PLAN:  Reassured I do not see anything bad going on.  She is a candidate for hearing aids.   will check her benefits.  . Gastric ulcer    years ago  . History of anemia 04/17/2012  . History of TIAs 04/17/2012   June 2011   . Hypertension   . Hypokalemia   . Pelvic fracture ([place]) 08/28/2013  . Personal history of colonic polyps 10/08/2011   Index polypectomy 2013   . Restless legs syndrome    on [place]qu[place]  . S/P total hysterectomy and bilateral salpingo-oophorectomy 04/17/2012  . Situational stress   . Spinal stenosis   . Spinal stenosis, lumbar 04/17/2012  . Spinal stenosis, lumbar region, with neurogenic claudication 09/07/2013  . Stroke ([place])    Mini, no residual  . TIA (transient ischemic attack)   . Ulcer causing bleeding and hole in wall of stomach or small intestine   . Urinary bladder calculus   . Urinary incontinence   . Vertigo     Past Surgical History:  Procedure Laterality Date  . COLON RESECTION  50 years ago  . LUMBAR LAMINECTOMY/DECOMPRESSION MICRODISCECTOMY  06/25/2011   Procedure: LUMBAR LAMINECTOMY/DECOMPRESSION MICRODISCECTOMY;  Surgeon: Hosie Spangle, MD;  Location: [facility];  Service: Neurosurgery;  Laterality: Right;  RIGHT Lumbar Two-Three hemilaminectomy and microdiskectomy  . TONSILLECTOMY    . TOTAL ABDOMINAL HYSTERECTOMY W/ BILATERAL SALPINGOOPHORECTOMY  04/17/2012  . UTERINE FIBROID SURGERY      Allergies as of 06/19/2017      Reactions   Amoxicillin Other (See Comments)  Reaction unknown   Baclofen Other (See Comments)   "fuzzy in the eyes" and dizzy   Hctz [hydrochlorothiazide]    nausea   Valium [diazepam] Other (See Comments)   Pt became unresponsive and O2 sats dropped.      Medication List        Accurate as of 06/19/17 11:59 PM. Always use your most recent med list.          acetaminophen 325 MG tablet Commonly known as:  TYLENOL Take 325 mg by mouth every 6 (six) hours as needed. Take 2 tablets every 6 hours as needed. Notify  doctor if not relieve. Not ex exceed 3000 mg in 24 hour period.   amLODipine 5 MG tablet Commonly known as:  NORVASC Take 5 mg by mouth daily.   feeding supplement (PRO-STAT SUGAR FREE 64) Liqd Take 30 mLs by mouth. Two times daily between meals to support weight status   fentaNYL 12 MCG/HR Commonly known as:  DURAGESIC - dosed mcg/hr Place 12.5 mcg onto the skin. Apply 1 patch topically to skin every 3 days for pain   ferrous sulfate 325 (65 FE) MG tablet Take 325 mg by mouth. Take one tablet twice daily   lubiprostone 24 MCG capsule Commonly known as:  AMITIZA Take 24 mcg by mouth 2 (two) times daily with a meal.   mirtazapine 15 MG tablet Commonly known as:  REMERON Take 15 mg by mouth. Take 1 tablet at bedtime   morphine 15 MG 12 hr tablet Commonly known as:  MS CONTIN Take 1 tablet (15 mg total) by mouth 2 (two) times daily.   multivitamin tablet Take 1 tablet by mouth daily.   MYRBETRIQ 50 MG Tb24 tablet Generic drug:  mirabegron ER Take 50 mg by mouth. Take one tablet daily for bladder   ondansetron 4 MG tablet Commonly known as:  ZOFRAN Take 4 mg by mouth every 12 (twelve) hours as needed for nausea or vomiting.   oxyCODONE 5 MG immediate release tablet Commonly known as:  Oxy IR/ROXICODONE Take 5 mg by mouth. Take one tablet three times a day as needed for breakthrough pain   pantoprazole 20 MG tablet Commonly known as:  PROTONIX Take 20 mg by mouth daily.   potassium chloride 10 MEQ tablet Commonly known as:  K-DUR Take 10 mEq by mouth daily. 2 tablets   rOPINIRole 4 MG tablet Commonly known as:  REQUIP Take 4 mg by mouth at bedtime.   SYSTANE BALANCE 0.6 % Soln Generic drug:  Propylene Glycol Place 1 drop into both eyes 2 (two) times daily.   torsemide 20 MG tablet Commonly known as:  DEMADEX Take 60 mg by mouth every Monday, Wednesday, and Friday. 40 mg on Sun, Tues, Thurs, Sat       No orders of the defined types were placed in this  encounter.   Immunization History  Administered Date(s) Administered  . Influenza Split 04/15/2012  . Influenza,inj,Quad PF,6+ Mos 03/06/2014  . Influenza-Unspecified 03/01/2016, 05/01/2017  . PPD Test 08/30/2014, 09/18/2014  . Pneumococcal Polysaccharide-23 08/29/2013    Social History   Tobacco Use  . Smoking status: Never Smoker  . Smokeless tobacco: Never Used  Substance Use Topics  . Alcohol use: Yes    Alcohol/week: 0.0 oz    Comment: rarely    Review of Systems  DATA OBTAINED: from patient, nurse GENERAL:  no fevers, fatigue, appetite changes SKIN: No itching, rash HEENT: No complaint RESPIRATORY: No cough, wheezing, SOB CARDIAC: No  chest pain, palpitations, lower extremity edema  GI: No abdominal pain, No N/V/D or constipation, No heartburn or reflux  GU: No dysuria, frequency or urgency, or incontinence  MUSCULOSKELETAL: No unrelieved bone/joint pain NEUROLOGIC: No headache, dizziness  PSYCHIATRIC: No overt anxiety or sadness  Vitals:   06/26/17 2104  BP: 122/69  Pulse: 78  Resp: 16  Temp: (!) 97.5 F (36.4 C)   There is no height or weight on file to calculate BMI. Physical Exam  GENERAL APPEARANCE: Alert, conversant, No acute distress  SKIN: No diaphoresis rash HEENT: Unremarkable RESPIRATORY: Breathing is even, unlabored. Lung sounds are clear   CARDIOVASCULAR: Heart RRR no murmurs, rubs or gallops. No peripheral edema  GASTROINTESTINAL: Abdomen is soft, non-tender, not distended w/ normal bowel sounds.  GENITOURINARY: Bladder non tender, not distended  MUSCULOSKELETAL: No abnormal joints or musculature NEUROLOGIC: Cranial nerves 2-12 grossly intact. Moves all extremities PSYCHIATRIC: Mood and affect appropriate to situation with dementia, no behavioral issues  Patient Active Problem List   Diagnosis Date Noted  . Iron deficiency anemia 03/29/2017  . GERD (gastroesophageal reflux disease) 12/22/2016  . Decreased hearing of both ears  08/19/2016  . Pressure injury of skin 08/15/2016  . Aspiration pneumonia ([place]) 08/14/2016  . Chronic diastolic CHF (congestive heart failure) ([place]) 08/14/2016  . Chronic right shoulder pain 02/09/2016  . Bilateral lower extremity edema 02/02/2016  . Ileitis 01/19/2016  . Pressure ulcer 01/09/2016  . SBO (small bowel obstruction) ([place]) 01/08/2016  . Narcotic addiction ([place]) 05/15/2015  . Diarrhea 03/30/2015  . Acute pharyngitis 02/26/2015  . Osteoarthritis of right knee 02/22/2015  . Pain in joint, lower leg 10/31/2014  . Ulcer of sacral region, stage 4 ([place]) 04/15/2014  . Moderate aortic stenosis 04/14/2014  . Pre-syncope 04/14/2014  . Hypotension 04/14/2014  . Acute renal failure superimposed on stage 2 chronic kidney disease ([place]) 04/14/2014  . Anemia 04/14/2014  . Protein-calorie malnutrition, severe ([place]) 04/14/2014  . Altered mental status   . Dehydration 04/13/2014  . Altered mental state 04/13/2014  . Acute kidney injury ([place])   . Hypokalemia 03/25/2014  . Syncope 03/16/2014  . Acute encephalopathy 03/16/2014  . Leg swelling 03/02/2014  . Avascular necrosis of bone of right hip ([place]) 09/27/2013  . Primary osteoarthritis of right hip 09/07/2013  . Spinal stenosis, lumbar region, with neurogenic claudication 09/07/2013  . Closed fracture of pubic ramus ([place]) 09/07/2013  . BPPV (benign paroxysmal positional vertigo) 09/07/2013  . Pharyngeal swelling 08/30/2013  . Pelvic fracture ([place]) 08/28/2013  . Fall 08/26/2013  . Dizziness 08/26/2013  . Abnormal stress test 08/26/2013  . Preop cardiovascular exam 08/01/2013  . Aortic stenosis 08/01/2013  . Edema 06/30/2013  . Loss of weight 01/04/2013  . Situational stress 01/04/2013  . Chronic right hip pain 07/05/2012  . Knee pain, acute 06/29/2012  . Vertigo 06/24/2012  . Acute exacerbation of chronic low back pain 06/24/2012  . Lumbar pain with radiation down right leg 06/09/2012  . Osteoarthritis of hip 04/29/2012  .  History of TIAs 04/17/2012  . S/P total hysterectomy and bilateral salpingo-oophorectomy 04/17/2012  . Hard of hearing 04/17/2012  . History of anemia 04/17/2012  . RLS (restless legs syndrome) 04/17/2012  . Chronic back pain 04/17/2012  . Spinal stenosis, lumbar 04/17/2012  . Lower back pain 12/20/2011  . Personal history of colonic polyps 10/08/2011  . H/O 09/14/2011  . Essential hypertension 09/14/2011  . Restless legs syndrome 09/14/2011  . Constipation 08/20/2011  . Urinary incontinence 08/20/2011  CMP     Component Value Date/Time   NA 142 08/17/2016 0343   NA 142 08/17/2016   K 3.9 08/17/2016 0343   CL 106 08/17/2016 0343   CO2 28 08/17/2016 0343   GLUCOSE 92 08/17/2016 0343   BUN 22 (H) 08/17/2016 0343   BUN 22 (A) 08/17/2016   CREATININE 1.13 (H) 08/17/2016 0343   CREATININE 0.78 06/28/2014 1205   CALCIUM 7.8 (L) 08/17/2016 0343   PROT 6.6 08/14/2016 1519   ALBUMIN 3.4 (L) 08/14/2016 1519   AST 28 08/14/2016 1519   ALT 17 08/14/2016 1519   ALKPHOS 107 08/14/2016 1519   BILITOT 1.1 08/14/2016 1519   GFRNONAA 40 (L) 08/17/2016 0343   GFRAA 46 (L) 08/17/2016 0343   Recent Labs    08/15/16 0531 08/16/16 08/16/16 0457 08/17/16 08/17/16 0343  NA 140 147 147* 142 142  K 3.3* 3.1* 3.1*  --  3.9  CL 94*  --  104  --  106  CO2 34*  --  31  --  28  GLUCOSE 86  --  69  --  92  BUN 28* 23* 23* 22* 22*  CREATININE 1.79* 1.2* 1.20* 1.1 1.13*  CALCIUM 8.0*  --  8.0*  --  7.8*  MG  --   --  2.2  --   --    Recent Labs    08/14/16 1519  AST 28  ALT 17  ALKPHOS 107  BILITOT 1.1  PROT 6.6  ALBUMIN 3.4*   Recent Labs    08/14/16 1519  08/15/16 0531  08/16/16 0457  12/22/16 12/23/16 04/20/17  WBC 21.1*   < > 16.1*   < > 11.4*   < > 6.5 6.6 7.9  NEUTROABS 19.6*  --   --   --   --   --   --   --   --   HGB 9.4*   < > 8.5*  --  8.3*   < > 7.5* 7.5* 9.6*  HCT 31.3*   < > 28.2*  --  27.8*   < > 24* 24* 31*  MCV 81.7  --  82.5  --  83.5  --   --   --   --     PLT 300   < > 256  --  261   < > 287 273 295   < > = values in this interval not displayed.   No results for input(s): CHOL, LDLCALC, TRIG in the last 8760 hours.  Invalid input(s): HCL No results found for: [facility] Lab Results  Component Value Date   TSH 2.46 04/26/2016   Lab Results  Component Value Date   HGBA1C 5.6 03/19/2016   Lab Results  Component Value Date   CHOL 141 03/19/2016   HDL 55 03/19/2016   LDLCALC 71 03/19/2016   TRIG 77 03/19/2016   CHOLHDL 2.8 03/06/2014    Significant Diagnostic Results in last 30 days:  No results found.  Assessment and Plan  Chronic diastolic CHF (congestive heart failure) ([facility]) Stable without exacerbation; continue Demadex 60 mg Monday Wednesday Friday and 40 mg daily all other days  Essential hypertension Controlled; continue Demadex 60 mg Monday Wednesday Friday and 40 mg every other day, and Norvasc 5 mg daily  Spinal stenosis, lumbar Chronic back pain; patient is on a pain regimen followed by a pain clinic which includes MS Contin 15 mg twice a day, fentanyl patch 12 g every 72 hours which is new, and  oxycodone 5 mg up to 3 times a day for breakthrough pain.     [name], MD

## 2017-06-24 ENCOUNTER — Encounter: Payer: Self-pay | Admitting: Internal Medicine

## 2017-06-24 NOTE — Progress Notes (Signed)
Location:  [facility] Room Number: 303-D Place of Service:  SNF (31)  Shelly Duos, MD  Patient Care Team: Shelly Duos, MD as PCP - General (Internal Medicine) Shelly Arabian, MD as Consulting Physician (Orthopedic Surgery) Shelly Nunnery, MD as Consulting Physician (Otolaryngology) Shelly Martin, Shelly X, NP as Nurse Practitioner (Internal Medicine)  Extended Emergency Contact Information Primary Emergency Contact: Shelly Martin Address: [place], [place] [address] Shelly Martin of [place] Phone: [phone] Relation: Daughter Secondary Emergency Contact: Shelly Martin, Shelly Martin of [place] Mobile Phone: [phone] Relation: Son    Allergies: Amoxicillin; Baclofen; Hctz [hydrochlorothiazide]; and Valium [diazepam]  Chief Complaint  Patient presents with  . Acute Visit    N/V    HPI: Patient is 84 y.o. female who   Past Medical History:  Diagnosis Date  . Abnormal stress test 08/26/2013  . Acute encephalopathy   . AKI (acute kidney injury) ([place])   . Altered mental state   . Anemia   . Anxiety disorder, unspecified   . Aortic stenosis   . Arthritis    Back, knees  . Avascular necrosis of bone of right hip ([place]) 09/27/2013  . Bowel incontinence   . BPPV (benign paroxysmal positional vertigo) 09/07/2013  . Closed fracture of pubic ramus ([place]) 09/07/2013  . Colon polyps   . Constipation   . Decreased hearing of both ears 08/19/2016   Last Assessment & Plan:  Concern over hearing loss. Slowly progressive hearing loss.  No history of ear surgery, trauma or infection. EXAMINATION shows normal external canals and tympanic membranes. AUDIOGRAM severe sensorineural hearing loss.  Symmetric in nature. PLAN: Reassured I do not see anything bad going on.  She is a candidate for hearing aids.   will check her benefits.  . Gastric ulcer    years ago  . History of anemia 04/17/2012  .  History of TIAs 04/17/2012   June 2011   . Hypertension   . Hypokalemia   . Pelvic fracture ([place]) 08/28/2013  . Personal history of colonic polyps 10/08/2011   Index polypectomy 2013   . Restless legs syndrome    on Requip  . S/P total hysterectomy and bilateral salpingo-oophorectomy 04/17/2012  . Situational stress   . Spinal stenosis   . Spinal stenosis, lumbar 04/17/2012  . Spinal stenosis, lumbar region, with neurogenic claudication 09/07/2013  . Stroke ([place])    Mini, no residual  . TIA (transient ischemic attack)   . Ulcer causing bleeding and hole in wall of stomach or small intestine   . Urinary bladder calculus   . Urinary incontinence   . Vertigo     Past Surgical History:  Procedure Laterality Date  . COLON RESECTION  50 years ago  . LUMBAR LAMINECTOMY/DECOMPRESSION MICRODISCECTOMY  06/25/2011   Procedure: LUMBAR LAMINECTOMY/DECOMPRESSION MICRODISCECTOMY;  Surgeon: Hosie Spangle, MD;  Location: [facility];  Service: Neurosurgery;  Laterality: Right;  RIGHT Lumbar Two-Three hemilaminectomy and microdiskectomy  . TONSILLECTOMY    . TOTAL ABDOMINAL HYSTERECTOMY W/ BILATERAL SALPINGOOPHORECTOMY  04/17/2012  . UTERINE FIBROID SURGERY      Allergies as of 06/24/2017      Reactions   Amoxicillin Other (See Comments)   Reaction unknown   Baclofen Other (See Comments)   "fuzzy in the eyes" and dizzy   Hctz [hydrochlorothiazide]    nausea  Valium [diazepam] Other (See Comments)   Pt became unresponsive and O2 sats dropped.      Medication List        Accurate as of 06/24/17 10:54 AM. Always use your most recent med list.          acetaminophen 325 MG tablet Commonly known as:  TYLENOL Take 325 mg by mouth every 6 (six) hours as needed. Take 2 tablets every 6 hours as needed. Notify doctor if not relieve. Not ex exceed 3000 mg in 24 hour period.   amLODipine 5 MG tablet Commonly known as:  NORVASC Take 5 mg by mouth daily.   feeding supplement (PRO-STAT  SUGAR FREE 64) Liqd Take 30 mLs by mouth. Two times daily between meals to support weight status   fentaNYL 12 MCG/HR Commonly known as:  DURAGESIC - dosed mcg/hr Place 12.5 mcg onto the skin. Apply 1 patch topically to skin every 3 days for pain   ferrous sulfate 325 (65 FE) MG tablet Take 325 mg by mouth. Take one tablet twice daily   lubiprostone 24 MCG capsule Commonly known as:  AMITIZA Take 24 mcg by mouth 2 (two) times daily with a meal.   mirtazapine 15 MG tablet Commonly known as:  REMERON Take 15 mg by mouth. Take 1 tablet at bedtime   morphine 15 MG 12 hr tablet Commonly known as:  MS CONTIN Take 1 tablet (15 mg total) by mouth 2 (two) times daily.   multivitamin tablet Take 1 tablet by mouth daily.   MYRBETRIQ 50 MG Tb24 tablet Generic drug:  mirabegron ER Take 50 mg by mouth. Take one tablet daily for bladder   ondansetron 4 MG tablet Commonly known as:  ZOFRAN Take 4 mg by mouth every 12 (twelve) hours as needed for nausea or vomiting.   oxyCODONE 5 MG immediate release tablet Commonly known as:  Oxy IR/ROXICODONE Take 5 mg by mouth. Take one tablet three times a day as needed for breakthrough pain   pantoprazole 20 MG tablet Commonly known as:  PROTONIX Take 20 mg by mouth daily.   potassium chloride 10 MEQ tablet Commonly known as:  K-DUR Take 10 mEq by mouth daily. 2 tablets   rOPINIRole 4 MG tablet Commonly known as:  REQUIP Take 4 mg by mouth at bedtime.   SYSTANE BALANCE 0.6 % Soln Generic drug:  Propylene Glycol Place 1 drop into both eyes 2 (two) times daily.   torsemide 20 MG tablet Commonly known as:  DEMADEX Take 60 mg by mouth every Monday, Wednesday, and Friday. 40 mg on Sun, Tues, Thurs, Sat       No orders of the defined types were placed in this encounter.   Immunization History  Administered Date(s) Administered  . Influenza Split 04/15/2012  . Influenza,inj,Quad PF,6+ Mos 03/06/2014  . Influenza-Unspecified 03/01/2016,  05/01/2017  . PPD Test 08/30/2014, 09/18/2014  . Pneumococcal Polysaccharide-23 08/29/2013    Social History   Tobacco Use  . Smoking status: Never Smoker  . Smokeless tobacco: Never Used  Substance Use Topics  . Alcohol use: Yes    Alcohol/week: 0.0 oz    Comment: rarely    Review of Systems  DATA OBTAINED: from patient, nurse, medical record, family member GENERAL:  no fevers, fatigue, appetite changes SKIN: No itching, rash HEENT: No complaint RESPIRATORY: No cough, wheezing, SOB CARDIAC: No chest pain, palpitations, lower extremity edema  GI: No abdominal pain, No N/V/D or constipation, No heartburn or reflux  GU: No  dysuria, frequency or urgency, or incontinence  MUSCULOSKELETAL: No unrelieved bone/joint pain NEUROLOGIC: No headache, dizziness  PSYCHIATRIC: No overt anxiety or sadness  Vitals:   06/24/17 1047  BP: 102/60  Pulse: 91  Resp: 18  Temp: (!) 97.1 F (36.2 C)  SpO2: 94%   Body mass index is 18.28 kg/m. Physical Exam  GENERAL APPEARANCE: Alert, conversant, No acute distress  SKIN: No diaphoresis rash HEENT: Unremarkable RESPIRATORY: Breathing is even, unlabored. Lung sounds are clear   CARDIOVASCULAR: Heart RRR no murmurs, rubs or gallops. No peripheral edema  GASTROINTESTINAL: Abdomen is soft, non-tender, not distended w/ normal bowel sounds.  GENITOURINARY: Bladder non tender, not distended  MUSCULOSKELETAL: No abnormal joints or musculature NEUROLOGIC: Cranial nerves 2-12 grossly intact. Moves all extremities PSYCHIATRIC: Mood and affect appropriate to situation, no behavioral issues  Patient Active Problem List   Diagnosis Date Noted  . Iron deficiency anemia 03/29/2017  . GERD (gastroesophageal reflux disease) 12/22/2016  . Decreased hearing of both ears 08/19/2016  . Pressure injury of skin 08/15/2016  . Aspiration pneumonia ([place]) 08/14/2016  . Chronic diastolic CHF (congestive heart failure) ([place]) 08/14/2016  . Chronic right shoulder  pain 02/09/2016  . Bilateral lower extremity edema 02/02/2016  . Ileitis 01/19/2016  . Pressure ulcer 01/09/2016  . SBO (small bowel obstruction) ([place]) 01/08/2016  . Narcotic addiction ([place]) 05/15/2015  . Diarrhea 03/30/2015  . Acute pharyngitis 02/26/2015  . Osteoarthritis of right knee 02/22/2015  . Pain in joint, lower leg 10/31/2014  . Ulcer of sacral region, stage 4 ([place]) 04/15/2014  . Moderate aortic stenosis 04/14/2014  . Pre-syncope 04/14/2014  . Hypotension 04/14/2014  . Acute renal failure superimposed on stage 2 chronic kidney disease ([place]) 04/14/2014  . Anemia 04/14/2014  . Protein-calorie malnutrition, severe ([place]) 04/14/2014  . Altered mental status   . Dehydration 04/13/2014  . Altered mental state 04/13/2014  . Acute kidney injury ([place])   . Hypokalemia 03/25/2014  . Syncope 03/16/2014  . Acute encephalopathy 03/16/2014  . Leg swelling 03/02/2014  . Avascular necrosis of bone of right hip ([place]) 09/27/2013  . Primary osteoarthritis of right hip 09/07/2013  . Spinal stenosis, lumbar region, with neurogenic claudication 09/07/2013  . Closed fracture of pubic ramus ([place]) 09/07/2013  . BPPV (benign paroxysmal positional vertigo) 09/07/2013  . Pharyngeal swelling 08/30/2013  . Pelvic fracture ([place]) 08/28/2013  . Fall 08/26/2013  . Dizziness 08/26/2013  . Abnormal stress test 08/26/2013  . Preop cardiovascular exam 08/01/2013  . Aortic stenosis 08/01/2013  . Edema 06/30/2013  . Loss of weight 01/04/2013  . Situational stress 01/04/2013  . Chronic right hip pain 07/05/2012  . Knee pain, acute 06/29/2012  . Vertigo 06/24/2012  . Acute exacerbation of chronic low back pain 06/24/2012  . Lumbar pain with radiation down right leg 06/09/2012  . Osteoarthritis of hip 04/29/2012  . History of TIAs 04/17/2012  . S/P total hysterectomy and bilateral salpingo-oophorectomy 04/17/2012  . Hard of hearing 04/17/2012  . History of anemia 04/17/2012  . RLS (restless legs  syndrome) 04/17/2012  . Chronic back pain 04/17/2012  . Spinal stenosis, lumbar 04/17/2012  . Lower back pain 12/20/2011  . Personal history of colonic polyps 10/08/2011  . H/O 09/14/2011  . Essential hypertension 09/14/2011  . Restless legs syndrome 09/14/2011  . Constipation 08/20/2011  . Urinary incontinence 08/20/2011    CMP     Component Value Date/Time   NA 142 08/17/2016 0343   NA 142 08/17/2016   K 3.9 08/17/2016 0343  CL 106 08/17/2016 0343   CO2 28 08/17/2016 0343   GLUCOSE 92 08/17/2016 0343   BUN 22 (H) 08/17/2016 0343   BUN 22 (A) 08/17/2016   CREATININE 1.13 (H) 08/17/2016 0343   CREATININE 0.78 06/28/2014 1205   CALCIUM 7.8 (L) 08/17/2016 0343   PROT 6.6 08/14/2016 1519   ALBUMIN 3.4 (L) 08/14/2016 1519   AST 28 08/14/2016 1519   ALT 17 08/14/2016 1519   ALKPHOS 107 08/14/2016 1519   BILITOT 1.1 08/14/2016 1519   GFRNONAA 40 (L) 08/17/2016 0343   GFRAA 46 (L) 08/17/2016 0343   Recent Labs    08/15/16 0531 08/16/16 08/16/16 0457 08/17/16 08/17/16 0343  NA 140 147 147* 142 142  K 3.3* 3.1* 3.1*  --  3.9  CL 94*  --  104  --  106  CO2 34*  --  31  --  28  GLUCOSE 86  --  69  --  92  BUN 28* 23* 23* 22* 22*  CREATININE 1.79* 1.2* 1.20* 1.1 1.13*  CALCIUM 8.0*  --  8.0*  --  7.8*  MG  --   --  2.2  --   --    Recent Labs    08/14/16 1519  AST 28  ALT 17  ALKPHOS 107  BILITOT 1.1  PROT 6.6  ALBUMIN 3.4*   Recent Labs    08/14/16 1519  08/15/16 0531  08/16/16 0457  12/22/16 12/23/16 04/20/17  WBC 21.1*   < > 16.1*   < > 11.4*   < > 6.5 6.6 7.9  NEUTROABS 19.6*  --   --   --   --   --   --   --   --   HGB 9.4*   < > 8.5*  --  8.3*   < > 7.5* 7.5* 9.6*  HCT 31.3*   < > 28.2*  --  27.8*   < > 24* 24* 31*  MCV 81.7  --  82.5  --  83.5  --   --   --   --   PLT 300   < > 256  --  261   < > 287 273 295   < > = values in this interval not displayed.   No results for input(s): CHOL, LDLCALC, TRIG in the last 8760 hours.  Invalid input(s):  HCL No results found for: [facility] Lab Results  Component Value Date   TSH 2.46 04/26/2016   Lab Results  Component Value Date   HGBA1C 5.6 03/19/2016   Lab Results  Component Value Date   CHOL 141 03/19/2016   HDL 55 03/19/2016   LDLCALC 71 03/19/2016   TRIG 77 03/19/2016   CHOLHDL 2.8 03/06/2014    Significant Diagnostic Results in last 30 days:  No results found.  Assessment and Plan  No problem-specific Assessment & Plan notes found for this encounter.   Labs/tests ordered:    Shelly Delaine. [name], MD

## 2017-06-26 ENCOUNTER — Encounter: Payer: Self-pay | Admitting: Internal Medicine

## 2017-06-26 NOTE — Assessment & Plan Note (Signed)
Chronic back pain; patient is on a pain regimen followed by a pain clinic which includes MS Contin 15 mg twice a day, fentanyl patch 12 g every 72 hours which is new, and oxycodone 5 mg up to 3 times a day for breakthrough pain.

## 2017-06-26 NOTE — Assessment & Plan Note (Signed)
Stable without exacerbation; continue Demadex 60 mg Monday Wednesday Friday and 40 mg daily all other days

## 2017-06-26 NOTE — Assessment & Plan Note (Signed)
Controlled; continue Demadex 60 mg Monday Wednesday Friday and 40 mg every other day, and Norvasc 5 mg daily

## 2017-06-28 NOTE — Progress Notes (Signed)
This encounter was created in error - please disregard.

## 2017-07-20 ENCOUNTER — Non-Acute Institutional Stay (SKILLED_NURSING_FACILITY): Payer: Medicare Other | Admitting: Internal Medicine

## 2017-07-20 ENCOUNTER — Encounter: Payer: Self-pay | Admitting: Internal Medicine

## 2017-07-20 DIAGNOSIS — K2100 Gastro-esophageal reflux disease with esophagitis, without bleeding: Secondary | ICD-10-CM

## 2017-07-20 DIAGNOSIS — F411 Generalized anxiety disorder: Secondary | ICD-10-CM | POA: Diagnosis not present

## 2017-07-20 DIAGNOSIS — K5901 Slow transit constipation: Secondary | ICD-10-CM

## 2017-07-20 NOTE — Progress Notes (Signed)
Location:  [facility] Room Number: 154M Place of Service:  SNF (31)  [name], MD  Patient Care Team: [name], MD as PCP - General (Internal Medicine) [name], MD as Consulting Physician (Orthopedic Surgery) [name], MD as Consulting Physician (Otolaryngology) Mast, Man X, NP as Nurse Practitioner (Internal Medicine)  Extended Emergency Contact Information Primary Emergency Contact: [name] Address: [place], [place] [address] [name] of [place] Phone: [phone] Relation: Daughter Secondary Emergency Contact: [place], [place] of [place] Mobile Phone: [phone] Relation: Son    Allergies: Amoxicillin; Baclofen; Hctz [hydrochlorothiazide]; and Valium [diazepam]  Chief Complaint  Patient presents with  . Medical Management of Chronic Issues    Routine Visit    HPI: Patient is 84 y.o. female who is being seen for routine issues of GERD, constipation, and anxiety.   Past Medical History:  Diagnosis Date  . Abnormal stress test 08/26/2013  . Acute encephalopathy   . AKI (acute kidney injury) ([place])   . Altered mental state   . Anemia   . Anxiety disorder, unspecified   . Aortic stenosis   . Arthritis    Back, knees  . Avascular necrosis of bone of right hip ([place]) 09/27/2013  . Bowel incontinence   . BPPV (benign paroxysmal positional vertigo) 09/07/2013  . Closed fracture of pubic ramus ([place]) 09/07/2013  . Colon polyps   . Constipation   . Decreased hearing of both ears 08/19/2016   Last Assessment & Plan:  Concern over hearing loss. Slowly progressive hearing loss.  No history of ear surgery, trauma or infection. EXAMINATION shows normal external canals and tympanic membranes. AUDIOGRAM severe sensorineural hearing loss.  Symmetric in nature. PLAN: Reassured I do not see anything bad going on.  She is a candidate for hearing  aids.   will check her benefits.  . Gastric ulcer    years ago  . History of anemia 04/17/2012  . History of TIAs 04/17/2012   June 2011   . Hypertension   . Hypokalemia   . Pelvic fracture ([facility]) 08/28/2013  . Personal history of colonic polyps 10/08/2011   Index polypectomy 2013   . Restless legs syndrome    on Requip  . S/P total hysterectomy and bilateral salpingo-oophorectomy 04/17/2012  . Situational stress   . Spinal stenosis   . Spinal stenosis, lumbar 04/17/2012  . Spinal stenosis, lumbar region, with neurogenic claudication 09/07/2013  . Stroke ([place])    Mini, no residual  . TIA (transient ischemic attack)   . Ulcer causing bleeding and hole in wall of stomach or small intestine   . Urinary bladder calculus   . Urinary incontinence   . Vertigo     Past Surgical History:  Procedure Laterality Date  . COLON RESECTION  50 years ago  . LUMBAR LAMINECTOMY/DECOMPRESSION MICRODISCECTOMY  06/25/2011   Procedure: LUMBAR LAMINECTOMY/DECOMPRESSION MICRODISCECTOMY;  Surgeon: Hosie Spangle, MD;  Location: [facility];  Service: Neurosurgery;  Laterality: Right;  RIGHT Lumbar Two-Three hemilaminectomy and microdiskectomy  . TONSILLECTOMY    . TOTAL ABDOMINAL HYSTERECTOMY W/ BILATERAL SALPINGOOPHORECTOMY  04/17/2012  . UTERINE FIBROID SURGERY      Allergies as of 07/20/2017      Reactions   Amoxicillin Other (See Comments)   Reaction unknown   Baclofen Other (See Comments)   "fuzzy  in the eyes" and dizzy   Hctz [hydrochlorothiazide]    nausea   Valium [diazepam] Other (See Comments)   Pt became unresponsive and O2 sats dropped.      Medication List        Accurate as of 07/20/17 11:59 PM. Always use your most recent med list.          acetaminophen 325 MG tablet Commonly known as:  TYLENOL Take 325 mg by mouth every 6 (six) hours as needed. Take 2 tablets every 6 hours as needed. Notify doctor if not relieve. Not ex exceed 3000 mg in 24 hour period.   amLODipine 5  MG tablet Commonly known as:  NORVASC Take 5 mg by mouth daily.   feeding supplement (PRO-STAT SUGAR FREE 64) Liqd Take 30 mLs by mouth 2 (two) times daily between meals. support weight status   fentaNYL 12 MCG/HR Commonly known as:  DURAGESIC - dosed mcg/hr Place 12.5 mcg onto the skin every 3 (three) days. pain   ferrous sulfate 325 (65 FE) MG tablet Take 325 mg by mouth 2 (two) times daily.   lubiprostone 24 MCG capsule Commonly known as:  AMITIZA Take 24 mcg by mouth 2 (two) times daily with a meal.   mirtazapine 15 MG tablet Commonly known as:  REMERON Take 15 mg by mouth at bedtime.   morphine 15 MG 12 hr tablet Commonly known as:  MS CONTIN Take 1 tablet (15 mg total) by mouth 2 (two) times daily.   multivitamin tablet Take 1 tablet by mouth daily.   MYRBETRIQ 50 MG Tb24 tablet Generic drug:  mirabegron ER Take 50 mg by mouth daily. bladder   ondansetron 4 MG tablet Commonly known as:  ZOFRAN Take 4 mg by mouth every 12 (twelve) hours as needed for nausea or vomiting.   oxyCODONE 5 MG immediate release tablet Commonly known as:  Oxy IR/ROXICODONE Take 5 mg by mouth 3 (three) times daily as needed for breakthrough pain.   pantoprazole 20 MG tablet Commonly known as:  PROTONIX Take 20 mg by mouth daily.   potassium chloride 10 MEQ tablet Commonly known as:  K-DUR Take 20 mEq by mouth daily. 2 tablets   rOPINIRole 4 MG tablet Commonly known as:  REQUIP Take 4 mg by mouth at bedtime.   SYSTANE BALANCE 0.6 % Soln Generic drug:  Propylene Glycol Place 1 drop into both eyes 2 (two) times daily.   torsemide 20 MG tablet Commonly known as:  DEMADEX Take 40 mg by mouth every Tuesday, Thursday, Saturday, and Sunday. 40 mg on Sun, Tues, Thurs, Sat   torsemide 20 MG tablet Commonly known as:  DEMADEX Take 60 mg by mouth every Monday, Wednesday, and Friday. 3 tabs       No orders of the defined types were placed in this encounter.   Immunization History   Administered Date(s) Administered  . Influenza Split 04/15/2012  . Influenza,inj,Quad PF,6+ Mos 03/06/2014  . Influenza-Unspecified 03/01/2016, 05/01/2017  . PPD Test 08/30/2014, 09/18/2014  . Pneumococcal Polysaccharide-23 08/29/2013    Social History   Tobacco Use  . Smoking status: Never Smoker  . Smokeless tobacco: Never Used  Substance Use Topics  . Alcohol use: Yes    Alcohol/week: 0.0 oz    Comment: rarely    Review of Systems  DATA OBTAINED: from patient, nurse GENERAL:  no fevers, fatigue, appetite changes SKIN: No itching, rash HEENT: No complaint RESPIRATORY: No cough, wheezing, SOB CARDIAC: No chest pain, palpitations, lower  extremity edema  GI: No abdominal pain, No N/V/D or constipation, No heartburn or reflux  GU: No dysuria, frequency or urgency, or incontinence  MUSCULOSKELETAL: No unrelieved bone/joint pain NEUROLOGIC: No headache, dizziness  PSYCHIATRIC: No overt anxiety or sadness  Vitals:   07/20/17 1340  BP: 106/60  Pulse: 81  Resp: 18  Temp: (!) 97.4 F (36.3 C)  SpO2: 92%   Body mass index is 17.93 kg/m. Physical Exam  GENERAL APPEARANCE: Alert, conversant, No acute distress  SKIN: No diaphoresis rash HEENT: Unremarkable RESPIRATORY: Breathing is even, unlabored. Lung sounds are clear   CARDIOVASCULAR: Heart RRR no murmurs, rubs or gallops. No peripheral edema  GASTROINTESTINAL: Abdomen is soft, non-tender, not distended w/ normal bowel sounds.  GENITOURINARY: Bladder non tender, not distended  MUSCULOSKELETAL: No abnormal joints or musculature NEUROLOGIC: Cranial nerves 2-12 grossly intact. Moves all extremities PSYCHIATRIC: Mood and affect appropriate to situation with dementia, no behavioral issues  Patient Active Problem List   Diagnosis Date Noted  . Anxiety disorder, unspecified 07/23/2017  . Iron deficiency anemia 03/29/2017  . GERD (gastroesophageal reflux disease) 12/22/2016  . Decreased hearing of both ears 08/19/2016    . Pressure injury of skin 08/15/2016  . Aspiration pneumonia ([place]) 08/14/2016  . Chronic diastolic CHF (congestive heart failure) ([facility]) 08/14/2016  . Chronic right shoulder pain 02/09/2016  . Bilateral lower extremity edema 02/02/2016  . Ileitis 01/19/2016  . Pressure ulcer 01/09/2016  . SBO (small bowel obstruction) ([facility]) 01/08/2016  . Narcotic addiction ([facility]) 05/15/2015  . Diarrhea 03/30/2015  . Acute pharyngitis 02/26/2015  . Osteoarthritis of right knee 02/22/2015  . Pain in joint, lower leg 10/31/2014  . Ulcer of sacral region, stage 4 ([facility]) 04/15/2014  . Moderate aortic stenosis 04/14/2014  . Pre-syncope 04/14/2014  . Hypotension 04/14/2014  . Acute renal failure superimposed on stage 2 chronic kidney disease ([place]) 04/14/2014  . Anemia 04/14/2014  . Protein-calorie malnutrition, severe ([place]) 04/14/2014  . Altered mental status   . Dehydration 04/13/2014  . Altered mental state 04/13/2014  . Acute kidney injury ([place])   . Hypokalemia 03/25/2014  . Syncope 03/16/2014  . Acute encephalopathy 03/16/2014  . Leg swelling 03/02/2014  . Avascular necrosis of bone of right hip ([place]) 09/27/2013  . Primary osteoarthritis of right hip 09/07/2013  . Spinal stenosis, lumbar region, with neurogenic claudication 09/07/2013  . Closed fracture of pubic ramus ([place]) 09/07/2013  . BPPV (benign paroxysmal positional vertigo) 09/07/2013  . Pharyngeal swelling 08/30/2013  . Pelvic fracture ([place]) 08/28/2013  . Fall 08/26/2013  . Dizziness 08/26/2013  . Abnormal stress test 08/26/2013  . Preop cardiovascular exam 08/01/2013  . Aortic stenosis 08/01/2013  . Edema 06/30/2013  . Loss of weight 01/04/2013  . Situational stress 01/04/2013  . Chronic right hip pain 07/05/2012  . Knee pain, acute 06/29/2012  . Vertigo 06/24/2012  . Acute exacerbation of chronic low back pain 06/24/2012  . Lumbar pain with radiation down right leg 06/09/2012  . Osteoarthritis of hip 04/29/2012  . History of  TIAs 04/17/2012  . S/P total hysterectomy and bilateral salpingo-oophorectomy 04/17/2012  . Hard of hearing 04/17/2012  . History of anemia 04/17/2012  . RLS (restless legs syndrome) 04/17/2012  . Chronic back pain 04/17/2012  . Spinal stenosis, lumbar 04/17/2012  . Lower back pain 12/20/2011  . Personal history of colonic polyps 10/08/2011  . H/O 09/14/2011  . Essential hypertension 09/14/2011  . Restless legs syndrome 09/14/2011  . Constipation 08/20/2011  . Urinary incontinence 08/20/2011  CMP     Component Value Date/Time   NA 142 08/17/2016 0343   NA 142 08/17/2016   K 3.9 08/17/2016 0343   CL 106 08/17/2016 0343   CO2 28 08/17/2016 0343   GLUCOSE 92 08/17/2016 0343   BUN 22 (H) 08/17/2016 0343   BUN 22 (A) 08/17/2016   CREATININE 1.13 (H) 08/17/2016 0343   CREATININE 0.78 06/28/2014 1205   CALCIUM 7.8 (L) 08/17/2016 0343   PROT 6.6 08/14/2016 1519   ALBUMIN 3.4 (L) 08/14/2016 1519   AST 28 08/14/2016 1519   ALT 17 08/14/2016 1519   ALKPHOS 107 08/14/2016 1519   BILITOT 1.1 08/14/2016 1519   GFRNONAA 40 (L) 08/17/2016 0343   GFRAA 46 (L) 08/17/2016 0343   Recent Labs    08/15/16 0531 08/16/16 08/16/16 0457 08/17/16 08/17/16 0343  NA 140 147 147* 142 142  K 3.3* 3.1* 3.1*  --  3.9  CL 94*  --  104  --  106  CO2 34*  --  31  --  28  GLUCOSE 86  --  69  --  92  BUN 28* 23* 23* 22* 22*  CREATININE 1.79* 1.2* 1.20* 1.1 1.13*  CALCIUM 8.0*  --  8.0*  --  7.8*  MG  --   --  2.2  --   --    Recent Labs    08/14/16 1519  AST 28  ALT 17  ALKPHOS 107  BILITOT 1.1  PROT 6.6  ALBUMIN 3.4*   Recent Labs    08/14/16 1519  08/15/16 0531  08/16/16 0457  12/22/16 12/23/16 04/20/17  WBC 21.1*   < > 16.1*   < > 11.4*   < > 6.5 6.6 7.9  NEUTROABS 19.6*  --   --   --   --   --   --   --   --   HGB 9.4*   < > 8.5*  --  8.3*   < > 7.5* 7.5* 9.6*  HCT 31.3*   < > 28.2*  --  27.8*   < > 24* 24* 31*  MCV 81.7  --  82.5  --  83.5  --   --   --   --   PLT 300   < >  256  --  261   < > 287 273 295   < > = values in this interval not displayed.   No results for input(s): CHOL, LDLCALC, TRIG in the last 8760 hours.  Invalid input(s): HCL No results found for: [facility] Lab Results  Component Value Date   TSH 2.46 04/26/2016   Lab Results  Component Value Date   HGBA1C 5.6 03/19/2016   Lab Results  Component Value Date   CHOL 141 03/19/2016   HDL 55 03/19/2016   LDLCALC 71 03/19/2016   TRIG 77 03/19/2016   CHOLHDL 2.8 03/06/2014    Significant Diagnostic Results in last 30 days:  No results found.  Assessment and Plan  GERD (gastroesophageal reflux disease) No complaints of reflux; continue Protonix 20 mg by mouth daily  Constipation Chronic and stable; continue Amitiza 24 mg by mouth twice a day  Anxiety disorder, unspecified Not requiring meds at this time however it is getting worse as her dementia worsens; will continue to monitor     Demoni Parmar D. [name], MD

## 2017-07-23 ENCOUNTER — Encounter: Payer: Self-pay | Admitting: Internal Medicine

## 2017-07-23 DIAGNOSIS — F419 Anxiety disorder, unspecified: Secondary | ICD-10-CM | POA: Insufficient documentation

## 2017-07-23 NOTE — Assessment & Plan Note (Signed)
No complaints of reflux; continue Protonix 20 mg by mouth daily

## 2017-07-23 NOTE — Assessment & Plan Note (Signed)
Not requiring meds at this time however it is getting worse as her dementia worsens; will continue to monitor

## 2017-07-23 NOTE — Assessment & Plan Note (Signed)
Chronic and stable; continue Amitiza 24 mg by mouth twice a day

## 2017-08-12 ENCOUNTER — Encounter: Payer: Self-pay | Admitting: Internal Medicine

## 2017-08-12 ENCOUNTER — Non-Acute Institutional Stay (SKILLED_NURSING_FACILITY): Payer: Medicare Other | Admitting: Internal Medicine

## 2017-08-12 DIAGNOSIS — D500 Iron deficiency anemia secondary to blood loss (chronic): Secondary | ICD-10-CM | POA: Diagnosis not present

## 2017-08-12 DIAGNOSIS — G8929 Other chronic pain: Secondary | ICD-10-CM | POA: Diagnosis not present

## 2017-08-12 DIAGNOSIS — M87051 Idiopathic aseptic necrosis of right femur: Secondary | ICD-10-CM | POA: Diagnosis not present

## 2017-08-12 DIAGNOSIS — M545 Low back pain: Secondary | ICD-10-CM

## 2017-08-12 NOTE — Progress Notes (Signed)
Location:  [facility] Room Number: 458K Place of Service:  SNF (31)  [name], MD  Patient Care Team: [name], MD as PCP - General (Internal Medicine) [name], MD as Consulting Physician (Orthopedic Surgery) [name], MD as Consulting Physician (Otolaryngology) Mast, Man X, NP as Nurse Practitioner (Internal Medicine)  Extended Emergency Contact Information Primary Emergency Contact: [name] Address: [place], [place] [address] [name] of [place] Phone: [phone] Relation: Daughter Secondary Emergency Contact: [place], [place] of [place] Mobile Phone: [phone] Relation: Son    Allergies: Amoxicillin; Baclofen; Hctz [hydrochlorothiazide]; and Valium [diazepam]  Chief Complaint  Patient presents with  . Medical Management of Chronic Issues    HPI: Patient is 84 y.o. female who is being seen for routine issues of anemia, avascular necrosis of right hip and chronic low back pain.  Past Medical History:  Diagnosis Date  . Abnormal stress test 08/26/2013  . Acute encephalopathy   . AKI (acute kidney injury) ([place])   . Altered mental state   . Anemia   . Anxiety disorder, unspecified   . Aortic stenosis   . Arthritis    Back, knees  . Avascular necrosis of bone of right hip ([place]) 09/27/2013  . Bowel incontinence   . BPPV (benign paroxysmal positional vertigo) 09/07/2013  . Closed fracture of pubic ramus ([place]) 09/07/2013  . Colon polyps   . Constipation   . Decreased hearing of both ears 08/19/2016   Last Assessment & Plan:  Concern over hearing loss. Slowly progressive hearing loss.  No history of ear surgery, trauma or infection. EXAMINATION shows normal external canals and tympanic membranes. AUDIOGRAM severe sensorineural hearing loss.  Symmetric in nature. PLAN: Reassured I do not see anything bad going on.  She is a candidate  for hearing aids.   will check her benefits.  . Gastric ulcer    years ago  . History of anemia 04/17/2012  . History of TIAs 04/17/2012   June 2011   . Hypertension   . Hypokalemia   . Pelvic fracture ([place]) 08/28/2013  . Personal history of colonic polyps 10/08/2011   Index polypectomy 2013   . Restless legs syndrome    on Requip  . S/P total hysterectomy and bilateral salpingo-oophorectomy 04/17/2012  . Situational stress   . Spinal stenosis   . Spinal stenosis, lumbar 04/17/2012  . Spinal stenosis, lumbar region, with neurogenic claudication 09/07/2013  . Stroke ([place])    Mini, no residual  . TIA (transient ischemic attack)   . Ulcer causing bleeding and hole in wall of stomach or small intestine   . Urinary bladder calculus   . Urinary incontinence   . Vertigo     Past Surgical History:  Procedure Laterality Date  . COLON RESECTION  50 years ago  . LUMBAR LAMINECTOMY/DECOMPRESSION MICRODISCECTOMY  06/25/2011   Procedure: LUMBAR LAMINECTOMY/DECOMPRESSION MICRODISCECTOMY;  Surgeon: Hosie Spangle, MD;  Location: [facility];  Service: Neurosurgery;  Laterality: Right;  RIGHT Lumbar Two-Three hemilaminectomy and microdiskectomy  . TONSILLECTOMY    . TOTAL ABDOMINAL HYSTERECTOMY W/ BILATERAL SALPINGOOPHORECTOMY  04/17/2012  . UTERINE FIBROID SURGERY      Allergies as of 08/12/2017      Reactions   Amoxicillin Other (See Comments)   Reaction unknown   Baclofen Other (See Comments)   "  fuzzy in the eyes" and dizzy   Hctz [hydrochlorothiazide]    nausea   Valium [diazepam] Other (See Comments)   Pt became unresponsive and O2 sats dropped.      Medication List        Accurate as of 08/12/17 11:59 PM. Always use your most recent med list.          acetaminophen 325 MG tablet Commonly known as:  TYLENOL Take 325 mg by mouth every 6 (six) hours as needed. Take 2 tablets every 6 hours as needed. Notify doctor if not relieve. Not ex exceed 3000 mg in 24 hour period.     amLODipine 5 MG tablet Commonly known as:  NORVASC Take 5 mg by mouth daily.   feeding supplement (PRO-STAT SUGAR FREE 64) Liqd Take 30 mLs by mouth 2 (two) times daily between meals. support weight status   ferrous sulfate 325 (65 FE) MG tablet Take 325 mg by mouth 2 (two) times daily.   lubiprostone 24 MCG capsule Commonly known as:  AMITIZA Take 24 mcg by mouth 2 (two) times daily with a meal.   mirtazapine 15 MG tablet Commonly known as:  REMERON Take 15 mg by mouth at bedtime.   morphine 15 MG 12 hr tablet Commonly known as:  MS CONTIN Take 1 tablet (15 mg total) by mouth 2 (two) times daily.   multivitamin tablet Take 1 tablet by mouth daily.   MYRBETRIQ 50 MG Tb24 tablet Generic drug:  mirabegron ER Take 50 mg by mouth daily. bladder   ondansetron 4 MG tablet Commonly known as:  ZOFRAN Take 4 mg by mouth every 12 (twelve) hours as needed for nausea or vomiting.   oxyCODONE 5 MG immediate release tablet Commonly known as:  Oxy IR/ROXICODONE Take 5 mg by mouth 3 (three) times daily as needed for breakthrough pain.   pantoprazole 20 MG tablet Commonly known as:  PROTONIX Take 20 mg by mouth daily.   potassium chloride 10 MEQ tablet Commonly known as:  K-DUR Take 20 mEq by mouth daily. 2 tablets   rOPINIRole 4 MG tablet Commonly known as:  REQUIP Take 4 mg by mouth at bedtime.   SYSTANE BALANCE 0.6 % Soln Generic drug:  Propylene Glycol Place 1 drop into both eyes 2 (two) times daily.   torsemide 20 MG tablet Commonly known as:  DEMADEX Take 40 mg by mouth every Tuesday, Thursday, Saturday, and Sunday. 40 mg on Sun, Tues, Thurs, Sat   torsemide 20 MG tablet Commonly known as:  DEMADEX Take 60 mg by mouth every Monday, Wednesday, and Friday. 3 tabs       No orders of the defined types were placed in this encounter.   Immunization History  Administered Date(s) Administered  . Influenza Split 04/15/2012  . Influenza,inj,Quad PF,6+ Mos 03/06/2014   . Influenza-Unspecified 03/01/2016, 05/01/2017  . PPD Test 08/30/2014, 09/18/2014  . Pneumococcal Polysaccharide-23 08/29/2013    Social History   Tobacco Use  . Smoking status: Never Smoker  . Smokeless tobacco: Never Used  Substance Use Topics  . Alcohol use: Yes    Alcohol/week: 0.0 oz    Comment: rarely    Review of Systems  DATA OBTAINED: from patient,nurse GENERAL:  no fevers, fatigue, appetite changes SKIN: No itching, rash HEENT: No complaint RESPIRATORY: No cough, wheezing, SOB CARDIAC: No chest pain, palpitations, lower extremity edema  GI: No abdominal pain, No N/V/D or constipation, No heartburn or reflux  GU: No dysuria, frequency or urgency, or  incontinence  MUSCULOSKELETAL: + unrelieved bone/joint pain-patient states pain does not look to be in pain; patient does not seek pain medications NEUROLOGIC: No headache, dizziness  PSYCHIATRIC: No overt anxiety or sadness  Vitals:   08/12/17 1223  BP: 108/66  Pulse: 74  Resp: 18  Temp: (!) 97.1 F (36.2 C)   Body mass index is 17.19 kg/m. Physical Exam  GENERAL APPEARANCE: Alert, conversant, No acute distress  SKIN: No diaphoresis rash HEENT: Unremarkable RESPIRATORY: Breathing is even, unlabored. Lung sounds are clear   CARDIOVASCULAR: Heart RRR no murmurs, rubs or gallops. No peripheral edema  GASTROINTESTINAL: Abdomen is soft, non-tender, not distended w/ normal bowel sounds.  GENITOURINARY: Bladder non tender, not distended  MUSCULOSKELETAL: No abnormal joints or musculature NEUROLOGIC: Cranial nerves 2-12 grossly intact. Moves all extremities PSYCHIATRIC: Mood and affect appropriate to situation with dementia, no behavioral issues  Patient Active Problem List   Diagnosis Date Noted  . Anxiety disorder, unspecified 07/23/2017  . Iron deficiency anemia 03/29/2017  . GERD (gastroesophageal reflux disease) 12/22/2016  . Decreased hearing of both ears 08/19/2016  . Pressure injury of skin 08/15/2016   . Aspiration pneumonia ([place]) 08/14/2016  . Chronic diastolic CHF (congestive heart failure) ([place]) 08/14/2016  . Chronic right shoulder pain 02/09/2016  . Bilateral lower extremity edema 02/02/2016  . Ileitis 01/19/2016  . Pressure ulcer 01/09/2016  . SBO (small bowel obstruction) ([place]) 01/08/2016  . Narcotic addiction ([place]) 05/15/2015  . Diarrhea 03/30/2015  . Acute pharyngitis 02/26/2015  . Osteoarthritis of right knee 02/22/2015  . Pain in joint, lower leg 10/31/2014  . Ulcer of sacral region, stage 4 ([place]) 04/15/2014  . Moderate aortic stenosis 04/14/2014  . Pre-syncope 04/14/2014  . Hypotension 04/14/2014  . Acute renal failure superimposed on stage 2 chronic kidney disease ([place]) 04/14/2014  . Anemia 04/14/2014  . Protein-calorie malnutrition, severe ([place]) 04/14/2014  . Altered mental status   . Dehydration 04/13/2014  . Altered mental state 04/13/2014  . Acute kidney injury ([place])   . Hypokalemia 03/25/2014  . Syncope 03/16/2014  . Acute encephalopathy 03/16/2014  . Leg swelling 03/02/2014  . Avascular necrosis of bone of right hip ([place]) 09/27/2013  . Primary osteoarthritis of right hip 09/07/2013  . Spinal stenosis, lumbar region, with neurogenic claudication 09/07/2013  . Closed fracture of pubic ramus ([place]) 09/07/2013  . BPPV (benign paroxysmal positional vertigo) 09/07/2013  . Pharyngeal swelling 08/30/2013  . Pelvic fracture ([place]) 08/28/2013  . Fall 08/26/2013  . Dizziness 08/26/2013  . Abnormal stress test 08/26/2013  . Preop cardiovascular exam 08/01/2013  . Aortic stenosis 08/01/2013  . Edema 06/30/2013  . Loss of weight 01/04/2013  . Situational stress 01/04/2013  . Chronic right hip pain 07/05/2012  . Knee pain, acute 06/29/2012  . Vertigo 06/24/2012  . Acute exacerbation of chronic low back pain 06/24/2012  . Lumbar pain with radiation down right leg 06/09/2012  . Osteoarthritis of hip 04/29/2012  . History of TIAs 04/17/2012  . S/P total hysterectomy  and bilateral salpingo-oophorectomy 04/17/2012  . Hard of hearing 04/17/2012  . History of anemia 04/17/2012  . RLS (restless legs syndrome) 04/17/2012  . Chronic back pain 04/17/2012  . Spinal stenosis, lumbar 04/17/2012  . Lower back pain 12/20/2011  . Personal history of colonic polyps 10/08/2011  . H/O 09/14/2011  . Essential hypertension 09/14/2011  . Restless legs syndrome 09/14/2011  . Constipation 08/20/2011  . Urinary incontinence 08/20/2011    CMP     Component Value Date/Time   NA  142 08/17/2016 0343   NA 142 08/17/2016   K 3.9 08/17/2016 0343   CL 106 08/17/2016 0343   CO2 28 08/17/2016 0343   GLUCOSE 92 08/17/2016 0343   BUN 22 (H) 08/17/2016 0343   BUN 22 (A) 08/17/2016   CREATININE 1.13 (H) 08/17/2016 0343   CREATININE 0.78 06/28/2014 1205   CALCIUM 7.8 (L) 08/17/2016 0343   PROT 6.6 08/14/2016 1519   ALBUMIN 3.4 (L) 08/14/2016 1519   AST 28 08/14/2016 1519   ALT 17 08/14/2016 1519   ALKPHOS 107 08/14/2016 1519   BILITOT 1.1 08/14/2016 1519   GFRNONAA 40 (L) 08/17/2016 0343   GFRAA 46 (L) 08/17/2016 0343   Recent Labs    08/17/16 08/17/16 0343  NA 142 142  K  --  3.9  CL  --  106  CO2  --  28  GLUCOSE  --  92  BUN 22* 22*  CREATININE 1.1 1.13*  CALCIUM  --  7.8*   No results for input(s): AST, ALT, ALKPHOS, BILITOT, PROT, ALBUMIN in the last 8760 hours. Recent Labs    12/22/16 12/23/16 04/20/17  WBC 6.5 6.6 7.9  HGB 7.5* 7.5* 9.6*  HCT 24* 24* 31*  PLT 287 273 295   No results for input(s): CHOL, LDLCALC, TRIG in the last 8760 hours.  Invalid input(s): HCL No results found for: [facility] Lab Results  Component Value Date   TSH 2.46 04/26/2016   Lab Results  Component Value Date   HGBA1C 5.6 03/19/2016   Lab Results  Component Value Date   CHOL 141 03/19/2016   HDL 55 03/19/2016   LDLCALC 71 03/19/2016   TRIG 77 03/19/2016   CHOLHDL 2.8 03/06/2014    Significant Diagnostic Results in last 30 days:  No results  found.  Assessment and Plan  Anemia Is most recent hemoglobin is 9.6 which is stable; continue iron 325 mg twice daily  Avascular necrosis of bone of right hip Chronic and sta byble; pt is followed by pain clinic for her chronic pain medicatins  Chronic back pain Chronic and stable; patient is [facility] by [facility] for her chronic pain medications    Kenner Lewan D. [name], MD

## 2017-08-16 ENCOUNTER — Encounter: Payer: Self-pay | Admitting: Internal Medicine

## 2017-08-16 NOTE — Assessment & Plan Note (Signed)
Chronic and stable; patient is followed by [facility] for her chronic pain medications

## 2017-08-16 NOTE — Assessment & Plan Note (Signed)
Chronic and sta byble; pt is followed by pain clinic for her chronic pain medicatins

## 2017-08-16 NOTE — Assessment & Plan Note (Signed)
Is most recent hemoglobin is 9.6 which is stable; continue iron 325 mg twice daily

## 2017-09-10 ENCOUNTER — Other Ambulatory Visit: Payer: Self-pay

## 2017-09-10 MED ORDER — OXYCODONE HCL 5 MG PO TABS: 5.0000 mg | ORAL_TABLET | Freq: Every day | ORAL | 0 refills | Status: DC

## 2017-09-10 NOTE — Telephone Encounter (Signed)
Faxed to [facility] Fax Number: [phone], Phone Number [phone]  No provider is at [facility] today, Dr.Alexander is out. RX printed and signed by office provider

## 2017-09-18 ENCOUNTER — Encounter: Payer: Self-pay | Admitting: Internal Medicine

## 2017-09-18 ENCOUNTER — Non-Acute Institutional Stay (SKILLED_NURSING_FACILITY): Payer: Medicare Other | Admitting: Internal Medicine

## 2017-09-18 DIAGNOSIS — F1120 Opioid dependence, uncomplicated: Secondary | ICD-10-CM | POA: Diagnosis not present

## 2017-09-18 DIAGNOSIS — G2581 Restless legs syndrome: Secondary | ICD-10-CM | POA: Diagnosis not present

## 2017-09-18 DIAGNOSIS — M48062 Spinal stenosis, lumbar region with neurogenic claudication: Secondary | ICD-10-CM

## 2017-09-18 NOTE — Progress Notes (Signed)
Location:  [facility] Room Number: 303-D Place of Service:  SNF (31)  [name], MD  Patient Care Team: [name], MD as PCP - General (Internal Medicine) [name], MD as Consulting Physician (Orthopedic Surgery) [name], MD as Consulting Physician (Otolaryngology) Mast, Man X, NP as Nurse Practitioner (Internal Medicine)  Extended Emergency Contact Information Primary Emergency Contact: [name] Address: [place], [place] [address] [name] of [place] Phone: [phone] Relation: Daughter Secondary Emergency Contact: Green, [place] of [place] Mobile Phone: [phone] Relation: Son    Allergies: Amoxicillin; Baclofen; Hctz [hydrochlorothiazide]; and Valium [diazepam]  Chief Complaint  Patient presents with  . Medical Management of Chronic Issues    HPI: Patient is 84 y.o. female who is being seen for routine issues of spinal stenosis, restless leg syndrome, and narcotic addiction.  Past Medical History:  Diagnosis Date  . Abnormal stress test 08/26/2013  . Acute encephalopathy   . AKI (acute kidney injury) ([place])   . Altered mental state   . Anemia   . Anxiety disorder, unspecified   . Aortic stenosis   . Arthritis    Back, knees  . Avascular necrosis of bone of right hip ([place]) 09/27/2013  . Bowel incontinence   . BPPV (benign paroxysmal positional vertigo) 09/07/2013  . Closed fracture of pubic ramus ([place]) 09/07/2013  . Colon polyps   . Constipation   . Decreased hearing of both ears 08/19/2016   Last Assessment & Plan:  Concern over hearing loss. Slowly progressive hearing loss.  No history of ear surgery, trauma or infection. EXAMINATION shows normal external canals and tympanic membranes. AUDIOGRAM severe sensorineural hearing loss.  Symmetric in nature. PLAN: Reassured I do not see anything bad going on.  She is a candidate  for hearing aids.   will check her benefits.  . Gastric ulcer    years ago  . History of anemia 04/17/2012  . History of TIAs 04/17/2012   June 2011   . Hypertension   . Hypokalemia   . Pelvic fracture ([place]) 08/28/2013  . Personal history of colonic polyps 10/08/2011   Index polypectomy 2013   . Restless legs syndrome    on Requip  . S/P total hysterectomy and bilateral salpingo-oophorectomy 04/17/2012  . Situational stress   . Spinal stenosis   . Spinal stenosis, lumbar 04/17/2012  . Spinal stenosis, lumbar region, with neurogenic claudication 09/07/2013  . Stroke ([place])    Mini, no residual  . TIA (transient ischemic attack)   . Ulcer causing bleeding and hole in wall of stomach or small intestine   . Urinary bladder calculus   . Urinary incontinence   . Vertigo     Past Surgical History:  Procedure Laterality Date  . COLON RESECTION  50 years ago  . LUMBAR LAMINECTOMY/DECOMPRESSION MICRODISCECTOMY  06/25/2011   Procedure: LUMBAR LAMINECTOMY/DECOMPRESSION MICRODISCECTOMY;  Surgeon: Hosie Spangle, MD;  Location: [facility];  Service: Neurosurgery;  Laterality: Right;  RIGHT Lumbar Two-Three hemilaminectomy and microdiskectomy  . TONSILLECTOMY    . TOTAL ABDOMINAL HYSTERECTOMY W/ BILATERAL SALPINGOOPHORECTOMY  04/17/2012  . UTERINE FIBROID SURGERY      Allergies as of 09/18/2017      Reactions   Amoxicillin Other (See Comments)   Reaction unknown   Baclofen Other (See Comments)   "fuzzy in the  eyes" and dizzy   Hctz [hydrochlorothiazide]    nausea   Valium [diazepam] Other (See Comments)   Pt became unresponsive and O2 sats dropped.      Medication List        Accurate as of 09/18/17 11:59 PM. Always use your most recent med list.          acetaminophen 325 MG tablet Commonly known as:  TYLENOL Take 325 mg by mouth every 6 (six) hours as needed. Take 2 tablets every 6 hours as needed. Notify doctor if not relieve. Not ex exceed 3000 mg in 24 hour period.     feeding supplement (PRO-STAT SUGAR FREE 64) Liqd Take 30 mLs by mouth 2 (two) times daily between meals. support weight status   ferrous sulfate 325 (65 FE) MG tablet Take 325 mg by mouth 2 (two) times daily.   lubiprostone 24 MCG capsule Commonly known as:  AMITIZA Take 24 mcg by mouth 2 (two) times daily with a meal.   mirtazapine 15 MG tablet Commonly known as:  REMERON Take 15 mg by mouth at bedtime.   morphine 15 MG 12 hr tablet Commonly known as:  MS CONTIN Take 1 tablet (15 mg total) by mouth 2 (two) times daily.   multivitamin tablet Take 1 tablet by mouth daily.   MYRBETRIQ 50 MG Tb24 tablet Generic drug:  mirabegron ER Take 50 mg by mouth daily. bladder   ondansetron 4 MG tablet Commonly known as:  ZOFRAN Take 4 mg by mouth every 12 (twelve) hours as needed for nausea or vomiting.   oxyCODONE 5 MG immediate release tablet Commonly known as:  Oxy IR/ROXICODONE Take 1 tablet (5 mg total) by mouth daily. At 12 pm   pantoprazole 20 MG tablet Commonly known as:  PROTONIX Take 20 mg by mouth daily.   potassium chloride 10 MEQ tablet Commonly known as:  K-DUR Take 20 mEq by mouth daily. 2 tablets   rOPINIRole 4 MG tablet Commonly known as:  REQUIP Take 4 mg by mouth at bedtime.   SYSTANE BALANCE 0.6 % Soln Generic drug:  Propylene Glycol Place 1 drop into both eyes 2 (two) times daily.   torsemide 20 MG tablet Commonly known as:  DEMADEX Take 40 mg by mouth every Tuesday, Thursday, Saturday, and Sunday. 40 mg on Sun, Tues, Thurs, Sat   torsemide 20 MG tablet Commonly known as:  DEMADEX Take 60 mg by mouth every Monday, Wednesday, and Friday. 3 tabs   Vitamin D3 50000 units Caps Take 1 capsule by mouth every 30 (thirty) days.       No orders of the defined types were placed in this encounter.   Immunization History  Administered Date(s) Administered  . Influenza Split 04/15/2012  . Influenza,inj,Quad PF,6+ Mos 03/06/2014  .  Influenza-Unspecified 03/01/2016, 05/01/2017  . PPD Test 08/30/2014, 09/18/2014  . Pneumococcal Polysaccharide-23 08/29/2013    Social History   Tobacco Use  . Smoking status: Never Smoker  . Smokeless tobacco: Never Used  Substance Use Topics  . Alcohol use: Yes    Alcohol/week: 0.0 oz    Comment: rarely    Review of Systems  DATA OBTAINED: from patient- states things and then forgets about them; she is totally unaware of this; nursing- no acute concerns GENERAL:  no fevers, fatigue, appetite changes SKIN: No itching, rash HEENT: No complaint RESPIRATORY: No cough, wheezing, SOB CARDIAC: No chest pain, palpitations, lower extremity edema  GI: No abdominal pain, No N/V/D or constipation, No  heartburn or reflux  GU: No dysuria, frequency or urgency, or incontinence  MUSCULOSKELETAL: No unrelieved bone/joint pain NEUROLOGIC: No headache, dizziness  PSYCHIATRIC: No overt anxiety or sadness  Vitals:   09/18/17 1458  BP: 108/65  Pulse: 77  Resp: 15  Temp: 98 F (36.7 C)   Body mass index is 17.34 kg/m. Physical Exam  GENERAL APPEARANCE: Alert, conversant, No acute distress  SKIN: No diaphoresis rash HEENT: Unremarkable RESPIRATORY: Breathing is even, unlabored. Lung sounds are clear   CARDIOVASCULAR: Heart RRR no murmurs, rubs or gallops. No peripheral edema  GASTROINTESTINAL: Abdomen is soft, non-tender, not distended w/ normal bowel sounds.  GENITOURINARY: Bladder non tender, not distended  MUSCULOSKELETAL: No abnormal joints or musculature NEUROLOGIC: Cranial nerves 2-12 grossly intact. Moves all extremities PSYCHIATRIC: Mood and affect appropriate to situation with dementia, no behavioral issues  Patient Active Problem List   Diagnosis Date Noted  . Anxiety disorder, unspecified 07/23/2017  . Iron deficiency anemia 03/29/2017  . GERD (gastroesophageal reflux disease) 12/22/2016  . Decreased hearing of both ears 08/19/2016  . Pressure injury of skin 08/15/2016   . Aspiration pneumonia ([place]) 08/14/2016  . Chronic diastolic CHF (congestive heart failure) ([place]) 08/14/2016  . Chronic right shoulder pain 02/09/2016  . Bilateral lower extremity edema 02/02/2016  . Ileitis 01/19/2016  . Pressure ulcer 01/09/2016  . SBO (small bowel obstruction) ([place]) 01/08/2016  . Narcotic addiction ([place]) 05/15/2015  . Diarrhea 03/30/2015  . Acute pharyngitis 02/26/2015  . Osteoarthritis of right knee 02/22/2015  . Pain in joint, lower leg 10/31/2014  . Ulcer of sacral region, stage 4 ([place]) 04/15/2014  . Moderate aortic stenosis 04/14/2014  . Pre-syncope 04/14/2014  . Hypotension 04/14/2014  . Acute renal failure superimposed on stage 2 chronic kidney disease ([place]) 04/14/2014  . Anemia 04/14/2014  . Protein-calorie malnutrition, severe ([place]) 04/14/2014  . Altered mental status   . Dehydration 04/13/2014  . Altered mental state 04/13/2014  . Acute kidney injury ([place])   . Hypokalemia 03/25/2014  . Syncope 03/16/2014  . Acute encephalopathy 03/16/2014  . Leg swelling 03/02/2014  . Avascular necrosis of bone of right hip ([place]) 09/27/2013  . Primary osteoarthritis of right hip 09/07/2013  . Spinal stenosis, lumbar region, with neurogenic claudication 09/07/2013  . Closed fracture of pubic ramus ([place]) 09/07/2013  . BPPV (benign paroxysmal positional vertigo) 09/07/2013  . Pharyngeal swelling 08/30/2013  . Pelvic fracture ([place]) 08/28/2013  . Fall 08/26/2013  . Dizziness 08/26/2013  . Abnormal stress test 08/26/2013  . Preop cardiovascular exam 08/01/2013  . Aortic stenosis 08/01/2013  . Edema 06/30/2013  . Loss of weight 01/04/2013  . Situational stress 01/04/2013  . Chronic right hip pain 07/05/2012  . Knee pain, acute 06/29/2012  . Vertigo 06/24/2012  . Acute exacerbation of chronic low back pain 06/24/2012  . Lumbar pain with radiation down right leg 06/09/2012  . Osteoarthritis of hip 04/29/2012  . History of TIAs 04/17/2012  . S/P total hysterectomy  and bilateral salpingo-oophorectomy 04/17/2012  . Hard of hearing 04/17/2012  . History of anemia 04/17/2012  . RLS (restless legs syndrome) 04/17/2012  . Chronic back pain 04/17/2012  . Spinal stenosis, lumbar 04/17/2012  . Lower back pain 12/20/2011  . Personal history of colonic polyps 10/08/2011  . H/O 09/14/2011  . Essential hypertension 09/14/2011  . Restless legs syndrome 09/14/2011  . Constipation 08/20/2011  . Urinary incontinence 08/20/2011    CMP     Component Value Date/Time   NA 142 08/17/2016 0343  NA 142 08/17/2016   K 3.9 08/17/2016 0343   CL 106 08/17/2016 0343   CO2 28 08/17/2016 0343   GLUCOSE 92 08/17/2016 0343   BUN 22 (H) 08/17/2016 0343   BUN 22 (A) 08/17/2016   CREATININE 1.13 (H) 08/17/2016 0343   CREATININE 0.78 06/28/2014 1205   CALCIUM 7.8 (L) 08/17/2016 0343   PROT 6.6 08/14/2016 1519   ALBUMIN 3.4 (L) 08/14/2016 1519   AST 28 08/14/2016 1519   ALT 17 08/14/2016 1519   ALKPHOS 107 08/14/2016 1519   BILITOT 1.1 08/14/2016 1519   GFRNONAA 40 (L) 08/17/2016 0343   GFRAA 46 (L) 08/17/2016 0343   No results for input(s): NA, K, CL, CO2, GLUCOSE, BUN, CREATININE, CALCIUM, MG, PHOS in the last 8760 hours. No results for input(s): AST, ALT, ALKPHOS, BILITOT, PROT, ALBUMIN in the last 8760 hours. Recent Labs    12/22/16 12/23/16 04/20/17  WBC 6.5 6.6 7.9  HGB 7.5* 7.5* 9.6*  HCT 24* 24* 31*  PLT 287 273 295   No results for input(s): CHOL, LDLCALC, TRIG in the last 8760 hours.  Invalid input(s): HCL No results found for: [facility] Lab Results  Component Value Date   TSH 2.46 04/26/2016   Lab Results  Component Value Date   HGBA1C 5.6 03/19/2016   Lab Results  Component Value Date   CHOL 141 03/19/2016   HDL 55 03/19/2016   LDLCALC 71 03/19/2016   TRIG 77 03/19/2016   CHOLHDL 2.8 03/06/2014    Significant Diagnostic Results in last 30 days:  No results found.  Assessment and Plan  Spinal stenosis, lumbar With chronic back  pain; pain regimen is new, now is MS Contin 15 mg twice daily and oxycodone 5 mg 1 p.o. daily at noon; fentanyl has been DC'd; patient has no complaint to me about this new regimen  RLS (restless legs syndrome) No problems reported; continue Requip 4 mg nightly  Narcotic addiction ([place]) Patient with real problems because pain but it is chronic pain; again patient's pain clinic is done a very good job with her pain control and has decreased her pain medicines again without apparent problem     Webb Silversmith D. [name], MD

## 2017-10-11 ENCOUNTER — Encounter: Payer: Self-pay | Admitting: Internal Medicine

## 2017-10-11 NOTE — Assessment & Plan Note (Signed)
With chronic back pain; pain regimen is new, now is MS Contin 15 mg twice daily and oxycodone 5 mg 1 p.o. daily at noon; fentanyl has been DC'd; patient has no complaint to me about this new regimen

## 2017-10-11 NOTE — Assessment & Plan Note (Signed)
Patient with real problems because pain but it is chronic pain; again patient's pain clinic is done a very good job with her pain control and has decreased her pain medicines again without apparent problem

## 2017-10-11 NOTE — Assessment & Plan Note (Signed)
No problems reported; continue Requip 4 mg nightly

## 2017-10-15 ENCOUNTER — Non-Acute Institutional Stay (SKILLED_NURSING_FACILITY): Payer: Medicare Other | Admitting: Internal Medicine

## 2017-10-15 ENCOUNTER — Encounter: Payer: Self-pay | Admitting: Internal Medicine

## 2017-10-15 DIAGNOSIS — M87051 Idiopathic aseptic necrosis of right femur: Secondary | ICD-10-CM

## 2017-10-15 DIAGNOSIS — D500 Iron deficiency anemia secondary to blood loss (chronic): Secondary | ICD-10-CM | POA: Diagnosis not present

## 2017-10-15 DIAGNOSIS — K5901 Slow transit constipation: Secondary | ICD-10-CM

## 2017-10-15 NOTE — [facility] Notes (Signed)
Location:  [facility] Room Number: 303-D Place of Service:  SNF (31)  [name], MD  Patient Care Team: [name], MD as PCP - General (Internal Medicine) [name], MD as Consulting Physician (Orthopedic Surgery) [name], MD as Consulting Physician (Otolaryngology) Mast, Man X, NP as Nurse Practitioner (Internal Medicine)  Extended Emergency Contact Information Primary Emergency Contact: [name] Address: [place], [place] [address] [name] of [place] Phone: [phone] Relation: Daughter Secondary Emergency Contact: [place], [place] of [place] Mobile Phone: [phone] Relation: Son    Allergies: Amoxicillin; Baclofen; Hctz [hydrochlorothiazide]; and Valium [diazepam]  Chief Complaint  Patient presents with  . Medical Management of Chronic Issues    Routine Visit    HPI: Patient is 84 y.o. female who is being seen for routine issues of constipation, avascular necrosis of the right hip and iron deficiency anemia.  Past Medical History:  Diagnosis Date  . Abnormal stress test 08/26/2013  . Acute encephalopathy   . AKI (acute kidney injury) ([place])   . Altered mental state   . Anemia   . Anxiety disorder, unspecified   . Aortic stenosis   . Arthritis    Back, knees  . Avascular necrosis of bone of right hip ([place]) 09/27/2013  . Bowel incontinence   . BPPV (benign paroxysmal positional vertigo) 09/07/2013  . Closed fracture of pubic ramus ([place]) 09/07/2013  . Colon polyps   . Constipation   . Decreased hearing of both ears 08/19/2016   Last Assessment & Plan:  Concern over hearing loss. Slowly progressive hearing loss.  No history of ear surgery, trauma or infection. EXAMINATION shows normal external canals and tympanic membranes. AUDIOGRAM severe sensorineural hearing loss.  Symmetric in nature. PLAN: Reassured I do not see anything  bad going on.  She is a candidate for hearing aids.   will check her benefits.  . Gastric ulcer    years ago  . History of anemia 04/17/2012  . History of TIAs 04/17/2012   June 2011   . Hypertension   . Hypokalemia   . Pelvic fracture ([place]) 08/28/2013  . Personal history of colonic polyps 10/08/2011   Index polypectomy 2013   . Restless legs syndrome    on Requip  . S/P total hysterectomy and bilateral salpingo-oophorectomy 04/17/2012  . Situational stress   . Spinal stenosis   . Spinal stenosis, lumbar 04/17/2012  . Spinal stenosis, lumbar region, with neurogenic claudication 09/07/2013  . Stroke ([place])    Mini, no residual  . TIA (transient ischemic attack)   . Ulcer causing bleeding and hole in wall of stomach or small intestine   . Urinary bladder calculus   . Urinary incontinence   . Vertigo     Past Surgical History:  Procedure Laterality Date  . COLON RESECTION  50 years ago  . LUMBAR LAMINECTOMY/DECOMPRESSION MICRODISCECTOMY  06/25/2011   Procedure: LUMBAR LAMINECTOMY/DECOMPRESSION MICRODISCECTOMY;  Surgeon: Hosie Spangle, MD;  Location: [facility];  Service: Neurosurgery;  Laterality: Right;  RIGHT Lumbar Two-Three hemilaminectomy and microdiskectomy  . TONSILLECTOMY    . TOTAL ABDOMINAL HYSTERECTOMY W/ BILATERAL SALPINGOOPHORECTOMY  04/17/2012  . UTERINE FIBROID SURGERY      Allergies as of 10/15/2017      Reactions   Amoxicillin Other (See Comments)   Reaction unknown  Baclofen Other (See Comments)   "fuzzy in the eyes" and dizzy   Hctz [hydrochlorothiazide]    nausea   Valium [diazepam] Other (See Comments)   Pt became unresponsive and O2 sats dropped.      Medication List        Accurate as of 10/15/17 11:59 PM. Always use your most recent med list.          acetaminophen 325 MG tablet Commonly known as:  TYLENOL Take 325 mg by mouth every 6 (six) hours as needed. Take 2 tablets every 6 hours as needed. Notify doctor if not relieve. Not ex  exceed 3000 mg in 24 hour period.   feeding supplement (PRO-STAT SUGAR FREE 64) Liqd Take 30 mLs by mouth 2 (two) times daily between meals. support weight status   ferrous sulfate 325 (65 FE) MG tablet Take 325 mg by mouth 2 (two) times daily.   lubiprostone 24 MCG capsule Commonly known as:  AMITIZA Take 24 mcg by mouth 2 (two) times daily with a meal.   Melatonin 3 MG Tabs Take 1 tablet by mouth at bedtime.   mirtazapine 15 MG tablet Commonly known as:  REMERON Take 15 mg by mouth at bedtime.   morphine 15 MG 12 hr tablet Commonly known as:  MS CONTIN Take 1 tablet (15 mg total) by mouth 2 (two) times daily.   multivitamin tablet Take 1 tablet by mouth daily.   MYRBETRIQ 50 MG Tb24 tablet Generic drug:  mirabegron ER Take 50 mg by mouth daily. bladder   ondansetron 4 MG tablet Commonly known as:  ZOFRAN Take 4 mg by mouth every 12 (twelve) hours as needed for nausea or vomiting.   oxyCODONE 5 MG immediate release tablet Commonly known as:  Oxy IR/ROXICODONE Take 1 tablet (5 mg total) by mouth daily. At 12 pm   pantoprazole 20 MG tablet Commonly known as:  PROTONIX Take 20 mg by mouth daily.   potassium chloride 10 MEQ tablet Commonly known as:  K-DUR Take 20 mEq by mouth daily. 2 tablets   rOPINIRole 4 MG tablet Commonly known as:  REQUIP Take 4 mg by mouth at bedtime.   SYSTANE BALANCE 0.6 % Soln Generic drug:  Propylene Glycol Place 1 drop into both eyes 2 (two) times daily.   torsemide 20 MG tablet Commonly known as:  DEMADEX Take 40 mg by mouth every Tuesday, Thursday, Saturday, and Sunday.   torsemide 20 MG tablet Commonly known as:  DEMADEX Take 60 mg by mouth every Monday, Wednesday, and Friday. 3 tabs   Vitamin D3 50000 units Caps Take 1 capsule by mouth every 30 (thirty) days.       No orders of the defined types were placed in this encounter.   Immunization History  Administered Date(s) Administered  . Influenza Split 04/15/2012    . Influenza,inj,Quad PF,6+ Mos 03/06/2014  . Influenza-Unspecified 03/01/2016, 05/01/2017  . PPD Test 08/30/2014, 09/18/2014  . Pneumococcal Conjugate-13 09/25/2017  . Pneumococcal Polysaccharide-23 08/29/2013    Social History   Tobacco Use  . Smoking status: Never Smoker  . Smokeless tobacco: Never Used  Substance Use Topics  . Alcohol use: Yes    Alcohol/week: 0.0 oz    Comment: rarely    Review of Systems  DATA OBTAINED: from patient-limited; nursing-no acute concerns GENERAL:  no fevers, fatigue, appetite changes SKIN: No itching, rash HEENT: No complaint RESPIRATORY: No cough, wheezing, SOB CARDIAC: No chest pain, palpitations, lower extremity edema  GI: No abdominal  pain, No N/V/D or constipation, No heartburn or reflux  GU: No dysuria, frequency or urgency, or incontinence  MUSCULOSKELETAL: No unrelieved bone/joint pain NEUROLOGIC: No headache, dizziness  PSYCHIATRIC: No overt anxiety or sadness  Vitals:   10/15/17 1051  BP: 123/65  Pulse: 88  Resp: 18  Temp: 98.9 F (37.2 C)   Body mass index is 17.81 kg/m. Physical Exam  GENERAL APPEARANCE: Alert, conversant, No acute distress  SKIN: No diaphoresis rash HEENT: Unremarkable RESPIRATORY: Breathing is even, unlabored. Lung sounds are clear   CARDIOVASCULAR: Heart RRR no murmurs, rubs or gallops. No peripheral edema  GASTROINTESTINAL: Abdomen is soft, non-tender, not distended w/ normal bowel sounds.  GENITOURINARY: Bladder non tender, not distended  MUSCULOSKELETAL: No abnormal joints or musculature NEUROLOGIC: Cranial nerves 2-12 grossly intact. Moves all extremities PSYCHIATRIC: Mood and affect appropriate to situation with some dementia, no behavioral issues  Patient Active Problem List   Diagnosis Date Noted  . Anxiety disorder, unspecified 07/23/2017  . Iron deficiency anemia 03/29/2017  . GERD (gastroesophageal reflux disease) 12/22/2016  . Decreased hearing of both ears 08/19/2016  .  Pressure injury of skin 08/15/2016  . Aspiration pneumonia ([place]) 08/14/2016  . Chronic diastolic CHF (congestive heart failure) ([place]) 08/14/2016  . Chronic right shoulder pain 02/09/2016  . Bilateral lower extremity edema 02/02/2016  . Ileitis 01/19/2016  . Pressure ulcer 01/09/2016  . SBO (small bowel obstruction) ([place]) 01/08/2016  . Narcotic addiction ([place]) 05/15/2015  . Diarrhea 03/30/2015  . Acute pharyngitis 02/26/2015  . Osteoarthritis of right knee 02/22/2015  . Pain in joint, lower leg 10/31/2014  . Ulcer of sacral region, stage 4 ([place]) 04/15/2014  . Moderate aortic stenosis 04/14/2014  . Pre-syncope 04/14/2014  . Hypotension 04/14/2014  . Acute renal failure superimposed on stage 2 chronic kidney disease ([place]) 04/14/2014  . Anemia 04/14/2014  . Protein-calorie malnutrition, severe ([place]) 04/14/2014  . Altered mental status   . Dehydration 04/13/2014  . Altered mental state 04/13/2014  . Acute kidney injury ([place])   . Hypokalemia 03/25/2014  . Syncope 03/16/2014  . Acute encephalopathy 03/16/2014  . Leg swelling 03/02/2014  . Avascular necrosis of bone of right hip ([place]) 09/27/2013  . Primary osteoarthritis of right hip 09/07/2013  . Spinal stenosis, lumbar region, with neurogenic claudication 09/07/2013  . Closed fracture of pubic ramus ([place]) 09/07/2013  . BPPV (benign paroxysmal positional vertigo) 09/07/2013  . Pharyngeal swelling 08/30/2013  . Pelvic fracture ([place]) 08/28/2013  . Fall 08/26/2013  . Dizziness 08/26/2013  . Abnormal stress test 08/26/2013  . Preop cardiovascular exam 08/01/2013  . Aortic stenosis 08/01/2013  . Edema 06/30/2013  . Loss of weight 01/04/2013  . Situational stress 01/04/2013  . Chronic right hip pain 07/05/2012  . Knee pain, acute 06/29/2012  . Vertigo 06/24/2012  . Acute exacerbation of chronic low back pain 06/24/2012  . Lumbar pain with radiation down right leg 06/09/2012  . Osteoarthritis of hip 04/29/2012  . History of TIAs  04/17/2012  . S/P total hysterectomy and bilateral salpingo-oophorectomy 04/17/2012  . Hard of hearing 04/17/2012  . History of anemia 04/17/2012  . RLS (restless legs syndrome) 04/17/2012  . Chronic back pain 04/17/2012  . Spinal stenosis, lumbar 04/17/2012  . Lower back pain 12/20/2011  . Personal history of colonic polyps 10/08/2011  . H/O 09/14/2011  . Essential hypertension 09/14/2011  . Restless legs syndrome 09/14/2011  . Constipation 08/20/2011  . Urinary incontinence 08/20/2011    CMP     Component Value Date/Time  NA 142 08/17/2016 0343   NA 142 08/17/2016   K 3.9 08/17/2016 0343   CL 106 08/17/2016 0343   CO2 28 08/17/2016 0343   GLUCOSE 92 08/17/2016 0343   BUN 22 (H) 08/17/2016 0343   BUN 22 (A) 08/17/2016   CREATININE 1.13 (H) 08/17/2016 0343   CREATININE 0.78 06/28/2014 1205   CALCIUM 7.8 (L) 08/17/2016 0343   PROT 6.6 08/14/2016 1519   ALBUMIN 3.4 (L) 08/14/2016 1519   AST 28 08/14/2016 1519   ALT 17 08/14/2016 1519   ALKPHOS 107 08/14/2016 1519   BILITOT 1.1 08/14/2016 1519   GFRNONAA 40 (L) 08/17/2016 0343   GFRAA 46 (L) 08/17/2016 0343   No results for input(s): NA, K, CL, CO2, GLUCOSE, BUN, CREATININE, CALCIUM, MG, PHOS in the last 8760 hours. No results for input(s): AST, ALT, ALKPHOS, BILITOT, PROT, ALBUMIN in the last 8760 hours. Recent Labs    12/22/16 12/23/16 04/20/17  WBC 6.5 6.6 7.9  HGB 7.5* 7.5* 9.6*  HCT 24* 24* 31*  PLT 287 273 295   No results for input(s): CHOL, LDLCALC, TRIG in the last 8760 hours.  Invalid input(s): HCL No results found for: [facility] Lab Results  Component Value Date   TSH 2.46 04/26/2016   Lab Results  Component Value Date   HGBA1C 5.6 03/19/2016   Lab Results  Component Value Date   CHOL 141 03/19/2016   HDL 55 03/19/2016   LDLCALC 71 03/19/2016   TRIG 77 03/19/2016   CHOLHDL 2.8 03/06/2014    Significant Diagnostic Results in last 30 days:  No results found.  Assessment and  Plan  Constipation : Chronic; continue Amitiza 24 mcg p.o. twice daily  Avascular necrosis of bone of right hip Chronic; treated with MS Contin 15 mg twice daily and oxycodone 5 mg 1 daily with moderate success; patient is followed by pain clinic  Anemia Globin stable; continue iron 325 mg twice daily     Karee Forge D. [name], MD

## 2017-11-06 ENCOUNTER — Non-Acute Institutional Stay (SKILLED_NURSING_FACILITY): Payer: Medicare Other | Admitting: Internal Medicine

## 2017-11-06 ENCOUNTER — Encounter: Payer: Self-pay | Admitting: Internal Medicine

## 2017-11-06 DIAGNOSIS — G8929 Other chronic pain: Secondary | ICD-10-CM

## 2017-11-06 DIAGNOSIS — I10 Essential (primary) hypertension: Secondary | ICD-10-CM | POA: Diagnosis not present

## 2017-11-06 DIAGNOSIS — M160 Bilateral primary osteoarthritis of hip: Secondary | ICD-10-CM

## 2017-11-06 DIAGNOSIS — M545 Low back pain, unspecified: Secondary | ICD-10-CM

## 2017-11-06 NOTE — [facility] Notes (Signed)
Location:  [facility] Room Number: 303-D Place of Service:  SNF (31)  [name], MD  Patient Care Team: [name], MD as PCP - General (Internal Medicine) [name], MD as Consulting Physician (Orthopedic Surgery) [name], MD as Consulting Physician (Otolaryngology) Mast, Man X, NP as Nurse Practitioner (Internal Medicine)  Extended Emergency Contact Information Primary Emergency Contact: [name] Address: [place],  [address] [name] of [place] Phone: [phone] Relation: Daughter Secondary Emergency Contact: [place], [place] Mobile Phone: [phone] Relation: Son    Allergies: Amoxicillin; Baclofen; Hctz [hydrochlorothiazide]; and Valium [diazepam]  Chief Complaint  Patient presents with  . Medical Management of Chronic Issues    HPI: Patient is 84 y.o. female who is being seen for routine issues of hypertension, chronic back pain, and chronic hip pain.  Past Medical History:  Diagnosis Date  . Abnormal stress test 08/26/2013  . Acute encephalopathy   . AKI (acute kidney injury) ([place])   . Altered mental state   . Anemia   . Anxiety disorder, unspecified   . Aortic stenosis   . Arthritis    Back, knees  . Avascular necrosis of bone of right hip ([place]) 09/27/2013  . Bowel incontinence   . BPPV (benign paroxysmal positional vertigo) 09/07/2013  . Closed fracture of pubic ramus ([place]) 09/07/2013  . Colon polyps   . Constipation   . Decreased hearing of both ears 08/19/2016   Last Assessment & Plan:  Concern over hearing loss. Slowly progressive hearing loss.  No history of ear surgery, trauma or infection. EXAMINATION shows normal external canals and tympanic membranes. AUDIOGRAM severe sensorineural hearing loss.  Symmetric in nature. PLAN: Reassured I do not see anything bad going on.  She is a candidate for hearing  aids.   will check her benefits.  . Gastric ulcer    years ago  . History of anemia 04/17/2012  . History of TIAs 04/17/2012   June 2011   . Hypertension   . Hypokalemia   . Pelvic fracture ([place]) 08/28/2013  . Personal history of colonic polyps 10/08/2011   Index polypectomy 2013   . Restless legs syndrome    on Requip  . S/P total hysterectomy and bilateral salpingo-oophorectomy 04/17/2012  . Situational stress   . Spinal stenosis   . Spinal stenosis, lumbar 04/17/2012  . Spinal stenosis, lumbar region, with neurogenic claudication 09/07/2013  . Stroke ([place])    Mini, no residual  . TIA (transient ischemic attack)   . Ulcer causing bleeding and hole in wall of stomach or small intestine   . Urinary bladder calculus   . Urinary incontinence   . Vertigo     Past Surgical History:  Procedure Laterality Date  . COLON RESECTION  50 years ago  . LUMBAR LAMINECTOMY/DECOMPRESSION MICRODISCECTOMY  06/25/2011   Procedure: LUMBAR LAMINECTOMY/DECOMPRESSION MICRODISCECTOMY;  Surgeon: Hosie Spangle, MD;  Location: [facility];  Service: Neurosurgery;  Laterality: Right;  RIGHT Lumbar Two-Three hemilaminectomy and microdiskectomy  . TONSILLECTOMY    . TOTAL ABDOMINAL HYSTERECTOMY W/ BILATERAL SALPINGOOPHORECTOMY  04/17/2012  . UTERINE FIBROID SURGERY      Allergies as of 11/06/2017      Reactions   Amoxicillin Other (See Comments)   Reaction unknown   Baclofen Other (See Comments)   "fuzzy in the  eyes" and dizzy   Hctz [hydrochlorothiazide]    nausea   Valium [diazepam] Other (See Comments)   Pt became unresponsive and O2 sats dropped.      Medication List        Accurate as of 11/06/17 11:59 PM. Always use your most recent med list.          acetaminophen 325 MG tablet Commonly known as:  TYLENOL Take 325 mg by mouth every 6 (six) hours as needed. Take 2 tablets every 6 hours as needed. Notify doctor if not relieve. Not ex exceed 3000 mg in 24 hour period.   feeding  supplement (PRO-STAT SUGAR FREE 64) Liqd Take 30 mLs by mouth 2 (two) times daily between meals. support weight status   ferrous sulfate 325 (65 FE) MG tablet Take 325 mg by mouth 2 (two) times daily.   lubiprostone 24 MCG capsule Commonly known as:  AMITIZA Take 24 mcg by mouth 2 (two) times daily with a meal.   Melatonin 3 MG Tabs Take 1 tablet by mouth at bedtime.   mirtazapine 15 MG tablet Commonly known as:  REMERON Take 15 mg by mouth at bedtime.   morphine 15 MG 12 hr tablet Commonly known as:  MS CONTIN Take 1 tablet (15 mg total) by mouth 2 (two) times daily.   multivitamin tablet Take 1 tablet by mouth daily.   MYRBETRIQ 50 MG Tb24 tablet Generic drug:  mirabegron ER Take 50 mg by mouth daily. bladder   ondansetron 4 MG tablet Commonly known as:  ZOFRAN Take 4 mg by mouth every 12 (twelve) hours as needed for nausea or vomiting.   oxyCODONE 5 MG immediate release tablet Commonly known as:  Oxy IR/ROXICODONE Take 1 tablet (5 mg total) by mouth daily. At 12 pm   pantoprazole 20 MG tablet Commonly known as:  PROTONIX Take 20 mg by mouth daily.   potassium chloride 10 MEQ tablet Commonly known as:  K-DUR Take 20 mEq by mouth daily. 2 tablets   rOPINIRole 4 MG tablet Commonly known as:  REQUIP Take 4 mg by mouth at bedtime.   SYSTANE BALANCE 0.6 % Soln Generic drug:  Propylene Glycol Place 1 drop into both eyes 2 (two) times daily.   torsemide 20 MG tablet Commonly known as:  DEMADEX Take 40 mg by mouth every Tuesday, Thursday, Saturday, and Sunday.   torsemide 20 MG tablet Commonly known as:  DEMADEX Take 60 mg by mouth every Monday, Wednesday, and Friday. 3 tabs   Vitamin D3 50000 units Caps Take 1 capsule by mouth every 30 (thirty) days.       No orders of the defined types were placed in this encounter.   Immunization History  Administered Date(s) Administered  . Influenza Split 04/15/2012  . Influenza,inj,Quad PF,6+ Mos 03/06/2014  .  Influenza-Unspecified 03/01/2016, 05/01/2017  . PPD Test 08/30/2014, 09/18/2014  . Pneumococcal Conjugate-13 09/25/2017  . Pneumococcal Polysaccharide-23 08/29/2013    Social History   Tobacco Use  . Smoking status: Never Smoker  . Smokeless tobacco: Never Used  Substance Use Topics  . Alcohol use: Yes    Alcohol/week: 0.0 oz    Comment: rarely    Review of Systems  DATA OBTAINED: from patient-limited; nursing-no acute concerns GENERAL:  no fevers, fatigue, appetite changes SKIN: No itching, rash HEENT: No complaint RESPIRATORY: No cough, wheezing, SOB CARDIAC: No chest pain, palpitations, lower extremity edema  GI: No abdominal pain, No N/V/D or constipation, No heartburn or reflux  GU: No dysuria, frequency or urgency, or incontinence  MUSCULOSKELETAL: No unrelieved bone/joint pain NEUROLOGIC: No headache, dizziness  PSYCHIATRIC: No overt anxiety or sadness  Vitals:   11/06/17 1424  BP: 105/61  Pulse: 91  Resp: 19  Temp: 97.9 F (36.6 C)  SpO2: 98%   Body mass index is 17.81 kg/m. Physical Exam  GENERAL APPEARANCE: Alert, conversant, No acute distress  SKIN: No diaphoresis rash HEENT: Unremarkable RESPIRATORY: Breathing is even, unlabored. Lung sounds are clear   CARDIOVASCULAR: Heart RRR no murmurs, rubs or gallops. No peri with dementia pheral edema  GASTROINTESTINAL: Abdomen is soft, non-tender, not distended w/ normal bowel sounds.  GENITOURINARY: Bladder non tender, not distended  MUSCULOSKELETAL: No abnormal joints or musculature NEUROLOGIC: Cranial nerves 2-12 grossly intact. Moves all extremities PSYCHIATRIC: Mood and affect appropriate to situation, no behavioral issues  Patient Active Problem List   Diagnosis Date Noted  . Anxiety disorder, unspecified 07/23/2017  . Iron deficiency anemia 03/29/2017  . GERD (gastroesophageal reflux disease) 12/22/2016  . Decreased hearing of both ears 08/19/2016  . Pressure injury of skin 08/15/2016  .  Aspiration pneumonia ([place]) 08/14/2016  . Chronic diastolic CHF (congestive heart failure) ([place]) 08/14/2016  . Chronic right shoulder pain 02/09/2016  . Bilateral lower extremity edema 02/02/2016  . Ileitis 01/19/2016  . Pressure ulcer 01/09/2016  . SBO (small bowel obstruction) ([place]) 01/08/2016  . Narcotic addiction ([place]) 05/15/2015  . Diarrhea 03/30/2015  . Acute pharyngitis 02/26/2015  . Osteoarthritis of right knee 02/22/2015  . Pain in joint, lower leg 10/31/2014  . Ulcer of sacral region, stage 4 ([place]) 04/15/2014  . Moderate aortic stenosis 04/14/2014  . Pre-syncope 04/14/2014  . Hypotension 04/14/2014  . Acute renal failure superimposed on stage 2 chronic kidney disease ([place]) 04/14/2014  . Anemia 04/14/2014  . Protein-calorie malnutrition, severe ([place]) 04/14/2014  . Altered mental status   . Dehydration 04/13/2014  . Altered mental state 04/13/2014  . Acute kidney injury ([place])   . Hypokalemia 03/25/2014  . Syncope 03/16/2014  . Acute encephalopathy 03/16/2014  . Leg swelling 03/02/2014  . Avascular necrosis of bone of right hip ([place]) 09/27/2013  . Primary osteoarthritis of right hip 09/07/2013  . Spinal stenosis, lumbar region, with neurogenic claudication 09/07/2013  . Closed fracture of pubic ramus ([place]) 09/07/2013  . BPPV (benign paroxysmal positional vertigo) 09/07/2013  . Pharyngeal swelling 08/30/2013  . Pelvic fracture ([place]) 08/28/2013  . Fall 08/26/2013  . Dizziness 08/26/2013  . Abnormal stress test 08/26/2013  . Preop cardiovascular exam 08/01/2013  . Aortic stenosis 08/01/2013  . Edema 06/30/2013  . Loss of weight 01/04/2013  . Situational stress 01/04/2013  . Chronic right hip pain 07/05/2012  . Knee pain, acute 06/29/2012  . Vertigo 06/24/2012  . Acute exacerbation of chronic low back pain 06/24/2012  . Lumbar pain with radiation down right leg 06/09/2012  . Osteoarthritis of hip 04/29/2012  . History of TIAs 04/17/2012  . S/P total hysterectomy and  bilateral salpingo-oophorectomy 04/17/2012  . Hard of hearing 04/17/2012  . History of anemia 04/17/2012  . RLS (restless legs syndrome) 04/17/2012  . Chronic back pain 04/17/2012  . Spinal stenosis, lumbar 04/17/2012  . Lower back pain 12/20/2011  . Personal history of colonic polyps 10/08/2011  . H/O 09/14/2011  . Essential hypertension 09/14/2011  . Restless legs syndrome 09/14/2011  . Constipation 08/20/2011  . Urinary incontinence 08/20/2011    CMP     Component Value Date/Time   NA 142 08/17/2016 0343  NA 142 08/17/2016   K 3.9 08/17/2016 0343   CL 106 08/17/2016 0343   CO2 28 08/17/2016 0343   GLUCOSE 92 08/17/2016 0343   BUN 22 (H) 08/17/2016 0343   BUN 22 (A) 08/17/2016   CREATININE 1.13 (H) 08/17/2016 0343   CREATININE 0.78 06/28/2014 1205   CALCIUM 7.8 (L) 08/17/2016 0343   PROT 6.6 08/14/2016 1519   ALBUMIN 3.4 (L) 08/14/2016 1519   AST 28 08/14/2016 1519   ALT 17 08/14/2016 1519   ALKPHOS 107 08/14/2016 1519   BILITOT 1.1 08/14/2016 1519   GFRNONAA 40 (L) 08/17/2016 0343   GFRAA 46 (L) 08/17/2016 0343   No results for input(s): NA, K, CL, CO2, GLUCOSE, BUN, CREATININE, CALCIUM, MG, PHOS in the last 8760 hours. No results for input(s): AST, ALT, ALKPHOS, BILITOT, PROT, ALBUMIN in the last 8760 hours. Recent Labs    12/22/16 12/23/16 04/20/17  WBC 6.5 6.6 7.9  HGB 7.5* 7.5* 9.6*  HCT 24* 24* 31*  PLT 287 273 295   No results for input(s): CHOL, LDLCALC, TRIG in the last 8760 hours.  Invalid input(s): HCL No results found for: [facility] Lab Results  Component Value Date   TSH 2.46 04/26/2016   Lab Results  Component Value Date   HGBA1C 5.6 03/19/2016   Lab Results  Component Value Date   CHOL 141 03/19/2016   HDL 55 03/19/2016   LDLCALC 71 03/19/2016   TRIG 77 03/19/2016   CHOLHDL 2.8 03/06/2014    Significant Diagnostic Results in last 30 days:  No results found.  Assessment and Plan  Essential hypertension Is controlled; continue  Demadex 60 mg Monday Wednesday Friday and 40 mg all other days  Chronic back pain Stable; pain regimen is controlled by patient's pain clinic; continue MS 15 mg twice daily and oxycodone 5 mg daily  Osteoarthritis of hip Patient with necrosis of hip, before chronic pain; pain regimen is controlled by pain clinic; continue MS Contin 15 mg twice daily oxycodone 5 mg daily    Trenese Haft D. [name], MD

## 2017-11-09 ENCOUNTER — Non-Acute Institutional Stay (INDEPENDENT_AMBULATORY_CARE_PROVIDER_SITE_OTHER): Payer: Medicare Other

## 2017-11-09 DIAGNOSIS — Z0000 Encounter for general adult medical examination without abnormal findings: Secondary | ICD-10-CM | POA: Diagnosis not present

## 2017-11-09 NOTE — Patient Instructions (Addendum)
Shelly Martin , Thank you for taking time to come for your Medicare Wellness Visit. I appreciate your ongoing commitment to your health goals. Please review the following plan we discussed and let me know if I can assist you in the future.   Screening recommendations/referrals: Colonoscopy excluded, over age 82 Mammogram excluded, over age 56 Bone Density due, ordered Recommended yearly ophthalmology/optometry visit for glaucoma screening and checkup Recommended yearly dental visit for hygiene and checkup  Vaccinations: Influenza vaccine up to date, due 2019 fall season Pneumococcal vaccine up to date, completed Tdap vaccine due, ordered Shingles vaccine not in past records    Advanced directives: in chart  Conditions/risks identified: none  Next appointment: Dr. [name] makes rounds   Preventive Care 65 Years and Older, Female Preventive care refers to lifestyle choices and visits with your health care provider that can promote health and wellness. What does preventive care include?  A yearly physical exam. This is also called an annual well check.  Dental exams once or twice a year.  Routine eye exams. Ask your health care provider how often you should have your eyes checked.  Personal lifestyle choices, including:  Daily care of your teeth and gums.  Regular physical activity.  Eating a healthy diet.  Avoiding tobacco and drug use.  Limiting alcohol use.  Practicing safe sex.  Taking low-dose aspirin every day.  Taking vitamin and mineral supplements as recommended by your health care provider. What happens during an annual well check? The services and screenings done by your health care provider during your annual well check will depend on your age, overall health, lifestyle risk factors, and family history of disease. Counseling  Your health care provider may ask you questions about your:  Alcohol use.  Tobacco use.  Drug use.  Emotional  well-being.  Home and relationship well-being.  Sexual activity.  Eating habits.  History of falls.  Memory and ability to understand (cognition).  Work and work Statistician.  Reproductive health. Screening  You may have the following tests or measurements:  Height, weight, and BMI.  Blood pressure.  Lipid and cholesterol levels. These may be checked every 5 years, or more frequently if you are over 59 years old.  Skin check.  Lung cancer screening. You may have this screening every year starting at age 76 if you have a 30-pack-year history of smoking and currently smoke or have quit within the past 15 years.  Fecal occult blood test (FOBT) of the stool. You may have this test every year starting at age 70.  Flexible sigmoidoscopy or colonoscopy. You may have a sigmoidoscopy every 5 years or a colonoscopy every 10 years starting at age 75.  Hepatitis C blood test.  Hepatitis B blood test.  Sexually transmitted disease (STD) testing.  Diabetes screening. This is done by checking your blood sugar (glucose) after you have not eaten for a while (fasting). You may have this done every 1-3 years.  Bone density scan. This is done to screen for osteoporosis. You may have this done starting at age 81.  Mammogram. This may be done every 1-2 years. Talk to your health care provider about how often you should have regular mammograms. Talk with your health care provider about your test results, treatment options, and if necessary, the need for more tests. Vaccines  Your health care provider may recommend certain vaccines, such as:  Influenza vaccine. This is recommended every year.  Tetanus, diphtheria, and acellular pertussis (Tdap, Td) vaccine. You may  need a Td booster every 10 years.  Zoster vaccine. You may need this after age 60.  Pneumococcal 13-valent conjugate (PCV13) vaccine. One dose is recommended after age 31.  Pneumococcal polysaccharide (PPSV23) vaccine. One  dose is recommended after age 33. Talk to your health care provider about which screenings and vaccines you need and how often you need them. This information is not intended to replace advice given to you by your health care provider. Make sure you discuss any questions you have with your health care provider. Document Released: 06/08/2015 Document Revised: 01/30/2016 Document Reviewed: 03/13/2015 Elsevier Interactive Patient Education  2017 [place] Prevention in the Home Falls can cause injuries. They can happen to people of all ages. There are many things you can do to make your home safe and to help prevent falls. What can I do on the outside of my home?  Regularly fix the edges of walkways and driveways and fix any cracks.  Remove anything that might make you trip as you walk through a door, such as a raised step or threshold.  Trim any bushes or trees on the path to your home.  Use bright outdoor lighting.  Clear any walking paths of anything that might make someone trip, such as rocks or tools.  Regularly check to see if handrails are loose or broken. Make sure that both sides of any steps have handrails.  Any raised decks and porches should have guardrails on the edges.  Have any leaves, snow, or ice cleared regularly.  Use sand or salt on walking paths during winter.  Clean up any spills in your garage right away. This includes oil or grease spills. What can I do in the bathroom?  Use night lights.  Install grab bars by the toilet and in the tub and shower. Do not use towel bars as grab bars.  Use non-skid mats or decals in the tub or shower.  If you need to sit down in the shower, use a plastic, non-slip stool.  Keep the floor dry. Clean up any water that spills on the floor as soon as it happens.  Remove soap buildup in the tub or shower regularly.  Attach bath mats securely with double-sided non-slip rug tape.  Do not have throw rugs and other  things on the floor that can make you trip. What can I do in the bedroom?  Use night lights.  Make sure that you have a light by your bed that is easy to reach.  Do not use any sheets or blankets that are too big for your bed. They should not hang down onto the floor.  Have a firm chair that has side arms. You can use this for support while you get dressed.  Do not have throw rugs and other things on the floor that can make you trip. What can I do in the kitchen?  Clean up any spills right away.  Avoid walking on wet floors.  Keep items that you use a lot in easy-to-reach places.  If you need to reach something above you, use a strong step stool that has a grab bar.  Keep electrical cords out of the way.  Do not use floor polish or wax that makes floors slippery. If you must use wax, use non-skid floor wax.  Do not have throw rugs and other things on the floor that can make you trip. What can I do with my stairs?  Do not leave any items on  the stairs.  Make sure that there are handrails on both sides of the stairs and use them. Fix handrails that are broken or loose. Make sure that handrails are as long as the stairways.  Check any carpeting to make sure that it is firmly attached to the stairs. Fix any carpet that is loose or worn.  Avoid having throw rugs at the top or bottom of the stairs. If you do have throw rugs, attach them to the floor with carpet tape.  Make sure that you have a light switch at the top of the stairs and the bottom of the stairs. If you do not have them, ask someone to add them for you. What else can I do to help prevent falls?  Wear shoes that:  Do not have high heels.  Have rubber bottoms.  Are comfortable and fit you well.  Are closed at the toe. Do not wear sandals.  If you use a stepladder:  Make sure that it is fully opened. Do not climb a closed stepladder.  Make sure that both sides of the stepladder are locked into place.  Ask  someone to hold it for you, if possible.  Clearly mark and make sure that you can see:  Any grab bars or handrails.  First and last steps.  Where the edge of each step is.  Use tools that help you move around (mobility aids) if they are needed. These include:  Canes.  Walkers.  Scooters.  Crutches.  Turn on the lights when you go into a dark area. Replace any light bulbs as soon as they burn out.  Set up your furniture so you have a clear path. Avoid moving your furniture around.  If any of your floors are uneven, fix them.  If there are any pets around you, be aware of where they are.  Review your medicines with your doctor. Some medicines can make you feel dizzy. This can increase your chance of falling. Ask your doctor what other things that you can do to help prevent falls. This information is not intended to replace advice given to you by your health care provider. Make sure you discuss any questions you have with your health care provider. Document Released: 03/08/2009 Document Revised: 10/18/2015 Document Reviewed: 06/16/2014 Elsevier Interactive Patient Education  2017 [employer].

## 2017-11-09 NOTE — Progress Notes (Signed)
Subjective:   Shelly Martin is a 83 y.o. female who presents for Medicare Annual (Subsequent) preventive examination at [place]  Last AWV-11/07/2016    Objective:     Vitals: BP 110/60 (BP Location: Left Arm, Patient Position: Sitting)   Pulse 97   Temp 99.2 F (37.3 C) (Oral)   Ht 5' (1.524 m)   Wt 91 lb (41.3 kg)   SpO2 95%   BMI 17.77 kg/m   Body mass index is 17.77 kg/m.  Advanced Directives 11/09/2017 11/06/2017 09/18/2017 08/12/2017 06/24/2017 05/14/2017 04/21/2017  Does Patient Have a Medical Advance Directive? Yes Yes Yes Yes Yes Yes Yes  Type of Advance Directive Out of facility DNR (pink MOST or yellow form) Out of facility DNR (pink MOST or yellow form) Out of facility DNR (pink MOST or yellow form) Out of facility DNR (pink MOST or yellow form) Out of facility DNR (pink MOST or yellow form) Out of facility DNR (pink MOST or yellow form) Out of facility DNR (pink MOST or yellow form)  Does patient want to make changes to medical advance directive? No - Patient declined No - Patient declined No - Patient declined - - - -  Copy of Press photographer in Chart? - - - - - - -  Would patient like information on creating a medical advance directive? - - - - - - -  Pre-existing out of facility DNR order (yellow form or pink MOST form) Yellow form placed in chart (order not valid for inpatient use) - - Yellow form placed in chart (order not valid for inpatient use) Yellow form placed in chart (order not valid for inpatient use) Yellow form placed in chart (order not valid for inpatient use) Yellow form placed in chart (order not valid for inpatient use)    Tobacco Social History   Tobacco Use  Smoking Status Never Smoker  Smokeless Tobacco Never Used     Counseling given: Not Answered   Clinical Intake:  Pre-visit preparation completed: No  Pain : 0-10 Pain Score: 10-Worst pain ever Pain Type: Chronic pain Pain Location: Hip Pain  Orientation: Right Pain Onset: More than a month ago Pain Frequency: Constant     Nutritional Risks: None Diabetes: No  How often do you need to have someone help you when you read instructions, pamphlets, or other written materials from your doctor or pharmacy?: 2 - Rarely  Interpreter Needed?: No  Information entered by :: Tyson Dense, RN  Past Medical History:  Diagnosis Date  . Abnormal stress test 08/26/2013  . Acute encephalopathy   . AKI (acute kidney injury) ([place])   . Altered mental state   . Anemia   . Anxiety disorder, unspecified   . Aortic stenosis   . Arthritis    Back, knees  . Avascular necrosis of bone of right hip ([place]) 09/27/2013  . Bowel incontinence   . BPPV (benign paroxysmal positional vertigo) 09/07/2013  . Closed fracture of pubic ramus ([place]) 09/07/2013  . Colon polyps   . Constipation   . Decreased hearing of both ears 08/19/2016   Last Assessment & Plan:  Concern over hearing loss. Slowly progressive hearing loss.  No history of ear surgery, trauma or infection. EXAMINATION shows normal external canals and tympanic membranes. AUDIOGRAM severe sensorineural hearing loss.  Symmetric in nature. PLAN: Reassured I do not see anything bad going on.  She is a candidate for hearing aids.   will check her benefits.  Marland Kitchen  Gastric ulcer    years ago  . History of anemia 04/17/2012  . History of TIAs 04/17/2012   June 2011   . Hypertension   . Hypokalemia   . Pelvic fracture ([place]) 08/28/2013  . Personal history of colonic polyps 10/08/2011   Index polypectomy 2013   . Restless legs syndrome    on Requip  . S/P total hysterectomy and bilateral salpingo-oophorectomy 04/17/2012  . Situational stress   . Spinal stenosis   . Spinal stenosis, lumbar 04/17/2012  . Spinal stenosis, lumbar region, with neurogenic claudication 09/07/2013  . Stroke ([place])    Mini, no residual  . TIA (transient ischemic attack)   . Ulcer causing bleeding and hole in wall of stomach or small  intestine   . Urinary bladder calculus   . Urinary incontinence   . Vertigo    Past Surgical History:  Procedure Laterality Date  . COLON RESECTION  50 years ago  . LUMBAR LAMINECTOMY/DECOMPRESSION MICRODISCECTOMY  06/25/2011   Procedure: LUMBAR LAMINECTOMY/DECOMPRESSION MICRODISCECTOMY;  Surgeon: Hosie Spangle, MD;  Location: [facility];  Service: Neurosurgery;  Laterality: Right;  RIGHT Lumbar Two-Three hemilaminectomy and microdiskectomy  . TONSILLECTOMY    . TOTAL ABDOMINAL HYSTERECTOMY W/ BILATERAL SALPINGOOPHORECTOMY  04/17/2012  . UTERINE FIBROID SURGERY     Family History  Problem Relation Age of Onset  . Dementia Mother   . Stroke Father   . Pancreatic cancer Brother   . Anesthesia problems Neg Hx   . Colon cancer Neg Hx   . Esophageal cancer Neg Hx   . Stomach cancer Neg Hx   . Diabetes Neg Hx   . Kidney disease Neg Hx   . Liver disease Neg Hx    Social History   Socioeconomic History  . Marital status: Widowed    Spouse name: Not on file  . Number of children: 2  . Years of education: Not on file  . Highest education level: Not on file  Occupational History  . Occupation: Retired Human resources officer  Social Needs  . Financial resource strain: Not hard at all  . Food insecurity:    Worry: Never true    Inability: Never true  . Transportation needs:    Medical: No    Non-medical: No  Tobacco Use  . Smoking status: Never Smoker  . Smokeless tobacco: Never Used  Substance and Sexual Activity  . Alcohol use: Yes    Alcohol/week: 0.0 oz    Comment: rarely  . Drug use: No  . Sexual activity: Never  Lifestyle  . Physical activity:    Days per week: 0 days    Minutes per session: 0 min  . Stress: Only a little  Relationships  . Social connections:    Talks on phone: Three times a week    Gets together: Once a week    Attends religious service: Never    Active member of club or organization: No    Attends meetings of clubs or organizations: Never     Relationship status: Widowed  Other Topics Concern  . Not on file  Social History Narrative   Admitted to [facility]   Never smoked   Alcohol rare   DNR    Outpatient Encounter Medications as of 11/09/2017  Medication Sig  . acetaminophen (TYLENOL) 325 MG tablet Take 325 mg by mouth every 6 (six) hours as needed. Take 2 tablets every 6 hours as needed. Notify doctor if not relieve.  Not ex exceed 3000 mg in 24 hour period.  . Amino Acids-Protein Hydrolys (FEEDING SUPPLEMENT, PRO-STAT SUGAR FREE 64,) LIQD Take 30 mLs by mouth 2 (two) times daily between meals. support weight status  . Cholecalciferol (VITAMIN D3) 50000 units CAPS Take 1 capsule by mouth every 30 (thirty) days.  . ferrous sulfate 325 (65 FE) MG tablet Take 325 mg by mouth 2 (two) times daily.   Marland Kitchen lubiprostone (AMITIZA) 24 MCG capsule Take 24 mcg by mouth 2 (two) times daily with a meal.   . Melatonin 3 MG TABS Take 1 tablet by mouth at bedtime.  . mirabegron ER (MYRBETRIQ) 50 MG TB24 tablet Take 50 mg by mouth daily. bladder  . mirtazapine (REMERON) 15 MG tablet Take 15 mg by mouth at bedtime.   Marland Kitchen morphine (MS CONTIN) 15 MG 12 hr tablet Take 1 tablet (15 mg total) by mouth 2 (two) times daily.  . Multiple Vitamin (MULTIVITAMIN) tablet Take 1 tablet by mouth daily.  . ondansetron (ZOFRAN) 4 MG tablet Take 4 mg by mouth every 12 (twelve) hours as needed for nausea or vomiting.  Marland Kitchen oxyCODONE (OXY IR/ROXICODONE) 5 MG immediate release tablet Take 1 tablet (5 mg total) by mouth daily. At 12 pm  . pantoprazole (PROTONIX) 20 MG tablet Take 20 mg by mouth daily.  . potassium chloride (K-DUR) 10 MEQ tablet Take 20 mEq by mouth daily. 2 tablets   . Propylene Glycol (SYSTANE BALANCE) 0.6 % SOLN Place 1 drop into both eyes 2 (two) times daily.  Marland Kitchen rOPINIRole (REQUIP) 4 MG tablet Take 4 mg by mouth at bedtime.  . torsemide (DEMADEX) 20 MG tablet Take 40 mg by mouth every Tuesday, Thursday, Saturday, and Sunday.     . torsemide (DEMADEX) 20 MG tablet Take 60 mg by mouth every Monday, Wednesday, and Friday. 3 tabs   No facility-administered encounter medications on file as of 11/09/2017.     Activities of Daily Living In your present state of health, do you have any difficulty performing the following activities: 11/09/2017  Hearing? N  Vision? N  Difficulty concentrating or making decisions? N  Walking or climbing stairs? Y  Dressing or bathing? Y  Doing errands, shopping? Y  Preparing Food and eating ? Y  Using the Toilet? Y  In the past six months, have you accidently leaked urine? Y  Do you have problems with loss of bowel control? N  Managing your Medications? Y  Managing your Finances? Y  Housekeeping or managing your Housekeeping? Y  Some recent data might be hidden    Patient Care Team: [name], MD as PCP - General (Internal Medicine) [name], MD as Consulting Physician (Orthopedic Surgery) [name], MD as Consulting Physician (Otolaryngology) Mast, Man X, NP as Nurse Practitioner (Internal Medicine)    Assessment:   This is a routine wellness examination for [facility].  Exercise Activities and Dietary recommendations Current Exercise Habits: The patient does not participate in regular exercise at present, Exercise limited by: orthopedic condition(s)  Goals    None      Fall Risk Fall Risk  11/09/2017 11/07/2016 06/30/2013 01/04/2013  Falls in the past year? No No Yes No  Risk for fall due to : - - Impaired balance/gait;Impaired mobility;Medication side effect -   Is the patient's home free of loose throw rugs in walkways, pet beds, electrical cords, etc?   yes      Grab bars in the bathroom? yes  Handrails on the stairs?   yes      Adequate lighting?   yes   Depression Screen PHQ 2/9 Scores 11/09/2017 11/07/2016 06/30/2013  PHQ - 2 Score 0 0 2  PHQ- 9 Score - - 6     Cognitive Function     6CIT Screen 11/09/2017 11/07/2016  What Year? 0  points 4 points  What month? 0 points 0 points  What time? 0 points 0 points  Count back from 20 0 points 0 points  Months in reverse 4 points 2 points  Repeat phrase 10 points 10 points  Total Score 14 16    Immunization History  Administered Date(s) Administered  . Influenza Split 04/15/2012  . Influenza,inj,Quad PF,6+ Mos 03/06/2014  . Influenza-Unspecified 03/01/2016, 05/01/2017  . PPD Test 08/30/2014, 09/18/2014  . Pneumococcal Conjugate-13 09/25/2017  . Pneumococcal Polysaccharide-23 08/29/2013    Qualifies for Shingles Vaccine? Not in past records  Screening Tests Health Maintenance  Topic Date Due  . DEXA SCAN  05/27/2023 (Originally 06/09/1983)  . TETANUS/TDAP  08/24/2023 (Originally 06/10/1937)  . INFLUENZA VACCINE  12/24/2017  . PNA vac Low Risk Adult  Completed    Cancer Screenings: Lung: Low Dose CT Chest recommended if Age 4-80 years, 30 pack-year currently smoking OR have quit w/in 15years. Patient does not qualify. Breast:  Up to date on Mammogram? Yes   Up to date of Bone Density/Dexa? No, ordered Colorectal: up to date  Additional Screenings:  Hepatitis C Screening: declined TDAP due: ordered    Plan:    I have personally reviewed and addressed the Medicare Annual Wellness questionnaire and have noted the following in the patient's chart:  A. Medical and social history B. Use of alcohol, tobacco or illicit drugs  C. Current medications and supplements D. Functional ability and status E.  Nutritional status F.  Physical activity G. Advance directives H. List of other physicians I.  Hospitalizations, surgeries, and ER visits in previous 12 months J.  [place] to include hearing, vision, cognitive, depression L. Referrals and appointments - none  In addition, I have reviewed and discussed with patient certain preventive protocols, quality metrics, and best practice recommendations. A written personalized care plan for preventive  services as well as general preventive health recommendations were provided to patient.  See attached scanned questionnaire for additional information.   Signed,   Tyson Dense, RN Nurse Health Advisor  Patient Concerns: buttock pain and needs someone to walk with her more often

## 2017-11-14 ENCOUNTER — Encounter: Payer: Self-pay | Admitting: Internal Medicine

## 2017-11-14 NOTE — Assessment & Plan Note (Signed)
Stable; pain regimen is controlled by patient's pain clinic; continue MS 15 mg twice daily and oxycodone 5 mg daily

## 2017-11-14 NOTE — Assessment & Plan Note (Signed)
Is controlled; continue Demadex 60 mg Monday Wednesday Friday and 40 mg all other days

## 2017-11-14 NOTE — Assessment & Plan Note (Signed)
Patient with necrosis of hip, before chronic pain; pain regimen is controlled by pain clinic; continue MS Contin 15 mg twice daily oxycodone 5 mg daily

## 2017-11-15 ENCOUNTER — Encounter: Payer: Self-pay | Admitting: Internal Medicine

## 2017-11-15 NOTE — Assessment & Plan Note (Signed)
:   Chronic; continue Amitiza 24 mcg p.o. twice daily

## 2017-11-15 NOTE — Assessment & Plan Note (Signed)
Globin stable; continue iron 325 mg twice daily

## 2017-11-15 NOTE — Assessment & Plan Note (Signed)
Chronic; treated with MS Contin 15 mg twice daily and oxycodone 5 mg 1 daily with moderate success; patient is followed by pain clinic

## 2017-12-03 ENCOUNTER — Encounter: Payer: Self-pay | Admitting: Internal Medicine

## 2017-12-03 ENCOUNTER — Non-Acute Institutional Stay (SKILLED_NURSING_FACILITY): Payer: Medicare Other | Admitting: Internal Medicine

## 2017-12-03 DIAGNOSIS — M48062 Spinal stenosis, lumbar region with neurogenic claudication: Secondary | ICD-10-CM | POA: Diagnosis not present

## 2017-12-03 DIAGNOSIS — G2581 Restless legs syndrome: Secondary | ICD-10-CM

## 2017-12-03 DIAGNOSIS — F1120 Opioid dependence, uncomplicated: Secondary | ICD-10-CM

## 2017-12-03 NOTE — Progress Notes (Signed)
:  Location:  [facility] Room Number: 303D Place of Service:  SNF (31)  Shelly Martin. [name], MD  Patient Care Team: [name], MD as PCP - General (Internal Medicine) [name], MD as Consulting Physician (Orthopedic Surgery) [name], MD as Consulting Physician (Otolaryngology) Mast, Man X, NP as Nurse Practitioner (Internal Medicine)  Extended Emergency Contact Information Primary Emergency Contact: [name] Address: [place], [place] [address] [name] of [place] Phone: [phone] Relation: Daughter Secondary Emergency Contact: [place], [place] Mobile Phone: [phone] Relation: Son     Allergies: Amoxicillin; Baclofen; Hctz [hydrochlorothiazide]; and Valium [diazepam]  Chief Complaint  Patient presents with  . Medical Management of Chronic Issues    Routine Visit    HPI: Patient is 84 y.o. female who is being seen for routine issues of restless leg syndrome lumbar spinal stenosis and narcotic addiction.  Past Medical History:  Diagnosis Date  . Abnormal stress test 08/26/2013  . Acute encephalopathy   . AKI (acute kidney injury) ([place])   . Altered mental state   . Anemia   . Anxiety disorder, unspecified   . Aortic stenosis   . Arthritis    Back, knees  . Avascular necrosis of bone of right hip ([place]) 09/27/2013  . Bowel incontinence   . BPPV (benign paroxysmal positional vertigo) 09/07/2013  . Closed fracture of pubic ramus ([place]) 09/07/2013  . Colon polyps   . Constipation   . Decreased hearing of both ears 08/19/2016   Last Assessment & Plan:  Concern over hearing loss. Slowly progressive hearing loss.  No history of ear surgery, trauma or infection. EXAMINATION shows normal external canals and tympanic membranes. AUDIOGRAM severe sensorineural hearing loss.  Symmetric in nature. PLAN: Reassured I do not see anything bad going  on.  She is a candidate for hearing aids.   will check her benefits.  . Gastric ulcer    years ago  . History of anemia 04/17/2012  . History of TIAs 04/17/2012   June 2011   . Hypertension   . Hypokalemia   . Pelvic fracture ([place]) 08/28/2013  . Personal history of colonic polyps 10/08/2011   Index polypectomy 2013   . Restless legs syndrome    on Requip  . S/P total hysterectomy and bilateral salpingo-oophorectomy 04/17/2012  . Situational stress   . Spinal stenosis   . Spinal stenosis, lumbar 04/17/2012  . Spinal stenosis, lumbar region, with neurogenic claudication 09/07/2013  . Stroke ([place])    Mini, no residual  . TIA (transient ischemic attack)   . Ulcer causing bleeding and hole in wall of stomach or small intestine   . Urinary bladder calculus   . Urinary incontinence   . Vertigo     Past Surgical History:  Procedure Laterality Date  . COLON RESECTION  50 years ago  . LUMBAR LAMINECTOMY/DECOMPRESSION MICRODISCECTOMY  06/25/2011   Procedure: LUMBAR LAMINECTOMY/DECOMPRESSION MICRODISCECTOMY;  Surgeon: Hosie Spangle, MD;  Location: [facility];  Service: Neurosurgery;  Laterality: Right;  RIGHT Lumbar Two-Three hemilaminectomy and microdiskectomy  . TONSILLECTOMY    . TOTAL ABDOMINAL HYSTERECTOMY W/ BILATERAL SALPINGOOPHORECTOMY  04/17/2012  . UTERINE FIBROID SURGERY      Allergies as of 12/03/2017      Reactions   Amoxicillin Other (See Comments)   Reaction unknown  Baclofen Other (See Comments)   "fuzzy in the eyes" and dizzy   Hctz [hydrochlorothiazide]    nausea   Valium [diazepam] Other (See Comments)   Pt became unresponsive and O2 sats dropped.      Medication List        Accurate as of 12/03/17 11:59 PM. Always use your most recent med list.          ferrous sulfate 325 (65 FE) MG tablet Take 325 mg by mouth 2 (two) times daily.   lubiprostone 24 MCG capsule Commonly known as:  AMITIZA Take 24 mcg by mouth 2 (two) times daily with a meal.     Melatonin 3 MG Tabs Take 1 tablet by mouth at bedtime.   mirtazapine 15 MG tablet Commonly known as:  REMERON Take 15 mg by mouth at bedtime.   morphine 15 MG 12 hr tablet Commonly known as:  MS CONTIN Take 1 tablet (15 mg total) by mouth 2 (two) times daily.   multivitamin tablet Take 1 tablet by mouth daily.   MYRBETRIQ 50 MG Tb24 tablet Generic drug:  mirabegron ER Take 50 mg by mouth daily. bladder   ondansetron 4 MG tablet Commonly known as:  ZOFRAN Take 4 mg by mouth every 12 (twelve) hours as needed for nausea or vomiting.   oxyCODONE 5 MG immediate release tablet Commonly known as:  Oxy IR/ROXICODONE Take 1 tablet (5 mg total) by mouth daily. At 12 pm   potassium chloride 10 MEQ tablet Commonly known as:  K-DUR Take 20 mEq by mouth daily. 2 tablets   PROTONIX 40 MG tablet Generic drug:  pantoprazole Take 40 mg by mouth daily.   rOPINIRole 4 MG tablet Commonly known as:  REQUIP Take 4 mg by mouth at bedtime.   SYSTANE BALANCE 0.6 % Soln Generic drug:  Propylene Glycol Place 1 drop into both eyes 2 (two) times daily.   torsemide 20 MG tablet Commonly known as:  DEMADEX Take 40 mg by mouth every Tuesday, Thursday, Saturday, and Sunday.   torsemide 20 MG tablet Commonly known as:  DEMADEX Take 60 mg by mouth every Monday, Wednesday, and Friday. 3 tabs   Vitamin D3 50000 units Caps Take 1 capsule by mouth every 30 (thirty) days.       No orders of the defined types were placed in this encounter.   Immunization History  Administered Date(s) Administered  . Influenza Split 04/15/2012  . Influenza,inj,Quad PF,6+ Mos 03/06/2014  . Influenza-Unspecified 03/01/2016, 05/01/2017  . PPD Test 08/30/2014, 09/18/2014  . Pneumococcal Conjugate-13 09/25/2017  . Pneumococcal Polysaccharide-23 08/29/2013    Social History   Tobacco Use  . Smoking status: Never Smoker  . Smokeless tobacco: Never Used  Substance Use Topics  . Alcohol use: Yes     Alcohol/week: 0.0 oz    Comment: rarely    Family history is   Family History  Problem Relation Age of Onset  . Dementia Mother   . Stroke Father   . Pancreatic cancer Brother   . Anesthesia problems Neg Hx   . Colon cancer Neg Hx   . Esophageal cancer Neg Hx   . Stomach cancer Neg Hx   . Diabetes Neg Hx   . Kidney disease Neg Hx   . Liver disease Neg Hx       Review of Systems  DATA OBTAINED: from patient-limited; nursing no acute concerns GENERAL:  no fevers, fatigue, appetite changes SKIN: No itching, or rash EYES: No eye pain,  redness, discharge EARS: No earache, tinnitus, change in hearing NOSE: No congestion, drainage or bleeding  MOUTH/THROAT: No mouth or tooth pain, No sore throat RESPIRATORY: No cough, wheezing, SOB CARDIAC: No chest pain, palpitations, lower extremity edema  GI: No abdominal pain, No N/V/D or constipation, No heartburn or reflux  GU: No dysuria, frequency or urgency, or incontinence  MUSCULOSKELETAL: No unrelieved bone/joint pain NEUROLOGIC: No headache, dizziness or focal weakness PSYCHIATRIC: No c/o anxiety or sadness   Vitals:   12/03/17 1034  BP: 99/61  Pulse: 79  Resp: 18  Temp: 98.2 F (36.8 C)    SpO2 Readings from Last 1 Encounters:  11/09/17 95%   Body mass index is 18.08 kg/m.     Physical Exam  GENERAL APPEARANCE: Alert, conversant,  No acute distress.  SKIN: No diaphoresis rash HEAD: Normocephalic, atraumatic  EYES: Conjunctiva/lids clear. Pupils round, reactive. EOMs intact.  EARS: External exam WNL, canals clear. Hearing grossly normal.  NOSE: No deformity or discharge.  MOUTH/THROAT: Lips w/o lesions  RESPIRATORY: Breathing is even, unlabored. Lung sounds are clear   CARDIOVASCULAR: Heart RRR no murmurs, rubs or gallops. + peripheral edema, chronic and at baseline GASTROINTESTINAL: Abdomen is soft, non-tender, not distended w/ normal bowel sounds. GENITOURINARY: Bladder non tender, not distended    MUSCULOSKELETAL: No abnormal joints or musculature NEUROLOGIC:  Cranial nerves 2-12 grossly intact. Moves all extremities  PSYCHIATRIC: Mood and affect appropriate with dementia that is slowly progressing, no behavioral issues  Patient Active Problem List   Diagnosis Date Noted  . Anxiety disorder, unspecified 07/23/2017  . Iron deficiency anemia 03/29/2017  . GERD (gastroesophageal reflux disease) 12/22/2016  . Decreased hearing of both ears 08/19/2016  . Pressure injury of skin 08/15/2016  . Aspiration pneumonia ([place]) 08/14/2016  . Chronic diastolic CHF (congestive heart failure) ([place]) 08/14/2016  . Chronic right shoulder pain 02/09/2016  . Bilateral lower extremity edema 02/02/2016  . Ileitis 01/19/2016  . Pressure ulcer 01/09/2016  . SBO (small bowel obstruction) ([place]) 01/08/2016  . Narcotic addiction ([place]) 05/15/2015  . Diarrhea 03/30/2015  . Acute pharyngitis 02/26/2015  . Osteoarthritis of right knee 02/22/2015  . Pain in joint, lower leg 10/31/2014  . Ulcer of sacral region, stage 4 ([place]) 04/15/2014  . Moderate aortic stenosis 04/14/2014  . Pre-syncope 04/14/2014  . Hypotension 04/14/2014  . Acute renal failure superimposed on stage 2 chronic kidney disease ([place]) 04/14/2014  . Anemia 04/14/2014  . Protein-calorie malnutrition, severe ([place]) 04/14/2014  . Altered mental status   . Dehydration 04/13/2014  . Altered mental state 04/13/2014  . Acute kidney injury ([place])   . Hypokalemia 03/25/2014  . Syncope 03/16/2014  . Acute encephalopathy 03/16/2014  . Leg swelling 03/02/2014  . Avascular necrosis of bone of right hip ([place]) 09/27/2013  . Primary osteoarthritis of right hip 09/07/2013  . Spinal stenosis, lumbar region, with neurogenic claudication 09/07/2013  . Closed fracture of pubic ramus ([place]) 09/07/2013  . BPPV (benign paroxysmal positional vertigo) 09/07/2013  . Pharyngeal swelling 08/30/2013  . Pelvic fracture ([place]) 08/28/2013  . Fall 08/26/2013  . Dizziness  08/26/2013  . Abnormal stress test 08/26/2013  . Preop cardiovascular exam 08/01/2013  . Aortic stenosis 08/01/2013  . Edema 06/30/2013  . Loss of weight 01/04/2013  . Situational stress 01/04/2013  . Chronic right hip pain 07/05/2012  . Knee pain, acute 06/29/2012  . Vertigo 06/24/2012  . Acute exacerbation of chronic low back pain 06/24/2012  . Lumbar pain with radiation down right leg 06/09/2012  .  Osteoarthritis of hip 04/29/2012  . History of TIAs 04/17/2012  . S/P total hysterectomy and bilateral salpingo-oophorectomy 04/17/2012  . Hard of hearing 04/17/2012  . History of anemia 04/17/2012  . RLS (restless legs syndrome) 04/17/2012  . Chronic back pain 04/17/2012  . Spinal stenosis, lumbar 04/17/2012  . Lower back pain 12/20/2011  . Personal history of colonic polyps 10/08/2011  . H/O 09/14/2011  . Essential hypertension 09/14/2011  . Restless legs syndrome 09/14/2011  . Constipation 08/20/2011  . Urinary incontinence 08/20/2011      Labs reviewed: Basic Metabolic Panel:    Component Value Date/Time   NA 142 08/17/2016 0343   NA 142 08/17/2016   K 3.9 08/17/2016 0343   CL 106 08/17/2016 0343   CO2 28 08/17/2016 0343   GLUCOSE 92 08/17/2016 0343   BUN 22 (H) 08/17/2016 0343   BUN 22 (A) 08/17/2016   CREATININE 1.13 (H) 08/17/2016 0343   CREATININE 0.78 06/28/2014 1205   CALCIUM 7.8 (L) 08/17/2016 0343   PROT 6.6 08/14/2016 1519   ALBUMIN 3.4 (L) 08/14/2016 1519   AST 28 08/14/2016 1519   ALT 17 08/14/2016 1519   ALKPHOS 107 08/14/2016 1519   BILITOT 1.1 08/14/2016 1519   GFRNONAA 40 (L) 08/17/2016 0343   GFRAA 46 (L) 08/17/2016 0343    No results for input(s): NA, K, CL, CO2, GLUCOSE, BUN, CREATININE, CALCIUM, MG, PHOS in the last 8760 hours. Liver Function Tests: No results for input(s): AST, ALT, ALKPHOS, BILITOT, PROT, ALBUMIN in the last 8760 hours. No results for input(s): LIPASE, AMYLASE in the last 8760 hours. No results for input(s): AMMONIA in  the last 8760 hours. CBC: Recent Labs    12/22/16 12/23/16 04/20/17  WBC 6.5 6.6 7.9  HGB 7.5* 7.5* 9.6*  HCT 24* 24* 31*  PLT 287 273 295   Lipid No results for input(s): CHOL, HDL, LDLCALC, TRIG in the last 8760 hours.  Cardiac Enzymes: No results for input(s): CKTOTAL, CKMB, CKMBINDEX, TROPONINI in the last 8760 hours. BNP: No results for input(s): BNP in the last 8760 hours. No results found for: [facility] Lab Results  Component Value Date   HGBA1C 5.6 03/19/2016   Lab Results  Component Value Date   TSH 2.46 04/26/2016   Lab Results  Component Value Date   VITAMINB12 575 04/20/2017   Lab Results  Component Value Date   FOLATE 17.9 06/28/2014   Lab Results  Component Value Date   IRON 20 (L) 06/28/2014   TIBC 272 06/28/2014   FERRITIN 47 06/28/2014    Imaging and Procedures obtained prior to SNF admission: Ct Abdomen Pelvis Wo Contrast  Result Date: 08/14/2016 CLINICAL DATA:  Nausea and vomiting. Abdominal pain after starting Phenergan. EXAM: CT ABDOMEN AND PELVIS WITHOUT CONTRAST TECHNIQUE: Multidetector CT imaging of the abdomen and pelvis was performed following the standard protocol without IV contrast. COMPARISON:  01/08/2016. FINDINGS: Lower chest: Patchy airspace opacity in the right middle lobe and right lower lobe. Mild left lower lobe atelectasis. No pleural fluid. Hepatobiliary: Mildly distended gallbladder containing sludge and a 9 mm gallstone. No gallbladder wall thickening or pericholecystic fluid seen. Unremarkable liver. Pancreas: Unremarkable. No pancreatic ductal dilatation or surrounding inflammatory changes. Spleen: Normal in size without focal abnormality. Adrenals/Urinary Tract: 2 tiny lower pole left renal calculi. Interval minimally dilated ureters and collecting systems. Distended urinary bladder. No urinary tract calculi seen. Stomach/Bowel: Progressive dilatation of the stomach and similar degree of dilatation of the proximal small bowel.  Normal caliber distal  small bowel with some fecalized bowel contents. No pneumatosis or wall thickening. No well-defined transition point. Inferior displacement of the rectum. Prominent stool throughout normal caliber colon. No evidence of appendicitis. Vascular/Lymphatic: Atheromatous arterial calcifications, including the abdominal aorta and dense coronary artery calcifications. No enlarged lymph nodes. Reproductive: Surgically absent uterus.  No adnexal masses. Other: Small amount of free peritoneal fluid. Mild diffuse subcutaneous edema. Musculoskeletal: Marked right hip degenerative changes with joint space narrowing and subarticular cyst formation. Lumbar and lower thoracic spine degenerative changes. IMPRESSION: 1. Recurrent partial small bowel obstruction. The cause of obstruction is not identified. Reactive ileus due to right lung pneumonia is also a possibility. 2. Patchy airspace opacity in the right middle lobe and right lower lobe suspicious for pneumonia. 3. Rectocele. 4. 2 tiny, nonobstructing lower pole left renal calculi. 5. Interval minimal bilateral hydronephrosis, possibly mechanical due to mild distention of the urinary bladder. 6. Cholelithiasis and sludge in the gallbladder without evidence of cholecystitis. 7. Densely calcified coronary artery and aortic atherosclerosis. Electronically Signed   By: Claudie Revering M.D.   On: 08/14/2016 18:22   Dg Abd 2 Views  Result Date: 08/15/2016 CLINICAL DATA:  Check nasogastric catheter placement EXAM: ABDOMEN - 2 VIEW COMPARISON:  08/14/2016 FINDINGS: Nasogastric catheter is noted coiled within the fundus of the stomach stable from the prior exam. Scattered large and small bowel gas is again seen. The degree of small bowel dilatation has improved somewhat in the interval from the prior exam. Fecal material is noted throughout the colon. Degenerative changes of the lumbar spine and hip joints are noted. IMPRESSION: Nasogastric catheter within the  stomach. The degree of small bowel dilatation has improved in the interval. Electronically Signed   By: Inez Catalina M.D.   On: 08/15/2016 09:10   Dg Abd Portable 1v  Result Date: 08/14/2016 CLINICAL DATA:  NG tube placement EXAM: PORTABLE ABDOMEN - 1 VIEW COMPARISON:  08/14/2016 FINDINGS: Esophageal tube tip is folded upon itself in the gastric fundus. Multiple dilated loops of small bowel up to 5.7 cm, consistent with small bowel obstruction. IMPRESSION: Esophageal tube tip overlies the proximal stomach. Electronically Signed   By: Donavan Foil M.D.   On: 08/14/2016 20:49     Not all labs, radiology exams or other studies done during hospitalization come through on my EPIC note; however they are reviewed by me.    Assessment and Plan  RLS (restless legs syndrome) No problems on Requip 4 mg nightly: Continue current therapy  Spinal stenosis, lumbar Patient continues without complaints with pain regimen for her chronic back painWhich is MS Contin 15 mg twice daily and oxycodone 5 mg once daily  Narcotic addiction ([place]) Patient with aseptic necrosis of the right hip; this is chronic and 1 of the reasons why patient is addicted and will be for life; pain clinic has done a really good job of decreasing her pain meds   Anne D. [name], MD

## 2017-12-10 NOTE — Progress Notes (Signed)
OPENED IN ERROR

## 2017-12-12 ENCOUNTER — Encounter: Payer: Self-pay | Admitting: Internal Medicine

## 2017-12-12 NOTE — Assessment & Plan Note (Signed)
Patient with aseptic necrosis of the right hip; this is chronic and 1 of the reasons why patient is addicted and will be for life; pain clinic has done a really good job of decreasing her pain meds

## 2017-12-12 NOTE — Assessment & Plan Note (Signed)
No problems on Requip 4 mg nightly: Continue current therapy

## 2017-12-12 NOTE — Assessment & Plan Note (Signed)
Patient continues without complaints with pain regimen for her chronic back painWhich is MS Contin 15 mg twice daily and oxycodone 5 mg once daily

## 2018-01-08 ENCOUNTER — Encounter: Payer: Self-pay | Admitting: Internal Medicine

## 2018-01-08 ENCOUNTER — Non-Acute Institutional Stay (SKILLED_NURSING_FACILITY): Payer: Medicare Other | Admitting: Internal Medicine

## 2018-01-08 DIAGNOSIS — K2100 Gastro-esophageal reflux disease with esophagitis, without bleeding: Secondary | ICD-10-CM

## 2018-01-08 DIAGNOSIS — I5032 Chronic diastolic (congestive) heart failure: Secondary | ICD-10-CM | POA: Diagnosis not present

## 2018-01-08 DIAGNOSIS — R600 Localized edema: Secondary | ICD-10-CM

## 2018-01-08 NOTE — Progress Notes (Signed)
Location:  [facility] Room Number: 303D Place of Service:  SNF [phone])  [name], MD  Patient Care Team: [name], MD as PCP - General (Internal Medicine) [name], MD as Consulting Physician (Orthopedic Surgery) [name], MD as Consulting Physician (Otolaryngology) Mast, Man X, NP as Nurse Practitioner (Internal Medicine)  Extended Emergency Contact Information Primary Emergency Contact: [name] Address: [place], [place] [address] [name] of [place] Phone: [phone] Relation: Daughter Secondary Emergency Contact: [place], [place] of [place] Mobile Phone: [phone] Relation: Son    Allergies: Amoxicillin; Baclofen; Hctz [hydrochlorothiazide]; and Valium [diazepam]  Chief Complaint  Patient presents with  . Medical Management of Chronic Issues    Routine Visit    HPI: Patient is 84 y.o. female who is being seen for routine issues of bilateral lower extremity edema, GERD, and chronic diastolic congestive heart failure.  Past Medical History:  Diagnosis Date  . Abnormal stress test 08/26/2013  . Acute encephalopathy   . AKI (acute kidney injury) ([place])   . Altered mental state   . Anemia   . Anxiety disorder, unspecified   . Aortic stenosis   . Arthritis    Back, knees  . Avascular necrosis of bone of right hip ([place]) 09/27/2013  . Bowel incontinence   . BPPV (benign paroxysmal positional vertigo) 09/07/2013  . Closed fracture of pubic ramus ([place]) 09/07/2013  . Colon polyps   . Constipation   . Decreased hearing of both ears 08/19/2016   Last Assessment & Plan:  Concern over hearing loss. Slowly progressive hearing loss.  No history of ear surgery, trauma or infection. EXAMINATION shows normal external canals and tympanic membranes. AUDIOGRAM severe sensorineural hearing loss.  Symmetric in nature. PLAN: Reassured I do not see  anything bad going on.  She is a candidate for hearing aids.   will check her benefits.  . Gastric ulcer    years ago  . History of anemia 04/17/2012  . History of TIAs 04/17/2012   June 2011   . Hypertension   . Hypokalemia   . Pelvic fracture ([place]) 08/28/2013  . Personal history of colonic polyps 10/08/2011   Index polypectomy 2013   . Restless legs syndrome    on Requip  . S/P total hysterectomy and bilateral salpingo-oophorectomy 04/17/2012  . Situational stress   . Spinal stenosis   . Spinal stenosis, lumbar 04/17/2012  . Spinal stenosis, lumbar region, with neurogenic claudication 09/07/2013  . Stroke ([place])    Mini, no residual  . TIA (transient ischemic attack)   . Ulcer causing bleeding and hole in wall of stomach or small intestine   . Urinary bladder calculus   . Urinary incontinence   . Vertigo     Past Surgical History:  Procedure Laterality Date  . COLON RESECTION  50 years ago  . LUMBAR LAMINECTOMY/DECOMPRESSION MICRODISCECTOMY  06/25/2011   Procedure: LUMBAR LAMINECTOMY/DECOMPRESSION MICRODISCECTOMY;  Surgeon: Hosie Spangle, MD;  Location: [facility];  Service: Neurosurgery;  Laterality: Right;  RIGHT Lumbar Two-Three hemilaminectomy and microdiskectomy  . TONSILLECTOMY    . TOTAL ABDOMINAL HYSTERECTOMY W/ BILATERAL SALPINGOOPHORECTOMY  04/17/2012  . UTERINE FIBROID SURGERY      Allergies as of 01/08/2018      Reactions   Amoxicillin Other (See Comments)   Reaction unknown  Baclofen Other (See Comments)   "fuzzy in the eyes" and dizzy   Hctz [hydrochlorothiazide]    nausea   Valium [diazepam] Other (See Comments)   Pt became unresponsive and O2 sats dropped.      Medication List        Accurate as of 01/08/18 11:59 PM. Always use your most recent med list.          ferrous sulfate 325 (65 FE) MG tablet Take 325 mg by mouth 2 (two) times daily.   lubiprostone 24 MCG capsule Commonly known as:  AMITIZA Take 24 mcg by mouth 2 (two) times daily  with a meal.   Melatonin 3 MG Tabs Take 1 tablet by mouth at bedtime.   mirtazapine 15 MG tablet Commonly known as:  REMERON Take 15 mg by mouth at bedtime.   morphine 15 MG 12 hr tablet Commonly known as:  MS CONTIN Take 1 tablet (15 mg total) by mouth 2 (two) times daily.   multivitamin tablet Take 1 tablet by mouth daily.   MYRBETRIQ 50 MG Tb24 tablet Generic drug:  mirabegron ER Take 50 mg by mouth daily. bladder   ondansetron 4 MG tablet Commonly known as:  ZOFRAN Take 4 mg by mouth every 12 (twelve) hours as needed for nausea or vomiting.   oxyCODONE 5 MG immediate release tablet Commonly known as:  Oxy IR/ROXICODONE Take 1 tablet (5 mg total) by mouth daily. At 12 pm   potassium chloride 10 MEQ tablet Commonly known as:  K-DUR Take 20 mEq by mouth daily. 2 tablets   PROTONIX 40 MG tablet Generic drug:  pantoprazole Take 40 mg by mouth daily.   rOPINIRole 4 MG tablet Commonly known as:  REQUIP Take 4 mg by mouth at bedtime.   SYSTANE BALANCE 0.6 % Soln Generic drug:  Propylene Glycol Place 1 drop into both eyes 2 (two) times daily.   torsemide 20 MG tablet Commonly known as:  DEMADEX Take 40 mg by mouth every Tuesday, Thursday, Saturday, and Sunday.   torsemide 20 MG tablet Commonly known as:  DEMADEX Take 60 mg by mouth every Monday, Wednesday, and Friday. 3 tabs   Vitamin D3 50000 units Caps Take 1 capsule by mouth every 30 (thirty) days.       No orders of the defined types were placed in this encounter.   Immunization History  Administered Date(s) Administered  . Influenza Split 04/15/2012  . Influenza,inj,Quad PF,6+ Mos 03/06/2014  . Influenza-Unspecified 03/01/2016, 05/01/2017  . PPD Test 08/30/2014, 09/18/2014  . Pneumococcal Conjugate-13 09/25/2017  . Pneumococcal Polysaccharide-23 08/29/2013    Social History   Tobacco Use  . Smoking status: Never Smoker  . Smokeless tobacco: Never Used  Substance Use Topics  . Alcohol use:  Yes    Alcohol/week: 0.0 standard drinks    Comment: rarely    Review of Systems  DATA OBTAINED: from patient-reliable history is unlikely; nursing-no acute concerns GENERAL:  no fevers, fatigue, appetite changes SKIN: No itching, rash HEENT: No complaint RESPIRATORY: No cough, wheezing, SOB CARDIAC: No chest pain, palpitations, lower extremity edema  GI: No abdominal pain, No N/V/D or constipation, No heartburn or reflux  GU: No dysuria, frequency or urgency, or incontinence  MUSCULOSKELETAL: No unrelieved bone/joint pain NEUROLOGIC: No headache, dizziness  PSYCHIATRIC: No overt anxiety or sadness  Vitals:   01/08/18 1410  BP: (!) 101/54  Pulse: 70  Resp: 17  Temp: 98 F (36.7 C)   Body mass index is 16.17 kg/m.  Physical Exam  GENERAL APPEARANCE: Alert, conversant, No acute distress  SKIN: No diaphoresis rash HEENT: Unremarkable RESPIRATORY: Breathing is even, unlabored. Lung sounds are clear   CARDIOVASCULAR: Heart RRR no murmurs, rubs or gallops.  1/2+-1 peripheral edema  GASTROINTESTINAL: Abdomen is soft, non-tender, not distended w/ normal bowel sounds.  GENITOURINARY: Bladder non tender, not distended  MUSCULOSKELETAL: No abnormal joints or musculature NEUROLOGIC: Cranial nerves 2-12 grossly intact. Moves all extremities PSYCHIATRIC: Mood and affect appears appropriate but in point of fact patient changes her story every few minutes forgets which she has said, no behavioral issues  Patient Active Problem List   Diagnosis Date Noted  . Anxiety disorder, unspecified 07/23/2017  . Iron deficiency anemia 03/29/2017  . GERD (gastroesophageal reflux disease) 12/22/2016  . Decreased hearing of both ears 08/19/2016  . Pressure injury of skin 08/15/2016  . Aspiration pneumonia ([place]) 08/14/2016  . Chronic diastolic CHF (congestive heart failure) ([place]) 08/14/2016  . Chronic right shoulder pain 02/09/2016  . Bilateral lower extremity edema 02/02/2016  . Ileitis  01/19/2016  . Pressure ulcer 01/09/2016  . SBO (small bowel obstruction) ([place]) 01/08/2016  . Narcotic addiction ([place]) 05/15/2015  . Diarrhea 03/30/2015  . Acute pharyngitis 02/26/2015  . Osteoarthritis of right knee 02/22/2015  . Pain in joint, lower leg 10/31/2014  . Ulcer of sacral region, stage 4 ([place]) 04/15/2014  . Moderate aortic stenosis 04/14/2014  . Pre-syncope 04/14/2014  . Hypotension 04/14/2014  . Acute renal failure superimposed on stage 2 chronic kidney disease ([place]) 04/14/2014  . Anemia 04/14/2014  . Protein-calorie malnutrition, severe ([place]) 04/14/2014  . Altered mental status   . Dehydration 04/13/2014  . Altered mental state 04/13/2014  . Acute kidney injury ([place])   . Hypokalemia 03/25/2014  . Syncope 03/16/2014  . Acute encephalopathy 03/16/2014  . Leg swelling 03/02/2014  . Avascular necrosis of bone of right hip ([place]) 09/27/2013  . Primary osteoarthritis of right hip 09/07/2013  . Spinal stenosis, lumbar region, with neurogenic claudication 09/07/2013  . Closed fracture of pubic ramus ([place]) 09/07/2013  . BPPV (benign paroxysmal positional vertigo) 09/07/2013  . Pharyngeal swelling 08/30/2013  . Pelvic fracture ([place]) 08/28/2013  . Fall 08/26/2013  . Dizziness 08/26/2013  . Abnormal stress test 08/26/2013  . Preop cardiovascular exam 08/01/2013  . Aortic stenosis 08/01/2013  . Edema 06/30/2013  . Loss of weight 01/04/2013  . Situational stress 01/04/2013  . Chronic right hip pain 07/05/2012  . Knee pain, acute 06/29/2012  . Vertigo 06/24/2012  . Acute exacerbation of chronic low back pain 06/24/2012  . Lumbar pain with radiation down right leg 06/09/2012  . Osteoarthritis of hip 04/29/2012  . History of TIAs 04/17/2012  . S/P total hysterectomy and bilateral salpingo-oophorectomy 04/17/2012  . Hard of hearing 04/17/2012  . History of anemia 04/17/2012  . RLS (restless legs syndrome) 04/17/2012  . Chronic back pain 04/17/2012  . Spinal stenosis,  lumbar 04/17/2012  . Lower back pain 12/20/2011  . Personal history of colonic polyps 10/08/2011  . H/O 09/14/2011  . Essential hypertension 09/14/2011  . Restless legs syndrome 09/14/2011  . Constipation 08/20/2011  . Urinary incontinence 08/20/2011    CMP     Component Value Date/Time   NA 142 08/17/2016 0343   NA 142 08/17/2016   K 3.9 08/17/2016 0343   CL 106 08/17/2016 0343   CO2 28 08/17/2016 0343   GLUCOSE 92 08/17/2016 0343   BUN 22 (H) 08/17/2016 0343   BUN 22 (A) 08/17/2016   CREATININE  1.13 (H) 08/17/2016 0343   CREATININE 0.78 06/28/2014 1205   CALCIUM 7.8 (L) 08/17/2016 0343   PROT 6.6 08/14/2016 1519   ALBUMIN 3.4 (L) 08/14/2016 1519   AST 28 08/14/2016 1519   ALT 17 08/14/2016 1519   ALKPHOS 107 08/14/2016 1519   BILITOT 1.1 08/14/2016 1519   GFRNONAA 40 (L) 08/17/2016 0343   GFRAA 46 (L) 08/17/2016 0343   No results for input(s): NA, K, CL, CO2, GLUCOSE, BUN, CREATININE, CALCIUM, MG, PHOS in the last 8760 hours. No results for input(s): AST, ALT, ALKPHOS, BILITOT, PROT, ALBUMIN in the last 8760 hours. Recent Labs    04/20/17  WBC 7.9  HGB 9.6*  HCT 31*  PLT 295   No results for input(s): CHOL, LDLCALC, TRIG in the last 8760 hours.  Invalid input(s): HCL No results found for: [facility] Lab Results  Component Value Date   TSH 2.46 04/26/2016   Lab Results  Component Value Date   HGBA1C 5.6 03/19/2016   Lab Results  Component Value Date   CHOL 141 03/19/2016   HDL 55 03/19/2016   LDLCALC 71 03/19/2016   TRIG 77 03/19/2016   CHOLHDL 2.8 03/06/2014    Significant Diagnostic Results in last 30 days:  No results found.  Assessment and Plan  Bilateral lower extremity edema Waxes and wanes, stable at this time; continue Demadex 60 mg daily Monday Wednesday Friday and 40 mg every other day.  GERD (gastroesophageal reflux disease) No reported complaints; continue Protonix 40 mg daily  Chronic diastolic CHF (congestive heart failure)  ([place]) Continue stable without exacerbation; continue Demadex 60 mg daily Monday Wednesday Friday and 40 mg daily Tuesday Thursday Saturday and Sunday    Renai Lopata D. [name], MD

## 2018-01-15 ENCOUNTER — Encounter: Payer: Self-pay | Admitting: Internal Medicine

## 2018-01-15 NOTE — Assessment & Plan Note (Signed)
Continue stable without exacerbation; continue Demadex 60 mg daily Monday Wednesday Friday and 40 mg daily Tuesday Thursday Saturday and Sunday

## 2018-01-15 NOTE — Assessment & Plan Note (Signed)
Waxes and wanes, stable at this time; continue Demadex 60 mg daily Monday Wednesday Friday and 40 mg every other day.

## 2018-01-15 NOTE — Assessment & Plan Note (Signed)
No reported complaints; continue Protonix 40 mg daily

## 2018-02-04 ENCOUNTER — Non-Acute Institutional Stay (SKILLED_NURSING_FACILITY): Payer: Medicare Other | Admitting: Internal Medicine

## 2018-02-04 ENCOUNTER — Encounter: Payer: Self-pay | Admitting: Internal Medicine

## 2018-02-04 DIAGNOSIS — M545 Low back pain: Secondary | ICD-10-CM | POA: Diagnosis not present

## 2018-02-04 DIAGNOSIS — K5901 Slow transit constipation: Secondary | ICD-10-CM | POA: Diagnosis not present

## 2018-02-04 DIAGNOSIS — G8929 Other chronic pain: Secondary | ICD-10-CM

## 2018-02-04 DIAGNOSIS — I10 Essential (primary) hypertension: Secondary | ICD-10-CM | POA: Diagnosis not present

## 2018-02-04 NOTE — Progress Notes (Signed)
Location:  [facility] Room Number: 303D Place of Service:  SNF [phone])  Shelly Martin. Shelly Coil, MD  Patient Care Team: [name], MD as PCP - General (Internal Medicine) [name], MD as Consulting Physician (Orthopedic Surgery) [name], MD as Consulting Physician (Otolaryngology) Mast, Man X, NP as Nurse Practitioner (Internal Medicine)  Extended Emergency Contact Information Primary Emergency Contact: [name] Address: [place], [place] [address] [name] of [place] Phone: [phone] Relation: Daughter Secondary Emergency Contact: [place], [place] of [place] Mobile Phone: [phone] Relation: Son    Allergies: Amoxicillin; Baclofen; Hctz [hydrochlorothiazide]; and Valium [diazepam]  Chief Complaint  Patient presents with  . Medical Management of Chronic Issues    Routine Visit    HPI: Patient is 84 y.o. female who is being seen for routine issues of constipation, hypertension, and chronic back pain.  Past Medical History:  Diagnosis Date  . Abnormal stress test 08/26/2013  . Acute encephalopathy   . AKI (acute kidney injury) ([place])   . Altered mental state   . Anemia   . Anxiety disorder, unspecified   . Aortic stenosis   . Arthritis    Back, knees  . Avascular necrosis of bone of right hip ([place]) 09/27/2013  . Bowel incontinence   . BPPV (benign paroxysmal positional vertigo) 09/07/2013  . Closed fracture of pubic ramus ([place]) 09/07/2013  . Colon polyps   . Constipation   . Decreased hearing of both ears 08/19/2016   Last Assessment & Plan:  Concern over hearing loss. Slowly progressive hearing loss.  No history of ear surgery, trauma or infection. EXAMINATION shows normal external canals and tympanic membranes. AUDIOGRAM severe sensorineural hearing loss.  Symmetric in nature. PLAN: Reassured I do not see anything bad going on.  She is a  candidate for hearing aids.   will check her benefits.  . Gastric ulcer    years ago  . History of anemia 04/17/2012  . History of TIAs 04/17/2012   June 2011   . Hypertension   . Hypokalemia   . Pelvic fracture ([place]) 08/28/2013  . Personal history of colonic polyps 10/08/2011   Index polypectomy 2013   . Restless legs syndrome    on Requip  . S/P total hysterectomy and bilateral salpingo-oophorectomy 04/17/2012  . Situational stress   . Spinal stenosis   . Spinal stenosis, lumbar 04/17/2012  . Spinal stenosis, lumbar region, with neurogenic claudication 09/07/2013  . Stroke ([place])    Mini, no residual  . TIA (transient ischemic attack)   . Ulcer causing bleeding and hole in wall of stomach or small intestine   . Urinary bladder calculus   . Urinary incontinence   . Vertigo     Past Surgical History:  Procedure Laterality Date  . COLON RESECTION  50 years ago  . LUMBAR LAMINECTOMY/DECOMPRESSION MICRODISCECTOMY  06/25/2011   Procedure: LUMBAR LAMINECTOMY/DECOMPRESSION MICRODISCECTOMY;  Surgeon: Hosie Spangle, MD;  Location: [facility];  Service: Neurosurgery;  Laterality: Right;  RIGHT Lumbar Two-Three hemilaminectomy and microdiskectomy  . TONSILLECTOMY    . TOTAL ABDOMINAL HYSTERECTOMY W/ BILATERAL SALPINGOOPHORECTOMY  04/17/2012  . UTERINE FIBROID SURGERY      Allergies as of 02/04/2018      Reactions   Amoxicillin Other (See Comments)   Reaction unknown   Baclofen Other (See Comments)   "  fuzzy in the eyes" and dizzy   Hctz [hydrochlorothiazide]    nausea   Valium [diazepam] Other (See Comments)   Pt became unresponsive and O2 sats dropped.      Medication List        Accurate as of 02/04/18 11:59 PM. Always use your most recent med list.          ferrous sulfate 325 (65 FE) MG tablet Take 325 mg by mouth 2 (two) times daily.   lubiprostone 24 MCG capsule Commonly known as:  AMITIZA Take 24 mcg by mouth 2 (two) times daily with a meal.   Melatonin 3 MG  Tabs Take 1 tablet by mouth at bedtime.   mirtazapine 15 MG tablet Commonly known as:  REMERON Take 15 mg by mouth at bedtime.   morphine 15 MG 12 hr tablet Commonly known as:  MS CONTIN Take 1 tablet (15 mg total) by mouth 2 (two) times daily.   multivitamin tablet Take 1 tablet by mouth daily.   MYRBETRIQ 50 MG Tb24 tablet Generic drug:  mirabegron ER Take 50 mg by mouth daily. bladder   ondansetron 4 MG tablet Commonly known as:  ZOFRAN Take 4 mg by mouth every 12 (twelve) hours as needed for nausea or vomiting.   oxyCODONE 5 MG immediate release tablet Commonly known as:  Oxy IR/ROXICODONE Take 1 tablet (5 mg total) by mouth daily. At 12 pm   potassium chloride 10 MEQ tablet Commonly known as:  K-DUR Take 20 mEq by mouth daily. 2 tablets   PROTONIX 40 MG tablet Generic drug:  pantoprazole Take 40 mg by mouth daily.   rOPINIRole 4 MG tablet Commonly known as:  REQUIP Take 4 mg by mouth at bedtime.   SYSTANE BALANCE 0.6 % Soln Generic drug:  Propylene Glycol Place 1 drop into both eyes 2 (two) times daily.   torsemide 20 MG tablet Commonly known as:  DEMADEX Take 40 mg by mouth every Tuesday, Thursday, Saturday, and Sunday.   torsemide 20 MG tablet Commonly known as:  DEMADEX Take 60 mg by mouth every Monday, Wednesday, and Friday. 3 tabs   Vitamin D3 50000 units Caps Take 1 capsule by mouth every 30 (thirty) days.       No orders of the defined types were placed in this encounter.   Immunization History  Administered Date(s) Administered  . Influenza Split 04/15/2012  . Influenza,inj,Quad PF,6+ Mos 03/06/2014  . Influenza-Unspecified 03/01/2016, 05/01/2017  . PPD Test 08/30/2014, 09/18/2014  . Pneumococcal Conjugate-13 09/25/2017  . Pneumococcal Polysaccharide-23 08/29/2013    Social History   Tobacco Use  . Smoking status: Never Smoker  . Smokeless tobacco: Never Used  Substance Use Topics  . Alcohol use: Yes    Alcohol/week: 0.0  standard drinks    Comment: rarely    Review of Systems  DATA OBTAINED: from patient-limited, very limited; nursing-no acute concerns GENERAL:  no fevers, fatigue, appetite changes SKIN: No itching, rash HEENT: No complaint RESPIRATORY: No cough, wheezing, SOB CARDIAC: No chest pain, palpitations, lower extremity edema  GI: No abdominal pain, No N/V/D or constipation, No heartburn or reflux  GU: No dysuria, frequency or urgency, or incontinence  MUSCULOSKELETAL: No unrelieved bone/joint pain NEUROLOGIC: No headache, dizziness  PSYCHIATRIC: No overt anxiety or sadness  Vitals:   02/04/18 1307  BP: (!) 113/57  Pulse: 94  Resp: 18  Temp: (!) 97.5 F (36.4 C)   Body mass index is 15.62 kg/m. Physical Exam  GENERAL APPEARANCE: Alert,  conversant, No acute distress  SKIN: No diaphoresis rash HEENT: Unremarkable RESPIRATORY: Breathing is even, unlabored. Lung sounds are clear   CARDIOVASCULAR: Heart RRR no murmurs, rubs or gallops. No peripheral edema  GASTROINTESTINAL: Abdomen is soft, non-tender, not distended w/ normal bowel sounds.  GENITOURINARY: Bladder non tender, not distended  MUSCULOSKELETAL: No abnormal joints or musculature except very thin NEUROLOGIC: Cranial nerves 2-12 grossly intact. Moves all extremities PSYCHIATRIC: Mood and affect with dementia, no behavioral issues  Patient Active Problem List   Diagnosis Date Noted  . Anxiety disorder, unspecified 07/23/2017  . Iron deficiency anemia 03/29/2017  . GERD (gastroesophageal reflux disease) 12/22/2016  . Decreased hearing of both ears 08/19/2016  . Pressure injury of skin 08/15/2016  . Aspiration pneumonia ([place]) 08/14/2016  . Chronic diastolic CHF (congestive heart failure) ([place]) 08/14/2016  . Chronic right shoulder pain 02/09/2016  . Bilateral lower extremity edema 02/02/2016  . Ileitis 01/19/2016  . Pressure ulcer 01/09/2016  . SBO (small bowel obstruction) ([place]) 01/08/2016  . Narcotic addiction ([place])  05/15/2015  . Diarrhea 03/30/2015  . Acute pharyngitis 02/26/2015  . Osteoarthritis of right knee 02/22/2015  . Pain in joint, lower leg 10/31/2014  . Ulcer of sacral region, stage 4 ([place]) 04/15/2014  . Moderate aortic stenosis 04/14/2014  . Pre-syncope 04/14/2014  . Hypotension 04/14/2014  . Acute renal failure superimposed on stage 2 chronic kidney disease ([place]) 04/14/2014  . Anemia 04/14/2014  . Protein-calorie malnutrition, severe ([place]) 04/14/2014  . Altered mental status   . Dehydration 04/13/2014  . Altered mental state 04/13/2014  . Acute kidney injury ([place])   . Hypokalemia 03/25/2014  . Syncope 03/16/2014  . Acute encephalopathy 03/16/2014  . Leg swelling 03/02/2014  . Avascular necrosis of bone of right hip ([place]) 09/27/2013  . Primary osteoarthritis of right hip 09/07/2013  . Spinal stenosis, lumbar region, with neurogenic claudication 09/07/2013  . Closed fracture of pubic ramus ([place]) 09/07/2013  . BPPV (benign paroxysmal positional vertigo) 09/07/2013  . Pharyngeal swelling 08/30/2013  . Pelvic fracture ([place]) 08/28/2013  . Fall 08/26/2013  . Dizziness 08/26/2013  . Abnormal stress test 08/26/2013  . Preop cardiovascular exam 08/01/2013  . Aortic stenosis 08/01/2013  . Edema 06/30/2013  . Loss of weight 01/04/2013  . Situational stress 01/04/2013  . Chronic right hip pain 07/05/2012  . Knee pain, acute 06/29/2012  . Vertigo 06/24/2012  . Acute exacerbation of chronic low back pain 06/24/2012  . Lumbar pain with radiation down right leg 06/09/2012  . Osteoarthritis of hip 04/29/2012  . History of TIAs 04/17/2012  . S/P total hysterectomy and bilateral salpingo-oophorectomy 04/17/2012  . Hard of hearing 04/17/2012  . History of anemia 04/17/2012  . RLS (restless legs syndrome) 04/17/2012  . Chronic back pain 04/17/2012  . Spinal stenosis, lumbar 04/17/2012  . Lower back pain 12/20/2011  . Personal history of colonic polyps 10/08/2011  . H/O 09/14/2011  .  Essential hypertension 09/14/2011  . Restless legs syndrome 09/14/2011  . Constipation 08/20/2011  . Urinary incontinence 08/20/2011    CMP     Component Value Date/Time   NA 142 08/17/2016 0343   NA 142 08/17/2016   K 3.9 08/17/2016 0343   CL 106 08/17/2016 0343   CO2 28 08/17/2016 0343   GLUCOSE 92 08/17/2016 0343   BUN 22 (H) 08/17/2016 0343   BUN 22 (A) 08/17/2016   CREATININE 1.13 (H) 08/17/2016 0343   CREATININE 0.78 06/28/2014 1205   CALCIUM 7.8 (L) 08/17/2016 0343   PROT 6.6  08/14/2016 1519   ALBUMIN 3.4 (L) 08/14/2016 1519   AST 28 08/14/2016 1519   ALT 17 08/14/2016 1519   ALKPHOS 107 08/14/2016 1519   BILITOT 1.1 08/14/2016 1519   GFRNONAA 40 (L) 08/17/2016 0343   GFRAA 46 (L) 08/17/2016 0343   No results for input(s): NA, K, CL, CO2, GLUCOSE, BUN, CREATININE, CALCIUM, MG, PHOS in the last 8760 hours. No results for input(s): AST, ALT, ALKPHOS, BILITOT, PROT, ALBUMIN in the last 8760 hours. Recent Labs    04/20/17  WBC 7.9  HGB 9.6*  HCT 31*  PLT 295   No results for input(s): CHOL, LDLCALC, TRIG in the last 8760 hours.  Invalid input(s): HCL No results found for: [facility] Lab Results  Component Value Date   TSH 2.46 04/26/2016   Lab Results  Component Value Date   HGBA1C 5.6 03/19/2016   Lab Results  Component Value Date   CHOL 141 03/19/2016   HDL 55 03/19/2016   LDLCALC 71 03/19/2016   TRIG 77 03/19/2016   CHOLHDL 2.8 03/06/2014    Significant Diagnostic Results in last 30 days:  No results found.  Assessment and Plan  Constipation Stable; continue Amitiza 24 mcg p.o. daily  Essential hypertension Controlled; continue Demadex 60 mg Monday Wednesday Friday and 40 mg all other days  Chronic back pain Stable; pain regimen is controlled by pain clinic 15 mg every 12 and oxycodone 5 mg once daily     Anne D. Shelly Coil, MD

## 2018-02-07 ENCOUNTER — Encounter: Payer: Self-pay | Admitting: Internal Medicine

## 2018-02-07 NOTE — Assessment & Plan Note (Signed)
Controlled; continue Demadex 60 mg Monday Wednesday Friday and 40 mg all other days 

## 2018-02-07 NOTE — Assessment & Plan Note (Signed)
Stable; pain regimen is controlled by pain clinic 15 mg every 12 and oxycodone 5 mg once daily

## 2018-02-07 NOTE — Assessment & Plan Note (Signed)
Stable; continue Amitiza 24 mcg p.o. daily

## 2018-03-01 LAB — HEPATIC FUNCTION PANEL
ALT: 7 (ref 7–35)
AST: 14 (ref 13–35)
Alkaline Phosphatase: 94 (ref 25–125)

## 2018-03-01 LAB — VITAMIN D 25 HYDROXY (VIT D DEFICIENCY, FRACTURES): Vit D, 25-Hydroxy: 40.67

## 2018-03-01 LAB — CBC AND DIFFERENTIAL
HEMATOCRIT: 25 — AB (ref 36–46)
Hemoglobin: 8.4 — AB (ref 12.0–16.0)
Platelets: 285 (ref 150–399)
WBC: 8.1

## 2018-03-01 LAB — BASIC METABOLIC PANEL
BUN: 38 — AB (ref 4–21)
Creatinine: 1 (ref 0.5–1.1)
GLUCOSE: 92
Potassium: 3.9 (ref 3.4–5.3)
Sodium: 140 (ref 137–147)

## 2018-03-01 LAB — HEMOGLOBIN A1C: Hemoglobin A1C: 6.3

## 2018-03-01 LAB — LIPID PANEL
CHOLESTEROL: 128 (ref 0–200)
HDL: 50 (ref 35–70)
LDL Cholesterol: 65
Triglycerides: 65 (ref 40–160)

## 2018-03-01 LAB — TSH: TSH: 2.02 (ref 0.41–5.90)

## 2018-03-02 ENCOUNTER — Encounter: Payer: Self-pay | Admitting: Internal Medicine

## 2018-03-02 ENCOUNTER — Non-Acute Institutional Stay (SKILLED_NURSING_FACILITY): Payer: Medicare Other | Admitting: Internal Medicine

## 2018-03-02 DIAGNOSIS — M87051 Idiopathic aseptic necrosis of right femur: Secondary | ICD-10-CM | POA: Diagnosis not present

## 2018-03-02 DIAGNOSIS — D508 Other iron deficiency anemias: Secondary | ICD-10-CM

## 2018-03-02 DIAGNOSIS — F411 Generalized anxiety disorder: Secondary | ICD-10-CM | POA: Diagnosis not present

## 2018-03-02 NOTE — Progress Notes (Signed)
Location:  [facility] Room Number: 303D Place of Service:  SNF [phone])  Shelly Martin. Shelly Coil, MD  Patient Care Team: [name], MD as PCP - General (Internal Medicine) [name], MD as Consulting Physician (Orthopedic Surgery) [name], MD as Consulting Physician (Otolaryngology) Mast, Man X, NP as Nurse Practitioner (Internal Medicine)  Extended Emergency Contact Information Primary Emergency Contact: [name] Address: [place], [place] [address] [name] of [place] Phone: [phone] Relation: Daughter Secondary Emergency Contact: [place], [place] of [place] Mobile Phone: [phone] Relation: Son    Allergies: Amoxicillin; Baclofen; Hctz [hydrochlorothiazide]; and Valium [diazepam]  Chief Complaint  Patient presents with  . Medical Management of Chronic Issues    Routine Visit  . Health Maintenance    Influenza vacc.    HPI: Patient is 84 y.o. female who is being seen for routine issues of iron deficiency anemia, anxiety, and avascular necrosis of right hip.  Past Medical History:  Diagnosis Date  . Abnormal stress test 08/26/2013  . Acute encephalopathy   . AKI (acute kidney injury) ([place])   . Altered mental state   . Anemia   . Anxiety disorder, unspecified   . Aortic stenosis   . Arthritis    Back, knees  . Avascular necrosis of bone of right hip ([place]) 09/27/2013  . Bowel incontinence   . BPPV (benign paroxysmal positional vertigo) 09/07/2013  . Closed fracture of pubic ramus ([place]) 09/07/2013  . Colon polyps   . Constipation   . Decreased hearing of both ears 08/19/2016   Last Assessment & Plan:  Concern over hearing loss. Slowly progressive hearing loss.  No history of ear surgery, trauma or infection. EXAMINATION shows normal external canals and tympanic membranes. AUDIOGRAM severe sensorineural hearing loss.  Symmetric in nature. PLAN:  Reassured I do not see anything bad going on.  She is a candidate for hearing aids.   will check her benefits.  . Gastric ulcer    years ago  . History of anemia 04/17/2012  . History of TIAs 04/17/2012   June 2011   . Hypertension   . Hypokalemia   . Pelvic fracture ([place]) 08/28/2013  . Personal history of colonic polyps 10/08/2011   Index polypectomy 2013   . Restless legs syndrome    on Requip  . S/P total hysterectomy and bilateral salpingo-oophorectomy 04/17/2012  . Situational stress   . Spinal stenosis   . Spinal stenosis, lumbar 04/17/2012  . Spinal stenosis, lumbar region, with neurogenic claudication 09/07/2013  . Stroke ([place])    Mini, no residual  . TIA (transient ischemic attack)   . Ulcer causing bleeding and hole in wall of stomach or small intestine   . Urinary bladder calculus   . Urinary incontinence   . Vertigo     Past Surgical History:  Procedure Laterality Date  . COLON RESECTION  50 years ago  . LUMBAR LAMINECTOMY/DECOMPRESSION MICRODISCECTOMY  06/25/2011   Procedure: LUMBAR LAMINECTOMY/DECOMPRESSION MICRODISCECTOMY;  Surgeon: Hosie Spangle, MD;  Location: [facility];  Service: Neurosurgery;  Laterality: Right;  RIGHT Lumbar Two-Three hemilaminectomy and microdiskectomy  . TONSILLECTOMY    . TOTAL ABDOMINAL HYSTERECTOMY W/ BILATERAL SALPINGOOPHORECTOMY  04/17/2012  . UTERINE FIBROID SURGERY      Allergies as of 03/02/2018      Reactions   Amoxicillin Other (  See Comments)   Reaction unknown   Baclofen Other (See Comments)   "fuzzy in the eyes" and dizzy   Hctz [hydrochlorothiazide]    nausea   Valium [diazepam] Other (See Comments)   Pt became unresponsive and O2 sats dropped.      Medication List        Accurate as of 03/02/18 11:59 PM. Always use your most recent med list.          ferrous sulfate 325 (65 FE) MG tablet Take 325 mg by mouth 2 (two) times daily.   lubiprostone 24 MCG capsule Commonly known as:  AMITIZA Take 24 mcg by  mouth 2 (two) times daily with a meal.   Melatonin 3 MG Tabs Take 1 tablet by mouth at bedtime.   mirtazapine 15 MG tablet Commonly known as:  REMERON Take 15 mg by mouth at bedtime.   morphine 15 MG 12 hr tablet Commonly known as:  MS CONTIN Take 1 tablet (15 mg total) by mouth 2 (two) times daily.   multivitamin tablet Take 1 tablet by mouth daily.   MYRBETRIQ 50 MG Tb24 tablet Generic drug:  mirabegron ER Take 50 mg by mouth daily. bladder   ondansetron 4 MG tablet Commonly known as:  ZOFRAN Take 4 mg by mouth every 12 (twelve) hours as needed for nausea or vomiting.   oxyCODONE 5 MG immediate release tablet Commonly known as:  Oxy IR/ROXICODONE Take 1 tablet (5 mg total) by mouth daily. At 12 pm   potassium chloride 10 MEQ tablet Commonly known as:  K-DUR Take 20 mEq by mouth daily. 2 tablets   PROTONIX 40 MG tablet Generic drug:  pantoprazole Take 40 mg by mouth daily.   rOPINIRole 4 MG tablet Commonly known as:  REQUIP Take 4 mg by mouth at bedtime.   SYSTANE BALANCE 0.6 % Soln Generic drug:  Propylene Glycol Place 1 drop into both eyes 2 (two) times daily.   torsemide 20 MG tablet Commonly known as:  DEMADEX Take 40 mg by mouth every Tuesday, Thursday, Saturday, and Sunday.   torsemide 20 MG tablet Commonly known as:  DEMADEX Take 60 mg by mouth every Monday, Wednesday, and Friday. 3 tabs   Vitamin D3 50000 units Caps Take 1 capsule by mouth every 30 (thirty) days.       No orders of the defined types were placed in this encounter.   Immunization History  Administered Date(s) Administered  . Influenza Split 04/15/2012  . Influenza,inj,Quad PF,6+ Mos 03/06/2014  . Influenza-Unspecified 03/01/2016, 05/01/2017  . PPD Test 08/30/2014, 09/18/2014  . Pneumococcal Conjugate-13 09/25/2017  . Pneumococcal Polysaccharide-23 08/29/2013    Social History   Tobacco Use  . Smoking status: Never Smoker  . Smokeless tobacco: Never Used  Substance Use  Topics  . Alcohol use: Yes    Alcohol/week: 0.0 standard drinks    Comment: rarely    Review of Systems  DATA OBTAINED: from patient-limited; nursing-no acute concerns GENERAL:  no fevers, fatigue, appetite changes SKIN: No itching, rash HEENT: No complaint RESPIRATORY: No cough, wheezing, SOB CARDIAC: No chest pain, palpitations, lower extremity edema  GI: No abdominal pain, No N/V/D or constipation, No heartburn or reflux  GU: No dysuria, frequency or urgency, or incontinence  MUSCULOSKELETAL: No unrelieved bone/joint pain NEUROLOGIC: No headache, dizziness  PSYCHIATRIC: No overt anxiety or sadness  Vitals:   03/02/18 1029  BP: 128/69  Pulse: 77  Resp: 20  Temp: (!) 97 F (36.1 C)   Body  mass index is 17.58 kg/m. Physical Exam  GENERAL APPEARANCE: Alert, conversant, No acute distress  SKIN: No diaphoresis rash HEENT: Unremarkable RESPIRATORY: Breathing is even, unlabored. Lung sounds are clear   CARDIOVASCULAR: Heart RRR no murmurs, rubs or gallops. No peripheral edema  GASTROINTESTINAL: Abdomen is soft, non-tender, not distended w/ normal bowel sounds.  GENITOURINARY: Bladder non tender, not distended  MUSCULOSKELETAL: No abnormal joints or musculature NEUROLOGIC: Cranial nerves 2-12 grossly intact. Moves all extremities PSYCHIATRIC: Mood and affect dementia, patient perseverates, no behavioral issues  Patient Active Problem List   Diagnosis Date Noted  . Anxiety disorder, unspecified 07/23/2017  . Iron deficiency anemia 03/29/2017  . GERD (gastroesophageal reflux disease) 12/22/2016  . Decreased hearing of both ears 08/19/2016  . Pressure injury of skin 08/15/2016  . Aspiration pneumonia ([place]) 08/14/2016  . Chronic diastolic CHF (congestive heart failure) ([place]) 08/14/2016  . Chronic right shoulder pain 02/09/2016  . Bilateral lower extremity edema 02/02/2016  . Ileitis 01/19/2016  . Pressure ulcer 01/09/2016  . SBO (small bowel obstruction) ([place]) 01/08/2016   . Narcotic addiction ([place]) 05/15/2015  . Diarrhea 03/30/2015  . Acute pharyngitis 02/26/2015  . Osteoarthritis of right knee 02/22/2015  . Pain in joint, lower leg 10/31/2014  . Ulcer of sacral region, stage 4 ([place]) 04/15/2014  . Moderate aortic stenosis 04/14/2014  . Pre-syncope 04/14/2014  . Hypotension 04/14/2014  . Acute renal failure superimposed on stage 2 chronic kidney disease ([place]) 04/14/2014  . Anemia 04/14/2014  . Protein-calorie malnutrition, severe ([place]) 04/14/2014  . Altered mental status   . Dehydration 04/13/2014  . Altered mental state 04/13/2014  . Acute kidney injury ([place])   . Hypokalemia 03/25/2014  . Syncope 03/16/2014  . Acute encephalopathy 03/16/2014  . Leg swelling 03/02/2014  . Avascular necrosis of bone of right hip ([place]) 09/27/2013  . Primary osteoarthritis of right hip 09/07/2013  . Spinal stenosis, lumbar region, with neurogenic claudication 09/07/2013  . Closed fracture of pubic ramus ([place]) 09/07/2013  . BPPV (benign paroxysmal positional vertigo) 09/07/2013  . Pharyngeal swelling 08/30/2013  . Pelvic fracture ([place]) 08/28/2013  . Fall 08/26/2013  . Dizziness 08/26/2013  . Abnormal stress test 08/26/2013  . Preop cardiovascular exam 08/01/2013  . Aortic stenosis 08/01/2013  . Edema 06/30/2013  . Loss of weight 01/04/2013  . Situational stress 01/04/2013  . Chronic right hip pain 07/05/2012  . Knee pain, acute 06/29/2012  . Vertigo 06/24/2012  . Acute exacerbation of chronic low back pain 06/24/2012  . Lumbar pain with radiation down right leg 06/09/2012  . Osteoarthritis of hip 04/29/2012  . History of TIAs 04/17/2012  . S/P total hysterectomy and bilateral salpingo-oophorectomy 04/17/2012  . Hard of hearing 04/17/2012  . History of anemia 04/17/2012  . RLS (restless legs syndrome) 04/17/2012  . Chronic back pain 04/17/2012  . Spinal stenosis, lumbar 04/17/2012  . Lower back pain 12/20/2011  . Personal history of colonic polyps  10/08/2011  . H/O 09/14/2011  . Essential hypertension 09/14/2011  . Restless legs syndrome 09/14/2011  . Constipation 08/20/2011  . Urinary incontinence 08/20/2011    CMP     Component Value Date/Time   NA 140 03/01/2018   K 3.9 03/01/2018   CL 106 08/17/2016 0343   CO2 28 08/17/2016 0343   GLUCOSE 92 08/17/2016 0343   BUN 38 (A) 03/01/2018   CREATININE 1.0 03/01/2018   CREATININE 1.13 (H) 08/17/2016 0343   CREATININE 0.78 06/28/2014 1205   CALCIUM 7.8 (L) 08/17/2016 0343   PROT 6.6  08/14/2016 1519   ALBUMIN 3.4 (L) 08/14/2016 1519   AST 14 03/01/2018   ALT 7 03/01/2018   ALKPHOS 94 03/01/2018   BILITOT 1.1 08/14/2016 1519   GFRNONAA 40 (L) 08/17/2016 0343   GFRAA 46 (L) 08/17/2016 0343   Recent Labs    03/01/18  NA 140  K 3.9  BUN 38*  CREATININE 1.0   Recent Labs    03/01/18  AST 14  ALT 7  ALKPHOS 94   Recent Labs    04/20/17 03/01/18  WBC 7.9 8.1  HGB 9.6* 8.4*  HCT 31* 25*  PLT 295 285   Recent Labs    03/01/18  CHOL 128  LDLCALC 65  TRIG 65   No results found for: [facility] Lab Results  Component Value Date   TSH 2.02 03/01/2018   Lab Results  Component Value Date   HGBA1C 6.3 03/01/2018   Lab Results  Component Value Date   CHOL 128 03/01/2018   HDL 50 03/01/2018   LDLCALC 65 03/01/2018   TRIG 65 03/01/2018   CHOLHDL 2.8 03/06/2014    Significant Diagnostic Results in last 30 days:  No results found.  Assessment and Plan  Iron deficiency anemia Recent hemoglobin 8.4 which is down from prior of 9.6; patient has had no bleeding episodes noted; continue iron 325 twice daily  Anxiety disorder, unspecified Still requiring no meds, but continued to worsen his patient's admission worsens; continue supportive care  Avascular necrosis of bone of right hip Chronic and stable; patient is followed by pain clinic and treated with MS Contin 15 mg twice daily and and 1 tablet of oxycodone 5 mg daily     Anne D. Shelly Coil,  MD

## 2018-03-26 ENCOUNTER — Encounter: Payer: Self-pay | Admitting: Internal Medicine

## 2018-03-26 NOTE — Assessment & Plan Note (Signed)
Recent hemoglobin 8.4 which is down from prior of 9.6; patient has had no bleeding episodes noted; continue iron 325 twice daily

## 2018-03-26 NOTE — Assessment & Plan Note (Signed)
Still requiring no meds, but continued to worsen his patient's admission worsens; continue supportive care

## 2018-03-26 NOTE — Assessment & Plan Note (Signed)
Chronic and stable; patient is followed by pain clinic and treated with MS Contin 15 mg twice daily and and 1 tablet of oxycodone 5 mg daily

## 2018-04-06 ENCOUNTER — Encounter: Payer: Self-pay | Admitting: Internal Medicine

## 2018-04-06 ENCOUNTER — Non-Acute Institutional Stay (SKILLED_NURSING_FACILITY): Payer: Medicare Other | Admitting: Internal Medicine

## 2018-04-06 DIAGNOSIS — D500 Iron deficiency anemia secondary to blood loss (chronic): Secondary | ICD-10-CM

## 2018-04-06 DIAGNOSIS — M48062 Spinal stenosis, lumbar region with neurogenic claudication: Secondary | ICD-10-CM | POA: Diagnosis not present

## 2018-04-06 DIAGNOSIS — F1120 Opioid dependence, uncomplicated: Secondary | ICD-10-CM

## 2018-04-06 NOTE — Progress Notes (Signed)
Location:  [facility] Room Number: 303D Place of Service:  SNF [phone])  Shelly Martin. Shelly Coil, MD  Patient Care Team: [name], MD as PCP - General (Internal Medicine) [name], MD as Consulting Physician (Orthopedic Surgery) [name], MD as Consulting Physician (Otolaryngology) Mast, Man X, NP as Nurse Practitioner (Internal Medicine)  Extended Emergency Contact Information Primary Emergency Contact: [name] Address: [place], [place] [address] [name] of [place] Phone: [phone] Relation: Daughter Secondary Emergency Contact: [place], [place] of [place] Mobile Phone: [phone] Relation: Son    Allergies: Amoxicillin; Baclofen; Hctz [hydrochlorothiazide]; and Valium [diazepam]  Chief Complaint  Patient presents with  . Medical Management of Chronic Issues    Routine visit  . Health Maintenance    Influenza vacc.    HPI: Patient is 84 y.o. female who is being seen for routine issues of narcotic addiction, anemia, and lumbar spinal stenosis.  Past Medical History:  Diagnosis Date  . Abnormal stress test 08/26/2013  . Acute encephalopathy   . AKI (acute kidney injury) ([place])   . Altered mental state   . Anemia   . Anxiety disorder, unspecified   . Aortic stenosis   . Arthritis    Back, knees  . Avascular necrosis of bone of right hip ([place]) 09/27/2013  . Bowel incontinence   . BPPV (benign paroxysmal positional vertigo) 09/07/2013  . Closed fracture of pubic ramus ([place]) 09/07/2013  . Colon polyps   . Constipation   . Decreased hearing of both ears 08/19/2016   Last Assessment & Plan:  Concern over hearing loss. Slowly progressive hearing loss.  No history of ear surgery, trauma or infection. EXAMINATION shows normal external canals and tympanic membranes. AUDIOGRAM severe sensorineural hearing loss.  Symmetric in nature. PLAN: Reassured I do  not see anything bad going on.  She is a candidate for hearing aids.   will check her benefits.  . Gastric ulcer    years ago  . History of anemia 04/17/2012  . History of TIAs 04/17/2012   June 2011   . Hypertension   . Hypokalemia   . Pelvic fracture ([place]) 08/28/2013  . Personal history of colonic polyps 10/08/2011   Index polypectomy 2013   . Restless legs syndrome    on Requip  . S/P total hysterectomy and bilateral salpingo-oophorectomy 04/17/2012  . Situational stress   . Spinal stenosis   . Spinal stenosis, lumbar 04/17/2012  . Spinal stenosis, lumbar region, with neurogenic claudication 09/07/2013  . Stroke ([place])    Mini, no residual  . TIA (transient ischemic attack)   . Ulcer causing bleeding and hole in wall of stomach or small intestine   . Urinary bladder calculus   . Urinary incontinence   . Vertigo     Past Surgical History:  Procedure Laterality Date  . COLON RESECTION  50 years ago  . LUMBAR LAMINECTOMY/DECOMPRESSION MICRODISCECTOMY  06/25/2011   Procedure: LUMBAR LAMINECTOMY/DECOMPRESSION MICRODISCECTOMY;  Surgeon: Hosie Spangle, MD;  Location: [facility];  Service: Neurosurgery;  Laterality: Right;  RIGHT Lumbar Two-Three hemilaminectomy and microdiskectomy  . TONSILLECTOMY    . TOTAL ABDOMINAL HYSTERECTOMY W/ BILATERAL SALPINGOOPHORECTOMY  04/17/2012  . UTERINE FIBROID SURGERY      Allergies as of 04/06/2018      Reactions   Amoxicillin Other (See Comments)  Reaction unknown   Baclofen Other (See Comments)   "fuzzy in the eyes" and dizzy   Hctz [hydrochlorothiazide]    nausea   Valium [diazepam] Other (See Comments)   Pt became unresponsive and O2 sats dropped.      Medication List        Accurate as of 04/06/18 11:59 PM. Always use your most recent med list.          ferrous sulfate 325 (65 FE) MG tablet Take 325 mg by mouth 2 (two) times daily.   lubiprostone 24 MCG capsule Commonly known as:  AMITIZA Take 24 mcg by mouth 2 (two)  times daily with a meal.   Melatonin 3 MG Tabs Take 1 tablet by mouth at bedtime.   mirtazapine 15 MG tablet Commonly known as:  REMERON Take 15 mg by mouth at bedtime.   morphine 15 MG 12 hr tablet Commonly known as:  MS CONTIN Take 1 tablet (15 mg total) by mouth 2 (two) times daily.   multivitamin tablet Take 1 tablet by mouth daily.   MYRBETRIQ 50 MG Tb24 tablet Generic drug:  mirabegron ER Take 50 mg by mouth daily. bladder   ondansetron 4 MG tablet Commonly known as:  ZOFRAN Take 4 mg by mouth every 12 (twelve) hours as needed for nausea or vomiting.   oxyCODONE 5 MG immediate release tablet Commonly known as:  Oxy IR/ROXICODONE Take 1 tablet (5 mg total) by mouth daily. At 12 pm   potassium chloride 10 MEQ tablet Commonly known as:  K-DUR Take 20 mEq by mouth daily. 2 tablets   PROTONIX 40 MG tablet Generic drug:  pantoprazole Take 40 mg by mouth daily.   rOPINIRole 4 MG tablet Commonly known as:  REQUIP Take 4 mg by mouth at bedtime.   SYSTANE BALANCE 0.6 % Soln Generic drug:  Propylene Glycol Place 1 drop into both eyes 2 (two) times daily.   torsemide 20 MG tablet Commonly known as:  DEMADEX Take 40 mg by mouth every Tuesday, Thursday, Saturday, and Sunday.   torsemide 20 MG tablet Commonly known as:  DEMADEX Take 60 mg by mouth every Monday, Wednesday, and Friday. 3 tabs   Vitamin D3 1.25 MG (50000 UT) Caps Take 1 capsule by mouth every 30 (thirty) days.       No orders of the defined types were placed in this encounter.   Immunization History  Administered Date(s) Administered  . Influenza Split 04/15/2012  . Influenza,inj,Quad PF,6+ Mos 03/06/2014  . Influenza-Unspecified 03/01/2016, 05/01/2017  . PPD Test 08/30/2014, 09/18/2014  . Pneumococcal Conjugate-13 09/25/2017  . Pneumococcal Polysaccharide-23 08/29/2013    Social History   Tobacco Use  . Smoking status: Never Smoker  . Smokeless tobacco: Never Used  Substance Use Topics   . Alcohol use: Yes    Alcohol/week: 0.0 standard drinks    Comment: rarely    Review of Systems  DATA OBTAINED: from patient, nurse GENERAL:  no fevers, fatigue, appetite changes SKIN: No itching, rash HEENT: No complaint RESPIRATORY: No cough, wheezing, SOB CARDIAC: No chest pain, palpitations, lower extremity edema  GI: No abdominal pain, No N/V/D or constipation, No heartburn or reflux  GU: No dysuria, frequency or urgency, or incontinence  MUSCULOSKELETAL: Occasional hip pain NEUROLOGIC: No headache, dizziness  PSYCHIATRIC: No overt anxiety or sadness  Vitals:   04/06/18 1446  BP: 120/89  Pulse: 73  Resp: 20  Temp: 98.2 F (36.8 C)  SpO2: 97%   Body mass index is  17.58 kg/m. Physical Exam  GENERAL APPEARANCE: Alert, conversant, No acute distress  SKIN: No diaphoresis rash HEENT: Unremarkable RESPIRATORY: Breathing is even, unlabored. Lung sounds are clear   CARDIOVASCULAR: Heart RRR no murmurs, rubs or gallops. No peripheral edema  GASTROINTESTINAL: Abdomen is soft, non-tender, not distended w/ normal bowel sounds.  GENITOURINARY: Bladder non tender, not distended  MUSCULOSKELETAL: No abnormal joints or musculature NEUROLOGIC: Cranial nerves 2-12 grossly intact. Moves all extremities PSYCHIATRIC: Mood and affect appropriate to situation but with dementia that is progressing, no behavioral issues  Patient Active Problem List   Diagnosis Date Noted  . Anxiety disorder, unspecified 07/23/2017  . Iron deficiency anemia 03/29/2017  . GERD (gastroesophageal reflux disease) 12/22/2016  . Decreased hearing of both ears 08/19/2016  . Pressure injury of skin 08/15/2016  . Aspiration pneumonia ([place]) 08/14/2016  . Chronic diastolic CHF (congestive heart failure) ([place]) 08/14/2016  . Chronic right shoulder pain 02/09/2016  . Bilateral lower extremity edema 02/02/2016  . Ileitis 01/19/2016  . Pressure ulcer 01/09/2016  . SBO (small bowel obstruction) ([place]) 01/08/2016    . Narcotic addiction ([place]) 05/15/2015  . Diarrhea 03/30/2015  . Acute pharyngitis 02/26/2015  . Osteoarthritis of right knee 02/22/2015  . Pain in joint, lower leg 10/31/2014  . Ulcer of sacral region, stage 4 ([place]) 04/15/2014  . Moderate aortic stenosis 04/14/2014  . Pre-syncope 04/14/2014  . Hypotension 04/14/2014  . Acute renal failure superimposed on stage 2 chronic kidney disease ([place]) 04/14/2014  . Anemia 04/14/2014  . Protein-calorie malnutrition, severe ([place]) 04/14/2014  . Altered mental status   . Dehydration 04/13/2014  . Altered mental state 04/13/2014  . Acute kidney injury ([place])   . Hypokalemia 03/25/2014  . Syncope 03/16/2014  . Acute encephalopathy 03/16/2014  . Leg swelling 03/02/2014  . Avascular necrosis of bone of right hip ([place]) 09/27/2013  . Primary osteoarthritis of right hip 09/07/2013  . Spinal stenosis, lumbar region, with neurogenic claudication 09/07/2013  . Closed fracture of pubic ramus ([place]) 09/07/2013  . BPPV (benign paroxysmal positional vertigo) 09/07/2013  . Pharyngeal swelling 08/30/2013  . Pelvic fracture ([place]) 08/28/2013  . Fall 08/26/2013  . Dizziness 08/26/2013  . Abnormal stress test 08/26/2013  . Preop cardiovascular exam 08/01/2013  . Aortic stenosis 08/01/2013  . Edema 06/30/2013  . Loss of weight 01/04/2013  . Situational stress 01/04/2013  . Chronic right hip pain 07/05/2012  . Knee pain, acute 06/29/2012  . Vertigo 06/24/2012  . Acute exacerbation of chronic low back pain 06/24/2012  . Lumbar pain with radiation down right leg 06/09/2012  . Osteoarthritis of hip 04/29/2012  . History of TIAs 04/17/2012  . S/P total hysterectomy and bilateral salpingo-oophorectomy 04/17/2012  . Hard of hearing 04/17/2012  . History of anemia 04/17/2012  . RLS (restless legs syndrome) 04/17/2012  . Chronic back pain 04/17/2012  . Spinal stenosis, lumbar 04/17/2012  . Lower back pain 12/20/2011  . Personal history of colonic polyps  10/08/2011  . H/O 09/14/2011  . Essential hypertension 09/14/2011  . Restless legs syndrome 09/14/2011  . Constipation 08/20/2011  . Urinary incontinence 08/20/2011    CMP     Component Value Date/Time   NA 140 03/01/2018   K 3.9 03/01/2018   CL 106 08/17/2016 0343   CO2 28 08/17/2016 0343   GLUCOSE 92 08/17/2016 0343   BUN 38 (A) 03/01/2018   CREATININE 1.0 03/01/2018   CREATININE 1.13 (H) 08/17/2016 0343   CREATININE 0.78 06/28/2014 1205   CALCIUM 7.8 (L) 08/17/2016 0626  PROT 6.6 08/14/2016 1519   ALBUMIN 3.4 (L) 08/14/2016 1519   AST 14 03/01/2018   ALT 7 03/01/2018   ALKPHOS 94 03/01/2018   BILITOT 1.1 08/14/2016 1519   GFRNONAA 40 (L) 08/17/2016 0343   GFRAA 46 (L) 08/17/2016 0343   Recent Labs    03/01/18  NA 140  K 3.9  BUN 38*  CREATININE 1.0   Recent Labs    03/01/18  AST 14  ALT 7  ALKPHOS 94   Recent Labs    04/20/17 03/01/18  WBC 7.9 8.1  HGB 9.6* 8.4*  HCT 31* 25*  PLT 295 285   Recent Labs    03/01/18  CHOL 128  LDLCALC 65  TRIG 65   No results found for: [facility] Lab Results  Component Value Date   TSH 2.02 03/01/2018   Lab Results  Component Value Date   HGBA1C 6.3 03/01/2018   Lab Results  Component Value Date   CHOL 128 03/01/2018   HDL 50 03/01/2018   LDLCALC 65 03/01/2018   TRIG 65 03/01/2018   CHOLHDL 2.8 03/06/2014    Significant Diagnostic Results in last 30 days:  No results found.  Assessment and Plan  Narcotic addiction ([place]) Aseptic necrosis of right hip which is chronic and 1 of the reasons patient is addicted narcotics; patient is followed by pain clinic; narcotics will be used for the remainder of patient's life-she is 84 years old  Anemia Hemoglobin 8.4 which is stable from priors; continue iron 325 mg twice daily  Spinal stenosis, lumbar Patient rarely complains about her low back and never about her legs she is well controlled on her MS Contin 15 mg every 12 and 1 Percocet every day at  noon    [place] D. Shelly Coil, MD

## 2018-04-10 ENCOUNTER — Encounter: Payer: Self-pay | Admitting: Internal Medicine

## 2018-04-10 NOTE — Assessment & Plan Note (Signed)
Hemoglobin 8.4 which is stable from priors; continue iron 325 mg twice daily

## 2018-04-10 NOTE — Assessment & Plan Note (Signed)
Aseptic necrosis of right hip which is chronic and 1 of the reasons patient is addicted narcotics; patient is followed by pain clinic; narcotics will be used for the remainder of patient's life-she is 84 years old

## 2018-04-10 NOTE — Assessment & Plan Note (Addendum)
Patient rarely complains about her low back and never about her legs she is well controlled on her MS Contin 15 mg every 12 and 1 Percocet every day at noon

## 2018-05-04 ENCOUNTER — Non-Acute Institutional Stay (SKILLED_NURSING_FACILITY): Payer: Medicare Other | Admitting: Internal Medicine

## 2018-05-04 ENCOUNTER — Encounter: Payer: Self-pay | Admitting: Internal Medicine

## 2018-05-04 DIAGNOSIS — K2100 Gastro-esophageal reflux disease with esophagitis, without bleeding: Secondary | ICD-10-CM

## 2018-05-04 DIAGNOSIS — K5901 Slow transit constipation: Secondary | ICD-10-CM | POA: Diagnosis not present

## 2018-05-04 DIAGNOSIS — I10 Essential (primary) hypertension: Secondary | ICD-10-CM | POA: Diagnosis not present

## 2018-05-04 NOTE — Progress Notes (Signed)
Location:  [facility] Room Number: 303D Place of Service:  SNF [phone])  Shelly Martin. [name], MD  Patient Care Team: [name], MD as PCP - General (Internal Medicine) [name], MD as Consulting Physician (Orthopedic Surgery) [name], MD as Consulting Physician (Otolaryngology) Mast, Man X, NP as Nurse Practitioner (Internal Medicine)  Extended Emergency Contact Information Primary Emergency Contact: [name] Address: [place], [place] [address] [name] Phone: [phone] Relation: Daughter Secondary Emergency Contact: Toyah, [place] of [place] Mobile Phone: [phone] Relation: Son    Allergies: Amoxicillin; Baclofen; Hctz [hydrochlorothiazide]; and Valium [diazepam]  Chief Complaint  Patient presents with  . Medical Management of Chronic Issues    Routine Visit    HPI: Patient is 84 y.o. female who is being seen for routine issues of hypertension, GERD, and constipation.  Past Medical History:  Diagnosis Date  . Abnormal stress test 08/26/2013  . Acute encephalopathy   . AKI (acute kidney injury) ([place])   . Altered mental state   . Anemia   . Anxiety disorder, unspecified   . Aortic stenosis   . Arthritis    Back, knees  . Avascular necrosis of bone of right hip ([place]) 09/27/2013  . Bowel incontinence   . BPPV (benign paroxysmal positional vertigo) 09/07/2013  . Closed fracture of pubic ramus ([place]) 09/07/2013  . Colon polyps   . Constipation   . Decreased hearing of both ears 08/19/2016   Last Assessment & Plan:  Concern over hearing loss. Slowly progressive hearing loss.  No history of ear surgery, trauma or infection. EXAMINATION shows normal external canals and tympanic membranes. AUDIOGRAM severe sensorineural hearing loss.  Symmetric in nature. PLAN: Reassured I do not see anything bad going on.  She is a candidate for  hearing aids.   will check her benefits.  . Gastric ulcer    years ago  . History of anemia 04/17/2012  . History of TIAs 04/17/2012   June 2011   . Hypertension   . Hypokalemia   . Pelvic fracture ([place]) 08/28/2013  . Personal history of colonic polyps 10/08/2011   Index polypectomy 2013   . Restless legs syndrome    on Requip  . S/P total hysterectomy and bilateral salpingo-oophorectomy 04/17/2012  . Situational stress   . Spinal stenosis   . Spinal stenosis, lumbar 04/17/2012  . Spinal stenosis, lumbar region, with neurogenic claudication 09/07/2013  . Stroke ([place])    Mini, no residual  . TIA (transient ischemic attack)   . Ulcer causing bleeding and hole in wall of stomach or small intestine   . Urinary bladder calculus   . Urinary incontinence   . Vertigo     Past Surgical History:  Procedure Laterality Date  . COLON RESECTION  50 years ago  . LUMBAR LAMINECTOMY/DECOMPRESSION MICRODISCECTOMY  06/25/2011   Procedure: LUMBAR LAMINECTOMY/DECOMPRESSION MICRODISCECTOMY;  Surgeon: Hosie Spangle, MD;  Location: [facility];  Service: Neurosurgery;  Laterality: Right;  RIGHT Lumbar Two-Three hemilaminectomy and microdiskectomy  . TONSILLECTOMY    . TOTAL ABDOMINAL HYSTERECTOMY W/ BILATERAL SALPINGOOPHORECTOMY  04/17/2012  . UTERINE FIBROID SURGERY      Allergies as of 05/04/2018      Reactions   Amoxicillin Other (See Comments)   Reaction unknown   Baclofen Other (See Comments)   "fuzzy  in the eyes" and dizzy   Hctz [hydrochlorothiazide]    nausea   Valium [diazepam] Other (See Comments)   Pt became unresponsive and O2 sats dropped.      Medication List       Accurate as of May 04, 2018 11:59 PM. Always use your most recent med list.        ferrous sulfate 325 (65 FE) MG tablet Take 325 mg by mouth 2 (two) times daily.   lubiprostone 24 MCG capsule Commonly known as:  AMITIZA Take 24 mcg by mouth 2 (two) times daily with a meal.   Melatonin 3 MG  Tabs Take 1 tablet by mouth at bedtime.   mirtazapine 15 MG tablet Commonly known as:  REMERON Take 15 mg by mouth at bedtime.   morphine 15 MG 12 hr tablet Commonly known as:  MS CONTIN Take 1 tablet (15 mg total) by mouth 2 (two) times daily.   multivitamin tablet Take 1 tablet by mouth daily.   MYRBETRIQ 50 MG Tb24 tablet Generic drug:  mirabegron ER Take 50 mg by mouth daily. bladder   ondansetron 4 MG tablet Commonly known as:  ZOFRAN Take 4 mg by mouth every 12 (twelve) hours as needed for nausea or vomiting.   oxyCODONE 5 MG immediate release tablet Commonly known as:  Oxy IR/ROXICODONE Take 1 tablet (5 mg total) by mouth daily. At 12 pm   potassium chloride 10 MEQ tablet Commonly known as:  K-DUR Take 20 mEq by mouth daily. 2 tablets   PROTONIX 40 MG tablet Generic drug:  pantoprazole Take 40 mg by mouth daily.   rOPINIRole 4 MG tablet Commonly known as:  REQUIP Take 4 mg by mouth at bedtime.   SYSTANE BALANCE 0.6 % Soln Generic drug:  Propylene Glycol Place 1 drop into both eyes 2 (two) times daily.   torsemide 20 MG tablet Commonly known as:  DEMADEX Take 40 mg by mouth every Tuesday, Thursday, Saturday, and Sunday.   torsemide 20 MG tablet Commonly known as:  DEMADEX Take 60 mg by mouth every Monday, Wednesday, and Friday. 3 tabs   Vitamin D3 1.25 MG (50000 UT) Caps Take 1 capsule by mouth every 30 (thirty) days.       No orders of the defined types were placed in this encounter.   Immunization History  Administered Date(s) Administered  . Influenza Split 04/15/2012  . Influenza,inj,Quad PF,6+ Mos 03/06/2014  . Influenza-Unspecified 03/01/2016, 05/01/2017  . PPD Test 08/30/2014, 09/18/2014  . Pneumococcal Conjugate-13 09/25/2017  . Pneumococcal Polysaccharide-23 08/29/2013    Social History   Tobacco Use  . Smoking status: Never Smoker  . Smokeless tobacco: Never Used  Substance Use Topics  . Alcohol use: Yes    Alcohol/week: 0.0  standard drinks    Comment: rarely    Review of Systems  DATA OBTAINED: from patient-extremely limited; nursing-no acute concerns GENERAL:  no fevers, fatigue, appetite changes SKIN: No itching, rash HEENT: No complaint RESPIRATORY: No cough, wheezing, SOB CARDIAC: No chest pain, palpitations, lower extremity edema  GI: No abdominal pain, No N/V/D or constipation, No heartburn or reflux  GU: No dysuria, frequency or urgency, or incontinence  MUSCULOSKELETAL: No unrelieved bone/joint pain NEUROLOGIC: No headache, dizziness  PSYCHIATRIC: No overt anxiety or sadness  Vitals:   05/04/18 1401  BP: (!) 90/57  Pulse: 80  Resp: 18  Temp: 97.8 F (36.6 C)  SpO2: 93%   Body mass index is 17.3 kg/m. Physical Exam  GENERAL APPEARANCE:  Alert, conversant, No acute distress  SKIN: No diaphoresis rash HEENT: Unremarkable RESPIRATORY: Breathing is even, unlabored. Lung sounds are clear   CARDIOVASCULAR: Heart RRR no murmurs, rubs or gallops. No peripheral edema  GASTROINTESTINAL: Abdomen is soft, non-tender, not distended w/ normal bowel sounds.  GENITOURINARY: Bladder non tender, not distended  MUSCULOSKELETAL: No abnormal joints or musculature NEUROLOGIC: Cranial nerves 2-12 grossly intact. Moves all extremities PSYCHIATRIC: Mood and affect with dementia; perseveration, no behavioral issues  Patient Active Problem List   Diagnosis Date Noted  . Anxiety disorder, unspecified 07/23/2017  . Iron deficiency anemia 03/29/2017  . GERD (gastroesophageal reflux disease) 12/22/2016  . Decreased hearing of both ears 08/19/2016  . Pressure injury of skin 08/15/2016  . Aspiration pneumonia ([place]) 08/14/2016  . Chronic diastolic CHF (congestive heart failure) ([place]) 08/14/2016  . Chronic right shoulder pain 02/09/2016  . Bilateral lower extremity edema 02/02/2016  . Ileitis 01/19/2016  . Pressure ulcer 01/09/2016  . SBO (small bowel obstruction) ([place]) 01/08/2016  . Narcotic addiction ([place])  05/15/2015  . Diarrhea 03/30/2015  . Acute pharyngitis 02/26/2015  . Osteoarthritis of right knee 02/22/2015  . Pain in joint, lower leg 10/31/2014  . Ulcer of sacral region, stage 4 ([place]) 04/15/2014  . Moderate aortic stenosis 04/14/2014  . Pre-syncope 04/14/2014  . Hypotension 04/14/2014  . Acute renal failure superimposed on stage 2 chronic kidney disease ([place]) 04/14/2014  . Anemia 04/14/2014  . Protein-calorie malnutrition, severe ([place]) 04/14/2014  . Altered mental status   . Dehydration 04/13/2014  . Altered mental state 04/13/2014  . Acute kidney injury ([place])   . Hypokalemia 03/25/2014  . Syncope 03/16/2014  . Acute encephalopathy 03/16/2014  . Leg swelling 03/02/2014  . Avascular necrosis of bone of right hip ([place]) 09/27/2013  . Primary osteoarthritis of right hip 09/07/2013  . Spinal stenosis, lumbar region, with neurogenic claudication 09/07/2013  . Closed fracture of pubic ramus ([place]) 09/07/2013  . BPPV (benign paroxysmal positional vertigo) 09/07/2013  . Pharyngeal swelling 08/30/2013  . Pelvic fracture ([place]) 08/28/2013  . Fall 08/26/2013  . Dizziness 08/26/2013  . Abnormal stress test 08/26/2013  . Preop cardiovascular exam 08/01/2013  . Aortic stenosis 08/01/2013  . Edema 06/30/2013  . Loss of weight 01/04/2013  . Situational stress 01/04/2013  . Chronic right hip pain 07/05/2012  . Knee pain, acute 06/29/2012  . Vertigo 06/24/2012  . Acute exacerbation of chronic low back pain 06/24/2012  . Lumbar pain with radiation down right leg 06/09/2012  . Osteoarthritis of hip 04/29/2012  . History of TIAs 04/17/2012  . S/P total hysterectomy and bilateral salpingo-oophorectomy 04/17/2012  . Hard of hearing 04/17/2012  . History of anemia 04/17/2012  . RLS (restless legs syndrome) 04/17/2012  . Chronic back pain 04/17/2012  . Spinal stenosis, lumbar 04/17/2012  . Lower back pain 12/20/2011  . Personal history of colonic polyps 10/08/2011  . H/O 09/14/2011  .  Essential hypertension 09/14/2011  . Restless legs syndrome 09/14/2011  . Constipation 08/20/2011  . Urinary incontinence 08/20/2011    CMP     Component Value Date/Time   NA 140 03/01/2018   K 3.9 03/01/2018   CL 106 08/17/2016 0343   CO2 28 08/17/2016 0343   GLUCOSE 92 08/17/2016 0343   BUN 38 (A) 03/01/2018   CREATININE 1.0 03/01/2018   CREATININE 1.13 (H) 08/17/2016 0343   CREATININE 0.78 06/28/2014 1205   CALCIUM 7.8 (L) 08/17/2016 0343   PROT 6.6 08/14/2016 1519   ALBUMIN 3.4 (L) 08/14/2016 1519  AST 14 03/01/2018   ALT 7 03/01/2018   ALKPHOS 94 03/01/2018   BILITOT 1.1 08/14/2016 1519   GFRNONAA 40 (L) 08/17/2016 0343   GFRAA 46 (L) 08/17/2016 0343   Recent Labs    03/01/18  NA 140  K 3.9  BUN 38*  CREATININE 1.0   Recent Labs    03/01/18  AST 14  ALT 7  ALKPHOS 94   Recent Labs    03/01/18  WBC 8.1  HGB 8.4*  HCT 25*  PLT 285   Recent Labs    03/01/18  CHOL 128  LDLCALC 65  TRIG 65   No results found for: [facility] Lab Results  Component Value Date   TSH 2.02 03/01/2018   Lab Results  Component Value Date   HGBA1C 6.3 03/01/2018   Lab Results  Component Value Date   CHOL 128 03/01/2018   HDL 50 03/01/2018   LDLCALC 65 03/01/2018   TRIG 65 03/01/2018   CHOLHDL 2.8 03/06/2014    Significant Diagnostic Results in last 30 days:  No results found.  Assessment and Plan  Essential hypertension Controlled; continue Demadex 40 mg Tuesday Thursday Saturday Sunday and 60 mg Monday Wednesday Friday  GERD (gastroesophageal reflux disease) No reported problems; continue Protonix 40 mg daily  Constipation Nurses report stability; patient at time complains that she is either having diarrhea or not having a bowel movement at all but she is very very forgetful; continue Amitiza 24 mcg daily    [place] Emily D. [name], MD

## 2018-05-12 ENCOUNTER — Other Ambulatory Visit: Payer: Self-pay

## 2018-05-12 MED ORDER — OXYCODONE HCL 5 MG PO TABS: 5.0000 mg | ORAL_TABLET | Freq: Every day | ORAL | 0 refills | Status: DC

## 2018-05-12 MED ORDER — MORPHINE SULFATE ER 15 MG PO TBCR: 15.0000 mg | EXTENDED_RELEASE_TABLET | Freq: Two times a day (BID) | ORAL | 0 refills | Status: DC

## 2018-05-15 ENCOUNTER — Encounter: Payer: Self-pay | Admitting: Internal Medicine

## 2018-05-15 NOTE — Assessment & Plan Note (Signed)
No reported problems; continue Protonix 40 mg daily 

## 2018-05-15 NOTE — Assessment & Plan Note (Signed)
Nurses report stability; patient at time complains that she is either having diarrhea or not having a bowel movement at all but she is very very forgetful; continue Amitiza 24 mcg daily

## 2018-05-15 NOTE — Assessment & Plan Note (Signed)
Controlled; continue Demadex 40 mg Tuesday Thursday Saturday Sunday and 60 mg Monday Wednesday Friday

## 2018-05-31 LAB — BASIC METABOLIC PANEL
BUN: 38 — AB (ref 4–21)
Creatinine: 1.1 (ref 0.5–1.1)
Glucose: 84
Potassium: 3.6 (ref 3.4–5.3)
SODIUM: 143 (ref 137–147)

## 2018-05-31 LAB — CBC AND DIFFERENTIAL
HEMATOCRIT: 24 — AB (ref 36–46)
Hemoglobin: 8.4 — AB (ref 12.0–16.0)
PLATELETS: 300 (ref 150–399)
WBC: 7.6

## 2018-06-04 ENCOUNTER — Encounter: Payer: Self-pay | Admitting: Internal Medicine

## 2018-06-04 ENCOUNTER — Non-Acute Institutional Stay (SKILLED_NURSING_FACILITY): Payer: Medicare Other | Admitting: Internal Medicine

## 2018-06-04 DIAGNOSIS — I5032 Chronic diastolic (congestive) heart failure: Secondary | ICD-10-CM | POA: Diagnosis not present

## 2018-06-04 DIAGNOSIS — R42 Dizziness and giddiness: Secondary | ICD-10-CM

## 2018-06-04 DIAGNOSIS — G2581 Restless legs syndrome: Secondary | ICD-10-CM

## 2018-06-04 NOTE — Progress Notes (Signed)
Location:  [facility] Room Number: 303D Place of Service:  SNF [phone])  Shelly Delaine. [name], MD  Patient Care Team: [name], MD as PCP - General (Internal Medicine) [name], MD as Consulting Physician (Orthopedic Surgery) [name], MD as Consulting Physician (Otolaryngology) Mast, Man X, NP as Nurse Practitioner (Internal Medicine)  Extended Emergency Contact Information Primary Emergency Contact: [name] Address: [place], [place] [address] [name] of [place] Phone: [phone] Relation: Daughter Secondary Emergency Contact: Sherrill, [place] of [place] Mobile Phone: [phone] Relation: Son    Allergies: Amoxicillin; Baclofen; Hctz [hydrochlorothiazide]; and Valium [diazepam]  Chief Complaint  Patient presents with  . Medical Management of Chronic Issues    Routine Visit    HPI: Patient is 85 y.o. female who is being seen for routine issues of vertigo, restless leg syndrome and chronic diastolic congestive heart failure.  Past Medical History:  Diagnosis Date  . Abnormal stress test 08/26/2013  . Acute encephalopathy   . AKI (acute kidney injury) ([place])   . Altered mental state   . Anemia   . Anxiety disorder, unspecified   . Aortic stenosis   . Arthritis    Back, knees  . Avascular necrosis of bone of right hip ([place]) 09/27/2013  . Bowel incontinence   . BPPV (benign paroxysmal positional vertigo) 09/07/2013  . Closed fracture of pubic ramus ([place]) 09/07/2013  . Colon polyps   . Constipation   . Decreased hearing of both ears 08/19/2016   Last Assessment & Plan:  Concern over hearing loss. Slowly progressive hearing loss.  No history of ear surgery, trauma or infection. EXAMINATION shows normal external canals and tympanic membranes. AUDIOGRAM severe sensorineural hearing loss.  Symmetric in nature. PLAN: Reassured I do not see anything  bad going on.  She is a candidate for hearing aids.   will check her benefits.  . Gastric ulcer    years ago  . History of anemia 04/17/2012  . History of TIAs 04/17/2012   June 2011   . Hypertension   . Hypokalemia   . Pelvic fracture ([place]) 08/28/2013  . Personal history of colonic polyps 10/08/2011   Index polypectomy 2013   . Restless legs syndrome    on Requip  . S/P total hysterectomy and bilateral salpingo-oophorectomy 04/17/2012  . Situational stress   . Spinal stenosis   . Spinal stenosis, lumbar 04/17/2012  . Spinal stenosis, lumbar region, with neurogenic claudication 09/07/2013  . Stroke ([place])    Mini, no residual  . TIA (transient ischemic attack)   . Ulcer causing bleeding and hole in wall of stomach or small intestine   . Urinary bladder calculus   . Urinary incontinence   . Vertigo     Past Surgical History:  Procedure Laterality Date  . COLON RESECTION  50 years ago  . LUMBAR LAMINECTOMY/DECOMPRESSION MICRODISCECTOMY  06/25/2011   Procedure: LUMBAR LAMINECTOMY/DECOMPRESSION MICRODISCECTOMY;  Surgeon: Hosie Spangle, MD;  Location: [facility];  Service: Neurosurgery;  Laterality: Right;  RIGHT Lumbar Two-Three hemilaminectomy and microdiskectomy  . TONSILLECTOMY    . TOTAL ABDOMINAL HYSTERECTOMY W/ BILATERAL SALPINGOOPHORECTOMY  04/17/2012  . UTERINE FIBROID SURGERY      Allergies as of 06/04/2018      Reactions   Amoxicillin Other (See Comments)   Reaction unknown   Baclofen  Other (See Comments)   "fuzzy in the eyes" and dizzy   Hctz [hydrochlorothiazide]    nausea   Valium [diazepam] Other (See Comments)   Pt became unresponsive and O2 sats dropped.      Medication List       Accurate as of June 04, 2018 11:59 PM. Always use your most recent med list.        ferrous sulfate 325 (65 FE) MG tablet Take 325 mg by mouth 2 (two) times daily.   lubiprostone 24 MCG capsule Commonly known as:  AMITIZA Take 24 mcg by mouth 2 (two) times daily  with a meal.   Melatonin 3 MG Tabs Take 1 tablet by mouth at bedtime.   mirtazapine 15 MG tablet Commonly known as:  REMERON Take 15 mg by mouth at bedtime.   morphine 15 MG 12 hr tablet Commonly known as:  MS CONTIN Take 1 tablet (15 mg total) by mouth 2 (two) times daily.   multivitamin tablet Take 1 tablet by mouth daily.   MYRBETRIQ 50 MG Tb24 tablet Generic drug:  mirabegron ER Take 50 mg by mouth daily. bladder   ondansetron 4 MG tablet Commonly known as:  ZOFRAN Take 4 mg by mouth every 12 (twelve) hours as needed for nausea or vomiting.   oxyCODONE 5 MG immediate release tablet Commonly known as:  Oxy IR/ROXICODONE Take 1 tablet (5 mg total) by mouth daily. At 12 pm   potassium chloride 10 MEQ tablet Commonly known as:  K-DUR Take 20 mEq by mouth daily. 2 tablets   PROTONIX 40 MG tablet Generic drug:  pantoprazole Take 40 mg by mouth daily.   rOPINIRole 4 MG tablet Commonly known as:  REQUIP Take 4 mg by mouth at bedtime.   SYSTANE BALANCE 0.6 % Soln Generic drug:  Propylene Glycol Place 1 drop into both eyes 2 (two) times daily.   torsemide 20 MG tablet Commonly known as:  DEMADEX Take 40 mg by mouth every Tuesday, Thursday, Saturday, and Sunday.   torsemide 20 MG tablet Commonly known as:  DEMADEX Take 60 mg by mouth every Monday, Wednesday, and Friday. 3 tabs   Vitamin D3 1.25 MG (50000 UT) Caps Take 1 capsule by mouth every 30 (thirty) days.       No orders of the defined types were placed in this encounter.   Immunization History  Administered Date(s) Administered  . Influenza Split 04/15/2012  . Influenza,inj,Quad PF,6+ Mos 03/06/2014  . Influenza-Unspecified 03/01/2016, 05/01/2017  . PPD Test 08/30/2014, 09/18/2014  . Pneumococcal Conjugate-13 09/25/2017  . Pneumococcal Polysaccharide-23 08/29/2013    Social History   Tobacco Use  . Smoking status: Never Smoker  . Smokeless tobacco: Never Used  Substance Use Topics  . Alcohol  use: Yes    Alcohol/week: 0.0 standard drinks    Comment: rarely    Review of Systems  DATA OBTAINED: from patient-limited for meaningful; nursing-no acute concerns GENERAL:  no fevers, fatigue, appetite changes SKIN: No itching, rash HEENT: No complaint RESPIRATORY: No cough, wheezing, SOB CARDIAC: No chest pain, palpitations, lower extremity edema  GI: No abdominal pain, No N/V/D or constipation, No heartburn or reflux  GU: No dysuria, frequency or urgency, or incontinence  MUSCULOSKELETAL: No unrelieved bone/joint pain NEUROLOGIC: No headache, dizziness  PSYCHIATRIC: No overt anxiety or sadness  Vitals:   06/04/18 1443  BP: 124/66  Pulse: 74  Resp: (!) 22  Temp: 97.8 F (36.6 C)   Body mass index is 17.19 kg/m. Physical  Exam  GENERAL APPEARANCE: Alert, conversant, No acute distress  SKIN: No diaphoresis rash HEENT: Unremarkable RESPIRATORY: Breathing is even, unlabored. Lung sounds are clear   CARDIOVASCULAR: Heart RRR no murmurs, rubs or gallops. No peripheral edema  GASTROINTESTINAL: Abdomen is soft, non-tender, not distended w/ normal bowel sounds.  GENITOURINARY: Bladder non tender, not distended  MUSCULOSKELETAL: No abnormal joints or musculature NEUROLOGIC: Cranial nerves 2-12 grossly intact. Moves all extremities PSYCHIATRIC: Mood and affect with dementia, no behavioral issues  Patient Active Problem List   Diagnosis Date Noted  . Anxiety disorder, unspecified 07/23/2017  . Iron deficiency anemia 03/29/2017  . GERD (gastroesophageal reflux disease) 12/22/2016  . Decreased hearing of both ears 08/19/2016  . Pressure injury of skin 08/15/2016  . Aspiration pneumonia ([place]) 08/14/2016  . Chronic diastolic CHF (congestive heart failure) ([place]) 08/14/2016  . Chronic right shoulder pain 02/09/2016  . Bilateral lower extremity edema 02/02/2016  . Ileitis 01/19/2016  . Pressure ulcer 01/09/2016  . SBO (small bowel obstruction) ([place]) 01/08/2016  . Narcotic  addiction ([place]) 05/15/2015  . Diarrhea 03/30/2015  . Acute pharyngitis 02/26/2015  . Osteoarthritis of right knee 02/22/2015  . Pain in joint, lower leg 10/31/2014  . Ulcer of sacral region, stage 4 ([place]) 04/15/2014  . Moderate aortic stenosis 04/14/2014  . Pre-syncope 04/14/2014  . Hypotension 04/14/2014  . Acute renal failure superimposed on stage 2 chronic kidney disease ([place]) 04/14/2014  . Anemia 04/14/2014  . Protein-calorie malnutrition, severe ([place]) 04/14/2014  . Altered mental status   . Dehydration 04/13/2014  . Altered mental state 04/13/2014  . Acute kidney injury ([place])   . Hypokalemia 03/25/2014  . Syncope 03/16/2014  . Acute encephalopathy 03/16/2014  . Leg swelling 03/02/2014  . Avascular necrosis of bone of right hip ([place]) 09/27/2013  . Primary osteoarthritis of right hip 09/07/2013  . Spinal stenosis, lumbar region, with neurogenic claudication 09/07/2013  . Closed fracture of pubic ramus ([place]) 09/07/2013  . BPPV (benign paroxysmal positional vertigo) 09/07/2013  . Pharyngeal swelling 08/30/2013  . Pelvic fracture ([place]) 08/28/2013  . Fall 08/26/2013  . Dizziness 08/26/2013  . Abnormal stress test 08/26/2013  . Preop cardiovascular exam 08/01/2013  . Aortic stenosis 08/01/2013  . Edema 06/30/2013  . Loss of weight 01/04/2013  . Situational stress 01/04/2013  . Chronic right hip pain 07/05/2012  . Knee pain, acute 06/29/2012  . Vertigo 06/24/2012  . Acute exacerbation of chronic low back pain 06/24/2012  . Lumbar pain with radiation down right leg 06/09/2012  . Osteoarthritis of hip 04/29/2012  . History of TIAs 04/17/2012  . S/P total hysterectomy and bilateral salpingo-oophorectomy 04/17/2012  . Hard of hearing 04/17/2012  . History of anemia 04/17/2012  . RLS (restless legs syndrome) 04/17/2012  . Chronic back pain 04/17/2012  . Spinal stenosis, lumbar 04/17/2012  . Lower back pain 12/20/2011  . Personal history of colonic polyps 10/08/2011  . H/O  09/14/2011  . Essential hypertension 09/14/2011  . Restless legs syndrome 09/14/2011  . Constipation 08/20/2011  . Urinary incontinence 08/20/2011    CMP     Component Value Date/Time   NA 143 05/31/2018   K 3.6 05/31/2018   CL 106 08/17/2016 0343   CO2 28 08/17/2016 0343   GLUCOSE 92 08/17/2016 0343   BUN 38 (A) 05/31/2018   CREATININE 1.1 05/31/2018   CREATININE 1.13 (H) 08/17/2016 0343   CREATININE 0.78 06/28/2014 1205   CALCIUM 7.8 (L) 08/17/2016 0343   PROT 6.6 08/14/2016 1519   ALBUMIN 3.4 (L)  08/14/2016 1519   AST 14 03/01/2018   ALT 7 03/01/2018   ALKPHOS 94 03/01/2018   BILITOT 1.1 08/14/2016 1519   GFRNONAA 40 (L) 08/17/2016 0343   GFRAA 46 (L) 08/17/2016 0343   Recent Labs    03/01/18 05/31/18  NA 140 143  K 3.9 3.6  BUN 38* 38*  CREATININE 1.0 1.1   Recent Labs    03/01/18  AST 14  ALT 7  ALKPHOS 94   Recent Labs    03/01/18 05/31/18  WBC 8.1 7.6  HGB 8.4* 8.4*  HCT 25* 24*  PLT 285 300   Recent Labs    03/01/18  CHOL 128  LDLCALC 65  TRIG 65   No results found for: [facility] Lab Results  Component Value Date   TSH 2.02 03/01/2018   Lab Results  Component Value Date   HGBA1C 6.3 03/01/2018   Lab Results  Component Value Date   CHOL 128 03/01/2018   HDL 50 03/01/2018   LDLCALC 65 03/01/2018   TRIG 65 03/01/2018   CHOLHDL 2.8 03/06/2014    Significant Diagnostic Results in last 30 days:  No results found.  Assessment and Plan  Vertigo Is been a long time since patient complained of this or this was reported; will continue to monitor  RLS (restless legs syndrome) No reported complaints; continue Requip 4 mg nightly  Chronic diastolic CHF (congestive heart failure) ([place]) Chronic and stable without exacerbation; continue Demadex 40 mg Tuesday Thursday Saturday Sunday and 60 mg Monday Wednesday Friday     Anne D. [name], MD

## 2018-06-05 ENCOUNTER — Encounter: Payer: Self-pay | Admitting: Internal Medicine

## 2018-06-05 NOTE — Assessment & Plan Note (Signed)
Is been a long time since patient complained of this or this was reported; will continue to monitor

## 2018-06-05 NOTE — Assessment & Plan Note (Signed)
No reported complaints; continue Requip 4 mg nightly

## 2018-06-05 NOTE — Assessment & Plan Note (Signed)
Chronic and stable without exacerbation; continue Demadex 40 mg Tuesday Thursday Saturday Sunday and 60 mg Monday Wednesday Friday

## 2018-06-08 ENCOUNTER — Other Ambulatory Visit: Payer: Self-pay

## 2018-07-05 ENCOUNTER — Encounter: Payer: Self-pay | Admitting: Internal Medicine

## 2018-07-05 ENCOUNTER — Non-Acute Institutional Stay (SKILLED_NURSING_FACILITY): Payer: Medicare Other | Admitting: Internal Medicine

## 2018-07-05 DIAGNOSIS — M160 Bilateral primary osteoarthritis of hip: Secondary | ICD-10-CM | POA: Diagnosis not present

## 2018-07-05 DIAGNOSIS — M87051 Idiopathic aseptic necrosis of right femur: Secondary | ICD-10-CM | POA: Diagnosis not present

## 2018-07-05 DIAGNOSIS — G2581 Restless legs syndrome: Secondary | ICD-10-CM

## 2018-07-05 NOTE — Progress Notes (Signed)
Location:  [facility] Room Number: [place] of Service:  SNF ([id number])  [name], MD  Patient Care Team: [name], MD as PCP - General (Internal Medicine) [name], MD as Consulting Physician (Orthopedic Surgery) [name], MD as Consulting Physician (Otolaryngology) Mast, Man X, NP as Nurse Practitioner (Internal Medicine)  Extended Emergency Contact Information Primary Emergency Contact: [name] Address: [place], [place] [address] [name] of  Phone: [phone] Relation: Daughter Secondary Emergency Contact: [place], [place] of [place] Mobile Phone: [phone] Relation: Son    Allergies: Amoxicillin; Baclofen; Hctz [hydrochlorothiazide]; and Valium [diazepam]  Chief Complaint  Patient presents with  . Medical Management of Chronic Issues    Routine Visit    HPI: Patient is 85 y.o. female who is being seen for routine issues of osteoarthritis of hip, aseptic necrosis of the right hip and restless leg syndrome.  Past Medical History:  Diagnosis Date  . Abnormal stress test 08/26/2013  . Acute encephalopathy   . AKI (acute kidney injury) ([place])   . Altered mental state   . Anemia   . Anxiety disorder, unspecified   . Aortic stenosis   . Arthritis    Back, knees  . Avascular necrosis of bone of right hip ([place]) 09/27/2013  . Bowel incontinence   . BPPV (benign paroxysmal positional vertigo) 09/07/2013  . Closed fracture of pubic ramus ([place]) 09/07/2013  . Colon polyps   . Constipation   . Decreased hearing of both ears 08/19/2016   Last Assessment & Plan:  Concern over hearing loss. Slowly progressive hearing loss.  No history of ear surgery, trauma or infection. EXAMINATION shows normal external canals and tympanic membranes. AUDIOGRAM severe sensorineural hearing loss.  Symmetric in nature. PLAN: Reassured I do not see  anything bad going on.  She is a candidate for hearing aids.   will check her benefits.  . Gastric ulcer    years ago  . History of anemia 04/17/2012  . History of TIAs 04/17/2012   June 2011   . Hypertension   . Hypokalemia   . Pelvic fracture ([place]) 08/28/2013  . Personal history of colonic polyps 10/08/2011   Index polypectomy 2013   . Restless legs syndrome    on Requip  . S/P total hysterectomy and bilateral salpingo-oophorectomy 04/17/2012  . Situational stress   . Spinal stenosis   . Spinal stenosis, lumbar 04/17/2012  . Spinal stenosis, lumbar region, with neurogenic claudication 09/07/2013  . Stroke ([place])    Mini, no residual  . TIA (transient ischemic attack)   . Ulcer causing bleeding and hole in wall of stomach or small intestine   . Urinary bladder calculus   . Urinary incontinence   . Vertigo     Past Surgical History:  Procedure Laterality Date  . COLON RESECTION  50 years ago  . LUMBAR LAMINECTOMY/DECOMPRESSION MICRODISCECTOMY  06/25/2011   Procedure: LUMBAR LAMINECTOMY/DECOMPRESSION MICRODISCECTOMY;  Surgeon: Hosie Spangle, MD;  Location: [facility];  Service: Neurosurgery;  Laterality: Right;  RIGHT Lumbar Two-Three hemilaminectomy and microdiskectomy  . TONSILLECTOMY    . TOTAL ABDOMINAL HYSTERECTOMY W/ BILATERAL SALPINGOOPHORECTOMY  04/17/2012  . UTERINE FIBROID SURGERY      Allergies as of 07/05/2018      Reactions   Amoxicillin Other (See Comments)   Reaction  unknown   Baclofen Other (See Comments)   "fuzzy in the eyes" and dizzy   Hctz [hydrochlorothiazide]    nausea   Valium [diazepam] Other (See Comments)   Pt became unresponsive and O2 sats dropped.      Medication List       Accurate as of July 05, 2018 11:59 PM. Always use your most recent med list.        calcium carbonate 500 MG chewable tablet Commonly known as:  TUMS - dosed in mg elemental calcium Chew 1 tablet by mouth 3 (three) times daily with meals.   ferrous sulfate  325 (65 FE) MG tablet Take 325 mg by mouth 2 (two) times daily.   lubiprostone 24 MCG capsule Commonly known as:  AMITIZA Take 24 mcg by mouth 2 (two) times daily with a meal.   Melatonin 3 MG Tabs Take 1 tablet by mouth at bedtime.   mirtazapine 7.5 MG tablet Commonly known as:  REMERON Take 7.5 mg by mouth at bedtime.   morphine 15 MG 12 hr tablet Commonly known as:  MS CONTIN Take 1 tablet (15 mg total) by mouth 2 (two) times daily.   multivitamin tablet Take 1 tablet by mouth daily.   MYRBETRIQ 50 MG Tb24 tablet Generic drug:  mirabegron ER Take 50 mg by mouth daily. bladder   ondansetron 4 MG tablet Commonly known as:  ZOFRAN Take 4 mg by mouth every 12 (twelve) hours as needed for nausea or vomiting.   oxyCODONE 5 MG immediate release tablet Commonly known as:  Oxy IR/ROXICODONE Take 1 tablet (5 mg total) by mouth daily. At 12 pm   potassium chloride 10 MEQ tablet Commonly known as:  K-DUR Take 20 mEq by mouth daily. 2 tablets   PROTONIX 40 MG tablet Generic drug:  pantoprazole Take 40 mg by mouth daily.   rOPINIRole 4 MG tablet Commonly known as:  REQUIP Take 4 mg by mouth at bedtime.   SYSTANE BALANCE 0.6 % Soln Generic drug:  Propylene Glycol Place 1 drop into both eyes 2 (two) times daily.   torsemide 20 MG tablet Commonly known as:  DEMADEX Take 40 mg by mouth every Tuesday, Thursday, Saturday, and Sunday.   torsemide 20 MG tablet Commonly known as:  DEMADEX Take 60 mg by mouth every Monday, Wednesday, and Friday. 3 tabs   Vitamin D3 1.25 MG (50000 UT) Caps Take 1 capsule by mouth every 30 (thirty) days.       No orders of the defined types were placed in this encounter.   Immunization History  Administered Date(s) Administered  . Influenza Split 04/15/2012  . Influenza,inj,Quad PF,6+ Mos 03/06/2014  . Influenza-Unspecified 03/01/2016, 05/01/2017  . PPD Test 08/30/2014, 09/18/2014  . Pneumococcal Conjugate-13 09/25/2017  .  Pneumococcal Polysaccharide-23 08/29/2013    Social History   Tobacco Use  . Smoking status: Never Smoker  . Smokeless tobacco: Never Used  Substance Use Topics  . Alcohol use: Yes    Alcohol/week: 0.0 standard drinks    Comment: rarely    Review of Systems  DATA OBTAINED: from patient-limited; nursing no acute concerns GENERAL:  no fevers, fatigue, appetite changes SKIN: No itching, rash HEENT: No complaint RESPIRATORY: No cough, wheezing, SOB CARDIAC: No chest pain, palpitations, lower extremity edema  GI: No abdominal pain, No N/V/D or constipation, No heartburn or reflux  GU: No dysuria, frequency or urgency, or incontinence  MUSCULOSKELETAL: No unrelieved bone/joint pain NEUROLOGIC: No headache, dizziness  PSYCHIATRIC: No overt anxiety  or sadness  Vitals:   07/05/18 1127  BP: 122/68  Pulse: 70  Resp: 17  Temp: 98.4 F (36.9 C)   Body mass index is 16.8 kg/m. Physical Exam  GENERAL APPEARANCE: Alert, conversant, No acute distress  SKIN: No diaphoresis rash HEENT: Unremarkable RESPIRATORY: Breathing is even, unlabored. Lung sounds are clear   CARDIOVASCULAR: Heart RRR no murmurs, rubs or gallops. No peripheral edema  GASTROINTESTINAL: Abdomen is soft, non-tender, not distended w/ normal bowel sounds.  GENITOURINARY: Bladder non tender, not distended  MUSCULOSKELETAL: No abnormal joints or musculature NEUROLOGIC: Cranial nerves 2-12 grossly intact. Moves all extremities PSYCHIATRIC: Dementia has progressed to the point that patient does not worry as much about her health that she used to, no behavioral issues  Patient Active Problem List   Diagnosis Date Noted  . Anxiety disorder, unspecified 07/23/2017  . Iron deficiency anemia 03/29/2017  . GERD (gastroesophageal reflux disease) 12/22/2016  . Decreased hearing of both ears 08/19/2016  . Pressure injury of skin 08/15/2016  . Aspiration pneumonia ([place]) 08/14/2016  . Chronic diastolic CHF (congestive heart  failure) ([place]) 08/14/2016  . Chronic right shoulder pain 02/09/2016  . Bilateral lower extremity edema 02/02/2016  . Ileitis 01/19/2016  . Pressure ulcer 01/09/2016  . SBO (small bowel obstruction) ([place]) 01/08/2016  . Narcotic addiction ([place]) 05/15/2015  . Diarrhea 03/30/2015  . Acute pharyngitis 02/26/2015  . Osteoarthritis of right knee 02/22/2015  . Pain in joint, lower leg 10/31/2014  . Ulcer of sacral region, stage 4 ([place]) 04/15/2014  . Moderate aortic stenosis 04/14/2014  . Pre-syncope 04/14/2014  . Hypotension 04/14/2014  . Acute renal failure superimposed on stage 2 chronic kidney disease ([place]) 04/14/2014  . Anemia 04/14/2014  . Protein-calorie malnutrition, severe ([place]) 04/14/2014  . Altered mental status   . Dehydration 04/13/2014  . Altered mental state 04/13/2014  . Acute kidney injury ([place])   . Hypokalemia 03/25/2014  . Syncope 03/16/2014  . Acute encephalopathy 03/16/2014  . Leg swelling 03/02/2014  . Avascular necrosis of bone of right hip ([place]) 09/27/2013  . Primary osteoarthritis of right hip 09/07/2013  . Spinal stenosis, lumbar region, with neurogenic claudication 09/07/2013  . Closed fracture of pubic ramus ([place]) 09/07/2013  . BPPV (benign paroxysmal positional vertigo) 09/07/2013  . Pharyngeal swelling 08/30/2013  . Pelvic fracture ([place]) 08/28/2013  . Fall 08/26/2013  . Dizziness 08/26/2013  . Abnormal stress test 08/26/2013  . Preop cardiovascular exam 08/01/2013  . Aortic stenosis 08/01/2013  . Edema 06/30/2013  . Loss of weight 01/04/2013  . Situational stress 01/04/2013  . Chronic right hip pain 07/05/2012  . Knee pain, acute 06/29/2012  . Vertigo 06/24/2012  . Acute exacerbation of chronic low back pain 06/24/2012  . Lumbar pain with radiation down right leg 06/09/2012  . Osteoarthritis of hip 04/29/2012  . History of TIAs 04/17/2012  . S/P total hysterectomy and bilateral salpingo-oophorectomy 04/17/2012  . Hard of hearing 04/17/2012  .  History of anemia 04/17/2012  . RLS (restless legs syndrome) 04/17/2012  . Chronic back pain 04/17/2012  . Spinal stenosis, lumbar 04/17/2012  . Lower back pain 12/20/2011  . Personal history of colonic polyps 10/08/2011  . H/O 09/14/2011  . Essential hypertension 09/14/2011  . Restless legs syndrome 09/14/2011  . Constipation 08/20/2011  . Urinary incontinence 08/20/2011    CMP     Component Value Date/Time   NA 143 05/31/2018   K 3.6 05/31/2018   CL 106 08/17/2016 0343   CO2 28 08/17/2016 0343  GLUCOSE 92 08/17/2016 0343   BUN 38 (A) 05/31/2018   CREATININE 1.1 05/31/2018   CREATININE 1.13 (H) 08/17/2016 0343   CREATININE 0.78 06/28/2014 1205   CALCIUM 7.8 (L) 08/17/2016 0343   PROT 6.6 08/14/2016 1519   ALBUMIN 3.4 (L) 08/14/2016 1519   AST 14 03/01/2018   ALT 7 03/01/2018   ALKPHOS 94 03/01/2018   BILITOT 1.1 08/14/2016 1519   GFRNONAA 40 (L) 08/17/2016 0343   GFRAA 46 (L) 08/17/2016 0343   Recent Labs    03/01/18 05/31/18  NA 140 143  K 3.9 3.6  BUN 38* 38*  CREATININE 1.0 1.1   Recent Labs    03/01/18  AST 14  ALT 7  ALKPHOS 94   Recent Labs    03/01/18 05/31/18  WBC 8.1 7.6  HGB 8.4* 8.4*  HCT 25* 24*  PLT 285 300   Recent Labs    03/01/18  CHOL 128  LDLCALC 65  TRIG 65   No results found for: [facility] Lab Results  Component Value Date   TSH 2.02 03/01/2018   Lab Results  Component Value Date   HGBA1C 6.3 03/01/2018   Lab Results  Component Value Date   CHOL 128 03/01/2018   HDL 50 03/01/2018   LDLCALC 65 03/01/2018   TRIG 65 03/01/2018   CHOLHDL 2.8 03/06/2014    Significant Diagnostic Results in last 30 days:  No results found.  Assessment and Plan  Osteoarthritis of hip Patient with necrosis of the femoral head; pain regimen is controlled by pain clinic; continue MS Contin 15 mg twice daily and oxycodone 5 mg 1 daily  Avascular necrosis of bone of right hip Stable and chronic; patient is treated with MS Contin 15 mg  twice daily and 1 tablet of oxycodone 5 mg daily per pain clinic  Restless legs syndrome No reported problems; continue Requip 4 mg nightly     Anne D. [name], MD

## 2018-07-06 ENCOUNTER — Encounter: Payer: Self-pay | Admitting: Internal Medicine

## 2018-07-06 NOTE — Assessment & Plan Note (Signed)
Stable and chronic; patient is treated with MS Contin 15 mg twice daily and 1 tablet of oxycodone 5 mg daily per pain clinic

## 2018-07-06 NOTE — Assessment & Plan Note (Signed)
Patient with necrosis of the femoral head; pain regimen is controlled by pain clinic; continue MS Contin 15 mg twice daily and oxycodone 5 mg 1 daily

## 2018-07-06 NOTE — Assessment & Plan Note (Signed)
No reported problems; continue Requip 4 mg nightly

## 2018-07-07 ENCOUNTER — Other Ambulatory Visit: Payer: Self-pay

## 2018-07-07 MED ORDER — MORPHINE SULFATE ER 15 MG PO TBCR: 15.0000 mg | EXTENDED_RELEASE_TABLET | Freq: Two times a day (BID) | ORAL | 0 refills | Status: DC

## 2018-07-12 ENCOUNTER — Other Ambulatory Visit: Payer: Self-pay | Admitting: Internal Medicine

## 2018-07-13 ENCOUNTER — Other Ambulatory Visit: Payer: Self-pay

## 2018-07-13 MED ORDER — OXYCODONE HCL 5 MG PO TABS: 5.0000 mg | ORAL_TABLET | Freq: Every day | ORAL | 0 refills | Status: DC

## 2018-07-29 ENCOUNTER — Encounter: Payer: Self-pay | Admitting: Internal Medicine

## 2018-07-29 DIAGNOSIS — F0390 Unspecified dementia without behavioral disturbance: Secondary | ICD-10-CM | POA: Insufficient documentation

## 2018-08-03 ENCOUNTER — Encounter: Payer: Self-pay | Admitting: Internal Medicine

## 2018-08-03 ENCOUNTER — Non-Acute Institutional Stay (SKILLED_NURSING_FACILITY): Payer: Medicare Other | Admitting: Internal Medicine

## 2018-08-03 DIAGNOSIS — I10 Essential (primary) hypertension: Secondary | ICD-10-CM

## 2018-08-03 DIAGNOSIS — G301 Alzheimer's disease with late onset: Secondary | ICD-10-CM

## 2018-08-03 DIAGNOSIS — F0280 Dementia in other diseases classified elsewhere without behavioral disturbance: Secondary | ICD-10-CM | POA: Diagnosis not present

## 2018-08-03 DIAGNOSIS — K2100 Gastro-esophageal reflux disease with esophagitis, without bleeding: Secondary | ICD-10-CM

## 2018-08-03 NOTE — Progress Notes (Signed)
Location:  [facility] Room Number: 303D Place of Service:  SNF [phone])  [name], MD  Patient Care Team: [name], MD as PCP - General (Internal Medicine) [name], MD as Consulting Physician (Orthopedic Surgery) [name], MD as Consulting Physician (Otolaryngology) Mast, Man X, NP as Nurse Practitioner (Internal Medicine)  Extended Emergency Contact Information Primary Emergency Contact: [name] Address: [place],  [address] [name] of [place] Phone: [phone] Relation: Daughter Secondary Emergency Contact: [place], [place] of [place] Mobile Phone: [phone] Relation: Son    Allergies: Amoxicillin; Baclofen; Hctz [hydrochlorothiazide]; and Valium [diazepam]  Chief Complaint  Patient presents with  . Medical Management of Chronic Issues    Routine visit    HPI: Patient is 85 y.o. female who is being seen for routine issues of hypertension, GERD and dementia.  Past Medical History:  Diagnosis Date  . Abnormal stress test 08/26/2013  . Acute encephalopathy   . AKI (acute kidney injury) ([place])   . Altered mental state   . Anemia   . Anxiety disorder, unspecified   . Aortic stenosis   . Arthritis    Back, knees  . Avascular necrosis of bone of right hip ([place]) 09/27/2013  . Bowel incontinence   . BPPV (benign paroxysmal positional vertigo) 09/07/2013  . Closed fracture of pubic ramus ([place]) 09/07/2013  . Colon polyps   . Constipation   . Decreased hearing of both ears 08/19/2016   Last Assessment & Plan:  Concern over hearing loss. Slowly progressive hearing loss.  No history of ear surgery, trauma or infection. EXAMINATION shows normal external canals and tympanic membranes. AUDIOGRAM severe sensorineural hearing loss.  Symmetric in nature. PLAN: Reassured I do not see anything bad going on.  She is a candidate for hearing  aids.   will check her benefits.  . Gastric ulcer    years ago  . History of anemia 04/17/2012  . History of TIAs 04/17/2012   June 2011   . Hypertension   . Hypokalemia   . Pelvic fracture ([place]) 08/28/2013  . Personal history of colonic polyps 10/08/2011   Index polypectomy 2013   . Restless legs syndrome    on Requip  . S/P total hysterectomy and bilateral salpingo-oophorectomy 04/17/2012  . Situational stress   . Spinal stenosis   . Spinal stenosis, lumbar 04/17/2012  . Spinal stenosis, lumbar region, with neurogenic claudication 09/07/2013  . Stroke ([place])    Mini, no residual  . TIA (transient ischemic attack)   . Ulcer causing bleeding and hole in wall of stomach or small intestine   . Urinary bladder calculus   . Urinary incontinence   . Vertigo     Past Surgical History:  Procedure Laterality Date  . COLON RESECTION  50 years ago  . LUMBAR LAMINECTOMY/DECOMPRESSION MICRODISCECTOMY  06/25/2011   Procedure: LUMBAR LAMINECTOMY/DECOMPRESSION MICRODISCECTOMY;  Surgeon: Hosie Spangle, MD;  Location: [facility];  Service: Neurosurgery;  Laterality: Right;  RIGHT Lumbar Two-Three hemilaminectomy and microdiskectomy  . TONSILLECTOMY    . TOTAL ABDOMINAL HYSTERECTOMY W/ BILATERAL SALPINGOOPHORECTOMY  04/17/2012  . UTERINE FIBROID SURGERY      Allergies as of 08/03/2018      Reactions   Amoxicillin Other (See Comments)   Reaction unknown   Baclofen Other (See Comments)   "fuzzy  in the eyes" and dizzy   Hctz [hydrochlorothiazide]    nausea   Valium [diazepam] Other (See Comments)   Pt became unresponsive and O2 sats dropped.      Medication List       Accurate as of August 03, 2018 11:59 PM. Always use your most recent med list.        calcium carbonate 500 MG chewable tablet Commonly known as:  TUMS - dosed in mg elemental calcium Chew 1 tablet by mouth 3 (three) times daily with meals.   ferrous sulfate 325 (65 FE) MG tablet Take 325 mg by mouth 2 (two) times  daily.   lubiprostone 24 MCG capsule Commonly known as:  AMITIZA Take 24 mcg by mouth 2 (two) times daily with a meal.   Melatonin 3 MG Tabs Take 1 tablet by mouth at bedtime.   mirtazapine 7.5 MG tablet Commonly known as:  REMERON Take 7.5 mg by mouth at bedtime.   morphine 15 MG 12 hr tablet Commonly known as:  MS CONTIN Take 1 tablet (15 mg total) by mouth 2 (two) times daily.   multivitamin tablet Take 1 tablet by mouth daily.   Myrbetriq 50 MG Tb24 tablet Generic drug:  mirabegron ER Take 50 mg by mouth daily. bladder   ondansetron 4 MG tablet Commonly known as:  ZOFRAN Take 4 mg by mouth every 12 (twelve) hours as needed for nausea or vomiting.   oxyCODONE 5 MG immediate release tablet Commonly known as:  Oxy IR/ROXICODONE Take 1 tablet (5 mg total) by mouth daily. At 12 pm   potassium chloride 10 MEQ tablet Commonly known as:  K-DUR Take 20 mEq by mouth daily. 2 tablets   Protonix 40 MG tablet Generic drug:  pantoprazole Take 40 mg by mouth daily.   rOPINIRole 4 MG tablet Commonly known as:  REQUIP Take 4 mg by mouth at bedtime.   Systane Balance 0.6 % Soln Generic drug:  Propylene Glycol Place 1 drop into both eyes 2 (two) times daily.   torsemide 20 MG tablet Commonly known as:  DEMADEX Take 40 mg by mouth every Tuesday, Thursday, Saturday, and Sunday.   torsemide 20 MG tablet Commonly known as:  DEMADEX Take 60 mg by mouth every Monday, Wednesday, and Friday. 3 tabs   Vitamin D3 1.25 MG (50000 UT) Caps Take 1 capsule by mouth every 30 (thirty) days.       No orders of the defined types were placed in this encounter.   Immunization History  Administered Date(s) Administered  . Influenza Split 04/15/2012  . Influenza,inj,Quad PF,6+ Mos 03/06/2014  . Influenza-Unspecified 03/01/2016, 05/01/2017  . PPD Test 08/30/2014, 09/18/2014  . Pneumococcal Conjugate-13 09/25/2017  . Pneumococcal Polysaccharide-23 08/29/2013    Social History    Tobacco Use  . Smoking status: Never Smoker  . Smokeless tobacco: Never Used  Substance Use Topics  . Alcohol use: Yes    Alcohol/week: 0.0 standard drinks    Comment: rarely    Review of Systems  DATA OBTAINED: from nurse GENERAL:  no fevers, fatigue, appetite changes SKIN: No itching, rash HEENT: No complaint RESPIRATORY: No cough, wheezing, SOB CARDIAC: No chest pain, palpitations, lower extremity edema  GI: No abdominal pain, No N/V/D or constipation, No heartburn or reflux  GU: No dysuria, frequency or urgency, or incontinence  MUSCULOSKELETAL: No unrelieved bone/joint pain NEUROLOGIC: No headache, dizziness  PSYCHIATRIC: No overt anxiety or sadness  Vitals:   08/03/18 1246  BP: 123/64  Pulse: 84  Resp: (!) 21  Temp: 98.7 F (37.1 C)   Body mass index is 16.8 kg/m. Physical Exam  GENERAL APPEARANCE: Alert, conversant, No acute distress  SKIN: No diaphoresis rash HEENT: Unremarkable RESPIRATORY: Breathing is even, unlabored. Lung sounds are clear   CARDIOVASCULAR: Heart RRR no murmurs, rubs or gallops. No peripheral edema  GASTROINTESTINAL: Abdomen is soft, non-tender, not distended w/ normal bowel sounds.  GENITOURINARY: Bladder non tender, not distended  MUSCULOSKELETAL: No abnormal joints or musculature NEUROLOGIC: Cranial nerves 2-12 grossly intact. Moves all extremities PSYCHIATRIC: Mood and affect appropriate dementia, no behavioral issues  Patient Active Problem List   Diagnosis Date Noted  . Dementia without behavioral disturbance ([place]) 07/29/2018  . Anxiety disorder, unspecified 07/23/2017  . Iron deficiency anemia 03/29/2017  . GERD (gastroesophageal reflux disease) 12/22/2016  . Decreased hearing of both ears 08/19/2016  . Pressure injury of skin 08/15/2016  . Aspiration pneumonia ([place]) 08/14/2016  . Chronic diastolic CHF (congestive heart failure) ([place]) 08/14/2016  . Chronic right shoulder pain 02/09/2016  . Bilateral lower extremity edema  02/02/2016  . Ileitis 01/19/2016  . Pressure ulcer 01/09/2016  . SBO (small bowel obstruction) ([place]) 01/08/2016  . Narcotic addiction ([place]) 05/15/2015  . Diarrhea 03/30/2015  . Acute pharyngitis 02/26/2015  . Osteoarthritis of right knee 02/22/2015  . Pain in joint, lower leg 10/31/2014  . Ulcer of sacral region, stage 4 ([place]) 04/15/2014  . Moderate aortic stenosis 04/14/2014  . Pre-syncope 04/14/2014  . Hypotension 04/14/2014  . Acute renal failure superimposed on stage 2 chronic kidney disease ([place]) 04/14/2014  . Anemia 04/14/2014  . Protein-calorie malnutrition, severe ([place]) 04/14/2014  . Altered mental status   . Dehydration 04/13/2014  . Altered mental state 04/13/2014  . Acute kidney injury ([place])   . Hypokalemia 03/25/2014  . Syncope 03/16/2014  . Acute encephalopathy 03/16/2014  . Leg swelling 03/02/2014  . Avascular necrosis of bone of right hip ([place]) 09/27/2013  . Primary osteoarthritis of right hip 09/07/2013  . Spinal stenosis, lumbar region, with neurogenic claudication 09/07/2013  . Closed fracture of pubic ramus ([place]) 09/07/2013  . BPPV (benign paroxysmal positional vertigo) 09/07/2013  . Pharyngeal swelling 08/30/2013  . Pelvic fracture ([place]) 08/28/2013  . Fall 08/26/2013  . Dizziness 08/26/2013  . Abnormal stress test 08/26/2013  . Preop cardiovascular exam 08/01/2013  . Aortic stenosis 08/01/2013  . Edema 06/30/2013  . Loss of weight 01/04/2013  . Situational stress 01/04/2013  . Chronic right hip pain 07/05/2012  . Knee pain, acute 06/29/2012  . Vertigo 06/24/2012  . Acute exacerbation of chronic low back pain 06/24/2012  . Lumbar pain with radiation down right leg 06/09/2012  . Osteoarthritis of hip 04/29/2012  . History of TIAs 04/17/2012  . S/P total hysterectomy and bilateral salpingo-oophorectomy 04/17/2012  . Hard of hearing 04/17/2012  . History of anemia 04/17/2012  . RLS (restless legs syndrome) 04/17/2012  . Chronic back pain 04/17/2012    . Spinal stenosis, lumbar 04/17/2012  . Lower back pain 12/20/2011  . Personal history of colonic polyps 10/08/2011  . H/O 09/14/2011  . Essential hypertension 09/14/2011  . Restless legs syndrome 09/14/2011  . Constipation 08/20/2011  . Urinary incontinence 08/20/2011    CMP     Component Value Date/Time   NA 143 05/31/2018   K 3.6 05/31/2018   CL 106 08/17/2016 0343   CO2 28 08/17/2016 0343   GLUCOSE 92 08/17/2016 0343   BUN 38 (A) 05/31/2018   CREATININE 1.1 05/31/2018   CREATININE 1.13 (  H) 08/17/2016 0343   CREATININE 0.78 06/28/2014 1205   CALCIUM 7.8 (L) 08/17/2016 0343   PROT 6.6 08/14/2016 1519   ALBUMIN 3.4 (L) 08/14/2016 1519   AST 14 03/01/2018   ALT 7 03/01/2018   ALKPHOS 94 03/01/2018   BILITOT 1.1 08/14/2016 1519   GFRNONAA 40 (L) 08/17/2016 0343   GFRAA 46 (L) 08/17/2016 0343   Recent Labs    03/01/18 05/31/18  NA 140 143  K 3.9 3.6  BUN 38* 38*  CREATININE 1.0 1.1   Recent Labs    03/01/18  AST 14  ALT 7  ALKPHOS 94   Recent Labs    03/01/18 05/31/18  WBC 8.1 7.6  HGB 8.4* 8.4*  HCT 25* 24*  PLT 285 300   Recent Labs    03/01/18  CHOL 128  LDLCALC 65  TRIG 65   No results found for: [facility] Lab Results  Component Value Date   TSH 2.02 03/01/2018   Lab Results  Component Value Date   HGBA1C 6.3 03/01/2018   Lab Results  Component Value Date   CHOL 128 03/01/2018   HDL 50 03/01/2018   LDLCALC 65 03/01/2018   TRIG 65 03/01/2018   CHOLHDL 2.8 03/06/2014    Significant Diagnostic Results in last 30 days:  No results found.  Assessment and Plan  Essential hypertension Controlled; continue Demadex 60 mg Monday Wednesday Friday and 40 mg all other days  GERD (gastroesophageal reflux disease) No reports of reflux or aspiration; continue Protonix 40 mg daily  Dementia without behavioral disturbance ([place]) Slowly progressing, but not yet failure to thrive; continue supportive care    Deyna [place] D. [name], MD

## 2018-08-06 ENCOUNTER — Other Ambulatory Visit: Payer: Self-pay

## 2018-08-07 ENCOUNTER — Encounter: Payer: Self-pay | Admitting: Internal Medicine

## 2018-08-07 NOTE — Assessment & Plan Note (Signed)
Controlled; continue Demadex 60 mg Monday Wednesday Friday and 40 mg all other days

## 2018-08-07 NOTE — Assessment & Plan Note (Signed)
No reports of reflux or aspiration; continue Protonix 40 mg daily 

## 2018-08-07 NOTE — Assessment & Plan Note (Signed)
Slowly progressing, but not yet failure to thrive; continue supportive care

## 2018-08-09 ENCOUNTER — Other Ambulatory Visit: Payer: Self-pay | Admitting: Internal Medicine

## 2018-08-09 MED ORDER — OXYCODONE HCL 5 MG PO TABS: 5.0000 mg | ORAL_TABLET | Freq: Every day | ORAL | 0 refills | Status: DC

## 2018-08-09 MED ORDER — TRAMADOL HCL 50 MG PO TABS: 50.0000 mg | ORAL_TABLET | Freq: Three times a day (TID) | ORAL | 0 refills | Status: DC | PRN

## 2018-08-12 ENCOUNTER — Other Ambulatory Visit: Payer: Self-pay | Admitting: Internal Medicine

## 2018-08-12 MED ORDER — MORPHINE SULFATE ER 15 MG PO TBCR: 15.0000 mg | EXTENDED_RELEASE_TABLET | Freq: Two times a day (BID) | ORAL | 0 refills | Status: DC

## 2018-09-09 ENCOUNTER — Other Ambulatory Visit: Payer: Self-pay | Admitting: Internal Medicine

## 2018-09-09 MED ORDER — MORPHINE SULFATE ER 15 MG PO TBCR: 15.0000 mg | EXTENDED_RELEASE_TABLET | Freq: Two times a day (BID) | ORAL | 0 refills | Status: DC

## 2018-09-15 ENCOUNTER — Non-Acute Institutional Stay (SKILLED_NURSING_FACILITY): Payer: Medicare Other | Admitting: Internal Medicine

## 2018-09-15 ENCOUNTER — Encounter: Payer: Self-pay | Admitting: Internal Medicine

## 2018-09-15 DIAGNOSIS — I350 Nonrheumatic aortic (valve) stenosis: Secondary | ICD-10-CM

## 2018-09-15 DIAGNOSIS — I5032 Chronic diastolic (congestive) heart failure: Secondary | ICD-10-CM

## 2018-09-15 DIAGNOSIS — K5901 Slow transit constipation: Secondary | ICD-10-CM

## 2018-09-15 NOTE — Progress Notes (Signed)
Location:  [facility] Room Number: 303D Place of Service:  SNF [phone])  Shelly Delaine. [name], MD  Patient Care Team: [name], MD as PCP - General (Internal Medicine) [name], MD as Consulting Physician (Orthopedic Surgery) [name], MD as Consulting Physician (Otolaryngology) Mast, Man X, NP as Nurse Practitioner (Internal Medicine)  Extended Emergency Contact Information Primary Emergency Contact: Shelly Martin Address: [place], [place] [address] [name] of [place] Phone: [phone] Relation: Daughter Secondary Emergency Contact: Shelly Martin of [place] Mobile Phone: [phone] Relation: Son    Allergies: Amoxicillin; Baclofen; Hctz [hydrochlorothiazide]; and Valium [diazepam]  Chief Complaint  Patient presents with  . Medical Management of Chronic Issues    Routine visit    HPI: Patient is 86 y.o. female who is being seen for routine issues of aortic stenosis, chronic diastolic congestive heart failure, and constipation.  Past Medical History:  Diagnosis Date  . Abnormal stress test 08/26/2013  . Acute encephalopathy   . AKI (acute kidney injury) ([place])   . Altered mental state   . Anemia   . Anxiety disorder, unspecified   . Aortic stenosis   . Arthritis    Back, knees  . Avascular necrosis of bone of right hip ([place]) 09/27/2013  . Bowel incontinence   . BPPV (benign paroxysmal positional vertigo) 09/07/2013  . Closed fracture of pubic ramus ([place]) 09/07/2013  . Colon polyps   . Constipation   . Decreased hearing of both ears 08/19/2016   Last Assessment & Plan:  Concern over hearing loss. Slowly progressive hearing loss.  No history of ear surgery, trauma or infection. EXAMINATION shows normal external canals and tympanic membranes. AUDIOGRAM severe sensorineural hearing loss.  Symmetric in nature. PLAN: Reassured I do not see anything  bad going on.  She is a candidate for hearing aids.   will check her benefits.  . Gastric ulcer    years ago  . History of anemia 04/17/2012  . History of TIAs 04/17/2012   June 2011   . Hypertension   . Hypokalemia   . Pelvic fracture ([place]) 08/28/2013  . Personal history of colonic polyps 10/08/2011   Index polypectomy 2013   . Restless legs syndrome    on Requip  . S/P total hysterectomy and bilateral salpingo-oophorectomy 04/17/2012  . Situational stress   . Spinal stenosis   . Spinal stenosis, lumbar 04/17/2012  . Spinal stenosis, lumbar region, with neurogenic claudication 09/07/2013  . Stroke ([place])    Mini, no residual  . TIA (transient ischemic attack)   . Ulcer causing bleeding and hole in wall of stomach or small intestine   . Urinary bladder calculus   . Urinary incontinence   . Vertigo     Past Surgical History:  Procedure Laterality Date  . COLON RESECTION  50 years ago  . LUMBAR LAMINECTOMY/DECOMPRESSION MICRODISCECTOMY  06/25/2011   Procedure: LUMBAR LAMINECTOMY/DECOMPRESSION MICRODISCECTOMY;  Surgeon: Hosie Spangle, MD;  Location: [facility];  Service: Neurosurgery;  Laterality: Right;  RIGHT Lumbar Two-Three hemilaminectomy and microdiskectomy  . TONSILLECTOMY    . TOTAL ABDOMINAL HYSTERECTOMY W/ BILATERAL SALPINGOOPHORECTOMY  04/17/2012  . UTERINE FIBROID SURGERY      Allergies as of 09/15/2018      Reactions   Amoxicillin Other (See Comments)   Reaction unknown   Baclofen Other (  See Comments)   "fuzzy in the eyes" and dizzy   Hctz [hydrochlorothiazide]    nausea   Valium [diazepam] Other (See Comments)   Pt became unresponsive and O2 sats dropped.      Medication List       Accurate as of September 15, 2018 11:59 PM. Always use your most recent med list.        acetaminophen 325 MG tablet Commonly known as:  TYLENOL Take 325 mg by mouth. 2 tabs p.o. every 6 hours PRN. Notify MD if not relieved. Not to exceed 3000mg  in 24 hour period   calcium  carbonate 500 MG chewable tablet Commonly known as:  TUMS - dosed in mg elemental calcium Chew 1 tablet by mouth 3 (three) times daily with meals.   ferrous sulfate 325 (65 FE) MG tablet Take 325 mg by mouth 2 (two) times daily.   lidocaine 5 % Commonly known as:  LIDODERM Place 1 patch onto the skin. APPLY 1 PATCH TO RIGHT SHOULDER EVERY 12 HOURS FOR CHRONIC PAIN (ON FOR 12 HOURS AND OFF 12 HOURS)   lubiprostone 24 MCG capsule Commonly known as:  AMITIZA Take 24 mcg by mouth 2 (two) times daily with a meal.   Melatonin 3 MG Tabs Take 1 tablet by mouth at bedtime.   menthol-cetylpyridinium 3 MG lozenge Commonly known as:  CEPACOL Take 1 lozenge by mouth as needed for sore throat. TAKE 1 BY MOUTH EVERY 2 HOURS PRN FOR SORE THROAT. NOTIFY [facility] IF WORSENING SYMPTOM OR FOR TEMP GREATER THAN 99   mirtazapine 7.5 MG tablet Commonly known as:  REMERON Take 7.5 mg by mouth at bedtime.   morphine 15 MG 12 hr tablet Commonly known as:  MS CONTIN Take 15 mg by mouth. Give one tablet by mouth2 (two) times daily   multivitamin tablet Take 1 tablet by mouth daily.   Myrbetriq 50 MG Tb24 tablet Generic drug:  mirabegron ER Take 50 mg by mouth daily. bladder   ondansetron 4 MG tablet Commonly known as:  ZOFRAN Take 4 mg by mouth every 12 (twelve) hours as needed for nausea or vomiting.   oxyCODONE 5 MG immediate release tablet Commonly known as:  Oxy IR/ROXICODONE Take 1 tablet (5 mg total) by mouth daily. At 12 pm   potassium chloride 10 MEQ tablet Commonly known as:  K-DUR Take 20 mEq by mouth daily. 2 tablets   Protonix 40 MG tablet Generic drug:  pantoprazole Take 40 mg by mouth daily.   rOPINIRole 4 MG tablet Commonly known as:  REQUIP Take 4 mg by mouth at bedtime.   Systane Balance 0.6 % Soln Generic drug:  Propylene Glycol Place 1 drop into both eyes 2 (two) times daily.   torsemide 20 MG tablet Commonly known as:  DEMADEX Take 40 mg by mouth every Tuesday,  Thursday, Saturday, and Sunday.   torsemide 20 MG tablet Commonly known as:  DEMADEX Take 60 mg by mouth every Monday, Wednesday, and Friday. 3 tabs   vitamin C 500 MG tablet Commonly known as:  ASCORBIC ACID Take 500 mg by mouth daily.   Vitamin D3 1.25 MG (50000 UT) Caps Take 1 capsule by mouth every 30 (thirty) days.       No orders of the defined types were placed in this encounter.   Immunization History  Administered Date(s) Administered  . Influenza Split 04/15/2012  . Influenza,inj,Quad PF,6+ Mos 03/06/2014  . Influenza-Unspecified 03/01/2016, 05/01/2017  . PPD Test 08/30/2014, 09/18/2014  .  Pneumococcal Conjugate-13 09/25/2017  . Pneumococcal Polysaccharide-23 08/29/2013    Social History   Tobacco Use  . Smoking status: Never Smoker  . Smokeless tobacco: Never Used  Substance Use Topics  . Alcohol use: Yes    Alcohol/week: 0.0 standard drinks    Comment: rarely    Review of Systems  DATA OBTAINED: from patient-limited; nursing-no acute concerns GENERAL:  no fevers, fatigue, appetite changes SKIN: No itching, rash HEENT: No complaint RESPIRATORY: No cough, wheezing, SOB CARDIAC: No chest pain, palpitations, lower extremity edema  GI: No abdominal pain, No N/V/D or constipation, No heartburn or reflux  GU: No dysuria, frequency or urgency, or incontinence  MUSCULOSKELETAL: No unrelieved bone/joint pain NEUROLOGIC: No headache, dizziness  PSYCHIATRIC: No overt anxiety or sadness  Vitals:   09/15/18 1024  BP: (!) 99/45  Pulse: 78  Resp: 20  Temp: 98.9 F (37.2 C)   Body mass index is 17.03 kg/m. Physical Exam  GENERAL APPEARANCE: Alert, conversant, No acute distress  SKIN: No diaphoresis rash HEENT: Unremarkable RESPIRATORY: Breathing is even, unlabored. Lung sounds are clear   CARDIOVASCULAR: Heart RRR no murmurs, rubs or gallops. No peripheral edema  GASTROINTESTINAL: Abdomen is soft, non-tender, not distended w/ normal bowel sounds.   GENITOURINARY: Bladder non tender, not distended  MUSCULOSKELETAL: No abnormal joints or musculature NEUROLOGIC: Cranial nerves 2-12 grossly intact. Moves all extremities PSYCHIATRIC: Mood and affect appropriate to situation, no behavioral issues  Patient Active Problem List   Diagnosis Date Noted  . Dementia without behavioral disturbance ([place]) 07/29/2018  . Anxiety disorder, unspecified 07/23/2017  . Iron deficiency anemia 03/29/2017  . GERD (gastroesophageal reflux disease) 12/22/2016  . Decreased hearing of both ears 08/19/2016  . Pressure injury of skin 08/15/2016  . Aspiration pneumonia ([place]) 08/14/2016  . Chronic diastolic CHF (congestive heart failure) ([place]) 08/14/2016  . Chronic right shoulder pain 02/09/2016  . Bilateral lower extremity edema 02/02/2016  . Ileitis 01/19/2016  . Pressure ulcer 01/09/2016  . SBO (small bowel obstruction) ([place]) 01/08/2016  . Narcotic addiction ([place]) 05/15/2015  . Diarrhea 03/30/2015  . Acute pharyngitis 02/26/2015  . Osteoarthritis of right knee 02/22/2015  . Pain in joint, lower leg 10/31/2014  . Ulcer of sacral region, stage 4 ([place]) 04/15/2014  . Moderate aortic stenosis 04/14/2014  . Pre-syncope 04/14/2014  . Hypotension 04/14/2014  . Acute renal failure superimposed on stage 2 chronic kidney disease ([place]) 04/14/2014  . Anemia 04/14/2014  . Protein-calorie malnutrition, severe ([place]) 04/14/2014  . Altered mental status   . Dehydration 04/13/2014  . Altered mental state 04/13/2014  . Acute kidney injury ([place])   . Hypokalemia 03/25/2014  . Syncope 03/16/2014  . Acute encephalopathy 03/16/2014  . Leg swelling 03/02/2014  . Avascular necrosis of bone of right hip ([place]) 09/27/2013  . Primary osteoarthritis of right hip 09/07/2013  . Spinal stenosis, lumbar region, with neurogenic claudication 09/07/2013  . Closed fracture of pubic ramus ([place]) 09/07/2013  . BPPV (benign paroxysmal positional vertigo) 09/07/2013  . Pharyngeal swelling  08/30/2013  . Pelvic fracture ([place]) 08/28/2013  . Fall 08/26/2013  . Dizziness 08/26/2013  . Abnormal stress test 08/26/2013  . Preop cardiovascular exam 08/01/2013  . Aortic stenosis 08/01/2013  . Edema 06/30/2013  . Loss of weight 01/04/2013  . Situational stress 01/04/2013  . Chronic right hip pain 07/05/2012  . Knee pain, acute 06/29/2012  . Vertigo 06/24/2012  . Acute exacerbation of chronic low back pain 06/24/2012  . Lumbar pain with radiation down right leg 06/09/2012  .  Osteoarthritis of hip 04/29/2012  . History of TIAs 04/17/2012  . S/P total hysterectomy and bilateral salpingo-oophorectomy 04/17/2012  . Hard of hearing 04/17/2012  . History of anemia 04/17/2012  . RLS (restless legs syndrome) 04/17/2012  . Chronic back pain 04/17/2012  . Spinal stenosis, lumbar 04/17/2012  . Lower back pain 12/20/2011  . Personal history of colonic polyps 10/08/2011  . H/O 09/14/2011  . Essential hypertension 09/14/2011  . Restless legs syndrome 09/14/2011  . Constipation 08/20/2011  . Urinary incontinence 08/20/2011    CMP     Component Value Date/Time   NA 143 05/31/2018   K 3.6 05/31/2018   CL 106 08/17/2016 0343   CO2 28 08/17/2016 0343   GLUCOSE 92 08/17/2016 0343   BUN 38 (A) 05/31/2018   CREATININE 1.1 05/31/2018   CREATININE 1.13 (H) 08/17/2016 0343   CREATININE 0.78 06/28/2014 1205   CALCIUM 7.8 (L) 08/17/2016 0343   PROT 6.6 08/14/2016 1519   ALBUMIN 3.4 (L) 08/14/2016 1519   AST 14 03/01/2018   ALT 7 03/01/2018   ALKPHOS 94 03/01/2018   BILITOT 1.1 08/14/2016 1519   GFRNONAA 40 (L) 08/17/2016 0343   GFRAA 46 (L) 08/17/2016 0343   Recent Labs    03/01/18 05/31/18  NA 140 143  K 3.9 3.6  BUN 38* 38*  CREATININE 1.0 1.1   Recent Labs    03/01/18  AST 14  ALT 7  ALKPHOS 94   Recent Labs    03/01/18 05/31/18  WBC 8.1 7.6  HGB 8.4* 8.4*  HCT 25* 24*  PLT 285 300   Recent Labs    03/01/18  CHOL 128  LDLCALC 65  TRIG 65   No results found  for: [facility] Lab Results  Component Value Date   TSH 2.02 03/01/2018   Lab Results  Component Value Date   HGBA1C 6.3 03/01/2018   Lab Results  Component Value Date   CHOL 128 03/01/2018   HDL 50 03/01/2018   LDLCALC 65 03/01/2018   TRIG 65 03/01/2018   CHOLHDL 2.8 03/06/2014    Significant Diagnostic Results in last 30 days:  No results found.  Assessment and Plan  Moderate aortic stenosis Patient has remained stable with her AS; continue Demadex daily  Chronic diastolic CHF (congestive heart failure) ([facility]) Chronic and stable without exacerbation; continue Demadex 40 mg Tuesday Thursday Saturday Sunday and 60 mg Monday Friday  Constipation No recent problems reported; continue Amitiza 24 mcg daily    Prosperity Darrough D. [name], MD

## 2018-09-18 ENCOUNTER — Encounter: Payer: Self-pay | Admitting: Internal Medicine

## 2018-09-18 NOTE — Assessment & Plan Note (Signed)
Patient has remained stable with her AS; continue Demadex daily

## 2018-09-18 NOTE — Assessment & Plan Note (Signed)
No recent problems reported; continue Amitiza 24 mcg daily

## 2018-09-18 NOTE — Assessment & Plan Note (Signed)
Chronic and stable without exacerbation; continue Demadex 40 mg Tuesday Thursday Saturday Sunday and 60 mg Monday Friday

## 2018-10-05 ENCOUNTER — Other Ambulatory Visit: Payer: Self-pay | Admitting: Internal Medicine

## 2018-10-05 MED ORDER — OXYCODONE HCL 5 MG PO TABS: 5.0000 mg | ORAL_TABLET | Freq: Every day | ORAL | 0 refills | Status: DC

## 2018-10-05 MED ORDER — MORPHINE SULFATE ER 15 MG PO TBCR: 15.0000 mg | EXTENDED_RELEASE_TABLET | Freq: Two times a day (BID) | ORAL | 0 refills | Status: DC

## 2018-10-06 ENCOUNTER — Other Ambulatory Visit: Payer: Self-pay | Admitting: Internal Medicine

## 2018-10-11 ENCOUNTER — Non-Acute Institutional Stay (SKILLED_NURSING_FACILITY): Payer: Medicare Other | Admitting: Internal Medicine

## 2018-10-11 ENCOUNTER — Encounter: Payer: Self-pay | Admitting: Internal Medicine

## 2018-10-11 DIAGNOSIS — G2581 Restless legs syndrome: Secondary | ICD-10-CM | POA: Diagnosis not present

## 2018-10-11 DIAGNOSIS — M160 Bilateral primary osteoarthritis of hip: Secondary | ICD-10-CM

## 2018-10-11 DIAGNOSIS — M48062 Spinal stenosis, lumbar region with neurogenic claudication: Secondary | ICD-10-CM | POA: Diagnosis not present

## 2018-10-11 NOTE — Progress Notes (Signed)
Location:  [facility] Room Number: 303-D Place of Service:  SNF (31)  [name], MD  Patient Care Team: [name], MD as PCP - General (Internal Medicine) [name], MD as Consulting Physician (Orthopedic Surgery) [name], MD as Consulting Physician (Otolaryngology) Mast, Man X, NP as Nurse Practitioner (Internal Medicine)  Extended Emergency Contact Information Primary Emergency Contact: [name] Address: [place], [place] [address] [name] of [place] Phone: [phone] Relation: Daughter Secondary Emergency Contact: [place], [place] of [place] Mobile Phone: [phone] Relation: Son    Allergies: Amoxicillin; Baclofen; Hctz [hydrochlorothiazide]; and Valium [diazepam]  Chief Complaint  Patient presents with  . Medical Management of Chronic Issues    Routine [facility] visit    HPI: Patient is 85 y.o. female who is being seen for routine issues of osteoarthritis of hip, restless leg syndrome, and lumbar stenosis.  Past Medical History:  Diagnosis Date  . Abnormal stress test 08/26/2013  . Acute encephalopathy   . AKI (acute kidney injury) ([place])   . Altered mental state   . Anemia   . Anxiety disorder, unspecified   . Aortic stenosis   . Arthritis    Back, knees  . Avascular necrosis of bone of right hip ([place]) 09/27/2013  . Bowel incontinence   . BPPV (benign paroxysmal positional vertigo) 09/07/2013  . Closed fracture of pubic ramus ([place]) 09/07/2013  . Colon polyps   . Constipation   . Decreased hearing of both ears 08/19/2016   Last Assessment & Plan:  Concern over hearing loss. Slowly progressive hearing loss.  No history of ear surgery, trauma or infection. EXAMINATION shows normal external canals and tympanic membranes. AUDIOGRAM severe sensorineural hearing loss.  Symmetric in nature. PLAN: Reassured I do not see anything  bad going on.  She is a candidate for hearing aids.   will check her benefits.  . Gastric ulcer    years ago  . History of anemia 04/17/2012  . History of TIAs 04/17/2012   June 2011   . Hypertension   . Hypokalemia   . Pelvic fracture ([place]) 08/28/2013  . Personal history of colonic polyps 10/08/2011   Index polypectomy 2013   . Restless legs syndrome    on Requip  . S/P total hysterectomy and bilateral salpingo-oophorectomy 04/17/2012  . Situational stress   . Spinal stenosis   . Spinal stenosis, lumbar 04/17/2012  . Spinal stenosis, lumbar region, with neurogenic claudication 09/07/2013  . Stroke ([place])    Mini, no residual  . TIA (transient ischemic attack)   . Ulcer causing bleeding and hole in wall of stomach or small intestine   . Urinary bladder calculus   . Urinary incontinence   . Vertigo     Past Surgical History:  Procedure Laterality Date  . COLON RESECTION  50 years ago  . LUMBAR LAMINECTOMY/DECOMPRESSION MICRODISCECTOMY  06/25/2011   Procedure: LUMBAR LAMINECTOMY/DECOMPRESSION MICRODISCECTOMY;  Surgeon: Hosie Spangle, MD;  Location: [facility];  Service: Neurosurgery;  Laterality: Right;  RIGHT Lumbar Two-Three hemilaminectomy and microdiskectomy  . TONSILLECTOMY    . TOTAL ABDOMINAL HYSTERECTOMY W/ BILATERAL SALPINGOOPHORECTOMY  04/17/2012  . UTERINE FIBROID SURGERY      Allergies as of 10/11/2018      Reactions   Amoxicillin Other (See Comments)   Reaction unknown  Baclofen Other (See Comments)   "fuzzy in the eyes" and dizzy   Hctz [hydrochlorothiazide]    nausea   Valium [diazepam] Other (See Comments)   Pt became unresponsive and O2 sats dropped.      Medication List       Accurate as of Oct 11, 2018 11:59 PM. If you have any questions, ask your nurse or doctor.        acetaminophen 325 MG tablet Commonly known as:  TYLENOL Take 325 mg by mouth. 2 tabs p.o. every 6 hours PRN. Notify MD if not relieved. Not to exceed 3000mg  in 24 hour period    ammonium lactate 12 % lotion Commonly known as:  LAC-HYDRIN Apply 1 application topically daily. Apply to bilateral feet   calcium carbonate 500 MG chewable tablet Commonly known as:  TUMS - dosed in mg elemental calcium Chew 1 tablet by mouth 3 (three) times daily with meals.   feeding supplement (PRO-STAT SUGAR FREE 64) Liqd Take 30 mLs by mouth 2 (two) times a day.   ferrous sulfate 325 (65 FE) MG tablet Take 325 mg by mouth 2 (two) times daily.   lidocaine 5 % Commonly known as:  LIDODERM Place 1 patch onto the skin. APPLY 1 PATCH TO RIGHT SHOULDER EVERY 12 HOURS FOR CHRONIC PAIN (ON FOR 12 HOURS AND OFF 12 HOURS)   lubiprostone 24 MCG capsule Commonly known as:  AMITIZA Take 24 mcg by mouth 2 (two) times daily with a meal.   Melatonin 3 MG Tabs Take 1 tablet by mouth at bedtime.   menthol-cetylpyridinium 3 MG lozenge Commonly known as:  CEPACOL Take 1 lozenge by mouth as needed for sore throat. TAKE 1 BY MOUTH EVERY 2 HOURS PRN FOR SORE THROAT. NOTIFY OPTUM IF WORSENING SYMPTOM OR FOR TEMP GREATER THAN 99   mirtazapine 7.5 MG tablet Commonly known as:  REMERON Take 7.5 mg by mouth at bedtime.   morphine 15 MG 12 hr tablet Commonly known as:  MS CONTIN Take 1 tablet (15 mg total) by mouth every 12 (twelve) hours.   multivitamin tablet Take 1 tablet by mouth daily.   Myrbetriq 50 MG Tb24 tablet Generic drug:  mirabegron ER Take 50 mg by mouth daily. bladder   ondansetron 4 MG tablet Commonly known as:  ZOFRAN Take 4 mg by mouth every 12 (twelve) hours as needed for nausea or vomiting.   oxyCODONE 5 MG immediate release tablet Commonly known as:  Oxy IR/ROXICODONE Take 1 tablet (5 mg total) by mouth daily. At 12 pm   potassium chloride 10 MEQ tablet Commonly known as:  K-DUR Take 20 mEq by mouth daily. 2 tablets   Protonix 40 MG tablet Generic drug:  pantoprazole Take 40 mg by mouth daily.   rOPINIRole 4 MG tablet Commonly known as:  REQUIP Take 4  mg by mouth at bedtime.   Systane Balance 0.6 % Soln Generic drug:  Propylene Glycol Place 1 drop into both eyes 2 (two) times daily.   torsemide 20 MG tablet Commonly known as:  DEMADEX Take 40 mg by mouth every Tuesday, Thursday, Saturday, and Sunday.   torsemide 20 MG tablet Commonly known as:  DEMADEX Take 60 mg by mouth every Monday, Wednesday, and Friday. 3 tabs   vitamin C 500 MG tablet Commonly known as:  ASCORBIC ACID Take 500 mg by mouth daily.   Vitamin D3 1.25 MG (50000 UT) Caps Take 1 capsule by mouth every 30 (thirty) days.  No orders of the defined types were placed in this encounter.   Immunization History  Administered Date(s) Administered  . Influenza Split 04/15/2012  . Influenza,inj,Quad PF,6+ Mos 03/06/2014  . Influenza-Unspecified 03/01/2016, 05/01/2017, 03/12/2018  . PPD Test 08/30/2014, 09/18/2014  . Pneumococcal Conjugate-13 09/25/2017  . Pneumococcal Polysaccharide-23 08/29/2013    Social History   Tobacco Use  . Smoking status: Never Smoker  . Smokeless tobacco: Never Used  Substance Use Topics  . Alcohol use: Yes    Alcohol/week: 0.0 standard drinks    Comment: rarely    Review of Systems  DATA OBTAINED: from patient, nurse GENERAL:  no fevers, fatigue, appetite changes SKIN: No itching, rash HEENT: No complaint RESPIRATORY: No cough, wheezing, SOB CARDIAC: No chest pain, palpitations, lower extremity edema  GI: No abdominal pain, No N/V/D or constipation, No heartburn or reflux  GU: No dysuria, frequency or urgency, or incontinence  MUSCULOSKELETAL: No unrelieved bone/joint pain NEUROLOGIC: No headache, dizziness  PSYCHIATRIC: No overt anxiety or sadness  Vitals:   10/11/18 1421  BP: (!) 98/56  Pulse: 94  Resp: 16  Temp: 98.5 F (36.9 C)  SpO2: 98%   Body mass index is 16.8 kg/m. Physical Exam  GENERAL APPEARANCE: Alert, conversant, No acute distress  SKIN: No diaphoresis rash HEENT: Unremarkable  RESPIRATORY: Breathing is even, unlabored. Lung sounds are clear   CARDIOVASCULAR: Heart RRR no murmurs, rubs or gallops. No peripheral edema  GASTROINTESTINAL: Abdomen is soft, non-tender, not distended w/ normal bowel sounds.  GENITOURINARY: Bladder non tender, not distended  MUSCULOSKELETAL: No abnormal joints or musculature NEUROLOGIC: Cranial nerves 2-12 grossly intact. Moves all extremities PSYCHIATRIC: Mood and affect dementia, no behavioral issues  Patient Active Problem List   Diagnosis Date Noted  . Dementia without behavioral disturbance ([place]) 07/29/2018  . Anxiety disorder, unspecified 07/23/2017  . Iron deficiency anemia 03/29/2017  . GERD (gastroesophageal reflux disease) 12/22/2016  . Decreased hearing of both ears 08/19/2016  . Pressure injury of skin 08/15/2016  . Aspiration pneumonia ([place]) 08/14/2016  . Chronic diastolic CHF (congestive heart failure) ([place]) 08/14/2016  . Chronic right shoulder pain 02/09/2016  . Bilateral lower extremity edema 02/02/2016  . Ileitis 01/19/2016  . Pressure ulcer 01/09/2016  . SBO (small bowel obstruction) ([place]) 01/08/2016  . Narcotic addiction ([place]) 05/15/2015  . Diarrhea 03/30/2015  . Acute pharyngitis 02/26/2015  . Osteoarthritis of right knee 02/22/2015  . Pain in joint, lower leg 10/31/2014  . Ulcer of sacral region, stage 4 ([place]) 04/15/2014  . Moderate aortic stenosis 04/14/2014  . Pre-syncope 04/14/2014  . Hypotension 04/14/2014  . Acute renal failure superimposed on stage 2 chronic kidney disease ([place]) 04/14/2014  . Anemia 04/14/2014  . Protein-calorie malnutrition, severe ([place]) 04/14/2014  . Altered mental status   . Dehydration 04/13/2014  . Altered mental state 04/13/2014  . Acute kidney injury ([place])   . Hypokalemia 03/25/2014  . Syncope 03/16/2014  . Acute encephalopathy 03/16/2014  . Leg swelling 03/02/2014  . Avascular necrosis of bone of right hip ([place]) 09/27/2013  . Primary osteoarthritis of right hip  09/07/2013  . Spinal stenosis, lumbar region, with neurogenic claudication 09/07/2013  . Closed fracture of pubic ramus ([place]) 09/07/2013  . BPPV (benign paroxysmal positional vertigo) 09/07/2013  . Pharyngeal swelling 08/30/2013  . Pelvic fracture ([place]) 08/28/2013  . Fall 08/26/2013  . Dizziness 08/26/2013  . Abnormal stress test 08/26/2013  . Preop cardiovascular exam 08/01/2013  . Aortic stenosis 08/01/2013  . Edema 06/30/2013  . Loss of weight 01/04/2013  .  Situational stress 01/04/2013  . Chronic right hip pain 07/05/2012  . Knee pain, acute 06/29/2012  . Vertigo 06/24/2012  . Acute exacerbation of chronic low back pain 06/24/2012  . Lumbar pain with radiation down right leg 06/09/2012  . Osteoarthritis of hip 04/29/2012  . History of TIAs 04/17/2012  . S/P total hysterectomy and bilateral salpingo-oophorectomy 04/17/2012  . Hard of hearing 04/17/2012  . History of anemia 04/17/2012  . RLS (restless legs syndrome) 04/17/2012  . Chronic back pain 04/17/2012  . Spinal stenosis, lumbar 04/17/2012  . Lower back pain 12/20/2011  . Personal history of colonic polyps 10/08/2011  . H/O 09/14/2011  . Essential hypertension 09/14/2011  . Restless legs syndrome 09/14/2011  . Constipation 08/20/2011  . Urinary incontinence 08/20/2011    CMP     Component Value Date/Time   NA 143 05/31/2018   K 3.6 05/31/2018   CL 106 08/17/2016 0343   CO2 28 08/17/2016 0343   GLUCOSE 92 08/17/2016 0343   BUN 38 (A) 05/31/2018   CREATININE 1.1 05/31/2018   CREATININE 1.13 (H) 08/17/2016 0343   CREATININE 0.78 06/28/2014 1205   CALCIUM 7.8 (L) 08/17/2016 0343   PROT 6.6 08/14/2016 1519   ALBUMIN 3.4 (L) 08/14/2016 1519   AST 14 03/01/2018   ALT 7 03/01/2018   ALKPHOS 94 03/01/2018   BILITOT 1.1 08/14/2016 1519   GFRNONAA 40 (L) 08/17/2016 0343   GFRAA 46 (L) 08/17/2016 0343   Recent Labs    03/01/18 05/31/18  NA 140 143  K 3.9 3.6  BUN 38* 38*  CREATININE 1.0 1.1   Recent Labs     03/01/18  AST 14  ALT 7  ALKPHOS 94   Recent Labs    03/01/18 05/31/18  WBC 8.1 7.6  HGB 8.4* 8.4*  HCT 25* 24*  PLT 285 300   Recent Labs    03/01/18  CHOL 128  LDLCALC 65  TRIG 65   No results found for: [facility] Lab Results  Component Value Date   TSH 2.02 03/01/2018   Lab Results  Component Value Date   HGBA1C 6.3 03/01/2018   Lab Results  Component Value Date   CHOL 128 03/01/2018   HDL 50 03/01/2018   LDLCALC 65 03/01/2018   TRIG 65 03/01/2018   CHOLHDL 2.8 03/06/2014    Significant Diagnostic Results in last 30 days:  No results found.  Assessment and Plan  Osteoarthritis of hip Necrosis of femoral head; patient is followed by pain clinic with a regimen of MS Contin 15 mg twice daily and oxycodone 5 mg 1 time daily  Restless legs syndrome No complaints; continue Requip 4 mg nightly  Spinal stenosis, lumbar Is been a while the patient has complained to me or low back pain is been reported; well controlled on her current regimen of MS Contin 15 mg every 12 hours and one Percocet every day at noon    Beatris Belen D. [name], MD

## 2018-10-15 ENCOUNTER — Encounter: Payer: Self-pay | Admitting: Internal Medicine

## 2018-10-15 NOTE — Assessment & Plan Note (Signed)
Necrosis of femoral head; patient is followed by pain clinic with a regimen of MS Contin 15 mg twice daily and oxycodone 5 mg 1 time daily

## 2018-10-15 NOTE — Assessment & Plan Note (Signed)
No complaints; continue Requip 4 mg nightly

## 2018-10-15 NOTE — Assessment & Plan Note (Signed)
Is been a while the patient has complained to me or low back pain is been reported; well controlled on her current regimen of MS Contin 15 mg every 12 hours and one Percocet every day at noon

## 2018-11-02 ENCOUNTER — Non-Acute Institutional Stay (SKILLED_NURSING_FACILITY): Payer: Medicare Other | Admitting: Internal Medicine

## 2018-11-02 DIAGNOSIS — L03319 Cellulitis of trunk, unspecified: Secondary | ICD-10-CM

## 2018-11-02 DIAGNOSIS — S31000S Unspecified open wound of lower back and pelvis without penetration into retroperitoneum, sequela: Secondary | ICD-10-CM | POA: Diagnosis not present

## 2018-11-06 ENCOUNTER — Encounter: Payer: Self-pay | Admitting: Internal Medicine

## 2018-11-06 DIAGNOSIS — L03319 Cellulitis of trunk, unspecified: Secondary | ICD-10-CM | POA: Insufficient documentation

## 2018-11-06 DIAGNOSIS — S31000A Unspecified open wound of lower back and pelvis without penetration into retroperitoneum, initial encounter: Secondary | ICD-10-CM | POA: Insufficient documentation

## 2018-11-06 NOTE — Assessment & Plan Note (Signed)
X-ray shows no osteomyelitis; will start doxycycline 100 mg twice daily for 10 days; monitor response

## 2018-11-06 NOTE — Assessment & Plan Note (Signed)
Yes

## 2018-11-06 NOTE — Progress Notes (Signed)
Location:  [place] of Service:  SNF (31)SNF  Shelly Duos, MD  Patient Care Team: Shelly Duos, MD as PCP - General (Internal Medicine) [name], MD as Consulting Physician (Orthopedic Surgery) [name], MD as Consulting Physician (Otolaryngology) Mast, Man X, NP as Nurse Practitioner (Internal Medicine)  Extended Emergency Contact Information Primary Emergency Contact: [name] Address: [place], [place] [address] [place] Phone: [phone] Relation: Daughter Secondary Emergency Contact: [place], [place] Mobile Phone: [phone] Relation: Son    Allergies: Amoxicillin, Baclofen, Hctz [hydrochlorothiazide], and Valium [diazepam]  Chief Complaint  Patient presents with  . Acute Visit    HPI: Patient is 85 y.o. female who the wound care nurse asked me to see.  Patient has a wound on her sacrum that initially started with a small puncture mark from a pen that she had been sitting on in her wheelchair.  This was 1 to 2 years ago.  The area is not large but is got induration erythema and some odor.  Nurses report no fever chills nausea vomiting or other systemic symptoms.  Past Medical History:  Diagnosis Date  . Abnormal stress test 08/26/2013  . Acute encephalopathy   . AKI (acute kidney injury) ([place])   . Altered mental state   . Anemia   . Anxiety disorder, unspecified   . Aortic stenosis   . Arthritis    Back, knees  . Avascular necrosis of bone of right hip ([place]) 09/27/2013  . Bowel incontinence   . BPPV (benign paroxysmal positional vertigo) 09/07/2013  . Closed fracture of pubic ramus ([place]) 09/07/2013  . Colon polyps   . Constipation   . Decreased hearing of both ears 08/19/2016   Last Assessment & Plan:  Concern over hearing loss. Slowly progressive hearing loss.  No history of ear surgery, trauma or  infection. EXAMINATION shows normal external canals and tympanic membranes. AUDIOGRAM severe sensorineural hearing loss.  Symmetric in nature. PLAN: Reassured I do not see anything bad going on.  She is a candidate for hearing aids.   will check her benefits.  . Gastric ulcer    years ago  . History of anemia 04/17/2012  . History of TIAs 04/17/2012   June 2011   . Hypertension   . Hypokalemia   . Pelvic fracture ([place]) 08/28/2013  . Personal history of colonic polyps 10/08/2011   Index polypectomy 2013   . Restless legs syndrome    on [place]  . S/P total hysterectomy and bilateral salpingo-oophorectomy 04/17/2012  . Situational stress   . Spinal stenosis   . Spinal stenosis, lumbar 04/17/2012  . Spinal stenosis, lumbar region, with neurogenic claudication 09/07/2013  . Stroke ([place])    Mini, no residual  . TIA (transient ischemic attack)   . Ulcer causing bleeding and hole in wall of stomach or small intestine   . Urinary bladder calculus   . Urinary incontinence   . Vertigo     Past Surgical History:  Procedure Laterality Date  . COLON RESECTION  50 years ago  . LUMBAR LAMINECTOMY/DECOMPRESSION MICRODISCECTOMY  06/25/2011   Procedure: LUMBAR LAMINECTOMY/DECOMPRESSION MICRODISCECTOMY;  Surgeon: Hosie Spangle, MD;  Location: [facility];  Service: Neurosurgery;  Laterality: Right;  RIGHT Lumbar Two-Three hemilaminectomy and microdiskectomy  . TONSILLECTOMY    .  TOTAL ABDOMINAL HYSTERECTOMY W/ BILATERAL SALPINGOOPHORECTOMY  04/17/2012  . UTERINE FIBROID SURGERY      Allergies as of 11/02/2018      Reactions   Amoxicillin Other (See Comments)   Reaction unknown   Baclofen Other (See Comments)   "fuzzy in the eyes" and dizzy   Hctz [hydrochlorothiazide]    nausea   Valium [diazepam] Other (See Comments)   Pt became unresponsive and O2 sats dropped.      Medication List       Accurate as of November 02, 2018 11:59 PM. If you have any questions, ask your nurse or doctor.         acetaminophen 325 MG tablet Commonly known as: TYLENOL Take 325 mg by mouth. 2 tabs p.o. every 6 hours PRN. Notify MD if not relieved. Not to exceed 3000mg  in 24 hour period   ammonium lactate 12 % lotion Commonly known as: LAC-HYDRIN Apply 1 application topically daily. Apply to bilateral feet   calcium carbonate 500 MG chewable tablet Commonly known as: TUMS - dosed in mg elemental calcium Chew 1 tablet by mouth 3 (three) times daily with meals.   feeding supplement (PRO-STAT SUGAR FREE 64) Liqd Take 30 mLs by mouth 2 (two) times a day.   ferrous sulfate 325 (65 FE) MG tablet Take 325 mg by mouth 2 (two) times daily.   lidocaine 5 % Commonly known as: LIDODERM Place 1 patch onto the skin. APPLY 1 PATCH TO RIGHT SHOULDER EVERY 12 HOURS FOR CHRONIC PAIN (ON FOR 12 HOURS AND OFF 12 HOURS)   lubiprostone 24 MCG capsule Commonly known as: AMITIZA Take 24 mcg by mouth 2 (two) times daily with a meal.   Melatonin 3 MG Tabs Take 1 tablet by mouth at bedtime.   menthol-cetylpyridinium 3 MG lozenge Commonly known as: CEPACOL Take 1 lozenge by mouth as needed for sore throat. TAKE 1 BY MOUTH EVERY 2 HOURS PRN FOR SORE THROAT. NOTIFY [facility] IF WORSENING SYMPTOM OR FOR TEMP GREATER THAN 99   mirtazapine 7.5 MG tablet Commonly known as: REMERON Take 7.5 mg by mouth at bedtime.   morphine 15 MG 12 hr tablet Commonly known as: MS CONTIN Take 1 tablet (15 mg total) by mouth every 12 (twelve) hours.   multivitamin tablet Take 1 tablet by mouth daily.   Myrbetriq 50 MG Tb24 tablet Generic drug: mirabegron ER Take 50 mg by mouth daily. bladder   ondansetron 4 MG tablet Commonly known as: ZOFRAN Take 4 mg by mouth every 12 (twelve) hours as needed for nausea or vomiting.   oxyCODONE 5 MG immediate release tablet Commonly known as: Oxy IR/ROXICODONE Take 1 tablet (5 mg total) by mouth daily. At 12 pm   potassium chloride 10 MEQ tablet Commonly known as: K-DUR Take 20 mEq by  mouth daily. 2 tablets   Protonix 40 MG tablet Generic drug: pantoprazole Take 40 mg by mouth daily.   rOPINIRole 4 MG tablet Commonly known as: REQUIP Take 4 mg by mouth at bedtime.   Systane Balance 0.6 % Soln Generic drug: Propylene Glycol Place 1 drop into both eyes 2 (two) times daily.   torsemide 20 MG tablet Commonly known as: DEMADEX Take 40 mg by mouth every Tuesday, Thursday, Saturday, and Sunday.   torsemide 20 MG tablet Commonly known as: DEMADEX Take 60 mg by mouth every Monday, Wednesday, and Friday. 3 tabs   vitamin C 500 MG tablet Commonly known as: ASCORBIC ACID Take 500 mg by mouth daily.  Vitamin D3 1.25 MG (50000 UT) Caps Take 1 capsule by mouth every 30 (thirty) days.       No orders of the defined types were placed in this encounter.   Immunization History  Administered Date(s) Administered  . Influenza Split 04/15/2012  . Influenza,inj,Quad PF,6+ Mos 03/06/2014  . Influenza-Unspecified 03/01/2016, 05/01/2017, 03/12/2018  . PPD Test 08/30/2014, 09/18/2014  . Pneumococcal Conjugate-13 09/25/2017  . Pneumococcal Polysaccharide-23 08/29/2013    Social History   Tobacco Use  . Smoking status: Never Smoker  . Smokeless tobacco: Never Used  Substance Use Topics  . Alcohol use: Yes    Alcohol/week: 0.0 standard drinks    Comment: rarely    Review of Systems  DATA OBTAINED: from patient-limited; nurse-as per history present illness GENERAL:  no fevers, fatigue, appetite changes SKIN: Possible infection of sacrum HEENT: No complaint RESPIRATORY: No cough, wheezing, SOB CARDIAC: No chest pain, palpitations, lower extremity edema  GI: No abdominal pain, No N/V/D or constipation, No heartburn or reflux  GU: No dysuria, frequency or urgency, or incontinence  MUSCULOSKELETAL: No unrelieved bone/joint pain NEUROLOGIC: No headache, dizziness  PSYCHIATRIC: No overt anxiety or sadness  Vitals:   11/06/18 1604  BP: 100/76  Pulse: 72  Resp:  16  Temp: 97.9 F (36.6 C)   There is no height or weight on file to calculate BMI. Physical Exam  GENERAL APPEARANCE: Alert, conversant, No acute distress  SKIN: Wound is dressed not visualized, odor present HEENT: Unremarkable RESPIRATORY: Breathing is even, unlabored. Lung sounds are clear   CARDIOVASCULAR: Heart RRR no murmurs, rubs or gallops. No peripheral edema  GASTROINTESTINAL: Abdomen is soft, non-tender, not distended w/ normal bowel sounds.  GENITOURINARY: Bladder non tender, not distended  MUSCULOSKELETAL: No abnormal joints or musculature NEUROLOGIC: Cranial nerves 2-12 grossly intact. Moves all extremities PSYCHIATRIC: Mood and affect dementia, no behavioral issues  Patient Active Problem List   Diagnosis Date Noted  . Sacral wound 11/06/2018  . Cellulitis of sacral region 11/06/2018  . Dementia without behavioral disturbance ([place]) 07/29/2018  . Anxiety disorder, unspecified 07/23/2017  . Iron deficiency anemia 03/29/2017  . GERD (gastroesophageal reflux disease) 12/22/2016  . Decreased hearing of both ears 08/19/2016  . Pressure injury of skin 08/15/2016  . Aspiration pneumonia ([place]) 08/14/2016  . Chronic diastolic CHF (congestive heart failure) ([place]) 08/14/2016  . Chronic right shoulder pain 02/09/2016  . Bilateral lower extremity edema 02/02/2016  . Ileitis 01/19/2016  . Pressure ulcer 01/09/2016  . SBO (small bowel obstruction) ([place]) 01/08/2016  . Narcotic addiction ([place]) 05/15/2015  . Diarrhea 03/30/2015  . Acute pharyngitis 02/26/2015  . Osteoarthritis of right knee 02/22/2015  . Pain in joint, lower leg 10/31/2014  . Ulcer of sacral region, stage 4 ([place]) 04/15/2014  . Moderate aortic stenosis 04/14/2014  . Pre-syncope 04/14/2014  . Hypotension 04/14/2014  . Acute renal failure superimposed on stage 2 chronic kidney disease ([place]) 04/14/2014  . Anemia 04/14/2014  . Protein-calorie malnutrition, severe ([place]) 04/14/2014  . Altered mental status   .  Dehydration 04/13/2014  . Altered mental state 04/13/2014  . Acute kidney injury ([place])   . Hypokalemia 03/25/2014  . Syncope 03/16/2014  . Acute encephalopathy 03/16/2014  . Leg swelling 03/02/2014  . Avascular necrosis of bone of right hip ([place]) 09/27/2013  . Primary osteoarthritis of right hip 09/07/2013  . Spinal stenosis, lumbar region, with neurogenic claudication 09/07/2013  . Closed fracture of pubic ramus ([place]) 09/07/2013  . BPPV (benign paroxysmal positional vertigo) 09/07/2013  .  Pharyngeal swelling 08/30/2013  . Pelvic fracture ([place]) 08/28/2013  . Fall 08/26/2013  . Dizziness 08/26/2013  . Abnormal stress test 08/26/2013  . Preop cardiovascular exam 08/01/2013  . Aortic stenosis 08/01/2013  . Edema 06/30/2013  . Loss of weight 01/04/2013  . Situational stress 01/04/2013  . Chronic right hip pain 07/05/2012  . Knee pain, acute 06/29/2012  . Vertigo 06/24/2012  . Acute exacerbation of chronic low back pain 06/24/2012  . Lumbar pain with radiation down right leg 06/09/2012  . Osteoarthritis of hip 04/29/2012  . History of TIAs 04/17/2012  . S/P total hysterectomy and bilateral salpingo-oophorectomy 04/17/2012  . Hard of hearing 04/17/2012  . History of anemia 04/17/2012  . RLS (restless legs syndrome) 04/17/2012  . Chronic back pain 04/17/2012  . Spinal stenosis, lumbar 04/17/2012  . Lower back pain 12/20/2011  . Personal history of colonic polyps 10/08/2011  . H/O 09/14/2011  . Essential hypertension 09/14/2011  . Restless legs syndrome 09/14/2011  . Constipation 08/20/2011  . Urinary incontinence 08/20/2011    CMP     Component Value Date/Time   NA 143 05/31/2018   K 3.6 05/31/2018   CL 106 08/17/2016 0343   CO2 28 08/17/2016 0343   GLUCOSE 92 08/17/2016 0343   BUN 38 (A) 05/31/2018   CREATININE 1.1 05/31/2018   CREATININE 1.13 (H) 08/17/2016 0343   CREATININE 0.78 06/28/2014 1205   CALCIUM 7.8 (L) 08/17/2016 0343   PROT 6.6 08/14/2016 1519    ALBUMIN 3.4 (L) 08/14/2016 1519   AST 14 03/01/2018   ALT 7 03/01/2018   ALKPHOS 94 03/01/2018   BILITOT 1.1 08/14/2016 1519   GFRNONAA 40 (L) 08/17/2016 0343   GFRAA 46 (L) 08/17/2016 0343   Recent Labs    03/01/18 05/31/18  NA 140 143  K 3.9 3.6  BUN 38* 38*  CREATININE 1.0 1.1   Recent Labs    03/01/18  AST 14  ALT 7  ALKPHOS 94   Recent Labs    03/01/18 05/31/18  WBC 8.1 7.6  HGB 8.4* 8.4*  HCT 25* 24*  PLT 285 300   Recent Labs    03/01/18  CHOL 128  LDLCALC 65  TRIG 65   No results found for: [facility] Lab Results  Component Value Date   TSH 2.02 03/01/2018   Lab Results  Component Value Date   HGBA1C 6.3 03/01/2018   Lab Results  Component Value Date   CHOL 128 03/01/2018   HDL 50 03/01/2018   LDLCALC 65 03/01/2018   TRIG 65 03/01/2018   CHOLHDL 2.8 03/06/2014    Significant Diagnostic Results in last 30 days:  No results found.  Assessment and Plan  Sacral wound Yes  Cellulitis of sacral region X-ray shows no osteomyelitis; will start doxycycline 100 mg twice daily for 10 days; monitor response     Shelly Duos, MD

## 2018-11-08 ENCOUNTER — Other Ambulatory Visit: Payer: Self-pay | Admitting: Internal Medicine

## 2018-11-08 MED ORDER — MORPHINE SULFATE ER 15 MG PO TBCR: 15.0000 mg | EXTENDED_RELEASE_TABLET | Freq: Two times a day (BID) | ORAL | 0 refills | Status: DC

## 2018-11-10 ENCOUNTER — Encounter: Payer: Self-pay | Admitting: Internal Medicine

## 2018-11-10 ENCOUNTER — Non-Acute Institutional Stay (SKILLED_NURSING_FACILITY): Payer: Medicare Other | Admitting: Internal Medicine

## 2018-11-10 DIAGNOSIS — G301 Alzheimer's disease with late onset: Secondary | ICD-10-CM

## 2018-11-10 DIAGNOSIS — I10 Essential (primary) hypertension: Secondary | ICD-10-CM

## 2018-11-10 DIAGNOSIS — K2100 Gastro-esophageal reflux disease with esophagitis, without bleeding: Secondary | ICD-10-CM

## 2018-11-10 DIAGNOSIS — F0280 Dementia in other diseases classified elsewhere without behavioral disturbance: Secondary | ICD-10-CM

## 2018-11-10 NOTE — Progress Notes (Signed)
Location:  [facility] Room Number: 303-D Place of Service:  SNF (31)  [name], MD  Patient Care Team: [name], MD as PCP - General (Internal Medicine) [name], MD as Consulting Physician (Orthopedic Surgery) [name], MD as Consulting Physician (Otolaryngology) Mast, Man X, NP as Nurse Practitioner (Internal Medicine)  Extended Emergency Contact Information Primary Emergency Contact: [name] Address: [place], [place] [address] [place] Phone: [phone] Relation: Daughter Secondary Emergency Contact: [place], [place] Mobile Phone: [phone] Relation: Son    Allergies: Amoxicillin, Baclofen, Hctz [hydrochlorothiazide], and Valium [diazepam]  Chief Complaint  Patient presents with  . Medical Management of Chronic Issues    Routine [facility] visit    HPI: Patient is a 85 y.o. female who is being seen for routine issues of hypertension, GERD, and dementia without behaviors.  Past Medical History:  Diagnosis Date  . Abnormal stress test 08/26/2013  . Acute encephalopathy   . AKI (acute kidney injury) ([place])   . Altered mental state   . Anemia   . Anxiety disorder, unspecified   . Aortic stenosis   . Arthritis    Back, knees  . Avascular necrosis of bone of right hip ([place]) 09/27/2013  . Bowel incontinence   . BPPV (benign paroxysmal positional vertigo) 09/07/2013  . Closed fracture of pubic ramus ([place]) 09/07/2013  . Colon polyps   . Constipation   . Decreased hearing of both ears 08/19/2016   Last Assessment & Plan:  Concern over hearing loss. Slowly progressive hearing loss.  No history of ear surgery, trauma or infection. EXAMINATION shows normal external canals and tympanic membranes. AUDIOGRAM severe sensorineural hearing loss.  Symmetric in nature. PLAN: Reassured I do not see anything bad going on.   She is a candidate for hearing aids.   will check her benefits.  . Gastric ulcer    years ago  . History of anemia 04/17/2012  . History of TIAs 04/17/2012   June 2011   . Hypertension   . Hypokalemia   . Pelvic fracture ([place]) 08/28/2013  . Personal history of colonic polyps 10/08/2011   Index polypectomy 2013   . Restless legs syndrome    on Requip  . S/P total hysterectomy and bilateral salpingo-oophorectomy 04/17/2012  . Situational stress   . Spinal stenosis   . Spinal stenosis, lumbar 04/17/2012  . Spinal stenosis, lumbar region, with neurogenic claudication 09/07/2013  . Stroke ([place])    Mini, no residual  . TIA (transient ischemic attack)   . Ulcer causing bleeding and hole in wall of stomach or small intestine   . Urinary bladder calculus   . Urinary incontinence   . Vertigo     Past Surgical History:  Procedure Laterality Date  . COLON RESECTION  50 years ago  . LUMBAR LAMINECTOMY/DECOMPRESSION MICRODISCECTOMY  06/25/2011   Procedure: LUMBAR LAMINECTOMY/DECOMPRESSION MICRODISCECTOMY;  Surgeon: Hosie Spangle, MD;  Location: [facility];  Service: Neurosurgery;  Laterality: Right;  RIGHT Lumbar Two-Three hemilaminectomy and microdiskectomy  . TONSILLECTOMY    . TOTAL ABDOMINAL HYSTERECTOMY W/ BILATERAL SALPINGOOPHORECTOMY  04/17/2012  . UTERINE FIBROID SURGERY      Allergies as of 11/10/2018      Reactions   Amoxicillin Other (See Comments)   Reaction unknown   Baclofen Other (  See Comments)   "fuzzy in the eyes" and dizzy   Hctz [hydrochlorothiazide]    nausea   Valium [diazepam] Other (See Comments)   Pt became unresponsive and O2 sats dropped.      Medication List       Accurate as of November 10, 2018 11:59 PM. If you have any questions, ask your nurse or doctor.        acetaminophen 325 MG tablet Commonly known as: TYLENOL Take 325 mg by mouth. 2 tabs p.o. every 6 hours PRN. Notify MD if not relieved. Not to exceed 3000mg  in 24 hour period   ammonium  lactate 12 % lotion Commonly known as: LAC-HYDRIN Apply 1 application topically daily. Apply to bilateral feet   calcium carbonate 500 MG chewable tablet Commonly known as: TUMS - dosed in mg elemental calcium Chew 1 tablet by mouth 3 (three) times daily with meals.   doxycycline 100 MG tablet Commonly known as: VIBRA-TABS Take 100 mg by mouth 2 (two) times daily.   Ensure Clear Liqd Take by mouth 3 (three) times daily.   feeding supplement (PRO-STAT SUGAR FREE 64) Liqd Take 30 mLs by mouth 2 (two) times a day.   ferrous sulfate 325 (65 FE) MG tablet Take 325 mg by mouth 2 (two) times daily.   lidocaine 5 % Commonly known as: LIDODERM Place 1 patch onto the skin. APPLY 1 PATCH TO RIGHT SHOULDER EVERY 12 HOURS FOR CHRONIC PAIN (ON FOR 12 HOURS AND OFF 12 HOURS)   lubiprostone 24 MCG capsule Commonly known as: AMITIZA Take 24 mcg by mouth 2 (two) times daily with a meal.   Melatonin 3 MG Tabs Take 1 tablet by mouth at bedtime.   menthol-cetylpyridinium 3 MG lozenge Commonly known as: CEPACOL Take 1 lozenge by mouth as needed for sore throat. TAKE 1 BY MOUTH EVERY 2 HOURS PRN FOR SORE THROAT. NOTIFY OPTUM IF WORSENING SYMPTOM OR FOR TEMP GREATER THAN 99   mirtazapine 7.5 MG tablet Commonly known as: REMERON Take 7.5 mg by mouth at bedtime.   morphine 15 MG 12 hr tablet Commonly known as: MS CONTIN Take 1 tablet (15 mg total) by mouth every 12 (twelve) hours.   multivitamin tablet Take 1 tablet by mouth daily.   Myrbetriq 50 MG Tb24 tablet Generic drug: mirabegron ER Take 50 mg by mouth daily. bladder   ondansetron 4 MG tablet Commonly known as: ZOFRAN Take 4 mg by mouth every 12 (twelve) hours as needed for nausea or vomiting.   oxyCODONE 5 MG immediate release tablet Commonly known as: Oxy IR/ROXICODONE Take 1 tablet (5 mg total) by mouth daily. At 12 pm   potassium chloride 10 MEQ tablet Commonly known as: K-DUR Take 20 mEq by mouth daily. 2 tablets    Protonix 40 MG tablet Generic drug: pantoprazole Take 40 mg by mouth daily.   rOPINIRole 4 MG tablet Commonly known as: REQUIP Take 4 mg by mouth at bedtime.   Systane Balance 0.6 % Soln Generic drug: Propylene Glycol Place 1 drop into both eyes 2 (two) times daily.   torsemide 20 MG tablet Commonly known as: DEMADEX Take 40 mg by mouth every Tuesday, Thursday, Saturday, and Sunday.   torsemide 20 MG tablet Commonly known as: DEMADEX Take 60 mg by mouth every Monday, Wednesday, and Friday. 3 tabs to = 60 mg   vitamin C 500 MG tablet Commonly known as: ASCORBIC ACID Take 500 mg by mouth daily.   Vitamin D3 1.25 MG (50000  UT) Caps Take 1 capsule by mouth every 30 (thirty) days.       No orders of the defined types were placed in this encounter.   Immunization History  Administered Date(s) Administered  . Influenza Split 04/15/2012  . Influenza,inj,Quad PF,6+ Mos 03/06/2014  . Influenza-Unspecified 03/01/2016, 05/01/2017, 03/12/2018  . PPD Test 08/30/2014, 09/18/2014  . Pneumococcal Conjugate-13 09/25/2017  . Pneumococcal Polysaccharide-23 08/29/2013    Social History   Tobacco Use  . Smoking status: Never Smoker  . Smokeless tobacco: Never Used  Substance Use Topics  . Alcohol use: Yes    Alcohol/week: 0.0 standard drinks    Comment: rarely    Review of Systems  DATA OBTAINED: from patient- very limited as per reliable nurse; nursing-no acute concerns GENERAL:  no fevers, fatigue, appetite changes SKIN: No itching, rash HEENT: No complaint RESPIRATORY: No cough, wheezing, SOB CARDIAC: No chest pain, palpitations, lower extremity edema  GI: No abdominal pain, No N/V/D or constipation, No heartburn or reflux  GU: No dysuria, frequency or urgency, or incontinence  MUSCULOSKELETAL: No unrelieved bone/joint pain NEUROLOGIC: No headache, dizziness  PSYCHIATRIC: No overt anxiety or sadness  Vitals:   11/10/18 1338  BP: (!) 92/57  Pulse: 82  Resp: 16   Temp: 98.5 F (36.9 C)  SpO2: 96%   Body mass index is 16.83 kg/m. Physical Exam  GENERAL APPEARANCE: Alert, conversant, No acute distress  SKIN: No diaphoresis rash HEENT: Unremarkable RESPIRATORY: Breathing is even, unlabored. Lung sounds are clear   CARDIOVASCULAR: Heart RRR no murmurs, rubs or gallops. No peripheral edema  GASTROINTESTINAL: Abdomen is soft, non-tender, not distended w/ normal bowel sounds.  GENITOURINARY: Bladder non tender, not distended  MUSCULOSKELETAL: No abnormal joints or musculature NEUROLOGIC: Cranial nerves 2-12 grossly intact. Moves all extremities PSYCHIATRIC: Mood and affect [place], no behavioral issues  Patient Active Problem List   Diagnosis Date Noted  . Sacral wound 11/06/2018  . Cellulitis of sacral region 11/06/2018  . Dementia without behavioral disturbance ([place]) 07/29/2018  . Anxiety disorder, unspecified 07/23/2017  . Iron deficiency anemia 03/29/2017  . GERD (gastroesophageal reflux disease) 12/22/2016  . Decreased hearing of both ears 08/19/2016  . Pressure injury of skin 08/15/2016  . Aspiration pneumonia ([place]) 08/14/2016  . Chronic diastolic CHF (congestive heart failure) ([place]) 08/14/2016  . Chronic right shoulder pain 02/09/2016  . Bilateral lower extremity edema 02/02/2016  . Ileitis 01/19/2016  . Pressure ulcer 01/09/2016  . SBO (small bowel obstruction) ([place]) 01/08/2016  . Narcotic addiction ([place]) 05/15/2015  . Diarrhea 03/30/2015  . Acute pharyngitis 02/26/2015  . Osteoarthritis of right knee 02/22/2015  . Pain in joint, lower leg 10/31/2014  . Ulcer of sacral region, stage 4 ([place]) 04/15/2014  . Moderate aortic stenosis 04/14/2014  . Pre-syncope 04/14/2014  . Hypotension 04/14/2014  . Acute renal failure superimposed on stage 2 chronic kidney disease ([place]) 04/14/2014  . Anemia 04/14/2014  . Protein-calorie malnutrition, severe ([place]) 04/14/2014  . Altered mental status   . Dehydration 04/13/2014  . Altered mental  state 04/13/2014  . Acute kidney injury ([place])   . Hypokalemia 03/25/2014  . Syncope 03/16/2014  . Acute encephalopathy 03/16/2014  . Leg swelling 03/02/2014  . Avascular necrosis of bone of right hip ([place]) 09/27/2013  . Primary osteoarthritis of right hip 09/07/2013  . Spinal stenosis, lumbar region, with neurogenic claudication 09/07/2013  . Closed fracture of pubic ramus ([place]) 09/07/2013  . BPPV (benign paroxysmal positional vertigo) 09/07/2013  . Pharyngeal swelling 08/30/2013  . Pelvic fracture (  [place]) 08/28/2013  . Fall 08/26/2013  . Dizziness 08/26/2013  . Abnormal stress test 08/26/2013  . Preop cardiovascular exam 08/01/2013  . Aortic stenosis 08/01/2013  . Edema 06/30/2013  . Loss of weight 01/04/2013  . Situational stress 01/04/2013  . Chronic right hip pain 07/05/2012  . Knee pain, acute 06/29/2012  . Vertigo 06/24/2012  . Acute exacerbation of chronic low back pain 06/24/2012  . Lumbar pain with radiation down right leg 06/09/2012  . Osteoarthritis of hip 04/29/2012  . History of TIAs 04/17/2012  . S/P total hysterectomy and bilateral salpingo-oophorectomy 04/17/2012  . Hard of hearing 04/17/2012  . History of anemia 04/17/2012  . RLS (restless legs syndrome) 04/17/2012  . Chronic back pain 04/17/2012  . Spinal stenosis, lumbar 04/17/2012  . Lower back pain 12/20/2011  . Personal history of colonic polyps 10/08/2011  . H/O 09/14/2011  . Essential hypertension 09/14/2011  . Restless legs syndrome 09/14/2011  . Constipation 08/20/2011  . Urinary incontinence 08/20/2011    CMP     Component Value Date/Time   NA 143 05/31/2018   K 3.6 05/31/2018   CL 106 08/17/2016 0343   CO2 28 08/17/2016 0343   GLUCOSE 92 08/17/2016 0343   BUN 38 (A) 05/31/2018   CREATININE 1.1 05/31/2018   CREATININE 1.13 (H) 08/17/2016 0343   CREATININE 0.78 06/28/2014 1205   CALCIUM 7.8 (L) 08/17/2016 0343   PROT 6.6 08/14/2016 1519   ALBUMIN 3.4 (L) 08/14/2016 1519   AST 14  03/01/2018   ALT 7 03/01/2018   ALKPHOS 94 03/01/2018   BILITOT 1.1 08/14/2016 1519   GFRNONAA 40 (L) 08/17/2016 0343   GFRAA 46 (L) 08/17/2016 0343   Recent Labs    03/01/18 05/31/18  NA 140 143  K 3.9 3.6  BUN 38* 38*  CREATININE 1.0 1.1   Recent Labs    03/01/18  AST 14  ALT 7  ALKPHOS 94   Recent Labs    03/01/18 05/31/18  WBC 8.1 7.6  HGB 8.4* 8.4*  HCT 25* 24*  PLT 285 300   Recent Labs    03/01/18  CHOL 128  LDLCALC 65  TRIG 65   No results found for: [facility] Lab Results  Component Value Date   TSH 2.02 03/01/2018   Lab Results  Component Value Date   HGBA1C 6.3 03/01/2018   Lab Results  Component Value Date   CHOL 128 03/01/2018   HDL 50 03/01/2018   LDLCALC 65 03/01/2018   TRIG 65 03/01/2018   CHOLHDL 2.8 03/06/2014    Significant Diagnostic Results in last 30 days:  No results found.  Assessment and Plan  Essential hypertension Controlled on diuretics; continue Demadex 60 mg Monday Wednesday Friday and 40 mg daily every other day  GERD (gastroesophageal reflux disease) No reports of reflux or aspiration; continue Protonix 40 mg  Dementia without behavioral disturbance ([place]) Chronic and progressive; continue supportive care    [name], MD

## 2018-11-12 ENCOUNTER — Encounter: Payer: Self-pay | Admitting: Internal Medicine

## 2018-11-12 NOTE — Assessment & Plan Note (Signed)
No reports of reflux or aspiration; continue Protonix 40 mg

## 2018-11-12 NOTE — Assessment & Plan Note (Signed)
Controlled on diuretics; continue Demadex 60 mg Monday Wednesday Friday and 40 mg daily every other day

## 2018-11-12 NOTE — Assessment & Plan Note (Signed)
Chronic and progressive; continue supportive care 

## 2018-12-06 ENCOUNTER — Other Ambulatory Visit: Payer: Self-pay | Admitting: Internal Medicine

## 2018-12-06 MED ORDER — OXYCODONE HCL 5 MG PO TABS: 5.0000 mg | ORAL_TABLET | Freq: Every day | ORAL | 0 refills | Status: DC

## 2018-12-09 LAB — BASIC METABOLIC PANEL
BUN: 53 — AB (ref 4–21)
Creatinine: 1.1 (ref 0.5–1.1)
Glucose: 90
Potassium: 3.8 (ref 3.4–5.3)
Sodium: 139 (ref 137–147)

## 2018-12-09 LAB — CBC AND DIFFERENTIAL
HCT: 32 — AB (ref 36–46)
Hemoglobin: 10.4 — AB (ref 12.0–16.0)
Platelets: 278 (ref 150–399)
WBC: 6.4

## 2018-12-10 ENCOUNTER — Encounter: Payer: Self-pay | Admitting: Internal Medicine

## 2018-12-10 ENCOUNTER — Non-Acute Institutional Stay (SKILLED_NURSING_FACILITY): Payer: Medicare Other | Admitting: Internal Medicine

## 2018-12-10 DIAGNOSIS — I5032 Chronic diastolic (congestive) heart failure: Secondary | ICD-10-CM

## 2018-12-10 DIAGNOSIS — M87051 Idiopathic aseptic necrosis of right femur: Secondary | ICD-10-CM | POA: Diagnosis not present

## 2018-12-10 DIAGNOSIS — R600 Localized edema: Secondary | ICD-10-CM | POA: Diagnosis not present

## 2018-12-10 NOTE — Progress Notes (Signed)
Location:  [facility] Room Number: 303-D Place of Service:  SNF (31)  [name], MD  Patient Care Team: [name], MD as PCP - General (Internal Medicine) [name], MD as Consulting Physician (Orthopedic Surgery) [name], MD as Consulting Physician (Otolaryngology) Mast, Man X, NP as Nurse Practitioner (Internal Medicine)  Extended Emergency Contact Information Primary Emergency Contact: [name] Address: [place], [place] [address] [place] Phone: [phone] Relation: Daughter Secondary Emergency Contact: [place], [place] Mobile Phone: [phone] Relation: Son    Allergies: Amoxicillin, Baclofen, Hctz [hydrochlorothiazide], and Valium [diazepam]  Chief Complaint  Patient presents with  . Medical Management of Chronic Issues    Routine [facility] visit    HPI: Patient is a 85 y.o. female who is being seen for routine issues of chronic diastolic congestive heart failure, avascular necrosis right hip, and bilateral lower extremity edema.  Past Medical History:  Diagnosis Date  . Abnormal stress test 08/26/2013  . Acute encephalopathy   . AKI (acute kidney injury) ([place])   . Altered mental state   . Anemia   . Anxiety disorder, unspecified   . Aortic stenosis   . Arthritis    Back, knees  . Avascular necrosis of bone of right hip ([place]) 09/27/2013  . Bowel incontinence   . BPPV (benign paroxysmal positional vertigo) 09/07/2013  . Closed fracture of pubic ramus ([place]) 09/07/2013  . Colon polyps   . Constipation   . Decreased hearing of both ears 08/19/2016   Last Assessment & Plan:  Concern over hearing loss. Slowly progressive hearing loss.  No history of ear surgery, trauma or infection. EXAMINATION shows normal external canals and tympanic membranes. AUDIOGRAM severe sensorineural hearing loss.  Symmetric in nature.  PLAN: Reassured I do not see anything bad going on.  She is a candidate for hearing aids.   will check her benefits.  . Gastric ulcer    years ago  . History of anemia 04/17/2012  . History of TIAs 04/17/2012   June 2011   . Hypertension   . Hypokalemia   . Pelvic fracture ([place]) 08/28/2013  . Personal history of colonic polyps 10/08/2011   Index polypectomy 2013   . Restless legs syndrome    on Requip  . S/P total hysterectomy and bilateral salpingo-oophorectomy 04/17/2012  . Situational stress   . Spinal stenosis   . Spinal stenosis, lumbar 04/17/2012  . Spinal stenosis, lumbar region, with neurogenic claudication 09/07/2013  . Stroke ([place])    Mini, no residual  . TIA (transient ischemic attack)   . Ulcer causing bleeding and hole in wall of stomach or small intestine   . Urinary bladder calculus   . Urinary incontinence   . Vertigo     Past Surgical History:  Procedure Laterality Date  . COLON RESECTION  50 years ago  . LUMBAR LAMINECTOMY/DECOMPRESSION MICRODISCECTOMY  06/25/2011   Procedure: LUMBAR LAMINECTOMY/DECOMPRESSION MICRODISCECTOMY;  Surgeon: Hosie Spangle, MD;  Location: [facility];  Service: Neurosurgery;  Laterality: Right;  RIGHT Lumbar Two-Three hemilaminectomy and microdiskectomy  . TONSILLECTOMY    . TOTAL ABDOMINAL HYSTERECTOMY W/ BILATERAL SALPINGOOPHORECTOMY  04/17/2012  . UTERINE FIBROID SURGERY      Allergies as of 12/10/2018      Reactions   Amoxicillin Other (See Comments)   Reaction  unknown   Baclofen Other (See Comments)   "fuzzy in the eyes" and dizzy   Hctz [hydrochlorothiazide]    nausea   Valium [diazepam] Other (See Comments)   Pt became unresponsive and O2 sats dropped.      Medication List       Accurate as of December 10, 2018 11:59 PM. If you have any questions, ask your nurse or doctor.        acetaminophen 325 MG tablet Commonly known as: TYLENOL Take 325 mg by mouth. 2 tabs p.o. every 6 hours PRN. Notify MD if not relieved.  Not to exceed 3000mg  in 24 hour period   ammonium lactate 12 % lotion Commonly known as: LAC-HYDRIN Apply 1 application topically daily. Apply to bilateral feet   calcium carbonate 500 MG chewable tablet Commonly known as: TUMS - dosed in mg elemental calcium Chew 1 tablet by mouth 3 (three) times daily with meals.   Ensure Clear Liqd Take by mouth 3 (three) times daily.   feeding supplement (PRO-STAT SUGAR FREE 64) Liqd Take 30 mLs by mouth 2 (two) times a day.   ferrous sulfate 325 (65 FE) MG tablet Take 325 mg by mouth 2 (two) times daily.   lidocaine 5 % Commonly known as: LIDODERM Place 1 patch onto the skin. APPLY 1 PATCH TO RIGHT SHOULDER EVERY 12 HOURS FOR CHRONIC PAIN (ON FOR 12 HOURS AND OFF 12 HOURS)   lubiprostone 24 MCG capsule Commonly known as: AMITIZA Take 24 mcg by mouth 2 (two) times daily with a meal.   Melatonin 3 MG Tabs Take 1 tablet by mouth at bedtime.   menthol-cetylpyridinium 3 MG lozenge Commonly known as: CEPACOL Take 1 lozenge by mouth every 2 (two) hours as needed for sore throat.   mirtazapine 7.5 MG tablet Commonly known as: REMERON Take 7.5 mg by mouth at bedtime.   morphine 15 MG 12 hr tablet Commonly known as: MS CONTIN Take 1 tablet (15 mg total) by mouth every 12 (twelve) hours.   multivitamin tablet Take 1 tablet by mouth daily.   Myrbetriq 50 MG Tb24 tablet Generic drug: mirabegron ER Take 50 mg by mouth daily. bladder   ondansetron 4 MG tablet Commonly known as: ZOFRAN Take 4 mg by mouth every 12 (twelve) hours as needed for nausea or vomiting.   oxyCODONE 5 MG immediate release tablet Commonly known as: Oxy IR/ROXICODONE Take 1 tablet (5 mg total) by mouth daily. At 12 pm   potassium chloride 10 MEQ tablet Commonly known as: K-DUR Take 20 mEq by mouth daily. 2 tablets   Protonix 40 MG tablet Generic drug: pantoprazole Take 40 mg by mouth daily.   rOPINIRole 4 MG tablet Commonly known as: REQUIP Take 4 mg by  mouth at bedtime.   Systane Balance 0.6 % Soln Generic drug: Propylene Glycol Place 1 drop into both eyes 2 (two) times daily.   torsemide 20 MG tablet Commonly known as: DEMADEX Take 40 mg by mouth every Tuesday, Thursday, Saturday, and Sunday.   torsemide 20 MG tablet Commonly known as: DEMADEX Take 60 mg by mouth every Monday, Wednesday, and Friday. 3 tabs to = 60 mg   vitamin C 500 MG tablet Commonly known as: ASCORBIC ACID Take 500 mg by mouth daily.   Vitamin D3 1.25 MG (50000 UT) Caps Take 1 capsule by mouth every 30 (thirty) days.       No orders of the defined types were placed in this encounter.   Immunization History  Administered Date(s) Administered  . Influenza Split 04/15/2012  . Influenza,inj,Quad PF,6+ Mos 03/06/2014  . Influenza-Unspecified 03/01/2016, 05/01/2017, 03/12/2018  . PPD Test 08/30/2014, 09/18/2014  . Pneumococcal Conjugate-13 09/25/2017  . Pneumococcal Polysaccharide-23 08/29/2013    Social History   Tobacco Use  . Smoking status: Never Smoker  . Smokeless tobacco: Never Used  Substance Use Topics  . Alcohol use: Yes    Alcohol/week: 0.0 standard drinks    Comment: rarely    Review of Systems  DATA OBTAINED: from patient- limited; nursing-no acute concerns GENERAL:  no fevers, fatigue, appetite changes SKIN: No itching, rash HEENT: No complaint RESPIRATORY: No cough, wheezing, SOB CARDIAC: No chest pain, palpitations, lower extremity edema  GI: No abdominal pain, No N/V/D or constipation, No heartburn or reflux  GU: No dysuria, frequency or urgency, or incontinence  MUSCULOSKELETAL: No unrelieved bone/joint pain NEUROLOGIC: No headache, dizziness  PSYCHIATRIC: No overt anxiety or sadness  Vitals:   12/10/18 1019  Resp: 18  Temp: 97.9 F (36.6 C)   Body mass index is 16.05 kg/m. Physical Exam  GENERAL APPEARANCE: Alert, conversant, No acute distress  SKIN: No diaphoresis rash HEENT: Unremarkable RESPIRATORY:  Breathing is even, unlabored. Lung sounds are clear   CARDIOVASCULAR: Heart RRR no murmurs, rubs or gallops.  7 peripheral edema  GASTROINTESTINAL: Abdomen is soft, non-tender, not distended w/ normal bowel sounds.  GENITOURINARY: Bladder non tender, not distended  MUSCULOSKELETAL: No abnormal joints or musculature NEUROLOGIC: Cranial nerves 2-12 grossly intact. Moves all extremities PSYCHIATRIC: Mood and affect with dementia, no behavioral issues  Patient Active Problem List   Diagnosis Date Noted  . Sacral wound 11/06/2018  . Cellulitis of sacral region 11/06/2018  . Dementia without behavioral disturbance ([place]) 07/29/2018  . Anxiety disorder, unspecified 07/23/2017  . Iron deficiency anemia 03/29/2017  . GERD (gastroesophageal reflux disease) 12/22/2016  . Decreased hearing of both ears 08/19/2016  . Pressure injury of skin 08/15/2016  . Aspiration pneumonia ([place]) 08/14/2016  . Chronic diastolic CHF (congestive heart failure) ([place]) 08/14/2016  . Chronic right shoulder pain 02/09/2016  . Bilateral lower extremity edema 02/02/2016  . Ileitis 01/19/2016  . Pressure ulcer 01/09/2016  . SBO (small bowel obstruction) ([place]) 01/08/2016  . Narcotic addiction ([place]) 05/15/2015  . Diarrhea 03/30/2015  . Acute pharyngitis 02/26/2015  . Osteoarthritis of right knee 02/22/2015  . Pain in joint, lower leg 10/31/2014  . Ulcer of sacral region, stage 4 ([place]) 04/15/2014  . Moderate aortic stenosis 04/14/2014  . Pre-syncope 04/14/2014  . Hypotension 04/14/2014  . Acute renal failure superimposed on stage 2 chronic kidney disease ([place]) 04/14/2014  . Anemia 04/14/2014  . Protein-calorie malnutrition, severe ([place]) 04/14/2014  . Altered mental status   . Dehydration 04/13/2014  . Altered mental state 04/13/2014  . Acute kidney injury ([place])   . Hypokalemia 03/25/2014  . Syncope 03/16/2014  . Acute encephalopathy 03/16/2014  . Leg swelling 03/02/2014  . Avascular necrosis of bone of right hip  ([place]) 09/27/2013  . Primary osteoarthritis of right hip 09/07/2013  . Spinal stenosis, lumbar region, with neurogenic claudication 09/07/2013  . Closed fracture of pubic ramus ([place]) 09/07/2013  . BPPV (benign paroxysmal positional vertigo) 09/07/2013  . Pharyngeal swelling 08/30/2013  . Pelvic fracture ([place]) 08/28/2013  . Fall 08/26/2013  . Dizziness 08/26/2013  . Abnormal stress test 08/26/2013  . Preop cardiovascular exam 08/01/2013  . Aortic stenosis 08/01/2013  . Edema 06/30/2013  . Loss of weight 01/04/2013  . Situational stress 01/04/2013  . Chronic right  hip pain 07/05/2012  . Knee pain, acute 06/29/2012  . Vertigo 06/24/2012  . Acute exacerbation of chronic low back pain 06/24/2012  . Lumbar pain with radiation down right leg 06/09/2012  . Osteoarthritis of hip 04/29/2012  . History of TIAs 04/17/2012  . S/P total hysterectomy and bilateral salpingo-oophorectomy 04/17/2012  . Hard of hearing 04/17/2012  . History of anemia 04/17/2012  . RLS (restless legs syndrome) 04/17/2012  . Chronic back pain 04/17/2012  . Spinal stenosis, lumbar 04/17/2012  . Lower back pain 12/20/2011  . Personal history of colonic polyps 10/08/2011  . H/O 09/14/2011  . Essential hypertension 09/14/2011  . Restless legs syndrome 09/14/2011  . Constipation 08/20/2011  . Urinary incontinence 08/20/2011    CMP     Component Value Date/Time   NA 143 05/31/2018   K 3.6 05/31/2018   CL 106 08/17/2016 0343   CO2 28 08/17/2016 0343   GLUCOSE 92 08/17/2016 0343   BUN 38 (A) 05/31/2018   CREATININE 1.1 05/31/2018   CREATININE 1.13 (H) 08/17/2016 0343   CREATININE 0.78 06/28/2014 1205   CALCIUM 7.8 (L) 08/17/2016 0343   PROT 6.6 08/14/2016 1519   ALBUMIN 3.4 (L) 08/14/2016 1519   AST 14 03/01/2018   ALT 7 03/01/2018   ALKPHOS 94 03/01/2018   BILITOT 1.1 08/14/2016 1519   GFRNONAA 40 (L) 08/17/2016 0343   GFRAA 46 (L) 08/17/2016 0343   Recent Labs    03/01/18 05/31/18  NA 140 143  K 3.9  3.6  BUN 38* 38*  CREATININE 1.0 1.1   Recent Labs    03/01/18  AST 14  ALT 7  ALKPHOS 94   Recent Labs    03/01/18 05/31/18  WBC 8.1 7.6  HGB 8.4* 8.4*  HCT 25* 24*  PLT 285 300   Recent Labs    03/01/18  CHOL 128  LDLCALC 65  TRIG 65   No results found for: [facility] Lab Results  Component Value Date   TSH 2.02 03/01/2018   Lab Results  Component Value Date   HGBA1C 6.3 03/01/2018   Lab Results  Component Value Date   CHOL 128 03/01/2018   HDL 50 03/01/2018   LDLCALC 65 03/01/2018   TRIG 65 03/01/2018   CHOLHDL 2.8 03/06/2014    Significant Diagnostic Results in last 30 days:  No results found.  Assessment and Plan  Chronic diastolic CHF (congestive heart failure) ([place]) Patient continues without exacerbation; continue Demadex 40 mg Tuesday Thursday and Saturday Sunday and 60 mg Monday and Friday  Avascular necrosis of bone of right hip Chronic and stable; patient treated with MS Contin 15 mg twice daily and 1 tablet of oxycodone 5 mg/day per pain clinic  Bilateral lower extremity edema Waxes and wanes but looking very good recently; continue Demadex 60 mg Monday Wednesday and Friday and 40 mg every other day     [name], MD

## 2018-12-12 ENCOUNTER — Encounter: Payer: Self-pay | Admitting: Internal Medicine

## 2018-12-12 NOTE — Assessment & Plan Note (Signed)
Chronic and stable; patient treated with MS Contin 15 mg twice daily and 1 tablet of oxycodone 5 mg/day per pain clinic

## 2018-12-12 NOTE — Assessment & Plan Note (Signed)
Patient continues without exacerbation; continue Demadex 40 mg Tuesday Thursday and Saturday Sunday and 60 mg Monday and Friday

## 2018-12-12 NOTE — Assessment & Plan Note (Signed)
Waxes and wanes but looking very good recently; continue Demadex 60 mg Monday Wednesday and Friday and 40 mg every other day

## 2018-12-14 LAB — VITAMIN D 25 HYDROXY (VIT D DEFICIENCY, FRACTURES): Vit D, 25-Hydroxy: 45.11

## 2018-12-17 ENCOUNTER — Encounter: Payer: Self-pay | Admitting: Internal Medicine

## 2018-12-17 ENCOUNTER — Non-Acute Institutional Stay (SKILLED_NURSING_FACILITY): Payer: Medicare Other | Admitting: Internal Medicine

## 2018-12-17 DIAGNOSIS — N390 Urinary tract infection, site not specified: Secondary | ICD-10-CM

## 2018-12-17 DIAGNOSIS — B9620 Unspecified Escherichia coli [E. coli] as the cause of diseases classified elsewhere: Secondary | ICD-10-CM | POA: Diagnosis not present

## 2018-12-17 NOTE — Progress Notes (Signed)
Location:  [facility] Room Number: 303-D Place of Service:  SNF (31)  [name], MD  Patient Care Team: [name], MD as PCP - General (Internal Medicine) [name], MD as Consulting Physician (Orthopedic Surgery) [name], MD as Consulting Physician (Otolaryngology) Mast, Man X, NP as Nurse Practitioner (Internal Medicine)  Extended Emergency Contact Information Primary Emergency Contact: [name] Address: [place], [place] [address] [place] Phone: [phone] Relation: Daughter Secondary Emergency Contact: [place], [place] Mobile Phone: [phone] Relation: Son    Allergies: Amoxicillin, Baclofen, Hctz [hydrochlorothiazide], and Valium [diazepam]  Chief Complaint  Patient presents with  . Acute Visit    Patient is seen for a urinary tract infection.    HPI: Patient is a 85 y.o. female who is being seen for a UTI.  Earlier this week urine was obtained because she it was foul-smelling.  Patient was not drinking much so she got some IV fluids.  Today the urine sensitivity came out.  Today patient is sitting up in the wheelchair dressed and looking not ill.  Past Medical History:  Diagnosis Date  . Abnormal stress test 08/26/2013  . Acute encephalopathy   . AKI (acute kidney injury) ([place])   . Altered mental state   . Anemia   . Anxiety disorder, unspecified   . Aortic stenosis   . Arthritis    Back, knees  . Avascular necrosis of bone of right hip ([place]) 09/27/2013  . Bowel incontinence   . BPPV (benign paroxysmal positional vertigo) 09/07/2013  . Closed fracture of pubic ramus ([place]) 09/07/2013  . Colon polyps   . Constipation   . Decreased hearing of both ears 08/19/2016   Last Assessment & Plan:  Concern over hearing loss. Slowly progressive hearing loss.  No history of ear surgery, trauma or infection.  EXAMINATION shows normal external canals and tympanic membranes. AUDIOGRAM severe sensorineural hearing loss.  Symmetric in nature. PLAN: Reassured I do not see anything bad going on.  She is a candidate for hearing aids.   will check her benefits.  . Gastric ulcer    years ago  . History of anemia 04/17/2012  . History of TIAs 04/17/2012   June 2011   . Hypertension   . Hypokalemia   . Pelvic fracture ([place]) 08/28/2013  . Personal history of colonic polyps 10/08/2011   Index polypectomy 2013   . Restless legs syndrome    on [place]qu[place]  . S/P total hysterectomy and bilateral salpingo-oophorectomy 04/17/2012  . Situational stress   . Spinal stenosis   . Spinal stenosis, lumbar 04/17/2012  . Spinal stenosis, lumbar region, with neurogenic claudication 09/07/2013  . Stroke ([place])    Mini, no residual  . TIA (transient ischemic attack)   . Ulcer causing bleeding and hole in wall of stomach or small intestine   . Urinary bladder calculus   . Urinary incontinence   . Vertigo     Past Surgical History:  Procedure Laterality Date  . COLON RESECTION  50 years ago  . LUMBAR LAMINECTOMY/DECOMPRESSION MICRODISCECTOMY  06/25/2011   Procedure: LUMBAR LAMINECTOMY/DECOMPRESSION MICRODISCECTOMY;  Surgeon: Hosie Spangle, MD;  Location: [facility];  Service: Neurosurgery;  Laterality: Right;  RIGHT Lumbar Two-Three hemilaminectomy and microdiskectomy  . TONSILLECTOMY    . TOTAL ABDOMINAL HYSTERECTOMY W/  BILATERAL SALPINGOOPHORECTOMY  04/17/2012  . UTERINE FIBROID SURGERY      Allergies as of 12/17/2018      Reactions   Amoxicillin Other (See Comments)   Reaction unknown   Baclofen Other (See Comments)   "fuzzy in the eyes" and dizzy   Hctz [hydrochlorothiazide]    nausea   Valium [diazepam] Other (See Comments)   Pt became unresponsive and O2 sats dropped.      Medication List       Accurate as of December 17, 2018  5:01 PM. If you have any questions, ask your nurse or doctor.         acetaminophen 325 MG tablet Commonly known as: TYLENOL Take 325 mg by mouth. 2 tabs p.o. every 6 hours PRN. Notify MD if not relieved. Not to exceed 3000mg  in 24 hour period   ammonium lactate 12 % lotion Commonly known as: LAC-HYDRIN Apply 1 application topically daily. Apply to bilateral feet   aspirin 81 MG chewable tablet Chew 81 mg by mouth daily.   calcium carbonate 500 MG chewable tablet Commonly known as: TUMS - dosed in mg elemental calcium Chew 1 tablet by mouth 3 (three) times daily with meals.   ciprofloxacin 250 MG tablet Commonly known as: CIPRO Take 250 mg by mouth daily.   Ensure Clear Liqd Take by mouth 3 (three) times daily.   feeding supplement (PRO-STAT SUGAR FREE 64) Liqd Take 30 mLs by mouth 2 (two) times a day.   ferrous sulfate 325 (65 FE) MG tablet Take 325 mg by mouth 2 (two) times daily.   lidocaine 5 % Commonly known as: LIDODERM Place 1 patch onto the skin. APPLY 1 PATCH TO RIGHT SHOULDER EVERY 12 HOURS FOR CHRONIC PAIN (ON FOR 12 HOURS AND OFF 12 HOURS)   lubiprostone 24 MCG capsule Commonly known as: AMITIZA Take 24 mcg by mouth 2 (two) times daily with a meal.   Melatonin 3 MG Tabs Take 1 tablet by mouth at bedtime.   menthol-cetylpyridinium 3 MG lozenge Commonly known as: CEPACOL Take 1 lozenge by mouth every 2 (two) hours as needed for sore throat.   mirtazapine 7.5 MG tablet Commonly known as: REMERON Take 7.5 mg by mouth at bedtime.   morphine 15 MG 12 hr tablet Commonly known as: MS CONTIN Take 1 tablet (15 mg total) by mouth every 12 (twelve) hours.   multivitamin tablet Take 1 tablet by mouth daily.   Myrbetriq 50 MG Tb24 tablet Generic drug: mirabegron ER Take 50 mg by mouth daily. bladder   ondansetron 4 MG tablet Commonly known as: ZOFRAN Take 4 mg by mouth every 12 (twelve) hours as needed for nausea or vomiting.   oxyCODONE 5 MG immediate release tablet Commonly known as: Oxy IR/ROXICODONE Take 1 tablet (5 mg  total) by mouth daily. At 12 pm   potassium chloride 10 MEQ tablet Commonly known as: K-DUR Take 20 mEq by mouth daily. 2 tablets   Protonix 40 MG tablet Generic drug: pantoprazole Take 40 mg by mouth daily.   rOPINIRole 4 MG tablet Commonly known as: REQUIP Take 4 mg by mouth at bedtime.   sodium chloride 0.45 % solution Inject 1,000 mLs into the vein continuous. 100 mL/hr x2 liters   Systane Balance 0.6 % Soln Generic drug: Propylene Glycol Place 1 drop into both eyes 2 (two) times daily.   torsemide 20 MG tablet Commonly known as: DEMADEX Take 40 mg by mouth every Tuesday, Thursday, Saturday, and Sunday.  torsemide 20 MG tablet Commonly known as: DEMADEX Take 60 mg by mouth every Monday, Wednesday, and Friday. 3 tabs to = 60 mg   vitamin C 500 MG tablet Commonly known as: ASCORBIC ACID Take 500 mg by mouth daily.   Vitamin D3 1.25 MG (50000 UT) Caps Take 1 capsule by mouth once a week.       No orders of the defined types were placed in this encounter.   Immunization History  Administered Date(s) Administered  . Influenza Split 04/15/2012  . Influenza,inj,Quad PF,6+ Mos 03/06/2014  . Influenza-Unspecified 03/01/2016, 05/01/2017, 03/12/2018  . PPD Test 08/30/2014, 09/18/2014  . Pneumococcal Conjugate-13 09/25/2017  . Pneumococcal Polysaccharide-23 08/29/2013    Social History   Tobacco Use  . Smoking status: Never Smoker  . Smokeless tobacco: Never Used  Substance Use Topics  . Alcohol use: Yes    Alcohol/week: 0.0 standard drinks    Comment: rarely    Review of Systems  DATA OBTAINED: from patient-extremely limited; nursing-as per history of present illness GENERAL:  no fevers, fatigue, appetite changes SKIN: No itching, rash HEENT: No complaint RESPIRATORY: No cough, wheezing, SOB CARDIAC: No chest pain, palpitations, lower extremity edema  GI: No abdominal pain, No N/V/D or constipation, No heartburn or reflux  GU: No dysuria, frequency or  urgency, or incontinence  MUSCULOSKELETAL: No unrelieved bone/joint pain NEUROLOGIC: No headache, dizziness  PSYCHIATRIC: No overt anxiety or sadness  Vitals:   12/17/18 1649  BP: (!) 80/52  Pulse: (!) 54  Resp: 20  Temp: 98.5 F (36.9 C)   Body mass index is 15.58 kg/m. Physical Exam  GENERAL APPEARANCE: Alert, conversant, No acute distress  SKIN: No diaphoresis rash HEENT: Unremarkable RESPIRATORY: Breathing is even, unlabored. Lung sounds are clear   CARDIOVASCULAR: Heart RRR no murmurs, rubs or gallops. No peripheral edema  GASTROINTESTINAL: Abdomen is soft, non-tender, not distended w/ normal bowel sounds.  GENITOURINARY: Bladder non tender, not distended  MUSCULOSKELETAL: No abnormal joints or musculature NEUROLOGIC: Cranial nerves 2-12 grossly intact. Moves all extremities PSYCHIATRIC: Mood and affect appropriate with dementia, no behavioral issues  Patient Active Problem List   Diagnosis Date Noted  . Sacral wound 11/06/2018  . Cellulitis of sacral region 11/06/2018  . Dementia without behavioral disturbance ([place]) 07/29/2018  . Anxiety disorder, unspecified 07/23/2017  . Iron deficiency anemia 03/29/2017  . GERD (gastroesophageal reflux disease) 12/22/2016  . Decreased hearing of both ears 08/19/2016  . Pressure injury of skin 08/15/2016  . Aspiration pneumonia ([place]) 08/14/2016  . Chronic diastolic CHF (congestive heart failure) ([place]) 08/14/2016  . Chronic right shoulder pain 02/09/2016  . Bilateral lower extremity edema 02/02/2016  . Ileitis 01/19/2016  . Pressure ulcer 01/09/2016  . SBO (small bowel obstruction) ([place]) 01/08/2016  . Narcotic addiction ([place]) 05/15/2015  . Diarrhea 03/30/2015  . Acute pharyngitis 02/26/2015  . Osteoarthritis of right knee 02/22/2015  . Pain in joint, lower leg 10/31/2014  . Ulcer of sacral region, stage 4 ([place]) 04/15/2014  . Moderate aortic stenosis 04/14/2014  . Pre-syncope 04/14/2014  . Hypotension 04/14/2014  . Acute  renal failure superimposed on stage 2 chronic kidney disease ([place]) 04/14/2014  . Anemia 04/14/2014  . Protein-calorie malnutrition, severe ([place]) 04/14/2014  . Altered mental status   . Dehydration 04/13/2014  . Altered mental state 04/13/2014  . Acute kidney injury ([place])   . Hypokalemia 03/25/2014  . Syncope 03/16/2014  . Acute encephalopathy 03/16/2014  . Leg swelling 03/02/2014  . Avascular necrosis of bone of right hip (  [place]) 09/27/2013  . Primary osteoarthritis of right hip 09/07/2013  . Spinal stenosis, lumbar region, with neurogenic claudication 09/07/2013  . Closed fracture of pubic ramus ([place]) 09/07/2013  . BPPV (benign paroxysmal positional vertigo) 09/07/2013  . Pharyngeal swelling 08/30/2013  . Pelvic fracture ([place]) 08/28/2013  . Fall 08/26/2013  . Dizziness 08/26/2013  . Abnormal stress test 08/26/2013  . Preop cardiovascular exam 08/01/2013  . Aortic stenosis 08/01/2013  . Edema 06/30/2013  . Loss of weight 01/04/2013  . Situational stress 01/04/2013  . Chronic right hip pain 07/05/2012  . Knee pain, acute 06/29/2012  . Vertigo 06/24/2012  . Acute exacerbation of chronic low back pain 06/24/2012  . Lumbar pain with radiation down right leg 06/09/2012  . Osteoarthritis of hip 04/29/2012  . History of TIAs 04/17/2012  . S/P total hysterectomy and bilateral salpingo-oophorectomy 04/17/2012  . Hard of hearing 04/17/2012  . History of anemia 04/17/2012  . RLS (restless legs syndrome) 04/17/2012  . Chronic back pain 04/17/2012  . Spinal stenosis, lumbar 04/17/2012  . Lower back pain 12/20/2011  . Personal history of colonic polyps 10/08/2011  . H/O 09/14/2011  . Essential hypertension 09/14/2011  . Restless legs syndrome 09/14/2011  . Constipation 08/20/2011  . Urinary incontinence 08/20/2011    CMP     Component Value Date/Time   NA 143 05/31/2018   K 3.6 05/31/2018   CL 106 08/17/2016 0343   CO2 28 08/17/2016 0343   GLUCOSE 92 08/17/2016 0343   BUN 38  (A) 05/31/2018   CREATININE 1.1 05/31/2018   CREATININE 1.13 (H) 08/17/2016 0343   CREATININE 0.78 06/28/2014 1205   CALCIUM 7.8 (L) 08/17/2016 0343   PROT 6.6 08/14/2016 1519   ALBUMIN 3.4 (L) 08/14/2016 1519   AST 14 03/01/2018   ALT 7 03/01/2018   ALKPHOS 94 03/01/2018   BILITOT 1.1 08/14/2016 1519   GFRNONAA 40 (L) 08/17/2016 0343   GFRAA 46 (L) 08/17/2016 0343   Recent Labs    03/01/18 05/31/18  NA 140 143  K 3.9 3.6  BUN 38* 38*  CREATININE 1.0 1.1   Recent Labs    03/01/18  AST 14  ALT 7  ALKPHOS 94   Recent Labs    03/01/18 05/31/18  WBC 8.1 7.6  HGB 8.4* 8.4*  HCT 25* 24*  PLT 285 300   Recent Labs    03/01/18  CHOL 128  LDLCALC 65  TRIG 65   No results found for: [facility] Lab Results  Component Value Date   TSH 2.02 03/01/2018   Lab Results  Component Value Date   HGBA1C 6.3 03/01/2018   Lab Results  Component Value Date   CHOL 128 03/01/2018   HDL 50 03/01/2018   LDLCALC 65 03/01/2018   TRIG 65 03/01/2018   CHOLHDL 2.8 03/06/2014    Significant Diagnostic Results in last 30 days:  No results found.  Assessment and Plan  E. coli UTI- patient will be treated with Cipro 250 mg daily for 10 days; will be encouraged to drink fluids; will be monitored    [name], MD

## 2018-12-18 ENCOUNTER — Encounter: Payer: Self-pay | Admitting: Internal Medicine

## 2018-12-20 ENCOUNTER — Encounter: Payer: Self-pay | Admitting: Internal Medicine

## 2018-12-20 ENCOUNTER — Non-Acute Institutional Stay (SKILLED_NURSING_FACILITY): Payer: Medicare Other | Admitting: Internal Medicine

## 2018-12-20 DIAGNOSIS — N3000 Acute cystitis without hematuria: Secondary | ICD-10-CM

## 2018-12-20 DIAGNOSIS — U071 COVID-19: Secondary | ICD-10-CM

## 2018-12-20 NOTE — Progress Notes (Addendum)
Location:  [facility] Room Number: 376-E Place of Service:  SNF (31)  Shelly Duos, MD  Patient Care Team: Shelly Duos, MD as PCP - General (Internal Medicine) [name], MD as Consulting Physician (Orthopedic Surgery) [name], MD as Consulting Physician (Otolaryngology) Mast, Man X, NP as Nurse Practitioner (Internal Medicine)  Extended Emergency Contact Information Primary Emergency Contact: [name] Address: [place], [place] [address] [place] Phone: [phone] Relation: Daughter Secondary Emergency Contact: [place], [place] Mobile Phone: [phone] Relation: Son    Allergies: Amoxicillin, Baclofen, Hctz [hydrochlorothiazide], and Valium [diazepam]  Chief Complaint  Patient presents with  . Acute Visit    Patient is COVID-19 positive    HPI: Patient is a 85 y.o. female who is being seen because her COVID test came back positive.  Patient has no symptoms no cough no cold no fever no nausea vomiting diarrhea chest pain shortness of breath, sinus infection or any other systemic symptom.  Patient has been being treated for a UTI 1 her COVID test came back.  Past Medical History:  Diagnosis Date  . Abnormal stress test 08/26/2013  . Acute encephalopathy   . AKI (acute kidney injury) ([place])   . Altered mental state   . Anemia   . Anxiety disorder, unspecified   . Aortic stenosis   . Arthritis    Back, knees  . Avascular necrosis of bone of right hip ([place]) 09/27/2013  . Bowel incontinence   . BPPV (benign paroxysmal positional vertigo) 09/07/2013  . Closed fracture of pubic ramus ([place]) 09/07/2013  . Colon polyps   . Constipation   . Decreased hearing of both ears 08/19/2016   Last Assessment & Plan:  Concern over hearing loss. Slowly progressive hearing loss.  No history of ear surgery, trauma or infection. EXAMINATION  shows normal external canals and tympanic membranes. AUDIOGRAM severe sensorineural hearing loss.  Symmetric in nature. PLAN: Reassured I do not see anything bad going on.  She is a candidate for hearing aids.   will check her benefits.  . Gastric ulcer    years ago  . History of anemia 04/17/2012  . History of TIAs 04/17/2012   June 2011   . Hypertension   . Hypokalemia   . Pelvic fracture ([place]) 08/28/2013  . Personal history of colonic polyps 10/08/2011   Index polypectomy 2013   . Restless legs syndrome    on Requip  . S/P total hysterectomy and bilateral salpingo-oophorectomy 04/17/2012  . Situational stress   . Spinal stenosis   . Spinal stenosis, lumbar 04/17/2012  . Spinal stenosis, lumbar region, with neurogenic claudication 09/07/2013  . Stroke ([place])    Mini, no residual  . TIA (transient ischemic attack)   . Ulcer causing bleeding and hole in wall of stomach or small intestine   . Urinary bladder calculus   . Urinary incontinence   . Vertigo     Past Surgical History:  Procedure Laterality Date  . COLON RESECTION  50 years ago  . LUMBAR LAMINECTOMY/DECOMPRESSION MICRODISCECTOMY  06/25/2011   Procedure: LUMBAR LAMINECTOMY/DECOMPRESSION MICRODISCECTOMY;  Surgeon: Hosie Spangle, MD;  Location: [facility];  Service: Neurosurgery;  Laterality: Right;  RIGHT Lumbar Two-Three hemilaminectomy and microdiskectomy  . TONSILLECTOMY    . TOTAL ABDOMINAL HYSTERECTOMY W/ BILATERAL SALPINGOOPHORECTOMY  04/17/2012  . UTERINE FIBROID SURGERY      Allergies as of 12/20/2018      Reactions   Amoxicillin Other (See Comments)   Reaction unknown   Baclofen Other (See Comments)   "fuzzy in the eyes" and dizzy   Hctz [hydrochlorothiazide]    nausea   Valium [diazepam] Other (See Comments)   Pt became unresponsive and O2 sats dropped.      Medication List       Accurate as of December 20, 2018 11:59 PM. If you have any questions, ask your nurse or doctor.        STOP taking these  medications   sodium chloride 0.45 % solution Stopped by: Inocencio Homes, MD     TAKE these medications   acetaminophen 325 MG tablet Commonly known as: TYLENOL Take 325 mg by mouth. 2 tabs p.o. every 6 hours PRN. Notify MD if not relieved. Not to exceed 3000mg  in 24 hour period   ammonium lactate 12 % lotion Commonly known as: LAC-HYDRIN Apply 1 application topically daily. Apply to bilateral feet   aspirin 81 MG chewable tablet Chew 81 mg by mouth daily.   calcium carbonate 500 MG chewable tablet Commonly known as: TUMS - dosed in mg elemental calcium Chew 1 tablet by mouth 3 (three) times daily with meals.   ciprofloxacin 250 MG tablet Commonly known as: CIPRO Take 250 mg by mouth daily.   Ensure Clear Liqd Take by mouth 3 (three) times daily.   feeding supplement (PRO-STAT SUGAR FREE 64) Liqd Take 30 mLs by mouth 2 (two) times a day.   ferrous sulfate 325 (65 FE) MG tablet Take 325 mg by mouth 2 (two) times daily.   lidocaine 5 % Commonly known as: LIDODERM Place 1 patch onto the skin. APPLY 1 PATCH TO RIGHT SHOULDER EVERY 12 HOURS FOR CHRONIC PAIN (ON FOR 12 HOURS AND OFF 12 HOURS)   lubiprostone 24 MCG capsule Commonly known as: AMITIZA Take 24 mcg by mouth 2 (two) times daily with a meal.   Melatonin 3 MG Tabs Take 1 tablet by mouth at bedtime.   menthol-cetylpyridinium 3 MG lozenge Commonly known as: CEPACOL Take 1 lozenge by mouth every 2 (two) hours as needed for sore throat.   mirtazapine 7.5 MG tablet Commonly known as: REMERON Take 7.5 mg by mouth at bedtime.   morphine 15 MG 12 hr tablet Commonly known as: MS CONTIN Take 1 tablet (15 mg total) by mouth every 12 (twelve) hours.   multivitamin tablet Take 1 tablet by mouth daily.   Myrbetriq 50 MG Tb24 tablet Generic drug: mirabegron ER Take 50 mg by mouth daily. bladder   ondansetron 4 MG tablet Commonly known as: ZOFRAN Take 4 mg by mouth every 12 (twelve) hours as needed for nausea or  vomiting.   oxyCODONE 5 MG immediate release tablet Commonly known as: Oxy IR/ROXICODONE Take 1 tablet (5 mg total) by mouth daily. At 12 pm   potassium chloride 10 MEQ tablet Commonly known as: K-DUR Take 20 mEq by mouth daily. 2 tablets   Protonix 40 MG tablet Generic drug: pantoprazole Take 40 mg by mouth daily.   rOPINIRole 4 MG tablet Commonly known as: REQUIP Take 4 mg by mouth at bedtime.   Systane Balance 0.6 % Soln Generic drug: Propylene Glycol Place 1 drop into both eyes 2 (two) times daily.   torsemide 20 MG tablet Commonly known as: DEMADEX Take 40 mg by mouth every Tuesday, Thursday, Saturday, and  Sunday.   torsemide 20 MG tablet Commonly known as: DEMADEX Take 60 mg by mouth every Monday, Wednesday, and Friday. 3 tabs to = 60 mg   vitamin C 500 MG tablet Commonly known as: ASCORBIC ACID Take 500 mg by mouth daily.   Vitamin D3 1.25 MG (50000 UT) Caps Take 1 capsule by mouth once a week.       No orders of the defined types were placed in this encounter.   Immunization History  Administered Date(s) Administered  . Influenza Split 04/15/2012  . Influenza,inj,Quad PF,6+ Mos 03/06/2014  . Influenza-Unspecified 03/01/2016, 05/01/2017, 03/12/2018  . PPD Test 08/30/2014, 09/18/2014  . Pneumococcal Conjugate-13 09/25/2017  . Pneumococcal Polysaccharide-23 08/29/2013    Social History   Tobacco Use  . Smoking status: Never Smoker  . Smokeless tobacco: Never Used  Substance Use Topics  . Alcohol use: Yes    Alcohol/week: 0.0 standard drinks    Comment: rarely    Review of Systems  DATA OBTAINED: from patient, nurse GENERAL:  no fevers, fatigue, appetite changes SKIN: No itching, rash HEENT: No complaint RESPIRATORY: No cough, wheezing, SOB CARDIAC: No chest pain, palpitations, lower extremity edema  GI: No abdominal pain, No N/V/D or constipation, No heartburn or reflux  GU: No dysuria, frequency or urgency, or incontinence   MUSCULOSKELETAL: No unrelieved bone/joint pain NEUROLOGIC: No headache, dizziness  PSYCHIATRIC: No overt anxiety or sadness  Vitals:   12/20/18 1531  BP: 123/68  Pulse: 68  Resp: 20  Temp: (!) 97.5 F (36.4 C)   Body mass index is 15.58 kg/m. Physical Exam  GENERAL APPEARANCE: Alert, conversant, No acute distress  SKIN: No diaphoresis rash HEENT: Unremarkable RESPIRATORY: Breathing is even, unlabored. Lung sounds are clear   CARDIOVASCULAR: Heart RRR no murmurs, rubs or gallops. No peripheral edema  GASTROINTESTINAL: Abdomen is soft, non-tender, not distended w/ normal bowel sounds.  GENITOURINARY: Bladder non tender, not distended  MUSCULOSKELETAL: No abnormal joints or musculature NEUROLOGIC: Cranial nerves 2-12 grossly intact. Moves all extremities PSYCHIATRIC: Mood and affect pleasant but with dementia, no behavioral issues  Patient Active Problem List   Diagnosis Date Noted  . Real time reverse transcriptase PCR positive for COVID-19 virus 12/22/2018  . UTI (urinary tract infection) 12/22/2018  . Sacral wound 11/06/2018  . Cellulitis of sacral region 11/06/2018  . Dementia without behavioral disturbance ([place]) 07/29/2018  . Anxiety disorder, unspecified 07/23/2017  . Iron deficiency anemia 03/29/2017  . GERD (gastroesophageal reflux disease) 12/22/2016  . Decreased hearing of both ears 08/19/2016  . Pressure injury of skin 08/15/2016  . Aspiration pneumonia ([place]) 08/14/2016  . Chronic diastolic CHF (congestive heart failure) ([place]) 08/14/2016  . Chronic right shoulder pain 02/09/2016  . Bilateral lower extremity edema 02/02/2016  . Ileitis 01/19/2016  . Pressure ulcer 01/09/2016  . SBO (small bowel obstruction) ([place]) 01/08/2016  . Narcotic addiction ([place]) 05/15/2015  . Diarrhea 03/30/2015  . Acute pharyngitis 02/26/2015  . Osteoarthritis of right knee 02/22/2015  . Pain in joint, lower leg 10/31/2014  . Ulcer of sacral region, stage 4 ([place]) 04/15/2014  .  Moderate aortic stenosis 04/14/2014  . Pre-syncope 04/14/2014  . Hypotension 04/14/2014  . Acute renal failure superimposed on stage 2 chronic kidney disease ([place]) 04/14/2014  . Anemia 04/14/2014  . Protein-calorie malnutrition, severe ([place]) 04/14/2014  . Altered mental status   . Dehydration 04/13/2014  . Altered mental state 04/13/2014  . Acute kidney injury ([place])   . Hypokalemia 03/25/2014  . Syncope 03/16/2014  . Acute  encephalopathy 03/16/2014  . Leg swelling 03/02/2014  . Avascular necrosis of bone of right hip ([place]) 09/27/2013  . Primary osteoarthritis of right hip 09/07/2013  . Spinal stenosis, lumbar region, with neurogenic claudication 09/07/2013  . Closed fracture of pubic ramus ([place]) 09/07/2013  . BPPV (benign paroxysmal positional vertigo) 09/07/2013  . Pharyngeal swelling 08/30/2013  . Pelvic fracture ([place]) 08/28/2013  . Fall 08/26/2013  . Dizziness 08/26/2013  . Abnormal stress test 08/26/2013  . Preop cardiovascular exam 08/01/2013  . Aortic stenosis 08/01/2013  . Edema 06/30/2013  . Loss of weight 01/04/2013  . Situational stress 01/04/2013  . Chronic right hip pain 07/05/2012  . Knee pain, acute 06/29/2012  . Vertigo 06/24/2012  . Acute exacerbation of chronic low back pain 06/24/2012  . Lumbar pain with radiation down right leg 06/09/2012  . Osteoarthritis of hip 04/29/2012  . History of TIAs 04/17/2012  . S/P total hysterectomy and bilateral salpingo-oophorectomy 04/17/2012  . Hard of hearing 04/17/2012  . History of anemia 04/17/2012  . RLS (restless legs syndrome) 04/17/2012  . Chronic back pain 04/17/2012  . Spinal stenosis, lumbar 04/17/2012  . Lower back pain 12/20/2011  . Personal history of colonic polyps 10/08/2011  . H/O 09/14/2011  . Essential hypertension 09/14/2011  . Restless legs syndrome 09/14/2011  . Constipation 08/20/2011  . Urinary incontinence 08/20/2011    CMP     Component Value Date/Time   NA 143 05/31/2018   K 3.6  05/31/2018   CL 106 08/17/2016 0343   CO2 28 08/17/2016 0343   GLUCOSE 92 08/17/2016 0343   BUN 38 (A) 05/31/2018   CREATININE 1.1 05/31/2018   CREATININE 1.13 (H) 08/17/2016 0343   CREATININE 0.78 06/28/2014 1205   CALCIUM 7.8 (L) 08/17/2016 0343   PROT 6.6 08/14/2016 1519   ALBUMIN 3.4 (L) 08/14/2016 1519   AST 14 03/01/2018   ALT 7 03/01/2018   ALKPHOS 94 03/01/2018   BILITOT 1.1 08/14/2016 1519   GFRNONAA 40 (L) 08/17/2016 0343   GFRAA 46 (L) 08/17/2016 0343   Recent Labs    03/01/18 05/31/18  NA 140 143  K 3.9 3.6  BUN 38* 38*  CREATININE 1.0 1.1   Recent Labs    03/01/18  AST 14  ALT 7  ALKPHOS 94   Recent Labs    03/01/18 05/31/18  WBC 8.1 7.6  HGB 8.4* 8.4*  HCT 25* 24*  PLT 285 300   Recent Labs    03/01/18  CHOL 128  LDLCALC 65  TRIG 65   No results found for: [facility] Lab Results  Component Value Date   TSH 2.02 03/01/2018   Lab Results  Component Value Date   HGBA1C 6.3 03/01/2018   Lab Results  Component Value Date   CHOL 128 03/01/2018   HDL 50 03/01/2018   LDLCALC 65 03/01/2018   TRIG 65 03/01/2018   CHOLHDL 2.8 03/06/2014    Significant Diagnostic Results in last 30 days:  No results found.  Assessment and Plan  Real time reverse transcriptase PCR positive for COVID-19 virus Patient is asymptomatic but has been placed in isolation unit; patient is eating and drinking; patient is only "COVID cocktail" which includes vitamin C, vitamin D, zinc and ASA 81 mg daily; will monitor closely  UTI (urinary tract infection) Was being treated for UTI before her COVID 19 test came back positive.  We will continue Cipro 250 mg twice daily for total treatment time of 7 days.    Noah Delaine  [name], MD

## 2018-12-22 ENCOUNTER — Encounter: Payer: Self-pay | Admitting: Internal Medicine

## 2018-12-22 DIAGNOSIS — N390 Urinary tract infection, site not specified: Secondary | ICD-10-CM | POA: Insufficient documentation

## 2018-12-22 DIAGNOSIS — U071 COVID-19: Secondary | ICD-10-CM | POA: Insufficient documentation

## 2018-12-22 NOTE — Assessment & Plan Note (Signed)
Patient is asymptomatic but has been placed in isolation unit; patient is eating and drinking; patient is only "COVID cocktail" which includes vitamin C, vitamin D, zinc and ASA 81 mg daily; will monitor closely

## 2018-12-22 NOTE — Assessment & Plan Note (Signed)
Was being treated for UTI before her COVID 19 test came back positive.  We will continue Cipro 250 mg twice daily for total treatment time of 7 days.

## 2019-01-04 ENCOUNTER — Encounter: Payer: Self-pay | Admitting: Internal Medicine

## 2019-01-04 ENCOUNTER — Non-Acute Institutional Stay (SKILLED_NURSING_FACILITY): Payer: Medicare Other | Admitting: Internal Medicine

## 2019-01-04 DIAGNOSIS — I350 Nonrheumatic aortic (valve) stenosis: Secondary | ICD-10-CM

## 2019-01-04 DIAGNOSIS — M160 Bilateral primary osteoarthritis of hip: Secondary | ICD-10-CM

## 2019-01-04 DIAGNOSIS — G2581 Restless legs syndrome: Secondary | ICD-10-CM | POA: Diagnosis not present

## 2019-01-04 NOTE — Progress Notes (Signed)
Location:  [facility] Room Number: 315-V Place of Service:  SNF (31)  [name], MD  Patient Care Team: [name], MD as PCP - General (Internal Medicine) [name], MD as Consulting Physician (Orthopedic Surgery) [name], MD as Consulting Physician (Otolaryngology) Mast, Man X, NP as Nurse Practitioner (Internal Medicine)  Extended Emergency Contact Information Primary Emergency Contact: [name] Address: [place], [place] [address] [place] Phone: [phone] Relation: Daughter Secondary Emergency Contact: [place], [place] Mobile Phone: [phone] Relation: Son    Allergies: Amoxicillin, Baclofen, Hctz [hydrochlorothiazide], and Valium [diazepam]  Chief Complaint  Patient presents with  . Medical Management of Chronic Issues    Routine [facility] visit    HPI: Patient is a 85 y.o. female who is being seen for routine issues of aortic stenosis, restless leg syndrome, and osteoarthritis of hip.  Patient is still in the [facility] isolation unit where she has been treated for UTI and has had decreased p.o. intake and confusion but no symptoms of COVID per se.  Past Medical History:  Diagnosis Date  . Abnormal stress test 08/26/2013  . Acute encephalopathy   . AKI (acute kidney injury) ([place])   . Altered mental state   . Anemia   . Anxiety disorder, unspecified   . Aortic stenosis   . Arthritis    Back, knees  . Avascular necrosis of bone of right hip ([place]) 09/27/2013  . Bowel incontinence   . BPPV (benign paroxysmal positional vertigo) 09/07/2013  . Closed fracture of pubic ramus ([place]) 09/07/2013  . Colon polyps   . Constipation   . Decreased hearing of both ears 08/19/2016   Last Assessment & Plan:  Concern over hearing loss. Slowly progressive hearing loss.  No history of ear surgery, trauma or infection.  EXAMINATION shows normal external canals and tympanic membranes. AUDIOGRAM severe sensorineural hearing loss.  Symmetric in nature. PLAN: Reassured I do not see anything bad going on.  She is a candidate for hearing aids.   will check her benefits.  . Gastric ulcer    years ago  . History of anemia 04/17/2012  . History of TIAs 04/17/2012   June 2011   . Hypertension   . Hypokalemia   . Pelvic fracture ([place]) 08/28/2013  . Personal history of colonic polyps 10/08/2011   Index polypectomy 2013   . Restless legs syndrome    on Requip  . S/P total hysterectomy and bilateral salpingo-oophorectomy 04/17/2012  . Situational stress   . Spinal stenosis   . Spinal stenosis, lumbar 04/17/2012  . Spinal stenosis, lumbar region, with neurogenic claudication 09/07/2013  . Stroke ([place])    Mini, no residual  . TIA (transient ischemic attack)   . Ulcer causing bleeding and hole in wall of stomach or small intestine   . Urinary bladder calculus   . Urinary incontinence   . Vertigo     Past Surgical History:  Procedure Laterality Date  . COLON RESECTION  50 years ago  . LUMBAR LAMINECTOMY/DECOMPRESSION MICRODISCECTOMY  06/25/2011   Procedure: LUMBAR LAMINECTOMY/DECOMPRESSION MICRODISCECTOMY;  Surgeon: Hosie Spangle, MD;  Location: [facility];  Service: Neurosurgery;  Laterality: Right;  RIGHT Lumbar Two-Three hemilaminectomy and microdiskectomy  . TONSILLECTOMY    . TOTAL ABDOMINAL HYSTERECTOMY W/ BILATERAL SALPINGOOPHORECTOMY  04/17/2012  .  UTERINE FIBROID SURGERY      Allergies as of 01/04/2019      Reactions   Amoxicillin Other (See Comments)   Reaction unknown   Baclofen Other (See Comments)   "fuzzy in the eyes" and dizzy   Hctz [hydrochlorothiazide]    nausea   Valium [diazepam] Other (See Comments)   Pt became unresponsive and O2 sats dropped.      Medication List       Accurate as of January 04, 2019 11:59 PM. If you have any questions, ask your nurse or doctor.         STOP taking these medications   vitamin C 500 MG tablet Commonly known as: ASCORBIC ACID Stopped by: Inocencio Homes, MD     TAKE these medications   acetaminophen 325 MG tablet Commonly known as: TYLENOL Take 325 mg by mouth. 2 tabs p.o. every 6 hours PRN. Notify MD if not relieved. Not to exceed 3000mg  in 24 hour period   ammonium lactate 12 % lotion Commonly known as: LAC-HYDRIN Apply 1 application topically daily. Apply to bilateral feet   aspirin 81 MG chewable tablet Chew 81 mg by mouth daily.   calcium carbonate 500 MG chewable tablet Commonly known as: TUMS - dosed in mg elemental calcium Chew 1 tablet by mouth 3 (three) times daily with meals.   doxycycline 100 MG tablet Commonly known as: VIBRA-TABS Take 100 mg by mouth 2 (two) times daily. x10 days - ending 01/06/19   Ensure Clear Liqd Take by mouth 3 (three) times daily.   feeding supplement (PRO-STAT SUGAR FREE 64) Liqd Take 30 mLs by mouth 2 (two) times a day.   ferrous sulfate 325 (65 FE) MG tablet Take 325 mg by mouth 2 (two) times daily.   lidocaine 5 % Commonly known as: LIDODERM Place 1 patch onto the skin. APPLY 1 PATCH TO RIGHT SHOULDER EVERY 12 HOURS FOR CHRONIC PAIN (ON FOR 12 HOURS AND OFF 12 HOURS)   lubiprostone 24 MCG capsule Commonly known as: AMITIZA Take 24 mcg by mouth 2 (two) times daily with a meal.   Melatonin 3 MG Tabs Take 1 tablet by mouth at bedtime.   menthol-cetylpyridinium 3 MG lozenge Commonly known as: CEPACOL Take 1 lozenge by mouth every 2 (two) hours as needed for sore throat.   mirtazapine 7.5 MG tablet Commonly known as: REMERON Take 7.5 mg by mouth at bedtime.   morphine 15 MG 12 hr tablet Commonly known as: MS CONTIN Take 1 tablet (15 mg total) by mouth every 12 (twelve) hours.   multivitamin tablet Take 1 tablet by mouth daily.   Myrbetriq 50 MG Tb24 tablet Generic drug: mirabegron ER Take 50 mg by mouth daily. bladder   ondansetron 4 MG tablet  Commonly known as: ZOFRAN Take 4 mg by mouth every 12 (twelve) hours as needed for nausea or vomiting.   oxyCODONE 5 MG immediate release tablet Commonly known as: Oxy IR/ROXICODONE Take 1 tablet (5 mg total) by mouth daily. At 12 pm   potassium chloride 10 MEQ tablet Commonly known as: K-DUR Take 20 mEq by mouth daily. 2 tablets   Protonix 40 MG tablet Generic drug: pantoprazole Take 40 mg by mouth daily.   rOPINIRole 4 MG tablet Commonly known as: REQUIP Take 4 mg by mouth at bedtime.   Systane Balance 0.6 % Soln Generic drug: Propylene Glycol Place 1 drop into both eyes 2 (two) times daily.   torsemide 20 MG tablet Commonly known as: DEMADEX  Take 20 mg by mouth daily. What changed: Another medication with the same name was removed. Continue taking this medication, and follow the directions you see here. Changed by: Inocencio Homes, MD   Vitamin D3 1.25 MG (50000 UT) Caps Take 1 capsule by mouth once a week.       No orders of the defined types were placed in this encounter.   Immunization History  Administered Date(s) Administered  . Influenza Split 04/15/2012  . Influenza,inj,Quad PF,6+ Mos 03/06/2014  . Influenza-Unspecified 03/01/2016, 05/01/2017, 03/12/2018  . PPD Test 08/30/2014, 09/18/2014  . Pneumococcal Conjugate-13 09/25/2017  . Pneumococcal Polysaccharide-23 08/29/2013    Social History   Tobacco Use  . Smoking status: Never Smoker  . Smokeless tobacco: Never Used  Substance Use Topics  . Alcohol use: Yes    Alcohol/week: 0.0 standard drinks    Comment: rarely    Review of Systems  DATA OBTAINED: from nurse GENERAL:  no fevers, +fatigue,+ appetite changes SKIN: No itching, rash HEENT: No complaint RESPIRATORY: No cough, wheezing, SOB CARDIAC: No chest pain, palpitations, lower extremity edema  GI: No abdominal pain, No N/V/D or constipation, No heartburn or reflux  GU: No dysuria, frequency or urgency, or incontinence  MUSCULOSKELETAL:  No unrelieved bone/joint pain NEUROLOGIC: No headache, dizziness  PSYCHIATRIC: No overt anxiety or sadness  Vitals:   01/04/19 1040  BP: 110/60  Pulse: 82  Resp: 18  Temp: 98 F (36.7 C)   Body mass index is 15.1 kg/m. Physical Exam  GENERAL APPEARANCE: Alert, moderately conversant, No acute distress  SKIN: No diaphoresis rash HEENT: Unremarkable RESPIRATORY: Breathing is even, unlabored. Lung sounds are clear   CARDIOVASCULAR: Heart RRR 3/6 systolic murmur, no  rubs or gallops. No peripheral edema  GASTROINTESTINAL: Abdomen is soft, non-tender, not distended w/ normal bowel sounds.  GENITOURINARY: Bladder non tender, not distended  MUSCULOSKELETAL: No abnormal joints or musculature NEUROLOGIC: Cranial nerves 2-12 grossly intact. Moves all extremities PSYCHIATRIC: More confused than usual, no behavioral issues  Patient Active Problem List   Diagnosis Date Noted  . Real time reverse transcriptase PCR positive for COVID-19 virus 12/22/2018  . UTI (urinary tract infection) 12/22/2018  . Sacral wound 11/06/2018  . Cellulitis of sacral region 11/06/2018  . Dementia without behavioral disturbance ([place]) 07/29/2018  . Anxiety disorder, unspecified 07/23/2017  . Iron deficiency anemia 03/29/2017  . GERD (gastroesophageal reflux disease) 12/22/2016  . Decreased hearing of both ears 08/19/2016  . Pressure injury of skin 08/15/2016  . Aspiration pneumonia ([place]) 08/14/2016  . Chronic diastolic CHF (congestive heart failure) ([place]) 08/14/2016  . Chronic right shoulder pain 02/09/2016  . Bilateral lower extremity edema 02/02/2016  . Ileitis 01/19/2016  . Pressure ulcer 01/09/2016  . SBO (small bowel obstruction) ([place]) 01/08/2016  . Narcotic addiction ([place]) 05/15/2015  . Diarrhea 03/30/2015  . Acute pharyngitis 02/26/2015  . Osteoarthritis of right knee 02/22/2015  . Pain in joint, lower leg 10/31/2014  . Ulcer of sacral region, stage 4 ([place]) 04/15/2014  . Moderate aortic stenosis  04/14/2014  . Pre-syncope 04/14/2014  . Hypotension 04/14/2014  . Acute renal failure superimposed on stage 2 chronic kidney disease ([place]) 04/14/2014  . Anemia 04/14/2014  . Protein-calorie malnutrition, severe ([place]) 04/14/2014  . Altered mental status   . Dehydration 04/13/2014  . Altered mental state 04/13/2014  . Acute kidney injury ([place])   . Hypokalemia 03/25/2014  . Syncope 03/16/2014  . Acute encephalopathy 03/16/2014  . Leg swelling 03/02/2014  . Avascular necrosis of bone of  right hip ([place]) 09/27/2013  . Primary osteoarthritis of right hip 09/07/2013  . Spinal stenosis, lumbar region, with neurogenic claudication 09/07/2013  . Closed fracture of pubic ramus ([place]) 09/07/2013  . BPPV (benign paroxysmal positional vertigo) 09/07/2013  . Pharyngeal swelling 08/30/2013  . Pelvic fracture ([place]) 08/28/2013  . Fall 08/26/2013  . Dizziness 08/26/2013  . Abnormal stress test 08/26/2013  . Preop cardiovascular exam 08/01/2013  . Aortic stenosis 08/01/2013  . Edema 06/30/2013  . Loss of weight 01/04/2013  . Situational stress 01/04/2013  . Chronic right hip pain 07/05/2012  . Knee pain, acute 06/29/2012  . Vertigo 06/24/2012  . Acute exacerbation of chronic low back pain 06/24/2012  . Lumbar pain with radiation down right leg 06/09/2012  . Osteoarthritis of hip 04/29/2012  . History of TIAs 04/17/2012  . S/P total hysterectomy and bilateral salpingo-oophorectomy 04/17/2012  . Hard of hearing 04/17/2012  . History of anemia 04/17/2012  . RLS (restless legs syndrome) 04/17/2012  . Chronic back pain 04/17/2012  . Spinal stenosis, lumbar 04/17/2012  . Lower back pain 12/20/2011  . Personal history of colonic polyps 10/08/2011  . H/O 09/14/2011  . Essential hypertension 09/14/2011  . Restless legs syndrome 09/14/2011  . Constipation 08/20/2011  . Urinary incontinence 08/20/2011    CMP     Component Value Date/Time   NA 139 12/09/2018   K 3.8 12/09/2018   CL 106  08/17/2016 0343   CO2 28 08/17/2016 0343   GLUCOSE 92 08/17/2016 0343   BUN 53 (A) 12/09/2018   CREATININE 1.1 12/09/2018   CREATININE 1.13 (H) 08/17/2016 0343   CREATININE 0.78 06/28/2014 1205   CALCIUM 7.8 (L) 08/17/2016 0343   PROT 6.6 08/14/2016 1519   ALBUMIN 3.4 (L) 08/14/2016 1519   AST 14 03/01/2018   ALT 7 03/01/2018   ALKPHOS 94 03/01/2018   BILITOT 1.1 08/14/2016 1519   GFRNONAA 40 (L) 08/17/2016 0343   GFRAA 46 (L) 08/17/2016 0343   Recent Labs    03/01/18 05/31/18 12/09/18  NA 140 143 139  K 3.9 3.6 3.8  BUN 38* 38* 53*  CREATININE 1.0 1.1 1.1   Recent Labs    03/01/18  AST 14  ALT 7  ALKPHOS 94   Recent Labs    03/01/18 05/31/18 12/09/18  WBC 8.1 7.6 6.4  HGB 8.4* 8.4* 10.4*  HCT 25* 24* 32*  PLT 285 300 278   Recent Labs    03/01/18  CHOL 128  LDLCALC 65  TRIG 65   No results found for: [facility] Lab Results  Component Value Date   TSH 2.02 03/01/2018   Lab Results  Component Value Date   HGBA1C 6.3 03/01/2018   Lab Results  Component Value Date   CHOL 128 03/01/2018   HDL 50 03/01/2018   LDLCALC 65 03/01/2018   TRIG 65 03/01/2018   CHOLHDL 2.8 03/06/2014    Significant Diagnostic Results in last 30 days:  No results found.  Assessment and Plan  Moderate aortic stenosis Patient continues stable with her left ear; continue Demadex 20 mg daily  RLS (restless legs syndrome) Continues without complaints; continue Requip 4 mg nightly  Osteoarthritis of hip Patient with chronic necrosis of femoral head for years, is followed by pain clinic with a regimen of MS Contin 15 mg twice daily and oxycodone 5 mg 1 times daily; patient seems stable on this regimen    [name], MD

## 2019-01-07 ENCOUNTER — Other Ambulatory Visit: Payer: Self-pay | Admitting: Internal Medicine

## 2019-01-07 MED ORDER — MORPHINE SULFATE ER 15 MG PO TBCR: 15.0000 mg | EXTENDED_RELEASE_TABLET | Freq: Two times a day (BID) | ORAL | 0 refills | Status: DC

## 2019-01-09 ENCOUNTER — Encounter: Payer: Self-pay | Admitting: Internal Medicine

## 2019-01-09 NOTE — Assessment & Plan Note (Signed)
Patient with chronic necrosis of femoral head for years, is followed by pain clinic with a regimen of MS Contin 15 mg twice daily and oxycodone 5 mg 1 times daily; patient seems stable on this regimen

## 2019-01-09 NOTE — Assessment & Plan Note (Signed)
Continues without complaints; continue Requip 4 mg nightly

## 2019-01-09 NOTE — Assessment & Plan Note (Signed)
Patient continues stable with her left ear; continue Demadex 20 mg daily

## 2019-01-19 ENCOUNTER — Encounter: Payer: Self-pay | Admitting: Internal Medicine

## 2019-01-19 DIAGNOSIS — R627 Adult failure to thrive: Secondary | ICD-10-CM | POA: Insufficient documentation

## 2019-01-25 ENCOUNTER — Non-Acute Institutional Stay (SKILLED_NURSING_FACILITY): Payer: Medicare Other | Admitting: Internal Medicine

## 2019-01-25 ENCOUNTER — Encounter: Payer: Self-pay | Admitting: Internal Medicine

## 2019-01-25 DIAGNOSIS — R627 Adult failure to thrive: Secondary | ICD-10-CM

## 2019-01-25 DIAGNOSIS — G301 Alzheimer's disease with late onset: Secondary | ICD-10-CM | POA: Diagnosis not present

## 2019-01-25 DIAGNOSIS — Z515 Encounter for palliative care: Secondary | ICD-10-CM | POA: Diagnosis not present

## 2019-01-25 DIAGNOSIS — F0280 Dementia in other diseases classified elsewhere without behavioral disturbance: Secondary | ICD-10-CM | POA: Diagnosis not present

## 2019-01-25 NOTE — Progress Notes (Signed)
Location:  [facility] Room Number: 303-D Place of Service:  SNF (31)  Shelly Duos, MD  Patient Care Team: Shelly Duos, MD as PCP - General (Internal Medicine) [name], MD as Consulting Physician (Orthopedic Surgery) [name], MD as Consulting Physician (Otolaryngology) Mast, Man X, NP as Nurse Practitioner (Internal Medicine)  Extended Emergency Contact Information Primary Emergency Contact: [name] Address: [place], [place] [address] [name] of [place] Phone: [phone] Relation: Daughter Secondary Emergency Contact: [place], [place] of [place] Mobile Phone: [phone] Relation: Son    Allergies: Amoxicillin, Baclofen, Hctz [hydrochlorothiazide], and Valium [diazepam]  Chief Complaint  Patient presents with   Advanced Directive    End of life conference    HPI: Patient is a 85 y.o. female who is being seen for a care plan meeting which quickly turned into an end-of-life discussion.  In attendance were Marjory Lies MDS nurse, dietitian, social worker, and myself.  Patient's power of attorney is her daughter who lives in [place] and she has a son who lives in [place].  Past Medical History:  Diagnosis Date   Abnormal stress test 08/26/2013   Acute encephalopathy    AKI (acute kidney injury) ([place])    Altered mental state    Anemia    Anxiety disorder, unspecified    Aortic stenosis    Arthritis    Back, knees   Avascular necrosis of bone of right hip ([place]) 09/27/2013   Bowel incontinence    BPPV (benign paroxysmal positional vertigo) 09/07/2013   Closed fracture of pubic ramus ([place]) 09/07/2013   Colon polyps    Constipation    Decreased hearing of both ears 08/19/2016   Last Assessment & Plan:  Concern over hearing loss. Slowly progressive hearing loss.  No history of ear surgery, trauma or infection. EXAMINATION  shows normal external canals and tympanic membranes. AUDIOGRAM severe sensorineural hearing loss.  Symmetric in nature. PLAN: Reassured I do not see anything bad going on.  She is a candidate for hearing aids.   will check her benefits.   Gastric ulcer    years ago   History of anemia 04/17/2012   History of TIAs 04/17/2012   June 2011    Hypertension    Hypokalemia    Pelvic fracture ([place]) 08/28/2013   Personal history of colonic polyps 10/08/2011   Index polypectomy 2013    Restless legs syndrome    on Requip   S/P total hysterectomy and bilateral salpingo-oophorectomy 04/17/2012   Situational stress    Spinal stenosis    Spinal stenosis, lumbar 04/17/2012   Spinal stenosis, lumbar region, with neurogenic claudication 09/07/2013   Stroke ([place])    Mini, no residual   TIA (transient ischemic attack)    Ulcer causing bleeding and hole in wall of stomach or small intestine    Urinary bladder calculus    Urinary incontinence    Vertigo     Past Surgical History:  Procedure Laterality Date   COLON RESECTION  50 years ago   LUMBAR LAMINECTOMY/DECOMPRESSION MICRODISCECTOMY  06/25/2011   Procedure: LUMBAR LAMINECTOMY/DECOMPRESSION MICRODISCECTOMY;  Surgeon: Hosie Spangle, MD;  Location: [facility];  Service: Neurosurgery;  Laterality: Right;  RIGHT Lumbar Two-Three hemilaminectomy and microdiskectomy   TONSILLECTOMY     TOTAL ABDOMINAL HYSTERECTOMY W/ BILATERAL SALPINGOOPHORECTOMY  04/17/2012   UTERINE  FIBROID SURGERY      Allergies as of 01/25/2019      Reactions   Amoxicillin Other (See Comments)   Reaction unknown   Baclofen Other (See Comments)   "fuzzy in the eyes" and dizzy   Hctz [hydrochlorothiazide]    nausea   Valium [diazepam] Other (See Comments)   Pt became unresponsive and O2 sats dropped.      Medication List       Accurate as of January 25, 2019  2:44 PM. If you have any questions, ask your nurse or doctor.        STOP taking  these medications   Ensure Clear Liqd Stopped by: Inocencio Homes, MD     TAKE these medications   acetaminophen 325 MG tablet Commonly known as: TYLENOL Take 325 mg by mouth. 2 tabs p.o. every 6 hours PRN. Notify MD if not relieved. Not to exceed 3000mg  in 24 hour period   ammonium lactate 12 % lotion Commonly known as: LAC-HYDRIN Apply 1 application topically daily. Apply to bilateral feet   aspirin 81 MG chewable tablet Chew 81 mg by mouth daily.   calcium carbonate 500 MG chewable tablet Commonly known as: TUMS - dosed in mg elemental calcium Chew 1 tablet by mouth 3 (three) times daily with meals.   feeding supplement (PRO-STAT SUGAR FREE 64) Liqd Take 30 mLs by mouth 2 (two) times a day.   feeding supplement Liqd Commonly known as: BOOST / RESOURCE BREEZE Take 1 Container by mouth 3 (three) times daily.   ferrous sulfate 325 (65 FE) MG tablet Take 325 mg by mouth 2 (two) times daily.   lidocaine 5 % Commonly known as: LIDODERM Place 1 patch onto the skin. APPLY 1 PATCH TO RIGHT SHOULDER EVERY 12 HOURS FOR CHRONIC PAIN (ON FOR 12 HOURS AND OFF 12 HOURS)   lubiprostone 24 MCG capsule Commonly known as: AMITIZA Take 24 mcg by mouth 2 (two) times daily with a meal.   Melatonin 3 MG Tabs Take 1 tablet by mouth at bedtime.   menthol-cetylpyridinium 3 MG lozenge Commonly known as: CEPACOL Take 1 lozenge by mouth every 2 (two) hours as needed for sore throat.   mirtazapine 15 MG tablet Commonly known as: REMERON Take 15 mg by mouth at bedtime. What changed: Another medication with the same name was removed. Continue taking this medication, and follow the directions you see here. Changed by: Inocencio Homes, MD   morphine 15 MG 12 hr tablet Commonly known as: MS CONTIN Take 1 tablet (15 mg total) by mouth every 12 (twelve) hours.   multivitamin tablet Take 1 tablet by mouth daily.   Myrbetriq 50 MG Tb24 tablet Generic drug: mirabegron ER Take 50 mg by mouth  daily. bladder   ondansetron 4 MG tablet Commonly known as: ZOFRAN Take 4 mg by mouth every 12 (twelve) hours as needed for nausea or vomiting.   oxyCODONE 5 MG immediate release tablet Commonly known as: Oxy IR/ROXICODONE Take 1 tablet (5 mg total) by mouth daily. At 12 pm   potassium chloride 10 MEQ tablet Commonly known as: K-DUR Take 20 mEq by mouth daily. 2 tablets   Protonix 40 MG tablet Generic drug: pantoprazole Take 40 mg by mouth daily.   rOPINIRole 4 MG tablet Commonly known as: REQUIP Take 4 mg by mouth at bedtime.   Systane Balance 0.6 % Soln Generic drug: Propylene Glycol Place 1 drop into both eyes 2 (two) times daily.   torsemide 20 MG tablet  Commonly known as: DEMADEX Take 20 mg by mouth daily.   Vitamin D3 1.25 MG (50000 UT) Caps Take 1 capsule by mouth once a week.       No orders of the defined types were placed in this encounter.   Immunization History  Administered Date(s) Administered   Influenza Split 04/15/2012   Influenza,inj,Quad PF,6+ Mos 03/06/2014   Influenza-Unspecified 03/01/2016, 05/01/2017, 03/12/2018   PPD Test 08/30/2014, 09/18/2014   Pneumococcal Conjugate-13 07/26/2017   Pneumococcal Polysaccharide-23 08/29/2013    Social History   Tobacco Use   Smoking status: Never Smoker   Smokeless tobacco: Never Used  Substance Use Topics   Alcohol use: Yes    Alcohol/week: 0.0 standard drinks    Comment: rarely    Review of Systems   unable to obtain meaningful; nursing- concern for decreased appetite    Vitals:   01/25/19 1432  BP: 101/66  Pulse: 84  Resp: 18  Temp: (!) 97.3 F (36.3 C)   Body mass index is 14.3 kg/m. Physical Exam  GENERAL APPEARANCE: Alert, conversant, No acute distress  SKIN: No diaphoresis rash HEENT: Unremarkable RESPIRATORY: Breathing is even, unlabored. Lung sounds are clear   CARDIOVASCULAR: Heart [id number] 3/6 systolic murmur , no no rubs or gallops. No peripheral edema   GASTROINTESTINAL: Abdomen is soft, non-tender, not distended w/ normal bowel sounds.  GENITOURINARY: Bladder non tender, not distended  MUSCULOSKELETAL: No abnormal joints or musculature NEUROLOGIC: Cranial nerves 2-12 grossly intact. Moves all extremities PSYCHIATRIC: Happily demented, no behavioral issues  Patient Active Problem List   Diagnosis Date Noted   Failure to thrive in adult 01/19/2019   Real time reverse transcriptase PCR positive for COVID-19 virus 12/22/2018   UTI (urinary tract infection) 12/22/2018   Sacral wound 11/06/2018   Cellulitis of sacral region 11/06/2018   Dementia without behavioral disturbance ([facility]) 07/29/2018   Anxiety disorder, unspecified 07/23/2017   Iron deficiency anemia 03/29/2017   GERD (gastroesophageal reflux disease) 12/22/2016   Decreased hearing of both ears 08/19/2016   Pressure injury of skin 08/15/2016   Aspiration pneumonia ([facility]) 08/14/2016   Chronic diastolic CHF (congestive heart failure) ([place]) 08/14/2016   Chronic right shoulder pain 02/09/2016   Bilateral lower extremity edema 02/02/2016   Ileitis 01/19/2016   Pressure ulcer 01/09/2016   SBO (small bowel obstruction) ([place]) 01/08/2016   Narcotic addiction ([place]) 05/15/2015   Diarrhea 03/30/2015   Acute pharyngitis 02/26/2015   Osteoarthritis of right knee 02/22/2015   Pain in joint, lower leg 10/31/2014   Ulcer of sacral region, stage 4 ([place]) 04/15/2014   Moderate aortic stenosis 04/14/2014   Pre-syncope 04/14/2014   Hypotension 04/14/2014   Acute renal failure superimposed on stage 2 chronic kidney disease ([place]) 04/14/2014   Anemia 04/14/2014   Protein-calorie malnutrition, severe ([place]) 04/14/2014   Altered mental status    Dehydration 04/13/2014   Altered mental state 04/13/2014   Acute kidney injury ([place])    Hypokalemia 03/25/2014   Syncope 03/16/2014   Acute encephalopathy 03/16/2014   Leg swelling 03/02/2014   Avascular necrosis  of bone of right hip ([place]) 09/27/2013   Primary osteoarthritis of right hip 09/07/2013   Spinal stenosis, lumbar region, with neurogenic claudication 09/07/2013   Closed fracture of pubic ramus ([place]) 09/07/2013   BPPV (benign paroxysmal positional vertigo) 09/07/2013   Pharyngeal swelling 08/30/2013   Pelvic fracture ([place]) 08/28/2013   Fall 08/26/2013   Dizziness 08/26/2013   Abnormal stress test 08/26/2013   Preop cardiovascular exam 08/01/2013   Aortic  stenosis 08/01/2013   Edema 06/30/2013   Loss of weight 01/04/2013   Situational stress 01/04/2013   Chronic right hip pain 07/05/2012   Knee pain, acute 06/29/2012   Vertigo 06/24/2012   Acute exacerbation of chronic low back pain 06/24/2012   Lumbar pain with radiation down right leg 06/09/2012   Osteoarthritis of hip 04/29/2012   History of TIAs 04/17/2012   S/P total hysterectomy and bilateral salpingo-oophorectomy 04/17/2012   Hard of hearing 04/17/2012   History of anemia 04/17/2012   RLS (restless legs syndrome) 04/17/2012   Chronic back pain 04/17/2012   Spinal stenosis, lumbar 04/17/2012   Lower back pain 12/20/2011   Personal history of colonic polyps 10/08/2011   H/O 09/14/2011   Essential hypertension 09/14/2011   Restless legs syndrome 09/14/2011   Constipation 08/20/2011   Urinary incontinence 08/20/2011    CMP     Component Value Date/Time   NA 139 12/09/2018   K 3.8 12/09/2018   CL 106 08/17/2016 0343   CO2 28 08/17/2016 0343   GLUCOSE 92 08/17/2016 0343   BUN 53 (A) 12/09/2018   CREATININE 1.1 12/09/2018   CREATININE 1.13 (H) 08/17/2016 0343   CREATININE 0.78 06/28/2014 1205   CALCIUM 7.8 (L) 08/17/2016 0343   PROT 6.6 08/14/2016 1519   ALBUMIN 3.4 (L) 08/14/2016 1519   AST 14 03/01/2018   ALT 7 03/01/2018   ALKPHOS 94 03/01/2018   BILITOT 1.1 08/14/2016 1519   GFRNONAA 40 (L) 08/17/2016 0343   GFRAA 46 (L) 08/17/2016 0343   Recent Labs    03/01/18 05/31/18  12/09/18  NA 140 143 139  K 3.9 3.6 3.8  BUN 38* 38* 53*  CREATININE 1.0 1.1 1.1   Recent Labs    03/01/18  AST 14  ALT 7  ALKPHOS 94   Recent Labs    03/01/18 05/31/18 12/09/18  WBC 8.1 7.6 6.4  HGB 8.4* 8.4* 10.4*  HCT 25* 24* 32*  PLT 285 300 278   Recent Labs    03/01/18  CHOL 128  LDLCALC 65  TRIG 65   No results found for: [facility] Lab Results  Component Value Date   TSH 2.02 03/01/2018   Lab Results  Component Value Date   HGBA1C 6.3 03/01/2018   Lab Results  Component Value Date   CHOL 128 03/01/2018   HDL 50 03/01/2018   LDLCALC 65 03/01/2018   TRIG 65 03/01/2018   CHOLHDL 2.8 03/06/2014    Significant Diagnostic Results in last 30 days:  No results found.  Assessment and Plan  End-of-life discussion/progressive dementiaFailure to thrive failure to thrive- patient's family has not seen her in a while since before COVID even and daughter was surprised in distress to know that her mother's dementia had progressed as far as it had.  She was distressed the patient's been losing weight because her appetite has been decreased.  I explained to patient's daughter that this is the normal progression for dementia letter mother was not unhappy in fact she was very pleasantly demented and that when people with her type of dementia do not eat its they are not hungry so they are not starving just do not want to eat after.  After the initial shock patient daughter who is POA said to make patient comfort care only and to make sure patient is comfortable.  Patient is not at this stage yet where family needs to come see her but SNF will let family know when it may be time  for them to come to say goodbye.  It was a difficult discussion.    End-of-life discussion lasted greater than 30 minutes Shelly Duos, MD

## 2019-01-31 ENCOUNTER — Encounter: Payer: Self-pay | Admitting: Internal Medicine

## 2019-02-21 ENCOUNTER — Encounter: Payer: Self-pay | Admitting: Internal Medicine

## 2019-02-21 ENCOUNTER — Non-Acute Institutional Stay (SKILLED_NURSING_FACILITY): Payer: Medicare Other | Admitting: Internal Medicine

## 2019-02-21 DIAGNOSIS — F0280 Dementia in other diseases classified elsewhere without behavioral disturbance: Secondary | ICD-10-CM | POA: Diagnosis not present

## 2019-02-21 DIAGNOSIS — G301 Alzheimer's disease with late onset: Secondary | ICD-10-CM | POA: Diagnosis not present

## 2019-02-21 DIAGNOSIS — I10 Essential (primary) hypertension: Secondary | ICD-10-CM | POA: Diagnosis not present

## 2019-02-21 DIAGNOSIS — K2100 Gastro-esophageal reflux disease with esophagitis, without bleeding: Secondary | ICD-10-CM

## 2019-02-21 NOTE — Progress Notes (Signed)
Location:  [facility] Room Number: 303-D Place of Service:  SNF (31)  [name], MD  Patient Care Team: [name], MD as PCP - General (Internal Medicine) [name], MD as Consulting Physician (Orthopedic Surgery) [name], MD as Consulting Physician (Otolaryngology) Mast, Man X, NP as Nurse Practitioner (Internal Medicine)  Extended Emergency Contact Information Primary Emergency Contact: [name] Address: [place], [place] [address] [place] Phone: [phone] Relation: Daughter Secondary Emergency Contact: [place], [place] Mobile Phone: [phone] Relation: Son    Allergies: Amoxicillin, Baclofen, Hctz [hydrochlorothiazide], and Valium [diazepam]  Chief Complaint  Patient presents with  . Medical Management of Chronic Issues    Routine [facility] visit    HPI: Patient is a 85 y.o. female who is being seen for routine issues of hypertension, GERD, and dementia.  Past Medical History:  Diagnosis Date  . Abnormal stress test 08/26/2013  . Acute encephalopathy   . AKI (acute kidney injury) ([place])   . Altered mental state   . Anemia   . Anxiety disorder, unspecified   . Aortic stenosis   . Arthritis    Back, knees  . Avascular necrosis of bone of right hip ([place]) 09/27/2013  . Bowel incontinence   . BPPV (benign paroxysmal positional vertigo) 09/07/2013  . Closed fracture of pubic ramus ([place]) 09/07/2013  . Colon polyps   . Constipation   . Decreased hearing of both ears 08/19/2016   Last Assessment & Plan:  Concern over hearing loss. Slowly progressive hearing loss.  No history of ear surgery, trauma or infection. EXAMINATION shows normal external canals and tympanic membranes. AUDIOGRAM severe sensorineural hearing loss.  Symmetric in nature. PLAN: Reassured I do not see anything bad going on.  She is a  candidate for hearing aids.   will check her benefits.  . Gastric ulcer    years ago  . History of anemia 04/17/2012  . History of TIAs 04/17/2012   June 2011   . Hypertension   . Hypokalemia   . Pelvic fracture ([place]) 08/28/2013  . Personal history of colonic polyps 10/08/2011   Index polypectomy 2013   . Restless legs syndrome    on Requip  . S/P total hysterectomy and bilateral salpingo-oophorectomy 04/17/2012  . Situational stress   . Spinal stenosis   . Spinal stenosis, lumbar 04/17/2012  . Spinal stenosis, lumbar region, with neurogenic claudication 09/07/2013  . Stroke ([place])    Mini, no residual  . TIA (transient ischemic attack)   . Ulcer causing bleeding and hole in wall of stomach or small intestine   . Urinary bladder calculus   . Urinary incontinence   . Vertigo     Past Surgical History:  Procedure Laterality Date  . COLON RESECTION  50 years ago  . LUMBAR LAMINECTOMY/DECOMPRESSION MICRODISCECTOMY  06/25/2011   Procedure: LUMBAR LAMINECTOMY/DECOMPRESSION MICRODISCECTOMY;  Surgeon: Hosie Spangle, MD;  Location: [facility];  Service: Neurosurgery;  Laterality: Right;  RIGHT Lumbar Two-Three hemilaminectomy and microdiskectomy  . TONSILLECTOMY    . TOTAL ABDOMINAL HYSTERECTOMY W/ BILATERAL SALPINGOOPHORECTOMY  04/17/2012  . UTERINE FIBROID SURGERY      Allergies as of 02/21/2019      Reactions   Amoxicillin Other (See Comments)   Reaction unknown   Baclofen Other (See Comments)   "  fuzzy in the eyes" and dizzy   Hctz [hydrochlorothiazide]    nausea   Valium [diazepam] Other (See Comments)   Pt became unresponsive and O2 sats dropped.      Medication List       Accurate as of February 21, 2019 11:59 PM. If you have any questions, ask your nurse or doctor.        acetaminophen 325 MG tablet Commonly known as: TYLENOL Take 325 mg by mouth. 2 tabs p.o. every 6 hours PRN. Notify MD if not relieved. Not to exceed 3000mg  in 24 hour period   ammonium  lactate 12 % lotion Commonly known as: LAC-HYDRIN Apply 1 application topically daily. Apply to bilateral feet   aspirin 81 MG chewable tablet Chew 81 mg by mouth daily.   calcium carbonate 500 MG chewable tablet Commonly known as: TUMS - dosed in mg elemental calcium Chew 1 tablet by mouth 3 (three) times daily with meals.   feeding supplement (PRO-STAT SUGAR FREE 64) Liqd Take 30 mLs by mouth 2 (two) times a day.   feeding supplement Liqd Commonly known as: BOOST / RESOURCE BREEZE Take 1 Container by mouth 3 (three) times daily.   ferrous sulfate 325 (65 FE) MG tablet Take 325 mg by mouth 2 (two) times daily.   JUVEN PO Take by mouth 2 (two) times daily.   lidocaine 5 % Commonly known as: LIDODERM Place 1 patch onto the skin. APPLY 1 PATCH TO RIGHT SHOULDER EVERY 12 HOURS FOR CHRONIC PAIN (ON FOR 12 HOURS AND OFF 12 HOURS)   lubiprostone 24 MCG capsule Commonly known as: AMITIZA Take 24 mcg by mouth 2 (two) times daily with a meal.   Melatonin 3 MG Tabs Take 1 tablet by mouth at bedtime.   menthol-cetylpyridinium 3 MG lozenge Commonly known as: CEPACOL Take 1 lozenge by mouth every 2 (two) hours as needed for sore throat.   mirtazapine 15 MG tablet Commonly known as: REMERON Take 15 mg by mouth at bedtime.   morphine 15 MG 12 hr tablet Commonly known as: MS CONTIN Take 1 tablet (15 mg total) by mouth every 12 (twelve) hours.   multivitamin tablet Take 1 tablet by mouth daily.   Myrbetriq 50 MG Tb24 tablet Generic drug: mirabegron ER Take 50 mg by mouth daily. bladder   ondansetron 4 MG tablet Commonly known as: ZOFRAN Take 4 mg by mouth every 12 (twelve) hours as needed for nausea or vomiting.   oxyCODONE 5 MG immediate release tablet Commonly known as: Oxy IR/ROXICODONE Take 1 tablet (5 mg total) by mouth daily. At 12 pm   potassium chloride 10 MEQ tablet Commonly known as: KLOR-CON Take 20 mEq by mouth daily. 2 tablets   Protonix 40 MG tablet  Generic drug: pantoprazole Take 40 mg by mouth daily.   rOPINIRole 4 MG tablet Commonly known as: REQUIP Take 4 mg by mouth at bedtime.   Systane Balance 0.6 % Soln Generic drug: Propylene Glycol Place 1 drop into both eyes 2 (two) times daily.   torsemide 20 MG tablet Commonly known as: DEMADEX Take 20 mg by mouth daily.   Vitamin D3 1.25 MG (50000 UT) Caps Take 1 capsule by mouth once a week.       No orders of the defined types were placed in this encounter.   Immunization History  Administered Date(s) Administered  . Influenza Split 04/15/2012  . Influenza,inj,Quad PF,6+ Mos 03/06/2014  . Influenza-Unspecified 03/01/2016, 05/01/2017, 03/12/2018  . PPD Test 08/30/2014,  09/18/2014  . Pneumococcal Conjugate-13 07/26/2017  . Pneumococcal Polysaccharide-23 08/29/2013    Social History   Tobacco Use  . Smoking status: Never Smoker  . Smokeless tobacco: Never Used  Substance Use Topics  . Alcohol use: Yes    Alcohol/week: 0.0 standard drinks    Comment: rarely    Review of Systems  DATA OBTAINED: from nurse GENERAL:  no fevers, fatigue, appetite changes SKIN: No itching, rash HEENT: No complaint RESPIRATORY: No cough, wheezing, SOB CARDIAC: No chest pain, palpitations, lower extremity edema  GI: No abdominal pain, No N/V/D or constipation, No heartburn or reflux  GU: No dysuria, frequency or urgency, or incontinence  MUSCULOSKELETAL: No unrelieved bone/joint pain NEUROLOGIC: No headache, dizziness  PSYCHIATRIC: No overt anxiety or sadness  Vitals:   02/21/19 1248  BP: (!) 115/53  Pulse: 84  Resp: 18  Temp: 98.6 F (37 C)  SpO2: 94%   Body mass index is 14.9 kg/m. Physical Exam  GENERAL APPEARANCE: Alert, conversant, No acute distress  SKIN: No diaphoresis rash HEENT: Unremarkable RESPIRATORY: Breathing is even, unlabored. Lung sounds are clear   CARDIOVASCULAR: Heart RRR 4/6 systolic murmur, no rubs or gallops. No peripheral edema   GASTROINTESTINAL: Abdomen is soft, non-tender, not distended w/ normal bowel sounds.  GENITOURINARY: Bladder non tender, not distended  MUSCULOSKELETAL: No abnormal joints or musculature NEUROLOGIC: Cranial nerves 2-12 grossly intact. Moves all extremities PSYCHIATRIC: Pleasantly demented, no behavioral issues  Patient Active Problem List   Diagnosis Date Noted  . Failure to thrive in adult 01/19/2019  . Real time reverse transcriptase PCR positive for COVID-19 virus 12/22/2018  . UTI (urinary tract infection) 12/22/2018  . Sacral wound 11/06/2018  . Cellulitis of sacral region 11/06/2018  . Dementia without behavioral disturbance ([place]) 07/29/2018  . Anxiety disorder, unspecified 07/23/2017  . Iron deficiency anemia 03/29/2017  . GERD (gastroesophageal reflux disease) 12/22/2016  . Decreased hearing of both ears 08/19/2016  . Pressure injury of skin 08/15/2016  . Aspiration pneumonia ([place]) 08/14/2016  . Chronic diastolic CHF (congestive heart failure) ([place]) 08/14/2016  . Chronic right shoulder pain 02/09/2016  . Bilateral lower extremity edema 02/02/2016  . Ileitis 01/19/2016  . Pressure ulcer 01/09/2016  . SBO (small bowel obstruction) ([place]) 01/08/2016  . Narcotic addiction ([place]) 05/15/2015  . Diarrhea 03/30/2015  . Acute pharyngitis 02/26/2015  . Osteoarthritis of right knee 02/22/2015  . Pain in joint, lower leg 10/31/2014  . Ulcer of sacral region, stage 4 ([place]) 04/15/2014  . Moderate aortic stenosis 04/14/2014  . Pre-syncope 04/14/2014  . Hypotension 04/14/2014  . Acute renal failure superimposed on stage 2 chronic kidney disease ([place]) 04/14/2014  . Anemia 04/14/2014  . Protein-calorie malnutrition, severe ([place]) 04/14/2014  . Altered mental status   . Dehydration 04/13/2014  . Altered mental state 04/13/2014  . Acute kidney injury ([place])   . Hypokalemia 03/25/2014  . Syncope 03/16/2014  . Acute encephalopathy 03/16/2014  . Leg swelling 03/02/2014  . Avascular  necrosis of bone of right hip ([place]) 09/27/2013  . Primary osteoarthritis of right hip 09/07/2013  . Spinal stenosis, lumbar region, with neurogenic claudication 09/07/2013  . Closed fracture of pubic ramus ([place]) 09/07/2013  . BPPV (benign paroxysmal positional vertigo) 09/07/2013  . Pharyngeal swelling 08/30/2013  . Pelvic fracture ([place]) 08/28/2013  . Fall 08/26/2013  . Dizziness 08/26/2013  . Abnormal stress test 08/26/2013  . Preop cardiovascular exam 08/01/2013  . Aortic stenosis 08/01/2013  . Edema 06/30/2013  . Loss of weight 01/04/2013  . Situational  stress 01/04/2013  . Chronic right hip pain 07/05/2012  . Knee pain, acute 06/29/2012  . Vertigo 06/24/2012  . Acute exacerbation of chronic low back pain 06/24/2012  . Lumbar pain with radiation down right leg 06/09/2012  . Osteoarthritis of hip 04/29/2012  . History of TIAs 04/17/2012  . S/P total hysterectomy and bilateral salpingo-oophorectomy 04/17/2012  . Hard of hearing 04/17/2012  . History of anemia 04/17/2012  . RLS (restless legs syndrome) 04/17/2012  . Chronic back pain 04/17/2012  . Spinal stenosis, lumbar 04/17/2012  . Lower back pain 12/20/2011  . Personal history of colonic polyps 10/08/2011  . H/O 09/14/2011  . Essential hypertension 09/14/2011  . Restless legs syndrome 09/14/2011  . Constipation 08/20/2011  . Urinary incontinence 08/20/2011    CMP     Component Value Date/Time   NA 139 12/09/2018   K 3.8 12/09/2018   CL 106 08/17/2016 0343   CO2 28 08/17/2016 0343   GLUCOSE 92 08/17/2016 0343   BUN 53 (A) 12/09/2018   CREATININE 1.1 12/09/2018   CREATININE 1.13 (H) 08/17/2016 0343   CREATININE 0.78 06/28/2014 1205   CALCIUM 7.8 (L) 08/17/2016 0343   PROT 6.6 08/14/2016 1519   ALBUMIN 3.4 (L) 08/14/2016 1519   AST 14 03/01/2018   ALT 7 03/01/2018   ALKPHOS 94 03/01/2018   BILITOT 1.1 08/14/2016 1519   GFRNONAA 40 (L) 08/17/2016 0343   GFRAA 46 (L) 08/17/2016 0343   Recent Labs    03/01/18  05/31/18 12/09/18  NA 140 143 139  K 3.9 3.6 3.8  BUN 38* 38* 53*  CREATININE 1.0 1.1 1.1   Recent Labs    03/01/18  AST 14  ALT 7  ALKPHOS 94   Recent Labs    03/01/18 05/31/18 12/09/18  WBC 8.1 7.6 6.4  HGB 8.4* 8.4* 10.4*  HCT 25* 24* 32*  PLT 285 300 278   Recent Labs    03/01/18  CHOL 128  LDLCALC 65  TRIG 65   No results found for: [facility] Lab Results  Component Value Date   TSH 2.02 03/01/2018   Lab Results  Component Value Date   HGBA1C 6.3 03/01/2018   Lab Results  Component Value Date   CHOL 128 03/01/2018   HDL 50 03/01/2018   LDLCALC 65 03/01/2018   TRIG 65 03/01/2018   CHOLHDL 2.8 03/06/2014    Significant Diagnostic Results in last 30 days:  No results found.  Assessment and Plan  Essential hypertension Controlled on diuretics; continue 40 mg Tuesday Thursday Saturday Sunday of Demadex and Demadex 60 mg on Monday Wednesday Friday  GERD (gastroesophageal reflux disease) No reports of reflux or aspiration; continue Protonix 40 mg daily  Dementia without behavioral disturbance ([place]) Chronic and progressive, although patient is very content; continue supportive care    [name], MD

## 2019-02-22 ENCOUNTER — Encounter: Payer: Self-pay | Admitting: Internal Medicine

## 2019-02-22 NOTE — Assessment & Plan Note (Signed)
Chronic and progressive, although patient is very content; continue supportive care

## 2019-02-22 NOTE — Assessment & Plan Note (Signed)
Controlled on diuretics; continue 40 mg Tuesday Thursday Saturday Sunday of Demadex and Demadex 60 mg on Monday Wednesday Friday

## 2019-02-22 NOTE — Assessment & Plan Note (Signed)
No reports of reflux or aspiration; continue Protonix 40 mg daily 

## 2019-03-11 ENCOUNTER — Other Ambulatory Visit: Payer: Self-pay | Admitting: Internal Medicine

## 2019-03-11 MED ORDER — OXYCODONE HCL 5 MG PO TABS: 5.0000 mg | ORAL_TABLET | Freq: Every day | ORAL | 0 refills | Status: DC

## 2019-03-17 ENCOUNTER — Non-Acute Institutional Stay (SKILLED_NURSING_FACILITY): Payer: Medicare Other | Admitting: Internal Medicine

## 2019-03-17 DIAGNOSIS — M87051 Idiopathic aseptic necrosis of right femur: Secondary | ICD-10-CM

## 2019-03-17 DIAGNOSIS — L89319 Pressure ulcer of right buttock, unspecified stage: Secondary | ICD-10-CM

## 2019-03-17 DIAGNOSIS — R627 Adult failure to thrive: Secondary | ICD-10-CM | POA: Diagnosis not present

## 2019-03-17 NOTE — Progress Notes (Signed)
Location:  [facility] Room Number: 303-D Place of Service:  SNF (31)  [name], MD  Patient Care Team: [name], MD as PCP - General (Internal Medicine) [name], MD as Consulting Physician (Orthopedic Surgery) [name], MD as Consulting Physician (Otolaryngology) Mast, Man X, NP as Nurse Practitioner (Internal Medicine)  Extended Emergency Contact Information Primary Emergency Contact: [name] Address: [place], [place] [address] [place] Phone: [phone] Relation: Daughter Secondary Emergency Contact: [place], [place] Mobile Phone: [phone] Relation: Son    Allergies: Amoxicillin, Baclofen, Hctz [hydrochlorothiazide], and Valium [diazepam]  Chief Complaint  Patient presents with  . Medical Management of Chronic Issues    Routine [facility] visit    HPI: Patient is a 85 y.o. female who is being seen for routine issues of avascular necrosis of right hip, pressure ulcers, and failure to thrive.  Past Medical History:  Diagnosis Date  . Abnormal stress test 08/26/2013  . Acute encephalopathy   . AKI (acute kidney injury) ([place])   . Altered mental state   . Anemia   . Anxiety disorder, unspecified   . Aortic stenosis   . Arthritis    Back, knees  . Avascular necrosis of bone of right hip ([place]) 09/27/2013  . Bowel incontinence   . BPPV (benign paroxysmal positional vertigo) 09/07/2013  . Closed fracture of pubic ramus ([place]) 09/07/2013  . Colon polyps   . Constipation   . Decreased hearing of both ears 08/19/2016   Last Assessment & Plan:  Concern over hearing loss. Slowly progressive hearing loss.  No history of ear surgery, trauma or infection. EXAMINATION shows normal external canals and tympanic membranes. AUDIOGRAM severe sensorineural hearing loss.  Symmetric in nature. PLAN: Reassured I do not see  anything bad going on.  She is a candidate for hearing aids.   will check her benefits.  . Gastric ulcer    years ago  . History of anemia 04/17/2012  . History of TIAs 04/17/2012   June 2011   . Hypertension   . Hypokalemia   . Pelvic fracture ([place]) 08/28/2013  . Personal history of colonic polyps 10/08/2011   Index polypectomy 2013   . Restless legs syndrome    on [place]  . S/P total hysterectomy and bilateral salpingo-oophorectomy 04/17/2012  . Situational stress   . Spinal stenosis   . Spinal stenosis, lumbar 04/17/2012  . Spinal stenosis, lumbar region, with neurogenic claudication 09/07/2013  . Stroke ([place])    Mini, no residual  . TIA (transient ischemic attack)   . Ulcer causing bleeding and hole in wall of stomach or small intestine   . Urinary bladder calculus   . Urinary incontinence   . Vertigo     Past Surgical History:  Procedure Laterality Date  . COLON RESECTION  50 years ago  . LUMBAR LAMINECTOMY/DECOMPRESSION MICRODISCECTOMY  06/25/2011   Procedure: LUMBAR LAMINECTOMY/DECOMPRESSION MICRODISCECTOMY;  Surgeon: Hosie Spangle, MD;  Location: [facility];  Service: Neurosurgery;  Laterality: Right;  RIGHT Lumbar Two-Three hemilaminectomy and microdiskectomy  . TONSILLECTOMY    . TOTAL ABDOMINAL HYSTERECTOMY W/ BILATERAL SALPINGOOPHORECTOMY  04/17/2012  . UTERINE FIBROID SURGERY      Allergies as of 03/17/2019      Reactions   Amoxicillin Other (See Comments)   Reaction  unknown   Baclofen Other (See Comments)   "fuzzy in the eyes" and dizzy   Hctz [hydrochlorothiazide]    nausea   Valium [diazepam] Other (See Comments)   Pt became unresponsive and O2 sats dropped.      Medication List       Accurate as of March 17, 2019 11:59 PM. If you have any questions, ask your nurse or doctor.        acetaminophen 325 MG tablet Commonly known as: TYLENOL Take 325 mg by mouth. 2 tabs p.o. every 6 hours PRN. Notify MD if not relieved. Not to exceed 3000mg  in  24 hour period   ammonium lactate 12 % lotion Commonly known as: LAC-HYDRIN Apply 1 application topically daily. Apply to bilateral feet   aspirin 81 MG chewable tablet Chew 81 mg by mouth daily.   calcium carbonate 500 MG chewable tablet Commonly known as: TUMS - dosed in mg elemental calcium Chew 1 tablet by mouth 3 (three) times daily with meals.   feeding supplement (PRO-STAT SUGAR FREE 64) Liqd Take 30 mLs by mouth 2 (two) times a day.   feeding supplement Liqd Commonly known as: BOOST / RESOURCE BREEZE Take 1 Container by mouth 3 (three) times daily.   ferrous sulfate 325 (65 FE) MG tablet Take 325 mg by mouth 2 (two) times daily.   JUVEN PO Take by mouth 2 (two) times daily.   lidocaine 5 % Commonly known as: LIDODERM Place 1 patch onto the skin. APPLY 1 PATCH TO RIGHT SHOULDER EVERY 12 HOURS FOR CHRONIC PAIN (ON FOR 12 HOURS AND OFF 12 HOURS)   lubiprostone 24 MCG capsule Commonly known as: AMITIZA Take 24 mcg by mouth 2 (two) times daily with a meal.   Melatonin 3 MG Tabs Take 1 tablet by mouth at bedtime.   menthol-cetylpyridinium 3 MG lozenge Commonly known as: CEPACOL Take 1 lozenge by mouth every 2 (two) hours as needed for sore throat.   mirtazapine 15 MG tablet Commonly known as: REMERON Take 15 mg by mouth at bedtime.   morphine 15 MG 12 hr tablet Commonly known as: MS CONTIN Take 1 tablet (15 mg total) by mouth every 12 (twelve) hours.   multivitamin tablet Take 1 tablet by mouth daily.   Myrbetriq 50 MG Tb24 tablet Generic drug: mirabegron ER Take 50 mg by mouth daily. bladder   ondansetron 4 MG tablet Commonly known as: ZOFRAN Take 4 mg by mouth every 12 (twelve) hours as needed for nausea or vomiting.   oxyCODONE 5 MG immediate release tablet Commonly known as: Oxy IR/ROXICODONE Take 1 tablet (5 mg total) by mouth daily. At 12 pm   potassium chloride 10 MEQ tablet Commonly known as: KLOR-CON Take 20 mEq by mouth daily. 2 tablets    Protonix 40 MG tablet Generic drug: pantoprazole Take 40 mg by mouth daily.   rOPINIRole 4 MG tablet Commonly known as: REQUIP Take 4 mg by mouth at bedtime.   Systane Balance 0.6 % Soln Generic drug: Propylene Glycol Place 1 drop into both eyes 2 (two) times daily.   torsemide 20 MG tablet Commonly known as: DEMADEX Take 20 mg by mouth daily.   Vitamin D3 1.25 MG (50000 UT) Caps Take 1 capsule by mouth once a week.       No orders of the defined types were placed in this encounter.   Immunization History  Administered Date(s) Administered  . Influenza Split 04/15/2012  . Influenza,inj,Quad PF,6+ Mos 03/06/2014  .  Influenza-Unspecified 05/01/2017, 03/12/2018, 02/24/2019  . PPD Test 08/30/2014, 09/18/2014  . Pneumococcal Conjugate-13 07/26/2017  . Pneumococcal Polysaccharide-23 08/29/2013    Social History   Tobacco Use  . Smoking status: Never Smoker  . Smokeless tobacco: Never Used  Substance Use Topics  . Alcohol use: Yes    Alcohol/week: 0.0 standard drinks    Comment: rarely    Review of Systems unable to obtain secondary to dementia; nursing-no concern other than patient's poor p.o. intake   Vitals:   03/17/19 1403  BP: (!) 90/51  Pulse: 86  Resp: 18  Temp: (!) 97.3 F (36.3 C)  SpO2: 97%   Body mass index is 14.78 kg/m. Physical Exam  GENERAL APPEARANCE: Alert, conversant, No acute distress  SKIN: No diaphoresis rash HEENT: Unremarkable RESPIRATORY: Breathing is even, unlabored. Lung sounds are clear   CARDIOVASCULAR: Heart RRR, 4/6 systolic murmur, no rubs or gallops. No peripheral edema  GASTROINTESTINAL: Abdomen is soft, non-tender, not distended w/ normal bowel sounds.  GENITOURINARY: Bladder non tender, not distended  MUSCULOSKELETAL: No abnormal joints or musculature NEUROLOGIC: Cranial nerves 2-12 grossly intact. Moves all extremities PSYCHIATRIC: Pleasantly demented, no behavioral issues  Patient Active Problem List   Diagnosis  Date Noted  . Failure to thrive in adult 01/19/2019  . Real time reverse transcriptase PCR positive for COVID-19 virus 12/22/2018  . UTI (urinary tract infection) 12/22/2018  . Sacral wound 11/06/2018  . Cellulitis of sacral region 11/06/2018  . Dementia without behavioral disturbance ([place]) 07/29/2018  . Anxiety disorder, unspecified 07/23/2017  . Iron deficiency anemia 03/29/2017  . GERD (gastroesophageal reflux disease) 12/22/2016  . Decreased hearing of both ears 08/19/2016  . Pressure injury of skin 08/15/2016  . Aspiration pneumonia ([place]) 08/14/2016  . Chronic diastolic CHF (congestive heart failure) ([place]) 08/14/2016  . Chronic right shoulder pain 02/09/2016  . Bilateral lower extremity edema 02/02/2016  . Ileitis 01/19/2016  . Pressure ulcer 01/09/2016  . SBO (small bowel obstruction) ([place]) 01/08/2016  . Narcotic addiction ([place]) 05/15/2015  . Diarrhea 03/30/2015  . Acute pharyngitis 02/26/2015  . Osteoarthritis of right knee 02/22/2015  . Pain in joint, lower leg 10/31/2014  . Ulcer of sacral region, stage 4 ([place]) 04/15/2014  . Moderate aortic stenosis 04/14/2014  . Pre-syncope 04/14/2014  . Hypotension 04/14/2014  . Acute renal failure superimposed on stage 2 chronic kidney disease ([place]) 04/14/2014  . Anemia 04/14/2014  . Protein-calorie malnutrition, severe ([place]) 04/14/2014  . Altered mental status   . Dehydration 04/13/2014  . Altered mental state 04/13/2014  . Acute kidney injury ([place])   . Hypokalemia 03/25/2014  . Syncope 03/16/2014  . Acute encephalopathy 03/16/2014  . Leg swelling 03/02/2014  . Avascular necrosis of bone of right hip ([place]) 09/27/2013  . Primary osteoarthritis of right hip 09/07/2013  . Spinal stenosis, lumbar region, with neurogenic claudication 09/07/2013  . Closed fracture of pubic ramus ([place]) 09/07/2013  . BPPV (benign paroxysmal positional vertigo) 09/07/2013  . Pharyngeal swelling 08/30/2013  . Pelvic fracture ([place]) 08/28/2013  . Fall  08/26/2013  . Dizziness 08/26/2013  . Abnormal stress test 08/26/2013  . Preop cardiovascular exam 08/01/2013  . Aortic stenosis 08/01/2013  . Edema 06/30/2013  . Loss of weight 01/04/2013  . Situational stress 01/04/2013  . Chronic right hip pain 07/05/2012  . Knee pain, acute 06/29/2012  . Vertigo 06/24/2012  . Acute exacerbation of chronic low back pain 06/24/2012  . Lumbar pain with radiation down right leg 06/09/2012  . Osteoarthritis of hip 04/29/2012  .  History of TIAs 04/17/2012  . S/P total hysterectomy and bilateral salpingo-oophorectomy 04/17/2012  . Hard of hearing 04/17/2012  . History of anemia 04/17/2012  . RLS (restless legs syndrome) 04/17/2012  . Chronic back pain 04/17/2012  . Spinal stenosis, lumbar 04/17/2012  . Lower back pain 12/20/2011  . Personal history of colonic polyps 10/08/2011  . H/O 09/14/2011  . Essential hypertension 09/14/2011  . Restless legs syndrome 09/14/2011  . Constipation 08/20/2011  . Urinary incontinence 08/20/2011    CMP     Component Value Date/Time   NA 139 12/09/2018   K 3.8 12/09/2018   CL 106 08/17/2016 0343   CO2 28 08/17/2016 0343   GLUCOSE 92 08/17/2016 0343   BUN 53 (A) 12/09/2018   CREATININE 1.1 12/09/2018   CREATININE 1.13 (H) 08/17/2016 0343   CREATININE 0.78 06/28/2014 1205   CALCIUM 7.8 (L) 08/17/2016 0343   PROT 6.6 08/14/2016 1519   ALBUMIN 3.4 (L) 08/14/2016 1519   AST 14 03/01/2018   ALT 7 03/01/2018   ALKPHOS 94 03/01/2018   BILITOT 1.1 08/14/2016 1519   GFRNONAA 40 (L) 08/17/2016 0343   GFRAA 46 (L) 08/17/2016 0343   Recent Labs    05/31/18 12/09/18  NA 143 139  K 3.6 3.8  BUN 38* 53*  CREATININE 1.1 1.1   No results for input(s): AST, ALT, ALKPHOS, BILITOT, PROT, ALBUMIN in the last 8760 hours. Recent Labs    05/31/18 12/09/18  WBC 7.6 6.4  HGB 8.4* 10.4*  HCT 24* 32*  PLT 300 278   No results for input(s): CHOL, LDLCALC, TRIG in the last 8760 hours.  Invalid input(s): HCL No  results found for: [facility] Lab Results  Component Value Date   TSH 2.02 03/01/2018   Lab Results  Component Value Date   HGBA1C 6.3 03/01/2018   Lab Results  Component Value Date   CHOL 128 03/01/2018   HDL 50 03/01/2018   LDLCALC 65 03/01/2018   TRIG 65 03/01/2018   CHOLHDL 2.8 03/06/2014    Significant Diagnostic Results in last 30 days:  No results found.  Assessment and Plan  Avascular necrosis of bone of right hip Chronic; continue MS 15 mg twice daily and oxycodone 5 mg 1 p.o. daily in the afternoon  Pressure ulcer Buttocks and right hip look good; continue wound care  Failure to thrive in adult Continues; patient now eats very little and plays with her food but is herself in no distress; continue supportive care      [name], MD

## 2019-03-18 ENCOUNTER — Encounter: Payer: Self-pay | Admitting: Internal Medicine

## 2019-03-20 ENCOUNTER — Encounter: Payer: Self-pay | Admitting: Internal Medicine

## 2019-03-20 NOTE — Assessment & Plan Note (Signed)
Chronic; continue MS 15 mg twice daily and oxycodone 5 mg 1 p.o. daily in the afternoon

## 2019-03-20 NOTE — Assessment & Plan Note (Signed)
Buttocks and right hip look good; continue wound care

## 2019-03-20 NOTE — Assessment & Plan Note (Signed)
Continues; patient now eats very little and plays with her food but is herself in no distress; continue supportive care

## 2019-04-15 ENCOUNTER — Non-Acute Institutional Stay (SKILLED_NURSING_FACILITY): Payer: Medicare Other | Admitting: Internal Medicine

## 2019-04-15 ENCOUNTER — Encounter: Payer: Self-pay | Admitting: Internal Medicine

## 2019-04-15 DIAGNOSIS — L89313 Pressure ulcer of right buttock, stage 3: Secondary | ICD-10-CM

## 2019-04-15 DIAGNOSIS — L03317 Cellulitis of buttock: Secondary | ICD-10-CM

## 2019-04-15 NOTE — Progress Notes (Signed)
Location:  [facility] Room Number: 303/D Place of Service:  SNF (31)  Shelly Duos, MD  Patient Care Team: Shelly Duos, MD as PCP - General (Internal Medicine) [name], MD as Consulting Physician (Orthopedic Surgery) [name], MD as Consulting Physician (Otolaryngology) Mast, Man X, NP as Nurse Practitioner (Internal Medicine)  Extended Emergency Contact Information Primary Emergency Contact: [name] Address: [place], [place] [address] [place] Phone: [phone] Relation: Daughter Secondary Emergency Contact: [place], [place] Mobile Phone: [phone] Relation: Son    Allergies: Amoxicillin, Baclofen, Hctz [hydrochlorothiazide], and Valium [diazepam]  Chief Complaint  Patient presents with  . Medical Management of Chronic Issues    Routine visit of medical management    HPI: Patient is 85 y.o. female who who nursing asked me to see.  Patient has 3 wounds on her buttocks area but 1, right ischial area appears infected.  There are some nonfluctuant swelling to the inferior border and some redness to the edges of the wound.  Patient did not appear in discomfort when wounds were examined and palpated.  Past Medical History:  Diagnosis Date  . Abnormal stress test 08/26/2013  . Acute encephalopathy   . AKI (acute kidney injury) ([place])   . Altered mental state   . Anemia   . Anxiety disorder, unspecified   . Aortic stenosis   . Arthritis    Back, knees  . Avascular necrosis of bone of right hip ([place]) 09/27/2013  . Bowel incontinence   . BPPV (benign paroxysmal positional vertigo) 09/07/2013  . Closed fracture of pubic ramus ([place]) 09/07/2013  . Colon polyps   . Constipation   . Decreased hearing of both ears 08/19/2016   Last Assessment & Plan:  Concern over hearing loss. Slowly progressive hearing loss.  No history of  ear surgery, trauma or infection. EXAMINATION shows normal external canals and tympanic membranes. AUDIOGRAM severe sensorineural hearing loss.  Symmetric in nature. PLAN: Reassured I do not see anything bad going on.  She is a candidate for hearing aids.   will check her benefits.  . Gastric ulcer    years ago  . History of anemia 04/17/2012  . History of TIAs 04/17/2012   June 2011   . Hypertension   . Hypokalemia   . Pelvic fracture ([place]) 08/28/2013  . Personal history of colonic polyps 10/08/2011   Index polypectomy 2013   . Restless legs syndrome    on [place]  . S/P total hysterectomy and bilateral salpingo-oophorectomy 04/17/2012  . Situational stress   . Spinal stenosis   . Spinal stenosis, lumbar 04/17/2012  . Spinal stenosis, lumbar region, with neurogenic claudication 09/07/2013  . Stroke ([place])    Mini, no residual  . TIA (transient ischemic attack)   . Ulcer causing bleeding and hole in wall of stomach or small intestine   . Urinary bladder calculus   . Urinary incontinence   . Vertigo     Past Surgical History:  Procedure Laterality Date  . COLON RESECTION  50 years ago  . LUMBAR LAMINECTOMY/DECOMPRESSION MICRODISCECTOMY  06/25/2011   Procedure: LUMBAR LAMINECTOMY/DECOMPRESSION MICRODISCECTOMY;  Surgeon: Hosie Spangle, MD;  Location: [facility];  Service: Neurosurgery;  Laterality: Right;  RIGHT Lumbar Two-Three hemilaminectomy and microdiskectomy  . TONSILLECTOMY    . TOTAL ABDOMINAL  HYSTERECTOMY W/ BILATERAL SALPINGOOPHORECTOMY  04/17/2012  . UTERINE FIBROID SURGERY      Allergies as of 04/15/2019      Reactions   Amoxicillin Other (See Comments)   Reaction unknown   Baclofen Other (See Comments)   "fuzzy in the eyes" and dizzy   Hctz [hydrochlorothiazide]    nausea   Valium [diazepam] Other (See Comments)   Pt became unresponsive and O2 sats dropped.      Medication List       Accurate as of April 15, 2019 12:01 PM. If you have any questions, ask  your nurse or doctor.        STOP taking these medications   aspirin 81 MG chewable tablet Stopped by: Inocencio Homes, MD   calcium carbonate 500 MG chewable tablet Commonly known as: TUMS - dosed in mg elemental calcium Stopped by: Inocencio Homes, MD   ferrous sulfate 325 (65 FE) MG tablet Stopped by: Inocencio Homes, MD   lubiprostone 24 MCG capsule Commonly known as: AMITIZA Stopped by: Inocencio Homes, MD   multivitamin tablet Stopped by: Inocencio Homes, MD   Myrbetriq 50 MG Tb24 tablet Generic drug: mirabegron ER Stopped by: Inocencio Homes, MD   potassium chloride 10 MEQ tablet Commonly known as: KLOR-CON Stopped by: Inocencio Homes, MD   torsemide 20 MG tablet Commonly known as: [place] by: Inocencio Homes, MD   Vitamin D3 1.25 MG (50000 UT) Caps Stopped by: Inocencio Homes, MD     TAKE these medications   acetaminophen 325 MG tablet Commonly known as: TYLENOL Take 325 mg by mouth. 2 tabs p.o. every 6 hours PRN. Notify MD if not relieved. Not to exceed 3000mg  in 24 hour period   ammonium lactate 12 % lotion Commonly known as: LAC-HYDRIN Apply 1 application topically daily. Apply to bilateral feet   atropine 1 % ophthalmic solution Place 4 drops into both eyes every 4 (four) hours as needed. Congestion @@ EOL   feeding supplement (PRO-STAT SUGAR FREE 64) Liqd Take 30 mLs by mouth 3 (three) times daily with meals.   feeding supplement Liqd Commonly known as: BOOST / RESOURCE BREEZE Take 1 Container by mouth 3 (three) times daily.   JUVEN PO Take by mouth 2 (two) times daily.   lidocaine 5 % Commonly known as: LIDODERM Place 1 patch onto the skin. APPLY 1 PATCH TO RIGHT SHOULDER EVERY 12 HOURS FOR CHRONIC PAIN (ON FOR 12 HOURS AND OFF 12 HOURS)   LORazepam 0.5 MG tablet Commonly known as: ATIVAN Take 0.5 mg by mouth every 6 (six) hours as needed for anxiety. FOR ANXIETY/AGITATION AT END OF LIFE X 60 DAYS (COMFORT CARE)   Melatonin 3 MG Tabs  Take 1 tablet by mouth at bedtime.   menthol-cetylpyridinium 3 MG lozenge Commonly known as: CEPACOL Take 1 lozenge by mouth every 2 (two) hours as needed for sore throat.   mirtazapine 15 MG tablet Commonly known as: REMERON Take 15 mg by mouth at bedtime.   morphine 15 MG 12 hr tablet Commonly known as: MS CONTIN Take 1 tablet (15 mg total) by mouth every 12 (twelve) hours.   morphine 20 MG/ML concentrated solution Commonly known as: ROXANOL Take by mouth. GIVE 0.25ML BY MOUTH OR SUBLINGUAL EVERY 4 HOURS PRN FOR PAIN/COMFORT/RESP [id number] END OF LIFE X 60 DAYS   ondansetron 4 MG tablet Commonly known as: ZOFRAN Take 4 mg by mouth every 12 (twelve) hours as needed for nausea or vomiting.   oxyCODONE 5 MG immediate  release tablet Commonly known as: Oxy IR/ROXICODONE Take 1 tablet (5 mg total) by mouth daily. At 12 pm   polyethylene glycol 17 g packet Commonly known as: MIRALAX / GLYCOLAX Take 17 g by mouth daily.   Protonix 40 MG tablet Generic drug: pantoprazole Take 40 mg by mouth daily.   rOPINIRole 4 MG tablet Commonly known as: REQUIP Take 4 mg by mouth at bedtime.   senna 8.6 MG Tabs tablet Commonly known as: SENOKOT Take 1 tablet by mouth daily.   Systane Balance 0.6 % Soln Generic drug: Propylene Glycol Place 1 drop into both eyes 2 (two) times daily.       No orders of the defined types were placed in this encounter.   Immunization History  Administered Date(s) Administered  . Influenza Split 04/15/2012  . Influenza,inj,Quad PF,6+ Mos 03/06/2014  . Influenza-Unspecified 05/01/2017, 03/12/2018, 02/24/2019  . PPD Test 08/30/2014, 09/18/2014  . Pneumococcal Conjugate-13 07/26/2017  . Pneumococcal Polysaccharide-23 08/29/2013    Social History   Tobacco Use  . Smoking status: Never Smoker  . Smokeless tobacco: Never Used  Substance Use Topics  . Alcohol use: Yes    Alcohol/week: 0.0 standard drinks    Comment: rarely    Review of  Systems    unable to obtain secondary to dementia; nursing-as per history present illness, and patient's appetite continues to be poor    Vitals:   04/15/19 1136  BP: (!) 96/51  Pulse: 94  Resp: 18  Temp: 99.2 F (37.3 C)  SpO2: 99%   Body mass index is 14.78 kg/m. Physical Exam  GENERAL APPEARANCE: Alert, No acute distress  SKIN: 3 wounds on buttocks area; right ischial wound with nonfluctuant swelling inferior border and redness with borders overall HEENT: Unremarkable RESPIRATORY: Breathing is even, unlabored. Lung sounds are clear   CARDIOVASCULAR: Heart RRR 3/6 murmur, no rubs or gallops. No peripheral edema  GASTROINTESTINAL: Abdomen is soft, non-tender, not distended w/ normal bowel sounds.  GENITOURINARY: Bladder non tender, not distended  MUSCULOSKELETAL: No abnormal joints or musculature NEUROLOGIC: Cranial nerves 2-12 grossly intact. Moves all extremities PSYCHIATRIC: Dementia, no behavioral issues  Patient Active Problem List   Diagnosis Date Noted  . Failure to thrive in adult 01/19/2019  . Real time reverse transcriptase PCR positive for COVID-19 virus 12/22/2018  . UTI (urinary tract infection) 12/22/2018  . Sacral wound 11/06/2018  . Cellulitis of sacral region 11/06/2018  . Dementia without behavioral disturbance ([place]) 07/29/2018  . Anxiety disorder, unspecified 07/23/2017  . Iron deficiency anemia 03/29/2017  . GERD (gastroesophageal reflux disease) 12/22/2016  . Decreased hearing of both ears 08/19/2016  . Pressure injury of skin 08/15/2016  . Aspiration pneumonia ([place]) 08/14/2016  . Chronic diastolic CHF (congestive heart failure) ([place]) 08/14/2016  . Chronic right shoulder pain 02/09/2016  . Bilateral lower extremity edema 02/02/2016  . Ileitis 01/19/2016  . Pressure ulcer 01/09/2016  . SBO (small bowel obstruction) ([place]) 01/08/2016  . Narcotic addiction ([place]) 05/15/2015  . Diarrhea 03/30/2015  . Acute pharyngitis 02/26/2015  . Osteoarthritis of  right knee 02/22/2015  . Pain in joint, lower leg 10/31/2014  . Ulcer of sacral region, stage 4 ([place]) 04/15/2014  . Moderate aortic stenosis 04/14/2014  . Pre-syncope 04/14/2014  . Hypotension 04/14/2014  . Acute renal failure superimposed on stage 2 chronic kidney disease ([place]) 04/14/2014  . Anemia 04/14/2014  . Protein-calorie malnutrition, severe ([place]) 04/14/2014  . Altered mental status   . Dehydration 04/13/2014  . Altered mental state 04/13/2014  .  Acute kidney injury ([place])   . Hypokalemia 03/25/2014  . Syncope 03/16/2014  . Acute encephalopathy 03/16/2014  . Leg swelling 03/02/2014  . Avascular necrosis of bone of right hip ([place]) 09/27/2013  . Primary osteoarthritis of right hip 09/07/2013  . Spinal stenosis, lumbar region, with neurogenic claudication 09/07/2013  . Closed fracture of pubic ramus ([place]) 09/07/2013  . BPPV (benign paroxysmal positional vertigo) 09/07/2013  . Pharyngeal swelling 08/30/2013  . Pelvic fracture ([place]) 08/28/2013  . Fall 08/26/2013  . Dizziness 08/26/2013  . Abnormal stress test 08/26/2013  . Preop cardiovascular exam 08/01/2013  . Aortic stenosis 08/01/2013  . Edema 06/30/2013  . Loss of weight 01/04/2013  . Situational stress 01/04/2013  . Chronic right hip pain 07/05/2012  . Knee pain, acute 06/29/2012  . Vertigo 06/24/2012  . Acute exacerbation of chronic low back pain 06/24/2012  . Lumbar pain with radiation down right leg 06/09/2012  . Osteoarthritis of hip 04/29/2012  . History of TIAs 04/17/2012  . S/P total hysterectomy and bilateral salpingo-oophorectomy 04/17/2012  . Hard of hearing 04/17/2012  . History of anemia 04/17/2012  . RLS (restless legs syndrome) 04/17/2012  . Chronic back pain 04/17/2012  . Spinal stenosis, lumbar 04/17/2012  . Lower back pain 12/20/2011  . Personal history of colonic polyps 10/08/2011  . H/O 09/14/2011  . Essential hypertension 09/14/2011  . Restless legs syndrome 09/14/2011  . Constipation  08/20/2011  . Urinary incontinence 08/20/2011    CMP     Component Value Date/Time   NA 139 12/09/2018   K 3.8 12/09/2018   CL 106 08/17/2016 0343   CO2 28 08/17/2016 0343   GLUCOSE 92 08/17/2016 0343   BUN 53 (A) 12/09/2018   CREATININE 1.1 12/09/2018   CREATININE 1.13 (H) 08/17/2016 0343   CREATININE 0.78 06/28/2014 1205   CALCIUM 7.8 (L) 08/17/2016 0343   PROT 6.6 08/14/2016 1519   ALBUMIN 3.4 (L) 08/14/2016 1519   AST 14 03/01/2018   ALT 7 03/01/2018   ALKPHOS 94 03/01/2018   BILITOT 1.1 08/14/2016 1519   GFRNONAA 40 (L) 08/17/2016 0343   GFRAA 46 (L) 08/17/2016 0343   Recent Labs    05/31/18 12/09/18  NA 143 139  K 3.6 3.8  BUN 38* 53*  CREATININE 1.1 1.1   No results for input(s): AST, ALT, ALKPHOS, BILITOT, PROT, ALBUMIN in the last 8760 hours. Recent Labs    05/31/18 12/09/18  WBC 7.6 6.4  HGB 8.4* 10.4*  HCT 24* 32*  PLT 300 278   No results for input(s): CHOL, LDLCALC, TRIG in the last 8760 hours.  Invalid input(s): HCL No results found for: [facility] Lab Results  Component Value Date   TSH 2.02 03/01/2018   Lab Results  Component Value Date   HGBA1C 6.3 03/01/2018   Lab Results  Component Value Date   CHOL 128 03/01/2018   HDL 50 03/01/2018   LDLCALC 65 03/01/2018   TRIG 65 03/01/2018   CHOLHDL 2.8 03/06/2014    Significant Diagnostic Results in last 30 days:  No results found.  Assessment and Plan  Infection right ischial wound-early; start doxycycline 1 tablets 100 mg to crush BID x10 days; monitor progress    Shelly Duos, MD

## 2019-04-17 ENCOUNTER — Encounter: Payer: Self-pay | Admitting: Internal Medicine

## 2019-05-02 ENCOUNTER — Encounter: Payer: Self-pay | Admitting: Internal Medicine

## 2019-05-02 ENCOUNTER — Non-Acute Institutional Stay (SKILLED_NURSING_FACILITY): Payer: Medicare Other | Admitting: Internal Medicine

## 2019-05-02 DIAGNOSIS — Z789 Other specified health status: Secondary | ICD-10-CM

## 2019-05-02 NOTE — Progress Notes (Signed)
Location:  [facility] Room Number: 100/P Place of Service:  SNF (31)  [name], MD  Patient Care Team: [name], MD as PCP - General (Internal Medicine) [name], MD as Consulting Physician (Orthopedic Surgery) [name], MD as Consulting Physician (Otolaryngology) Mast, Man X, NP as Nurse Practitioner (Internal Medicine)  Extended Emergency Contact Information Primary Emergency Contact: [name] Address: [place], [place] [address] [place] Phone: [phone] Relation: Daughter Secondary Emergency Contact: [place], [place] Mobile Phone: [phone] Relation: Son    Allergies: Amoxicillin, Baclofen, Hctz [hydrochlorothiazide], and Valium [diazepam]  Chief Complaint  Patient presents with  . Acute Visit    COVID Prophylaxis     HPI: Patient is 85 y.o. female who is being seen for viral prophylaxis.  Everybody in the facility has been placed on vitamin C, zinc, and vitamin D.  Past Medical History:  Diagnosis Date  . Abnormal stress test 08/26/2013  . Acute encephalopathy   . AKI (acute kidney injury) ([place])   . Altered mental state   . Anemia   . Anxiety disorder, unspecified   . Aortic stenosis   . Arthritis    Back, knees  . Avascular necrosis of bone of right hip ([place]) 09/27/2013  . Bowel incontinence   . BPPV (benign paroxysmal positional vertigo) 09/07/2013  . Closed fracture of pubic ramus ([place]) 09/07/2013  . Colon polyps   . Constipation   . Decreased hearing of both ears 08/19/2016   Last Assessment & Plan:  Concern over hearing loss. Slowly progressive hearing loss.  No history of ear surgery, trauma or infection. EXAMINATION shows normal external canals and tympanic membranes. AUDIOGRAM severe sensorineural hearing loss.  Symmetric in nature. PLAN: Reassured I do not see anything bad going on.  She is a  candidate for hearing aids.   will check her benefits.  . Gastric ulcer    years ago  . History of anemia 04/17/2012  . History of TIAs 04/17/2012   June 2011   . Hypertension   . Hypokalemia   . Pelvic fracture ([place]) 08/28/2013  . Personal history of colonic polyps 10/08/2011   Index polypectomy 2013   . Restless legs syndrome    on Requip  . S/P total hysterectomy and bilateral salpingo-oophorectomy 04/17/2012  . Situational stress   . Spinal stenosis   . Spinal stenosis, lumbar 04/17/2012  . Spinal stenosis, lumbar region, with neurogenic claudication 09/07/2013  . Stroke ([place])    Mini, no residual  . TIA (transient ischemic attack)   . Ulcer causing bleeding and hole in wall of stomach or small intestine   . Urinary bladder calculus   . Urinary incontinence   . Vertigo     Past Surgical History:  Procedure Laterality Date  . COLON RESECTION  50 years ago  . LUMBAR LAMINECTOMY/DECOMPRESSION MICRODISCECTOMY  06/25/2011   Procedure: LUMBAR LAMINECTOMY/DECOMPRESSION MICRODISCECTOMY;  Surgeon: Hosie Spangle, MD;  Location: [facility];  Service: Neurosurgery;  Laterality: Right;  RIGHT Lumbar Two-Three hemilaminectomy and microdiskectomy  . TONSILLECTOMY    . TOTAL ABDOMINAL HYSTERECTOMY W/ BILATERAL SALPINGOOPHORECTOMY  04/17/2012  . UTERINE FIBROID SURGERY      Allergies as of 05/02/2019      Reactions   Amoxicillin Other (See Comments)   Reaction unknown  Baclofen Other (See Comments)   "fuzzy in the eyes" and dizzy   Hctz [hydrochlorothiazide]    nausea   Valium [diazepam] Other (See Comments)   Pt became unresponsive and O2 sats dropped.      Medication List       Accurate as of May 02, 2019 11:59 PM. If you have any questions, ask your nurse or doctor.        acetaminophen 325 MG tablet Commonly known as: TYLENOL Take 325 mg by mouth. 2 tabs p.o. every 6 hours PRN. Notify MD if not relieved. Not to exceed 3000mg  in 24 hour period   ammonium lactate  12 % lotion Commonly known as: LAC-HYDRIN Apply 1 application topically daily. Apply to bilateral feet   atropine 1 % ophthalmic solution Place 4 drops into both eyes every 4 (four) hours as needed. Congestion @@ EOL   bisacodyl 10 MG suppository Commonly known as: DULCOLAX INSERT 1 SUPPOSITORY PER RECTUM EVERY 3 DAYS IF NO BOWEL MOVEMENT   feeding supplement (PRO-STAT SUGAR FREE 64) Liqd Take 30 mLs by mouth 3 (three) times daily with meals.   feeding supplement Liqd Commonly known as: BOOST / RESOURCE BREEZE Take 1 Container by mouth 3 (three) times daily.   JUVEN PO Take by mouth 2 (two) times daily.   lidocaine 5 % Commonly known as: LIDODERM Place 1 patch onto the skin. APPLY 1 PATCH TO RIGHT SHOULDER EVERY 12 HOURS FOR CHRONIC PAIN (ON FOR 12 HOURS AND OFF 12 HOURS)   LORazepam 0.5 MG tablet Commonly known as: ATIVAN Take 0.5 mg by mouth every 6 (six) hours as needed for anxiety. FOR ANXIETY/AGITATION AT END OF LIFE X 60 DAYS (COMFORT CARE)   Melatonin 3 MG Tabs Take 1 tablet by mouth at bedtime.   menthol-cetylpyridinium 3 MG lozenge Commonly known as: CEPACOL Take 1 lozenge by mouth every 2 (two) hours as needed for sore throat.   mirtazapine 15 MG tablet Commonly known as: REMERON Take 15 mg by mouth at bedtime.   morphine 15 MG 12 hr tablet Commonly known as: MS CONTIN Take 1 tablet (15 mg total) by mouth every 12 (twelve) hours.   morphine 20 MG/ML concentrated solution Commonly known as: ROXANOL Take by mouth. GIVE 0.25ML BY MOUTH OR SUBLINGUAL EVERY 4 HOURS PRN FOR PAIN/COMFORT/RESP [id number] END OF LIFE X 60 DAYS   ondansetron 4 MG tablet Commonly known as: ZOFRAN Take 4 mg by mouth every 12 (twelve) hours as needed for nausea or vomiting.   oxyCODONE 5 MG immediate release tablet Commonly known as: Oxy IR/ROXICODONE Take 1 tablet (5 mg total) by mouth daily. At 12 pm   polyethylene glycol 17 g packet Commonly known as: MIRALAX / GLYCOLAX Take  17 g by mouth daily.   Protonix 40 MG tablet Generic drug: pantoprazole Take 40 mg by mouth daily.   rOPINIRole 4 MG tablet Commonly known as: REQUIP Take 4 mg by mouth at bedtime.   senna 8.6 MG Tabs tablet Commonly known as: SENOKOT Take 1 tablet by mouth daily.   Systane Balance 0.6 % Soln Generic drug: Propylene Glycol Place 1 drop into both eyes 2 (two) times daily.   torsemide 5 MG tablet Commonly known as: DEMADEX Take 5 mg by mouth daily.       No orders of the defined types were placed in this encounter.   Immunization History  Administered Date(s) Administered  . Influenza Split 04/15/2012  . Influenza,inj,Quad PF,6+ Mos 03/06/2014  . Influenza-Unspecified  05/01/2017, 03/12/2018, 02/24/2019  . PPD Test 08/30/2014, 09/18/2014  . Pneumococcal Conjugate-13 07/26/2017  . Pneumococcal Polysaccharide-23 08/29/2013    Social History   Tobacco Use  . Smoking status: Never Smoker  . Smokeless tobacco: Never Used  Substance Use Topics  . Alcohol use: Yes    Alcohol/week: 0.0 standard drinks    Comment: rarely    Review of Systems   unable to obtain secondary to dementia; nursing-no acute concerns     Vitals:   05/02/19 1036  BP: (!) 121/59  Pulse: 91  Resp: 17  Temp: (!) 96.9 F (36.1 C)  SpO2: 95%   Body mass index is 14.78 kg/m. Physical Exam  GENERAL APPEARANCE: Alert, conversant, No acute distress  SKIN: No diaphoresis rash HEENT: Unremarkable RESPIRATORY: Breathing is even, unlabored. Lung sounds are clear   CARDIOVASCULAR: Heart RRR no murmurs, rubs or gallops. No peripheral edema  GASTROINTESTINAL: Abdomen is soft, non-tender, not distended w/ normal bowel sounds.  GENITOURINARY: Bladder non tender, not distended  MUSCULOSKELETAL: No abnormal joints or musculature except very thin NEUROLOGIC: Cranial nerves 2-12 grossly intact. Moves all extremities PSYCHIATRIC: Dementia, no behavioral issues  Patient Active Problem List    Diagnosis Date Noted  . Failure to thrive in adult 01/19/2019  . Real time reverse transcriptase PCR positive for COVID-19 virus 12/22/2018  . UTI (urinary tract infection) 12/22/2018  . Sacral wound 11/06/2018  . Cellulitis of sacral region 11/06/2018  . Dementia without behavioral disturbance ([place]) 07/29/2018  . Anxiety disorder, unspecified 07/23/2017  . Iron deficiency anemia 03/29/2017  . GERD (gastroesophageal reflux disease) 12/22/2016  . Decreased hearing of both ears 08/19/2016  . Pressure injury of skin 08/15/2016  . Aspiration pneumonia ([place]) 08/14/2016  . Chronic diastolic CHF (congestive heart failure) ([place]) 08/14/2016  . Chronic right shoulder pain 02/09/2016  . Bilateral lower extremity edema 02/02/2016  . Ileitis 01/19/2016  . Pressure ulcer 01/09/2016  . SBO (small bowel obstruction) ([place]) 01/08/2016  . Narcotic addiction ([place]) 05/15/2015  . Diarrhea 03/30/2015  . Acute pharyngitis 02/26/2015  . Osteoarthritis of right knee 02/22/2015  . Pain in joint, lower leg 10/31/2014  . Ulcer of sacral region, stage 4 ([place]) 04/15/2014  . Moderate aortic stenosis 04/14/2014  . Pre-syncope 04/14/2014  . Hypotension 04/14/2014  . Acute renal failure superimposed on stage 2 chronic kidney disease ([place]) 04/14/2014  . Anemia 04/14/2014  . Protein-calorie malnutrition, severe ([place]) 04/14/2014  . Altered mental status   . Dehydration 04/13/2014  . Altered mental state 04/13/2014  . Acute kidney injury ([place])   . Hypokalemia 03/25/2014  . Syncope 03/16/2014  . Acute encephalopathy 03/16/2014  . Leg swelling 03/02/2014  . Avascular necrosis of bone of right hip ([place]) 09/27/2013  . Primary osteoarthritis of right hip 09/07/2013  . Spinal stenosis, lumbar region, with neurogenic claudication 09/07/2013  . Closed fracture of pubic ramus ([place]) 09/07/2013  . BPPV (benign paroxysmal positional vertigo) 09/07/2013  . Pharyngeal swelling 08/30/2013  . Pelvic fracture ([place]) 08/28/2013   . Fall 08/26/2013  . Dizziness 08/26/2013  . Abnormal stress test 08/26/2013  . Preop cardiovascular exam 08/01/2013  . Aortic stenosis 08/01/2013  . Edema 06/30/2013  . Loss of weight 01/04/2013  . Situational stress 01/04/2013  . Chronic right hip pain 07/05/2012  . Knee pain, acute 06/29/2012  . Vertigo 06/24/2012  . Acute exacerbation of chronic low back pain 06/24/2012  . Lumbar pain with radiation down right leg 06/09/2012  . Osteoarthritis of hip 04/29/2012  . History  of TIAs 04/17/2012  . S/P total hysterectomy and bilateral salpingo-oophorectomy 04/17/2012  . Hard of hearing 04/17/2012  . History of anemia 04/17/2012  . RLS (restless legs syndrome) 04/17/2012  . Chronic back pain 04/17/2012  . Spinal stenosis, lumbar 04/17/2012  . Lower back pain 12/20/2011  . Personal history of colonic polyps 10/08/2011  . H/O 09/14/2011  . Essential hypertension 09/14/2011  . Restless legs syndrome 09/14/2011  . Constipation 08/20/2011  . Urinary incontinence 08/20/2011    CMP     Component Value Date/Time   NA 139 12/09/2018 0000   K 3.8 12/09/2018 0000   CL 106 08/17/2016 0343   CO2 28 08/17/2016 0343   GLUCOSE 92 08/17/2016 0343   BUN 53 (A) 12/09/2018 0000   CREATININE 1.1 12/09/2018 0000   CREATININE 1.13 (H) 08/17/2016 0343   CREATININE 0.78 06/28/2014 1205   CALCIUM 7.8 (L) 08/17/2016 0343   PROT 6.6 08/14/2016 1519   ALBUMIN 3.4 (L) 08/14/2016 1519   AST 14 03/01/2018 0000   ALT 7 03/01/2018 0000   ALKPHOS 94 03/01/2018 0000   BILITOT 1.1 08/14/2016 1519   GFRNONAA 40 (L) 08/17/2016 0343   GFRAA 46 (L) 08/17/2016 0343   Recent Labs    05/31/18 0000 12/09/18 0000  NA 143 139  K 3.6 3.8  BUN 38* 53*  CREATININE 1.1 1.1   No results for input(s): AST, ALT, ALKPHOS, BILITOT, PROT, ALBUMIN in the last 8760 hours. Recent Labs    05/31/18 0000 12/09/18 0000  WBC 7.6 6.4  HGB 8.4* 10.4*  HCT 24* 32*  PLT 300 278   No results for input(s): CHOL,  LDLCALC, TRIG in the last 8760 hours.  Invalid input(s): HCL No results found for: [facility] Lab Results  Component Value Date   TSH 2.02 03/01/2018   Lab Results  Component Value Date   HGBA1C 6.3 03/01/2018   Lab Results  Component Value Date   CHOL 128 03/01/2018   HDL 50 03/01/2018   LDLCALC 65 03/01/2018   TRIG 65 03/01/2018   CHOLHDL 2.8 03/06/2014    Significant Diagnostic Results in last 30 days:  No results found.  Assessment and Plan  Patient without immunity to COVID-19-patient will be placed on vitamin D 50,000 units monthly for 90 days, zinc sulfate 220 mg every other day x90 days, and vitamin C 500 mg daily x90 days.  Patient will get a vitamin D level drawn today and again in 90 days.    [name], MD

## 2019-05-02 NOTE — Progress Notes (Deleted)
Location:  [facility] Room Number: 100/P Place of Service:  SNF (31)  [name], MD  Patient Care Team: [name], MD as PCP - General (Internal Medicine) [name], MD as Consulting Physician (Orthopedic Surgery) [name], MD as Consulting Physician (Otolaryngology) Mast, Man X, NP as Nurse Practitioner (Internal Medicine)  Extended Emergency Contact Information Primary Emergency Contact: [name] Address: [place], [place] [address] [place] Phone: [phone] Relation: Daughter Secondary Emergency Contact: [place], [place] Mobile Phone: [phone] Relation: Son    Allergies: Amoxicillin, Baclofen, Hctz [hydrochlorothiazide], and Valium [diazepam]  Chief Complaint  Patient presents with  . Medical Management of Chronic Issues    Routine visit of medical management  . Immunizations    T-Dap    HPI: Patient is 85 y.o. female who   Past Medical History:  Diagnosis Date  . Abnormal stress test 08/26/2013  . Acute encephalopathy   . AKI (acute kidney injury) ([place])   . Altered mental state   . Anemia   . Anxiety disorder, unspecified   . Aortic stenosis   . Arthritis    Back, knees  . Avascular necrosis of bone of right hip ([place]) 09/27/2013  . Bowel incontinence   . BPPV (benign paroxysmal positional vertigo) 09/07/2013  . Closed fracture of pubic ramus ([place]) 09/07/2013  . Colon polyps   . Constipation   . Decreased hearing of both ears 08/19/2016   Last Assessment & Plan:  Concern over hearing loss. Slowly progressive hearing loss.  No history of ear surgery, trauma or infection. EXAMINATION shows normal external canals and tympanic membranes. AUDIOGRAM severe sensorineural hearing loss.  Symmetric in nature. PLAN: Reassured I do not see anything bad going on.  She is a candidate for hearing aids.   will check  her benefits.  . Gastric ulcer    years ago  . History of anemia 04/17/2012  . History of TIAs 04/17/2012   June 2011   . Hypertension   . Hypokalemia   . Pelvic fracture ([place]) 08/28/2013  . Personal history of colonic polyps 10/08/2011   Index polypectomy 2013   . Restless legs syndrome    on [place]  . S/P total hysterectomy and bilateral salpingo-oophorectomy 04/17/2012  . Situational stress   . Spinal stenosis   . Spinal stenosis, lumbar 04/17/2012  . Spinal stenosis, lumbar region, with neurogenic claudication 09/07/2013  . Stroke ([place])    Mini, no residual  . TIA (transient ischemic attack)   . Ulcer causing bleeding and hole in wall of stomach or small intestine   . Urinary bladder calculus   . Urinary incontinence   . Vertigo     Past Surgical History:  Procedure Laterality Date  . COLON RESECTION  50 years ago  . LUMBAR LAMINECTOMY/DECOMPRESSION MICRODISCECTOMY  06/25/2011   Procedure: LUMBAR LAMINECTOMY/DECOMPRESSION MICRODISCECTOMY;  Surgeon: Hosie Spangle, MD;  Location: [facility];  Service: Neurosurgery;  Laterality: Right;  RIGHT Lumbar Two-Three hemilaminectomy and microdiskectomy  . TONSILLECTOMY    . TOTAL ABDOMINAL HYSTERECTOMY W/ BILATERAL SALPINGOOPHORECTOMY  04/17/2012  . UTERINE FIBROID SURGERY      Allergies as of 05/02/2019      Reactions   Amoxicillin Other (See Comments)   Reaction unknown   Baclofen Other (See Comments)   "fuzzy in  the eyes" and dizzy   Hctz [hydrochlorothiazide]    nausea   Valium [diazepam] Other (See Comments)   Pt became unresponsive and O2 sats dropped.      Medication List       Accurate as of May 02, 2019 10:46 AM. If you have any questions, ask your nurse or doctor.        acetaminophen 325 MG tablet Commonly known as: TYLENOL Take 325 mg by mouth. 2 tabs p.o. every 6 hours PRN. Notify MD if not relieved. Not to exceed 3000mg  in 24 hour period   ammonium lactate 12 % lotion Commonly known as:  LAC-HYDRIN Apply 1 application topically daily. Apply to bilateral feet   atropine 1 % ophthalmic solution Place 4 drops into both eyes every 4 (four) hours as needed. Congestion @@ EOL   bisacodyl 10 MG suppository Commonly known as: DULCOLAX INSERT 1 SUPPOSITORY PER RECTUM EVERY 3 DAYS IF NO BOWEL MOVEMENT   feeding supplement (PRO-STAT SUGAR FREE 64) Liqd Take 30 mLs by mouth 3 (three) times daily with meals.   feeding supplement Liqd Commonly known as: BOOST / RESOURCE BREEZE Take 1 Container by mouth 3 (three) times daily.   JUVEN PO Take by mouth 2 (two) times daily.   lidocaine 5 % Commonly known as: LIDODERM Place 1 patch onto the skin. APPLY 1 PATCH TO RIGHT SHOULDER EVERY 12 HOURS FOR CHRONIC PAIN (ON FOR 12 HOURS AND OFF 12 HOURS)   LORazepam 0.5 MG tablet Commonly known as: ATIVAN Take 0.5 mg by mouth every 6 (six) hours as needed for anxiety. FOR ANXIETY/AGITATION AT END OF LIFE X 60 DAYS (COMFORT CARE)   Melatonin 3 MG Tabs Take 1 tablet by mouth at bedtime.   menthol-cetylpyridinium 3 MG lozenge Commonly known as: CEPACOL Take 1 lozenge by mouth every 2 (two) hours as needed for sore throat.   mirtazapine 15 MG tablet Commonly known as: REMERON Take 15 mg by mouth at bedtime.   morphine 15 MG 12 hr tablet Commonly known as: MS CONTIN Take 1 tablet (15 mg total) by mouth every 12 (twelve) hours.   morphine 20 MG/ML concentrated solution Commonly known as: ROXANOL Take by mouth. GIVE 0.25ML BY MOUTH OR SUBLINGUAL EVERY 4 HOURS PRN FOR PAIN/COMFORT/RESP [id number] END OF LIFE X 60 DAYS   ondansetron 4 MG tablet Commonly known as: ZOFRAN Take 4 mg by mouth every 12 (twelve) hours as needed for nausea or vomiting.   oxyCODONE 5 MG immediate release tablet Commonly known as: Oxy IR/ROXICODONE Take 1 tablet (5 mg total) by mouth daily. At 12 pm   polyethylene glycol 17 g packet Commonly known as: MIRALAX / GLYCOLAX Take 17 g by mouth daily.    Protonix 40 MG tablet Generic drug: pantoprazole Take 40 mg by mouth daily.   rOPINIRole 4 MG tablet Commonly known as: REQUIP Take 4 mg by mouth at bedtime.   senna 8.6 MG Tabs tablet Commonly known as: SENOKOT Take 1 tablet by mouth daily.   Systane Balance 0.6 % Soln Generic drug: Propylene Glycol Place 1 drop into both eyes 2 (two) times daily.   torsemide 5 MG tablet Commonly known as: DEMADEX Take 5 mg by mouth daily.       No orders of the defined types were placed in this encounter.   Immunization History  Administered Date(s) Administered  . Influenza Split 04/15/2012  . Influenza,inj,Quad PF,6+ Mos 03/06/2014  . Influenza-Unspecified 05/01/2017, 03/12/2018, 02/24/2019  . PPD Test 08/30/2014,  09/18/2014  . Pneumococcal Conjugate-13 07/26/2017  . Pneumococcal Polysaccharide-23 08/29/2013    Social History   Tobacco Use  . Smoking status: Never Smoker  . Smokeless tobacco: Never Used  Substance Use Topics  . Alcohol use: Yes    Alcohol/week: 0.0 standard drinks    Comment: rarely    Review of Systems  DATA OBTAINED: from patient, nurse, medical record, family member GENERAL:  no fevers, fatigue, appetite changes SKIN: No itching, rash HEENT: No complaint RESPIRATORY: No cough, wheezing, SOB CARDIAC: No chest pain, palpitations, lower extremity edema  GI: No abdominal pain, No N/V/D or constipation, No heartburn or reflux  GU: No dysuria, frequency or urgency, or incontinence  MUSCULOSKELETAL: No unrelieved bone/joint pain NEUROLOGIC: No headache, dizziness  PSYCHIATRIC: No overt anxiety or sadness  Vitals:   05/02/19 1036  BP: (!) 121/59  Pulse: 91  Resp: 17  Temp: (!) 96.9 F (36.1 C)  SpO2: 95%   Body mass index is 14.78 kg/m. Physical Exam  GENERAL APPEARANCE: Alert, conversant, No acute distress  SKIN: No diaphoresis rash HEENT: Unremarkable RESPIRATORY: Breathing is even, unlabored. Lung sounds are clear   CARDIOVASCULAR:  Heart RRR no murmurs, rubs or gallops. No peripheral edema  GASTROINTESTINAL: Abdomen is soft, non-tender, not distended w/ normal bowel sounds.  GENITOURINARY: Bladder non tender, not distended  MUSCULOSKELETAL: No abnormal joints or musculature NEUROLOGIC: Cranial nerves 2-12 grossly intact. Moves all extremities PSYCHIATRIC: Mood and affect appropriate to situation, no behavioral issues  Patient Active Problem List   Diagnosis Date Noted  . Failure to thrive in adult 01/19/2019  . Real time reverse transcriptase PCR positive for COVID-19 virus 12/22/2018  . UTI (urinary tract infection) 12/22/2018  . Sacral wound 11/06/2018  . Cellulitis of sacral region 11/06/2018  . Dementia without behavioral disturbance ([place]) 07/29/2018  . Anxiety disorder, unspecified 07/23/2017  . Iron deficiency anemia 03/29/2017  . GERD (gastroesophageal reflux disease) 12/22/2016  . Decreased hearing of both ears 08/19/2016  . Pressure injury of skin 08/15/2016  . Aspiration pneumonia ([place]) 08/14/2016  . Chronic diastolic CHF (congestive heart failure) ([place]) 08/14/2016  . Chronic right shoulder pain 02/09/2016  . Bilateral lower extremity edema 02/02/2016  . Ileitis 01/19/2016  . Pressure ulcer 01/09/2016  . SBO (small bowel obstruction) ([place]) 01/08/2016  . Narcotic addiction ([place]) 05/15/2015  . Diarrhea 03/30/2015  . Acute pharyngitis 02/26/2015  . Osteoarthritis of right knee 02/22/2015  . Pain in joint, lower leg 10/31/2014  . Ulcer of sacral region, stage 4 ([place]) 04/15/2014  . Moderate aortic stenosis 04/14/2014  . Pre-syncope 04/14/2014  . Hypotension 04/14/2014  . Acute renal failure superimposed on stage 2 chronic kidney disease ([place]) 04/14/2014  . Anemia 04/14/2014  . Protein-calorie malnutrition, severe ([place]) 04/14/2014  . Altered mental status   . Dehydration 04/13/2014  . Altered mental state 04/13/2014  . Acute kidney injury ([place])   . Hypokalemia 03/25/2014  . Syncope 03/16/2014  .  Acute encephalopathy 03/16/2014  . Leg swelling 03/02/2014  . Avascular necrosis of bone of right hip ([place]) 09/27/2013  . Primary osteoarthritis of right hip 09/07/2013  . Spinal stenosis, lumbar region, with neurogenic claudication 09/07/2013  . Closed fracture of pubic ramus ([place]) 09/07/2013  . BPPV (benign paroxysmal positional vertigo) 09/07/2013  . Pharyngeal swelling 08/30/2013  . Pelvic fracture ([place]) 08/28/2013  . Fall 08/26/2013  . Dizziness 08/26/2013  . Abnormal stress test 08/26/2013  . Preop cardiovascular exam 08/01/2013  . Aortic stenosis 08/01/2013  . Edema 06/30/2013  .  Loss of weight 01/04/2013  . Situational stress 01/04/2013  . Chronic right hip pain 07/05/2012  . Knee pain, acute 06/29/2012  . Vertigo 06/24/2012  . Acute exacerbation of chronic low back pain 06/24/2012  . Lumbar pain with radiation down right leg 06/09/2012  . Osteoarthritis of hip 04/29/2012  . History of TIAs 04/17/2012  . S/P total hysterectomy and bilateral salpingo-oophorectomy 04/17/2012  . Hard of hearing 04/17/2012  . History of anemia 04/17/2012  . RLS (restless legs syndrome) 04/17/2012  . Chronic back pain 04/17/2012  . Spinal stenosis, lumbar 04/17/2012  . Lower back pain 12/20/2011  . Personal history of colonic polyps 10/08/2011  . H/O 09/14/2011  . Essential hypertension 09/14/2011  . Restless legs syndrome 09/14/2011  . Constipation 08/20/2011  . Urinary incontinence 08/20/2011    CMP     Component Value Date/Time   NA 139 12/09/2018   K 3.8 12/09/2018   CL 106 08/17/2016 0343   CO2 28 08/17/2016 0343   GLUCOSE 92 08/17/2016 0343   BUN 53 (A) 12/09/2018   CREATININE 1.1 12/09/2018   CREATININE 1.13 (H) 08/17/2016 0343   CREATININE 0.78 06/28/2014 1205   CALCIUM 7.8 (L) 08/17/2016 0343   PROT 6.6 08/14/2016 1519   ALBUMIN 3.4 (L) 08/14/2016 1519   AST 14 03/01/2018   ALT 7 03/01/2018   ALKPHOS 94 03/01/2018   BILITOT 1.1 08/14/2016 1519   GFRNONAA 40 (L)  08/17/2016 0343   GFRAA 46 (L) 08/17/2016 0343   Recent Labs    05/31/18 12/09/18  NA 143 139  K 3.6 3.8  BUN 38* 53*  CREATININE 1.1 1.1   No results for input(s): AST, ALT, ALKPHOS, BILITOT, PROT, ALBUMIN in the last 8760 hours. Recent Labs    05/31/18 12/09/18  WBC 7.6 6.4  HGB 8.4* 10.4*  HCT 24* 32*  PLT 300 278   No results for input(s): CHOL, LDLCALC, TRIG in the last 8760 hours.  Invalid input(s): HCL No results found for: [facility] Lab Results  Component Value Date   TSH 2.02 03/01/2018   Lab Results  Component Value Date   HGBA1C 6.3 03/01/2018   Lab Results  Component Value Date   CHOL 128 03/01/2018   HDL 50 03/01/2018   LDLCALC 65 03/01/2018   TRIG 65 03/01/2018   CHOLHDL 2.8 03/06/2014    Significant Diagnostic Results in last 30 days:  No results found.  Assessment and Plan  No problem-specific Assessment & Plan notes found for this encounter.   Labs/tests ordered:    [name], MD

## 2019-05-06 ENCOUNTER — Other Ambulatory Visit: Payer: Self-pay | Admitting: Internal Medicine

## 2019-05-06 MED ORDER — OXYCODONE HCL 5 MG PO TABS: 5.0000 mg | ORAL_TABLET | Freq: Every day | ORAL | 0 refills | Status: AC

## 2019-05-06 MED ORDER — MORPHINE SULFATE ER 15 MG PO TBCR: 15.0000 mg | EXTENDED_RELEASE_TABLET | Freq: Two times a day (BID) | ORAL | 0 refills | Status: AC

## 2019-05-07 ENCOUNTER — Encounter: Payer: Self-pay | Admitting: Internal Medicine

## 2019-05-27 DEATH — deceased
